# Patient Record
Sex: Male | Born: 1955
Health system: Southern US, Community
[De-identification: ages and names within clinical notes are randomized; demographics above are authoritative.]

## PROBLEM LIST (undated history)

## (undated) DIAGNOSIS — I10 Essential (primary) hypertension: Secondary | ICD-10-CM

## (undated) DIAGNOSIS — E119 Type 2 diabetes mellitus without complications: Secondary | ICD-10-CM

## (undated) DIAGNOSIS — N189 Chronic kidney disease, unspecified: Secondary | ICD-10-CM

## (undated) DIAGNOSIS — J45909 Unspecified asthma, uncomplicated: Secondary | ICD-10-CM

## (undated) DIAGNOSIS — N186 End stage renal disease: Secondary | ICD-10-CM

## (undated) DIAGNOSIS — I509 Heart failure, unspecified: Secondary | ICD-10-CM

## (undated) HISTORY — DX: Chronic kidney disease, unspecified: N18.9

## (undated) HISTORY — PX: COLONOSCOPY: SHX174

---

## 2015-03-22 ENCOUNTER — Other Ambulatory Visit (HOSPITAL_COMMUNITY): Payer: Self-pay

## 2015-03-22 ENCOUNTER — Inpatient Hospital Stay (HOSPITAL_COMMUNITY): Payer: Medicaid Other

## 2015-03-22 ENCOUNTER — Encounter (HOSPITAL_COMMUNITY): Payer: Self-pay | Admitting: Emergency Medicine

## 2015-03-22 ENCOUNTER — Inpatient Hospital Stay (HOSPITAL_COMMUNITY)
Admission: EM | Admit: 2015-03-22 | Discharge: 2015-04-12 | DRG: 264 | Disposition: A | Discharge status: Home Health | Payer: Medicaid Other | Attending: Internal Medicine | Admitting: Internal Medicine

## 2015-03-22 DIAGNOSIS — I5033 Acute on chronic diastolic (congestive) heart failure: Secondary | ICD-10-CM | POA: Insufficient documentation

## 2015-03-22 DIAGNOSIS — E876 Hypokalemia: Secondary | ICD-10-CM | POA: Diagnosis present

## 2015-03-22 DIAGNOSIS — Z23 Encounter for immunization: Secondary | ICD-10-CM

## 2015-03-22 DIAGNOSIS — I10 Essential (primary) hypertension: Secondary | ICD-10-CM | POA: Diagnosis present

## 2015-03-22 DIAGNOSIS — D638 Anemia in other chronic diseases classified elsewhere: Secondary | ICD-10-CM | POA: Diagnosis present

## 2015-03-22 DIAGNOSIS — Z6831 Body mass index (BMI) 31.0-31.9, adult: Secondary | ICD-10-CM | POA: Diagnosis not present

## 2015-03-22 DIAGNOSIS — C9000 Multiple myeloma not having achieved remission: Secondary | ICD-10-CM | POA: Diagnosis present

## 2015-03-22 DIAGNOSIS — N049 Nephrotic syndrome with unspecified morphologic changes: Secondary | ICD-10-CM

## 2015-03-22 DIAGNOSIS — N19 Unspecified kidney failure: Secondary | ICD-10-CM | POA: Diagnosis present

## 2015-03-22 DIAGNOSIS — N269 Renal sclerosis, unspecified: Secondary | ICD-10-CM | POA: Diagnosis present

## 2015-03-22 DIAGNOSIS — I120 Hypertensive chronic kidney disease with stage 5 chronic kidney disease or end stage renal disease: Secondary | ICD-10-CM | POA: Diagnosis present

## 2015-03-22 DIAGNOSIS — N186 End stage renal disease: Secondary | ICD-10-CM | POA: Diagnosis present

## 2015-03-22 DIAGNOSIS — Z79899 Other long term (current) drug therapy: Secondary | ICD-10-CM

## 2015-03-22 DIAGNOSIS — Z794 Long term (current) use of insulin: Secondary | ICD-10-CM | POA: Diagnosis not present

## 2015-03-22 DIAGNOSIS — E1122 Type 2 diabetes mellitus with diabetic chronic kidney disease: Secondary | ICD-10-CM | POA: Diagnosis present

## 2015-03-22 DIAGNOSIS — E1129 Type 2 diabetes mellitus with other diabetic kidney complication: Secondary | ICD-10-CM

## 2015-03-22 DIAGNOSIS — R609 Edema, unspecified: Secondary | ICD-10-CM

## 2015-03-22 DIAGNOSIS — E118 Type 2 diabetes mellitus with unspecified complications: Secondary | ICD-10-CM

## 2015-03-22 DIAGNOSIS — E785 Hyperlipidemia, unspecified: Secondary | ICD-10-CM | POA: Diagnosis present

## 2015-03-22 DIAGNOSIS — J9601 Acute respiratory failure with hypoxia: Secondary | ICD-10-CM | POA: Diagnosis present

## 2015-03-22 DIAGNOSIS — N2581 Secondary hyperparathyroidism of renal origin: Secondary | ICD-10-CM | POA: Diagnosis present

## 2015-03-22 DIAGNOSIS — D509 Iron deficiency anemia, unspecified: Secondary | ICD-10-CM | POA: Diagnosis present

## 2015-03-22 DIAGNOSIS — Z992 Dependence on renal dialysis: Secondary | ICD-10-CM

## 2015-03-22 DIAGNOSIS — D649 Anemia, unspecified: Secondary | ICD-10-CM | POA: Diagnosis present

## 2015-03-22 DIAGNOSIS — N179 Acute kidney failure, unspecified: Secondary | ICD-10-CM | POA: Diagnosis present

## 2015-03-22 DIAGNOSIS — R600 Localized edema: Secondary | ICD-10-CM

## 2015-03-22 DIAGNOSIS — E119 Type 2 diabetes mellitus without complications: Secondary | ICD-10-CM

## 2015-03-22 DIAGNOSIS — R0902 Hypoxemia: Secondary | ICD-10-CM

## 2015-03-22 DIAGNOSIS — I5043 Acute on chronic combined systolic (congestive) and diastolic (congestive) heart failure: Secondary | ICD-10-CM | POA: Diagnosis present

## 2015-03-22 DIAGNOSIS — E11649 Type 2 diabetes mellitus with hypoglycemia without coma: Secondary | ICD-10-CM | POA: Diagnosis present

## 2015-03-22 DIAGNOSIS — IMO0002 Reserved for concepts with insufficient information to code with codable children: Secondary | ICD-10-CM

## 2015-03-22 DIAGNOSIS — I509 Heart failure, unspecified: Secondary | ICD-10-CM

## 2015-03-22 DIAGNOSIS — E1165 Type 2 diabetes mellitus with hyperglycemia: Secondary | ICD-10-CM

## 2015-03-22 HISTORY — DX: Essential (primary) hypertension: I10

## 2015-03-22 HISTORY — DX: Type 2 diabetes mellitus without complications: E11.9

## 2015-03-22 LAB — CBC
HCT: 24.7 % — ABNORMAL LOW (ref 39.0–52.0)
Hemoglobin: 8.1 g/dL — ABNORMAL LOW (ref 13.0–17.0)
MCH: 28.3 pg (ref 26.0–34.0)
MCHC: 32.8 g/dL (ref 30.0–36.0)
MCV: 86.4 fL (ref 78.0–100.0)
Platelets: 310 10*3/uL (ref 150–400)
RBC: 2.86 MIL/uL — AB (ref 4.22–5.81)
RDW: 14.1 % (ref 11.5–15.5)
WBC: 5.6 10*3/uL (ref 4.0–10.5)

## 2015-03-22 LAB — BASIC METABOLIC PANEL
Anion gap: 8 (ref 5–15)
BUN: 27 mg/dL — ABNORMAL HIGH (ref 6–23)
CO2: 26 mmol/L (ref 19–32)
Calcium: 7.2 mg/dL — ABNORMAL LOW (ref 8.4–10.5)
Chloride: 103 mmol/L (ref 96–112)
Creatinine, Ser: 4.38 mg/dL — ABNORMAL HIGH (ref 0.50–1.35)
GFR calc non Af Amer: 14 mL/min — ABNORMAL LOW (ref >90)
GFR, EST AFRICAN AMERICAN: 16 mL/min — AB (ref >90)
GLUCOSE: 157 mg/dL — AB (ref 70–99)
Potassium: 3 mmol/L — ABNORMAL LOW (ref 3.5–5.1)
Sodium: 137 mmol/L (ref 135–145)

## 2015-03-22 LAB — BRAIN NATRIURETIC PEPTIDE: B Natriuretic Peptide: 1043.8 pg/mL — ABNORMAL HIGH (ref 0.0–100.0)

## 2015-03-22 LAB — I-STAT TROPONIN, ED: TROPONIN I, POC: 0.02 ng/mL (ref 0.00–0.08)

## 2015-03-22 MED ORDER — GABAPENTIN 400 MG PO CAPS
400.0000 mg | ORAL_CAPSULE | Freq: Two times a day (BID) | ORAL | Status: DC
  Administered 2015-03-23 – 2015-03-30 (×16): 400 mg via ORAL
  Filled 2015-03-22 (×17): qty 1

## 2015-03-22 MED ORDER — POTASSIUM CHLORIDE CRYS ER 20 MEQ PO TBCR
40.0000 meq | EXTENDED_RELEASE_TABLET | ORAL | Status: AC
  Administered 2015-03-22 – 2015-03-23 (×2): 40 meq via ORAL
  Filled 2015-03-22 (×2): qty 2

## 2015-03-22 MED ORDER — HEPARIN SODIUM (PORCINE) 5000 UNIT/ML IJ SOLN
5000.0000 [IU] | Freq: Three times a day (TID) | INTRAMUSCULAR | Status: AC
  Administered 2015-03-23 – 2015-03-31 (×27): 5000 [IU] via SUBCUTANEOUS
  Filled 2015-03-22 (×27): qty 1

## 2015-03-22 MED ORDER — SODIUM CHLORIDE 0.9 % IJ SOLN
3.0000 mL | Freq: Two times a day (BID) | INTRAMUSCULAR | Status: DC
  Administered 2015-03-23 – 2015-04-09 (×34): 3 mL via INTRAVENOUS

## 2015-03-22 MED ORDER — PNEUMOCOCCAL VAC POLYVALENT 25 MCG/0.5ML IJ INJ
0.5000 mL | INJECTION | INTRAMUSCULAR | Status: AC
  Administered 2015-03-23: 0.5 mL via INTRAMUSCULAR
  Filled 2015-03-22: qty 0.5

## 2015-03-22 MED ORDER — METOPROLOL TARTRATE 25 MG PO TABS
25.0000 mg | ORAL_TABLET | Freq: Two times a day (BID) | ORAL | Status: DC
  Administered 2015-03-23 (×2): 25 mg via ORAL
  Filled 2015-03-22 (×4): qty 1

## 2015-03-22 MED ORDER — HYDRALAZINE HCL 20 MG/ML IJ SOLN
10.0000 mg | INTRAMUSCULAR | Status: DC | PRN
  Administered 2015-03-22 – 2015-03-23 (×3): 10 mg via INTRAVENOUS
  Filled 2015-03-22 (×3): qty 1

## 2015-03-22 MED ORDER — GABAPENTIN 800 MG PO TABS
400.0000 mg | ORAL_TABLET | Freq: Two times a day (BID) | ORAL | Status: DC
  Filled 2015-03-22: qty 0.5

## 2015-03-22 MED ORDER — INSULIN ASPART 100 UNIT/ML ~~LOC~~ SOLN
0.0000 [IU] | Freq: Three times a day (TID) | SUBCUTANEOUS | Status: DC
  Administered 2015-03-23 – 2015-03-24 (×4): 1 [IU] via SUBCUTANEOUS
  Administered 2015-03-24: 2 [IU] via SUBCUTANEOUS
  Administered 2015-03-25 – 2015-03-26 (×2): 1 [IU] via SUBCUTANEOUS
  Administered 2015-03-27 – 2015-03-28 (×2): 2 [IU] via SUBCUTANEOUS
  Administered 2015-03-28: 1 [IU] via SUBCUTANEOUS
  Administered 2015-03-29 (×2): 2 [IU] via SUBCUTANEOUS
  Administered 2015-03-29: 1 [IU] via SUBCUTANEOUS

## 2015-03-22 MED ORDER — ACETAMINOPHEN 325 MG PO TABS: 650.0000 mg | ORAL_TABLET | ORAL | Status: DC | PRN

## 2015-03-22 MED ORDER — SODIUM CHLORIDE 0.9 % IV SOLN
250.0000 mL | INTRAVENOUS | Status: DC | PRN
  Administered 2015-03-23: 250 mL via INTRAVENOUS

## 2015-03-22 MED ORDER — FUROSEMIDE 10 MG/ML IJ SOLN
40.0000 mg | Freq: Two times a day (BID) | INTRAMUSCULAR | Status: DC
  Administered 2015-03-22 – 2015-03-23 (×2): 40 mg via INTRAVENOUS
  Filled 2015-03-22 (×3): qty 4

## 2015-03-22 MED ORDER — INSULIN DETEMIR 100 UNIT/ML ~~LOC~~ SOLN
15.0000 [IU] | Freq: Two times a day (BID) | SUBCUTANEOUS | Status: DC
  Filled 2015-03-22 (×3): qty 0.15

## 2015-03-22 MED ORDER — SODIUM CHLORIDE 0.9 % IJ SOLN: 3.0000 mL | INTRAMUSCULAR | Status: DC | PRN

## 2015-03-22 MED ORDER — ONDANSETRON HCL 4 MG/2ML IJ SOLN: 4.0000 mg | Freq: Four times a day (QID) | INTRAMUSCULAR | Status: DC | PRN

## 2015-03-22 NOTE — ED Notes (Signed)
Attempted report 

## 2015-03-22 NOTE — ED Provider Notes (Signed)
CSN: [id number]     Arrival date & time 03/22/15  1835 History   None    Chief Complaint  Patient presents with  . Groin Swelling   (Consider location/radiation/quality/duration/timing/severity/associated sxs/prior Treatment) Patient is a 59 y.o. male presenting with male genitourinary complaint. The history is provided by the patient. No language interpreter was used.  Male GU Problem Presenting symptoms: scrotal pain   Presenting symptoms: no penile pain   Context: spontaneously   Relieved by:  Nothing Worsened by:  Activity Ineffective treatments:  None tried Associated symptoms: scrotal swelling and urinary frequency (since starting lasix)   Associated symptoms: no abdominal pain, no diarrhea, no fever, no flank pain, no hematuria, no nausea, no penile swelling, no urinary incontinence, no urinary retention and no vomiting   Risk factors: change in medication (started on lasix)   Risk factors: no kidney stones     Past Medical History  Diagnosis Date  . Hypertension   . Diabetes mellitus without complication    Past Surgical History  Procedure Laterality Date  . Colonoscopy     No family history on file. History  Substance Use Topics  . Smoking status: Never Smoker   . Smokeless tobacco: Not on file  . Alcohol Use: No    Review of Systems  Constitutional: Positive for fatigue. Negative for fever and diaphoresis.  Respiratory: Positive for shortness of breath (occasional on exertion). Negative for chest tightness.   Cardiovascular: Positive for leg swelling. Negative for chest pain and palpitations.  Gastrointestinal: Negative for nausea, vomiting, abdominal pain and diarrhea.  Genitourinary: Positive for frequency (since starting lasix), scrotal swelling and testicular pain. Negative for bladder incontinence, hematuria, flank pain, penile swelling, difficulty urinating and penile pain.  Neurological: Negative for light-headedness and headaches.   Psychiatric/Behavioral: Negative for confusion.  All other systems reviewed and are negative.     Allergies  Review of patient's allergies indicates no known allergies.  Home Medications   Prior to Admission medications   Not on File   BP 169/110 mmHg  Pulse 83  Temp(Src) 98.7 F (37.1 C) (Oral)  Resp 18  Ht 5\' 5"  (1.651 m)  Wt 256 lb 9 oz (116.376 kg)  BMI 42.69 kg/m2  SpO2 93% Physical Exam  Constitutional: He is oriented to person, place, and time. He appears well-developed and well-nourished. No distress.  HENT:  Head: Normocephalic and atraumatic.  Nose: Nose normal.  Mouth/Throat: Oropharynx is clear and moist. No oropharyngeal exudate.  Eyes: EOM are normal. Pupils are equal, round, and reactive to light.  Neck: Normal range of motion. Neck supple.  Cardiovascular: Normal rate, regular rhythm, normal heart sounds and intact distal pulses.   No murmur heard. Pulmonary/Chest: Effort normal and breath sounds normal. No respiratory distress. He has no wheezes. He exhibits no tenderness.  Decreased breath sounds 2/2 body habitus.   Abdominal: Soft. He exhibits no distension. There is no tenderness. There is no guarding.  Genitourinary:  Significantly enlarged/edematous bilateral scrotum.  Slightly tender to palpation diffusely, relieves with elevation.  Swelling covers the penis, but able to visualize the urethra with manipulation.  No lesions noted  Musculoskeletal: Normal range of motion. He exhibits edema (2+ pitting peripheral edema extending to scrotum ). He exhibits no tenderness.  Neurological: He is alert and oriented to person, place, and time. No cranial nerve deficit. Coordination normal.  Skin: Skin is warm and dry. He is not diaphoretic. No pallor.  Psychiatric: He has a normal mood and affect. His behavior  is normal. Judgment and thought content normal.  Nursing note and vitals reviewed.   ED Course  Procedures (including critical care time) Labs  Review Labs Reviewed  CBC - Abnormal; Notable for the following:    RBC 2.86 (*)    Hemoglobin 8.1 (*)    HCT 24.7 (*)    All other components within normal limits  BASIC METABOLIC PANEL - Abnormal; Notable for the following:    Potassium 3.0 (*)    Glucose, Bld 157 (*)    BUN 27 (*)    Creatinine, Ser 4.38 (*)    Calcium 7.2 (*)    GFR calc non Af Amer 14 (*)    GFR calc Af Amer 16 (*)    All other components within normal limits  BRAIN NATRIURETIC PEPTIDE - Abnormal; Notable for the following:    B Natriuretic Peptide 1043.8 (*)    All other components within normal limits  URINALYSIS, ROUTINE W REFLEX MICROSCOPIC - Abnormal; Notable for the following:    Color, Urine STRAW (*)    Hgb urine dipstick MODERATE (*)    Protein, ur 100 (*)    All other components within normal limits  GLUCOSE, CAPILLARY - Abnormal; Notable for the following:    Glucose-Capillary 57 (*)    All other components within normal limits  GLUCOSE, CAPILLARY - Abnormal; Notable for the following:    Glucose-Capillary 104 (*)    All other components within normal limits  URINE MICROSCOPIC-ADD ON - Abnormal; Notable for the following:    Squamous Epithelial / LPF FEW (*)    Bacteria, UA FEW (*)    Casts HYALINE CASTS (*)    All other components within normal limits  BASIC METABOLIC PANEL  HEMOGLOBIN A1C  HEPATIC FUNCTION PANEL  VITAMIN B12  FOLATE  IRON AND TIBC  FERRITIN  RETICULOCYTES  I-STAT TROPOININ, ED   Imaging Review Dg Chest 2 View  03/22/2015   CLINICAL DATA:  Hypoxia.  Hypertension and diabetes.  EXAM: CHEST  2 VIEW  COMPARISON:  None.  FINDINGS: There is mild cardiomegaly and vascular pedicle widening. Diffuse interstitial opacity with Kerley lines. Small bilateral pleural effusions with atelectasis.  IMPRESSION: CHF.   Electronically Signed   By: Monte Fantasia M.D.   On: 03/22/2015 22:51     EKG Interpretation None      MDM   Final diagnoses:  Renal failure  Hypoxia   Scrotal edema  Pt is a 59 yo M with hx of HTN, HLD, and DM who presents with scrotal pain.  Reports he has had gradually worsening bilateral leg and scrotal edema for the past few weeks.  Was seen at [facility] 2 weeks ago and was started on 20 mg lasix q day. He has been compliant with this but still has had worsening edema.  Reports he was never diagnosed with CHF, but doesn't see doctors often. Occasionally has SOB on exertion but denies acute SOB or chest pain.  Patient is morbidly obese.  Bilateral lower extremities with 2+ pitting edema.  Scrotum is the size of a mellon, diffusely tender.  Barely able to see the penis due to swelling, but still able to urinate per pt.  No lesions noted.  O2 sats in low 90s on room air but dropped into 80s occasionally, so placed on 2L O2 by [place].  Labs, EKG, and CXR were sent.   Labs returned showing no leukocytosis, mild anemia (8).  Cr 4.38.  BNP 1043 and trop  negative.  Unfortunately patient has never been seen in our system before so unable to compare his baseline.  Likely has chronic kidney disease, but is in overall poor shape with these results.   CXR with fluid overload, consistent with CHF exacerbation.   ED ECG REPORT   Date: 03/23/2015  Rate: 80  Rhythm: normal sinus rhythm  QRS Axis: normal  Intervals: normal  ST/T Wave abnormalities: normal  Conduction Disutrbances:none  Narrative Interpretation: NSR, benign EKG  Old EKG Reviewed: none available  I have personally reviewed the EKG tracing and agree with the computerized printout as noted.   Called for admission for renal failure, CHF exacerbation, and scrotal edema.  Will go to hospitalist as he is unassigned.  Floor appropriate.   Patient was seen with ED Attending, Dr. Joette Catching, MD   Tori Milks, MD 03/23/15 BP:[id number]  Virgel Manifold, MD 03/25/15 [phone]

## 2015-03-22 NOTE — ED Notes (Signed)
Attempted Report 

## 2015-03-22 NOTE — ED Notes (Signed)
Pt. Placed on 2L O2 due to oxygen saturation at 88%. Oxygen sat 96% on 2L

## 2015-03-22 NOTE — ED Notes (Signed)
Pt c/o groin swelling x 2 weeks. Pt seen at [facility] and given fluid pills but not getting any better.  Pt also reports that abdomen and legs are more swollen than usual, making it difficult to walk.

## 2015-03-22 NOTE — H&P (Signed)
History and Physical    Jadence Butterly ZOX:[id number] DOB: 04/21/1956 03/22/2015  Referring physician: Dr. Delford Field PCP: No primary care provider on file.  Specialists: none  Chief Complaint: scrotal and LE swelling  HPI: Detavius Bloxsom is a 59 y.o. male with a history of diabetes, hypertension, that usually gets his care at a clinic in [facility], presents to the ED with chief complaint of LE swelling as well as scrotal swelling. He denies history of heart failure. He endorses LE swelling ongoing and progressive for the past 4 weeks. He saw his PCP about 2 weeks ago and he was started on Lasix, however despite taking it has not noticed any improvement, in fact, he thinks his swelling got worse and decided to come to ED. He denies chest pain, denies abdominal pain, nausea/vomiting or diarrhea. He denies fevers or chills. He endorses dyspnea with ambulation which has progressively worsened. Endorses difficulties with laying flat due to dyspnea. He denies cough/chest congestion. He endorses weight gain however is unable to quantify how much. He reports good urine output. In the ED, patient was found to be in renal failure with Cr of 4, hypoxic on room air requiring 2L [place], has an elevated BNP to ~1000 and anemia.   Review of Systems: as per HPI otherwise 10 point ROS negative.   Past Medical History  Diagnosis Date  . Hypertension   . Diabetes mellitus without complication    Past Surgical History  Procedure Laterality Date  . Colonoscopy     Social History:  reports that he has never smoked. He does not have any smokeless tobacco history on file. He reports that he does not drink alcohol or use illicit drugs.  No Known Allergies  Family history negative for heart disease, heart failure, MIs  Prior to Admission medications   Medication Sig Start Date End Date Taking? Authorizing Provider  furosemide (LASIX) 20 MG tablet Take 20 mg by mouth daily.   Yes Historical Provider, MD    gabapentin (NEURONTIN) 800 MG tablet Take 400 mg by mouth 2 (two) times daily.   Yes Historical Provider, MD  insulin detemir (LEVEMIR) 100 UNIT/ML injection Inject 20 Units into the skin 2 (two) times daily.   Yes Historical Provider, MD  lisinopril (PRINIVIL,ZESTRIL) 20 MG tablet Take 20 mg by mouth daily.   Yes Historical Provider, MD  metoprolol tartrate (LOPRESSOR) 25 MG tablet Take 25 mg by mouth 2 (two) times daily.   Yes Historical Provider, MD   Physical Exam: Filed Vitals:   03/22/15 2000 03/22/15 2015 03/22/15 2045 03/22/15 2100  BP: 179/90 181/101 186/110 160/93  Pulse: 81 85 85 87  Temp:      TempSrc:      Resp:      Height:      Weight:      SpO2: 92% 96% 94% 96%     General:  No apparent distress  Eyes: PERRL, EOMI, no scleral icterus  ENT: moist oropharynx  Neck: supple, no lymphadenopathy  Cardiovascular: regular rate without MRG; 2+ peripheral pulses, + JVD, 2+ pitting peripheral edema  Respiratory: good air movement, no wheezing, bibasilar crackles  Abdomen: soft, non tender to palpation, positive bowel sounds, no guarding, no rebound  Skin: no rashes  Musculoskeletal: normal bulk and tone, no joint swelling  Psychiatric: normal mood and affect  Neurologic: non focal   Labs on Admission:  Basic Metabolic Panel:  Recent Labs Lab 03/22/15 1903  NA 137  K 3.0*  CL 103  CO2 26  GLUCOSE 157*  BUN 27*  CREATININE 4.38*  CALCIUM 7.2*   CBC:  Recent Labs Lab 03/22/15 1903  WBC 5.6  HGB 8.1*  HCT 24.7*  MCV 86.4  PLT 310   BNP (last 3 results)  Recent Labs  03/22/15 1903  BNP 1043.8*    Radiological Exams on Admission: No results found.  EKG: Independently reviewed. Sinus rhythm  Assessment/Plan Principal Problem:   CHF (congestive heart failure) Active Problems:   Peripheral edema   HTN (hypertension)   DM (diabetes mellitus)   Renal failure   Anemia   CHF exacerbation with LE swelling / scrotal swelling - will  admit on heart failure pathway, patient with LE swelling, JVD, crackles on exam and clinically in heart failure. Will obtain a 2D echo, no prior echos in the system, patient denies ever having a 2D echo done.  - daily weights - strict I&Os - Lasix 40 mg BID, monitor daily BMPs - obtain CXR, not done in the ED  Hypoxic respiratory failure - due to #1, oxygen as needed, Lasix  Renal failure - unclear baseline, patient denies prior knowledge of kidney problems, so not sure if he has underlying CKD. WIll obtain UA/Renal US. Has malignant HTN, DM, ?CHF which are good reasons for underlying CKD.  - closely monitor with diuresis, makes good urine, BUN not that high  DM - unclear control, he is on Levemir 20 U BID, will assume that renal failure is new / worse, will decrease his Levemir to 15 BID, place on carb modified diet, add SSI. Obtain A1C.  HTN - hold Lisinopril, start his home Metoprolol, IV Lasix as above, IV hydralazine PRN. Will definitely need additional oral agents, wait on echo for now.   Anemia - unclear baseline, likely due to chronic disease, dilutional component likely as well given significant fluid overload.  - obtain anemia panel    Diet: heart healthy/carb modified/low salt Fluids: none DVT Prophylaxis: heparin  Code Status: Full  Family Communication: d/w family bedside  Disposition Plan: admit to telemetry    Costin M. Elvera Lennox, MD [facility] Pager [phone]  If 7PM-7AM, please contact night-coverage [email or web address] Password [facility] 03/22/2015, 9:40 PM

## 2015-03-23 DIAGNOSIS — E876 Hypokalemia: Secondary | ICD-10-CM

## 2015-03-23 DIAGNOSIS — I10 Essential (primary) hypertension: Secondary | ICD-10-CM

## 2015-03-23 DIAGNOSIS — R609 Edema, unspecified: Secondary | ICD-10-CM

## 2015-03-23 LAB — IRON AND TIBC
IRON: 18 ug/dL — AB (ref 42–165)
Saturation Ratios: 11 % — ABNORMAL LOW (ref 20–55)
TIBC: 166 ug/dL — ABNORMAL LOW (ref 215–435)
UIBC: 148 ug/dL (ref 125–400)

## 2015-03-23 LAB — RETICULOCYTES
RBC.: 3.18 MIL/uL — AB (ref 4.22–5.81)
RETIC COUNT ABSOLUTE: 50.9 10*3/uL (ref 19.0–186.0)
Retic Ct Pct: 1.6 % (ref 0.4–3.1)

## 2015-03-23 LAB — BASIC METABOLIC PANEL
Anion gap: 11 (ref 5–15)
BUN: 26 mg/dL — ABNORMAL HIGH (ref 6–23)
CALCIUM: 7.5 mg/dL — AB (ref 8.4–10.5)
CO2: 27 mmol/L (ref 19–32)
CREATININE: 4.48 mg/dL — AB (ref 0.50–1.35)
Chloride: 103 mmol/L (ref 96–112)
GFR calc Af Amer: 15 mL/min — ABNORMAL LOW (ref >90)
GFR, EST NON AFRICAN AMERICAN: 13 mL/min — AB (ref >90)
Glucose, Bld: 114 mg/dL — ABNORMAL HIGH (ref 70–99)
POTASSIUM: 3.3 mmol/L — AB (ref 3.5–5.1)
Sodium: 141 mmol/L (ref 135–145)

## 2015-03-23 LAB — TSH: TSH: 1.254 u[IU]/mL (ref 0.350–4.500)

## 2015-03-23 LAB — URINALYSIS, ROUTINE W REFLEX MICROSCOPIC
BILIRUBIN URINE: NEGATIVE
Glucose, UA: NEGATIVE mg/dL
Ketones, ur: NEGATIVE mg/dL
Leukocytes, UA: NEGATIVE
Nitrite: NEGATIVE
Protein, ur: 100 mg/dL — AB
SPECIFIC GRAVITY, URINE: 1.009 (ref 1.005–1.030)
Urobilinogen, UA: 0.2 mg/dL (ref 0.0–1.0)
pH: 6.5 (ref 5.0–8.0)

## 2015-03-23 LAB — FOLATE: FOLATE: 6.1 ng/mL

## 2015-03-23 LAB — HEPATIC FUNCTION PANEL
ALT: 23 U/L (ref 0–53)
AST: 29 U/L (ref 0–37)
Albumin: 1.7 g/dL — ABNORMAL LOW (ref 3.5–5.2)
Alkaline Phosphatase: 159 U/L — ABNORMAL HIGH (ref 39–117)
Bilirubin, Direct: <0.1 mg/dL (ref 0.0–0.5)
Total Bilirubin: 0.6 mg/dL (ref 0.3–1.2)
Total Protein: 7.1 g/dL (ref 6.0–8.3)

## 2015-03-23 LAB — URINE MICROSCOPIC-ADD ON

## 2015-03-23 LAB — GLUCOSE, CAPILLARY
GLUCOSE-CAPILLARY: 136 mg/dL — AB (ref 70–99)
Glucose-Capillary: 104 mg/dL — ABNORMAL HIGH (ref 70–99)
Glucose-Capillary: 144 mg/dL — ABNORMAL HIGH (ref 70–99)
Glucose-Capillary: 181 mg/dL — ABNORMAL HIGH (ref 70–99)
Glucose-Capillary: 57 mg/dL — ABNORMAL LOW (ref 70–99)
Glucose-Capillary: 78 mg/dL (ref 70–99)

## 2015-03-23 LAB — VITAMIN B12: Vitamin B-12: 450 pg/mL (ref 211–911)

## 2015-03-23 LAB — MAGNESIUM: Magnesium: 1.6 mg/dL (ref 1.5–2.5)

## 2015-03-23 LAB — FERRITIN: Ferritin: 306 ng/mL (ref 22–322)

## 2015-03-23 MED ORDER — INSULIN DETEMIR 100 UNIT/ML ~~LOC~~ SOLN
10.0000 [IU] | Freq: Every day | SUBCUTANEOUS | Status: DC
  Filled 2015-03-23 (×2): qty 0.1

## 2015-03-23 MED ORDER — FUROSEMIDE 10 MG/ML IJ SOLN
80.0000 mg | Freq: Two times a day (BID) | INTRAMUSCULAR | Status: DC
  Administered 2015-03-23 – 2015-03-28 (×11): 80 mg via INTRAVENOUS
  Filled 2015-03-23 (×13): qty 8

## 2015-03-23 MED ORDER — POTASSIUM CHLORIDE CRYS ER 20 MEQ PO TBCR
40.0000 meq | EXTENDED_RELEASE_TABLET | Freq: Two times a day (BID) | ORAL | Status: AC
  Administered 2015-03-23 (×2): 40 meq via ORAL
  Filled 2015-03-23 (×2): qty 2

## 2015-03-23 MED ORDER — METOPROLOL TARTRATE 50 MG PO TABS
50.0000 mg | ORAL_TABLET | Freq: Two times a day (BID) | ORAL | Status: DC
  Administered 2015-03-23 – 2015-03-26 (×6): 50 mg via ORAL
  Filled 2015-03-23 (×7): qty 1

## 2015-03-23 MED ORDER — MAGNESIUM SULFATE 2 GM/50ML IV SOLN
2.0000 g | Freq: Once | INTRAVENOUS | Status: AC
  Administered 2015-03-23: 2 g via INTRAVENOUS
  Filled 2015-03-23 (×2): qty 50

## 2015-03-23 NOTE — Progress Notes (Signed)
[facility] PROGRESS NOTE  Aban Zajicek LKG:[id number] DOB: 07-17-1956 DOA: 03/22/2015 PCP: No primary care provider on file.  Assessment/Plan: 1. Acutely decompensated CHF exacerbation, unknown if systolic vs diastolic 1. Wt currently 111.7kg<<<116.3kg 2. Thus far net neg 925cc 3. Pt remains clinically volume overloaded 4. Pt has reported feeling better 5. 2d echo pending 6. TSH pending 7. Will increase lasix to 80mg  IV bid 2. Hypoxic respiratory failure 1. Secondary to above 2. Presently on min O2 support 3. ARF 1. Unclear etiology 2. Diuresing patient as he is clinically volume overloaded 3. Nearly 2L urine output since admission documented 4. DM2 1. Pt noted to be hypoglycemic in the setting of renal failure 2. Will decrease lantus to 10units QHS from 20 units BID 5. HTN 1. Poorly controlled 2. Increased diuretic dose 3. Increase metoprolol to 50mg  bid 6. Anemia 1. Iron deficient 2. Will follow cbc 7. Hypokalemia 1. Replace 8. Hypomagnesemia 1. Replace 9. DVT prophylaxis 1. Heparin subQ  Code Status: Full Family Communication: Pt in room (indicate person spoken with, relationship, and if by phone, the number) Disposition Plan: Pending   Consultants:    Procedures:    Antibiotics:   (indicate start date, and stop date if known)  HPI/Subjective: States feeling better  Objective: Filed Vitals:   03/23/15 0337 03/23/15 0618 03/23/15 1049 03/23/15 1142  BP: 177/90 184/98 181/98 178/90  Pulse: 85 88 92 96  Temp: 98.5 F (36.9 C)     TempSrc: Oral     Resp: 20     Height:      Weight: 111.766 kg (246 lb 6.4 oz)     SpO2: 95%       Intake/Output Summary (Last 24 hours) at 03/23/15 1400 Last data filed at 03/23/15 1102  Gross per 24 hour  Intake    900 ml  Output   1825 ml  Net   -925 ml   Filed Weights   03/22/15 1906 03/22/15 2322 03/23/15 0337  Weight: 116.376 kg (256 lb 9 oz) 111.721 kg (246 lb 4.8 oz) 111.766 kg (246 lb 6.4 oz)     Exam:   General:  Awake, in nad  Cardiovascular: regular, s1, s2  Respiratory: normal resp effort, no wheezing  Abdomen: soft, obese, nondistended  Musculoskeletal: 2-3 + pitting LE edema B with scrotal edema   Data Reviewed: Basic Metabolic Panel:  Recent Labs Lab 03/22/15 1903 03/23/15 0330 03/23/15 0727  NA 137 141  --   K 3.0* 3.3*  --   CL 103 103  --   CO2 26 27  --   GLUCOSE 157* 114*  --   BUN 27* 26*  --   CREATININE 4.38* 4.48*  --   CALCIUM 7.2* 7.5*  --   MG  --   --  1.6   Liver Function Tests:  Recent Labs Lab 03/23/15 0330  AST 29  ALT 23  ALKPHOS 159*  BILITOT 0.6  PROT 7.1  ALBUMIN 1.7*   No results for input(s): LIPASE, AMYLASE in the last 168 hours. No results for input(s): AMMONIA in the last 168 hours. CBC:  Recent Labs Lab 03/22/15 1903  WBC 5.6  HGB 8.1*  HCT 24.7*  MCV 86.4  PLT 310   Cardiac Enzymes: No results for input(s): CKTOTAL, CKMB, CKMBINDEX, TROPONINI in the last 168 hours. BNP (last 3 results)  Recent Labs  03/22/15 1903  BNP 1043.8*    ProBNP (last 3 results) No results for input(s): PROBNP in the last  8760 hours.  CBG:  Recent Labs Lab 03/22/15 2332 03/23/15 0003 03/23/15 0621 03/23/15 1118  GLUCAP 57* 104* 78 136*    No results found for this or any previous visit (from the past 240 hour(s)).   Studies: Dg Chest 2 View  03/22/2015   CLINICAL DATA:  Hypoxia.  Hypertension and diabetes.  EXAM: CHEST  2 VIEW  COMPARISON:  None.  FINDINGS: There is mild cardiomegaly and vascular pedicle widening. Diffuse interstitial opacity with Kerley lines. Small bilateral pleural effusions with atelectasis.  IMPRESSION: CHF.   Electronically Signed   By: Marnee Spring M.D.   On: 03/22/2015 22:51   US Renal  03/22/2015   CLINICAL DATA:  Renal failure.  Abdominal swelling.  Leg swelling.  EXAM: RENAL/URINARY TRACT ULTRASOUND COMPLETE  COMPARISON:  None.  FINDINGS: Right Kidney:  Length: 13.5 cm.  Echogenicity within normal limits. No mass or hydronephrosis visualized.  Left Kidney:  Length: 12.1 cm. Echogenicity within normal limits. There is a 1.4 cm cyst in the interpolar left kidney. No solid mass or hydronephrosis visualized.  Bladder:  Appears normal for degree of bladder distention.  Incidental:  Bilateral pleural effusions noted.  IMPRESSION: 1. Mild asymmetry in renal size, with right kidney mildly enlarged measuring 13.5 cm. No hydronephrosis or localizing abnormality. This may be related to mild renal hypertrophy. 2. Simple cyst in the interpolar left kidney, left kidney is otherwise normal. 3. Bilateral pleural effusions, incidentally noted.   Electronically Signed   By: Rubye Oaks M.D.   On: 03/22/2015 22:45    Scheduled Meds: . furosemide  80 mg Intravenous BID  . gabapentin  400 mg Oral BID  . heparin  5,000 Units Subcutaneous 3 times per day  . insulin aspart  0-9 Units Subcutaneous TID WC  . [START ON 03/24/2015] insulin detemir  10 Units Subcutaneous QHS  . magnesium sulfate 1 - 4 g bolus IVPB  2 g Intravenous Once  . metoprolol tartrate  50 mg Oral BID  . potassium chloride  40 mEq Oral BID  . sodium chloride  3 mL Intravenous Q12H   Continuous Infusions:   Principal Problem:   CHF (congestive heart failure) Active Problems:   Peripheral edema   HTN (hypertension)   DM (diabetes mellitus)   Renal failure   Anemia   Hypokalemia   CHIU, STEPHEN K  [facility] Pager [phone]. If 7PM-7AM, please contact night-coverage at [email or web address], password [facility] 03/23/2015, 2:00 PM  LOS: 1 day

## 2015-03-23 NOTE — Progress Notes (Signed)
CBG 57, pt did not eat in ED, gave sandwich, OJ, and graham crackers, rechecked CBG 104, will continue to monitor, thanks Arvella Nigh RN

## 2015-03-23 NOTE — Progress Notes (Signed)
  Echocardiogram 2D Echocardiogram has been performed.  James [name] 03/23/2015, 3:01 PM

## 2015-03-24 DIAGNOSIS — I5041 Acute combined systolic (congestive) and diastolic (congestive) heart failure: Secondary | ICD-10-CM

## 2015-03-24 DIAGNOSIS — I5043 Acute on chronic combined systolic (congestive) and diastolic (congestive) heart failure: Principal | ICD-10-CM

## 2015-03-24 LAB — GLUCOSE, CAPILLARY
GLUCOSE-CAPILLARY: 182 mg/dL — AB (ref 70–99)
Glucose-Capillary: 132 mg/dL — ABNORMAL HIGH (ref 70–99)
Glucose-Capillary: 134 mg/dL — ABNORMAL HIGH (ref 70–99)
Glucose-Capillary: 213 mg/dL — ABNORMAL HIGH (ref 70–99)

## 2015-03-24 LAB — BASIC METABOLIC PANEL
Anion gap: 9 (ref 5–15)
BUN: 27 mg/dL — ABNORMAL HIGH (ref 6–23)
CHLORIDE: 105 mmol/L (ref 96–112)
CO2: 28 mmol/L (ref 19–32)
Calcium: 7.5 mg/dL — ABNORMAL LOW (ref 8.4–10.5)
Creatinine, Ser: 4.54 mg/dL — ABNORMAL HIGH (ref 0.50–1.35)
GFR calc Af Amer: 15 mL/min — ABNORMAL LOW (ref >90)
GFR calc non Af Amer: 13 mL/min — ABNORMAL LOW (ref >90)
GLUCOSE: 136 mg/dL — AB (ref 70–99)
POTASSIUM: 4.3 mmol/L (ref 3.5–5.1)
SODIUM: 142 mmol/L (ref 135–145)

## 2015-03-24 LAB — MAGNESIUM: MAGNESIUM: 1.9 mg/dL (ref 1.5–2.5)

## 2015-03-24 LAB — HEMOGLOBIN A1C
Hgb A1c MFr Bld: 7.2 % — ABNORMAL HIGH (ref 4.8–5.6)
Mean Plasma Glucose: 160 mg/dL

## 2015-03-24 MED ORDER — HYDRALAZINE HCL 25 MG PO TABS
25.0000 mg | ORAL_TABLET | Freq: Three times a day (TID) | ORAL | Status: DC
  Administered 2015-03-24 – 2015-03-26 (×6): 25 mg via ORAL
  Filled 2015-03-24 (×8): qty 1

## 2015-03-24 MED ORDER — ISOSORBIDE MONONITRATE ER 30 MG PO TB24
30.0000 mg | ORAL_TABLET | Freq: Every day | ORAL | Status: DC
  Administered 2015-03-24 – 2015-03-31 (×8): 30 mg via ORAL
  Filled 2015-03-24 (×8): qty 1

## 2015-03-24 MED ORDER — INSULIN DETEMIR 100 UNIT/ML ~~LOC~~ SOLN
12.0000 [IU] | Freq: Every day | SUBCUTANEOUS | Status: DC
  Administered 2015-03-24 – 2015-04-07 (×15): 12 [IU] via SUBCUTANEOUS
  Filled 2015-03-24 (×16): qty 0.12

## 2015-03-24 NOTE — Progress Notes (Signed)
[facility] PROGRESS NOTE  James [name] NFA:[id number] DOB: 12/26/1955 DOA: 03/22/2015 PCP: No primary care provider on file.  Assessment/Plan: 1. Acutely decompensated systolic/diastolic CHF exacerbation 1. Wt currently 110.8kg<<<116.3kg 2. Thus far net neg 2.47L since admit 3. Pt remains clinically volume overloaded with marked anasarca 4. Pt has reported feeling better to day with improved orthopnea and decreased LE swelling 5. 2d echo with EF 45-50% with LV diastolic dysfuntion 6. TSH normal 7. Tolerating lasix at 80mg  IV bid 2. Hypoxic respiratory failure 1. Secondary to above 2. Presently on min O2 support 3. ARF 1. Unclear etiology 2. Diuresing patient as he is clinically volume overloaded 3. Continues with good urine output with lasix 4. Continue to monitor renal function closely 4. DM2 1. Initially noted to be hypoglycemic in the setting of renal failure 2. Decreased lantus to 10units QHS from 20 units BID 3. Glucose ranges from 130-180's 4. Will increase levemir  to 12 units 5. Cont SSI coverage 5. HTN 1. Poorly controlled 2. Increased diuretic dose 3. Increased metoprolol to 50mg  bid 4. Isosorbide and hydralazine added per Cardiology 6. Anemia 1. Iron deficient 2. Will follow cbc 7. Hypokalemia 1. Replaced 8. Hypomagnesemia 1. Replaced 9. DVT prophylaxis 1. Heparin subQ  Code Status: Full Family Communication: Pt in room, wife and daughter at bedside Disposition Plan: Pending   Consultants:  Cardiology  Procedures:    Antibiotics:   (indicate start date, and stop date if known)  HPI/Subjective: Feels better today. Less SOB   Objective: Filed Vitals:   03/23/15 2056 03/24/15 0611 03/24/15 0612 03/24/15 1033  BP: 175/95 180/91 171/90 165/97  Pulse: 94 94  89  Temp: 98.6 F (37 C) 99.3 F (37.4 C)    TempSrc: Oral Oral    Resp: 18 16    Height:      Weight:  110.859 kg (244 lb 6.4 oz)    SpO2: 98% 97%      Intake/Output  Summary (Last 24 hours) at 03/24/15 1338 Last data filed at 03/24/15 1328  Gross per 24 hour  Intake 1204.67 ml  Output   2450 ml  Net -1245.33 ml   Filed Weights   03/22/15 2322 03/23/15 0337 03/24/15 0611  Weight: 111.721 kg (246 lb 4.8 oz) 111.766 kg (246 lb 6.4 oz) 110.859 kg (244 lb 6.4 oz)    Exam:   General:  Awake, in nad  Cardiovascular: regular, s1, s2  Respiratory: normal resp effort, no wheezing  Abdomen: soft, obese, nondistended, pos BS  Musculoskeletal: 2 + pitting LE edema B with scrotal edema that appears improved  Data Reviewed: Basic Metabolic Panel:  Recent Labs Lab 03/22/15 1903 03/23/15 0330 03/23/15 0727 03/24/15 0411 03/24/15 0800  NA 137 141  --  142  --   K 3.0* 3.3*  --  4.3  --   CL 103 103  --  105  --   CO2 26 27  --  28  --   GLUCOSE 157* 114*  --  136*  --   BUN 27* 26*  --  27*  --   CREATININE 4.38* 4.48*  --  4.54*  --   CALCIUM 7.2* 7.5*  --  7.5*  --   MG  --   --  1.6  --  1.9   Liver Function Tests:  Recent Labs Lab 03/23/15 0330  AST 29  ALT 23  ALKPHOS 159*  BILITOT 0.6  PROT 7.1  ALBUMIN 1.7*   No results for input(s):  LIPASE, AMYLASE in the last 168 hours. No results for input(s): AMMONIA in the last 168 hours. CBC:  Recent Labs Lab 03/22/15 1903  WBC 5.6  HGB 8.1*  HCT 24.7*  MCV 86.4  PLT 310   Cardiac Enzymes: No results for input(s): CKTOTAL, CKMB, CKMBINDEX, TROPONINI in the last 168 hours. BNP (last 3 results)  Recent Labs  03/22/15 1903  BNP 1043.8*    ProBNP (last 3 results) No results for input(s): PROBNP in the last 8760 hours.  CBG:  Recent Labs Lab 03/23/15 1118 03/23/15 1625 03/23/15 2056 03/24/15 0608 03/24/15 1128  GLUCAP 136* 144* 181* 132* 182*    No results found for this or any previous visit (from the past 240 hour(s)).   Studies: Dg Chest 2 View  03/22/2015   CLINICAL DATA:  Hypoxia.  Hypertension and diabetes.  EXAM: CHEST  2 VIEW  COMPARISON:  None.   FINDINGS: There is mild cardiomegaly and vascular pedicle widening. Diffuse interstitial opacity with Kerley lines. Small bilateral pleural effusions with atelectasis.  IMPRESSION: CHF.   Electronically Signed   By: Marnee Spring M.D.   On: 03/22/2015 22:51   US Renal  03/22/2015   CLINICAL DATA:  Renal failure.  Abdominal swelling.  Leg swelling.  EXAM: RENAL/URINARY TRACT ULTRASOUND COMPLETE  COMPARISON:  None.  FINDINGS: Right Kidney:  Length: 13.5 cm. Echogenicity within normal limits. No mass or hydronephrosis visualized.  Left Kidney:  Length: 12.1 cm. Echogenicity within normal limits. There is a 1.4 cm cyst in the interpolar left kidney. No solid mass or hydronephrosis visualized.  Bladder:  Appears normal for degree of bladder distention.  Incidental:  Bilateral pleural effusions noted.  IMPRESSION: 1. Mild asymmetry in renal size, with right kidney mildly enlarged measuring 13.5 cm. No hydronephrosis or localizing abnormality. This may be related to mild renal hypertrophy. 2. Simple cyst in the interpolar left kidney, left kidney is otherwise normal. 3. Bilateral pleural effusions, incidentally noted.   Electronically Signed   By: Rubye Oaks M.D.   On: 03/22/2015 22:45    Scheduled Meds: . furosemide  80 mg Intravenous BID  . gabapentin  400 mg Oral BID  . heparin  5,000 Units Subcutaneous 3 times per day  . hydrALAZINE  25 mg Oral TID  . insulin aspart  0-9 Units Subcutaneous TID WC  . insulin detemir  12 Units Subcutaneous QHS  . isosorbide mononitrate  30 mg Oral Daily  . metoprolol tartrate  50 mg Oral BID  . sodium chloride  3 mL Intravenous Q12H   Continuous Infusions:   Principal Problem:   CHF (congestive heart failure) Active Problems:   Peripheral edema   HTN (hypertension)   DM (diabetes mellitus)   Renal failure   Anemia   Hypokalemia   James [name] K  [facility] Pager [phone]. If 7PM-7AM, please contact night-coverage at [email or web address], password  [facility] 03/24/2015, 1:38 PM  LOS: 2 days

## 2015-03-24 NOTE — Consult Note (Signed)
CARDIOLOGY CONSULT NOTE  Patient ID: James [name], MRN: [id number], DOB/AGE: 57-21-1957 57 y.o. Admit date: 03/22/2015 Date of Consult: 03/24/2015  Primary Physician: No primary care provider on file. Primary Cardiologist: none Referring Physician: Dr Rhona Leavens  Chief Complaint: Swelling/shortness of breath Reason for Consultation: CHF  HPI: 59 year old gentleman admitted 4/16 with decompensated heart failure. The patient has no history of cardiac disease. He reports 4-6 weeks of worsening leg and scrotal swelling. Over the last 2 weeks he has developed orthopnea and exertional dyspnea. He denies chest pain, chest pressure, or PND. He was seen in the [facility] health clinic and given oral diuretics. He did not improve over 2 weeks and decided to come to the emergency department for evaluation.  On admission, the patient's creatinine was greater than 4 mg/dL. He has not been told of any kidney problems in the past. He has been given IV diuretics and reports improvement in his shortness of breath and swelling over the last 48 hours.  The patient works in a factory. He frequently left boxes that weigh about 70 pounds. He does not smoke cigarettes or drink alcohol.  Medical History:  Past Medical History  Diagnosis Date  . Hypertension   . Diabetes mellitus without complication       Surgical History:  Past Surgical History  Procedure Laterality Date  . Colonoscopy       Home Meds: Prior to Admission medications   Medication Sig Start Date End Date Taking? Authorizing Provider  furosemide (LASIX) 20 MG tablet Take 20 mg by mouth daily.   Yes Historical Provider, MD  gabapentin (NEURONTIN) 800 MG tablet Take 400 mg by mouth 2 (two) times daily.   Yes Historical Provider, MD  insulin detemir (LEVEMIR) 100 UNIT/ML injection Inject 20 Units into the skin 2 (two) times daily.   Yes Historical Provider, MD  lisinopril (PRINIVIL,ZESTRIL) 20 MG tablet Take 20 mg by mouth daily.   Yes  Historical Provider, MD  metoprolol tartrate (LOPRESSOR) 25 MG tablet Take 25 mg by mouth 2 (two) times daily.   Yes Historical Provider, MD    Inpatient Medications:  . furosemide  80 mg Intravenous BID  . gabapentin  400 mg Oral BID  . heparin  5,000 Units Subcutaneous 3 times per day  . insulin aspart  0-9 Units Subcutaneous TID WC  . insulin detemir  10 Units Subcutaneous QHS  . metoprolol tartrate  50 mg Oral BID  . sodium chloride  3 mL Intravenous Q12H      Allergies: No Known Allergies  History   Social History  . Marital Status: Single    Spouse Name: N/A  . Number of Children: N/A  . Years of Education: N/A   Occupational History  . Not on file.   Social History Main Topics  . Smoking status: Never Smoker   . Smokeless tobacco: Not on file  . Alcohol Use: No  . Drug Use: No  . Sexual Activity: Not on file   Other Topics Concern  . Not on file   Social History Narrative  . No narrative on file    Family history: Negative for premature CAD and congestive heart failure  Review of Systems: General: negative for chills, fever, night sweats or weight changes.  ENT: negative for rhinorrhea or epistaxis Cardiovascular: See history of present illness Dermatological: negative for rash Respiratory: negative for cough or wheezing GI: negative for nausea, vomiting, diarrhea, bright red blood per rectum, melena, or hematemesis GU: no  hematuria, urgency, or frequency Neurologic: negative for visual changes, syncope, headache, or dizziness Heme: no easy bruising or bleeding Endo: negative for excessive thirst, thyroid disorder, or flushing. Positive for diabetes Musculoskeletal: negative for joint pain or swelling, negative for myalgias  All other systems reviewed and are otherwise negative except as noted above.  Physical Exam: Blood pressure 165/97, pulse 89, temperature 99.3 F (37.4 C), temperature source Oral, resp. rate 16, height 5\' 5"  (1.651 m), weight  244 lb 6.4 oz (110.859 kg), SpO2 97 %. Pt is alert and oriented, WD, WN, pleasant obese male in no distress. HEENT: normal Neck: JVP elevated. Carotid upstrokes normal without bruits. No thyromegaly. Lungs: equal expansion, rales in both bases CV: Apex is nonpalpable, RRR without murmur or gallop Abd: soft, NT, +BS, no bruit, no hepatosplenomegaly Back: no CVA tenderness Ext: Diffuse 3+ edema extending up to the thighs bilaterally        DP/PT pulses intact and = GU: Marked Scrotal edema is present Skin: warm and dry without rash Neuro: CNII-XII intact             Strength intact = bilaterally    Labs: No results for input(s): CKTOTAL, CKMB, TROPONINI in the last 72 hours. Lab Results  Component Value Date   WBC 5.6 03/22/2015   HGB 8.1* 03/22/2015   HCT 24.7* 03/22/2015   MCV 86.4 03/22/2015   PLT 310 03/22/2015    Recent Labs Lab 03/23/15 0330 03/24/15 0411  NA 141 142  K 3.3* 4.3  CL 103 105  CO2 27 28  BUN 26* 27*  CREATININE 4.48* 4.54*  CALCIUM 7.5* 7.5*  PROT 7.1  --   BILITOT 0.6  --   ALKPHOS 159*  --   ALT 23  --   AST 29  --   GLUCOSE 114* 136*   No results found for: CHOL, HDL, LDLCALC, TRIG No results found for: DDIMER  Radiology/Studies:  Dg Chest 2 View  03/22/2015   CLINICAL DATA:  Hypoxia.  Hypertension and diabetes.  EXAM: CHEST  2 VIEW  COMPARISON:  None.  FINDINGS: There is mild cardiomegaly and vascular pedicle widening. Diffuse interstitial opacity with Kerley lines. Small bilateral pleural effusions with atelectasis.  IMPRESSION: CHF.   Electronically Signed   By: Marnee Spring M.D.   On: 03/22/2015 22:51   US Renal  03/22/2015   CLINICAL DATA:  Renal failure.  Abdominal swelling.  Leg swelling.  EXAM: RENAL/URINARY TRACT ULTRASOUND COMPLETE  COMPARISON:  None.  FINDINGS: Right Kidney:  Length: 13.5 cm. Echogenicity within normal limits. No mass or hydronephrosis visualized.  Left Kidney:  Length: 12.1 cm. Echogenicity within normal limits.  There is a 1.4 cm cyst in the interpolar left kidney. No solid mass or hydronephrosis visualized.  Bladder:  Appears normal for degree of bladder distention.  Incidental:  Bilateral pleural effusions noted.  IMPRESSION: 1. Mild asymmetry in renal size, with right kidney mildly enlarged measuring 13.5 cm. No hydronephrosis or localizing abnormality. This may be related to mild renal hypertrophy. 2. Simple cyst in the interpolar left kidney, left kidney is otherwise normal. 3. Bilateral pleural effusions, incidentally noted.   Electronically Signed   By: Rubye Oaks M.D.   On: 03/22/2015 22:45    EKG: Normal sinus rhythm with possible age-indeterminate anteroseptal MI  Cardiac Studies: 2D Echo: Study Conclusions  - Left ventricle: Decrease thickening of the inferolateral segments and the base inferior segment. EF is 45-50%. The cavity size was normal. Wall thickness was  increased in a pattern of moderate to severe LVH. Findings consistent with left ventricular diastolic dysfunction. - Left atrium: The atrium was mildly dilated. - Right ventricle: The cavity size was normal. Systolic function was normal. - Pericardium, extracardiac: A trivial pericardial effusion was identified posterior to the heart. - Impressions: Large pleural effusion.  Impressions:  - Large pleural effusion.  ASSESSMENT AND PLAN:  1. Acute decompensated systolic and diastolic heart failure, New York Heart Association functional class IV 2. Probable acute kidney injury on chronic kidney disease. Currently with GFR approximately 15 3. Type 2 diabetes with complications as outlined above 4. Hypertension 5. Anemia, suspect chronic disease 6. Left pleural effusion  The patient's clinical exam, echo, radiographic data, and lab findings (BNP greater than 1000) are all suggestive of decompensated heart failure. The patient has anasarca and his heart failure is complicated by advanced renal disease of  uncertain chronicity. I agree that treatment includes IV diuresis, with holding ACE inhibitor's and any other nephrotoxic agents, and sodium restriction. Considering his ongoing hypertension, would add isosorbide and hydralazine to his medical regimen. He appears to be diuresing well on furosemide 80 mg IV twice daily. I suspect he will require several more days in the hospital as he is markedly volume overloaded. Our team will follow along with you. The patient is not a candidate for invasive cardiac evaluation because of his advanced renal disease and risk of further kidney injury. I spent an extensive amount of time discussing lifestyle modification necessary in the ongoing management of his congestive heart failure. He understands the need to restrict fluids and sodium, check daily weights, and adhere to medications.  Signed, Tonny Bollman MD, [facility] 03/24/2015, 11:59 AM

## 2015-03-24 NOTE — Care Management Note (Unsigned)
    Page 1 of 1   04/02/2015     12:03:32 PM CARE MANAGEMENT NOTE 04/02/2015  Patient:  James [name], James [name]   Account Number:  [id number]  Date Initiated:  03/24/2015  Documentation initiated by:  Vince Ainsley  Subjective/Objective Assessment:   Pt adm on 03/22/15 with CHF exacerbation.  PTA, pt resides at home with brother.     Action/Plan:   Will follow for dc needs as pt progresses.  Pt has no insurance, and has PCP at [facility].   Anticipated DC Date:  04/07/2015   Anticipated DC Plan:  [address]  CM consult      Choice offered to / List presented to:             Status of service:  In process, will continue to follow Medicare Important Message given?  NO (If response is "NO", the following Medicare IM given date fields will be blank) Date Medicare IM given:   Medicare IM given by:   Date Additional Medicare IM given:   Additional Medicare IM given by:    Discharge Disposition:    Per UR Regulation:  Reviewed for med. necessity/level of care/duration of stay  If discussed at [place] of Stay Meetings, dates discussed:   03/27/2015  04/01/2015    Comments:  04/02/15 Ellan Lambert, RN, BSN [phone] Pt for renal and bone marrow biopsies today.  Crea 6.04 today; may need HD in near future.  Cont to follow progress.  03/28/15 Ellan Lambert, RN, BSN [phone] Pt may benefit from [facility] at dc for disease management of CHF.

## 2015-03-25 DIAGNOSIS — I5023 Acute on chronic systolic (congestive) heart failure: Secondary | ICD-10-CM

## 2015-03-25 DIAGNOSIS — I509 Heart failure, unspecified: Secondary | ICD-10-CM | POA: Insufficient documentation

## 2015-03-25 LAB — BASIC METABOLIC PANEL
ANION GAP: 7 (ref 5–15)
BUN: 29 mg/dL — AB (ref 6–23)
CHLORIDE: 104 mmol/L (ref 96–112)
CO2: 28 mmol/L (ref 19–32)
Calcium: 7.6 mg/dL — ABNORMAL LOW (ref 8.4–10.5)
Creatinine, Ser: 4.59 mg/dL — ABNORMAL HIGH (ref 0.50–1.35)
GFR calc Af Amer: 15 mL/min — ABNORMAL LOW (ref >90)
GFR calc non Af Amer: 13 mL/min — ABNORMAL LOW (ref >90)
Glucose, Bld: 122 mg/dL — ABNORMAL HIGH (ref 70–99)
Potassium: 4.1 mmol/L (ref 3.5–5.1)
Sodium: 139 mmol/L (ref 135–145)

## 2015-03-25 LAB — GLUCOSE, CAPILLARY
GLUCOSE-CAPILLARY: 106 mg/dL — AB (ref 70–99)
GLUCOSE-CAPILLARY: 186 mg/dL — AB (ref 70–99)
Glucose-Capillary: 103 mg/dL — ABNORMAL HIGH (ref 70–99)
Glucose-Capillary: 149 mg/dL — ABNORMAL HIGH (ref 70–99)

## 2015-03-25 NOTE — Clinical Documentation Improvement (Signed)
Current notes reflect "hypoxic respiratory failure", oxygen 2L via [place], acute on chronic combined systolic and diastolic heart failure.  Please provide the acuity of the respiratory failure (acute, chronic, acute on chronic) and document in your progress note and carry over to the discharge summary.    Thank you, Mateo Flow, RN [phone] Clinical Documentation Specialist

## 2015-03-25 NOTE — Progress Notes (Signed)
Subjective: No CP  Breathihng and swelling are getting better   Objective: Filed Vitals:   03/24/15 1508 03/24/15 1540 03/24/15 2111 03/25/15 0621  BP: 130/77 151/81 153/82 142/70  Pulse: 74 82 86 87  Temp: 98.4 F (36.9 C)  98.4 F (36.9 C) 98.5 F (36.9 C)  TempSrc: Oral  Oral Oral  Resp: 18  16 17   Height:      Weight:    244 lb 1.6 oz (110.723 kg)  SpO2: 99%  96% 90%   Weight change: -4.8 oz (-0.136 kg)  Intake/Output Summary (Last 24 hours) at 03/25/15 0743 Last data filed at 03/25/15 [id number]  Gross per 24 hour  Intake   1410 ml  Output   2150 ml  Net   -740 ml    General: Alert, awake, oriented x3, in no acute distress Neck:  JVP is increased Heart: Regular rate and rhythm, without murmurs, rubs, gallops.  Lungs: Clear to auscultation.  No rales or wheezes. Exemities:  1-2+ edema.   Neuro: Grossly intact, nonfocal.  Neg neg 3.87 L    Lab Results: Results for orders placed or performed during the hospital encounter of 03/22/15 (from the past 24 hour(s))  Magnesium     Status: None   Collection Time: 03/24/15  8:00 AM  Result Value Ref Range   Magnesium 1.9 1.5 - 2.5 mg/dL  Glucose, capillary     Status: Abnormal   Collection Time: 03/24/15 11:28 AM  Result Value Ref Range   Glucose-Capillary 182 (H) 70 - 99 mg/dL   Comment 1 Notify RN   Glucose, capillary     Status: Abnormal   Collection Time: 03/24/15  4:50 PM  Result Value Ref Range   Glucose-Capillary 134 (H) 70 - 99 mg/dL   Comment 1 Notify RN   Glucose, capillary     Status: Abnormal   Collection Time: 03/24/15  9:09 PM  Result Value Ref Range   Glucose-Capillary 213 (H) 70 - 99 mg/dL   Comment 1 Notify RN    Comment 2 Document in Chart   Basic metabolic panel     Status: Abnormal   Collection Time: 03/25/15  4:57 AM  Result Value Ref Range   Sodium 139 135 - 145 mmol/L   Potassium 4.1 3.5 - 5.1 mmol/L   Chloride 104 96 - 112 mmol/L   CO2 28 19 - 32 mmol/L   Glucose, Bld 122 (H) 70 - 99  mg/dL   BUN 29 (H) 6 - 23 mg/dL   Creatinine, Ser 4.59 (H) 0.50 - 1.35 mg/dL   Calcium 7.6 (L) 8.4 - 10.5 mg/dL   GFR calc non Af Amer 13 (L) >90 mL/min   GFR calc Af Amer 15 (L) >90 mL/min   Anion gap 7 5 - 15  Glucose, capillary     Status: Abnormal   Collection Time: 03/25/15  5:47 AM  Result Value Ref Range   Glucose-Capillary 103 (H) 70 - 99 mg/dL    Studies/Results: No results found.  Medications:REviewed    @PROBHOSP @  1  Acute on chronic systolic/diastolic CHF  Still with volume increase on exam  Patinet admits to eating salty food and drinking more than he should  Will behave differently now   COntinue diuresis    2.  CKD  Follow   He is rsponding to lasix.    3.  HTN  Imiproved      LOS: 3 days   Dorris Carnes 03/25/2015, 7:43 AM

## 2015-03-25 NOTE — Progress Notes (Signed)
Heart Failure Navigator Consult Note  Presentation: James [name] is a 59 y.o. male with a history of diabetes, hypertension, that usually gets his care at a clinic in [facility], presents to the ED with chief complaint of LE swelling as well as scrotal swelling. He denies history of heart failure. He endorses LE swelling ongoing and progressive for the past 4 weeks. He saw his PCP about 2 weeks ago and he was started on Lasix, however despite taking it has not noticed any improvement, in fact, he thinks his swelling got worse and decided to come to ED. He denies chest pain, denies abdominal pain, nausea/vomiting or diarrhea. He denies fevers or chills. He endorses dyspnea with ambulation which has progressively worsened. Endorses difficulties with laying flat due to dyspnea. He denies cough/chest congestion. He endorses weight gain however is unable to quantify how much. He reports good urine output. In the ED, patient was found to be in renal failure with Cr of 4, hypoxic on room air requiring 2L [place], has an elevated BNP to ~1000 and anemia.    Past Medical History  Diagnosis Date  . Hypertension   . Diabetes mellitus without complication     History   Social History  . Marital Status: Single    Spouse Name: N/A  . Number of Children: N/A  . Years of Education: N/A   Social History Main Topics  . Smoking status: Never Smoker   . Smokeless tobacco: Not on file  . Alcohol Use: No  . Drug Use: No  . Sexual Activity: Not on file   Other Topics Concern  . None   Social History Narrative  . None    ECHO:Study Conclusions-03/23/15  - Left ventricle: Decrease thickening of the inferolateral segments and the base inferior segment. EF is 45-50%. The cavity size was normal. Wall thickness was increased in a pattern of moderate to severe LVH. Findings consistent with left ventricular diastolic dysfunction. - Left atrium: The atrium was mildly dilated. - Right ventricle:  The cavity size was normal. Systolic function was normal. - Pericardium, extracardiac: A trivial pericardial effusion was identified posterior to the heart. - Impressions: Large pleural effusion.  Impressions:  - Large pleural effusion.  Transthoracic echocardiography. M-mode, complete 2D, spectral Doppler, and color Doppler. Birthdate: Patient birthdate: 11-14-1956. Age: Patient is 59 yr old. Sex: Gender: male. BMI: 40.9 kg/m^2. Blood pressure:   181/98 Patient status: Inpatient. Study date: Study date: 03/23/2015. Study time: 05:38 PM. Location: Bedside.  BNP    Component Value Date/Time   BNP 1043.8* 03/22/2015 1903    ProBNP No results found for: PROBNP   Education Assessment and Provision:  Detailed education and instructions provided on heart failure disease management including the following:  Signs and symptoms of Heart Failure When to call the physician Importance of daily weights Low sodium diet Fluid restriction Medication management Anticipated future follow-up appointments  Patient education given on each of the above topics.  Patient acknowledges understanding and acceptance of all instructions.  I spoke with Mr. Dewit regarding his HF.  He acknowledges that he has received HF education prior.   He does have a scale and says he "normally" weighs everyday.  I reviewed the importance of daily weights and how the weights relate to signs symptoms of HF.  He can teach back importance of a low sodium diet and I reinforced high sodium foods to avoid.  He says that he can get and take prescribed medications with no issue.  He  lives with his mother and says his brother is there "sometimes".  Education Materials:  "Living Better With Heart Failure" Booklet, Daily Weight Tracker Tool    High Risk Criteria for Readmission and/or Poor Patient Outcomes:   EF <30%- No 45-50%  2 or more admissions in 6 months- No  Difficult social situation-  No  Demonstrates medication noncompliance- No   Barriers of Care:  Knowledge and compliance   Discharge Planning:   Plans to return home with mother and brother.  He would benefit from [facility] for ongoing education, compliance reinforcement and symptom recognition.

## 2015-03-25 NOTE — Progress Notes (Addendum)
[facility] PROGRESS NOTE  James [name] OZH:[id number] DOB: 17-Feb-1956 DOA: 03/22/2015 PCP: No primary care provider on file.  Off Service Summary:. 59yo who presented with scrotal swelling and generalized edema. The patient was found to have ARF and evidence of heart failure. The patient has since been continued on IV lasix with good diuresis thus far. Cardiology is following.  Assessment/Plan: 1. Acutely decompensated systolic/diastolic CHF exacerbation 1. Wt currently 110.7kg<<<116.3kg 2. Thus far net neg 3.37L since admit 3. Pt remains clinically volume overloaded with marked anasarca, albeit improving 4. Pt with improved orthopnea and decreased LE swelling 5. 2d echo with EF 45-50% with LV diastolic dysfuntion 6. TSH normal 7. Tolerating lasix at 80mg  IV bid 8. Cardiology following 9. Will continue 2. Hypoxic respiratory failure 1. Secondary to above 2. Presently on min O2 support 3. ARF 1. Unclear etiology 2. Diuresing patient as he is clinically volume overloaded 3. Tolerating IV lasix thus far 4. Continue to monitor renal function closely 4. DM2 1. Initially noted to be hypoglycemic in the setting of renal failure 2. Glucose ranges from 100-200's 3. On levemir at 12 units 4. Cont SSI coverage 5. HTN 1. Increased diuretic dose 2. Increased metoprolol to 50mg  bid 3. Isosorbide and hydralazine added per Cardiology 4. BP improved 6. Anemia 1. Iron deficient 2. Will follow cbc 7. Hypokalemia 1. Replaced 8. Hypomagnesemia 1. Replaced 9. DVT prophylaxis 1. Heparin subQ  Code Status: Full Family Communication: Pt in room Disposition Plan: Pending   Consultants:  Cardiology  Procedures:    Antibiotics:  none  HPI/Subjective: Pt reports less swelling today. States feeling lighter on his feet   Objective: Filed Vitals:   03/24/15 1540 03/24/15 2111 03/25/15 0621 03/25/15 1028  BP: 151/81 153/82 142/70 168/102  Pulse: 82 86 87 87  Temp:  98.4  F (36.9 C) 98.5 F (36.9 C)   TempSrc:  Oral Oral   Resp:  16 17   Height:      Weight:   110.723 kg (244 lb 1.6 oz)   SpO2:  96% 90%     Intake/Output Summary (Last 24 hours) at 03/25/15 1200 Last data filed at 03/25/15 1033  Gross per 24 hour  Intake   1250 ml  Output   2150 ml  Net   -900 ml   Filed Weights   03/23/15 0337 03/24/15 0611 03/25/15 0621  Weight: 111.766 kg (246 lb 6.4 oz) 110.859 kg (244 lb 6.4 oz) 110.723 kg (244 lb 1.6 oz)    Exam:   General:  Awake, sitting in bed, in nad  Cardiovascular: regular, s1, s2  Respiratory: normal resp effort, no wheezing, decreased BS  Abdomen: soft, obese, nondistended, pos BS  Musculoskeletal: 2 + pitting LE edema B with scrotal edema that appears improved  Data Reviewed: Basic Metabolic Panel:  Recent Labs Lab 03/22/15 1903 03/23/15 0330 03/23/15 0727 03/24/15 0411 03/24/15 0800 03/25/15 0457  NA 137 141  --  142  --  139  K 3.0* 3.3*  --  4.3  --  4.1  CL 103 103  --  105  --  104  CO2 26 27  --  28  --  28  GLUCOSE 157* 114*  --  136*  --  122*  BUN 27* 26*  --  27*  --  29*  CREATININE 4.38* 4.48*  --  4.54*  --  4.59*  CALCIUM 7.2* 7.5*  --  7.5*  --  7.6*  MG  --   --  1.6  --  1.9  --    Liver Function Tests:  Recent Labs Lab 03/23/15 0330  AST 29  ALT 23  ALKPHOS 159*  BILITOT 0.6  PROT 7.1  ALBUMIN 1.7*   No results for input(s): LIPASE, AMYLASE in the last 168 hours. No results for input(s): AMMONIA in the last 168 hours. CBC:  Recent Labs Lab 03/22/15 1903  WBC 5.6  HGB 8.1*  HCT 24.7*  MCV 86.4  PLT 310   Cardiac Enzymes: No results for input(s): CKTOTAL, CKMB, CKMBINDEX, TROPONINI in the last 168 hours. BNP (last 3 results)  Recent Labs  03/22/15 1903  BNP 1043.8*    ProBNP (last 3 results) No results for input(s): PROBNP in the last 8760 hours.  CBG:  Recent Labs Lab 03/24/15 0608 03/24/15 1128 03/24/15 1650 03/24/15 2109 03/25/15 0547  GLUCAP 132*  182* 134* 213* 103*    No results found for this or any previous visit (from the past 240 hour(s)).   Studies: No results found.  Scheduled Meds: . furosemide  80 mg Intravenous BID  . gabapentin  400 mg Oral BID  . heparin  5,000 Units Subcutaneous 3 times per day  . hydrALAZINE  25 mg Oral TID  . insulin aspart  0-9 Units Subcutaneous TID WC  . insulin detemir  12 Units Subcutaneous QHS  . isosorbide mononitrate  30 mg Oral Daily  . metoprolol tartrate  50 mg Oral BID  . sodium chloride  3 mL Intravenous Q12H   Continuous Infusions:   Principal Problem:   CHF (congestive heart failure) Active Problems:   Peripheral edema   HTN (hypertension)   DM (diabetes mellitus)   Renal failure   Anemia   Hypokalemia   Suzanne Garbers K  [facility] Pager [phone]. If 7PM-7AM, please contact night-coverage at [email or web address], password [email or web address] 03/25/2015, 12:00 PM  LOS: 3 days

## 2015-03-26 DIAGNOSIS — R0902 Hypoxemia: Secondary | ICD-10-CM

## 2015-03-26 DIAGNOSIS — I5020 Unspecified systolic (congestive) heart failure: Secondary | ICD-10-CM

## 2015-03-26 LAB — BASIC METABOLIC PANEL
ANION GAP: 7 (ref 5–15)
BUN: 30 mg/dL — ABNORMAL HIGH (ref 6–23)
CALCIUM: 7.7 mg/dL — AB (ref 8.4–10.5)
CO2: 30 mmol/L (ref 19–32)
Chloride: 101 mmol/L (ref 96–112)
Creatinine, Ser: 4.66 mg/dL — ABNORMAL HIGH (ref 0.50–1.35)
GFR calc Af Amer: 15 mL/min — ABNORMAL LOW (ref >90)
GFR, EST NON AFRICAN AMERICAN: 13 mL/min — AB (ref >90)
Glucose, Bld: 120 mg/dL — ABNORMAL HIGH (ref 70–99)
Potassium: 4.2 mmol/L (ref 3.5–5.1)
Sodium: 138 mmol/L (ref 135–145)

## 2015-03-26 LAB — GLUCOSE, CAPILLARY
GLUCOSE-CAPILLARY: 150 mg/dL — AB (ref 70–99)
Glucose-Capillary: 88 mg/dL (ref 70–99)
Glucose-Capillary: 117 mg/dL — ABNORMAL HIGH (ref 70–99)
Glucose-Capillary: 205 mg/dL — ABNORMAL HIGH (ref 70–99)

## 2015-03-26 LAB — CK: Total CK: 380 U/L — ABNORMAL HIGH (ref 7–232)

## 2015-03-26 MED ORDER — HYDRALAZINE HCL 50 MG PO TABS
50.0000 mg | ORAL_TABLET | Freq: Three times a day (TID) | ORAL | Status: DC
  Administered 2015-03-26 – 2015-03-28 (×6): 50 mg via ORAL
  Filled 2015-03-26 (×8): qty 1

## 2015-03-26 MED ORDER — METOPROLOL TARTRATE 50 MG PO TABS: 75.0000 mg | ORAL_TABLET | Freq: Two times a day (BID) | ORAL | Status: DC

## 2015-03-26 MED ORDER — METOPROLOL TARTRATE 50 MG PO TABS
50.0000 mg | ORAL_TABLET | Freq: Two times a day (BID) | ORAL | Status: DC
  Administered 2015-03-26 – 2015-03-27 (×2): 50 mg via ORAL
  Filled 2015-03-26 (×3): qty 1

## 2015-03-26 MED ORDER — FERROUS SULFATE 325 (65 FE) MG PO TABS
325.0000 mg | ORAL_TABLET | Freq: Every day | ORAL | Status: DC
  Administered 2015-03-27 – 2015-03-29 (×3): 325 mg via ORAL
  Filled 2015-03-26 (×4): qty 1

## 2015-03-26 NOTE — Consult Note (Signed)
Reason for Consult:ARF Referring Physician: Short  James [name] is an 59 y.o. male.  HPI: Pt is a 59yo M with PMH sig for DM, HTN, CKD stage 2-3 (baseline Scr was 1.26 in January 2014 and 2015) who presented to [facility] on 03/22/15 with a 4 week h/o worsening lower extremity and scrotal edema.  He contacted his PCP (the [employer] [facility]) and started him on lasix 2 weeks ago without significant improvement.  He was found to have acute decompensated systolic and diastolic CHF and was started on IV lasix with signfiicant improvement with diuresis, however his Scr has increased over the last few days.  He reports that he had been taking lisinopril prior to admission and denies any knowledge of CKD, nor any family history of CKD.  We were asked to help further evaluate and manage his AKI vs. Subacute kidney disease.   Trend in Creatinine: CREATININE, SER  Date/Time Value Ref Range Status  03/26/2015 03:24 AM 4.66* 0.50 - 1.35 mg/dL Final  03/25/2015 04:57 AM 4.59* 0.50 - 1.35 mg/dL Final  03/24/2015 04:11 AM 4.54* 0.50 - 1.35 mg/dL Final  03/23/2015 03:30 AM 4.48* 0.50 - 1.35 mg/dL Final  03/22/2015 07:03 PM 4.38* 0.50 - 1.35 mg/dL Final  12/10/2013 1.26    12/11/2012 1.25      PMH:   Past Medical History  Diagnosis Date  . Hypertension   . Diabetes mellitus without complication     PSH:   Past Surgical History  Procedure Laterality Date  . Colonoscopy      Allergies: No Known Allergies  Medications:   Prior to Admission medications   Medication Sig Start Date End Date Taking? Authorizing Provider  furosemide (LASIX) 20 MG tablet Take 20 mg by mouth daily.   Yes Historical Provider, MD  gabapentin (NEURONTIN) 800 MG tablet Take 400 mg by mouth 2 (two) times daily.   Yes Historical Provider, MD  insulin detemir (LEVEMIR) 100 UNIT/ML injection Inject 20 Units into the skin 2 (two) times daily.   Yes Historical Provider, MD  lisinopril (PRINIVIL,ZESTRIL) 20 MG tablet Take  20 mg by mouth daily.   Yes Historical Provider, MD  metoprolol tartrate (LOPRESSOR) 25 MG tablet Take 25 mg by mouth 2 (two) times daily.   Yes Historical Provider, MD    Inpatient medications: . furosemide  80 mg Intravenous BID  . gabapentin  400 mg Oral BID  . heparin  5,000 Units Subcutaneous 3 times per day  . hydrALAZINE  50 mg Oral TID  . insulin aspart  0-9 Units Subcutaneous TID WC  . insulin detemir  12 Units Subcutaneous QHS  . isosorbide mononitrate  30 mg Oral Daily  . metoprolol tartrate  50 mg Oral BID  . sodium chloride  3 mL Intravenous Q12H    Discontinued Meds:   Medications Discontinued During This Encounter  Medication Reason  . gabapentin (NEURONTIN) tablet 400 mg Formulary change  . furosemide (LASIX) injection 40 mg   . insulin detemir (LEVEMIR) injection 15 Units   . metoprolol tartrate (LOPRESSOR) tablet 25 mg   . insulin detemir (LEVEMIR) injection 10 Units   . metoprolol (LOPRESSOR) tablet 50 mg   . hydrALAZINE (APRESOLINE) tablet 25 mg   . metoprolol tartrate (LOPRESSOR) tablet 75 mg     Social History:  reports that he has never smoked. He does not have any smokeless tobacco history on file. He reports that he does not drink alcohol or use illicit drugs.  Family History:  No family history on file.  Pertinent items are noted in HPI. Weight change: -0.816 kg (-1 lb 12.8 oz)  Intake/Output Summary (Last 24 hours) at 03/26/15 1528 Last data filed at 03/26/15 1410  Gross per 24 hour  Intake   1060 ml  Output   1600 ml  Net   -540 ml   BP 151/80 mmHg  Pulse 80  Temp(Src) 98.2 F (36.8 C) (Oral)  Resp 18  Ht 5\' 5"  (1.651 m)  Wt 109.907 kg (242 lb 4.8 oz)  BMI 40.32 kg/m2  SpO2 100% Filed Vitals:   03/25/15 1510 03/25/15 2204 03/26/15 0553 03/26/15 1300  BP: 117/58 150/76 152/85 151/80  Pulse: 78 82 84 80  Temp:  99.7 F (37.6 C) 98.8 F (37.1 C) 98.2 F (36.8 C)  TempSrc:  Oral Oral Oral  Resp:  20 18 18   Height:      Weight:    109.907 kg (242 lb 4.8 oz)   SpO2:  96% 99% 100%     General appearance: alert, cooperative and no distress Head: Normocephalic, without obvious abnormality, atraumatic Neck: no adenopathy, no carotid bruit, supple, symmetrical, trachea midline and thyroid not enlarged, symmetric, no tenderness/mass/nodules Resp: clear to auscultation bilaterally Cardio: regular rate and rhythm and no rub GI: soft, non-tender; bowel sounds normal; no masses,  no organomegaly Extremities: edema 3+ pitting lower extremity and scrotal edema  Labs: Basic Metabolic Panel:  Recent Labs Lab 03/22/15 1903 03/23/15 0330 03/24/15 0411 03/25/15 0457 03/26/15 0324  NA 137 141 142 139 138  K 3.0* 3.3* 4.3 4.1 4.2  CL 103 103 105 104 101  CO2 26 27 28 28 30   GLUCOSE 157* 114* 136* 122* 120*  BUN 27* 26* 27* 29* 30*  CREATININE 4.38* 4.48* 4.54* 4.59* 4.66*  ALBUMIN  --  1.7*  --   --   --   CALCIUM 7.2* 7.5* 7.5* 7.6* 7.7*   Liver Function Tests:  Recent Labs Lab 03/23/15 0330  AST 29  ALT 23  ALKPHOS 159*  BILITOT 0.6  PROT 7.1  ALBUMIN 1.7*   No results for input(s): LIPASE, AMYLASE in the last 168 hours. No results for input(s): AMMONIA in the last 168 hours. CBC:  Recent Labs Lab 03/22/15 1903  WBC 5.6  HGB 8.1*  HCT 24.7*  MCV 86.4  PLT 310   PT/INR: @LABRCNTIP (inr:5) Cardiac Enzymes: )No results for input(s): CKTOTAL, CKMB, CKMBINDEX, TROPONINI in the last 168 hours. CBG:  Recent Labs Lab 03/25/15 1214 03/25/15 1706 03/25/15 2202 03/26/15 0608 03/26/15 1128  GLUCAP 106* 149* 186* 88 117*    Iron Studies:  Recent Labs Lab 03/23/15 0330  IRON 18*  TIBC 166*  FERRITIN 306    Xrays/Other Studies: No results found.   Assessment/Plan: 1. Acute vs subacute kidney injury/CKD- last known Scr was 1.26 on 12/10/13 and Hgb was 12.7 at that time.  Now with increase in Scr in setting of decompensated diastolic and systolic CHF with diuresis.  Will warrant further work up  and possible biopsy depending on 24 hour urine protein and serologies.  No indication for dialysis at this time. 2. Acute decompensated diastolic and systolic congestive heart failure- management per cardiology.  Diuresing well.  Cont with lasix 3. HTN- not at goal.  Would increase beta-blocker due to HR in 80's 4. LVH- as above.  Hold ACE/ARB for now 5. Anemia- possibly due to CKD (although his Scr was only 1.26 15 months ago and hgb 12.7 12/10/13).  Will  check SPEP/UPEP, guaiac stools and serologies and iron stores. 6. Hypoalbuminemia- his albumin was 2.8 on 12/10/14 and now <2.  R/o nephrotic syndrome (although spot urine with only 100mg  protein)   Wonda Goodgame A 03/26/2015, 3:28 PM

## 2015-03-26 NOTE — Progress Notes (Signed)
[facility] PROGRESS NOTE  James [name] VHQ:[id number] DOB: 06/02/1956 DOA: 03/22/2015 PCP: No primary care provider on file.  Brief Summary  59yo who presented with scrotal swelling and generalized edema. The patient was found to have  Elevated creatinine and evidence of heart failure.   CArdiology was consulted.The patient was started on IV lasix.   Her creatinine started to rise on 4/20.  Assessment/Plan   acute on chronic systolic and diastolic congestive heart failure -   Weight currently 109.9 kg down from 116 kg at admission -   -530 mL over last 24 hours -   2-D echo with ejection fraction 45-50% with LV diastolic dysfunction -   TSH within normal limits -   Lasix held this afternoon secondary to rising creatinine -   Appreciate cardiology assistance -   Telemetry: Sinus tachycardia   Acute kidney injury,  Baseline creatinine of 1.2 , currently 4.6 -   I personally called  [facility] and [place]. The patient's son creatinine was 1.2 in January 2015 but his creatinine was 3.5 in February 2016. He has never seen a nephrologist. -   Urinalysis demonstrated some hemoglobin and 3-6 RBCs and some hyaline casts , 100 protein -   Nephrology consultation -    Minimize nephrotoxins and renally dose medications -   He has not received any IV contrast recently -   ACE inhibitor has been discontinued  -   Urine protein to creatinine ratio, 24-hour urine protein, IFE ordered by nephrology  Diabetes mellitus type 2 with renal complications , CBGs moderately controlled -   Continue Levemir 12 units with sliding scale insulin   Hypertension , blood pressure still elevated -   Metoprolol 50 mg by mouth twice a day -   Increase hydralazine (done by cardiology) -   Continue imdur -   ACEI held due to AKI  Iron deficiency anemia and probable anemia of chronic kidney disease -   Recommend iron supplementation and follow up with  gastroenterology -  Folate and b12 wnl  Diet:   2 g sodium Access:   PIV IVF:   off Proph:   heparin  Code Status:  full Family Communication:  Patient alone Disposition Plan:  Pending further evaluation for progressive kidney failure   Consultants:   cardiology    nephrology  Procedures:   echo  Antibiotics:   none   HPI/Subjective:   states that his swelling is improved. He denies shortness of breath or chest pain.    Objective: Filed Vitals:   03/25/15 1510 03/25/15 2204 03/26/15 0553 03/26/15 1300  BP: 117/58 150/76 152/85 151/80  Pulse: 78 82 84 80  Temp:  99.7 F (37.6 C) 98.8 F (37.1 C) 98.2 F (36.8 C)  TempSrc:  Oral Oral Oral  Resp:  20 18 18   Height:      Weight:   109.907 kg (242 lb 4.8 oz)   SpO2:  96% 99% 100%    Intake/Output Summary (Last 24 hours) at 03/26/15 1717 Last data filed at 03/26/15 1410  Gross per 24 hour  Intake    840 ml  Output   1300 ml  Net   -460 ml   Filed Weights   03/24/15 0611 03/25/15 0621 03/26/15 0553  Weight: 110.859 kg (244 lb 6.4 oz) 110.723 kg (244 lb 1.6 oz) 109.907 kg (242 lb 4.8 oz)    Exam:   General:   Obese male,No acute distress  HEENT:  NCAT, MMM  Cardiovascular:  RRR, nl S1, S2 no mrg, 2+ pulses, warm extremities  Respiratory:  CTAB, no increased WOB  Abdomen:   NABS, soft, NT/ND  MSK:   Normal tone and bulk, no 2+ slow pitting bilateral LEE  Neuro:  Grossly intact  Data Reviewed: Basic Metabolic Panel:  Recent Labs Lab 03/22/15 1903 03/23/15 0330 03/23/15 0727 03/24/15 0411 03/24/15 0800 03/25/15 0457 03/26/15 0324  NA 137 141  --  142  --  139 138  K 3.0* 3.3*  --  4.3  --  4.1 4.2  CL 103 103  --  105  --  104 101  CO2 26 27  --  28  --  28 30  GLUCOSE 157* 114*  --  136*  --  122* 120*  BUN 27* 26*  --  27*  --  29* 30*  CREATININE 4.38* 4.48*  --  4.54*  --  4.59* 4.66*  CALCIUM 7.2* 7.5*  --  7.5*  --  7.6* 7.7*  MG  --   --  1.6  --  1.9  --   --    Liver  Function Tests:  Recent Labs Lab 03/23/15 0330  AST 29  ALT 23  ALKPHOS 159*  BILITOT 0.6  PROT 7.1  ALBUMIN 1.7*   No results for input(s): LIPASE, AMYLASE in the last 168 hours. No results for input(s): AMMONIA in the last 168 hours. CBC:  Recent Labs Lab 03/22/15 1903  WBC 5.6  HGB 8.1*  HCT 24.7*  MCV 86.4  PLT 310   Cardiac Enzymes: No results for input(s): CKTOTAL, CKMB, CKMBINDEX, TROPONINI in the last 168 hours. BNP (last 3 results)  Recent Labs  03/22/15 1903  BNP 1043.8*    ProBNP (last 3 results) No results for input(s): PROBNP in the last 8760 hours.  CBG:  Recent Labs Lab 03/25/15 1706 03/25/15 2202 03/26/15 0608 03/26/15 1128 03/26/15 1641  GLUCAP 149* 186* 88 117* 150*    No results found for this or any previous visit (from the past 240 hour(s)).   Studies: No results found.  Scheduled Meds: . furosemide  80 mg Intravenous BID  . gabapentin  400 mg Oral BID  . heparin  5,000 Units Subcutaneous 3 times per day  . hydrALAZINE  50 mg Oral TID  . insulin aspart  0-9 Units Subcutaneous TID WC  . insulin detemir  12 Units Subcutaneous QHS  . isosorbide mononitrate  30 mg Oral Daily  . metoprolol tartrate  50 mg Oral BID  . sodium chloride  3 mL Intravenous Q12H   Continuous Infusions:   Principal Problem:   CHF (congestive heart failure) Active Problems:   Peripheral edema   HTN (hypertension)   DM (diabetes mellitus)   Renal failure   Anemia   Hypokalemia   Congestive heart disease    Time spent: 30 min    Prerna Harold, [facility]  [facility] Pager [phone]. If 7PM-7AM, please contact night-coverage at [email or web address], password [email or web address] 03/26/2015, 5:17 PM  LOS: 4 days

## 2015-03-26 NOTE — Progress Notes (Signed)
   Subjective: Breating getting better  No CP  Laying in bed   Objective: Filed Vitals:   03/25/15 1028 03/25/15 1510 03/25/15 2204 03/26/15 0553  BP: 168/102 117/58 150/76 152/85  Pulse: 87 78 82 84  Temp:   99.7 F (37.6 C) 98.8 F (37.1 C)  TempSrc:   Oral Oral  Resp:   20 18  Height:      Weight:    242 lb 4.8 oz (109.907 kg)  SpO2:   96% 99%   Weight change: -1 lb 12.8 oz (-0.816 kg)  Intake/Output Summary (Last 24 hours) at 03/26/15 0735 Last data filed at 03/26/15 0600  Gross per 24 hour  Intake   1570 ml  Output   1800 ml  Net   -230 ml   I/O   -4.4 L   General: Alert, awake, oriented x3, in no acute distress Neck:  JVP is normal Heart: Regular rate and rhythm, without murmurs, rubs, gallops.  Lungs: Clear to auscultation.  No rales or wheezes. Exemities:  1-2+ edema.   Neuro: Grossly intact, nonfocal.  Tele:  SR  80s   Lab Results: Results for orders placed or performed during the hospital encounter of 03/22/15 (from the past 24 hour(s))  Glucose, capillary     Status: Abnormal   Collection Time: 03/25/15 12:14 PM  Result Value Ref Range   Glucose-Capillary 106 (H) 70 - 99 mg/dL  Glucose, capillary     Status: Abnormal   Collection Time: 03/25/15  5:06 PM  Result Value Ref Range   Glucose-Capillary 149 (H) 70 - 99 mg/dL   Comment 1 Notify RN   Glucose, capillary     Status: Abnormal   Collection Time: 03/25/15 10:02 PM  Result Value Ref Range   Glucose-Capillary 186 (H) 70 - 99 mg/dL  Basic metabolic panel     Status: Abnormal   Collection Time: 03/26/15  3:24 AM  Result Value Ref Range   Sodium 138 135 - 145 mmol/L   Potassium 4.2 3.5 - 5.1 mmol/L   Chloride 101 96 - 112 mmol/L   CO2 30 19 - 32 mmol/L   Glucose, Bld 120 (H) 70 - 99 mg/dL   BUN 30 (H) 6 - 23 mg/dL   Creatinine, Ser 4.66 (H) 0.50 - 1.35 mg/dL   Calcium 7.7 (L) 8.4 - 10.5 mg/dL   GFR calc non Af Amer 13 (L) >90 mL/min   GFR calc Af Amer 15 (L) >90 mL/min   Anion gap 7 5 - 15    Glucose, capillary     Status: None   Collection Time: 03/26/15  6:08 AM  Result Value Ref Range   Glucose-Capillary 88 70 - 99 mg/dL    Studies/Results: No results found.  Medications: Reivewed    @PROBHOSP @  1  Acute on chronic systolic CHF  Volume is improiving Patinet more comfortable  Hold PM dose lasix  Follow Cr   2 CKD  Not sure who has followed patient Would prob contact    3  HTN  WOuld increase hydralazine to 50 tid    LOS: 4 days   Dorris Carnes 03/26/2015, 7:35 AM

## 2015-03-27 DIAGNOSIS — I5033 Acute on chronic diastolic (congestive) heart failure: Secondary | ICD-10-CM | POA: Insufficient documentation

## 2015-03-27 DIAGNOSIS — I5031 Acute diastolic (congestive) heart failure: Secondary | ICD-10-CM

## 2015-03-27 DIAGNOSIS — N179 Acute kidney failure, unspecified: Secondary | ICD-10-CM

## 2015-03-27 DIAGNOSIS — I5030 Unspecified diastolic (congestive) heart failure: Secondary | ICD-10-CM

## 2015-03-27 LAB — RENAL FUNCTION PANEL
ANION GAP: 8 (ref 5–15)
Albumin: 1.7 g/dL — ABNORMAL LOW (ref 3.5–5.2)
BUN: 33 mg/dL — AB (ref 6–23)
CO2: 30 mmol/L (ref 19–32)
Calcium: 8 mg/dL — ABNORMAL LOW (ref 8.4–10.5)
Chloride: 101 mmol/L (ref 96–112)
Creatinine, Ser: 4.62 mg/dL — ABNORMAL HIGH (ref 0.50–1.35)
GFR calc Af Amer: 15 mL/min — ABNORMAL LOW (ref >90)
GFR calc non Af Amer: 13 mL/min — ABNORMAL LOW (ref >90)
Glucose, Bld: 110 mg/dL — ABNORMAL HIGH (ref 70–99)
PHOSPHORUS: 5.7 mg/dL — AB (ref 2.3–4.6)
POTASSIUM: 4.4 mmol/L (ref 3.5–5.1)
Sodium: 139 mmol/L (ref 135–145)

## 2015-03-27 LAB — PROTEIN ELECTROPHORESIS, SERUM
A/G Ratio: 0.5 — ABNORMAL LOW (ref 0.7–2.0)
ALPHA-2-GLOBULIN: 1.1 g/dL (ref 0.4–1.2)
Albumin ELP: 2.1 g/dL — ABNORMAL LOW (ref 3.2–5.6)
Alpha-1-Globulin: 0.3 g/dL (ref 0.1–0.4)
Beta Globulin: 0.8 g/dL (ref 0.6–1.3)
Gamma Globulin: 2 g/dL — ABNORMAL HIGH (ref 0.5–1.6)
Globulin, Total: 4.3 g/dL (ref 2.0–4.5)
M-SPIKE, %: 1.8 g/dL — AB
Total Protein ELP: 6.4 g/dL (ref 6.0–8.5)

## 2015-03-27 LAB — COMPLEMENT, TOTAL: Compl, Total (CH50): 56 U/mL (ref 42–60)

## 2015-03-27 LAB — C4 COMPLEMENT: COMPLEMENT C4, BODY FLUID: 25 mg/dL (ref 14–44)

## 2015-03-27 LAB — GLUCOSE, CAPILLARY
GLUCOSE-CAPILLARY: 104 mg/dL — AB (ref 70–99)
GLUCOSE-CAPILLARY: 173 mg/dL — AB (ref 70–99)
GLUCOSE-CAPILLARY: 183 mg/dL — AB (ref 70–99)
Glucose-Capillary: 136 mg/dL — ABNORMAL HIGH (ref 70–99)

## 2015-03-27 LAB — GLOMERULAR BASEMENT MEMBRANE ANTIBODIES: GBM Ab: 3 units (ref 0–20)

## 2015-03-27 LAB — C3 COMPLEMENT: C3 Complement: 128 mg/dL (ref 82–167)

## 2015-03-27 LAB — ANTISTREPTOLYSIN O TITER: ASO: 13.7 IU/mL (ref 0.0–200.0)

## 2015-03-27 LAB — MPO/PR-3 (ANCA) ANTIBODIES
ANCA Proteinase 3: <3.5 U/mL (ref 0.0–3.5)
Myeloperoxidase Abs: <9 U/mL (ref 0.0–9.0)

## 2015-03-27 LAB — PARATHYROID HORMONE, INTACT (NO CA): PTH: 118 pg/mL — ABNORMAL HIGH (ref 15–65)

## 2015-03-27 MED ORDER — METOPROLOL TARTRATE 50 MG PO TABS
75.0000 mg | ORAL_TABLET | Freq: Two times a day (BID) | ORAL | Status: DC
  Administered 2015-03-27 – 2015-03-30 (×6): 75 mg via ORAL
  Filled 2015-03-27 (×7): qty 1

## 2015-03-27 MED ORDER — CALCITRIOL 0.25 MCG PO CAPS
0.2500 ug | ORAL_CAPSULE | ORAL | Status: DC
  Administered 2015-03-28 – 2015-04-04 (×4): 0.25 ug via ORAL
  Filled 2015-03-27 (×5): qty 1

## 2015-03-27 NOTE — Progress Notes (Signed)
Patient Name: James [name] Date of Encounter: 03/27/2015     Principal Problem:   CHF (congestive heart failure) Active Problems:   Peripheral edema   HTN (hypertension)   DM (diabetes mellitus)   Acute renal failure   Anemia   Hypokalemia   Congestive heart disease   Hypoxia    SUBJECTIVE  Sleeping comfortably. No CP. Breathing is much improved. No PND. No complaints. Sleepy  CURRENT MEDS . [START ON 03/28/2015] calcitRIOL  0.25 mcg Oral Q M,W,F  . ferrous sulfate  325 mg Oral Q breakfast  . furosemide  80 mg Intravenous BID  . gabapentin  400 mg Oral BID  . heparin  5,000 Units Subcutaneous 3 times per day  . hydrALAZINE  50 mg Oral TID  . insulin aspart  0-9 Units Subcutaneous TID WC  . insulin detemir  12 Units Subcutaneous QHS  . isosorbide mononitrate  30 mg Oral Daily  . metoprolol tartrate  50 mg Oral BID  . sodium chloride  3 mL Intravenous Q12H    OBJECTIVE  Filed Vitals:   03/26/15 0553 03/26/15 1300 03/26/15 2025 03/27/15 1100  BP: 152/85 151/80 150/73 152/100  Pulse: 84 80 85 94  Temp: 98.8 F (37.1 C) 98.2 F (36.8 C) 98.4 F (36.9 C)   TempSrc: Oral Oral Oral   Resp: 18 18 18    Height:      Weight: 242 lb 4.8 oz (109.907 kg)     SpO2: 99% 100% 99%     Intake/Output Summary (Last 24 hours) at 03/27/15 1224 Last data filed at 03/27/15 0928  Gross per 24 hour  Intake   1318 ml  Output   2850 ml  Net  -1532 ml   Filed Weights   03/24/15 0611 03/25/15 0621 03/26/15 0553  Weight: 244 lb 6.4 oz (110.859 kg) 244 lb 1.6 oz (110.723 kg) 242 lb 4.8 oz (109.907 kg)    PHYSICAL EXAM   General: Obese male,No acute distress  HEENT: NCAT, MMM  Cardiovascular: RRR, nl S1, S2 no mrg, 2+ pulses, warm extremities  Respiratory: CTAB, no increased WOB  Abdomen: NABS, soft, NT/ND  MSK: Normal tone and bulk, no 2+ slow pitting bilateral LEE  Neuro: Grossly intact  Accessory Clinical Findings  CBC No results for input(s): WBC,  NEUTROABS, HGB, HCT, MCV, PLT in the last 72 hours. Basic Metabolic Panel  Recent Labs  03/26/15 0324 03/27/15 0538  NA 138 139  K 4.2 4.4  CL 101 101  CO2 30 30  GLUCOSE 120* 110*  BUN 30* 33*  CREATININE 4.66* 4.62*  CALCIUM 7.7* 8.0*  PHOS  --  5.7*   Liver Function Tests  Recent Labs  03/27/15 0538  ALBUMIN 1.7*   No results for input(s): LIPASE, AMYLASE in the last 72 hours. Cardiac Enzymes  Recent Labs  03/26/15 1614  CKTOTAL 380*    TELE  NSR with some sinus tach  Radiology/Studies  Dg Chest 2 View  03/22/2015   CLINICAL DATA:  Hypoxia.  Hypertension and diabetes.  EXAM: CHEST  2 VIEW  COMPARISON:  None.  FINDINGS: There is mild cardiomegaly and vascular pedicle widening. Diffuse interstitial opacity with Kerley lines. Small bilateral pleural effusions with atelectasis.  IMPRESSION: CHF.   Electronically Signed   By: Monte Fantasia M.D.   On: 03/22/2015 22:51   US Renal  03/22/2015   CLINICAL DATA:  Renal failure.  Abdominal swelling.  Leg swelling.  EXAM: RENAL/URINARY TRACT ULTRASOUND COMPLETE  COMPARISON:  None.  FINDINGS: Right Kidney:  Length: 13.5 cm. Echogenicity within normal limits. No mass or hydronephrosis visualized.  Left Kidney:  Length: 12.1 cm. Echogenicity within normal limits. There is a 1.4 cm cyst in the interpolar left kidney. No solid mass or hydronephrosis visualized.  Bladder:  Appears normal for degree of bladder distention.  Incidental:  Bilateral pleural effusions noted.  IMPRESSION: 1. Mild asymmetry in renal size, with right kidney mildly enlarged measuring 13.5 cm. No hydronephrosis or localizing abnormality. This may be related to mild renal hypertrophy. 2. Simple cyst in the interpolar left kidney, left kidney is otherwise normal. 3. Bilateral pleural effusions, incidentally noted.   Electronically Signed   By: Jeb Levering M.D.   On: 03/22/2015 22:45    ASSESSMENT AND PLAN  59yo who presented with scrotal swelling and  generalized edema. The patient was found to have Elevated creatinine and evidence of heart failure. CArdiology was consulted.The patient was started on IV lasix. Her creatinine started to rise on 4/20.  Acute on chronic systolic and diastolic congestive heart failure -He has been on IV lasix 80mg  BID. Weight currently 242lbs down from 256 lbs at admission (neg 14 lbs) and Net neg 6.6L.  Lasix held yesterday secondary to rising creatinine. Now resumed. - 2-D echo with ejection fraction [id number] with LV diastolic dysfunction -TSH within normal limits  Acute kidney injury, creatinine currently 4.6 - Last known Scr was 1.26 on 12/10/13.Nephrology on board. Now with increase in Scr in setting of decompensated diastolic and systolic CHF with diuresis. Will warrant further work up and possible biopsy depending on 24 hour urine protein and serologies. No indication for dialysis at this time.  Per nephrology okay to continue IV diuresis.  - ACE inhibitor has been discontinued due to AKI  Hypertension , blood pressure still elevated -Metoprolol 50 mg BID. Nephrology recommended increasing BB. Will change to 75mg  BID -Hydralazine increased to 50mg  TID yesterday. -Continue imdur -ACEI held due to AKI  DM- per IM    Signed, Eileen Stanford PA-C  Pager [phone]  Patient seen and examined  Agree with findings as noted by Kathlene November above  Patinet is diuresing   Increased b blicker  Follow BP  Renal also following   Dorris Carnes

## 2015-03-27 NOTE — Progress Notes (Addendum)
Note was placed by mistake. Note is in regards to another pt.

## 2015-03-27 NOTE — Progress Notes (Signed)
[facility] PROGRESS NOTE  James [name] UUV:[id number] DOB: 05-07-1956 DOA: 03/22/2015 PCP: No primary care provider on file.  Brief Summary  59yo who presented with scrotal swelling and generalized edema. The patient was found to have elevated creatinine and evidence of heart failure.   Cardiology was consulted and started IV lasix.  Records demonstrated he had sharp rise in creatinine over the last few months.  Nephrology was consulted  Assessment/Plan   acute on chronic systolic and diastolic congestive heart failure -   Weight currently 109.9 kg down from 116 kg at admission -   -2.27L over last 24 hours -   2-D echo with ejection fraction 45-50% with LV diastolic dysfunction -   TSH within normal limits -   Appreciate cardiology assistance -   Telemetry: NSR, okay to d/c telemetry -  Lasix per cardiology   Acute kidney injury,  Baseline creatinine of 1.2 , currently 4.6 -   Creatinine was 1.2 in January 2015 but his creatinine was 3.5 in February 2016. He has never seen a nephrologist. -   Urinalysis demonstrated some hemoglobin and 3-6 RBCs and some hyaline casts , 100 protein -   Appreciate Nephrology assistance -   ACE inhibitor has been discontinued  -   Urine protein to creatinine ratio, 24-hour urine protein, IFE pending  Diabetes mellitus type 2 with renal complications, CBGs moderately controlled -   Continue Levemir 12 units with sliding scale insulin  Hypertension, blood pressure still elevated -   Metoprolol increased to 75mg  twice a day -   Continue hydralazine 50mg  TID -   Continue imdur -   ACEI held due to AKI  Iron deficiency anemia and probable anemia of chronic kidney disease -  Started iron supplementation and follow up with gastroenterology -  Folate and b12 wnl  Diet:   2 g sodium Access:   PIV IVF:   off Proph:   heparin  Code Status:  full Family Communication:  Patient alone Disposition Plan:  Pending further evaluation for  progressive kidney failure   Consultants:   cardiology    nephrology  Procedures:   echo  Antibiotics:   none   HPI/Subjective:  Swelling continues to improve however he continues to have significant scrotal edema. He denies shortness of breath or chest pain.    Objective: Filed Vitals:   03/26/15 1300 03/26/15 2025 03/27/15 1100 03/27/15 1348  BP: 151/80 150/73 152/100 131/77  Pulse: 80 85 94 84  Temp: 98.2 F (36.8 C) 98.4 F (36.9 C)  99.9 F (37.7 C)  TempSrc: Oral Oral  Oral  Resp: 18 18  18   Height:      Weight:      SpO2: 100% 99%  95%    Intake/Output Summary (Last 24 hours) at 03/27/15 1640 Last data filed at 03/27/15 1349  Gross per 24 hour  Intake   1198 ml  Output   2875 ml  Net  -1677 ml   Filed Weights   03/24/15 0611 03/25/15 0621 03/26/15 0553  Weight: 110.859 kg (244 lb 6.4 oz) 110.723 kg (244 lb 1.6 oz) 109.907 kg (242 lb 4.8 oz)    Exam:   General:   Obese male,No acute distress  HEENT:  NCAT, MMM  Cardiovascular:  RRR, nl S1, S2 no mrg, 2+ pulses, warm extremities  Respiratory:  CTAB, no increased WOB  Abdomen:   NABS, soft, NT/ND  MSK:   Normal tone and bulk, 2+ slow pitting bilateral LEE  Neuro:  Grossly intact  Data Reviewed: Basic Metabolic Panel:  Recent Labs Lab 03/23/15 0330 03/23/15 0727 03/24/15 0411 03/24/15 0800 03/25/15 0457 03/26/15 0324 03/27/15 0538  NA 141  --  142  --  139 138 139  K 3.3*  --  4.3  --  4.1 4.2 4.4  CL 103  --  105  --  104 101 101  CO2 27  --  28  --  28 30 30   GLUCOSE 114*  --  136*  --  122* 120* 110*  BUN 26*  --  27*  --  29* 30* 33*  CREATININE 4.48*  --  4.54*  --  4.59* 4.66* 4.62*  CALCIUM 7.5*  --  7.5*  --  7.6* 7.7* 8.0*  MG  --  1.6  --  1.9  --   --   --   PHOS  --   --   --   --   --   --  5.7*   Liver Function Tests:  Recent Labs Lab 03/23/15 0330 03/27/15 0538  AST 29  --   ALT 23  --   ALKPHOS 159*  --   BILITOT 0.6  --   PROT 7.1  --   ALBUMIN  1.7* 1.7*   No results for input(s): LIPASE, AMYLASE in the last 168 hours. No results for input(s): AMMONIA in the last 168 hours. CBC:  Recent Labs Lab 03/22/15 1903  WBC 5.6  HGB 8.1*  HCT 24.7*  MCV 86.4  PLT 310   Cardiac Enzymes:  Recent Labs Lab 03/26/15 1614  CKTOTAL 380*   BNP (last 3 results)  Recent Labs  03/22/15 1903  BNP 1043.8*    ProBNP (last 3 results) No results for input(s): PROBNP in the last 8760 hours.  CBG:  Recent Labs Lab 03/26/15 1641 03/26/15 2102 03/27/15 0608 03/27/15 1122 03/27/15 1622  GLUCAP 150* 205* 104* 173* 136*    No results found for this or any previous visit (from the past 240 hour(s)).   Studies: No results found.  Scheduled Meds: . [START ON 03/28/2015] calcitRIOL  0.25 mcg Oral Q M,W,F  . ferrous sulfate  325 mg Oral Q breakfast  . furosemide  80 mg Intravenous BID  . gabapentin  400 mg Oral BID  . heparin  5,000 Units Subcutaneous 3 times per day  . hydrALAZINE  50 mg Oral TID  . insulin aspart  0-9 Units Subcutaneous TID WC  . insulin detemir  12 Units Subcutaneous QHS  . isosorbide mononitrate  30 mg Oral Daily  . metoprolol tartrate  75 mg Oral BID  . sodium chloride  3 mL Intravenous Q12H   Continuous Infusions:   Principal Problem:   CHF (congestive heart failure) Active Problems:   Peripheral edema   HTN (hypertension)   DM (diabetes mellitus)   Acute renal failure   Anemia   Hypokalemia   Congestive heart disease   Hypoxia    Time spent: 30 min    Hadia Minier  [facility] Pager [phone]. If 7PM-7AM, please contact night-coverage at [email or web address], password [facility] 03/27/2015, 4:40 PM  LOS: 5 days

## 2015-03-27 NOTE — Progress Notes (Signed)
Patient ID: James [name], male   DOB: September 05, 1956, 59 y.o.   MRN: [id number] S:feels a little better today O:BP 150/73 mmHg  Pulse 85  Temp(Src) 98.4 F (36.9 C) (Oral)  Resp 18  Ht 5\' 5"  (1.651 m)  Wt 109.907 kg (242 lb 4.8 oz)  BMI 40.32 kg/m2  SpO2 99%  Intake/Output Summary (Last 24 hours) at 03/27/15 0950 Last data filed at 03/27/15 3664  Gross per 24 hour  Intake   1318 ml  Output   3850 ml  Net  -2532 ml   Intake/Output: I/O last 3 completed shifts: In: 1320 [P.O.:1320] Out: 4150 [Urine:4150]  Intake/Output this shift:  Total I/O In: 598 [P.O.:598] Out: 500 [Urine:500] Weight change:  Gen:WD WN AAM in NAD CVS:no rub Resp:cta QIH:[id number] Ext:3+edema bilateral lower ext   Recent Labs Lab 03/22/15 1903 03/23/15 0330 03/24/15 0411 03/25/15 0457 03/26/15 0324 03/27/15 0538  NA 137 141 142 139 138 139  K 3.0* 3.3* 4.3 4.1 4.2 4.4  CL 103 103 105 104 101 101  CO2 26 27 28 28 30 30   GLUCOSE 157* 114* 136* 122* 120* 110*  BUN 27* 26* 27* 29* 30* 33*  CREATININE 4.38* 4.48* 4.54* 4.59* 4.66* 4.62*  ALBUMIN  --  1.7*  --   --   --  1.7*  CALCIUM 7.2* 7.5* 7.5* 7.6* 7.7* 8.0*  PHOS  --   --   --   --   --  5.7*  AST  --  29  --   --   --   --   ALT  --  23  --   --   --   --    Liver Function Tests:  Recent Labs Lab 03/23/15 0330 03/27/15 0538  AST 29  --   ALT 23  --   ALKPHOS 159*  --   BILITOT 0.6  --   PROT 7.1  --   ALBUMIN 1.7* 1.7*   No results for input(s): LIPASE, AMYLASE in the last 168 hours. No results for input(s): AMMONIA in the last 168 hours. CBC:  Recent Labs Lab 03/22/15 1903  WBC 5.6  HGB 8.1*  HCT 24.7*  MCV 86.4  PLT 310   Cardiac Enzymes:  Recent Labs Lab 03/26/15 1614  CKTOTAL 380*   CBG:  Recent Labs Lab 03/26/15 0608 03/26/15 1128 03/26/15 1641 03/26/15 2102 03/27/15 0608  GLUCAP 88 117* 150* 205* 104*    Iron Studies: No results for input(s): IRON, TIBC, TRANSFERRIN, FERRITIN in the last 72  hours. Studies/Results: No results found. . ferrous sulfate  325 mg Oral Q breakfast  . furosemide  80 mg Intravenous BID  . gabapentin  400 mg Oral BID  . heparin  5,000 Units Subcutaneous 3 times per day  . hydrALAZINE  50 mg Oral TID  . insulin aspart  0-9 Units Subcutaneous TID WC  . insulin detemir  12 Units Subcutaneous QHS  . isosorbide mononitrate  30 mg Oral Daily  . metoprolol tartrate  50 mg Oral BID  . sodium chloride  3 mL Intravenous Q12H    BMET    Component Value Date/Time   NA 139 03/27/2015 0538   K 4.4 03/27/2015 0538   CL 101 03/27/2015 0538   CO2 30 03/27/2015 0538   GLUCOSE 110* 03/27/2015 0538   BUN 33* 03/27/2015 0538   CREATININE 4.62* 03/27/2015 0538   CALCIUM 8.0* 03/27/2015 0538   GFRNONAA 13* 03/27/2015 0538   GFRAA 15* 03/27/2015 5638  CBC    Component Value Date/Time   WBC 5.6 03/22/2015 1903   RBC 3.18* 03/23/2015 0330   RBC 2.86* 03/22/2015 1903   HGB 8.1* 03/22/2015 1903   HCT 24.7* 03/22/2015 1903   PLT 310 03/22/2015 1903   MCV 86.4 03/22/2015 1903   MCH 28.3 03/22/2015 1903   MCHC 32.8 03/22/2015 1903   RDW 14.1 03/22/2015 1903     Assessment/Plan: 1. Acute vs subacute kidney injury/CKD- last known Scr was 1.26 on 12/10/13 and Hgb was 12.7 at that time. Now with increase in Scr in setting of decompensated diastolic and systolic CHF with diuresis. Will warrant further work up and possible biopsy depending on 24 hour urine protein and serologies. No indication for dialysis at this time. 1. If has nephrotic range proteinuria and/or + serologies, he may benefit from renal biopsy 2. Continue to follow renal function for now and await 24 hour urine and serologies. 2. Acute decompensated diastolic and systolic congestive heart failure- management per cardiology. Diuresing well. Cont with lasix 3. HTN- not at goal. Would increase beta-blocker due to HR in 80's 4. LVH- as above. Hold ACE/ARB for now 5. Anemia- possibly due to CKD  (although his Scr was only 1.26 15 months ago and hgb 12.7 12/10/13). Will check SPEP/UPEP, guaiac stools and serologies and iron stores. 6. Secondary HPTH- presumably due to CKD.  Will start calcitriol (iPTH 118).   7. Hypoalbuminemia- his albumin was 2.8 on 12/10/14 and now <2. R/o nephrotic syndrome (although spot urine with only 100mg  protein)  James [name] A

## 2015-03-28 ENCOUNTER — Inpatient Hospital Stay (HOSPITAL_COMMUNITY): Payer: Medicaid Other

## 2015-03-28 DIAGNOSIS — I5033 Acute on chronic diastolic (congestive) heart failure: Secondary | ICD-10-CM

## 2015-03-28 DIAGNOSIS — C9000 Multiple myeloma not having achieved remission: Secondary | ICD-10-CM | POA: Insufficient documentation

## 2015-03-28 LAB — RENAL FUNCTION PANEL
ANION GAP: 6 (ref 5–15)
Albumin: 1.6 g/dL — ABNORMAL LOW (ref 3.5–5.2)
BUN: 34 mg/dL — ABNORMAL HIGH (ref 6–23)
CO2: 32 mmol/L (ref 19–32)
CREATININE: 4.72 mg/dL — AB (ref 0.50–1.35)
Calcium: 7.9 mg/dL — ABNORMAL LOW (ref 8.4–10.5)
Chloride: 98 mmol/L (ref 96–112)
GFR calc Af Amer: 14 mL/min — ABNORMAL LOW (ref >90)
GFR, EST NON AFRICAN AMERICAN: 13 mL/min — AB (ref >90)
Glucose, Bld: 142 mg/dL — ABNORMAL HIGH (ref 70–99)
Phosphorus: 5.9 mg/dL — ABNORMAL HIGH (ref 2.3–4.6)
Potassium: 4.3 mmol/L (ref 3.5–5.1)
Sodium: 136 mmol/L (ref 135–145)

## 2015-03-28 LAB — PROTEIN, URINE, 24 HOUR
Collection Interval-UPROT: 24 hours
Protein, 24H Urine: 5712 mg/d — ABNORMAL HIGH (ref 50–100)
Protein, Urine: 336 mg/dL
URINE TOTAL VOLUME-UPROT: 1700 mL

## 2015-03-28 LAB — GLUCOSE, CAPILLARY
GLUCOSE-CAPILLARY: 112 mg/dL — AB (ref 70–99)
Glucose-Capillary: 123 mg/dL — ABNORMAL HIGH (ref 70–99)
Glucose-Capillary: 168 mg/dL — ABNORMAL HIGH (ref 70–99)
Glucose-Capillary: 188 mg/dL — ABNORMAL HIGH (ref 70–99)

## 2015-03-28 LAB — PROTEIN / CREATININE RATIO, URINE
Creatinine, Urine: 44.47 mg/dL
PROTEIN CREATININE RATIO: 7.51 — AB (ref 0.00–0.15)
Total Protein, Urine: 334 mg/dL

## 2015-03-28 MED ORDER — HYDRALAZINE HCL 50 MG PO TABS
100.0000 mg | ORAL_TABLET | Freq: Three times a day (TID) | ORAL | Status: DC
  Administered 2015-03-28 – 2015-04-01 (×14): 100 mg via ORAL
  Administered 2015-04-02: 50 mg via ORAL
  Administered 2015-04-02 – 2015-04-06 (×11): 100 mg via ORAL
  Filled 2015-03-28 (×32): qty 2

## 2015-03-28 NOTE — Progress Notes (Signed)
[facility] PROGRESS NOTE  James [name] VHQ:[id number] DOB: [birth year] DOA: 03/22/2015 PCP: No primary care provider on file.  Brief Summary  59yo who presented with scrotal swelling and generalized edema. The patient was found to have elevated creatinine and evidence of heart failure.   Cardiology was consulted and started IV lasix.  Records demonstrated he had sharp rise in creatinine over the last few months.  Nephrology was consulted.  On his SPEP he had an M spike. UPEP is completing urine collection today. It is possible that his acute kidney injury is secondary to multiple myeloma as opposed to hypertension and diabetes. Nephrology recommended pursuing bone marrow biopsy instead of kidney biopsy showed his confirmatory test such as IFE,/lambda light chain, bone survey, beta-2 microglobulin return positive.  The latter are pending.  Continuing diuresis.  Assessment/Plan  Acute on chronic systolic and diastolic congestive heart failure -   Weight currently 109.9 kg down from 116 kg at admission -   -1L over last 24 hours -   2-D echo with ejection fraction 45-50% with LV diastolic dysfunction -   TSH within normal limits -   Appreciate cardiology assistance -   Telemetry: NSR, okay to d/c telemetry -   Lasix per cardiology   Acute kidney injury,  Baseline creatinine of 1.2, currently 4.6-4.7 with brisk urine output with Lasix -   Creatinine was 1.2 in January 2015 but his creatinine was 3.5 in February 2016. He has never seen a nephrologist. -   Urinalysis demonstrated some hemoglobin and 3-6 RBCs and some hyaline casts , 100 protein -   Appreciate Nephrology assistance -   ACE inhibitor has been discontinued  -   Urine protein to creatinine ratio, 24-hour urine protein, IFE pending  Possible multiple myeloma with elevated M spike on SPEP - Follow-up UPEP -  Beta-2 microglobulin, cryoglobulin, kappa/lambda  -  Bone survey  Diabetes mellitus type 2 with renal complications,  CBGs at goal -   Continue Levemir 12 units with sliding scale insulin  Hypertension, blood pressure still elevated -   Metoprolol increased to 75mg  twice a day -   Increase hydralazine to 100 mg TID -   Continue imdur -   ACEI held due to AKI  Iron deficiency anemia and probable anemia of chronic kidney disease versus multiple myeloma -  Started iron supplementation and follow up with gastroenterology -  Folate and b12 wnl  Secondary hyperparathyroidism -  Started on calcitriol  Diet:   2 g sodium Access:   PIV IVF:   off Proph:   heparin  Code Status:  full Family Communication:  Patient alone Disposition Plan:  Pending further evaluation for progressive kidney failure   Consultants:   cardiology    nephrology  Procedures:   echo  Antibiotics:   none   HPI/Subjective:  Swelling continues to improve however he continues to have significant scrotal edema.  Answered his questions regarding the possibility of multiple myeloma.  Objective: Filed Vitals:   03/27/15 2039 03/28/15 0608 03/28/15 0628 03/28/15 1043  BP: 142/85 143/74  162/87  Pulse: 85 90  81  Temp: 98.4 F (36.9 C) 98.6 F (37 C)    TempSrc: Oral Oral    Resp: 20 20    Height:      Weight:   108.727 kg (239 lb 11.2 oz)   SpO2: 99% 92%      Intake/Output Summary (Last 24 hours) at 03/28/15 1405 Last data filed at 03/28/15 1050  Gross per  24 hour  Intake   1060 ml  Output   1770 ml  Net   -710 ml   Filed Weights   03/25/15 0621 03/26/15 0553 03/28/15 0628  Weight: 110.723 kg (244 lb 1.6 oz) 109.907 kg (242 lb 4.8 oz) 108.727 kg (239 lb 11.2 oz)    Exam:   General:   Obese male,No acute distress  HEENT:  NCAT, MMM  Cardiovascular:  RRR, nl S1, S2 no mrg, 2+ pulses, warm extremities  Respiratory:  CTAB, no increased WOB  Abdomen:   NABS, soft, NT/ND  MSK:   Normal tone and bulk, 2+ slow pitting bilateral LEE  Neuro:  Grossly intact  GU:  Enlarged/swollen scrotum  Data  Reviewed: Basic Metabolic Panel:  Recent Labs Lab 03/23/15 0727 03/24/15 0411 03/24/15 0800 03/25/15 0457 03/26/15 0324 03/27/15 0538 03/28/15 0430  NA  --  142  --  139 138 139 136  K  --  4.3  --  4.1 4.2 4.4 4.3  CL  --  105  --  104 101 101 98  CO2  --  28  --  28 30 30  32  GLUCOSE  --  136*  --  122* 120* 110* 142*  BUN  --  27*  --  29* 30* 33* 34*  CREATININE  --  4.54*  --  4.59* 4.66* 4.62* 4.72*  CALCIUM  --  7.5*  --  7.6* 7.7* 8.0* 7.9*  MG 1.6  --  1.9  --   --   --   --   PHOS  --   --   --   --   --  5.7* 5.9*   Liver Function Tests:  Recent Labs Lab 03/23/15 0330 03/27/15 0538 03/28/15 0430  AST 29  --   --   ALT 23  --   --   ALKPHOS 159*  --   --   BILITOT 0.6  --   --   PROT 7.1  --   --   ALBUMIN 1.7* 1.7* 1.6*   No results for input(s): LIPASE, AMYLASE in the last 168 hours. No results for input(s): AMMONIA in the last 168 hours. CBC:  Recent Labs Lab 03/22/15 1903  WBC 5.6  HGB 8.1*  HCT 24.7*  MCV 86.4  PLT 310   Cardiac Enzymes:  Recent Labs Lab 03/26/15 1614  CKTOTAL 380*   BNP (last 3 results)  Recent Labs  03/22/15 1903  BNP 1043.8*    ProBNP (last 3 results) No results for input(s): PROBNP in the last 8760 hours.  CBG:  Recent Labs Lab 03/27/15 1122 03/27/15 1622 03/27/15 2038 03/28/15 0612 03/28/15 1114  GLUCAP 173* 136* 183* 112* 123*    No results found for this or any previous visit (from the past 240 hour(s)).   Studies: No results found.  Scheduled Meds: . calcitRIOL  0.25 mcg Oral Q M,W,F  . ferrous sulfate  325 mg Oral Q breakfast  . furosemide  80 mg Intravenous BID  . gabapentin  400 mg Oral BID  . heparin  5,000 Units Subcutaneous 3 times per day  . hydrALAZINE  50 mg Oral TID  . insulin aspart  0-9 Units Subcutaneous TID WC  . insulin detemir  12 Units Subcutaneous QHS  . isosorbide mononitrate  30 mg Oral Daily  . metoprolol tartrate  75 mg Oral BID  . sodium chloride  3 mL  Intravenous Q12H   Continuous Infusions:   Principal Problem:  CHF (congestive heart failure) Active Problems:   Peripheral edema   HTN (hypertension)   DM (diabetes mellitus)   Acute renal failure   Anemia   Hypokalemia   Congestive heart disease   Hypoxia   Acute on chronic diastolic CHF (congestive heart failure), NYHA class 4    Time spent: 30 min    Yotam Rhine, [facility]  [facility] Pager [phone]. If 7PM-7AM, please contact night-coverage at [email or web address], password [facility] 03/28/2015, 2:05 PM  LOS: 6 days

## 2015-03-28 NOTE — Progress Notes (Signed)
Patient Name: James [name] Date of Encounter: 03/28/2015     Principal Problem:   CHF (congestive heart failure) Active Problems:   Peripheral edema   HTN (hypertension)   DM (diabetes mellitus)   Acute renal failure   Anemia   Hypokalemia   Congestive heart disease   Hypoxia   Acute on chronic diastolic CHF (congestive heart failure), NYHA class 4    SUBJECTIVE  Breathing much better .Still with LE and scrotal edema.   CURRENT MEDS . calcitRIOL  0.25 mcg Oral Q M,W,F  . ferrous sulfate  325 mg Oral Q breakfast  . furosemide  80 mg Intravenous BID  . gabapentin  400 mg Oral BID  . heparin  5,000 Units Subcutaneous 3 times per day  . hydrALAZINE  50 mg Oral TID  . insulin aspart  0-9 Units Subcutaneous TID WC  . insulin detemir  12 Units Subcutaneous QHS  . isosorbide mononitrate  30 mg Oral Daily  . metoprolol tartrate  75 mg Oral BID  . sodium chloride  3 mL Intravenous Q12H    OBJECTIVE  Filed Vitals:   03/27/15 1348 03/27/15 2039 03/28/15 0608 03/28/15 0628  BP: 131/77 142/85 143/74   Pulse: 84 85 90   Temp: 99.9 F (37.7 C) 98.4 F (36.9 C) 98.6 F (37 C)   TempSrc: Oral Oral Oral   Resp: 18 20 20    Height:      Weight:    239 lb 11.2 oz (108.727 kg)  SpO2: 95% 99% 92%     Intake/Output Summary (Last 24 hours) at 03/28/15 0941 Last data filed at 03/28/15 0855  Gross per 24 hour  Intake   1060 ml  Output   1595 ml  Net   -535 ml   Filed Weights   03/25/15 0621 03/26/15 0553 03/28/15 0628  Weight: 244 lb 1.6 oz (110.723 kg) 242 lb 4.8 oz (109.907 kg) 239 lb 11.2 oz (108.727 kg)    PHYSICAL EXAM   General: Obese male,No acute distress  HEENT: NCAT, MMM  Cardiovascular: RRR, nl S1, S2 no mrg, 2+ pulses, warm extremities  Respiratory: CTAB, no increased WOB. Dec breath sounds at bases  Abdomen: NABS, soft, NT/ND  MSK: Normal tone and bulk, no 2+ slow pitting bilateral LEE  Neuro: Grossly intact  Accessory Clinical  Findings  CBC No results for input(s): WBC, NEUTROABS, HGB, HCT, MCV, PLT in the last 72 hours. Basic Metabolic Panel  Recent Labs  03/27/15 0538 03/28/15 0430  NA 139 136  K 4.4 4.3  CL 101 98  CO2 30 32  GLUCOSE 110* 142*  BUN 33* 34*  CREATININE 4.62* 4.72*  CALCIUM 8.0* 7.9*  PHOS 5.7* 5.9*   Liver Function Tests  Recent Labs  03/27/15 0538 03/28/15 0430  ALBUMIN 1.7* 1.6*   No results for input(s): LIPASE, AMYLASE in the last 72 hours. Cardiac Enzymes  Recent Labs  03/26/15 1614  CKTOTAL 380*    TELE  Not on tele  Radiology/Studies  Dg Chest 2 View  03/22/2015   CLINICAL DATA:  Hypoxia.  Hypertension and diabetes.  EXAM: CHEST  2 VIEW  COMPARISON:  None.  FINDINGS: There is mild cardiomegaly and vascular pedicle widening. Diffuse interstitial opacity with Kerley lines. Small bilateral pleural effusions with atelectasis.  IMPRESSION: CHF.   Electronically Signed   By: Monte Fantasia M.D.   On: 03/22/2015 22:51   US Renal  03/22/2015   CLINICAL DATA:  Renal failure.  Abdominal swelling.  Leg swelling.  EXAM: RENAL/URINARY TRACT ULTRASOUND COMPLETE  COMPARISON:  None.  FINDINGS: Right Kidney:  Length: 13.5 cm. Echogenicity within normal limits. No mass or hydronephrosis visualized.  Left Kidney:  Length: 12.1 cm. Echogenicity within normal limits. There is a 1.4 cm cyst in the interpolar left kidney. No solid mass or hydronephrosis visualized.  Bladder:  Appears normal for degree of bladder distention.  Incidental:  Bilateral pleural effusions noted.  IMPRESSION: 1. Mild asymmetry in renal size, with right kidney mildly enlarged measuring 13.5 cm. No hydronephrosis or localizing abnormality. This may be related to mild renal hypertrophy. 2. Simple cyst in the interpolar left kidney, left kidney is otherwise normal. 3. Bilateral pleural effusions, incidentally noted.   Electronically Signed   By: Jeb Levering M.D.   On: 03/22/2015 22:45    ASSESSMENT AND  PLAN  59yo who presented with scrotal swelling and generalized edema. The patient was found to have Elevated creatinine and evidence of heart failure. CArdiology was consulted.The patient was started on IV lasix. Her creatinine started to rise on 4/20.  Acute on chronic systolic and diastolic congestive heart failure -He has been on IV lasix 80mg  BID. Weight currently 229lbs down from 256 lbs at admission (neg 17 lbs) and Net neg 7L. Continue Lasix 80mg  IV BID as he still has evidence of volume overload. - 2-D echo with ejection fraction [id number] with LV diastolic dysfunction -TSH within normal limits  Acute kidney injury, creatinine currently 4.7 - Last known Scr was 1.26 on 12/10/13.Nephrology on board. Now with increase in Scr in setting of decompensated diastolic and systolic CHF with diuresis. Will warrant further work up and possible biopsy depending on 24 hour urine protein and serologies. No indication for dialysis at this time.  Per nephrology okay to continue IV diuresis. - ACE inhibitor has been discontinued due to AKI  Hypertension , blood pressure improved on increased BB. -Cont Metoprolol 75 mg BID and Hydralazine 50mg  TID. -Continue imdur -ACEI held due to AKI  DM- per IM   M spike positive, Undergoing work up for MM.    SignedEileen Stanford PA-C  Pager [phone] Patinet seen and examined  Agree with findings of K Thompson.   Patient continues to diurese.  REnal following   Improving but still evid of volume overload on exam. WIll continue to follow.  Dorris Carnes

## 2015-03-28 NOTE — Progress Notes (Signed)
Patient ID: James [name], male   DOB: [birth year], 59 y.o.   MRN: [id number] S:no new complaints O:BP 162/87 mmHg  Pulse 81  Temp(Src) 98.6 F (37 C) (Oral)  Resp 20  Ht 5\' 5"  (1.651 m)  Wt 108.727 kg (239 lb 11.2 oz)  BMI 39.89 kg/m2  SpO2 92%  Intake/Output Summary (Last 24 hours) at 03/28/15 1056 Last data filed at 03/28/15 1050  Gross per 24 hour  Intake   1180 ml  Output   2095 ml  Net   -915 ml   Intake/Output: I/O last 3 completed shifts: In: 1300 [P.O.:1300] Out: 3145 [Urine:3145]  Intake/Output this shift:  Total I/O In: 718 [P.O.:718] Out: 500 [Urine:500] Weight change:  Gen:WD WN AAM in NAD CVS:no rub Resp:cta WUJ:[id number] Ext:3+ edema   Recent Labs Lab 03/22/15 1903 03/23/15 0330 03/24/15 0411 03/25/15 0457 03/26/15 0324 03/27/15 0538 03/28/15 0430  NA 137 141 142 139 138 139 136  K 3.0* 3.3* 4.3 4.1 4.2 4.4 4.3  CL 103 103 105 104 101 101 98  CO2 26 27 28 28 30 30  32  GLUCOSE 157* 114* 136* 122* 120* 110* 142*  BUN 27* 26* 27* 29* 30* 33* 34*  CREATININE 4.38* 4.48* 4.54* 4.59* 4.66* 4.62* 4.72*  ALBUMIN  --  1.7*  --   --   --  1.7* 1.6*  CALCIUM 7.2* 7.5* 7.5* 7.6* 7.7* 8.0* 7.9*  PHOS  --   --   --   --   --  5.7* 5.9*  AST  --  29  --   --   --   --   --   ALT  --  23  --   --   --   --   --    Liver Function Tests:  Recent Labs Lab 03/23/15 0330 03/27/15 0538 03/28/15 0430  AST 29  --   --   ALT 23  --   --   ALKPHOS 159*  --   --   BILITOT 0.6  --   --   PROT 7.1  --   --   ALBUMIN 1.7* 1.7* 1.6*   No results for input(s): LIPASE, AMYLASE in the last 168 hours. No results for input(s): AMMONIA in the last 168 hours. CBC:  Recent Labs Lab 03/22/15 1903  WBC 5.6  HGB 8.1*  HCT 24.7*  MCV 86.4  PLT 310   Cardiac Enzymes:  Recent Labs Lab 03/26/15 1614  CKTOTAL 380*   CBG:  Recent Labs Lab 03/27/15 0608 03/27/15 1122 03/27/15 1622 03/27/15 2038 03/28/15 0612  GLUCAP 104* 173* 136* 183* 112*    Iron  Studies: No results for input(s): IRON, TIBC, TRANSFERRIN, FERRITIN in the last 72 hours. Studies/Results: No results found. . calcitRIOL  0.25 mcg Oral Q M,W,F  . ferrous sulfate  325 mg Oral Q breakfast  . furosemide  80 mg Intravenous BID  . gabapentin  400 mg Oral BID  . heparin  5,000 Units Subcutaneous 3 times per day  . hydrALAZINE  50 mg Oral TID  . insulin aspart  0-9 Units Subcutaneous TID WC  . insulin detemir  12 Units Subcutaneous QHS  . isosorbide mononitrate  30 mg Oral Daily  . metoprolol tartrate  75 mg Oral BID  . sodium chloride  3 mL Intravenous Q12H    BMET    Component Value Date/Time   NA 136 03/28/2015 0430   K 4.3 03/28/2015 0430   CL 98 03/28/2015 0430  CO2 32 03/28/2015 0430   GLUCOSE 142* 03/28/2015 0430   BUN 34* 03/28/2015 0430   CREATININE 4.72* 03/28/2015 0430   CALCIUM 7.9* 03/28/2015 0430   GFRNONAA 13* 03/28/2015 0430   GFRAA 14* 03/28/2015 0430   CBC    Component Value Date/Time   WBC 5.6 03/22/2015 1903   RBC 3.18* 03/23/2015 0330   RBC 2.86* 03/22/2015 1903   HGB 8.1* 03/22/2015 1903   HCT 24.7* 03/22/2015 1903   PLT 310 03/22/2015 1903   MCV 86.4 03/22/2015 1903   MCH 28.3 03/22/2015 1903   MCHC 32.8 03/22/2015 1903   RDW 14.1 03/22/2015 1903     Assessment/Plan: 1. Acute vs subacute kidney injury/CKD- last known Scr was 1.26 on 12/10/13 and Hgb was 12.7 at that time. Now with increase in Scr in setting of decompensated diastolic and systolic CHF with diuresis. Will warrant further work up and possible biopsy depending on 24 hour urine protein and serologies. No indication for dialysis at this time. 1. +Mspike on SPEP, unfortunately his 24 hour urine was discarded will order 24 hour UPEP 2. Agree with skeletal survey as he has [name] high likelihood of multiple myeoloma in light of AKI and anemia 3. Will likely require bone marrow biopsy, would consult Heme/Onc 2. Acute decompensated diastolic and systolic congestive heart  failure- management per cardiology. Diuresing well. Cont with lasix 3. +M spike as above 4. HTN- not at goal. Would increase beta-blocker due to HR in 80's 5. LVH- as above. Hold ACE/ARB for now 6. Anemia- possibly due to CKD (although his Scr was only 1.26 15 months ago and hgb 12.7 12/10/13). Will check SPEP/UPEP, guaiac stools and serologies and iron stores. 7. Secondary HPTH- presumably due to CKD. Will start calcitriol (iPTH 118).  8. Hypoalbuminemia- his albumin was 2.8 on 12/10/14 and now <2. R/o nephrotic syndrome (although spot urine with only 100mg  protein) James [name]

## 2015-03-29 LAB — GLUCOSE, CAPILLARY
GLUCOSE-CAPILLARY: 166 mg/dL — AB (ref 70–99)
Glucose-Capillary: 151 mg/dL — ABNORMAL HIGH (ref 70–99)
Glucose-Capillary: 143 mg/dL — ABNORMAL HIGH (ref 70–99)
Glucose-Capillary: 171 mg/dL — ABNORMAL HIGH (ref 70–99)

## 2015-03-29 LAB — RENAL FUNCTION PANEL
ALBUMIN: 1.7 g/dL — AB (ref 3.5–5.2)
Anion gap: 8 (ref 5–15)
BUN: 38 mg/dL — ABNORMAL HIGH (ref 6–23)
CHLORIDE: 97 mmol/L (ref 96–112)
CO2: 32 mmol/L (ref 19–32)
Calcium: 8.1 mg/dL — ABNORMAL LOW (ref 8.4–10.5)
Creatinine, Ser: 5.04 mg/dL — ABNORMAL HIGH (ref 0.50–1.35)
GFR, EST AFRICAN AMERICAN: 13 mL/min — AB (ref >90)
GFR, EST NON AFRICAN AMERICAN: 12 mL/min — AB (ref >90)
Glucose, Bld: 162 mg/dL — ABNORMAL HIGH (ref 70–99)
PHOSPHORUS: 6.5 mg/dL — AB (ref 2.3–4.6)
POTASSIUM: 4.5 mmol/L (ref 3.5–5.1)
Sodium: 137 mmol/L (ref 135–145)

## 2015-03-29 LAB — CBC
HEMATOCRIT: 23.6 % — AB (ref 39.0–52.0)
Hemoglobin: 7.5 g/dL — ABNORMAL LOW (ref 13.0–17.0)
MCH: 28.1 pg (ref 26.0–34.0)
MCHC: 31.8 g/dL (ref 30.0–36.0)
MCV: 88.4 fL (ref 78.0–100.0)
PLATELETS: 278 10*3/uL (ref 150–400)
RBC: 2.67 MIL/uL — AB (ref 4.22–5.81)
RDW: 14.5 % (ref 11.5–15.5)
WBC: 6.1 10*3/uL (ref 4.0–10.5)

## 2015-03-29 MED ORDER — FERROUS SULFATE 325 (65 FE) MG PO TABS
325.0000 mg | ORAL_TABLET | Freq: Every day | ORAL | Status: DC
  Administered 2015-03-30 – 2015-04-06 (×7): 325 mg via ORAL
  Filled 2015-03-29 (×9): qty 1

## 2015-03-29 MED ORDER — HYDROCERIN EX CREA
TOPICAL_CREAM | Freq: Two times a day (BID) | CUTANEOUS | Status: DC
  Administered 2015-03-29 – 2015-04-12 (×28): via TOPICAL
  Filled 2015-03-29: qty 113

## 2015-03-29 MED ORDER — DEXTROSE 5 % IV SOLN
120.0000 mg | Freq: Two times a day (BID) | INTRAVENOUS | Status: DC
  Administered 2015-03-29 – 2015-03-30 (×3): 120 mg via INTRAVENOUS
  Filled 2015-03-29 (×4): qty 12

## 2015-03-29 MED ORDER — INSULIN ASPART 100 UNIT/ML ~~LOC~~ SOLN
0.0000 [IU] | Freq: Three times a day (TID) | SUBCUTANEOUS | Status: DC
  Administered 2015-03-30 (×2): 2 [IU] via SUBCUTANEOUS
  Administered 2015-03-31: 1 [IU] via SUBCUTANEOUS
  Administered 2015-03-31 – 2015-04-02 (×3): 2 [IU] via SUBCUTANEOUS
  Administered 2015-04-03 – 2015-04-05 (×4): 1 [IU] via SUBCUTANEOUS
  Administered 2015-04-05: 2 [IU] via SUBCUTANEOUS
  Administered 2015-04-06: 1 [IU] via SUBCUTANEOUS
  Administered 2015-04-06: 2 [IU] via SUBCUTANEOUS
  Administered 2015-04-06 – 2015-04-11 (×4): 1 [IU] via SUBCUTANEOUS

## 2015-03-29 NOTE — Progress Notes (Signed)
Patient Name: James [name] Date of Encounter: 03/29/2015     Principal Problem:   CHF (congestive heart failure) Active Problems:   Peripheral edema   HTN (hypertension)   DM (diabetes mellitus)   Acute renal failure   Anemia   Hypokalemia   Congestive heart disease   Hypoxia   Acute on chronic diastolic CHF (congestive heart failure), NYHA class 4   Multiple myeloma    SUBJECTIVE  Breathing much better .Still with LE and scrotal edema.   CURRENT MEDS . calcitRIOL  0.25 mcg Oral Q M,W,F  . ferrous sulfate  325 mg Oral Q breakfast  . furosemide  120 mg Intravenous BID  . gabapentin  400 mg Oral BID  . heparin  5,000 Units Subcutaneous 3 times per day  . hydrALAZINE  100 mg Oral TID  . insulin aspart  0-9 Units Subcutaneous TID WC  . insulin detemir  12 Units Subcutaneous QHS  . isosorbide mononitrate  30 mg Oral Daily  . metoprolol tartrate  75 mg Oral BID  . sodium chloride  3 mL Intravenous Q12H    OBJECTIVE  Filed Vitals:   03/28/15 1725 03/28/15 2144 03/29/15 0540 03/29/15 0958  BP: 150/79 153/82 113/84 150/79  Pulse: 77 77 79   Temp:  98.3 F (36.8 C) 98.9 F (37.2 C)   TempSrc:  Oral Oral   Resp:  18 16   Height:      Weight:   238 lb 14.4 oz (108.364 kg)   SpO2:  96% 99%     Intake/Output Summary (Last 24 hours) at 03/29/15 1026 Last data filed at 03/29/15 0836  Gross per 24 hour  Intake   2273 ml  Output   2075 ml  Net    198 ml   Filed Weights   03/26/15 0553 03/28/15 0628 03/29/15 0540  Weight: 242 lb 4.8 oz (109.907 kg) 239 lb 11.2 oz (108.727 kg) 238 lb 14.4 oz (108.364 kg)    PHYSICAL EXAM   General: Obese male,No acute distress  HEENT: NCAT, MMM  Cardiovascular: RRR, nl S1, S2 no mrg, 2+ pulses, warm extremities  Respiratory: CTAB, no increased WOB. Dec breath sounds at bases  Abdomen: NABS, soft, NT/ND  MSK: Normal tone and bulk, no 2+ slow pitting bilateral LEE  Neuro: Grossly intact  Accessory Clinical  Findings  CBC No results for input(s): WBC, NEUTROABS, HGB, HCT, MCV, PLT in the last 72 hours. Basic Metabolic Panel  Recent Labs  03/28/15 0430 03/29/15 0559  NA 136 137  K 4.3 4.5  CL 98 97  CO2 32 32  GLUCOSE 142* 162*  BUN 34* 38*  CREATININE 4.72* 5.04*  CALCIUM 7.9* 8.1*  PHOS 5.9* 6.5*   Liver Function Tests  Recent Labs  03/28/15 0430 03/29/15 0559  ALBUMIN 1.6* 1.7*   No results for input(s): LIPASE, AMYLASE in the last 72 hours. Cardiac Enzymes  Recent Labs  03/26/15 1614  CKTOTAL 380*    TELE  Not on tele  Radiology/Studies  Dg Chest 2 View  03/22/2015   CLINICAL DATA:  Hypoxia.  Hypertension and diabetes.  EXAM: CHEST  2 VIEW  COMPARISON:  None.  FINDINGS: There is mild cardiomegaly and vascular pedicle widening. Diffuse interstitial opacity with Kerley lines. Small bilateral pleural effusions with atelectasis.  IMPRESSION: CHF.   Electronically Signed   By: Monte Fantasia M.D.   On: 03/22/2015 22:51   US Renal  03/22/2015   CLINICAL DATA:  Renal failure.  Abdominal swelling.  Leg swelling.  EXAM: RENAL/URINARY TRACT ULTRASOUND COMPLETE  COMPARISON:  None.  FINDINGS: Right Kidney:  Length: 13.5 cm. Echogenicity within normal limits. No mass or hydronephrosis visualized.  Left Kidney:  Length: 12.1 cm. Echogenicity within normal limits. There is a 1.4 cm cyst in the interpolar left kidney. No solid mass or hydronephrosis visualized.  Bladder:  Appears normal for degree of bladder distention.  Incidental:  Bilateral pleural effusions noted.  IMPRESSION: 1. Mild asymmetry in renal size, with right kidney mildly enlarged measuring 13.5 cm. No hydronephrosis or localizing abnormality. This may be related to mild renal hypertrophy. 2. Simple cyst in the interpolar left kidney, left kidney is otherwise normal. 3. Bilateral pleural effusions, incidentally noted.   Electronically Signed   By: Jeb Levering M.D.   On: 03/22/2015 22:45   Dg Bone Survey  Met  03/28/2015   CLINICAL DATA:  Metastatic bone survey, multiple myeloma  EXAM: METASTATIC BONE SURVEY  COMPARISON:  03/22/2015  FINDINGS: Twenty-three images of skeletal structures submitted. Frontal view of the chest shows no destructive bony lesions. There is small left pleural effusion with left basilar atelectasis or infiltrate. Frontal and lateral view of the skull shows no lytic or blastic bone lesions. Frontal view bilateral shoulder and bilateral humerus shows no lytic or blastic lesions. Frontal and lateral view of the cervical spine shows the alignment preserved. No lytic or blastic lesions.  Frontal and lateral view of thoracic spine and lumbar spine shows alignment preserved. No lytic or blastic lesions. Frontal view bilateral hips and femur shows no lytic or blastic lesions. Frontal view bilateral tibia and fibula shows no lytic or blastic lesions. Bilateral forearm shows no lytic or blastic lesion.  IMPRESSION: No lytic or blastic lesions are identified. No evidence of metastatic disease. There is small left pleural effusion left basilar atelectasis or infiltrate.   Electronically Signed   By: Lahoma Crocker M.D.   On: 03/28/2015 14:27    ASSESSMENT AND PLAN  59yo who presented with scrotal swelling and generalized edema. The patient was found to have Elevated creatinine and evidence of heart failure. CArdiology was consulted.The patient was started on IV lasix. His creatinine started to rise on 4/20.  Acute on chronic systolic and diastolic congestive heart failure -He has been on IV lasix 69m BID. Weight currently  238 lbs down from 256 lbs at admission (neg 17 lbs) and Net neg 7L. Continue Lasix 81mIV BID as he still has evidence of volume overload. - 2-D echo with ejection fraction 4578-58%ith LV diastolic dysfunction -TSH within normal limits  Acute kidney injury, creatinine currently 4.7 - Last known Scr was 1.26 on 12/10/13.Nephrology on board. Now with increase in Scr  in setting of decompensated diastolic and systolic CHF with diuresis. Will warrant further work up and possible biopsy depending on 24 hour urine protein and serologies. No indication for dialysis at this time.  Per nephrology okay to continue IV diuresis. - ACE inhibitor has been discontinued due to AKI  Hypertension , blood pressure improved on increased BB. -Cont Metoprolol 75 mg BID and Hydralazine 5029mID. -Continue imdur -ACEI held due to AKI  DM- per IM   M spike positive, Undergoing work up for MM.   PhiThayer Headingsr.Brooke BonitoMD, FACNocona General Hospital23/2016, 10:31 AM [id number] [phone]

## 2015-03-29 NOTE — Progress Notes (Addendum)
[facility] PROGRESS NOTE  James [name] OZH:[id number] DOB: 07/16/1956 DOA: 03/22/2015 PCP: No primary care provider on file.  Brief Summary  59yo who presented with scrotal swelling and generalized edema. The patient was found to have elevated creatinine and evidence of heart failure.   Cardiology was consulted and started IV lasix.  Records demonstrated he had sharp rise in creatinine over the last few months.  Nephrology was consulted.  On his SPEP he had an M spike. UPEP is completing urine collection today. It is possible that his acute kidney injury is secondary to multiple myeloma as opposed to hypertension and diabetes. Nephrology recommended pursuing bone marrow biopsy instead of kidney biopsy showed his confirmatory test such as IFE,/lambda light chain, beta-2 microglobulin return positive.  Bone survery was negative.    Assessment/Plan  Acute hypoxic respiratory failure secondary to acute on chronic systolic and diastolic congestive heart failure -   Weight currently 109.9 kg down from 116 kg at admission -   +738mL over last 24 hours and has not voided so far today -   2-D echo with ejection fraction 45-50% with LV diastolic dysfunction -   TSH within normal limits -   Appreciate cardiology assistance -   Agree with increasing lasix  -  Wean oxygen as tolerated -  Tele:  NSR, okay to d/c.   Acute kidney injury, creatinine was 1.2 in January 2015 but his creatinine was 3.5 in February 2016.  May be due to DM and HTN, but concern for MM.  Creatinine trending up -   Appreciate Nephrology assistance -   Avoid ACE/ARB -   Urine protein to creatinine 7.51, 24-hour urine protein 5712 but UA with low protein (albumin).  This is consistent with MM. -   IFE pending  Probable multiple myeloma with elevated M spike on SPEP -  Follow-up UPEP -  Beta-2 microglobulin, cryoglobulin, kappa/lambda  -  Bone survey negative  Diabetes mellitus type 2 with renal complications, CBGs at  goal -   Continue Levemir 12 units with sliding scale insulin  Hypertension, blood pressure still elevated -   Continue Metoprolol75mg  twice a day -   Continue hydralazine to 100 mg TID -   Continue imdur -   ACEI held due to AKI  Iron deficiency anemia and probable multiple myeloma -  Started iron supplementation -  Avoid epo for now while working up MM -  GI follow up -  Folate and b12 wnl  Secondary hyperparathyroidism -  Started on calcitriol  Diet:   2 g sodium Access:   PIV IVF:   off Proph:   heparin  Code Status:  full Family Communication:  Patient alone Disposition Plan:  Pending further diuresis and work up for MM   Consultants:   cardiology    nephrology  Procedures:   echo  Antibiotics:   none   HPI/Subjective:  Patient has not P since receiving his Lasix this morning. He continues to have significant swelling in his legs and scrotum which is uncomfortable. Denies shortness of breath.   Objective: Filed Vitals:   03/28/15 2144 03/29/15 0540 03/29/15 0958 03/29/15 1345  BP: 153/82 113/84 150/79 126/67  Pulse: 77 79  76  Temp: 98.3 F (36.8 C) 98.9 F (37.2 C)  99.5 F (37.5 C)  TempSrc: Oral Oral  Oral  Resp: 18 16  16   Height:      Weight:  108.364 kg (238 lb 14.4 oz)    SpO2: 96% 99%  94%    Intake/Output Summary (Last 24 hours) at 03/29/15 1346 Last data filed at 03/29/15 1317  Gross per 24 hour  Intake   2393 ml  Output   1575 ml  Net    818 ml   Filed Weights   03/26/15 0553 03/28/15 0628 03/29/15 0540  Weight: 109.907 kg (242 lb 4.8 oz) 108.727 kg (239 lb 11.2 oz) 108.364 kg (238 lb 14.4 oz)    Exam:   General:   Obese male,No acute distress, sitting at edge of bed with nasal cannula in place  HEENT:  NCAT, MMM  Cardiovascular:  RRR, nl S1, S2 no mrg, 2+ pulses, warm extremities  Respiratory:  CTAB, no increased WOB  Abdomen:   NABS, soft, NT/ND  MSK:   Normal tone and bulk, 2+ slow pitting bilateral  LEE  Neuro:  Grossly intact  GU:  Enlarged/swollen scrotum  Data Reviewed: Basic Metabolic Panel:  Recent Labs Lab 03/23/15 0727  03/24/15 0800 03/25/15 0457 03/26/15 0324 03/27/15 0538 03/28/15 0430 03/29/15 0559  NA  --   < >  --  139 138 139 136 137  K  --   < >  --  4.1 4.2 4.4 4.3 4.5  CL  --   < >  --  104 101 101 98 97  CO2  --   < >  --  28 30 30  32 32  GLUCOSE  --   < >  --  122* 120* 110* 142* 162*  BUN  --   < >  --  29* 30* 33* 34* 38*  CREATININE  --   < >  --  4.59* 4.66* 4.62* 4.72* 5.04*  CALCIUM  --   < >  --  7.6* 7.7* 8.0* 7.9* 8.1*  MG 1.6  --  1.9  --   --   --   --   --   PHOS  --   --   --   --   --  5.7* 5.9* 6.5*  < > = values in this interval not displayed. Liver Function Tests:  Recent Labs Lab 03/23/15 0330 03/27/15 0538 03/28/15 0430 03/29/15 0559  AST 29  --   --   --   ALT 23  --   --   --   ALKPHOS 159*  --   --   --   BILITOT 0.6  --   --   --   PROT 7.1  --   --   --   ALBUMIN 1.7* 1.7* 1.6* 1.7*   No results for input(s): LIPASE, AMYLASE in the last 168 hours. No results for input(s): AMMONIA in the last 168 hours. CBC:  Recent Labs Lab 03/22/15 1903 03/29/15 1106  WBC 5.6 6.1  HGB 8.1* 7.5*  HCT 24.7* 23.6*  MCV 86.4 88.4  PLT 310 278   Cardiac Enzymes:  Recent Labs Lab 03/26/15 1614  CKTOTAL 380*   BNP (last 3 results)  Recent Labs  03/22/15 1903  BNP 1043.8*    ProBNP (last 3 results) No results for input(s): PROBNP in the last 8760 hours.  CBG:  Recent Labs Lab 03/28/15 1114 03/28/15 1556 03/28/15 2147 03/29/15 0548 03/29/15 1117  GLUCAP 123* 168* 188* 151* 143*    No results found for this or any previous visit (from the past 240 hour(s)).   Studies: Dg Bone Survey Met  03/28/2015   CLINICAL DATA:  Metastatic bone survey, multiple myeloma  EXAM: METASTATIC BONE SURVEY  COMPARISON:  03/22/2015  FINDINGS: Twenty-three images of skeletal structures submitted. Frontal view of the chest  shows no destructive bony lesions. There is small left pleural effusion with left basilar atelectasis or infiltrate. Frontal and lateral view of the skull shows no lytic or blastic bone lesions. Frontal view bilateral shoulder and bilateral humerus shows no lytic or blastic lesions. Frontal and lateral view of the cervical spine shows the alignment preserved. No lytic or blastic lesions.  Frontal and lateral view of thoracic spine and lumbar spine shows alignment preserved. No lytic or blastic lesions. Frontal view bilateral hips and femur shows no lytic or blastic lesions. Frontal view bilateral tibia and fibula shows no lytic or blastic lesions. Bilateral forearm shows no lytic or blastic lesion.  IMPRESSION: No lytic or blastic lesions are identified. No evidence of metastatic disease. There is small left pleural effusion left basilar atelectasis or infiltrate.   Electronically Signed   By: Natasha Mead M.D.   On: 03/28/2015 14:27    Scheduled Meds: . calcitRIOL  0.25 mcg Oral Q M,W,F  . ferrous sulfate  325 mg Oral Q breakfast  . furosemide  120 mg Intravenous BID  . gabapentin  400 mg Oral BID  . heparin  5,000 Units Subcutaneous 3 times per day  . hydrALAZINE  100 mg Oral TID  . insulin aspart  0-9 Units Subcutaneous TID WC  . insulin detemir  12 Units Subcutaneous QHS  . isosorbide mononitrate  30 mg Oral Daily  . metoprolol tartrate  75 mg Oral BID  . sodium chloride  3 mL Intravenous Q12H   Continuous Infusions:   Principal Problem:   CHF (congestive heart failure) Active Problems:   Peripheral edema   HTN (hypertension)   DM (diabetes mellitus)   Acute renal failure   Anemia   Hypokalemia   Congestive heart disease   Hypoxia   Acute on chronic diastolic CHF (congestive heart failure), NYHA class 4   Multiple myeloma    Time spent: 30 min    Brinlyn Cena  [facility] Pager [phone]. If 7PM-7AM, please contact night-coverage at [email or web address], password  [facility] 03/29/2015, 1:46 PM  LOS: 7 days

## 2015-03-29 NOTE — Progress Notes (Signed)
Patient ID: James [name], male   DOB: Nov 02, 1956, 59 y.o.   MRN: [id number] S:feels better O:BP 113/84 mmHg  Pulse 79  Temp(Src) 98.9 F (37.2 C) (Oral)  Resp 16  Ht 5\' 5"  (1.651 m)  Wt 108.364 kg (238 lb 14.4 oz)  BMI 39.75 kg/m2  SpO2 99%  Intake/Output Summary (Last 24 hours) at 03/29/15 0855 Last data filed at 03/29/15 0836  Gross per 24 hour  Intake   2273 ml  Output   2075 ml  Net    198 ml   Intake/Output: I/O last 3 completed shifts: In: 2615 [P.O.:2615] Out: 2820 [Urine:2820]  Intake/Output this shift:  Total I/O In: 598 [P.O.:598] Out: 525 [Urine:525] Weight change: -0.363 kg (-12.8 oz) Gen:WD WN AAM in NAD CVS:no rub Resp:cta WUJ:WJXBJY Ext:3+edema of lower extremities   Recent Labs Lab 03/23/15 0330 03/24/15 0411 03/25/15 0457 03/26/15 0324 03/27/15 0538 03/28/15 0430 03/29/15 0559  NA 141 142 139 138 139 136 137  K 3.3* 4.3 4.1 4.2 4.4 4.3 4.5  CL 103 105 104 101 101 98 97  CO2 27 28 28 30 30  32 32  GLUCOSE 114* 136* 122* 120* 110* 142* 162*  BUN 26* 27* 29* 30* 33* 34* 38*  CREATININE 4.48* 4.54* 4.59* 4.66* 4.62* 4.72* 5.04*  ALBUMIN 1.7*  --   --   --  1.7* 1.6* 1.7*  CALCIUM 7.5* 7.5* 7.6* 7.7* 8.0* 7.9* 8.1*  PHOS  --   --   --   --  5.7* 5.9* 6.5*  AST 29  --   --   --   --   --   --   ALT 23  --   --   --   --   --   --    Liver Function Tests:  Recent Labs Lab 03/23/15 0330 03/27/15 0538 03/28/15 0430 03/29/15 0559  AST 29  --   --   --   ALT 23  --   --   --   ALKPHOS 159*  --   --   --   BILITOT 0.6  --   --   --   PROT 7.1  --   --   --   ALBUMIN 1.7* 1.7* 1.6* 1.7*   No results for input(s): LIPASE, AMYLASE in the last 168 hours. No results for input(s): AMMONIA in the last 168 hours. CBC:  Recent Labs Lab 03/22/15 1903  WBC 5.6  HGB 8.1*  HCT 24.7*  MCV 86.4  PLT 310   Cardiac Enzymes:  Recent Labs Lab 03/26/15 1614  CKTOTAL 380*   CBG:  Recent Labs Lab 03/28/15 0612 03/28/15 1114 03/28/15 1556  03/28/15 2147 03/29/15 0548  GLUCAP 112* 123* 168* 188* 151*    Iron Studies: No results for input(s): IRON, TIBC, TRANSFERRIN, FERRITIN in the last 72 hours. Studies/Results: Dg Bone Survey Met  03/28/2015   CLINICAL DATA:  Metastatic bone survey, multiple myeloma  EXAM: METASTATIC BONE SURVEY  COMPARISON:  03/22/2015  FINDINGS: Twenty-three images of skeletal structures submitted. Frontal view of the chest shows no destructive bony lesions. There is small left pleural effusion with left basilar atelectasis or infiltrate. Frontal and lateral view of the skull shows no lytic or blastic bone lesions. Frontal view bilateral shoulder and bilateral humerus shows no lytic or blastic lesions. Frontal and lateral view of the cervical spine shows the alignment preserved. No lytic or blastic lesions.  Frontal and lateral view of thoracic spine and lumbar spine shows  alignment preserved. No lytic or blastic lesions. Frontal view bilateral hips and femur shows no lytic or blastic lesions. Frontal view bilateral tibia and fibula shows no lytic or blastic lesions. Bilateral forearm shows no lytic or blastic lesion.  IMPRESSION: No lytic or blastic lesions are identified. No evidence of metastatic disease. There is small left pleural effusion left basilar atelectasis or infiltrate.   Electronically Signed   By: Natasha Mead M.D.   On: 03/28/2015 14:27   . calcitRIOL  0.25 mcg Oral Q M,W,F  . ferrous sulfate  325 mg Oral Q breakfast  . furosemide  80 mg Intravenous BID  . gabapentin  400 mg Oral BID  . heparin  5,000 Units Subcutaneous 3 times per day  . hydrALAZINE  100 mg Oral TID  . insulin aspart  0-9 Units Subcutaneous TID WC  . insulin detemir  12 Units Subcutaneous QHS  . isosorbide mononitrate  30 mg Oral Daily  . metoprolol tartrate  75 mg Oral BID  . sodium chloride  3 mL Intravenous Q12H    BMET    Component Value Date/Time   NA 137 03/29/2015 0559   K 4.5 03/29/2015 0559   CL 97 03/29/2015  0559   CO2 32 03/29/2015 0559   GLUCOSE 162* 03/29/2015 0559   BUN 38* 03/29/2015 0559   CREATININE 5.04* 03/29/2015 0559   CALCIUM 8.1* 03/29/2015 0559   GFRNONAA 12* 03/29/2015 0559   GFRAA 13* 03/29/2015 0559   CBC    Component Value Date/Time   WBC 5.6 03/22/2015 1903   RBC 3.18* 03/23/2015 0330   RBC 2.86* 03/22/2015 1903   HGB 8.1* 03/22/2015 1903   HCT 24.7* 03/22/2015 1903   PLT 310 03/22/2015 1903   MCV 86.4 03/22/2015 1903   MCH 28.3 03/22/2015 1903   MCHC 32.8 03/22/2015 1903   RDW 14.1 03/22/2015 1903     Assessment/Plan: 1. Acute vs subacute kidney injury/CKD- last known Scr was 1.26 on 12/10/13 and Hgb was 12.7 at that time. Now with increase in Scr in setting of decompensated diastolic and systolic CHF with diuresis. Will warrant further work up and possible biopsy depending on 24 hour urine protein and serologies. No indication for dialysis at this time. 1. +Mspike on SPEP, unfortunately his 24 hour urine was initially discarded reordered 24 hour UPEP 2. Skeletal survey negative but he has a high likelihood of multiple myeoloma in light of AKI and anemia and +Mspike, may be cast nephropathy.    3. Await Heme/Onc eval and decide on bonemarrow biopsy +/- renal biopsy 2. Acute decompensated diastolic and systolic congestive heart failure- management per cardiology. Not diuresing well over the last 24 hours.  1. Will increase dose of lasix to 120mg  bid and follow 3. +M spike as above 4. HTN- improved 5. LVH- as above. Hold ACE/ARB for now 6. Anemia- possibly due to CKD (although his Scr was only 1.26 15 months ago and hgb 12.7 12/10/13). Will check SPEP/UPEP, guaiac stools and serologies and iron stores. 7. Secondary HPTH- presumably due to CKD.started calcitriol (iPTH 118).  8. Hypoalbuminemia- his albumin was 2.8 on 12/10/14 and now <2. R/o nephrotic syndrome (although spot urine with only 100mg  protein) also need to r/o myeloma or amyloid  Elisha Mcgruder  A

## 2015-03-30 LAB — KAPPA/LAMBDA LIGHT CHAINS
KAPPA FREE LGHT CHN: 115.29 mg/L — AB (ref 3.30–19.40)
KAPPA FREE LGHT CHN: 113.69 mg/L — AB (ref 3.30–19.40)
Kappa, lambda light chain ratio: 3.78 — ABNORMAL HIGH (ref 0.26–1.65)
Kappa, lambda light chain ratio: 3.83 — ABNORMAL HIGH (ref 0.26–1.65)
LAMDA FREE LIGHT CHAINS: 29.65 mg/L — AB (ref 5.71–26.30)
Lambda free light chains: 30.53 mg/L — ABNORMAL HIGH (ref 5.71–26.30)

## 2015-03-30 LAB — CBC
HEMATOCRIT: 33 % — AB (ref 39.0–52.0)
HEMOGLOBIN: 10.4 g/dL — AB (ref 13.0–17.0)
MCH: 27.6 pg (ref 26.0–34.0)
MCHC: 31.5 g/dL (ref 30.0–36.0)
MCV: 87.5 fL (ref 78.0–100.0)
PLATELETS: 221 10*3/uL (ref 150–400)
RBC: 3.77 MIL/uL — AB (ref 4.22–5.81)
RDW: 14.6 % (ref 11.5–15.5)
WBC: 4.5 10*3/uL (ref 4.0–10.5)

## 2015-03-30 LAB — GLUCOSE, CAPILLARY
GLUCOSE-CAPILLARY: 117 mg/dL — AB (ref 70–99)
GLUCOSE-CAPILLARY: 174 mg/dL — AB (ref 70–99)
Glucose-Capillary: 154 mg/dL — ABNORMAL HIGH (ref 70–99)
Glucose-Capillary: 151 mg/dL — ABNORMAL HIGH (ref 70–99)

## 2015-03-30 LAB — RENAL FUNCTION PANEL
ALBUMIN: 1.8 g/dL — AB (ref 3.5–5.2)
ANION GAP: 10 (ref 5–15)
BUN: 42 mg/dL — ABNORMAL HIGH (ref 6–23)
CHLORIDE: 96 mmol/L (ref 96–112)
CO2: 28 mmol/L (ref 19–32)
Calcium: 8 mg/dL — ABNORMAL LOW (ref 8.4–10.5)
Creatinine, Ser: 5.22 mg/dL — ABNORMAL HIGH (ref 0.50–1.35)
GFR calc Af Amer: 13 mL/min — ABNORMAL LOW (ref >90)
GFR calc non Af Amer: 11 mL/min — ABNORMAL LOW (ref >90)
Glucose, Bld: 117 mg/dL — ABNORMAL HIGH (ref 70–99)
PHOSPHORUS: 5.9 mg/dL — AB (ref 2.3–4.6)
Potassium: 4.6 mmol/L (ref 3.5–5.1)
Sodium: 134 mmol/L — ABNORMAL LOW (ref 135–145)

## 2015-03-30 LAB — BETA 2 MICROGLOBULIN, SERUM: Beta-2 Microglobulin: 8.3 mg/L — ABNORMAL HIGH (ref 0.6–2.4)

## 2015-03-30 MED ORDER — METOPROLOL TARTRATE 100 MG PO TABS
100.0000 mg | ORAL_TABLET | Freq: Two times a day (BID) | ORAL | Status: DC
  Administered 2015-03-30: 100 mg via ORAL
  Filled 2015-03-30 (×3): qty 1

## 2015-03-30 MED ORDER — GABAPENTIN 300 MG PO CAPS
300.0000 mg | ORAL_CAPSULE | Freq: Every day | ORAL | Status: DC
  Administered 2015-03-31 – 2015-04-11 (×12): 300 mg via ORAL
  Filled 2015-03-30 (×13): qty 1

## 2015-03-30 MED ORDER — FUROSEMIDE 10 MG/ML IJ SOLN
120.0000 mg | Freq: Three times a day (TID) | INTRAVENOUS | Status: DC
  Administered 2015-03-30 – 2015-04-01 (×4): 120 mg via INTRAVENOUS
  Filled 2015-03-30 (×8): qty 12

## 2015-03-30 MED ORDER — METOPROLOL TARTRATE 100 MG PO TABS
100.0000 mg | ORAL_TABLET | Freq: Two times a day (BID) | ORAL | Status: DC
  Administered 2015-03-31 – 2015-04-06 (×11): 100 mg via ORAL
  Filled 2015-03-30 (×17): qty 1

## 2015-03-30 NOTE — Progress Notes (Addendum)
[facility] PROGRESS NOTE  James [name] MRN:[id number] DOB: 12/17/1955 DOA: 03/22/2015 PCP: No primary care provider on file.  Brief Summary  59yo who presented with scrotal swelling and generalized edema. The patient was found to have elevated creatinine and evidence of heart failure.   Cardiology was consulted and started IV lasix.  Records demonstrated he had sharp rise in creatinine over the last few months.  Nephrology was consulted.  On his SPEP he had an M spike. UPEP is completing urine collection today. It is possible that his acute kidney injury is secondary to multiple myeloma as opposed to hypertension and diabetes. Nephrology recommended pursuing bone marrow biopsy instead of kidney biopsy showed his confirmatory test such as IFE,/lambda light chain, beta-2 microglobulin return positive.  Bone survery was negative.  Discordant protein on urinalysis compared to total protein collected from 24-hour urine collection suspicious for multiple myeloma.    Assessment/Plan  Acute hypoxic respiratory failure secondary to acute on chronic systolic and diastolic congestive heart failure -   Weight currently 109.9 kg down from 116 kg at admission -   +700mL over last 24 hours and has not voided so far today -   2-D echo with ejection fraction 45-50% with LV diastolic dysfunction -   TSH within normal limits -   Appreciate cardiology assistance -   Lasix per cardiology -  Tele:  NSR, okay to d/c.   Acute kidney injury, creatinine was 1.2 in January 2015 but his creatinine was 3.5 in February 2016.  May be due to DM and HTN, but concern for MM.  Creatinine trending up -   Appreciate Nephrology assistance -   Avoid ACE/ARB -   Urine protein to creatinine 7.51, 24-hour urine protein 5712 but UA with low protein (albumin).  This is consistent with MM. -   IFE pending  Probable multiple myeloma with elevated M spike on SPEP -  Follow-up UPEP -  Beta-2 microglobulin, cryoglobulin  pending -  Kappa/lambda ratio mildly elevated 3.83 (kappa 114, lambda 30) -  Bone survey negative -  I tried calling Dr. Feng, Oncology, twice daily and am awaiting a call back.   Diabetes mellitus type 2 with renal complications, CBGs at goal -   Continue Levemir 12 units with sliding scale insulin  Hypertension, blood pressure still elevated -   Increase Metoprolol to 100 mg twice a day -   Continue hydralazine to 100 mg TID -   Continue imdur -   ACEI held due to AKI  Iron deficiency anemia and probable multiple myeloma -  Started iron supplementation -  Avoid epo for now while working up MM -  GI follow up -  Folate and b12 wnl  Secondary hyperparathyroidism -  Started on calcitriol  Diet:   2 g sodium Access:   PIV IVF:   off Proph:   heparin  Code Status:  full Family Communication:  Patient alone Disposition Plan:  Pending further diuresis and work up for MM   Consultants:   cardiology    nephrology  IR for bone marrow biopsy  Procedures:   echo  Antibiotics:   none   HPI/Subjective:  Patient states his swelling has improved. Denies shortness of breath.   Objective: Filed Vitals:   03/29/15 1345 03/29/15 2026 03/30/15 0557 03/30/15 1002  BP: 126/67 128/62 131/72 144/89  Pulse: 76 76 75   Temp: 99.5 F (37.5 C) 98.1 F (36.7 C) 98.1 F (36.7 C)   TempSrc: Oral Oral Oral     [facility] PROGRESS NOTE  James [name] MRN:[id number] DOB: 12/17/1955 DOA: 03/22/2015 PCP: No primary care provider on file.  Brief Summary  59yo who presented with scrotal swelling and generalized edema. The patient was found to have elevated creatinine and evidence of heart failure.   Cardiology was consulted and started IV lasix.  Records demonstrated he had sharp rise in creatinine over the last few months.  Nephrology was consulted.  On his SPEP he had an M spike. UPEP is completing urine collection today. It is possible that his acute kidney injury is secondary to multiple myeloma as opposed to hypertension and diabetes. Nephrology recommended pursuing bone marrow biopsy instead of kidney biopsy showed his confirmatory test such as IFE,/lambda light chain, beta-2 microglobulin return positive.  Bone survery was negative.  Discordant protein on urinalysis compared to total protein collected from 24-hour urine collection suspicious for multiple myeloma.    Assessment/Plan  Acute hypoxic respiratory failure secondary to acute on chronic systolic and diastolic congestive heart failure -   Weight currently 109.9 kg down from 116 kg at admission -   +700mL over last 24 hours and has not voided so far today -   2-D echo with ejection fraction 45-50% with LV diastolic dysfunction -   TSH within normal limits -   Appreciate cardiology assistance -   Lasix per cardiology -  Tele:  NSR, okay to d/c.   Acute kidney injury, creatinine was 1.2 in January 2015 but his creatinine was 3.5 in February 2016.  May be due to DM and HTN, but concern for MM.  Creatinine trending up -   Appreciate Nephrology assistance -   Avoid ACE/ARB -   Urine protein to creatinine 7.51, 24-hour urine protein 5712 but UA with low protein (albumin).  This is consistent with MM. -   IFE pending  Probable multiple myeloma with elevated M spike on SPEP -  Follow-up UPEP -  Beta-2 microglobulin, cryoglobulin  pending -  Kappa/lambda ratio mildly elevated 3.83 (kappa 114, lambda 30) -  Bone survey negative -  I tried calling Dr. Feng, Oncology, twice daily and am awaiting a call back.   Diabetes mellitus type 2 with renal complications, CBGs at goal -   Continue Levemir 12 units with sliding scale insulin  Hypertension, blood pressure still elevated -   Increase Metoprolol to 100 mg twice a day -   Continue hydralazine to 100 mg TID -   Continue imdur -   ACEI held due to AKI  Iron deficiency anemia and probable multiple myeloma -  Started iron supplementation -  Avoid epo for now while working up MM -  GI follow up -  Folate and b12 wnl  Secondary hyperparathyroidism -  Started on calcitriol  Diet:   2 g sodium Access:   PIV IVF:   off Proph:   heparin  Code Status:  full Family Communication:  Patient alone Disposition Plan:  Pending further diuresis and work up for MM   Consultants:   cardiology    nephrology  IR for bone marrow biopsy  Procedures:   echo  Antibiotics:   none   HPI/Subjective:  Patient states his swelling has improved. Denies shortness of breath.   Objective: Filed Vitals:   03/29/15 1345 03/29/15 2026 03/30/15 0557 03/30/15 1002  BP: 126/67 128/62 131/72 144/89  Pulse: 76 76 75   Temp: 99.5 F (37.5 C) 98.1 F (36.7 C) 98.1 F (36.7 C)   TempSrc: Oral Oral Oral     [facility] PROGRESS NOTE  James [name] MRN:[id number] DOB: 12/17/1955 DOA: 03/22/2015 PCP: No primary care provider on file.  Brief Summary  59yo who presented with scrotal swelling and generalized edema. The patient was found to have elevated creatinine and evidence of heart failure.   Cardiology was consulted and started IV lasix.  Records demonstrated he had sharp rise in creatinine over the last few months.  Nephrology was consulted.  On his SPEP he had an M spike. UPEP is completing urine collection today. It is possible that his acute kidney injury is secondary to multiple myeloma as opposed to hypertension and diabetes. Nephrology recommended pursuing bone marrow biopsy instead of kidney biopsy showed his confirmatory test such as IFE,/lambda light chain, beta-2 microglobulin return positive.  Bone survery was negative.  Discordant protein on urinalysis compared to total protein collected from 24-hour urine collection suspicious for multiple myeloma.    Assessment/Plan  Acute hypoxic respiratory failure secondary to acute on chronic systolic and diastolic congestive heart failure -   Weight currently 109.9 kg down from 116 kg at admission -   +700mL over last 24 hours and has not voided so far today -   2-D echo with ejection fraction 45-50% with LV diastolic dysfunction -   TSH within normal limits -   Appreciate cardiology assistance -   Lasix per cardiology -  Tele:  NSR, okay to d/c.   Acute kidney injury, creatinine was 1.2 in January 2015 but his creatinine was 3.5 in February 2016.  May be due to DM and HTN, but concern for MM.  Creatinine trending up -   Appreciate Nephrology assistance -   Avoid ACE/ARB -   Urine protein to creatinine 7.51, 24-hour urine protein 5712 but UA with low protein (albumin).  This is consistent with MM. -   IFE pending  Probable multiple myeloma with elevated M spike on SPEP -  Follow-up UPEP -  Beta-2 microglobulin, cryoglobulin  pending -  Kappa/lambda ratio mildly elevated 3.83 (kappa 114, lambda 30) -  Bone survey negative -  I tried calling Dr. Feng, Oncology, twice daily and am awaiting a call back.   Diabetes mellitus type 2 with renal complications, CBGs at goal -   Continue Levemir 12 units with sliding scale insulin  Hypertension, blood pressure still elevated -   Increase Metoprolol to 100 mg twice a day -   Continue hydralazine to 100 mg TID -   Continue imdur -   ACEI held due to AKI  Iron deficiency anemia and probable multiple myeloma -  Started iron supplementation -  Avoid epo for now while working up MM -  GI follow up -  Folate and b12 wnl  Secondary hyperparathyroidism -  Started on calcitriol  Diet:   2 g sodium Access:   PIV IVF:   off Proph:   heparin  Code Status:  full Family Communication:  Patient alone Disposition Plan:  Pending further diuresis and work up for MM   Consultants:   cardiology    nephrology  IR for bone marrow biopsy  Procedures:   echo  Antibiotics:   none   HPI/Subjective:  Patient states his swelling has improved. Denies shortness of breath.   Objective: Filed Vitals:   03/29/15 1345 03/29/15 2026 03/30/15 0557 03/30/15 1002  BP: 126/67 128/62 131/72 144/89  Pulse: 76 76 75   Temp: 99.5 F (37.5 C) 98.1 F (36.7 C) 98.1 F (36.7 C)   TempSrc: Oral Oral Oral   

## 2015-03-30 NOTE — Progress Notes (Signed)
PROGRESS NOTE  Problem List: Patient Active Problem List   Diagnosis Date Noted  . Multiple myeloma   . Acute on chronic diastolic CHF (congestive heart failure), NYHA class 4   . Hypoxia   . Congestive heart disease   . Hypokalemia 03/23/2015  . Peripheral edema 03/22/2015  . CHF (congestive heart failure) 03/22/2015  . HTN (hypertension) 03/22/2015  . DM (diabetes mellitus) 03/22/2015  . Acute renal failure 03/22/2015  . Anemia 03/22/2015    Subjective:    Pt is slowly  Improving.   Did not diurese much yesterday . Lasix was increased to 120 IV BID Renal is following    Objective:    Vital Signs:   Temp:  [98.1 F (36.7 C)-99.5 F (37.5 C)] 98.1 F (36.7 C) (04/24 0557) Pulse Rate:  [75-76] 75 (04/24 0557) Resp:  [16-18] 18 (04/24 0557) BP: (126-150)/(62-79) 131/72 mmHg (04/24 0557) SpO2:  [94 %-95 %] 95 % (04/24 0557) Weight:  [239 lb 3.2 oz (108.5 kg)] 239 lb 3.2 oz (108.5 kg) (04/24 0557)  Last BM Date: 03/29/15   24-hour weight change: Weight change: 4.8 oz (0.136 kg)  Weight trends: Filed Weights   03/28/15 0628 03/29/15 0540 03/30/15 0557  Weight: 239 lb 11.2 oz (108.727 kg) 238 lb 14.4 oz (108.364 kg) 239 lb 3.2 oz (108.5 kg)    Intake/Output:  04/23 0701 - 04/24 0700 In: 3818 [P.O.:1568; IV Piggyback:124] Out: 1625 [Urine:1625]     Physical Exam: BP 131/72 mmHg  Pulse 75  Temp(Src) 98.1 F (36.7 C) (Oral)  Resp 18  Ht 5' 5"  (1.651 m)  Wt 239 lb 3.2 oz (108.5 kg)  BMI 39.80 kg/m2  SpO2 95%  Wt Readings from Last 3 Encounters:  03/30/15 239 lb 3.2 oz (108.5 kg)    General: Vital signs reviewed and noted.   Head: Normocephalic, atraumatic.  Eyes: conjunctivae/corneas clear.  EOM's intact.   Throat: normal  Neck:  thick, normal   Lungs:    cleear   Heart:  RR   Abdomen:  Soft, non-tender, non-distended  obese  Extremities:  2+ bilateral pitting edema    Neurologic: A&O X3, CN II - XII are grossly intact.   Psych: Normal       Labs: BMET:  Recent Labs  03/29/15 0559 03/30/15 0745  NA 137 134*  K 4.5 4.6  CL 97 96  CO2 32 28  GLUCOSE 162* 117*  BUN 38* 42*  CREATININE 5.04* 5.22*  CALCIUM 8.1* 8.0*  PHOS 6.5* 5.9*    Liver function tests:  Recent Labs  03/29/15 0559 03/30/15 0745  ALBUMIN 1.7* 1.8*   No results for input(s): LIPASE, AMYLASE in the last 72 hours.  CBC:  Recent Labs  03/29/15 1106 03/30/15 0745  WBC 6.1 4.5  HGB 7.5* 10.4*  HCT 23.6* 33.0*  MCV 88.4 87.5  PLT 278 221    Cardiac Enzymes: No results for input(s): CKTOTAL, CKMB, TROPONINI in the last 72 hours.  Coagulation Studies: No results for input(s): LABPROT, INR in the last 72 hours.  Other: Invalid input(s): POCBNP No results for input(s): DDIMER in the last 72 hours. No results for input(s): HGBA1C in the last 72 hours. No results for input(s): CHOL, HDL, LDLCALC, TRIG, CHOLHDL in the last 72 hours. No results for input(s): TSH, T4TOTAL, T3FREE, THYROIDAB in the last 72 hours.  Invalid input(s): FREET3 No results for input(s): VITAMINB12, FOLATE, FERRITIN, TIBC, IRON, RETICCTPCT in the last 72 hours.  Other results:  Not on tele   Medications:    Infusions:    Scheduled Medications: . calcitRIOL  0.25 mcg Oral Q M,W,F  . ferrous sulfate  325 mg Oral Q breakfast  . furosemide  120 mg Intravenous BID  . gabapentin  400 mg Oral BID  . heparin  5,000 Units Subcutaneous 3 times per day  . hydrALAZINE  100 mg Oral TID  . hydrocerin   Topical BID  . insulin aspart  0-9 Units Subcutaneous TID WC  . insulin detemir  12 Units Subcutaneous QHS  . isosorbide mononitrate  30 mg Oral Daily  . metoprolol tartrate  75 mg Oral BID  . sodium chloride  3 mL Intravenous Q12H    Assessment/ Plan:   Principal Problem:   CHF (congestive heart failure) Active Problems:   Peripheral edema   HTN (hypertension)   DM (diabetes mellitus)   Acute renal failure   Anemia   Hypokalemia   Congestive heart  disease   Hypoxia   Acute on chronic diastolic CHF (congestive heart failure), NYHA class 4   Multiple myeloma  59yo who presented with scrotal swelling and generalized edema. The patient was found to have Elevated creatinine and evidence of heart failure. CArdiology was consulted.The patient was started on IV lasix. His creatinine started to rise on 4/20.  Acute on chronic systolic and diastolic congestive heart failure -He has been on IV lasix 120 mg BID.  Will have nephrology adjust diuretics at this point, No putting out much urine.  May need to start dialysis.  - 2-D echo with ejection fraction 73-56% with LV diastolic dysfunction -TSH within normal limits  Acute kidney injury, creatinine currently 4.7 - Last known Scr was 1.26 on 12/10/13.Nephrology on board. Now with increase in Scr in setting of decompensated diastolic and systolic CHF with diuresis. Will warrant further work up and possible biopsy depending on 24 hour urine protein and serologies. No indication for dialysis at this time. Per nephrology okay to continue IV diuresis. - ACE inhibitor has been discontinued due to AKI  Hypertension , blood pressure improved on increased BB. -Cont Metoprolol 75 mg BID and Hydralazine 9m TID. -Continue imdur -ACEI held due to AKI  DM- per IM  .  Disposition:  Length of Stay: 8  PThayer Headings JBrooke Bonito, MD, FColima Endoscopy Center Inc4/24/2016, 9:53 AM Office [phone] [phone]

## 2015-03-30 NOTE — Progress Notes (Signed)
Patient ID: James [name], male   DOB: 23-Aug-1956, 59 y.o.   MRN: [id number] S:feels well O:BP 131/72 mmHg  Pulse 75  Temp(Src) 98.1 F (36.7 C) (Oral)  Resp 18  Ht 5\' 5"  (1.651 m)  Wt 108.5 kg (239 lb 3.2 oz)  BMI 39.80 kg/m2  SpO2 95%  Intake/Output Summary (Last 24 hours) at 03/30/15 0848 Last data filed at 03/30/15 0600  Gross per 24 hour  Intake   1094 ml  Output   1100 ml  Net     -6 ml   Intake/Output: I/O last 3 completed shifts: In: 2412 [P.O.:2288; IV Piggyback:124] Out: 2175 [Urine:2175]  Intake/Output this shift:    Weight change: 0.136 kg (4.8 oz) Gen:WD WN AAM in NAD CVS:no rub Resp:cta with decreased BS at bases Abd:+BS, soft, nT Ext:3+ edema lower ext   Recent Labs Lab 03/24/15 0411 03/25/15 0457 03/26/15 0324 03/27/15 0538 03/28/15 0430 03/29/15 0559 03/30/15 0745  NA 142 139 138 139 136 137 134*  K 4.3 4.1 4.2 4.4 4.3 4.5 4.6  CL 105 104 101 101 98 97 96  CO2 28 28 30 30  32 32 28  GLUCOSE 136* 122* 120* 110* 142* 162* 117*  BUN 27* 29* 30* 33* 34* 38* 42*  CREATININE 4.54* 4.59* 4.66* 4.62* 4.72* 5.04* 5.22*  ALBUMIN  --   --   --  1.7* 1.6* 1.7* 1.8*  CALCIUM 7.5* 7.6* 7.7* 8.0* 7.9* 8.1* 8.0*  PHOS  --   --   --  5.7* 5.9* 6.5* 5.9*   Liver Function Tests:  Recent Labs Lab 03/28/15 0430 03/29/15 0559 03/30/15 0745  ALBUMIN 1.6* 1.7* 1.8*   No results for input(s): LIPASE, AMYLASE in the last 168 hours. No results for input(s): AMMONIA in the last 168 hours. CBC:  Recent Labs Lab 03/29/15 1106 03/30/15 0745  WBC 6.1 4.5  HGB 7.5* 10.4*  HCT 23.6* 33.0*  MCV 88.4 87.5  PLT 278 221   Cardiac Enzymes:  Recent Labs Lab 03/26/15 1614  CKTOTAL 380*   CBG:  Recent Labs Lab 03/29/15 0548 03/29/15 1117 03/29/15 1607 03/29/15 2109 03/30/15 0623  GLUCAP 151* 143* 171* 166* 117*    Iron Studies: No results for input(s): IRON, TIBC, TRANSFERRIN, FERRITIN in the last 72 hours. Studies/Results: Dg Bone Survey  Met  03/28/2015   CLINICAL DATA:  Metastatic bone survey, multiple myeloma  EXAM: METASTATIC BONE SURVEY  COMPARISON:  03/22/2015  FINDINGS: Twenty-three images of skeletal structures submitted. Frontal view of the chest shows no destructive bony lesions. There is small left pleural effusion with left basilar atelectasis or infiltrate. Frontal and lateral view of the skull shows no lytic or blastic bone lesions. Frontal view bilateral shoulder and bilateral humerus shows no lytic or blastic lesions. Frontal and lateral view of the cervical spine shows the alignment preserved. No lytic or blastic lesions.  Frontal and lateral view of thoracic spine and lumbar spine shows alignment preserved. No lytic or blastic lesions. Frontal view bilateral hips and femur shows no lytic or blastic lesions. Frontal view bilateral tibia and fibula shows no lytic or blastic lesions. Bilateral forearm shows no lytic or blastic lesion.  IMPRESSION: No lytic or blastic lesions are identified. No evidence of metastatic disease. There is small left pleural effusion left basilar atelectasis or infiltrate.   Electronically Signed   By: Natasha Mead M.D.   On: 03/28/2015 14:27   . calcitRIOL  0.25 mcg Oral Q M,W,F  . ferrous sulfate  325  mg Oral Q breakfast  . furosemide  120 mg Intravenous BID  . gabapentin  400 mg Oral BID  . heparin  5,000 Units Subcutaneous 3 times per day  . hydrALAZINE  100 mg Oral TID  . hydrocerin   Topical BID  . insulin aspart  0-9 Units Subcutaneous TID WC  . insulin detemir  12 Units Subcutaneous QHS  . isosorbide mononitrate  30 mg Oral Daily  . metoprolol tartrate  75 mg Oral BID  . sodium chloride  3 mL Intravenous Q12H    BMET    Component Value Date/Time   NA 134* 03/30/2015 0745   K 4.6 03/30/2015 0745   CL 96 03/30/2015 0745   CO2 28 03/30/2015 0745   GLUCOSE 117* 03/30/2015 0745   BUN 42* 03/30/2015 0745   CREATININE 5.22* 03/30/2015 0745   CALCIUM 8.0* 03/30/2015 0745   GFRNONAA  11* 03/30/2015 0745   GFRAA 13* 03/30/2015 0745   CBC    Component Value Date/Time   WBC 4.5 03/30/2015 0745   RBC 3.77* 03/30/2015 0745   RBC 3.18* 03/23/2015 0330   HGB 10.4* 03/30/2015 0745   HCT 33.0* 03/30/2015 0745   PLT 221 03/30/2015 0745   MCV 87.5 03/30/2015 0745   MCH 27.6 03/30/2015 0745   MCHC 31.5 03/30/2015 0745   RDW 14.6 03/30/2015 0745     Assessment/Plan: 1. Acute vs subacute kidney injury/CKD- last known Scr was 1.26 on 12/10/13 and Hgb was 12.7 at that time. Now with increase in Scr in setting of decompensated diastolic and systolic CHF with diuresis. Will warrant further work up and possible biopsy depending on 24 hour urine protein and serologies. No indication for dialysis at this time. 1. +Mspike on SPEP, unfortunately his 24 hour urine was initially discarded reordered 24 hour UPEP 2. Skeletal survey negative but he has [name] high likelihood of multiple myeoloma in light of AKI and anemia and +Mspike, may be cast nephropathy.  3. Await Heme/Onc eval and decide on bonemarrow biopsy +/- renal biopsy (would prefer to make diagnosis with less invasive/risky bone marrow biopsy if possible) 2. Acute decompensated diastolic and systolic congestive heart failure- management per cardiology. Not diuresing well over the last 24 hours.  1. increased dose of lasix to 120mg  bid 03/29/15 with slightly improved duresis will increase frequency to tid 2. EF 40-45% with diastolic dysfunction by ECHO 3. +M spike as above 4. HTN- improved 5. LVH- as above. Hold ACE/ARB for now 6. Anemia- possibly due to CKD (although his Scr was only 1.26 15 months ago and hgb 12.7 12/10/13). Will check SPEP/UPEP, guaiac stools and serologies and iron stores. 7. Secondary HPTH- presumably due to CKD.started calcitriol (iPTH 118).  8. Hypoalbuminemia- his albumin was 2.8 on 12/10/14 and now <2. R/o nephrotic syndrome (although spot urine with only 100mg  protein) also need to r/o myeloma or  amyloid  James [name]

## 2015-03-31 LAB — CBC
HCT: 23.7 % — ABNORMAL LOW (ref 39.0–52.0)
HEMOGLOBIN: 7.6 g/dL — AB (ref 13.0–17.0)
MCH: 28.3 pg (ref 26.0–34.0)
MCHC: 32.1 g/dL (ref 30.0–36.0)
MCV: 88.1 fL (ref 78.0–100.0)
Platelets: 275 10*3/uL (ref 150–400)
RBC: 2.69 MIL/uL — AB (ref 4.22–5.81)
RDW: 14.5 % (ref 11.5–15.5)
WBC: 6 10*3/uL (ref 4.0–10.5)

## 2015-03-31 LAB — RENAL FUNCTION PANEL
Albumin: 1.8 g/dL — ABNORMAL LOW (ref 3.5–5.2)
Anion gap: 9 (ref 5–15)
BUN: 45 mg/dL — AB (ref 6–23)
CHLORIDE: 95 mmol/L — AB (ref 96–112)
CO2: 29 mmol/L (ref 19–32)
CREATININE: 5.32 mg/dL — AB (ref 0.50–1.35)
Calcium: 7.9 mg/dL — ABNORMAL LOW (ref 8.4–10.5)
GFR calc Af Amer: 13 mL/min — ABNORMAL LOW (ref >90)
GFR calc non Af Amer: 11 mL/min — ABNORMAL LOW (ref >90)
Glucose, Bld: 159 mg/dL — ABNORMAL HIGH (ref 70–99)
Phosphorus: 6.5 mg/dL — ABNORMAL HIGH (ref 2.3–4.6)
Potassium: 4.9 mmol/L (ref 3.5–5.1)
Sodium: 133 mmol/L — ABNORMAL LOW (ref 135–145)

## 2015-03-31 LAB — ANTI-DNA ANTIBODY, DOUBLE-STRANDED

## 2015-03-31 LAB — GLUCOSE, CAPILLARY
GLUCOSE-CAPILLARY: 119 mg/dL — AB (ref 70–99)
GLUCOSE-CAPILLARY: 192 mg/dL — AB (ref 70–99)
Glucose-Capillary: 163 mg/dL — ABNORMAL HIGH (ref 70–99)
Glucose-Capillary: 135 mg/dL — ABNORMAL HIGH (ref 70–99)

## 2015-03-31 MED ORDER — ISOSORBIDE MONONITRATE ER 60 MG PO TB24
60.0000 mg | ORAL_TABLET | Freq: Every day | ORAL | Status: DC
  Administered 2015-04-01 – 2015-04-12 (×10): 60 mg via ORAL
  Filled 2015-03-31 (×12): qty 1

## 2015-03-31 MED ORDER — AMLODIPINE BESYLATE 5 MG PO TABS
5.0000 mg | ORAL_TABLET | Freq: Every day | ORAL | Status: DC
  Administered 2015-03-31 – 2015-04-03 (×4): 5 mg via ORAL
  Filled 2015-03-31 (×4): qty 1

## 2015-03-31 MED ORDER — HEPARIN SODIUM (PORCINE) 5000 UNIT/ML IJ SOLN
5000.0000 [IU] | Freq: Three times a day (TID) | INTRAMUSCULAR | Status: DC
  Administered 2015-04-03 – 2015-04-04 (×4): 5000 [IU] via SUBCUTANEOUS
  Filled 2015-03-31 (×7): qty 1

## 2015-03-31 NOTE — Progress Notes (Signed)
[facility] PROGRESS NOTE  James [name] ZOX:[id number] DOB: [birth year] DOA: 03/22/2015 PCP: No primary care provider on file.  Brief Summary  59yo who presented with scrotal swelling and generalized edema. The patient was found to have elevated creatinine and evidence of heart failure.   Cardiology was consulted and started IV lasix.  Records demonstrated he had sharp rise in creatinine over the last few months.  Nephrology was consulted.  On his SPEP he had an M spike. UPEP is completing urine collection today. It is possible that his acute kidney injury is secondary to multiple myeloma as opposed to hypertension and diabetes. Nephrology recommended pursuing bone marrow biopsy instead of kidney biopsy showed his confirmatory test such as IFE,/lambda light chain, beta-2 microglobulin return positive.  Bone survery was negative.  Discordant protein on urinalysis compared to total protein collected from 24-hour urine collection suspicious for multiple myeloma.    Assessment/Plan  Acute hypoxic respiratory failure secondary to acute on chronic systolic and diastolic congestive heart failure, on room air, swelling improving -   Weight stable -   -2.3L over last 24 hours and has not voided so far today -   2-D echo with ejection fraction 45-50% with LV diastolic dysfunction -   TSH within normal limits -   Appreciate cardiology assistance -   Lasix per cardiology   Acute kidney injury, creatinine was 1.2 in January 2015 but his creatinine was 3.5 in February 2016.  May be due to DM and HTN, but concern for MM.  Creatinine trending up -   Appreciate Nephrology assistance -   Avoid ACE/ARB -   Urine protein to creatinine 7.51, 24-hour urine protein 5712 but UA with low protein (albumin).  This is consistent with MM. -   IFE pending  Probable multiple myeloma with elevated M spike on SPEP -  Follow-up UPEP -  Beta-2 microglobulin 8.3, cryoglobulin pending -  IgG, IgA, IgM ordered -   Kappa/lambda ratio mildly elevated 3.83 (kappa 114, lambda 30) -  Bone survey negative -  Bone marrow biopsy:  Morphology, flow cytometry, cytogenetics, and myeloma panel -  Case discussed with Dr. Arbutus Ped, Oncology  Diabetes mellitus type 2 with renal complications, CBGs at goal -   Continue Levemir 12 units with sliding scale insulin  Hypertension, blood pressure still elevated -   Continue Metoprolol to 100 mg twice a day -   Continue hydralazine to 100 mg TID -   Increase imdur -   Add norvasc -   ACEI held due to AKI  Iron deficiency anemia and probable multiple myeloma -  Started iron supplementation -  Avoid epo for now while working up MM -  GI follow up -  Folate and b12 wnl  Secondary hyperparathyroidism -  Started on calcitriol  Diet:   2 g sodium Access:   PIV IVF:   off Proph:   heparin  Code Status:  full Family Communication:  Patient alone Disposition Plan:  Pending further diuresis and work up for MM   Consultants:   cardiology    nephrology   IR for bone marrow biopsy  Oncology Dr. Arbutus Ped by phone  Procedures:   echo  Antibiotics:   none   HPI/Subjective:  Patient states his swelling has improved dramatically in last 24 hours. Denies shortness of breath.   Objective: Filed Vitals:   03/30/15 1456 03/30/15 1708 03/30/15 2118 03/31/15 0630  BP: 115/91 143/76 132/70 131/74  Pulse: 70 69 66 66  Temp: 98.4 F (  36.9 C)  98.4 F (36.9 C) 98.6 F (37 C)  TempSrc: Oral  Oral Oral  Resp: 16  16 16   Height:      Weight:    108.274 kg (238 lb 11.2 oz)  SpO2: 95%  98% 97%    Intake/Output Summary (Last 24 hours) at 03/31/15 0945 Last data filed at 03/31/15 0831  Gross per 24 hour  Intake   1324 ml  Output   3300 ml  Net  -1976 ml   Filed Weights   03/29/15 0540 03/30/15 0557 03/31/15 0630  Weight: 108.364 kg (238 lb 14.4 oz) 108.5 kg (239 lb 3.2 oz) 108.274 kg (238 lb 11.2 oz)    Exam:   General:   Obese male, no acute  distress, no longer on oxygen  HEENT:  NCAT, MMM  Cardiovascular:  RRR, nl S1, S2 no mrg, 2+ pulses, warm extremities  Respiratory:  CTAB, no increased WOB  MSK:   Normal tone and bulk, 1+ slow pitting bilateral LEE  GU: deferred today  Data Reviewed: Basic Metabolic Panel:  Recent Labs Lab 03/27/15 0538 03/28/15 0430 03/29/15 0559 03/30/15 0745 03/31/15 0353  NA 139 136 137 134* 133*  K 4.4 4.3 4.5 4.6 4.9  CL 101 98 97 96 95*  CO2 30 32 32 28 29  GLUCOSE 110* 142* 162* 117* 159*  BUN 33* 34* 38* 42* 45*  CREATININE 4.62* 4.72* 5.04* 5.22* 5.32*  CALCIUM 8.0* 7.9* 8.1* 8.0* 7.9*  PHOS 5.7* 5.9* 6.5* 5.9* 6.5*   Liver Function Tests:  Recent Labs Lab 03/27/15 0538 03/28/15 0430 03/29/15 0559 03/30/15 0745 03/31/15 0353  ALBUMIN 1.7* 1.6* 1.7* 1.8* 1.8*   No results for input(s): LIPASE, AMYLASE in the last 168 hours. No results for input(s): AMMONIA in the last 168 hours. CBC:  Recent Labs Lab 03/29/15 1106 03/30/15 0745 03/31/15 0353  WBC 6.1 4.5 6.0  HGB 7.5* 10.4* 7.6*  HCT 23.6* 33.0* 23.7*  MCV 88.4 87.5 88.1  PLT 278 221 275   Cardiac Enzymes:  Recent Labs Lab 03/26/15 1614  CKTOTAL 380*   BNP (last 3 results)  Recent Labs  03/22/15 1903  BNP 1043.8*    ProBNP (last 3 results) No results for input(s): PROBNP in the last 8760 hours.  CBG:  Recent Labs Lab 03/30/15 0623 03/30/15 1114 03/30/15 1638 03/30/15 2122 03/31/15 0636  GLUCAP 117* 154* 151* 174* 119*    No results found for this or any previous visit (from the past 240 hour(s)).   Studies: No results found.  Scheduled Meds: . calcitRIOL  0.25 mcg Oral Q M,W,F  . ferrous sulfate  325 mg Oral Q breakfast  . furosemide  120 mg Intravenous TID  . gabapentin  300 mg Oral QHS  . heparin  5,000 Units Subcutaneous 3 times per day  . hydrALAZINE  100 mg Oral TID  . hydrocerin   Topical BID  . insulin aspart  0-9 Units Subcutaneous TID WC  . insulin detemir  12  Units Subcutaneous QHS  . isosorbide mononitrate  30 mg Oral Daily  . metoprolol tartrate  100 mg Oral BID WC  . sodium chloride  3 mL Intravenous Q12H   Continuous Infusions:   Principal Problem:   CHF (congestive heart failure) Active Problems:   Peripheral edema   HTN (hypertension)   DM (diabetes mellitus)   Acute renal failure   Anemia   Hypokalemia   Congestive heart disease   Hypoxia   Acute on  chronic diastolic CHF (congestive heart failure), NYHA class 4   Multiple myeloma    Time spent: 30 min    James [name], [facility]  [facility] Pager [phone]. If 7PM-7AM, please contact night-coverage at [email or web address], password [facility] 03/31/2015, 9:45 AM  LOS: 9 days

## 2015-03-31 NOTE — Progress Notes (Signed)
Brief Summary  59yo who presented with scrotal swelling and generalized edema. The patient was found to have elevated creatinine and evidence of heart failure. Cardiology was consulted and started IV lasix. Records demonstrated he had sharp rise in creatinine over the last few months. Nephrology was consulted. On his SPEP he had an M spike. UPEP is completing urine collection today. It is possible that his acute kidney injury is secondary to multiple myeloma as opposed to hypertension and diabetes. Nephrology recommended pursuing bone marrow biopsy instead of kidney biopsy showed his confirmatory test such as IFE,/lambda light chain, beta-2 microglobulin return positive. Bone survery was negative. Discordant protein on urinalysis compared to total protein collected from 24-hour urine collection suspicious for multiple myeloma.   Subjective: No chest pain and no SOB, still with edema, no lightheadedness  Objective: Vital signs in last 24 hours: Temp:  [98.4 F (36.9 C)-98.6 F (37 C)] 98.6 F (37 C) (04/25 0630) Pulse Rate:  [66-70] 66 (04/25 0630) Resp:  [16] 16 (04/25 0630) BP: (115-144)/(70-91) 131/74 mmHg (04/25 0630) SpO2:  [95 %-98 %] 97 % (04/25 0630) Weight:  [238 lb 11.2 oz (108.274 kg)] 238 lb 11.2 oz (108.274 kg) (04/25 0630) Weight change: -8 oz (-0.227 kg) Last BM Date: 03/30/15 Intake/Output from previous day: -2336 ( since admit -9251) wt 238.11 down from 256.9 on admit 04/24 0701 - 04/25 0700 In: 964 [P.O.:840; IV Piggyback:124] Out: 3300 [Urine:3300] Intake/Output this shift:    PE: General:Pleasant affect, NAD Skin:Warm and dry, brisk capillary refill HEENT:normocephalic, sclera clear, mucus membranes moist Heart:S1S2 RRR without murmur, gallup, rub or click Lungs:clear without rales, rhonchi, or wheezes TYO:MAYO, non tender, + BS, do not palpate liver spleen or masses Ext:2-3+ lower ext edema to hips,  2+ radial pulses Neuro:alert and oriented X  3, MAE, follows commands, + facial symmetry Tele:  SR- now off tele   Lab Results:  Recent Labs  03/30/15 0745 03/31/15 0353  WBC 4.5 6.0  HGB 10.4* 7.6*  HCT 33.0* 23.7*  PLT 221 275   BMET  Recent Labs  03/30/15 0745 03/31/15 0353  NA 134* 133*  K 4.6 4.9  CL 96 95*  CO2 28 29  GLUCOSE 117* 159*  BUN 42* 45*  CREATININE 5.22* 5.32*  CALCIUM 8.0* 7.9*   No results for input(s): TROPONINI in the last 72 hours.  Invalid input(s): CK, MB  No results found for: CHOL, HDL, LDLCALC, LDLDIRECT, TRIG, CHOLHDL Lab Results  Component Value Date   HGBA1C 7.2* 03/22/2015     Lab Results  Component Value Date   TSH 1.254 03/23/2015    Hepatic Function Panel  Recent Labs  03/31/15 0353  ALBUMIN 1.8*   No results for input(s): CHOL in the last 72 hours. No results for input(s): PROTIME in the last 72 hours.     Studies/Results: No results found.  Medications: I have reviewed the patient's current medications. Scheduled Meds: . calcitRIOL  0.25 mcg Oral Q M,W,F  . ferrous sulfate  325 mg Oral Q breakfast  . furosemide  120 mg Intravenous TID  . gabapentin  300 mg Oral QHS  . heparin  5,000 Units Subcutaneous 3 times per day  . hydrALAZINE  100 mg Oral TID  . hydrocerin   Topical BID  . insulin aspart  0-9 Units Subcutaneous TID WC  . insulin detemir  12 Units Subcutaneous QHS  . isosorbide mononitrate  30 mg Oral Daily  . metoprolol tartrate  100  mg Oral BID WC  . sodium chloride  3 mL Intravenous Q12H   Continuous Infusions:  PRN Meds:.sodium chloride, acetaminophen, hydrALAZINE, ondansetron (ZOFRAN) IV, sodium chloride  Assessment/Plan: Acute on chronic systolic and diastolic congestive heart failure -He has been on IV lasix 120 mg BID now TID per nephrology, with improvement of diuresis now -2L last 24 hours and -9251 since admit. Wt now down 18.79 lbs     - 2-D echo with ejection fraction 27-25% with LV diastolic dysfunction -TSH within  normal limits  LVH- hold ACE and ARB with acute kidney injury  Acute kidney injury, creatinine currently 5.32 - Last known Scr was 1.26 on 12/10/13.Nephrology on board. Now with increase in Scr in setting of decompensated diastolic and systolic CHF with diuresis- on the 23rd Cr. 5.04-->5.22--> today 5.32. Will warrant further work up and possible biopsy depending on 24 hour urine protein and serologies. No indication for dialysis at this time. Per nephrology okay to continue IV diuresis. - ACE inhibitor has been discontinued due to AKI  Hypertension , blood pressure improved on increased BB. -Cont Metoprolol 75 mg BID and Hydralazine 73m TID. -Continue imdur -ACEI held due to AKI  DM- per IM   Iron deficiency anemia and probable multiple myeloma per IM Large drop in H/H overnight  - Started iron supplementation - Avoid epo for now while working up MM - GI follow up - Folate and b12 wnl   LOS: 9 days   Time spent with pt. :15 minutes. [facility]  Nurse Practitioner Certified Pager [phone] after 5pm and on weekends call [phone] 03/31/2015, 8:11 AM   Patient seen, examined. Available data reviewed. Agree with findings, assessment, and plan as outlined by LCecilie Kicks NP. The patient was independently interviewed and examined. His mother and brother are at the bedside. Exam reveals heart is regular rate and rhythm. Lung fields are clear. There remains fairly marked edema in the lower extremities up to the thighs. Scrotal edema is improving. The patient is diuresing on furosemide 120 mg intravenously 3 times daily. Creatinine remained stable in the range of 5 mg/dL. Plans noted for bone marrow biopsy because of concern regarding multiple myeloma. Will continue to follow from a distance as nephrology team is managing his diuretics.  MSherren Mocha M.D. 03/31/2015 11:53 AM

## 2015-03-31 NOTE — Progress Notes (Signed)
Admit: 03/22/2015 LOS: 9  76M with hx/o DM2 and HTN admitted with nephrotic proteinuria, AKI, positive M-Spike on SPEP and K:L sFLC 3.78.  Found to have LVH and dCHF  Subjective:  No new events Continues to have edema ON TID lasix, good UOP  Pt says has had HTN and DM for '2-3 weeks'. Heme Onc eval pending   04/24 0701 - 04/25 0700 In: 964 [P.O.:840; IV Piggyback:124] Out: 3300 [Urine:3300]  Filed Weights   03/29/15 0540 03/30/15 0557 03/31/15 0630  Weight: 108.364 kg (238 lb 14.4 oz) 108.5 kg (239 lb 3.2 oz) 108.274 kg (238 lb 11.2 oz)    Scheduled Meds: . amLODipine  5 mg Oral Daily  . calcitRIOL  0.25 mcg Oral Q M,W,F  . ferrous sulfate  325 mg Oral Q breakfast  . furosemide  120 mg Intravenous TID  . gabapentin  300 mg Oral QHS  . heparin  5,000 Units Subcutaneous 3 times per day  . [START ON 04/03/2015] heparin  5,000 Units Subcutaneous 3 times per day  . hydrALAZINE  100 mg Oral TID  . hydrocerin   Topical BID  . insulin aspart  0-9 Units Subcutaneous TID WC  . insulin detemir  12 Units Subcutaneous QHS  . [START ON 04/01/2015] isosorbide mononitrate  60 mg Oral Daily  . metoprolol tartrate  100 mg Oral BID WC  . sodium chloride  3 mL Intravenous Q12H   Continuous Infusions:  PRN Meds:.sodium chloride, acetaminophen, hydrALAZINE, ondansetron (ZOFRAN) IV, sodium chloride  Current Labs: reviewed    Physical Exam:  Blood pressure 124/68, pulse 70, temperature 98.2 F (36.8 C), temperature source Oral, resp. rate 18, height 5' 5"  (1.651 m), weight 108.274 kg (238 lb 11.2 oz), SpO2 94 %. NAD, sleeping, not cooperative with exam RRR CTAB 3+ LEE, pitting No rashes/lesions  A/P 1. AKI + Nephrotic Syndrome + M-spike on SPEP and sFLC K:L 3.78 1. Cast nephropathy, AL Amyloid (less likley with Kappa LC)? 2. Could be just progressive HTN and DM2; recent A1c 7.2% 1. No clear evidence of DR or neuropathy 2. Pt not a great historian 3. ANCA, ASO, negative. C3 and &C4 WNL.    4. Check HIV, HCV, HBV serologies, ANA, dsDNA 5. For BM Bx 4/27 6. Cont diuretics at current dosing 2. Probably Multiple Myeloma 3. HTN: stable, ARB held 4. DM2: unclear history  Pearson Grippe MD 03/31/2015, 4:10 PM   Recent Labs Lab 03/29/15 0559 03/30/15 0745 03/31/15 0353  NA 137 134* 133*  K 4.5 4.6 4.9  CL 97 96 95*  CO2 32 28 29  GLUCOSE 162* 117* 159*  BUN 38* 42* 45*  CREATININE 5.04* 5.22* 5.32*  CALCIUM 8.1* 8.0* 7.9*  PHOS 6.5* 5.9* 6.5*    Recent Labs Lab 03/29/15 1106 03/30/15 0745 03/31/15 0353  WBC 6.1 4.5 6.0  HGB 7.5* 10.4* 7.6*  HCT 23.6* 33.0* 23.7*  MCV 88.4 87.5 88.1  PLT 278 221 275

## 2015-03-31 NOTE — Progress Notes (Signed)
IR PA aware of request for bone marrow biopsy, will schedule for Wednesday 4/27  Tsosie Billing [facility] Interventional Radiology  03/31/15  10:57 AM

## 2015-04-01 ENCOUNTER — Other Ambulatory Visit: Payer: Self-pay | Admitting: Radiology

## 2015-04-01 ENCOUNTER — Ambulatory Visit (HOSPITAL_COMMUNITY): Payer: Medicaid Other

## 2015-04-01 ENCOUNTER — Encounter (HOSPITAL_COMMUNITY): Payer: Self-pay | Admitting: Radiology

## 2015-04-01 LAB — RENAL FUNCTION PANEL
ANION GAP: 11 (ref 5–15)
Albumin: 1.8 g/dL — ABNORMAL LOW (ref 3.5–5.2)
BUN: 51 mg/dL — ABNORMAL HIGH (ref 6–23)
CALCIUM: 8 mg/dL — AB (ref 8.4–10.5)
CO2: 29 mmol/L (ref 19–32)
Chloride: 94 mmol/L — ABNORMAL LOW (ref 96–112)
Creatinine, Ser: 5.67 mg/dL — ABNORMAL HIGH (ref 0.50–1.35)
GFR calc Af Amer: 12 mL/min — ABNORMAL LOW (ref >90)
GFR calc non Af Amer: 10 mL/min — ABNORMAL LOW (ref >90)
Glucose, Bld: 137 mg/dL — ABNORMAL HIGH (ref 70–99)
PHOSPHORUS: 7 mg/dL — AB (ref 2.3–4.6)
Potassium: 4.8 mmol/L (ref 3.5–5.1)
Sodium: 134 mmol/L — ABNORMAL LOW (ref 135–145)

## 2015-04-01 LAB — UIFE/LIGHT CHAINS/TP QN, 24-HR UR
ALBUMIN, U: DETECTED
ALBUMIN, U: DETECTED
ALPHA 1 UR: DETECTED — AB
ALPHA 2 UR: DETECTED — AB
ALPHA 2 UR: DETECTED — AB
Alpha 1, Urine: DETECTED — AB
Beta, Urine: DETECTED — AB
Beta, Urine: DETECTED — AB
GAMMA UR: DETECTED — AB
Gamma Globulin, Urine: DETECTED — AB
TIME-UPE24: 24 h
Total Protein, Urine-Ur/day: 5746 mg/d — ABNORMAL HIGH (ref <150)
Total Protein, Urine: 338 mg/dL — ABNORMAL HIGH (ref 5–25)
Total Protein, Urine: 254 mg/dL — ABNORMAL HIGH (ref 5–25)
Volume, Urine: 1700 mL

## 2015-04-01 LAB — GLUCOSE, CAPILLARY
Glucose-Capillary: 135 mg/dL — ABNORMAL HIGH (ref 70–99)
Glucose-Capillary: 115 mg/dL — ABNORMAL HIGH (ref 70–99)
Glucose-Capillary: 184 mg/dL — ABNORMAL HIGH (ref 70–99)
Glucose-Capillary: 187 mg/dL — ABNORMAL HIGH (ref 70–99)

## 2015-04-01 LAB — HEPATITIS B SURFACE ANTIGEN: Hepatitis B Surface Ag: NEGATIVE

## 2015-04-01 LAB — ANTINUCLEAR ANTIBODIES, IFA: ANA Ab, IFA: NEGATIVE

## 2015-04-01 LAB — IGG, IGA, IGM
IgA: 102 mg/dL (ref 90–386)
IgG (Immunoglobin G), Serum: 2678 mg/dL — ABNORMAL HIGH (ref 700–1600)
IgM, Serum: <6 mg/dL — ABNORMAL LOW (ref 20–172)

## 2015-04-01 LAB — HEPATITIS C ANTIBODY: HCV AB: NEGATIVE

## 2015-04-01 LAB — ANTI-DNA ANTIBODY, DOUBLE-STRANDED: ds DNA Ab: <1 IU/mL

## 2015-04-01 LAB — HIV ANTIBODY (ROUTINE TESTING W REFLEX): HIV Screen 4th Generation wRfx: NONREACTIVE

## 2015-04-01 MED ORDER — DEXTROSE 5 % IV SOLN
120.0000 mg | Freq: Two times a day (BID) | INTRAVENOUS | Status: DC
  Administered 2015-04-01 – 2015-04-02 (×2): 120 mg via INTRAVENOUS
  Filled 2015-04-01 (×3): qty 12

## 2015-04-01 NOTE — Progress Notes (Addendum)
Admit: 03/22/2015 LOS: 10  23M with hx/o DM2 and HTN admitted with nephrotic proteinuria, AKI, positive M-Spike on SPEP and K:L sFLC 3.78.  Found to have LVH and dCHF  Subjective:  BM Bx tomorrow Stable UOP SCR cont to trend up Today he is more awake and tells me he's had diabetes for at least 10 years and hypertension for longer than that To his knowledge he was never told kidney disease He has an uncle require dialysis but no other family history   04/25 0701 - 04/26 0700 In: 1180 [P.O.:1180] Out: 52 [Urine:1700]  Filed Weights   03/30/15 0557 03/31/15 0630 04/01/15 0525  Weight: 108.5 kg (239 lb 3.2 oz) 108.274 kg (238 lb 11.2 oz) 108.228 kg (238 lb 9.6 oz)    Scheduled Meds: . amLODipine  5 mg Oral Daily  . calcitRIOL  0.25 mcg Oral Q M,W,F  . ferrous sulfate  325 mg Oral Q breakfast  . furosemide  120 mg Intravenous TID  . gabapentin  300 mg Oral QHS  . [START ON 04/03/2015] heparin  5,000 Units Subcutaneous 3 times per day  . hydrALAZINE  100 mg Oral TID  . hydrocerin   Topical BID  . insulin aspart  0-9 Units Subcutaneous TID WC  . insulin detemir  12 Units Subcutaneous QHS  . isosorbide mononitrate  60 mg Oral Daily  . metoprolol tartrate  100 mg Oral BID WC  . sodium chloride  3 mL Intravenous Q12H   Continuous Infusions:  PRN Meds:.sodium chloride, acetaminophen, hydrALAZINE, ondansetron (ZOFRAN) IV, sodium chloride  Current Labs: reviewed    Physical Exam:  Blood pressure 115/60, pulse 63, temperature 98.8 F (37.1 C), temperature source Oral, resp. rate 18, height 5' 5"  (1.651 m), weight 108.228 kg (238 lb 9.6 oz), SpO2 96 %. NAD, sleeping, not cooperative with exam RRR CTAB 3+ LEE, pitting No rashes/lesions  A/P 1. AKI + Nephrotic Syndrome + M-spike on SPEP and sFLC K:L 3.78 1. ?Cast nephropathy, AL Amyloid (less likley with Kappa LC)? 2. Could be just progressive HTN and DM2; recent A1c 7.2% 1. No clear evidence of DR or neuropathy 3. ANCA, ASO,  HIV, GBM Ab, HCV, HBV negative. C3 and &C4 WNL.   4. ANA, dsDNA pending 5. For BM Bx 4/27, I worry that this test will not conclusively demonstrate what is renal pathology is and will discuss with IR the feasibility of doing a renal biopsy at the same time -- discussed with radiology and will move forward on renal biopsy 6. Reduce to BID lasix 2. Probable Multiple Myeloma 3. HTN: stable, ARB held 4. DM2: unclear history  Pearson Grippe MD 04/01/2015, 11:35 AM   Recent Labs Lab 03/30/15 0745 03/31/15 0353 04/01/15 0520  NA 134* 133* 134*  K 4.6 4.9 4.8  CL 96 95* 94*  CO2 28 29 29   GLUCOSE 117* 159* 137*  BUN 42* 45* 51*  CREATININE 5.22* 5.32* 5.67*  CALCIUM 8.0* 7.9* 8.0*  PHOS 5.9* 6.5* 7.0*    Recent Labs Lab 03/29/15 1106 03/30/15 0745 03/31/15 0353  WBC 6.1 4.5 6.0  HGB 7.5* 10.4* 7.6*  HCT 23.6* 33.0* 23.7*  MCV 88.4 87.5 88.1  PLT 278 221 275

## 2015-04-01 NOTE — H&P (Signed)
Chief Complaint: Chief Complaint  Patient presents with  . Groin Swelling   Referring Physician(s): [facility]  History of Present Illness: James [name] is a 59 y.o. male who presented with B/L LE edema and groin swelling. He was found to have acute on chronic CHF, AKI-Nephrotic syndrome, elevated M spike and concern for multiple myeloma. IR received request for bone marrow biopsy. He denies any chest pain, states his shortness of breath has improved some. He denies any active signs of bleeding or excessive bruising. The patient denies any history of sleep apnea or chronic oxygen use. He has previously tolerated sedation without complications.    Past Medical History  Diagnosis Date  . Hypertension   . Diabetes mellitus without complication     Past Surgical History  Procedure Laterality Date  . Colonoscopy      Allergies: Review of patient's allergies indicates no known allergies.  Medications: Prior to Admission medications   Medication Sig Start Date End Date Taking? Authorizing Provider  furosemide (LASIX) 20 MG tablet Take 20 mg by mouth daily.   Yes Historical Provider, MD  gabapentin (NEURONTIN) 800 MG tablet Take 400 mg by mouth 2 (two) times daily.   Yes Historical Provider, MD  insulin detemir (LEVEMIR) 100 UNIT/ML injection Inject 20 Units into the skin 2 (two) times daily.   Yes Historical Provider, MD  lisinopril (PRINIVIL,ZESTRIL) 20 MG tablet Take 20 mg by mouth daily.   Yes Historical Provider, MD  metoprolol tartrate (LOPRESSOR) 25 MG tablet Take 25 mg by mouth 2 (two) times daily.   Yes Historical Provider, MD     History reviewed. No pertinent family history.  History   Social History  . Marital Status: Single    Spouse Name: N/A  . Number of Children: N/A  . Years of Education: N/A   Social History Main Topics  . Smoking status: Never Smoker   . Smokeless tobacco: Not on file  . Alcohol Use: No  . Drug Use: No  . Sexual Activity: Not on file    Other Topics Concern  . None   Social History Narrative   Review of Systems: A 12 point ROS discussed and pertinent positives are indicated in the HPI above.  All other systems are negative.  Review of Systems  Vital Signs: BP 115/60 mmHg  Pulse 63  Temp(Src) 98.8 F (37.1 C) (Oral)  Resp 18  Ht 5' 5" (1.651 m)  Wt 238 lb 9.6 oz (108.228 kg)  BMI 39.71 kg/m2  SpO2 96%  Physical Exam  Constitutional: He is oriented to person, place, and time. No distress.  HENT:  Head: Normocephalic and atraumatic.  Neck: No tracheal deviation present.  Cardiovascular: Normal rate and regular rhythm.  Exam reveals no friction rub.   No murmur heard. Pulmonary/Chest: Effort normal.  Diminished throughout  Abdominal: Soft. Bowel sounds are normal. He exhibits no distension. There is no tenderness.  Musculoskeletal: He exhibits edema.  Neurological: He is alert and oriented to person, place, and time.  Skin: He is not diaphoretic.    Mallampati Score:  MD Evaluation Airway: WNL Heart: WNL Abdomen: WNL Chest/ Lungs: WNL ASA  Classification: 2 Mallampati/Airway Score: Two  Imaging: Dg Chest 2 View  03/22/2015   CLINICAL DATA:  Hypoxia.  Hypertension and diabetes.  EXAM: CHEST  2 VIEW  COMPARISON:  None.  FINDINGS: There is mild cardiomegaly and vascular pedicle widening. Diffuse interstitial opacity with Kerley lines. Small bilateral pleural effusions with atelectasis.  IMPRESSION: CHF.  Electronically Signed   By: Monte Fantasia M.D.   On: 03/22/2015 22:51   US Renal  03/22/2015   CLINICAL DATA:  Renal failure.  Abdominal swelling.  Leg swelling.  EXAM: RENAL/URINARY TRACT ULTRASOUND COMPLETE  COMPARISON:  None.  FINDINGS: Right Kidney:  Length: 13.5 cm. Echogenicity within normal limits. No mass or hydronephrosis visualized.  Left Kidney:  Length: 12.1 cm. Echogenicity within normal limits. There is a 1.4 cm cyst in the interpolar left kidney. No solid mass or hydronephrosis  visualized.  Bladder:  Appears normal for degree of bladder distention.  Incidental:  Bilateral pleural effusions noted.  IMPRESSION: 1. Mild asymmetry in renal size, with right kidney mildly enlarged measuring 13.5 cm. No hydronephrosis or localizing abnormality. This may be related to mild renal hypertrophy. 2. Simple cyst in the interpolar left kidney, left kidney is otherwise normal. 3. Bilateral pleural effusions, incidentally noted.   Electronically Signed   By: Jeb Levering M.D.   On: 03/22/2015 22:45   Dg Bone Survey Met  03/28/2015   CLINICAL DATA:  Metastatic bone survey, multiple myeloma  EXAM: METASTATIC BONE SURVEY  COMPARISON:  03/22/2015  FINDINGS: Twenty-three images of skeletal structures submitted. Frontal view of the chest shows no destructive bony lesions. There is small left pleural effusion with left basilar atelectasis or infiltrate. Frontal and lateral view of the skull shows no lytic or blastic bone lesions. Frontal view bilateral shoulder and bilateral humerus shows no lytic or blastic lesions. Frontal and lateral view of the cervical spine shows the alignment preserved. No lytic or blastic lesions.  Frontal and lateral view of thoracic spine and lumbar spine shows alignment preserved. No lytic or blastic lesions. Frontal view bilateral hips and femur shows no lytic or blastic lesions. Frontal view bilateral tibia and fibula shows no lytic or blastic lesions. Bilateral forearm shows no lytic or blastic lesion.  IMPRESSION: No lytic or blastic lesions are identified. No evidence of metastatic disease. There is small left pleural effusion left basilar atelectasis or infiltrate.   Electronically Signed   By: Lahoma Crocker M.D.   On: 03/28/2015 14:27    Labs:  CBC:  Recent Labs  03/22/15 1903 03/29/15 1106 03/30/15 0745 03/31/15 0353  WBC 5.6 6.1 4.5 6.0  HGB 8.1* 7.5* 10.4* 7.6*  HCT 24.7* 23.6* 33.0* 23.7*  PLT 310 278 221 275    COAGS: No results for input(s): INR,  APTT in the last 8760 hours.  BMP:  Recent Labs  03/29/15 0559 03/30/15 0745 03/31/15 0353 04/01/15 0520  NA 137 134* 133* 134*  K 4.5 4.6 4.9 4.8  CL 97 96 95* 94*  CO2 32 _0 GLUCOSE 162* 117* 159* 137*  BUN 38* 42* 45* 51*  CALCIUM 8.1* 8.0* 7.9* 8.0*  CREATININE 5.04* 5.22* 5.32* 5.67*  GFRNONAA 12* 11* 11* 10*  GFRAA 13* 13* 13* 12*    LIVER FUNCTION TESTS:  Recent Labs  03/23/15 0330  03/29/15 0559 03/30/15 0745 03/31/15 0353 04/01/15 0520  BILITOT 0.6  --   --   --   --   --   AST 29  --   --   --   --   --   ALT 23  --   --   --   --   --   ALKPHOS 159*  --   --   --   --   --   PROT 7.1  --   --   --   --   --  ALBUMIN 1.7*  < > 1.7* 1.8* 1.8* 1.8*  < > = values in this interval not displayed.  Assessment and Plan: Acute on chronic CHF AKI, Nephrotic syndrome M spike on SPEP, concern for multiple myeloma Scheduled for image guided bone marrow biopsy 4/27, request for renal biopsy as well- will discuss with Dr. Annamaria Boots and possibly perform both on 4/27 HTN DM Will be npo after midnight for procedure 4/27, check am labs Risks and Benefits discussed with the patient including, but not limited to bleeding, infection, damage to adjacent structures or low yield requiring additional tests. All of the patient's questions were answered, patient is agreeable to proceed. Consent signed and in chart.   Thank you for this interesting consult.  I greatly enjoyed meeting Destyn Schuyler and look forward to participating in their care.  SignedHedy Jacob 04/01/2015, 11:03 AM   I spent a total of 20 Minutes in face to face in clinical consultation, greater than 50% of which was counseling/coordinating care for M spike on SPEP, concern for multiple myeloma.

## 2015-04-01 NOTE — Progress Notes (Addendum)
[facility] PROGRESS NOTE  James [name] NGE:[id number] DOB: October 29, 1956 DOA: 03/22/2015 PCP: No primary care provider on file.  Brief Summary  59yo who presented with scrotal swelling and generalized edema. The patient was found to have elevated creatinine and evidence of heart failure.   Cardiology was consulted and started IV lasix.  Records demonstrated he had sharp rise in creatinine over the last few months.  Nephrology was consulted.  On his SPEP he had an M spike. UPEP is completing urine collection today. It is possible that his acute kidney injury is secondary to multiple myeloma as opposed to hypertension and diabetes. Nephrology recommended pursuing bone marrow biopsy instead of kidney biopsy showed his confirmatory test such as IFE,/lambda light chain, beta-2 microglobulin return positive.  Bone survery was negative.  Discordant protein on urinalysis compared to total protein collected from 24-hour urine collection suspicious for multiple myeloma.  Bone marrow and kidney biopsy on Wed.  Assessment/Plan  Acute hypoxic respiratory failure secondary to acute on chronic systolic and diastolic congestive heart failure, on room air, swelling improving -   Weight stable, but swelling going down -   -511m over last 24 hours -   2-D echo with ejection fraction 440-10%with LV diastolic dysfunction -   TSH within normal limits -   Appreciate cardiology assistance -   Lasix per nephrology   Acute kidney injury, creatinine was 1.2 in January 2015 but his creatinine was 3.5 in February 2016.  May be due to DM and HTN, but concern for MM.  Creatinine trending up -   Appreciate Nephrology assistance -   Avoid ACE/ARB -   Urine protein to creatinine 7.51, 24-hour urine protein 5712 but UA with low protein (albumin).  This is consistent with MM. -   IFE pending -  Renal biopsy tomorrow  Probable multiple myeloma with elevated M spike on SPEP -  Follow-up UPEP -  Beta-2 microglobulin 8.3,  cryoglobulin pending -  IgG 2678, IgA 102, IgM <6 -  Kappa/lambda ratio mildly elevated 3.83 (kappa 114, lambda 30) -  Bone survey negative -  Bone marrow biopsy:  Morphology, flow cytometry, cytogenetics, and myeloma panel -  Case discussed with Dr. MJulien Nordmann Oncology -  IR for bone marrow biopsy in AM  Diabetes mellitus type 2 with renal complications, CBGs at goal -   Continue Levemir 12 units with sliding scale insulin  Hypertension, blood pressure well controlled -   Continue Metoprolol to 100 mg twice a day -   Continue hydralazine to 100 mg TID -   Continue Imdur -   Added norvasc on 4/25 -   ACEI held due to AKI  Iron deficiency anemia and probable multiple myeloma -  Started iron supplementation -  Avoid epo for now while working up MM -  GI follow up -  Folate and b12 wnl  Secondary hyperparathyroidism -  Started on calcitriol  Diet:   2 g sodium Access:   PIV IVF:   off Proph:   heparin  Code Status:  full Family Communication:  Patient alone Disposition Plan:  Pending further diuresis and work up for MM.  Bone marrow and kidney biopsy tomorrow   Consultants:   cardiology    nephrology   IR for bone marrow biopsy  Oncology Dr. MJulien Nordmannby phone  Procedures:   echo  Antibiotics:   none   HPI/Subjective:  Patient states his swelling in legs and scrotum continues to improve.  He can see his ankles.  [facility] PROGRESS NOTE  James [name] NGE:[id number] DOB: October 29, 1956 DOA: 03/22/2015 PCP: No primary care provider on file.  Brief Summary  59yo who presented with scrotal swelling and generalized edema. The patient was found to have elevated creatinine and evidence of heart failure.   Cardiology was consulted and started IV lasix.  Records demonstrated he had sharp rise in creatinine over the last few months.  Nephrology was consulted.  On his SPEP he had an M spike. UPEP is completing urine collection today. It is possible that his acute kidney injury is secondary to multiple myeloma as opposed to hypertension and diabetes. Nephrology recommended pursuing bone marrow biopsy instead of kidney biopsy showed his confirmatory test such as IFE,/lambda light chain, beta-2 microglobulin return positive.  Bone survery was negative.  Discordant protein on urinalysis compared to total protein collected from 24-hour urine collection suspicious for multiple myeloma.  Bone marrow and kidney biopsy on Wed.  Assessment/Plan  Acute hypoxic respiratory failure secondary to acute on chronic systolic and diastolic congestive heart failure, on room air, swelling improving -   Weight stable, but swelling going down -   -511m over last 24 hours -   2-D echo with ejection fraction 440-10%with LV diastolic dysfunction -   TSH within normal limits -   Appreciate cardiology assistance -   Lasix per nephrology   Acute kidney injury, creatinine was 1.2 in January 2015 but his creatinine was 3.5 in February 2016.  May be due to DM and HTN, but concern for MM.  Creatinine trending up -   Appreciate Nephrology assistance -   Avoid ACE/ARB -   Urine protein to creatinine 7.51, 24-hour urine protein 5712 but UA with low protein (albumin).  This is consistent with MM. -   IFE pending -  Renal biopsy tomorrow  Probable multiple myeloma with elevated M spike on SPEP -  Follow-up UPEP -  Beta-2 microglobulin 8.3,  cryoglobulin pending -  IgG 2678, IgA 102, IgM <6 -  Kappa/lambda ratio mildly elevated 3.83 (kappa 114, lambda 30) -  Bone survey negative -  Bone marrow biopsy:  Morphology, flow cytometry, cytogenetics, and myeloma panel -  Case discussed with Dr. MJulien Nordmann Oncology -  IR for bone marrow biopsy in AM  Diabetes mellitus type 2 with renal complications, CBGs at goal -   Continue Levemir 12 units with sliding scale insulin  Hypertension, blood pressure well controlled -   Continue Metoprolol to 100 mg twice a day -   Continue hydralazine to 100 mg TID -   Continue Imdur -   Added norvasc on 4/25 -   ACEI held due to AKI  Iron deficiency anemia and probable multiple myeloma -  Started iron supplementation -  Avoid epo for now while working up MM -  GI follow up -  Folate and b12 wnl  Secondary hyperparathyroidism -  Started on calcitriol  Diet:   2 g sodium Access:   PIV IVF:   off Proph:   heparin  Code Status:  full Family Communication:  Patient alone Disposition Plan:  Pending further diuresis and work up for MM.  Bone marrow and kidney biopsy tomorrow   Consultants:   cardiology    nephrology   IR for bone marrow biopsy  Oncology Dr. MJulien Nordmannby phone  Procedures:   echo  Antibiotics:   none   HPI/Subjective:  Patient states his swelling in legs and scrotum continues to improve.  He can see his ankles.  sodium chloride  3 mL Intravenous Q12H   Continuous Infusions:   Principal Problem:   CHF (congestive heart failure) Active Problems:   Peripheral edema   HTN (hypertension)   DM (diabetes mellitus)   Acute renal failure   Anemia   Hypokalemia   Congestive heart disease   Hypoxia   Acute on chronic diastolic CHF (congestive heart failure), NYHA class 4   Multiple myeloma    Time spent: 30 min    Jewelz Ricklefs  [facility] Pager [phone]. If 7PM-7AM, please contact night-coverage at [email or web address], password [facility] 04/01/2015, 1:17 PM  LOS: 10 days

## 2015-04-02 ENCOUNTER — Inpatient Hospital Stay (HOSPITAL_COMMUNITY): Payer: Medicaid Other

## 2015-04-02 LAB — RENAL FUNCTION PANEL
Albumin: 1.9 g/dL — ABNORMAL LOW (ref 3.5–5.2)
Anion gap: 8 (ref 5–15)
BUN: 54 mg/dL — AB (ref 6–23)
CO2: 31 mmol/L (ref 19–32)
Calcium: 7.9 mg/dL — ABNORMAL LOW (ref 8.4–10.5)
Chloride: 92 mmol/L — ABNORMAL LOW (ref 96–112)
Creatinine, Ser: 6.04 mg/dL — ABNORMAL HIGH (ref 0.50–1.35)
GFR calc Af Amer: 11 mL/min — ABNORMAL LOW (ref >90)
GFR calc non Af Amer: 9 mL/min — ABNORMAL LOW (ref >90)
GLUCOSE: 144 mg/dL — AB (ref 70–99)
PHOSPHORUS: 7.1 mg/dL — AB (ref 2.3–4.6)
Potassium: 4.7 mmol/L (ref 3.5–5.1)
Sodium: 131 mmol/L — ABNORMAL LOW (ref 135–145)

## 2015-04-02 LAB — CBC WITH DIFFERENTIAL/PLATELET
BASOS ABS: 0.1 10*3/uL (ref 0.0–0.1)
BASOS PCT: 1 % (ref 0–1)
EOS PCT: 5 % (ref 0–5)
Eosinophils Absolute: 0.3 10*3/uL (ref 0.0–0.7)
HEMATOCRIT: 23.4 % — AB (ref 39.0–52.0)
HEMOGLOBIN: 7.4 g/dL — AB (ref 13.0–17.0)
Lymphocytes Relative: 29 % (ref 12–46)
Lymphs Abs: 1.6 10*3/uL (ref 0.7–4.0)
MCH: 27.8 pg (ref 26.0–34.0)
MCHC: 31.6 g/dL (ref 30.0–36.0)
MCV: 88 fL (ref 78.0–100.0)
MONO ABS: 0.7 10*3/uL (ref 0.1–1.0)
MONOS PCT: 13 % — AB (ref 3–12)
Neutro Abs: 2.9 10*3/uL (ref 1.7–7.7)
Neutrophils Relative %: 52 % (ref 43–77)
Platelets: 292 10*3/uL (ref 150–400)
RBC: 2.66 MIL/uL — ABNORMAL LOW (ref 4.22–5.81)
RDW: 14.9 % (ref 11.5–15.5)
WBC: 5.5 10*3/uL (ref 4.0–10.5)

## 2015-04-02 LAB — GLUCOSE, CAPILLARY
GLUCOSE-CAPILLARY: 97 mg/dL (ref 70–99)
GLUCOSE-CAPILLARY: 188 mg/dL — AB (ref 70–99)
GLUCOSE-CAPILLARY: 168 mg/dL — AB (ref 70–99)
Glucose-Capillary: 132 mg/dL — ABNORMAL HIGH (ref 70–99)

## 2015-04-02 LAB — PROTIME-INR
INR: 1.21 (ref 0.00–1.49)
Prothrombin Time: 15.5 seconds — ABNORMAL HIGH (ref 11.6–15.2)

## 2015-04-02 LAB — HEPATITIS B CORE ANTIBODY, IGM: Hep B C IgM: NONREACTIVE

## 2015-04-02 LAB — HEPATITIS A ANTIBODY, IGM: Hep A IgM: NONREACTIVE

## 2015-04-02 LAB — HEPATITIS PANEL, ACUTE
HCV Ab: NEGATIVE
Hep A IgM: NEGATIVE — AB
Hep B C IgM: NEGATIVE — AB
Hepatitis B Surface Ag: NEGATIVE — AB

## 2015-04-02 LAB — BONE MARROW EXAM

## 2015-04-02 MED ORDER — MIDAZOLAM HCL 2 MG/2ML IJ SOLN
INTRAMUSCULAR | Status: AC
  Filled 2015-04-02: qty 4

## 2015-04-02 MED ORDER — LIDOCAINE HCL (PF) 1 % IJ SOLN
INTRAMUSCULAR | Status: AC
  Filled 2015-04-02: qty 10

## 2015-04-02 MED ORDER — FENTANYL CITRATE (PF) 100 MCG/2ML IJ SOLN
INTRAMUSCULAR | Status: AC | PRN
  Administered 2015-04-02: 50 ug via INTRAVENOUS

## 2015-04-02 MED ORDER — MIDAZOLAM HCL 2 MG/2ML IJ SOLN
INTRAMUSCULAR | Status: AC | PRN
  Administered 2015-04-02: 1 mg via INTRAVENOUS

## 2015-04-02 MED ORDER — FENTANYL CITRATE (PF) 100 MCG/2ML IJ SOLN
INTRAMUSCULAR | Status: AC
  Filled 2015-04-02: qty 4

## 2015-04-02 MED ORDER — FUROSEMIDE 10 MG/ML IJ SOLN
120.0000 mg | Freq: Every day | INTRAVENOUS | Status: DC
  Administered 2015-04-03 – 2015-04-04 (×2): 120 mg via INTRAVENOUS
  Filled 2015-04-02 (×2): qty 12

## 2015-04-02 NOTE — Sedation Documentation (Signed)
10:18 am: bone marrow biopsy procedure complete. Patient will be immediately transferred to ultrasound for a kidney biopsy. Patient is resting comfortably, denies pain or discomfort at this time. Will continue to monitor. Will assist in getting patient transferred to [place]

## 2015-04-02 NOTE — Progress Notes (Signed)
Admit: 03/22/2015 LOS: 11  54M with hx/o DM2 and HTN admitted with nephrotic proteinuria, AKI, positive M-Spike on SPEP and K:L sFLC 3.78.  Found to have LVH and dCHF  Subjective:  BM Bx completed today Renal Bx completed today Stable UOP SCR cont to trend up Somonlent post Bx  04/26 0701 - 04/27 0700 In: 1420 [P.O.:1420] Out: 1800 [Urine:1800]  Filed Weights   03/31/15 0630 04/01/15 0525 04/02/15 0507  Weight: 108.274 kg (238 lb 11.2 oz) 108.228 kg (238 lb 9.6 oz) 108.183 kg (238 lb 8 oz)    Scheduled Meds: . amLODipine  5 mg Oral Daily  . calcitRIOL  0.25 mcg Oral Q M,W,F  . fentaNYL      . ferrous sulfate  325 mg Oral Q breakfast  . [START ON 04/03/2015] furosemide  120 mg Intravenous Daily  . gabapentin  300 mg Oral QHS  . [START ON 04/03/2015] heparin  5,000 Units Subcutaneous 3 times per day  . hydrALAZINE  100 mg Oral TID  . hydrocerin   Topical BID  . insulin aspart  0-9 Units Subcutaneous TID WC  . insulin detemir  12 Units Subcutaneous QHS  . isosorbide mononitrate  60 mg Oral Daily  . lidocaine (PF)      . metoprolol tartrate  100 mg Oral BID WC  . midazolam      . sodium chloride  3 mL Intravenous Q12H   Continuous Infusions:  PRN Meds:.sodium chloride, acetaminophen, hydrALAZINE, ondansetron (ZOFRAN) IV, sodium chloride  Current Labs: reviewed    Physical Exam:  Blood pressure 114/71, pulse 65, temperature 98.1 F (36.7 C), temperature source Oral, resp. rate 11, height 5' 5"  (1.651 m), weight 108.183 kg (238 lb 8 oz), SpO2 98 %. NAD, sleeping, not cooperative with exam RRR CTAB 3+ LEE, pitting No rashes/lesions  A/P 1. AKI + Nephrotic Syndrome + M-spike on SPEP and sFLC K:L 3.78 1. ?Cast nephropathy, AL Amyloid (less likley with Kappa LC)? 2. Could be just progressive HTN and DM2; recent A1c 7.2% 1. No clear evidence of DR or neuropathy 3. ANCA, ASO, HIV, GBM Ab, HCV, HBV, ANA, dsDNA negative. C3 and &C4 WNL.   4. BM Bx 4/27, 5. Renal Bx  4/27 6. Reduce to qDay lasix 7. No HD needs now, but worrisome for possibility in near future 8. No anticoagulation for VTE Px for at least 48h post Bx 2. Probable Multiple Myeloma 3. HTN: stable, ARB held 4. DM2: unclear history  Pearson Grippe MD 04/02/2015, 11:32 AM   Recent Labs Lab 03/31/15 0353 04/01/15 0520 04/02/15 0520  NA 133* 134* 131*  K 4.9 4.8 4.7  CL 95* 94* 92*  CO2 29 29 31   GLUCOSE 159* 137* 144*  BUN 45* 51* 54*  CREATININE 5.32* 5.67* 6.04*  CALCIUM 7.9* 8.0* 7.9*  PHOS 6.5* 7.0* 7.1*    Recent Labs Lab 03/30/15 0745 03/31/15 0353 04/02/15 0520  WBC 4.5 6.0 5.5  NEUTROABS  --   --  2.9  HGB 10.4* 7.6* 7.4*  HCT 33.0* 23.7* 23.4*  MCV 87.5 88.1 88.0  PLT 221 275 292

## 2015-04-02 NOTE — Progress Notes (Signed)
[facility] PROGRESS NOTE  James [name] UJW:[id number] DOB: March 08, 1956 DOA: 03/22/2015 PCP: No primary care provider on file.  Brief Summary  59yo who presented with scrotal swelling and generalized edema. The patient was found to have elevated creatinine and evidence of heart failure.   Cardiology was consulted and started IV lasix.  Records demonstrated he had sharp rise in creatinine over the last few months.  Nephrology was consulted.  On his SPEP he had an M spike. UPEP is completing urine collection today. It is possible that his acute kidney injury is secondary to multiple myeloma as opposed to hypertension and diabetes. Nephrology recommended pursuing bone marrow biopsy instead of kidney biopsy showed his confirmatory test such as IFE,/lambda light chain, beta-2 microglobulin return positive.  Bone survery was negative.  Discordant protein on urinalysis compared to total protein collected from 24-hour urine collection suspicious for multiple myeloma.  Bone marrow and kidney biopsy on Wed.  Assessment/Plan  Acute hypoxic respiratory failure secondary to acute on chronic systolic and diastolic congestive heart failure, on room air, swelling improving -    2-D echo with ejection fraction 45-50% with LV diastolic dysfunction -   TSH within normal limits -   Appreciate cardiology assistance, cardiology following prn, last note on 4/25 -   Lasix per nephrology   Acute kidney injury, creatinine was 1.2 in January 2015 but his creatinine was 3.5 in February 2016.  May be due to DM and HTN, but concern for MM.  Creatinine trending up -   Appreciate Nephrology assistance -   Avoid ACE/ARB -   Urine protein to creatinine 7.51, 24-hour urine protein 5712 but UA with low protein (albumin).  This is consistent with MM. -   IFE pending -  Renal biopsy 4/27 -may need dialysis, management per  nephrology  Probable multiple myeloma with elevated M spike on SPEP -  Follow-up UPEP -  Beta-2  microglobulin 8.3, cryoglobulin pending -  IgG 2678, IgA 102, IgM <6 -  Kappa/lambda ratio mildly elevated 3.83 (kappa 114, lambda 30) -  Bone survey negative -  Bone marrow biopsy:  Morphology, flow cytometry, cytogenetics, and myeloma panel -  Case discussed with Dr. Arbutus Ped, Oncology -  IR for bone marrow biopsy on 4/27  Diabetes mellitus type 2 with renal complications, CBGs at goal -   Continue Levemir 12 units with sliding scale insulin  Hypertension, blood pressure well controlled -   Continue Metoprolol to 100 mg twice a day -   Continue hydralazine to 100 mg TID -   Continue Imdur -   Added norvasc on 4/25 -   ACEI held due to AKI  Iron deficiency anemia and probable multiple myeloma -  Started iron supplementation -  Avoid epo for now while working up MM -  GI follow up -  Folate and b12 wnl  Secondary hyperparathyroidism -  Started on calcitriol  Diet:   2 g sodium Access:   PIV IVF:   off Proph:   Heparin held for 48hrs post biopsy. Start scds  Code Status:  full Family Communication:  Patient alone Disposition Plan:  Pending further diuresis and work up for MM.  Bone marrow and kidney biopsy    Consultants:   cardiology    nephrology   IR for bone marrow biopsy/renal biopsy  Oncology Dr. Arbutus Ped by phone  Procedures:   echo  Bone marrow biopsy/renal biopsy 4/27  Antibiotics:   none   HPI/Subjective:  Reported less swollen, good appetite,  Denies shortness of breath.  Denies pain  Objective: Filed Vitals:   04/02/15 1042 04/02/15 1046 04/02/15 1253 04/02/15 1400  BP: 111/62 114/71 124/67 113/53  Pulse: 64 65 64 64  Temp:   97.9 F (36.6 C) 98.5 F (36.9 C)  TempSrc:   Oral Oral  Resp: 11 11  20   Height:      Weight:      SpO2: 97% 98% 96% 100%    Intake/Output Summary (Last 24 hours) at 04/02/15 1902 Last data filed at 04/02/15 1800  Gross per 24 hour  Intake   1543 ml  Output   2300 ml  Net   -757 ml   Filed Weights    03/31/15 0630 04/01/15 0525 04/02/15 0507  Weight: 108.274 kg (238 lb 11.2 oz) 108.228 kg (238 lb 9.6 oz) 108.183 kg (238 lb 8 oz)    Exam:   General:   Obese male, no acute distress, sitting at edge of bed  HEENT:  NCAT, MMM  Cardiovascular:  RRR, nl S1, S2 no mrg, 2+ pulses, warm extremities  Respiratory:  CTAB, no increased WOB  MSK:   Normal tone and bulk, 1+ slow pitting bilateral LEE, ankles are visible.  Hands and eyelids still a little swollen  GU: deferred today  Data Reviewed: Basic Metabolic Panel:  Recent Labs Lab 03/29/15 0559 03/30/15 0745 03/31/15 0353 04/01/15 0520 04/02/15 0520  NA 137 134* 133* 134* 131*  K 4.5 4.6 4.9 4.8 4.7  CL 97 96 95* 94* 92*  CO2 32 28 29 29 31   GLUCOSE 162* 117* 159* 137* 144*  BUN 38* 42* 45* 51* 54*  CREATININE 5.04* 5.22* 5.32* 5.67* 6.04*  CALCIUM 8.1* 8.0* 7.9* 8.0* 7.9*  PHOS 6.5* 5.9* 6.5* 7.0* 7.1*   Liver Function Tests:  Recent Labs Lab 03/29/15 0559 03/30/15 0745 03/31/15 0353 04/01/15 0520 04/02/15 0520  ALBUMIN 1.7* 1.8* 1.8* 1.8* 1.9*   No results for input(s): LIPASE, AMYLASE in the last 168 hours. No results for input(s): AMMONIA in the last 168 hours. CBC:  Recent Labs Lab 03/29/15 1106 03/30/15 0745 03/31/15 0353 04/02/15 0520  WBC 6.1 4.5 6.0 5.5  NEUTROABS  --   --   --  2.9  HGB 7.5* 10.4* 7.6* 7.4*  HCT 23.6* 33.0* 23.7* 23.4*  MCV 88.4 87.5 88.1 88.0  PLT 278 221 275 292   Cardiac Enzymes: No results for input(s): CKTOTAL, CKMB, CKMBINDEX, TROPONINI in the last 168 hours. BNP (last 3 results)  Recent Labs  03/22/15 1903  BNP 1043.8*    ProBNP (last 3 results) No results for input(s): PROBNP in the last 8760 hours.  CBG:  Recent Labs Lab 04/01/15 1632 04/01/15 2103 04/02/15 0622 04/02/15 1208 04/02/15 1658  GLUCAP 184* 187* 132* 97 188*    No results found for this or any previous visit (from the past 240 hour(s)).   Studies: US Biopsy  04/02/2015   CLINICAL  DATA:  Acute renal failure, suspect multiple myeloma  EXAM: ULTRASOUND GUIDED CORE BIOPSY OF LEFT RENAL CORTEX  MEDICATIONS: 1.0 mg IV Versed; 50 mcg IV Fentanyl  Total Moderate Sedation Time: 15  PROCEDURE: The procedure, risks, benefits, and alternatives were explained to the patient. Questions regarding the procedure were encouraged and answered. The patient understands and consents to the procedure.  The left flank was prepped with Betadine In a sterile fashion, and a sterile drape was applied covering the operative field. A sterile gown and sterile gloves were used for the  procedure. Local anesthesia was provided with 1% Lidocaine.  Previous imaging reviewed. Patient positioned prone. Preliminary ultrasound performed. The left kidney lower pole cortex was localized. Under sterile conditions and local anesthesia, a 16 gauge core biopsy needle was advanced to the left kidney lower pole cortex. 2 16 gauge core biopsies obtained. Samples placed in saline. These were intact and non fragmented. Postprocedure imaging demonstrates a small amount of surrounding lower pole hemorrhage from the biopsy. No hydronephrosis. Patient tolerated the biopsy well.  COMPLICATIONS: None.  FINDINGS: Imaging confirms needle placement to the left kidney lower pole cortex for core biopsy  IMPRESSION: Successful ultrasound left renal core biopsy   Electronically Signed   By: Judie Petit.  Shick M.D.   On: 04/02/2015 10:55   Ct Biopsy  04/02/2015   CLINICAL DATA:  MULTIPLE MYELOMA  EXAM: CT GUIDED RIGHT ILIAC BONE MARROW ASPIRATION AND CORE BIOPSY  Date:  4/27/20164/27/2016 10:43 am  Radiologist:  M. Ruel Favors, MD  Guidance:  CT  FLUOROSCOPY TIME:  None.  MEDICATIONS AND MEDICAL HISTORY: 1 mg Versed, 50 mcg fentanyl  ANESTHESIA/SEDATION: 15 minutes  CONTRAST:  None.  COMPLICATIONS: None  PROCEDURE: Informed consent was obtained from the patient following explanation of the procedure, risks, benefits and alternatives. The patient understands,  agrees and consents for the procedure. All questions were addressed. A time out was performed.  The patient was positioned prone and noncontrast localization CT was performed of the pelvis to demonstrate the iliac marrow spaces.  Maximal barrier sterile technique utilized including caps, mask, sterile gowns, sterile gloves, large sterile drape, hand hygiene, and betadine prep.  Under sterile conditions and local anesthesia, an 11 gauge coaxial bone biopsy needle was advanced into the right iliac marrow space. Needle position was confirmed with CT imaging. Initially, bone marrow aspiration was performed. Next, the 11 gauge outer cannula was utilized to obtain a right iliac bone marrow core biopsy. Needle was removed. Hemostasis was obtained with compression. The patient tolerated the procedure well. Samples were prepared with the cytotechnologist. No immediate complications.  IMPRESSION: CT guided right iliac bone marrow aspiration and core biopsy.   Electronically Signed   By: Judie Petit.  Shick M.D.   On: 04/02/2015 10:53    Scheduled Meds: . amLODipine  5 mg Oral Daily  . calcitRIOL  0.25 mcg Oral Q M,W,F  . fentaNYL      . ferrous sulfate  325 mg Oral Q breakfast  . [START ON 04/03/2015] furosemide  120 mg Intravenous Daily  . gabapentin  300 mg Oral QHS  . [START ON 04/03/2015] heparin  5,000 Units Subcutaneous 3 times per day  . hydrALAZINE  100 mg Oral TID  . hydrocerin   Topical BID  . insulin aspart  0-9 Units Subcutaneous TID WC  . insulin detemir  12 Units Subcutaneous QHS  . isosorbide mononitrate  60 mg Oral Daily  . lidocaine (PF)      . metoprolol tartrate  100 mg Oral BID WC  . midazolam      . sodium chloride  3 mL Intravenous Q12H   Continuous Infusions:   Principal Problem:   CHF (congestive heart failure) Active Problems:   Peripheral edema   HTN (hypertension)   DM (diabetes mellitus)   Acute renal failure   Anemia   Hypokalemia   Congestive heart disease   Hypoxia   Acute  on chronic diastolic CHF (congestive heart failure), NYHA class 4   Multiple myeloma    Time spent: 35 min  Cadee Agro  [facility] Pager [phone]. If 7PM-7AM, please contact night-coverage at [email or web address], password [facility] 04/02/2015, 7:02 PM  LOS: 11 days

## 2015-04-02 NOTE — Procedures (Signed)
Successful RT ILIAC BM ASP AND CORE BX NO COMP STABLE PATH PENDING FULL REPORT IN PACS

## 2015-04-02 NOTE — Progress Notes (Signed)
Patient bedrest up at 1245, no bleeding, v/s stable, patient denies pain. Will continue to monitor patient.

## 2015-04-02 NOTE — Procedures (Signed)
Successful LT RENAL CORE BX NO COMP STABLE PATH PENDING FULL REPORT IN PACS

## 2015-04-02 NOTE — Progress Notes (Signed)
Patient came back to the floor after biopsy at 11:00 am, patient alert oriented, denies pain or shortness of breath. Will continue to monitor patient.

## 2015-04-02 NOTE — Sedation Documentation (Signed)
Patient arrived into U/S at 10:31 am. Patient awake, alert, denies pain or discomfort. Will continue to monitor. Roselyn Reef Pasqualina Colasurdo,RN

## 2015-04-03 LAB — GLUCOSE, CAPILLARY
GLUCOSE-CAPILLARY: 136 mg/dL — AB (ref 70–99)
Glucose-Capillary: 133 mg/dL — ABNORMAL HIGH (ref 70–99)
Glucose-Capillary: 136 mg/dL — ABNORMAL HIGH (ref 70–99)
Glucose-Capillary: 163 mg/dL — ABNORMAL HIGH (ref 70–99)

## 2015-04-03 LAB — RENAL FUNCTION PANEL
Albumin: 1.8 g/dL — ABNORMAL LOW (ref 3.5–5.2)
Anion gap: 8 (ref 5–15)
BUN: 55 mg/dL — AB (ref 6–23)
CHLORIDE: 92 mmol/L — AB (ref 96–112)
CO2: 33 mmol/L — AB (ref 19–32)
CREATININE: 6.44 mg/dL — AB (ref 0.50–1.35)
Calcium: 7.8 mg/dL — ABNORMAL LOW (ref 8.4–10.5)
GFR calc Af Amer: 10 mL/min — ABNORMAL LOW (ref >90)
GFR calc non Af Amer: 9 mL/min — ABNORMAL LOW (ref >90)
Glucose, Bld: 164 mg/dL — ABNORMAL HIGH (ref 70–99)
Phosphorus: 7.6 mg/dL — ABNORMAL HIGH (ref 2.3–4.6)
Potassium: 4.8 mmol/L (ref 3.5–5.1)
SODIUM: 133 mmol/L — AB (ref 135–145)

## 2015-04-03 LAB — CBC
HCT: 23 % — ABNORMAL LOW (ref 39.0–52.0)
Hemoglobin: 7.3 g/dL — ABNORMAL LOW (ref 13.0–17.0)
MCH: 28 pg (ref 26.0–34.0)
MCHC: 31.7 g/dL (ref 30.0–36.0)
MCV: 88.1 fL (ref 78.0–100.0)
PLATELETS: 309 10*3/uL (ref 150–400)
RBC: 2.61 MIL/uL — AB (ref 4.22–5.81)
RDW: 14.8 % (ref 11.5–15.5)
WBC: 6.1 10*3/uL (ref 4.0–10.5)

## 2015-04-03 MED ORDER — CALCIUM ACETATE (PHOS BINDER) 667 MG PO CAPS
1334.0000 mg | ORAL_CAPSULE | Freq: Three times a day (TID) | ORAL | Status: DC
  Administered 2015-04-03: 1334 mg via ORAL
  Filled 2015-04-03 (×2): qty 2

## 2015-04-03 NOTE — Progress Notes (Signed)
[facility] PROGRESS NOTE  James [name] WNU:[id number] DOB: [birth year] DOA: 03/22/2015 PCP: No primary care provider on file.  Brief Summary  59yo who presented with scrotal swelling and generalized edema. The patient was found to have elevated creatinine and evidence of heart failure.   Cardiology was consulted and started IV lasix.  Records demonstrated he had sharp rise in creatinine over the last few months.  Nephrology was consulted.  On his SPEP he had an M spike. UPEP is completing urine collection today. It is possible that his acute kidney injury is secondary to multiple myeloma as opposed to hypertension and diabetes. Nephrology recommended pursuing bone marrow biopsy instead of kidney biopsy showed his confirmatory test such as IFE,/lambda light chain, beta-2 microglobulin return positive.  Bone survery was negative.  Discordant protein on urinalysis compared to total protein collected from 24-hour urine collection suspicious for multiple myeloma.  Bone marrow and kidney biopsy on Wed.  Assessment/Plan  Acute hypoxic respiratory failure secondary to acute on chronic systolic and diastolic congestive heart failure, on room air, swelling improving -    2-D echo with ejection fraction 45-50% with LV diastolic dysfunction -   TSH within normal limits -   Appreciate cardiology assistance, cardiology following prn, last note on 4/25 -   Lasix per nephrology   Acute kidney injury, creatinine was 1.2 in January 2015 but his creatinine was 3.5 in February 2016.  May be due to DM and HTN, but concern for MM.  Creatinine trending up -   Appreciate Nephrology assistance -   Avoid ACE/ARB -   Urine protein to creatinine 7.51, 24-hour urine protein 5712 but UA with low protein (albumin).  This is consistent with MM. -   IFE pending -  Renal biopsy 4/27 -may need dialysis, management per  nephrology  Probable multiple myeloma with elevated M spike on SPEP -  Follow-up UPEP -  Beta-2  microglobulin 8.3, cryoglobulin pending -  IgG 2678, IgA 102, IgM <6 -  Kappa/lambda ratio mildly elevated 3.83 (kappa 114, lambda 30) -  Bone survey negative -  Bone marrow biopsy:  Morphology, flow cytometry, cytogenetics, and myeloma panel -  Case discussed with Dr. Arbutus Ped, Oncology -  IR for bone marrow biopsy on 4/27  Diabetes mellitus type 2 with renal complications, CBGs at goal -   Continue Levemir 12 units with sliding scale insulin  Hypertension, blood pressure well controlled -   Continue Metoprolol to 100 mg twice a day./hydralazine to 100 mg TID/ Imdur -    Added norvasc on 4/25, norvasc d/ced on 4/28, currently bp low normal, persistent lower extremity edema -   ACEI held due to AKI  Iron deficiency anemia and probable multiple myeloma -  Started iron supplementation -  Avoid epo for now while working up MM -  GI follow up -  Folate and b12 wnl  Secondary hyperparathyroidism -  Started on calcitriol  Diet:   2 g sodium Access:   PIV IVF:   off Proph:   Heparin held for 48hrs post biopsy. Start scds  Code Status:  full Family Communication:  Patient alone Disposition Plan:  Pending further diuresis and work up for MM.  Bone marrow and kidney biopsy    Consultants:   cardiology    nephrology   IR for bone marrow biopsy/renal biopsy  Oncology Dr. Arbutus Ped by phone  Procedures:   echo  Bone marrow biopsy/renal biopsy 4/27  Antibiotics:   none   HPI/Subjective:  Reported less  scrotal edema, persistent lower extremity edema, good appetite,  Good urine output, Denies shortness of breath.  Denies pain, cr worsening.  Objective: Filed Vitals:   04/02/15 2059 04/02/15 2231 04/03/15 0533 04/03/15 1054  BP: 107/52 100/70 139/72 108/52  Pulse: 65  67 70  Temp: 97.9 F (36.6 C)  98.2 F (36.8 C) 98.9 F (37.2 C)  TempSrc: Oral  Oral Oral  Resp: 18  18 18   Height:      Weight:   108.274 kg (238 lb 11.2 oz)   SpO2: 95%  96% 97%    Intake/Output  Summary (Last 24 hours) at 04/03/15 1130 Last data filed at 04/03/15 4098  Gross per 24 hour  Intake   1563 ml  Output   2100 ml  Net   -537 ml   Filed Weights   04/01/15 0525 04/02/15 0507 04/03/15 0533  Weight: 108.228 kg (238 lb 9.6 oz) 108.183 kg (238 lb 8 oz) 108.274 kg (238 lb 11.2 oz)    Exam:   General:   Obese male, no acute distress, sitting at edge of bed  HEENT:  NCAT, MMM  Cardiovascular:  RRR, nl S1, S2 no mrg, 2+ pulses, warm extremities  Respiratory:  CTAB, no increased WOB  MSK:   Normal tone and bulk, 1+ slow pitting bilateral LEE, ankles are visible. resolving Hands and eyelids edema   GU: deferred today  Data Reviewed: Basic Metabolic Panel:  Recent Labs Lab 03/30/15 0745 03/31/15 0353 04/01/15 0520 04/02/15 0520 04/03/15 0410  NA 134* 133* 134* 131* 133*  K 4.6 4.9 4.8 4.7 4.8  CL 96 95* 94* 92* 92*  CO2 28 29 29 31  33*  GLUCOSE 117* 159* 137* 144* 164*  BUN 42* 45* 51* 54* 55*  CREATININE 5.22* 5.32* 5.67* 6.04* 6.44*  CALCIUM 8.0* 7.9* 8.0* 7.9* 7.8*  PHOS 5.9* 6.5* 7.0* 7.1* 7.6*   Liver Function Tests:  Recent Labs Lab 03/30/15 0745 03/31/15 0353 04/01/15 0520 04/02/15 0520 04/03/15 0410  ALBUMIN 1.8* 1.8* 1.8* 1.9* 1.8*   No results for input(s): LIPASE, AMYLASE in the last 168 hours. No results for input(s): AMMONIA in the last 168 hours. CBC:  Recent Labs Lab 03/29/15 1106 03/30/15 0745 03/31/15 0353 04/02/15 0520 04/03/15 0850  WBC 6.1 4.5 6.0 5.5 6.1  NEUTROABS  --   --   --  2.9  --   HGB 7.5* 10.4* 7.6* 7.4* 7.3*  HCT 23.6* 33.0* 23.7* 23.4* 23.0*  MCV 88.4 87.5 88.1 88.0 88.1  PLT 278 221 275 292 309   Cardiac Enzymes: No results for input(s): CKTOTAL, CKMB, CKMBINDEX, TROPONINI in the last 168 hours. BNP (last 3 results)  Recent Labs  03/22/15 1903  BNP 1043.8*    ProBNP (last 3 results) No results for input(s): PROBNP in the last 8760 hours.  CBG:  Recent Labs Lab 04/02/15 1208  04/02/15 1658 04/02/15 2057 04/03/15 0537 04/03/15 1123  GLUCAP 97 188* 168* 136* 133*    No results found for this or any previous visit (from the past 240 hour(s)).   Studies: US Biopsy  04/02/2015   CLINICAL DATA:  Acute renal failure, suspect multiple myeloma  EXAM: ULTRASOUND GUIDED CORE BIOPSY OF LEFT RENAL CORTEX  MEDICATIONS: 1.0 mg IV Versed; 50 mcg IV Fentanyl  Total Moderate Sedation Time: 15  PROCEDURE: The procedure, risks, benefits, and alternatives were explained to the patient. Questions regarding the procedure were encouraged and answered. The patient understands and consents to the procedure.  The  left flank was prepped with Betadine In a sterile fashion, and a sterile drape was applied covering the operative field. A sterile gown and sterile gloves were used for the procedure. Local anesthesia was provided with 1% Lidocaine.  Previous imaging reviewed. Patient positioned prone. Preliminary ultrasound performed. The left kidney lower pole cortex was localized. Under sterile conditions and local anesthesia, a 16 gauge core biopsy needle was advanced to the left kidney lower pole cortex. 2 16 gauge core biopsies obtained. Samples placed in saline. These were intact and non fragmented. Postprocedure imaging demonstrates a small amount of surrounding lower pole hemorrhage from the biopsy. No hydronephrosis. Patient tolerated the biopsy well.  COMPLICATIONS: None.  FINDINGS: Imaging confirms needle placement to the left kidney lower pole cortex for core biopsy  IMPRESSION: Successful ultrasound left renal core biopsy   Electronically Signed   By: Judie Petit.  Shick M.D.   On: 04/02/2015 10:55   Ct Biopsy  04/02/2015   CLINICAL DATA:  MULTIPLE MYELOMA  EXAM: CT GUIDED RIGHT ILIAC BONE MARROW ASPIRATION AND CORE BIOPSY  Date:  4/27/20164/27/2016 10:43 am  Radiologist:  M. Ruel Favors, MD  Guidance:  CT  FLUOROSCOPY TIME:  None.  MEDICATIONS AND MEDICAL HISTORY: 1 mg Versed, 50 mcg fentanyl   ANESTHESIA/SEDATION: 15 minutes  CONTRAST:  None.  COMPLICATIONS: None  PROCEDURE: Informed consent was obtained from the patient following explanation of the procedure, risks, benefits and alternatives. The patient understands, agrees and consents for the procedure. All questions were addressed. A time out was performed.  The patient was positioned prone and noncontrast localization CT was performed of the pelvis to demonstrate the iliac marrow spaces.  Maximal barrier sterile technique utilized including caps, mask, sterile gowns, sterile gloves, large sterile drape, hand hygiene, and betadine prep.  Under sterile conditions and local anesthesia, an 11 gauge coaxial bone biopsy needle was advanced into the right iliac marrow space. Needle position was confirmed with CT imaging. Initially, bone marrow aspiration was performed. Next, the 11 gauge outer cannula was utilized to obtain a right iliac bone marrow core biopsy. Needle was removed. Hemostasis was obtained with compression. The patient tolerated the procedure well. Samples were prepared with the cytotechnologist. No immediate complications.  IMPRESSION: CT guided right iliac bone marrow aspiration and core biopsy.   Electronically Signed   By: Judie Petit.  Shick M.D.   On: 04/02/2015 10:53    Scheduled Meds: . calcitRIOL  0.25 mcg Oral Q M,W,F  . calcium acetate  1,334 mg Oral TID WC  . ferrous sulfate  325 mg Oral Q breakfast  . furosemide  120 mg Intravenous Daily  . gabapentin  300 mg Oral QHS  . heparin  5,000 Units Subcutaneous 3 times per day  . hydrALAZINE  100 mg Oral TID  . hydrocerin   Topical BID  . insulin aspart  0-9 Units Subcutaneous TID WC  . insulin detemir  12 Units Subcutaneous QHS  . isosorbide mononitrate  60 mg Oral Daily  . metoprolol tartrate  100 mg Oral BID WC  . sodium chloride  3 mL Intravenous Q12H   Continuous Infusions:   Principal Problem:   CHF (congestive heart failure) Active Problems:   Peripheral edema    HTN (hypertension)   DM (diabetes mellitus)   Acute renal failure   Anemia   Hypokalemia   Congestive heart disease   Hypoxia   Acute on chronic diastolic CHF (congestive heart failure), NYHA class 4   Multiple myeloma    Time  spent: 25 min    Delcie Ruppert  [facility] Pager [phone]. If 7PM-7AM, please contact night-coverage at [email or web address], password [facility] 04/03/2015, 11:30 AM  LOS: 12 days

## 2015-04-03 NOTE — Progress Notes (Signed)
Admit: 03/22/2015 LOS: 12  77M with hx/o DM2 and HTN admitted with nephrotic proteinuria, AKI, positive M-Spike on SPEP and K:L sFLC 3.78.  Found to have LVH and dCHF  Subjective:  No new events, still with good UOP No flank pain, hematuria No SOB or CP No N/V, good appetite  04/27 0701 - 04/28 0700 In: 963 [P.O.:960; I.V.:3] Out: 2100 [Urine:2100]  Filed Weights   04/01/15 0525 04/02/15 0507 04/03/15 0533  Weight: 108.228 kg (238 lb 9.6 oz) 108.183 kg (238 lb 8 oz) 108.274 kg (238 lb 11.2 oz)    Scheduled Meds: . amLODipine  5 mg Oral Daily  . calcitRIOL  0.25 mcg Oral Q M,W,F  . ferrous sulfate  325 mg Oral Q breakfast  . furosemide  120 mg Intravenous Daily  . gabapentin  300 mg Oral QHS  . heparin  5,000 Units Subcutaneous 3 times per day  . hydrALAZINE  100 mg Oral TID  . hydrocerin   Topical BID  . insulin aspart  0-9 Units Subcutaneous TID WC  . insulin detemir  12 Units Subcutaneous QHS  . isosorbide mononitrate  60 mg Oral Daily  . metoprolol tartrate  100 mg Oral BID WC  . sodium chloride  3 mL Intravenous Q12H   Continuous Infusions:  PRN Meds:.sodium chloride, acetaminophen, hydrALAZINE, ondansetron (ZOFRAN) IV, sodium chloride  Current Labs: reviewed    Physical Exam:  Blood pressure 108/52, pulse 70, temperature 98.9 F (37.2 C), temperature source Oral, resp. rate 18, height 5' 5"  (1.651 m), weight 108.274 kg (238 lb 11.2 oz), SpO2 97 %. NAD,  RRR, no rub CTAB 3+ LEE, pitting No rashes/lesions  A/P 1. AKI + Nephrotic Syndrome + M-spike on SPEP and sFLC K:L 3.78 1. ?Cast nephropathy, AL Amyloid (less likley with Kappa LC)? 2. Could be just progressive HTN and DM2; recent A1c 7.2% 1. No clear evidence of DR or neuropathy 3. ANCA, ASO, HIV, GBM Ab, HCV, HBV, ANA, dsDNA negative. C3 and &C4 WNL.   4. BM Bx 4/27 5. Renal Bx 4/27 6. qDay lasix (? overdiuresed) 7. No HD needs now, but worrisome for possibility in near future 8. No anticoagulation for  VTE Px for at least 48h post Bx 2. Probable Multiple Myeloma 3. HTN: stable, ARB held 4. DM2: unclear history 5. Hyperphosphatemia: start PhosLo 2qAC 6. Anemia: stable post Bx. Monitor while BM Bx results pending  Pearson Grippe MD 04/03/2015, 11:26 AM   Recent Labs Lab 04/01/15 0520 04/02/15 0520 04/03/15 0410  NA 134* 131* 133*  K 4.8 4.7 4.8  CL 94* 92* 92*  CO2 29 31 33*  GLUCOSE 137* 144* 164*  BUN 51* 54* 55*  CREATININE 5.67* 6.04* 6.44*  CALCIUM 8.0* 7.9* 7.8*  PHOS 7.0* 7.1* 7.6*    Recent Labs Lab 03/31/15 0353 04/02/15 0520 04/03/15 0850  WBC 6.0 5.5 6.1  NEUTROABS  --  2.9  --   HGB 7.6* 7.4* 7.3*  HCT 23.7* 23.4* 23.0*  MCV 88.1 88.0 88.1  PLT 275 292 309

## 2015-04-04 ENCOUNTER — Inpatient Hospital Stay (HOSPITAL_COMMUNITY): Payer: Medicaid Other

## 2015-04-04 LAB — GLUCOSE, CAPILLARY
GLUCOSE-CAPILLARY: 153 mg/dL — AB (ref 70–99)
Glucose-Capillary: 102 mg/dL — ABNORMAL HIGH (ref 70–99)
Glucose-Capillary: 139 mg/dL — ABNORMAL HIGH (ref 70–99)
Glucose-Capillary: 94 mg/dL (ref 70–99)

## 2015-04-04 LAB — CBC
HEMATOCRIT: 22 % — AB (ref 39.0–52.0)
Hemoglobin: 7 g/dL — ABNORMAL LOW (ref 13.0–17.0)
MCH: 27.9 pg (ref 26.0–34.0)
MCHC: 31.8 g/dL (ref 30.0–36.0)
MCV: 87.6 fL (ref 78.0–100.0)
Platelets: 294 10*3/uL (ref 150–400)
RBC: 2.51 MIL/uL — ABNORMAL LOW (ref 4.22–5.81)
RDW: 14.9 % (ref 11.5–15.5)
WBC: 5.8 10*3/uL (ref 4.0–10.5)

## 2015-04-04 LAB — RENAL FUNCTION PANEL
Albumin: 2 g/dL — ABNORMAL LOW (ref 3.5–5.2)
Anion gap: 11 (ref 5–15)
BUN: 59 mg/dL — ABNORMAL HIGH (ref 6–23)
CO2: 30 mmol/L (ref 19–32)
CREATININE: 6.92 mg/dL — AB (ref 0.50–1.35)
Calcium: 8.2 mg/dL — ABNORMAL LOW (ref 8.4–10.5)
Chloride: 92 mmol/L — ABNORMAL LOW (ref 96–112)
GFR calc Af Amer: 9 mL/min — ABNORMAL LOW (ref >90)
GFR, EST NON AFRICAN AMERICAN: 8 mL/min — AB (ref >90)
Glucose, Bld: 103 mg/dL — ABNORMAL HIGH (ref 70–99)
Phosphorus: 7.7 mg/dL — ABNORMAL HIGH (ref 2.3–4.6)
Potassium: 5 mmol/L (ref 3.5–5.1)
Sodium: 133 mmol/L — ABNORMAL LOW (ref 135–145)

## 2015-04-04 MED ORDER — CALCIUM ACETATE (PHOS BINDER) 667 MG PO CAPS
1334.0000 mg | ORAL_CAPSULE | Freq: Three times a day (TID) | ORAL | Status: DC
  Administered 2015-04-04 – 2015-04-12 (×21): 1334 mg via ORAL
  Filled 2015-04-04 (×28): qty 2

## 2015-04-04 MED ORDER — LIDOCAINE-PRILOCAINE 2.5-2.5 % EX CREA
1.0000 "application " | TOPICAL_CREAM | CUTANEOUS | Status: DC | PRN
  Filled 2015-04-04: qty 5

## 2015-04-04 MED ORDER — HEPARIN SODIUM (PORCINE) 5000 UNIT/ML IJ SOLN
5000.0000 [IU] | Freq: Three times a day (TID) | INTRAMUSCULAR | Status: AC
  Administered 2015-04-04 – 2015-04-12 (×20): 5000 [IU] via SUBCUTANEOUS
  Filled 2015-04-04 (×21): qty 1

## 2015-04-04 MED ORDER — MIDAZOLAM HCL 2 MG/2ML IJ SOLN
INTRAMUSCULAR | Status: AC | PRN
  Administered 2015-04-04: 1 mg via INTRAVENOUS

## 2015-04-04 MED ORDER — HEPARIN SODIUM (PORCINE) 1000 UNIT/ML DIALYSIS: 1000.0000 [IU] | INTRAMUSCULAR | Status: DC | PRN

## 2015-04-04 MED ORDER — ALTEPLASE 2 MG IJ SOLR
2.0000 mg | Freq: Once | INTRAMUSCULAR | Status: AC | PRN
  Filled 2015-04-04: qty 2

## 2015-04-04 MED ORDER — HEPARIN SODIUM (PORCINE) 1000 UNIT/ML IJ SOLN
INTRAMUSCULAR | Status: AC
  Filled 2015-04-04: qty 1

## 2015-04-04 MED ORDER — PENTAFLUOROPROP-TETRAFLUOROETH EX AERO: 1.0000 "application " | INHALATION_SPRAY | CUTANEOUS | Status: DC | PRN

## 2015-04-04 MED ORDER — FENTANYL CITRATE (PF) 100 MCG/2ML IJ SOLN
INTRAMUSCULAR | Status: AC
  Filled 2015-04-04: qty 2

## 2015-04-04 MED ORDER — MIDAZOLAM HCL 2 MG/2ML IJ SOLN
INTRAMUSCULAR | Status: AC
  Filled 2015-04-04: qty 2

## 2015-04-04 MED ORDER — SODIUM CHLORIDE 0.9 % IV SOLN: 100.0000 mL | INTRAVENOUS | Status: DC | PRN

## 2015-04-04 MED ORDER — FENTANYL CITRATE (PF) 100 MCG/2ML IJ SOLN
INTRAMUSCULAR | Status: AC | PRN
  Administered 2015-04-04: 50 ug via INTRAVENOUS

## 2015-04-04 MED ORDER — LIDOCAINE HCL (PF) 1 % IJ SOLN: 5.0000 mL | INTRAMUSCULAR | Status: DC | PRN

## 2015-04-04 MED ORDER — NEPRO/CARBSTEADY PO LIQD
237.0000 mL | ORAL | Status: DC | PRN
  Filled 2015-04-04: qty 237

## 2015-04-04 MED ORDER — CEFAZOLIN SODIUM-DEXTROSE 2-3 GM-% IV SOLR
2.0000 g | INTRAVENOUS | Status: AC
  Filled 2015-04-04 (×3): qty 50

## 2015-04-04 MED ORDER — HEPARIN SODIUM (PORCINE) 1000 UNIT/ML DIALYSIS: 20.0000 [IU]/kg | INTRAMUSCULAR | Status: DC | PRN

## 2015-04-04 NOTE — Sedation Documentation (Signed)
Patient denies pain and is resting comfortably.  

## 2015-04-04 NOTE — Consult Note (Signed)
[facility] [place] HISTORY AND PHYSICAL   History of Present Illness:  Patient is a 59 y.o. year old male who presents for placement of a permanent hemodialysis access. The patient is right handed.  The patient is not currently on hemodialysis but is having a catheter placed today by interventional radiology.  The cause of renal failure is thought to be secondary to most likely hypertension and diabetes.  Pt was admitted with CHF and possible multiple myeloma.  Past Medical History  Diagnosis Date  . Hypertension   . Diabetes mellitus without complication     Past Surgical History  Procedure Laterality Date  . Colonoscopy       Social History History  Substance Use Topics  . Smoking status: Never Smoker   . Smokeless tobacco: Not on file  . Alcohol Use: No    Family History History reviewed. No pertinent family history.  Allergies  No Known Allergies   Current Facility-Administered Medications  Medication Dose Route Frequency Provider Last Rate Last Dose  . 0.9 %  sodium chloride infusion  250 mL Intravenous PRN Caren Griffins, MD 10 mL/hr at 03/23/15 1503 250 mL at 03/23/15 1503  . acetaminophen (TYLENOL) tablet 650 mg  650 mg Oral Q4H PRN Costin Karlyne Greenspan, MD      . calcitRIOL (ROCALTROL) capsule 0.25 mcg  0.25 mcg Oral Q M,W,F Donato Heinz, MD   0.25 mcg at 04/04/15 1042  . calcium acetate (PHOSLO) capsule 1,334 mg  1,334 mg Oral TID WC Florencia Reasons, MD   1,334 mg at 04/04/15 1042  . ceFAZolin (ANCEF) IVPB 2 g/50 mL premix  2 g Intravenous On Call Monia Sabal, PA-C      . ferrous sulfate tablet 325 mg  325 mg Oral Q breakfast Janece Canterbury, MD   325 mg at 04/04/15 0854  . gabapentin (NEURONTIN) capsule 300 mg  300 mg Oral QHS Donato Heinz, MD   300 mg at 04/03/15 2304  . heparin injection 5,000 Units  5,000 Units Subcutaneous 3 times per day Monia Sabal, PA-C   5,000 Units at 04/04/15 1400  . hydrALAZINE (APRESOLINE) injection 10 mg   10 mg Intravenous Q4H PRN Caren Griffins, MD   10 mg at 03/23/15 1055  . hydrALAZINE (APRESOLINE) tablet 100 mg  100 mg Oral TID Janece Canterbury, MD   100 mg at 04/04/15 1041  . hydrocerin (EUCERIN) cream   Topical BID Janece Canterbury, MD      . insulin aspart (novoLOG) injection 0-9 Units  0-9 Units Subcutaneous TID WC Janece Canterbury, MD   1 Units at 04/03/15 1716  . insulin detemir (LEVEMIR) injection 12 Units  12 Units Subcutaneous QHS Donne Hazel, MD   12 Units at 04/03/15 2200  . isosorbide mononitrate (IMDUR) 24 hr tablet 60 mg  60 mg Oral Daily Janece Canterbury, MD   60 mg at 04/04/15 1042  . metoprolol (LOPRESSOR) tablet 100 mg  100 mg Oral BID WC Janece Canterbury, MD   100 mg at 04/04/15 0809  . ondansetron (ZOFRAN) injection 4 mg  4 mg Intravenous Q6H PRN Costin Karlyne Greenspan, MD      . sodium chloride 0.9 % injection 3 mL  3 mL Intravenous Q12H Costin Karlyne Greenspan, MD   3 mL at 04/04/15 1043  . sodium chloride 0.9 % injection 3 mL  3 mL Intravenous PRN Costin Karlyne Greenspan, MD        ROS:   General:  No  weight loss, Fever, chills  HEENT: No recent headaches, no nasal bleeding, no visual changes, no sore throat  Neurologic: No dizziness, blackouts, seizures. No recent symptoms of stroke or mini- stroke. No recent episodes of slurred speech, or temporary blindness.  Cardiac: No recent episodes of chest pain/pressure, + shortness of breath at rest.  + shortness of breath with exertion.  Denies history of atrial fibrillation or irregular heartbeat  Vascular: No history of rest pain in feet.  No history of claudication.  No history of non-healing ulcer, No history of DVT   Pulmonary: No home oxygen, no productive cough, no hemoptysis,  No asthma or wheezing  Musculoskeletal:  [ ]  Arthritis, [ ]  Low back pain,  [ ]  Joint pain  Hematologic:No history of hypercoagulable state.  No history of easy bleeding.  No history of anemia  Gastrointestinal: No hematochezia or melena,  No  gastroesophageal reflux, no trouble swallowing  Urinary: [ ]  chronic Kidney disease, [ ]  on HD - [ ]  MWF or [ ]  TTHS, [ ]  Burning with urination, [ ]  Frequent urination, [ ]  Difficulty urinating;   Skin: No rashes  Psychological: No history of anxiety,  No history of depression   Physical Examination  Filed Vitals:   04/03/15 1501 04/03/15 2026 04/04/15 0510 04/04/15 1038  BP: 112/59 104/49 130/73 124/65  Pulse: 66 65 64 64  Temp: 98 F (36.7 C) 98.3 F (36.8 C) 98.6 F (37 C)   TempSrc: Oral Oral Oral   Resp: 18 18 20    Height:      Weight:   236 lb 9.6 oz (107.321 kg)   SpO2: 93% 96% 93% 99%    Body mass index is 39.37 kg/(m^2).  General:  Alert and oriented, no acute distress HEENT: Normal Neck: No bruit  Pulmonary: Clear to auscultation bilaterally Cardiac: Regular Rate and Rhythm Gastrointestinal: Soft, non-tender, non-distended, no mass, obese Skin: No rash Extremity Pulses:  2+ radial, brachial pulses bilaterally Musculoskeletal: No deformity or edema in upper extremities  Neurologic: Upper and lower extremity motor 5/5 and symmetric  DATA: vein mapping pending   ASSESSMENT: Needs long term hemodialysis access   PLAN:  Will review vein map and make plans for access next week.  Ruta Hinds, MD [facility] [place] Office: [phone] Pager: [phone]

## 2015-04-04 NOTE — Procedures (Signed)
RIJV HD Cath 23 cm SVC RA No comp

## 2015-04-04 NOTE — Progress Notes (Signed)
[facility] PROGRESS NOTE  James [name] QIO:[id number] DOB: [birth year] DOA: 03/22/2015 PCP: No primary care provider on file.  Brief Summary  59yo who presented with scrotal swelling and generalized edema. The patient was found to have elevated creatinine and evidence of heart failure.   Cardiology was consulted and started IV lasix.  Records demonstrated he had sharp rise in creatinine over the last few months.  Nephrology was consulted.  On his SPEP he had an M spike. UPEP is completing urine collection today. It is possible that his acute kidney injury is secondary to multiple myeloma as opposed to hypertension and diabetes. Nephrology recommended pursuing bone marrow biopsy instead of kidney biopsy showed his confirmatory test such as IFE,/lambda light chain, beta-2 microglobulin return positive.  Bone survery was negative.  Discordant protein on urinalysis compared to total protein collected from 24-hour urine collection suspicious for multiple myeloma.  Bone marrow and kidney biopsy on Wed.  Assessment/Plan  Acute hypoxic respiratory failure secondary to acute on chronic systolic and diastolic congestive heart failure, on room air, swelling improving -    2-D echo with ejection fraction 45-50% with LV diastolic dysfunction -   TSH within normal limits -   Appreciate cardiology assistance, cardiology following prn, last note on 4/25 -   Lasix per nephrology   Acute kidney injury, creatinine was 1.2 in January 2015 but his creatinine was 3.5 in February 2016.  May be due to DM and HTN, but concern for MM.  Creatinine trending up -   Appreciate Nephrology assistance -   Avoid ACE/ARB -   Urine protein to creatinine 7.51, 24-hour urine protein 5712 but UA with low protein (albumin).  This is consistent with MM. -   IFE pending -  Renal biopsy 4/27, prelim path result: collapsing FSGS -rIJ HD cath placed on 4/29, pending dialysis, management per  nephrology  Probable multiple myeloma  with elevated M spike on SPEP -  Follow-up UPEP -  Beta-2 microglobulin 8.3, cryoglobulin pending -  IgG 2678, IgA 102, IgM <6 -  Kappa/lambda ratio mildly elevated 3.83 (kappa 114, lambda 30) -  Bone survey negative -  Bone marrow biopsy:  Morphology, flow cytometry, cytogenetics, and myeloma panel -  Case discussed with Dr. Arbutus Ped, Oncology -  IR for bone marrow biopsy on 4/27  Diabetes mellitus type 2 with renal complications, CBGs at goal -   Continue Levemir 12 units with sliding scale insulin  Hypertension, blood pressure well controlled -   Continue Metoprolol to 100 mg twice a day./hydralazine to 100 mg TID/ Imdur -    Added norvasc on 4/25, norvasc d/ced on 4/28, currently bp low normal, persistent lower extremity edema -   ACEI held due to AKI  Iron deficiency anemia and probable multiple myeloma -  Started iron supplementation -  Avoid epo for now while working up MM -  GI follow up -  Folate and b12 wnl  Secondary hyperparathyroidism -  Started on calcitriol  Diet:   2 g sodium Access:   PIV IVF:   off Proph:   Heparin held for 48hrs post biopsy. Start scds  Code Status:  full Family Communication:  Patient alone Disposition Plan:  Remain inpatient    Consultants:   cardiology    nephrology   IR for bone marrow biopsy/renal biopsy  Oncology Dr. Arbutus Ped by phone  Procedures:   echo  Bone marrow biopsy/renal biopsy 4/27  Antibiotics:   none   HPI/Subjective:  Reported less scrotal edema,  persistent lower extremity edema, good appetite,  Good urine output, Denies shortness of breath.  Denies pain, cr worsening.  Objective: Filed Vitals:   04/04/15 1648 04/04/15 1700 04/04/15 1705 04/04/15 1719  BP: 145/75 144/76 129/73 125/66  Pulse: 64 63 62 63  Temp:      TempSrc:      Resp:  12 10 10   Height:      Weight:      SpO2: 98% 94% 99% 93%    Intake/Output Summary (Last 24 hours) at 04/04/15 1802 Last data filed at 04/04/15 1319  Gross  per 24 hour  Intake   1300 ml  Output   2325 ml  Net  -1025 ml   Filed Weights   04/02/15 0507 04/03/15 0533 04/04/15 0510  Weight: 108.183 kg (238 lb 8 oz) 108.274 kg (238 lb 11.2 oz) 107.321 kg (236 lb 9.6 oz)    Exam:   General:   Obese male, no acute distress, sitting at edge of bed  HEENT:  NCAT, MMM  Cardiovascular:  RRR, nl S1, S2 no mrg, 2+ pulses, warm extremities  Respiratory:  CTAB, no increased WOB  MSK:   Normal tone and bulk, 1+ slow pitting bilateral LEE, ankles are visible. resolving Hands and eyelids edema   GU: deferred today  Data Reviewed: Basic Metabolic Panel:  Recent Labs Lab 03/31/15 0353 04/01/15 0520 04/02/15 0520 04/03/15 0410 04/04/15 0558  NA 133* 134* 131* 133* 133*  K 4.9 4.8 4.7 4.8 5.0  CL 95* 94* 92* 92* 92*  CO2 29 29 31  33* 30  GLUCOSE 159* 137* 144* 164* 103*  BUN 45* 51* 54* 55* 59*  CREATININE 5.32* 5.67* 6.04* 6.44* 6.92*  CALCIUM 7.9* 8.0* 7.9* 7.8* 8.2*  PHOS 6.5* 7.0* 7.1* 7.6* 7.7*   Liver Function Tests:  Recent Labs Lab 03/31/15 0353 04/01/15 0520 04/02/15 0520 04/03/15 0410 04/04/15 0558  ALBUMIN 1.8* 1.8* 1.9* 1.8* 2.0*   No results for input(s): LIPASE, AMYLASE in the last 168 hours. No results for input(s): AMMONIA in the last 168 hours. CBC:  Recent Labs Lab 03/30/15 0745 03/31/15 0353 04/02/15 0520 04/03/15 0850 04/04/15 0558  WBC 4.5 6.0 5.5 6.1 5.8  NEUTROABS  --   --  2.9  --   --   HGB 10.4* 7.6* 7.4* 7.3* 7.0*  HCT 33.0* 23.7* 23.4* 23.0* 22.0*  MCV 87.5 88.1 88.0 88.1 87.6  PLT 221 275 292 309 294   Cardiac Enzymes: No results for input(s): CKTOTAL, CKMB, CKMBINDEX, TROPONINI in the last 168 hours. BNP (last 3 results)  Recent Labs  03/22/15 1903  BNP 1043.8*    ProBNP (last 3 results) No results for input(s): PROBNP in the last 8760 hours.  CBG:  Recent Labs Lab 04/03/15 1637 04/03/15 2035 04/04/15 0550 04/04/15 1106 04/04/15 1620  GLUCAP 136* 163* 102* 139* 94     No results found for this or any previous visit (from the past 240 hour(s)).   Studies: No results found.  Scheduled Meds: . calcitRIOL  0.25 mcg Oral Q M,W,F  . calcium acetate  1,334 mg Oral TID WC  .  ceFAZolin (ANCEF) IV  2 g Intravenous On Call  . fentaNYL      . ferrous sulfate  325 mg Oral Q breakfast  . gabapentin  300 mg Oral QHS  . heparin      . heparin  5,000 Units Subcutaneous 3 times per day  . hydrALAZINE  100 mg Oral TID  .  hydrocerin   Topical BID  . insulin aspart  0-9 Units Subcutaneous TID WC  . insulin detemir  12 Units Subcutaneous QHS  . isosorbide mononitrate  60 mg Oral Daily  . metoprolol tartrate  100 mg Oral BID WC  . midazolam      . sodium chloride  3 mL Intravenous Q12H   Continuous Infusions:   Principal Problem:   CHF (congestive heart failure) Active Problems:   Peripheral edema   HTN (hypertension)   DM (diabetes mellitus)   Acute renal failure   Anemia   Hypokalemia   Congestive heart disease   Hypoxia   Acute on chronic diastolic CHF (congestive heart failure), NYHA class 4   Multiple myeloma    Time spent: 25 min    Rushil Kimbrell  [facility] Pager [phone]. If 7PM-7AM, please contact night-coverage at [email or web address], password [facility] 04/04/2015, 6:02 PM  LOS: 13 days

## 2015-04-04 NOTE — Progress Notes (Addendum)
Admit: 03/22/2015 LOS: 13  32M with hx/o DM2 and HTN admitted with nephrotic proteinuria, AKI, positive M-Spike on SPEP and K:L sFLC 3.78.  Found to have LVH and dCHF  Subjective:  Still with good UOP Weight at 236lb, down 20lb from admission Edema improved Prelim renal biopsy expected today SCR continues to climb, BUN stable, K and HCO3 ok Mildly confused this morning but denies nausea, vomiting, anorexia, dysgeusia Has mild asterixis on exam  04/28 0701 - 04/29 0700 In: 1560 [P.O.:1560] Out: 1725 [[id number]]  Filed Weights   04/02/15 0507 04/03/15 0533 04/04/15 0510  Weight: 108.183 kg (238 lb 8 oz) 108.274 kg (238 lb 11.2 oz) 107.321 kg (236 lb 9.6 oz)    Scheduled Meds: . calcitRIOL  0.25 mcg Oral Q M,W,F  . calcium acetate  1,334 mg Oral TID WC  . ferrous sulfate  325 mg Oral Q breakfast  . furosemide  120 mg Intravenous Daily  . gabapentin  300 mg Oral QHS  . heparin  5,000 Units Subcutaneous 3 times per day  . hydrALAZINE  100 mg Oral TID  . hydrocerin   Topical BID  . insulin aspart  0-9 Units Subcutaneous TID WC  . insulin detemir  12 Units Subcutaneous QHS  . isosorbide mononitrate  60 mg Oral Daily  . metoprolol tartrate  100 mg Oral BID WC  . sodium chloride  3 mL Intravenous Q12H   Continuous Infusions:  PRN Meds:.sodium chloride, acetaminophen, hydrALAZINE, ondansetron (ZOFRAN) IV, sodium chloride  Current Labs: reviewed    Physical Exam:  Blood pressure 124/65, pulse 64, temperature 98.6 F (37 C), temperature source Oral, resp. rate 20, height 5' 5"  (1.651 m), weight 107.321 kg (236 lb 9.6 oz), SpO2 99 %. NAD,  RRR, no rub CTAB 2+ LEE, pitting No rashes/lesions  A/P 1. AKI + Nephrotic Syndrome + M-spike on SPEP and sFLC K:L 3.78 1. ?Cast nephropathy, AL Amyloid (less likley with Kappa LC)? 2. Could be just progressive HTN and DM2; recent A1c 7.2% 1. No clear evidence of DR or neuropathy 3. ANCA, ASO, HIV, GBM Ab, HCV, HBV, ANA, dsDNA negative.  C3 and &C4 WNL.   4. BM Bx 4/27, results? 5. Renal Bx 4/27, prelim expected today 6. Hold lasix for 24h 7. Appears to be developing uremia, we'll ask for assistance placing tunneled HD catheter anticipate needing dialysis within the next 24 hours 8. No anticoagulation for VTE Px for at least 48h post Bx 2. Probable Multiple Myeloma 3. HTN: stable, ARB held 4. DM2: unclear history 5. Hyperphosphatemia: cont PhosLo 2qAC 6. Anemia: stable post Bx. Monitor while BM Bx results pending  UPDATE 1230pm 4/29: Preliminary renal biopsy with collapsing FSGS. This is consistent with his rapidly progressive, proteinuric, CKD. Furthermore it is unlikely to be reversible at this date. I think this confirms the plan to move forward with dialysis. He will need long-term access as well.   Pearson Grippe MD 04/04/2015, 11:01 AM   Recent Labs Lab 04/02/15 0520 04/03/15 0410 04/04/15 0558  NA 131* 133* 133*  K 4.7 4.8 5.0  CL 92* 92* 92*  CO2 31 33* 30  GLUCOSE 144* 164* 103*  BUN 54* 55* 59*  CREATININE 6.04* 6.44* 6.92*  CALCIUM 7.9* 7.8* 8.2*  PHOS 7.1* 7.6* 7.7*    Recent Labs Lab 04/02/15 0520 04/03/15 0850 04/04/15 0558  WBC 5.5 6.1 5.8  NEUTROABS 2.9  --   --   HGB 7.4* 7.3* 7.0*  HCT 23.4* 23.0* 22.0*  MCV  88.0 88.1 87.6  PLT 292 309 294

## 2015-04-04 NOTE — Consult Note (Signed)
Chief Complaint: Chief Complaint  Patient presents with  . Groin Swelling  worsening renal fxn  Referring Physician(s): Dr Joelyn Oms  History of Present Illness: James [name] is a 59 y.o. male   Admitted with generalized edema; scrotal swelling Cr rising and CHF Nephrotic syndrome; AKI Uremia Foresee dialysis within 24 hrs or so May need dialysis as OP Now request for hemodialysis tunneled cath placement I have seen and examined pt   Past Medical History  Diagnosis Date  . Hypertension   . Diabetes mellitus without complication     Past Surgical History  Procedure Laterality Date  . Colonoscopy      Allergies: Review of patient's allergies indicates no known allergies.  Medications: Prior to Admission medications   Medication Sig Start Date End Date Taking? Authorizing Provider  furosemide (LASIX) 20 MG tablet Take 20 mg by mouth daily.   Yes Historical Provider, MD  gabapentin (NEURONTIN) 800 MG tablet Take 400 mg by mouth 2 (two) times daily.   Yes Historical Provider, MD  insulin detemir (LEVEMIR) 100 UNIT/ML injection Inject 20 Units into the skin 2 (two) times daily.   Yes Historical Provider, MD  lisinopril (PRINIVIL,ZESTRIL) 20 MG tablet Take 20 mg by mouth daily.   Yes Historical Provider, MD  metoprolol tartrate (LOPRESSOR) 25 MG tablet Take 25 mg by mouth 2 (two) times daily.   Yes Historical Provider, MD     History reviewed. No pertinent family history.  History   Social History  . Marital Status: Single    Spouse Name: N/A  . Number of Children: N/A  . Years of Education: N/A   Social History Main Topics  . Smoking status: Never Smoker   . Smokeless tobacco: Not on file  . Alcohol Use: No  . Drug Use: No  . Sexual Activity: Not on file   Other Topics Concern  . None   Social History Narrative     Review of Systems: A 12 point ROS discussed and pertinent positives are indicated in the HPI above.  All other systems are  negative.  Review of Systems  Constitutional: Positive for activity change, appetite change and fatigue. Negative for fever.  Respiratory: Positive for cough and shortness of breath.   Cardiovascular: Positive for leg swelling.  Gastrointestinal: Negative for abdominal distention.  Genitourinary: Positive for scrotal swelling.  Neurological: Positive for weakness.  Psychiatric/Behavioral: Negative for behavioral problems and confusion.    Vital Signs: BP 124/65 mmHg  Pulse 64  Temp(Src) 98.6 F (37 C) (Oral)  Resp 20  Ht 5' 5"  (1.651 m)  Wt 107.321 kg (236 lb 9.6 oz)  BMI 39.37 kg/m2  SpO2 99%  Physical Exam  Constitutional: He is oriented to person, place, and time. He appears well-developed and well-nourished.  Cardiovascular: Normal rate and regular rhythm.   No murmur heard. Pulmonary/Chest: Effort normal and breath sounds normal. He has no wheezes.  Abdominal: Soft. Bowel sounds are normal. There is no tenderness.  Musculoskeletal: Normal range of motion.  Neurological: He is alert and oriented to person, place, and time.  Skin: Skin is warm and dry.  Psychiatric: He has a normal mood and affect. His behavior is normal. Judgment and thought content normal.  Nursing note and vitals reviewed.   Mallampati Score:  MD Evaluation Airway: WNL Heart: WNL Abdomen: WNL Chest/ Lungs: WNL ASA  Classification: 3 Mallampati/Airway Score: One  Imaging: Dg Chest 2 View  03/22/2015   CLINICAL DATA:  Hypoxia.  Hypertension and diabetes.  EXAM: CHEST  2 VIEW  COMPARISON:  None.  FINDINGS: There is mild cardiomegaly and vascular pedicle widening. Diffuse interstitial opacity with Kerley lines. Small bilateral pleural effusions with atelectasis.  IMPRESSION: CHF.   Electronically Signed   By: Monte Fantasia M.D.   On: 03/22/2015 22:51   US Renal  03/22/2015   CLINICAL DATA:  Renal failure.  Abdominal swelling.  Leg swelling.  EXAM: RENAL/URINARY TRACT ULTRASOUND COMPLETE   COMPARISON:  None.  FINDINGS: Right Kidney:  Length: 13.5 cm. Echogenicity within normal limits. No mass or hydronephrosis visualized.  Left Kidney:  Length: 12.1 cm. Echogenicity within normal limits. There is a 1.4 cm cyst in the interpolar left kidney. No solid mass or hydronephrosis visualized.  Bladder:  Appears normal for degree of bladder distention.  Incidental:  Bilateral pleural effusions noted.  IMPRESSION: 1. Mild asymmetry in renal size, with right kidney mildly enlarged measuring 13.5 cm. No hydronephrosis or localizing abnormality. This may be related to mild renal hypertrophy. 2. Simple cyst in the interpolar left kidney, left kidney is otherwise normal. 3. Bilateral pleural effusions, incidentally noted.   Electronically Signed   By: Jeb Levering M.D.   On: 03/22/2015 22:45   US Biopsy  04/02/2015   CLINICAL DATA:  Acute renal failure, suspect multiple myeloma  EXAM: ULTRASOUND GUIDED CORE BIOPSY OF LEFT RENAL CORTEX  MEDICATIONS: 1.0 mg IV Versed; 50 mcg IV Fentanyl  Total Moderate Sedation Time: 15  PROCEDURE: The procedure, risks, benefits, and alternatives were explained to the patient. Questions regarding the procedure were encouraged and answered. The patient understands and consents to the procedure.  The left flank was prepped with Betadine In a sterile fashion, and a sterile drape was applied covering the operative field. A sterile gown and sterile gloves were used for the procedure. Local anesthesia was provided with 1% Lidocaine.  Previous imaging reviewed. Patient positioned prone. Preliminary ultrasound performed. The left kidney lower pole cortex was localized. Under sterile conditions and local anesthesia, a 16 gauge core biopsy needle was advanced to the left kidney lower pole cortex. 2 16 gauge core biopsies obtained. Samples placed in saline. These were intact and non fragmented. Postprocedure imaging demonstrates a small amount of surrounding lower pole hemorrhage from the  biopsy. No hydronephrosis. Patient tolerated the biopsy well.  COMPLICATIONS: None.  FINDINGS: Imaging confirms needle placement to the left kidney lower pole cortex for core biopsy  IMPRESSION: Successful ultrasound left renal core biopsy   Electronically Signed   By: Jerilynn Mages.  Shick M.D.   On: 04/02/2015 10:55   Ct Biopsy  04/02/2015   CLINICAL DATA:  MULTIPLE MYELOMA  EXAM: CT GUIDED RIGHT ILIAC BONE MARROW ASPIRATION AND CORE BIOPSY  Date:  4/27/20164/27/2016 10:43 am  Radiologist:  M. Daryll Brod, MD  Guidance:  CT  FLUOROSCOPY TIME:  None.  MEDICATIONS AND MEDICAL HISTORY: 1 mg Versed, 50 mcg fentanyl  ANESTHESIA/SEDATION: 15 minutes  CONTRAST:  None.  COMPLICATIONS: None  PROCEDURE: Informed consent was obtained from the patient following explanation of the procedure, risks, benefits and alternatives. The patient understands, agrees and consents for the procedure. All questions were addressed. A time out was performed.  The patient was positioned prone and noncontrast localization CT was performed of the pelvis to demonstrate the iliac marrow spaces.  Maximal barrier sterile technique utilized including caps, mask, sterile gowns, sterile gloves, large sterile drape, hand hygiene, and betadine prep.  Under sterile conditions and local anesthesia, an 11 gauge coaxial bone biopsy needle was  advanced into the right iliac marrow space. Needle position was confirmed with CT imaging. Initially, bone marrow aspiration was performed. Next, the 11 gauge outer cannula was utilized to obtain a right iliac bone marrow core biopsy. Needle was removed. Hemostasis was obtained with compression. The patient tolerated the procedure well. Samples were prepared with the cytotechnologist. No immediate complications.  IMPRESSION: CT guided right iliac bone marrow aspiration and core biopsy.   Electronically Signed   By: Jerilynn Mages.  Shick M.D.   On: 04/02/2015 10:53   Dg Bone Survey Met  03/28/2015   CLINICAL DATA:  Metastatic bone  survey, multiple myeloma  EXAM: METASTATIC BONE SURVEY  COMPARISON:  03/22/2015  FINDINGS: Twenty-three images of skeletal structures submitted. Frontal view of the chest shows no destructive bony lesions. There is small left pleural effusion with left basilar atelectasis or infiltrate. Frontal and lateral view of the skull shows no lytic or blastic bone lesions. Frontal view bilateral shoulder and bilateral humerus shows no lytic or blastic lesions. Frontal and lateral view of the cervical spine shows the alignment preserved. No lytic or blastic lesions.  Frontal and lateral view of thoracic spine and lumbar spine shows alignment preserved. No lytic or blastic lesions. Frontal view bilateral hips and femur shows no lytic or blastic lesions. Frontal view bilateral tibia and fibula shows no lytic or blastic lesions. Bilateral forearm shows no lytic or blastic lesion.  IMPRESSION: No lytic or blastic lesions are identified. No evidence of metastatic disease. There is small left pleural effusion left basilar atelectasis or infiltrate.   Electronically Signed   By: Lahoma Crocker M.D.   On: 03/28/2015 14:27    Labs:  CBC:  Recent Labs  03/31/15 0353 04/02/15 0520 04/03/15 0850 04/04/15 0558  WBC 6.0 5.5 6.1 5.8  HGB 7.6* 7.4* 7.3* 7.0*  HCT 23.7* 23.4* 23.0* 22.0*  PLT 275 292 309 294    COAGS:  Recent Labs  04/02/15 0520  INR 1.21    BMP:  Recent Labs  04/01/15 0520 04/02/15 0520 04/03/15 0410 04/04/15 0558  NA 134* 131* 133* 133*  K 4.8 4.7 4.8 5.0  CL 94* 92* 92* 92*  CO2 29 31 33* 30  GLUCOSE 137* 144* 164* 103*  BUN 51* 54* 55* 59*  CALCIUM 8.0* 7.9* 7.8* 8.2*  CREATININE 5.67* 6.04* 6.44* 6.92*  GFRNONAA 10* 9* 9* 8*  GFRAA 12* 11* 10* 9*    LIVER FUNCTION TESTS:  Recent Labs  03/23/15 0330  04/01/15 0520 04/02/15 0520 04/03/15 0410 04/04/15 0558  BILITOT 0.6  --   --   --   --   --   AST 29  --   --   --   --   --   ALT 23  --   --   --   --   --   ALKPHOS  159*  --   --   --   --   --   PROT 7.1  --   --   --   --   --   ALBUMIN 1.7*  < > 1.8* 1.9* 1.8* 2.0*  < > = values in this interval not displayed.  TUMOR MARKERS: No results for input(s): AFPTM, CEA, CA199, CHROMGRNA in the last 8760 hours.  Assessment and Plan:  Nephrotic syndome AKI Uremia Worsening renal fxn Need for tunneled dialysis cath Risks and Benefits discussed with the patient including, but not limited to bleeding, infection, vascular injury, pneumothorax which may require chest tube  placement, air embolism or even death All of the patient's questions were answered, patient is agreeable to proceed. Consent signed and in chart.   Thank you for this interesting consult.  I greatly enjoyed meeting James [name] and look forward to participating in their care.  Signed: Refugia Laneve A 04/04/2015, 11:48 AM   I spent a total of 20 Minutes  in face to face in clinical consultation, greater than 50% of which was counseling/coordinating care for HD cath placement

## 2015-04-05 DIAGNOSIS — N032 Chronic nephritic syndrome with diffuse membranous glomerulonephritis: Secondary | ICD-10-CM

## 2015-04-05 LAB — RENAL FUNCTION PANEL
ANION GAP: 10 (ref 5–15)
Albumin: 2 g/dL — ABNORMAL LOW (ref 3.5–5.2)
BUN: 63 mg/dL — AB (ref 6–23)
CHLORIDE: 93 mmol/L — AB (ref 96–112)
CO2: 31 mmol/L (ref 19–32)
Calcium: 8.8 mg/dL (ref 8.4–10.5)
Creatinine, Ser: 6.91 mg/dL — ABNORMAL HIGH (ref 0.50–1.35)
GFR calc Af Amer: 9 mL/min — ABNORMAL LOW (ref >90)
GFR calc non Af Amer: 8 mL/min — ABNORMAL LOW (ref >90)
Glucose, Bld: 97 mg/dL (ref 70–99)
POTASSIUM: 5.4 mmol/L — AB (ref 3.5–5.1)
Phosphorus: 7.6 mg/dL — ABNORMAL HIGH (ref 2.3–4.6)
Sodium: 134 mmol/L — ABNORMAL LOW (ref 135–145)

## 2015-04-05 LAB — CBC
HCT: 23.6 % — ABNORMAL LOW (ref 39.0–52.0)
HEMATOCRIT: 23 % — AB (ref 39.0–52.0)
Hemoglobin: 7.3 g/dL — ABNORMAL LOW (ref 13.0–17.0)
Hemoglobin: 7.3 g/dL — ABNORMAL LOW (ref 13.0–17.0)
MCH: 27.1 pg (ref 26.0–34.0)
MCH: 27.9 pg (ref 26.0–34.0)
MCHC: 30.9 g/dL (ref 30.0–36.0)
MCHC: 31.7 g/dL (ref 30.0–36.0)
MCV: 87.7 fL (ref 78.0–100.0)
MCV: 87.8 fL (ref 78.0–100.0)
Platelets: 326 10*3/uL (ref 150–400)
Platelets: 299 10*3/uL (ref 150–400)
RBC: 2.69 MIL/uL — AB (ref 4.22–5.81)
RBC: 2.62 MIL/uL — ABNORMAL LOW (ref 4.22–5.81)
RDW: 15.1 % (ref 11.5–15.5)
RDW: 14.9 % (ref 11.5–15.5)
WBC: 6.5 10*3/uL (ref 4.0–10.5)
WBC: 6.9 10*3/uL (ref 4.0–10.5)

## 2015-04-05 LAB — GLUCOSE, CAPILLARY
GLUCOSE-CAPILLARY: 93 mg/dL (ref 70–99)
GLUCOSE-CAPILLARY: 151 mg/dL — AB (ref 70–99)
GLUCOSE-CAPILLARY: 173 mg/dL — AB (ref 70–99)
Glucose-Capillary: 122 mg/dL — ABNORMAL HIGH (ref 70–99)

## 2015-04-05 LAB — HEPATITIS C ANTIBODY: HCV Ab: NEGATIVE

## 2015-04-05 LAB — HEPATITIS B SURFACE ANTIGEN: Hepatitis B Surface Ag: NEGATIVE

## 2015-04-05 LAB — HEPATITIS B CORE ANTIBODY, TOTAL: Hep B Core Total Ab: NEGATIVE

## 2015-04-05 LAB — HEPATITIS B SURFACE ANTIBODY,QUALITATIVE: Hep B S Ab: NEGATIVE

## 2015-04-05 MED ORDER — FUROSEMIDE 10 MG/ML IJ SOLN
120.0000 mg | Freq: Two times a day (BID) | INTRAVENOUS | Status: DC
  Administered 2015-04-05 – 2015-04-06 (×3): 120 mg via INTRAVENOUS
  Filled 2015-04-05 (×5): qty 12

## 2015-04-05 NOTE — Progress Notes (Signed)
Vein map ordered 4/29 still pending.  Will review again when vein map complete  Ruta Hinds, MD [facility] Office: [phone] Pager: [phone]

## 2015-04-05 NOTE — Progress Notes (Signed)
[facility] PROGRESS NOTE  Ali Pascasio ZOX:[id number] DOB: [birth year] DOA: 03/22/2015 PCP: No primary care provider on file.  Brief Summary  59yo who presented with scrotal swelling and generalized edema. The patient was found to have elevated creatinine and evidence of heart failure.   Cardiology was consulted and started IV lasix.  Records demonstrated he had sharp rise in creatinine over the last few months.  Nephrology was consulted.  On his SPEP he had an M spike. UPEP is completing urine collection today. It is possible that his acute kidney injury is secondary to multiple myeloma as opposed to hypertension and diabetes. Nephrology recommended pursuing bone marrow biopsy instead of kidney biopsy showed his confirmatory test such as IFE,/lambda light chain, beta-2 microglobulin return positive.  Bone survery was negative.  Discordant protein on urinalysis compared to total protein collected from 24-hour urine collection suspicious for multiple myeloma.  Bone marrow and kidney biopsy on Wed.  Assessment/Plan  Acute hypoxic respiratory failure secondary to acute on chronic systolic and diastolic congestive heart failure, on room air, swelling improving -    2-D echo with ejection fraction 45-50% with LV diastolic dysfunction -   TSH within normal limits -   Appreciate cardiology assistance, cardiology following prn, last note on 4/25 -   Lasix per nephrology   Acute kidney injury,  -  Avoid ACE/ARB --  Renal biopsy 4/27, collapsing FSGS -rIJ HD cath placed on 4/29, pending dialysis, management per  nephrology  Probable multiple myeloma  - Beta-2 microglobulin 8.3, cryoglobulin pending, upep IFE igGkappa, spep mspike 1.8%, SFL kappa 113 k/l ratio 3.7 -  IgG 2678, IgA 102, IgM <6 -   Bone survey negative -  Bone marrow biopsy 4/27:  11% plasma cell /keppa restricted, cytogenetics pending -  Formal Oncology consult called on 4/30, Dr Myna Hidalgo will see patient   Diabetes mellitus  type 2 with renal complications, CBGs at goal -   Continue Levemir 12 units with sliding scale insulin  Hypertension, blood pressure well controlled -   Continue Metoprolol to 100 mg twice a day./hydralazine to 100 mg TID/ Imdur -    Added norvasc on 4/25, norvasc d/ced on 4/28, currently bp low normal, persistent lower extremity edema -   ACEI held due to AKI  Iron deficiency anemia and probable multiple myeloma -  Started iron supplementation -  Avoid epo for now while working up MM -  GI follow up -  Folate and b12 wnl  Secondary hyperparathyroidism -  Started on calcitriol  Diet:   2 g sodium Access:   PIV IVF:   off Proph:   Heparin held for 48hrs post biopsy. Start scds, heparin subQ restarted on 4/30  Code Status:  full Family Communication:  Patient alone Disposition Plan:  Remain inpatient    Consultants:   cardiology    nephrology   IR for bone marrow biopsy/renal biopsy  Oncology   Procedures:   echo  Bone marrow biopsy/renal biopsy 4/27  Antibiotics:   none   HPI/Subjective:  Reported less scrotal edema, persistent lower extremity edema,  Good urine output, Denies shortness of breath.  Denies pain, cr worsening.  Objective: Filed Vitals:   04/04/15 2128 04/05/15 0507 04/05/15 0900 04/05/15 1057  BP: 137/70 133/71 145/79 133/65  Pulse: 62 65 64 65  Temp: 98.2 F (36.8 C) 98.2 F (36.8 C) 97.9 F (36.6 C)   TempSrc: Oral Oral Oral   Resp: 14 12 18    Height:  Weight:  106.686 kg (235 lb 3.2 oz)    SpO2: 95% 97% 97%     Intake/Output Summary (Last 24 hours) at 04/05/15 1654 Last data filed at 04/05/15 1006  Gross per 24 hour  Intake    840 ml  Output    800 ml  Net     40 ml   Filed Weights   04/03/15 0533 04/04/15 0510 04/05/15 0507  Weight: 108.274 kg (238 lb 11.2 oz) 107.321 kg (236 lb 9.6 oz) 106.686 kg (235 lb 3.2 oz)    Exam:   General:   Obese male, no acute distress, sitting at edge of bed  HEENT:  NCAT,  MMM  Cardiovascular:  RRR, nl S1, S2 no mrg, 2+ pulses, warm extremities  Respiratory:  CTAB, no increased WOB  MSK:   Normal tone and bulk, 1+ slow pitting bilateral LEE, ankles are visible. resolving Hands and eyelids edema   GU: deferred today  Data Reviewed: Basic Metabolic Panel:  Recent Labs Lab 04/01/15 0520 04/02/15 0520 04/03/15 0410 04/04/15 0558 04/05/15 0555  NA 134* 131* 133* 133* 134*  K 4.8 4.7 4.8 5.0 5.4*  CL 94* 92* 92* 92* 93*  CO2 29 31 33* 30 31  GLUCOSE 137* 144* 164* 103* 97  BUN 51* 54* 55* 59* 63*  CREATININE 5.67* 6.04* 6.44* 6.92* 6.91*  CALCIUM 8.0* 7.9* 7.8* 8.2* 8.8  PHOS 7.0* 7.1* 7.6* 7.7* 7.6*   Liver Function Tests:  Recent Labs Lab 04/01/15 0520 04/02/15 0520 04/03/15 0410 04/04/15 0558 04/05/15 0555  ALBUMIN 1.8* 1.9* 1.8* 2.0* 2.0*   No results for input(s): LIPASE, AMYLASE in the last 168 hours. No results for input(s): AMMONIA in the last 168 hours. CBC:  Recent Labs Lab 03/31/15 0353 04/02/15 0520 04/03/15 0850 04/04/15 0558 04/05/15 0555  WBC 6.0 5.5 6.1 5.8 6.5  NEUTROABS  --  2.9  --   --   --   HGB 7.6* 7.4* 7.3* 7.0* 7.3*  HCT 23.7* 23.4* 23.0* 22.0* 23.6*  MCV 88.1 88.0 88.1 87.6 87.7  PLT 275 292 309 294 326   Cardiac Enzymes: No results for input(s): CKTOTAL, CKMB, CKMBINDEX, TROPONINI in the last 168 hours. BNP (last 3 results)  Recent Labs  03/22/15 1903  BNP 1043.8*    ProBNP (last 3 results) No results for input(s): PROBNP in the last 8760 hours.  CBG:  Recent Labs Lab 04/04/15 1106 04/04/15 1620 04/04/15 2131 04/05/15 0559 04/05/15 1126  GLUCAP 139* 94 153* 93 151*    No results found for this or any previous visit (from the past 240 hour(s)).   Studies: No results found.  Scheduled Meds: . calcitRIOL  0.25 mcg Oral Q M,W,F  . calcium acetate  1,334 mg Oral TID WC  . ferrous sulfate  325 mg Oral Q breakfast  . furosemide  120 mg Intravenous BID  . gabapentin  300 mg Oral  QHS  . heparin  5,000 Units Subcutaneous 3 times per day  . hydrALAZINE  100 mg Oral TID  . hydrocerin   Topical BID  . insulin aspart  0-9 Units Subcutaneous TID WC  . insulin detemir  12 Units Subcutaneous QHS  . isosorbide mononitrate  60 mg Oral Daily  . metoprolol tartrate  100 mg Oral BID WC  . sodium chloride  3 mL Intravenous Q12H   Continuous Infusions:   Principal Problem:   CHF (congestive heart failure) Active Problems:   Peripheral edema   HTN (hypertension)  DM (diabetes mellitus)   Acute renal failure   Anemia   Hypokalemia   Congestive heart disease   Hypoxia   Acute on chronic diastolic CHF (congestive heart failure), NYHA class 4   Multiple myeloma    Time spent: 35 min    Kholton Coate  [facility] Pager [phone]. If 7PM-7AM, please contact night-coverage at [email or web address], password [facility] 04/05/2015, 4:54 PM  LOS: 14 days

## 2015-04-05 NOTE — Progress Notes (Addendum)
Admit: 03/22/2015 LOS: 30  59M with hx/o DM2 and HTN admitted with nephrotic proteinuria, AKI, positive M-Spike on SPEP and K:L sFLC 3.78.  Found to have LVH and dCHF.  Renal Bx with collapsing FSGS explaining proteinuric rapid progression of CKD  Subjective:  Tunneled HD catheter yesterday VVS eval For HD #1 today No co, feels well Questions about HD answered  04/29 0701 - 04/30 0700 In: 1180 [P.O.:1180] Out: 1400 [Urine:1400]  Filed Weights   04/03/15 0533 04/04/15 0510 04/05/15 0507  Weight: 108.274 kg (238 lb 11.2 oz) 107.321 kg (236 lb 9.6 oz) 106.686 kg (235 lb 3.2 oz)    Scheduled Meds: . calcitRIOL  0.25 mcg Oral Q M,W,F  . calcium acetate  1,334 mg Oral TID WC  .  ceFAZolin (ANCEF) IV  2 g Intravenous On Call  . ferrous sulfate  325 mg Oral Q breakfast  . gabapentin  300 mg Oral QHS  . heparin  5,000 Units Subcutaneous 3 times per day  . hydrALAZINE  100 mg Oral TID  . hydrocerin   Topical BID  . insulin aspart  0-9 Units Subcutaneous TID WC  . insulin detemir  12 Units Subcutaneous QHS  . isosorbide mononitrate  60 mg Oral Daily  . metoprolol tartrate  100 mg Oral BID WC  . sodium chloride  3 mL Intravenous Q12H   Continuous Infusions:  PRN Meds:.sodium chloride, sodium chloride, sodium chloride, acetaminophen, feeding supplement (NEPRO CARB STEADY), heparin, heparin, hydrALAZINE, lidocaine (PF), lidocaine-prilocaine, ondansetron (ZOFRAN) IV, pentafluoroprop-tetrafluoroeth, sodium chloride  Current Labs: reviewed    Physical Exam:  Blood pressure 145/79, pulse 64, temperature 97.9 F (36.6 C), temperature source Oral, resp. rate 18, height 5' 5"  (1.651 m), weight 106.686 kg (235 lb 3.2 oz), SpO2 97 %. NAD,  RRR, no rub CTAB 2+ LEE, pitting No rashes/lesions  A/P 1. AKI + Nephrotic Syndrome + M-spike on SPEP and sFLC K:L 3.78 1. Renal Bx 4/27 with collapsing FSGS 1. Prelim report with no evidence of monoclonal process 2. Collapsing FSGS is completely  explanatory of renal course 2. ANCA, ASO, HIV, GBM Ab, HCV, HBV, ANA, dsDNA negative. C3 and &C4 WNL.   3. BM Bx 4/27, results? 4. Renal Bx 4/27, prelim expected today 5. Pt is now ESRD. Plan to start HD today, CLIP for outpt HD 6. Cont IV diuresis to assist with volume remval 2. Possible Multiple Myeloma s/p BM Bx 4/27 3. HTN: stable, ARB held 4. DM2: unclear history 5. Hyperphosphatemia: cont PhosLo 2qAC 6. Anemia: stable post Bx. Monitor while BM Bx results pending  Pearson Grippe MD 04/05/2015, 10:46 AM   Recent Labs Lab 04/03/15 0410 04/04/15 0558 04/05/15 0555  NA 133* 133* 134*  K 4.8 5.0 5.4*  CL 92* 92* 93*  CO2 33* 30 31  GLUCOSE 164* 103* 97  BUN 55* 59* 63*  CREATININE 6.44* 6.92* 6.91*  CALCIUM 7.8* 8.2* 8.8  PHOS 7.6* 7.7* 7.6*    Recent Labs Lab 04/02/15 0520 04/03/15 0850 04/04/15 0558 04/05/15 0555  WBC 5.5 6.1 5.8 6.5  NEUTROABS 2.9  --   --   --   HGB 7.4* 7.3* 7.0* 7.3*  HCT 23.4* 23.0* 22.0* 23.6*  MCV 88.0 88.1 87.6 87.7  PLT 292 309 294 326

## 2015-04-06 DIAGNOSIS — D472 Monoclonal gammopathy: Secondary | ICD-10-CM

## 2015-04-06 DIAGNOSIS — E119 Type 2 diabetes mellitus without complications: Secondary | ICD-10-CM

## 2015-04-06 LAB — RENAL FUNCTION PANEL
ANION GAP: 9 (ref 5–15)
ANION GAP: 9 (ref 5–15)
Albumin: 1.9 g/dL — ABNORMAL LOW (ref 3.5–5.0)
Albumin: 1.9 g/dL — ABNORMAL LOW (ref 3.5–5.0)
BUN: 48 mg/dL — AB (ref 6–20)
BUN: 49 mg/dL — ABNORMAL HIGH (ref 6–20)
CALCIUM: 8.2 mg/dL — AB (ref 8.9–10.3)
CALCIUM: 8.1 mg/dL — AB (ref 8.9–10.3)
CO2: 29 mmol/L (ref 22–32)
CO2: 29 mmol/L (ref 22–32)
CREATININE: 6.03 mg/dL — AB (ref 0.61–1.24)
Chloride: 95 mmol/L — ABNORMAL LOW (ref 101–111)
Chloride: 95 mmol/L — ABNORMAL LOW (ref 101–111)
Creatinine, Ser: 6.21 mg/dL — ABNORMAL HIGH (ref 0.61–1.24)
GFR calc Af Amer: 11 mL/min — ABNORMAL LOW (ref >60)
GFR calc non Af Amer: 9 mL/min — ABNORMAL LOW (ref >60)
GFR, EST AFRICAN AMERICAN: 10 mL/min — AB (ref >60)
GFR, EST NON AFRICAN AMERICAN: 9 mL/min — AB (ref >60)
GLUCOSE: 152 mg/dL — AB (ref 70–99)
Glucose, Bld: 162 mg/dL — ABNORMAL HIGH (ref 70–99)
PHOSPHORUS: 6.2 mg/dL — AB (ref 2.5–4.6)
Phosphorus: 6.4 mg/dL — ABNORMAL HIGH (ref 2.5–4.6)
Potassium: 4.6 mmol/L (ref 3.5–5.1)
Potassium: 4.6 mmol/L (ref 3.5–5.1)
Sodium: 133 mmol/L — ABNORMAL LOW (ref 135–145)
Sodium: 133 mmol/L — ABNORMAL LOW (ref 135–145)

## 2015-04-06 LAB — GLUCOSE, CAPILLARY
GLUCOSE-CAPILLARY: 148 mg/dL — AB (ref 70–99)
GLUCOSE-CAPILLARY: 165 mg/dL — AB (ref 70–99)
Glucose-Capillary: 151 mg/dL — ABNORMAL HIGH (ref 70–99)
Glucose-Capillary: 123 mg/dL — ABNORMAL HIGH (ref 70–99)

## 2015-04-06 LAB — CBC
HEMATOCRIT: 21.4 % — AB (ref 39.0–52.0)
HEMOGLOBIN: 6.8 g/dL — AB (ref 13.0–17.0)
MCH: 28.2 pg (ref 26.0–34.0)
MCHC: 31.8 g/dL (ref 30.0–36.0)
MCV: 88.8 fL (ref 78.0–100.0)
Platelets: 291 10*3/uL (ref 150–400)
RBC: 2.41 MIL/uL — AB (ref 4.22–5.81)
RDW: 15 % (ref 11.5–15.5)
WBC: 7.5 10*3/uL (ref 4.0–10.5)

## 2015-04-06 LAB — PREPARE RBC (CROSSMATCH)

## 2015-04-06 MED ORDER — SODIUM CHLORIDE 0.9 % IV SOLN
510.0000 mg | Freq: Once | INTRAVENOUS | Status: DC
  Filled 2015-04-06: qty 17

## 2015-04-06 MED ORDER — SENNOSIDES-DOCUSATE SODIUM 8.6-50 MG PO TABS
2.0000 | ORAL_TABLET | Freq: Two times a day (BID) | ORAL | Status: DC
  Administered 2015-04-06 – 2015-04-11 (×7): 2 via ORAL
  Filled 2015-04-06 (×13): qty 2

## 2015-04-06 MED ORDER — HEPARIN SODIUM (PORCINE) 1000 UNIT/ML DIALYSIS: 20.0000 [IU]/kg | INTRAMUSCULAR | Status: DC | PRN

## 2015-04-06 MED ORDER — SODIUM CHLORIDE 0.9 % IV SOLN: Freq: Once | INTRAVENOUS | Status: DC

## 2015-04-06 NOTE — Progress Notes (Signed)
Still no vein mapping despite ordering on 4/29.  Will follow up on pt when vein map results available with access plan  Ruta Hinds, MD [facility] [place]: [phone] Pager: [phone]

## 2015-04-06 NOTE — Progress Notes (Addendum)
[facility] PROGRESS NOTE  James [name] GLO:[id number] DOB: [birth year] DOA: 03/22/2015 PCP: No primary care provider on file.  Brief Summary  59yo who presented with scrotal swelling and generalized edema. The patient was found to have elevated creatinine and evidence of heart failure.   Cardiology was consulted and started IV lasix.  Records demonstrated he had sharp rise in creatinine over the last few months.  Nephrology was consulted.  On his SPEP he had an M spike. UPEP is completing urine collection today. It is possible that his acute kidney injury is secondary to multiple myeloma as opposed to hypertension and diabetes. Nephrology recommended pursuing bone marrow biopsy instead of kidney biopsy showed his confirmatory test such as IFE,/lambda light chain, beta-2 microglobulin return positive.  Bone survery was negative.  Discordant protein on urinalysis compared to total protein collected from 24-hour urine collection suspicious for multiple myeloma.  Bone marrow and kidney biopsy on Wed.  Assessment/Plan  Acute hypoxic respiratory failure secondary to acute on chronic systolic and diastolic congestive heart failure, on room air, swelling improving -    2-D echo with ejection fraction 45-50% with LV diastolic dysfunction -   TSH within normal limits -   Appreciate cardiology assistance, cardiology following prn, last note on 4/25 -   Started HD on 4/30, Lasix per nephrology   Acute kidney injury,  -  Avoid ACE/ARB --  Renal biopsy 4/27, collapsing FSGS -rIJ HD cath placed on 4/29,  -HD on 4/30, management per  nephrology  Probable multiple myeloma  - Beta-2 microglobulin 8.3, cryoglobulin pending, upep IFE igGkappa, spep mspike 1.8%, SFL kappa 113 k/l ratio 3.7 -  IgG 2678, IgA 102, IgM <6 -   Bone survey negative -  Bone marrow biopsy 4/27:  11% plasma cell /keppa restricted, cytogenetics pending -  Formal Oncology consult called on 4/30, Dr Myna Hidalgo will see  patient   Diabetes mellitus type 2 with renal complications, CBGs at goal -   Continue Levemir 12 units with sliding scale insulin  Hypertension, blood pressure well controlled -   Continue Metoprolol to 100 mg twice a day./hydralazine to 100 mg TID/ Imdur -    Added norvasc on 4/25, norvasc d/ced on 4/28, currently bp low normal, persistent lower extremity edema -   ACEI held due to AKI  Iron deficiency anemia and probable multiple myeloma -  Folate/ b12/tsh wnl, epo level pending, FOBT pending, does has iron deficiency, Started iron supplementation -  Avoid epo for now while working up MM - hgb dropped below 7 on 5/1, 1unit prbc given.   Secondary hyperparathyroidism -  Started on calcitriol  Diet:   2 g sodium Access:   PIV IVF:   off Proph:   Heparin held for 48hrs post biopsy. Start scds, heparin subQ restarted on 4/30  Code Status:  full Family Communication:  Patient alone Disposition Plan:  Remain inpatient    Consultants:   cardiology    nephrology   IR for bone marrow biopsy/renal biopsy  Oncology   Procedures:   echo  Bone marrow biopsy/renal biopsy 4/27  HD from 4/30  Antibiotics:   none   HPI/Subjective:  Reported less scrotal edema, persistent lower extremity edema,  Denies pain, reported tolerated dialysis.  Objective: Filed Vitals:   04/05/15 1929 04/05/15 2000 04/06/15 0443 04/06/15 0821  BP: 151/78 148/77 145/77 148/75  Pulse: 65 67 70 68  Temp: 98.2 F (36.8 C) 98.3 F (36.8 C) 98.3 F (36.8 C)   TempSrc: Oral  Oral Oral   Resp: 18 18 18    Height:      Weight: 106.6 kg (235 lb 0.2 oz)  105.915 kg (233 lb 8 oz)   SpO2:  98% 98%     Intake/Output Summary (Last 24 hours) at 04/06/15 1338 Last data filed at 04/06/15 1000  Gross per 24 hour  Intake    944 ml  Output   3300 ml  Net  -2356 ml   Filed Weights   04/05/15 1715 04/05/15 1929 04/06/15 0443  Weight: 109.9 kg (242 lb 4.6 oz) 106.6 kg (235 lb 0.2 oz) 105.915 kg (233  lb 8 oz)    Exam:   General:   Obese male, no acute distress, sitting at edge of bed  HEENT:  NCAT, MMM  Cardiovascular:  RRR, nl S1, S2 no mrg, 2+ pulses, warm extremities  Respiratory:  CTAB, no increased WOB  MSK:   Normal tone and bulk, 1+ slow pitting bilateral LEE, ankles are visible. resolving Hands and eyelids edema   GU: deferred today  Data Reviewed: Basic Metabolic Panel:  Recent Labs Lab 04/02/15 0520 04/03/15 0410 04/04/15 0558 04/05/15 0555 04/06/15 0552  NA 131* 133* 133* 134* 133*  K 4.7 4.8 5.0 5.4* 4.6  CL 92* 92* 92* 93* 95*  CO2 31 33* 30 31 29   GLUCOSE 144* 164* 103* 97 152*  BUN 54* 55* 59* 63* 48*  CREATININE 6.04* 6.44* 6.92* 6.91* 6.03*  CALCIUM 7.9* 7.8* 8.2* 8.8 8.2*  PHOS 7.1* 7.6* 7.7* 7.6* 6.2*   Liver Function Tests:  Recent Labs Lab 04/02/15 0520 04/03/15 0410 04/04/15 0558 04/05/15 0555 04/06/15 0552  ALBUMIN 1.9* 1.8* 2.0* 2.0* 1.9*   No results for input(s): LIPASE, AMYLASE in the last 168 hours. No results for input(s): AMMONIA in the last 168 hours. CBC:  Recent Labs Lab 04/02/15 0520 04/03/15 0850 04/04/15 0558 04/05/15 0555 04/05/15 2117  WBC 5.5 6.1 5.8 6.5 6.9  NEUTROABS 2.9  --   --   --   --   HGB 7.4* 7.3* 7.0* 7.3* 7.3*  HCT 23.4* 23.0* 22.0* 23.6* 23.0*  MCV 88.0 88.1 87.6 87.7 87.8  PLT 292 309 294 326 299   Cardiac Enzymes: No results for input(s): CKTOTAL, CKMB, CKMBINDEX, TROPONINI in the last 168 hours. BNP (last 3 results)  Recent Labs  03/22/15 1903  BNP 1043.8*    ProBNP (last 3 results) No results for input(s): PROBNP in the last 8760 hours.  CBG:  Recent Labs Lab 04/05/15 1126 04/05/15 1643 04/05/15 2119 04/06/15 0552 04/06/15 1141  GLUCAP 151* 122* 173* 151* 123*    No results found for this or any previous visit (from the past 240 hour(s)).   Studies: No results found.  Scheduled Meds: . calcitRIOL  0.25 mcg Oral Q M,W,F  . calcium acetate  1,334 mg Oral TID WC   . ferrous sulfate  325 mg Oral Q breakfast  . furosemide  120 mg Intravenous BID  . gabapentin  300 mg Oral QHS  . heparin  5,000 Units Subcutaneous 3 times per day  . hydrALAZINE  100 mg Oral TID  . hydrocerin   Topical BID  . insulin aspart  0-9 Units Subcutaneous TID WC  . insulin detemir  12 Units Subcutaneous QHS  . isosorbide mononitrate  60 mg Oral Daily  . metoprolol tartrate  100 mg Oral BID WC  . sodium chloride  3 mL Intravenous Q12H   Continuous Infusions:   Principal Problem:  CHF (congestive heart failure) Active Problems:   Peripheral edema   HTN (hypertension)   DM (diabetes mellitus)   Acute renal failure   Anemia   Hypokalemia   Congestive heart disease   Hypoxia   Acute on chronic diastolic CHF (congestive heart failure), NYHA class 4   Multiple myeloma    Time spent: 25 min    Danica Camarena  [facility] Pager [phone]. If 7PM-7AM, please contact night-coverage at [email or web address], password [facility] 04/06/2015, 1:38 PM  LOS: 15 days

## 2015-04-06 NOTE — Progress Notes (Addendum)
Admit: 03/22/2015 LOS: 15  17M with hx/o DM2 and HTN admitted with nephrotic proteinuria, AKI, positive M-Spike on SPEP and K:L sFLC 3.78.  Found to have LVH and dCHF.  Renal Bx with collapsing FSGS explaining proteinuric rapid progression of CKD.  Bone marrow biopsy with 11% plasma cells consistent with IgG Kappa multiple myeloma  Subjective:  HD #1 yesterday, 2 hours, BFR 250,  1 L UF , via tunneled HD catheter   no complaints this morning Continues to make excellent urine, on furosemide  04/30 0701 - 05/01 0700 In: 0865 [P.O.:1180; IV Piggyback:62] Out: 2700 [Urine:1700]  Filed Weights   04/05/15 1715 04/05/15 1929 04/06/15 0443  Weight: 109.9 kg (242 lb 4.6 oz) 106.6 kg (235 lb 0.2 oz) 105.915 kg (233 lb 8 oz)    Scheduled Meds: . calcitRIOL  0.25 mcg Oral Q M,W,F  . calcium acetate  1,334 mg Oral TID WC  . ferrous sulfate  325 mg Oral Q breakfast  . furosemide  120 mg Intravenous BID  . gabapentin  300 mg Oral QHS  . heparin  5,000 Units Subcutaneous 3 times per day  . hydrALAZINE  100 mg Oral TID  . hydrocerin   Topical BID  . insulin aspart  0-9 Units Subcutaneous TID WC  . insulin detemir  12 Units Subcutaneous QHS  . isosorbide mononitrate  60 mg Oral Daily  . metoprolol tartrate  100 mg Oral BID WC  . sodium chloride  3 mL Intravenous Q12H   Continuous Infusions:  PRN Meds:.sodium chloride, acetaminophen, hydrALAZINE, ondansetron (ZOFRAN) IV, sodium chloride  Current Labs: reviewed    Physical Exam:  Blood pressure 148/75, pulse 68, temperature 98.3 F (36.8 C), temperature source Oral, resp. rate 18, height _0  (1.651 m), weight 105.915 kg (233 lb 8 oz), SpO2 98 %. NAD,  RRR, no rub CTAB 2+ LEE, pitting No rashes/lesions  A/P 1. AKI + Nephrotic Syndrome + M-spike on SPEP and sFLC K:L 3.78 1. Renal Bx 4/27 with collapsing FSGS 1. Prelim report with no evidence of monoclonal process 2. Collapsing FSGS is completely explanatory of renal course 2. ANCA,  ASO, HIV, GBM Ab, HCV, HBV, ANA, dsDNA negative. C3 and &C4 WNL.   3. Renal Bx 4/27 4. Pt is now ESRD. HD start 04/05/15, CLIP for outpt HD 5. VVS following for permanent access 6. Cont IV diuresis to assist with volume remval 7. HD #2 5/2 2. Multiple Myeloma s/p BM Bx 4/27 with 11% plasma cells., Oncology to see,    3. HTN: stable, ARB held 4. DM2: unclear history 5. Hyperphosphatemia: cont PhosLo 2qAC 6. Anemia: In presence of myeloma, we'll ask hematology to provide guidance and anemia management. 7.  Pearson Grippe MD 04/06/2015, 9:45 AM   Recent Labs Lab 04/04/15 0558 04/05/15 0555 04/06/15 0552  NA 133* 134* 133*  K 5.0 5.4* 4.6  CL 92* 93* 95*  CO2 _1 GLUCOSE 103* 97 152*  BUN 59* 63* 48*  CREATININE 6.92* 6.91* 6.03*  CALCIUM 8.2* 8.8 8.2*  PHOS 7.7* 7.6* 6.2*    Recent Labs Lab 04/02/15 0520  04/04/15 0558 04/05/15 0555 04/05/15 2117  WBC 5.5  < > 5.8 6.5 6.9  NEUTROABS 2.9  --   --   --   --   HGB 7.4*  < > 7.0* 7.3* 7.3*  HCT 23.4*  < > 22.0* 23.6* 23.0*  MCV 88.0  < > 87.6 87.7 87.8  PLT 292  < > 294 326  299  < > = values in this interval not displayed.

## 2015-04-06 NOTE — Progress Notes (Signed)
VASCULAR LAB PRELIMINARY  PRELIMINARY  PRELIMINARY  PRELIMINARY  Right  Upper Extremity Vein Map    Cephalic  Segment Diameter Depth Comment  1. Axilla 6.31 mm mm   2. Mid upper arm 6.63 mm mm   3. Above AC  7.05 mm mm   4. In AC 7.98 mm mm   5. Below AC 5.46 mm mm following middle branch  6. Mid forearm 4.35 mm mm   7. Wrist 4.14 mm mm    mm mm    mm mm    mm mm    Basilic  Segment Diameter Depth Comment  1. Axilla mm mm   2. Mid upper arm 6.27 mm 12.3 mm   3. Above AC 5.62 mm 8.06 mm  small branch  4. In AC 4.88 mm 4.38 mm Branch   5. Below AC 4.08 mm 2.92 mm Following middle branch  6. Mid forearm 2.17 mm mm Multiple branches.  Following middle branch  7. Wrist 1.39mm mm    mm mm    mm mm    mm mm    Left Upper Extremity Vein Map    Cephalic  Segment Diameter Depth Comment  1. Axilla 6.89 mm mm   2. Mid upper arm 5.99 mm mm   3. Above AC 7 mm mm   4. In AC 8.45 mm mm   5. Below AC 4.49 mm mm Following middle branch  6. Mid forearm 4.24 mm mm   7. Wrist 2.97 mm mm   IV proximal to wrist     mm mm    mm mm    mm mm    Basilic  Segment Diameter Depth Comment  1. Axilla mm mm   2. Mid upper arm 7.41 mm 12.2 mm   3. Above AC 4.69 mm 5.77 mm Multiple branches  Following middle branch  4. In AC 4.32 mm mm   5. Below AC 3.34 mm mm   6. Mid forearm 2.86 mm mm Branch   7. Wrist 2.98 mm mm branch   mm mm    mm mm    mm mm     Landry Mellow, RVT 04/06/2015, 4:42 PM

## 2015-04-06 NOTE — Consult Note (Signed)
Referral MD  Reason for Referral: Monoclonal gammopathy-renal failure   Chief Complaint  Patient presents with  . Groin Swelling  : My kidneys are not working.  HPI: Mr. James [name] is a 59 year old African-American gentleman. He has a history of hypertension and diabetes. He had not working. He had no problems working. He really had no issues healthwise. It didn't seem like the diabetes with much of a problem for him. I suppose he was on antihypertensives for the high blood pressure.  He started to gain weight. Started to increase the size of his legs.  He was admitted. He is not to be in renal failure. His BUN was 27 creatinine 4.54. His creatinine has gotten steadily worse.  He did have a kidney biopsy. The hospital and that this was consistent with FSGS.Marland Kitchen  For some reason he had a bone marrow biopsy. The pathology report ([id number]) showed 11% cells. The plasma cells had atypical features. The plasma cells were Kappa light chain restricted.  He's had lab studies done. He had an M spike of 1.8 g/dL. His IgG level was 267 8 mg/dL. He had a normal IgA level but a markedly decreased IgM level. He had a Light chain in the serum of 113 mg/dL.  He had a 24-hour urine done which showed over 5700 mg of protein. I do not know how much Kappa light chain was in the urine. This may all be albumin area and however, he was found have a IgG Kappa component to the proteinuria.  He had a bone survey done which was negative.   Iron studies were done which showed a ferritin 306 but iron saturation of only 11%. He now is on oral iron. He is quite anemic. He has a minimal reticulocyte count. I'm sure that his erythropoietin level is very low.  We are asked to see him to decide whether or not he actually has myeloma.    Past Medical History  Diagnosis Date  . Hypertension   . Diabetes mellitus without complication   :  Past Surgical History  Procedure Laterality Date  . Colonoscopy    :   Current  facility-administered medications:  .  0.9 %  sodium chloride infusion, 250 mL, Intravenous, PRN, Caren Griffins, MD, Last Rate: 10 mL/hr at 03/23/15 1503, 250 mL at 03/23/15 1503 .  acetaminophen (TYLENOL) tablet 650 mg, 650 mg, Oral, Q4H PRN, Caren Griffins, MD .  calcitRIOL (ROCALTROL) capsule 0.25 mcg, 0.25 mcg, Oral, Q M,W,F, Donato Heinz, MD, 0.25 mcg at 04/04/15 1042 .  calcium acetate (PHOSLO) capsule 1,334 mg, 1,334 mg, Oral, TID WC, Florencia Reasons, MD, 1,334 mg at 04/06/15 1333 .  ferrous sulfate tablet 325 mg, 325 mg, Oral, Q breakfast, Janece Canterbury, MD, 325 mg at 04/06/15 0820 .  furosemide (LASIX) 120 mg in dextrose 5 % 50 mL IVPB, 120 mg, Intravenous, BID, Rexene Agent, MD, 120 mg at 04/06/15 0820 .  gabapentin (NEURONTIN) capsule 300 mg, 300 mg, Oral, QHS, Donato Heinz, MD, 300 mg at 04/05/15 2132 .  heparin injection 2,100 Units, 20 Units/kg, Dialysis, PRN, Rexene Agent, MD .  heparin injection 5,000 Units, 5,000 Units, Subcutaneous, 3 times per day, Monia Sabal, PA-C, 5,000 Units at 04/06/15 1336 .  hydrALAZINE (APRESOLINE) injection 10 mg, 10 mg, Intravenous, Q4H PRN, Caren Griffins, MD, 10 mg at 03/23/15 1055 .  hydrALAZINE (APRESOLINE) tablet 100 mg, 100 mg, Oral, TID, Janece Canterbury, MD, 100 mg at 04/06/15 4210 .  hydrocerin (EUCERIN)  cream, , Topical, BID, Janece Canterbury, MD .  insulin aspart (novoLOG) injection 0-9 Units, 0-9 Units, Subcutaneous, TID WC, Janece Canterbury, MD, 1 Units at 04/06/15 1335 .  insulin detemir (LEVEMIR) injection 12 Units, 12 Units, Subcutaneous, QHS, Donne Hazel, MD, 12 Units at 04/05/15 2132 .  isosorbide mononitrate (IMDUR) 24 hr tablet 60 mg, 60 mg, Oral, Daily, Janece Canterbury, MD, 60 mg at 04/06/15 0953 .  metoprolol (LOPRESSOR) tablet 100 mg, 100 mg, Oral, BID WC, Janece Canterbury, MD, 100 mg at 04/06/15 5462 .  ondansetron (ZOFRAN) injection 4 mg, 4 mg, Intravenous, Q6H PRN, Costin Karlyne Greenspan, MD .  sodium chloride 0.9 %  injection 3 mL, 3 mL, Intravenous, Q12H, Caren Griffins, MD, 3 mL at 04/06/15 0954 .  sodium chloride 0.9 % injection 3 mL, 3 mL, Intravenous, PRN, Caren Griffins, MD:  . calcitRIOL  0.25 mcg Oral Q M,W,F  . calcium acetate  1,334 mg Oral TID WC  . ferrous sulfate  325 mg Oral Q breakfast  . furosemide  120 mg Intravenous BID  . gabapentin  300 mg Oral QHS  . heparin  5,000 Units Subcutaneous 3 times per day  . hydrALAZINE  100 mg Oral TID  . hydrocerin   Topical BID  . insulin aspart  0-9 Units Subcutaneous TID WC  . insulin detemir  12 Units Subcutaneous QHS  . isosorbide mononitrate  60 mg Oral Daily  . metoprolol tartrate  100 mg Oral BID WC  . sodium chloride  3 mL Intravenous Q12H  :  No Known Allergies:  History reviewed. No pertinent family history.:  History   Social History  . Marital Status: Single    Spouse Name: N/A  . Number of Children: N/A  . Years of Education: N/A   Occupational History  . Not on file.   Social History Main Topics  . Smoking status: Never Smoker   . Smokeless tobacco: Not on file  . Alcohol Use: No  . Drug Use: No  . Sexual Activity: Not on file   Other Topics Concern  . Not on file   Social History Narrative  :  Pertinent items are noted in HPI.  Exam: Patient Vitals for the past 24 hrs:  BP Temp Temp src Pulse Resp SpO2 Weight  04/06/15 1430 116/60 mmHg 98.3 F (36.8 C) Oral 71 18 99 % -  04/06/15 0821 (!) 148/75 mmHg - - 68 - - -  04/06/15 0443 (!) 145/77 mmHg 98.3 F (36.8 C) Oral 70 18 98 % 233 lb 8 oz (105.915 kg)  04/05/15 2000 (!) 148/77 mmHg 98.3 F (36.8 C) Oral 67 18 98 % -  04/05/15 1929 (!) 151/78 mmHg 98.2 F (36.8 C) Oral 65 18 - 235 lb 0.2 oz (106.6 kg)  04/05/15 1900 (!) 143/74 mmHg - - 65 16 - -  04/05/15 1830 122/63 mmHg - - 61 16 - -  04/05/15 1800 (!) 143/80 mmHg - - 63 16 - -  04/05/15 1730 (!) 142/73 mmHg - - 63 18 - -  04/05/15 1717 (!) 151/83 mmHg - - 65 16 - -  04/05/15 1715 (!) 146/82  mmHg 98.1 F (36.7 C) Oral 65 16 98 % 242 lb 4.6 oz (109.9 kg)    as above    Recent Labs  04/05/15 2117 04/06/15 1457  WBC 6.9 7.5  HGB 7.3* 6.8*  HCT 23.0* 21.4*  PLT 299 291    Recent Labs  04/06/15 0552 04/06/15  1457  NA 133* 133*  K 4.6 4.6  CL 95* 95*  CO2 29 29  GLUCOSE 152* 162*  BUN 48* 49*  CREATININE 6.03* 6.21*  CALCIUM 8.2* 8.1*    Blood smear review: None  Pathology: None     Assessment and Plan: Mr. Fails is a 59 year old gentleman with renal failure. He has a history of hypertension and diabetes.  I'm not all that impressed with his myeloma studies. It is not like he has a large M spike. It will be nice to see how much light chain is in his urine.  I realize that bone marrow biopsy can be patchy with myeloma involvement.  I suspect that his anemia is more related to erythropoietin deficiency. I am sure that this was checked or his being checked.  I am "on the fence" as far as treating him for myeloma. I think that if his bone survey was positive, then I would definitely embark upon therapy.  We will have to see what the renal biopsy shows.  I will have to wait the light chain analysis of his urine.  He is a really nice guy. I thought that he was a same Mackie Holness that played in a couple [employer]!!!! Obviously he is not related!!! He laughed when I asked him that!!  Pete E.

## 2015-04-07 DIAGNOSIS — Z992 Dependence on renal dialysis: Secondary | ICD-10-CM

## 2015-04-07 DIAGNOSIS — N186 End stage renal disease: Secondary | ICD-10-CM

## 2015-04-07 LAB — RENAL FUNCTION PANEL
Albumin: 1.9 g/dL — ABNORMAL LOW (ref 3.5–5.0)
Anion gap: 10 (ref 5–15)
BUN: 52 mg/dL — AB (ref 6–20)
CALCIUM: 8.2 mg/dL — AB (ref 8.9–10.3)
CHLORIDE: 95 mmol/L — AB (ref 101–111)
CO2: 28 mmol/L (ref 22–32)
Creatinine, Ser: 6.01 mg/dL — ABNORMAL HIGH (ref 0.61–1.24)
GFR calc Af Amer: 11 mL/min — ABNORMAL LOW (ref >60)
GFR, EST NON AFRICAN AMERICAN: 9 mL/min — AB (ref >60)
Glucose, Bld: 157 mg/dL — ABNORMAL HIGH (ref 70–99)
PHOSPHORUS: 6.4 mg/dL — AB (ref 2.5–4.6)
Potassium: 4.8 mmol/L (ref 3.5–5.1)
Sodium: 133 mmol/L — ABNORMAL LOW (ref 135–145)

## 2015-04-07 LAB — GLUCOSE, CAPILLARY
GLUCOSE-CAPILLARY: 118 mg/dL — AB (ref 70–99)
Glucose-Capillary: 129 mg/dL — ABNORMAL HIGH (ref 70–99)
Glucose-Capillary: 93 mg/dL (ref 70–99)
Glucose-Capillary: 139 mg/dL — ABNORMAL HIGH (ref 70–99)

## 2015-04-07 LAB — ERYTHROPOIETIN: Erythropoietin: 28.5 m[IU]/mL — ABNORMAL HIGH (ref 2.6–18.5)

## 2015-04-07 LAB — CBC
HEMATOCRIT: 23.5 % — AB (ref 39.0–52.0)
Hemoglobin: 7.5 g/dL — ABNORMAL LOW (ref 13.0–17.0)
MCH: 28.3 pg (ref 26.0–34.0)
MCHC: 31.9 g/dL (ref 30.0–36.0)
MCV: 88.7 fL (ref 78.0–100.0)
PLATELETS: 269 10*3/uL (ref 150–400)
RBC: 2.65 MIL/uL — AB (ref 4.22–5.81)
RDW: 14.9 % (ref 11.5–15.5)
WBC: 7 10*3/uL (ref 4.0–10.5)

## 2015-04-07 LAB — TYPE AND SCREEN
ABO/RH(D): O NEG
ANTIBODY SCREEN: NEGATIVE
UNIT DIVISION: 0

## 2015-04-07 LAB — HEPATITIS B SURFACE ANTIBODY,QUALITATIVE: Hep B S Ab: NEGATIVE

## 2015-04-07 LAB — HEPATITIS B SURFACE ANTIGEN: Hepatitis B Surface Ag: NEGATIVE

## 2015-04-07 LAB — HEPATITIS B CORE ANTIBODY, TOTAL: HEP B C TOTAL AB: NONREACTIVE

## 2015-04-07 LAB — CRYOGLOBULIN

## 2015-04-07 LAB — ABO/RH: ABO/RH(D): O NEG

## 2015-04-07 MED ORDER — DARBEPOETIN ALFA 100 MCG/0.5ML IJ SOSY
100.0000 ug | PREFILLED_SYRINGE | INTRAMUSCULAR | Status: DC
  Filled 2015-04-07 (×2): qty 0.5

## 2015-04-07 MED ORDER — METOPROLOL TARTRATE 50 MG PO TABS
50.0000 mg | ORAL_TABLET | Freq: Two times a day (BID) | ORAL | Status: DC
  Administered 2015-04-07 – 2015-04-12 (×10): 50 mg via ORAL
  Filled 2015-04-07 (×12): qty 1

## 2015-04-07 MED ORDER — FUROSEMIDE 80 MG PO TABS
120.0000 mg | ORAL_TABLET | Freq: Two times a day (BID) | ORAL | Status: DC
  Administered 2015-04-07 – 2015-04-11 (×8): 120 mg via ORAL
  Filled 2015-04-07 (×14): qty 1

## 2015-04-07 MED ORDER — DEXTROSE 5 % IV SOLN
1.5000 g | INTRAVENOUS | Status: AC
  Administered 2015-04-08: 1.5 g via INTRAVENOUS
  Filled 2015-04-07 (×2): qty 1.5

## 2015-04-07 MED ORDER — HYDRALAZINE HCL 50 MG PO TABS
50.0000 mg | ORAL_TABLET | Freq: Two times a day (BID) | ORAL | Status: DC
  Administered 2015-04-07 – 2015-04-11 (×8): 50 mg via ORAL
  Filled 2015-04-07 (×10): qty 1

## 2015-04-07 NOTE — Progress Notes (Signed)
At 1530 introduced self to pt as incoming nurse.  Verbalized understanding.  Call bell at reach.  Will continue to monitor.  Karie Kirks, Therapist, sports.

## 2015-04-07 NOTE — Progress Notes (Addendum)
Subjective:  HD #2 today Seen in the dialysis unit No complaints this morning Continues to make urine, still on IV  furosemide  05/01 0701 - 05/02 0700 In: 1432.6 [P.O.:940; Blood:368.6; IV Piggyback:124] Out: 2725 [Urine:2725]  Filed Weights   04/06/15 0443 04/07/15 0642 04/07/15 0707  Weight: 105.915 kg (233 lb 8 oz) 105.96 kg (233 lb 9.6 oz) 106.2 kg (234 lb 2.1 oz)   Physical Exam:  Blood pressure 137/76, pulse 67, temperature 98.5 F (36.9 C), temperature source Oral, resp. rate 18, height 5' 5"  (1.651 m), weight 106.2 kg (234 lb 2.1 oz), SpO2 95 %. NAD,  S1S2 No S3 or rub Lungs clear 2+ pitting edema LE's TDC right IJ position in use  Scheduled Meds: . sodium chloride   Intravenous Once  . calcitRIOL  0.25 mcg Oral Q M,W,F  . calcium acetate  1,334 mg Oral TID WC  . ferumoxytol  510 mg Intravenous Once  . furosemide  120 mg Intravenous BID  . gabapentin  300 mg Oral QHS  . heparin  5,000 Units Subcutaneous 3 times per day  . hydrALAZINE  100 mg Oral TID  . hydrocerin   Topical BID  . insulin aspart  0-9 Units Subcutaneous TID WC  . insulin detemir  12 Units Subcutaneous QHS  . isosorbide mononitrate  60 mg Oral Daily  . metoprolol tartrate  100 mg Oral BID WC  . senna-docusate  2 tablet Oral BID  . sodium chloride  3 mL Intravenous Q12H   Continuous Infusions:  PRN Meds:.sodium chloride, acetaminophen, heparin, hydrALAZINE, ondansetron (ZOFRAN) IV, sodium chloride  Current Labs: reviewed   Recent Labs Lab 04/06/15 0552 04/06/15 1457 04/07/15 0406  NA 133* 133* 133*  K 4.6 4.6 4.8  CL 95* 95* 95*  CO2 29 29 28   GLUCOSE 152* 162* 157*  BUN 48* 49* 52*  CREATININE 6.03* 6.21* 6.01*  CALCIUM 8.2* 8.1* 8.2*  PHOS 6.2* 6.4* 6.4*    Recent Labs Lab 04/02/15 0520  04/05/15 2117 04/06/15 1457 04/07/15 0406  WBC 5.5  < > 6.9 7.5 7.0  NEUTROABS 2.9  --   --   --   --   HGB 7.4*  < > 7.3* 6.8* 7.5*  HCT 23.4*  < > 23.0* 21.4* 23.5*  MCV 88.0  < > 87.8  88.8 88.7  PLT 292  < > 299 291 269  < > = values in this interval not displayed.   Results for MALYK, GIROUARD (MRN [id number]) as of 04/07/2015 07:26  Ref. Range 03/26/2015 16:14  PTH Latest Ref Range: 15-65 pg/mL 118 (H)   Results for ZUHAIR, LARICCIA (MRN [id number]) as of 04/07/2015 07:26  Ref. Range 03/23/2015 03:30  Iron Latest Ref Range: 42-165 ug/dL 18 (L)  UIBC Latest Ref Range: 125-400 ug/dL 148  TIBC Latest Ref Range: 215-435 ug/dL 166 (L)  Saturation Ratios Latest Ref Range: 20-55 % 11 (L)  Ferritin Latest Ref Range: 22-322 ng/mL 306  Folate Latest Units: ng/mL 6.1   Background 89M with hx/o DM2 and HTN admitted with nephrotic proteinuria, AKI, positive M-Spike on SPEP and K:L sFLC 3.78.  Found to have LVH and dCHF.  Renal Bx with collapsing FSGS explaining proteinuric rapid progression of CKD.  Bone marrow biopsy with 11% plasma cells consistent with IgG Kappa multiple myeloma with 11% plasma cells  A/P 1. AKI + Nephrotic Syndrome + M-spike on SPEP and sFLC K:L 3.78. ANCA, ASO, HIV, GBM Ab, HCV, HBV, ANA, dsDNA negative. C3 and &C4  WNL.   1. Renal Bx 4/27 with collapsing FSGS 1. Prelim report with no evidence of monoclonal process 2. Collapsing FSGS is completely explanatory of renal course 3. PT NOW ESRD and will proceed with ACCESS, CLIP process accordingly. V map done, VVS aware of pt. Has tunnelled HD cath placed 04/04/15. Lives in [place] 2. Continue diuretics to help with volume, change to po, expect UOP will dwindle off 3. HD #2 5/2 (today), #3 tomorrow. 2. Multiple Myeloma s/p BM Bx 4/27 with 11% plasma cells., Oncology has seen and is "on the fence" with regard to treatment of MM. He does not have evidence for monoclonal process on his kidney biopsy.    3. HTN: stable, ARB held. Back off on BP beds to allow for better fluid removal with HD.  4. DM2: unclear history 5. Hyperphosphatemia: cont PhosLo 2qAC. PTH only 118. Getting oral calcitriol - will be below PTH goal of  150-300 at outpt HD. Stop.   6. Anemia: In presence of myeloma, we'll ask hematology to provide guidance and anemia management. Appears oncology feels more likel EPO deficient. Will start Aranesp. To get feraheme today for Fe deficiency component

## 2015-04-07 NOTE — Progress Notes (Signed)
Hemodialysis= Pt tolerated well. UF=2L as ordered without issue. Report given to primary RN.

## 2015-04-07 NOTE — Progress Notes (Signed)
PROGRESS NOTE    James [name] ONG:[id number] DOB: January 28, 1956 DOA: 03/22/2015 PCP: No primary care provider on file.  HPI/Brief narrative 59yo who presented with scrotal swelling and generalized edema. The patient was found to have elevated creatinine and evidence of heart failure.Cardiology was consulted and started IV lasix.Records demonstrated he had sharp rise in creatinine over the last few months.Nephrology was consulted.On his SPEP he had an M spike. It is possible that his acute kidney injury is secondary to multiple myeloma as opposed to hypertension and diabetes. Nephrology recommended pursuing bone marrow biopsy instead of kidney biopsy showed his confirmatory test such as IFE,/lambda light chain, beta-2 microglobulin return positive. Bone survery was negative. Discordant protein on urinalysis compared to total protein collected from 24-hour urine collection suspicious for multiple myeloma. Bone marrow and kidney biopsy on Wed.   Assessment/Plan:  Acute hypoxic respiratory failure secondary to acute on chronic systolic and diastolic congestive heart failure - 2-D echo with ejection fraction 45-50% with LV diastolic dysfunction - TSH within normal limits - Appreciate cardiology assistance, cardiology following prn, last note on 4/25 - Started HD on 4/30, Lasix per nephrology - Resolved  Acute kidney injury/nephrotic syndrome/M spike on SPEP/new ESRD - Nephrology follow-up appreciated - ANCA, ASO, HIV, GBM Ab, HCV, HBV, ANA, dsDNA negative. C3 and &C4 WNL.  - Renal biopsy 4/27 showed collapsing FSGS and no evidence of monoclonal process. - As per nephrology, patient now ESRD. Started hemodialysis 4/30 via a tunneled HD catheter placed on 4/29. Vein mapping done and VVS aware of patient regarding permanent access.  Probable multiple myeloma  - Beta-2 microglobulin 8.3, cryoglobulin pending, upep IFE igG kappa, spep mspike 1.8%, SFL kappa 113 k/l ratio 3.7 -  IgG 2678, IgA 102, IgM <6 - Bone survey negative - Bone marrow biopsy 4/27: 11% plasma cell /keppa restricted, cytogenetics pending - Oncology consultation appreciated and they are "on the fence" with regard to treatment of multiple myeloma. No evidence of monoclonal process on renal biopsy  Diabetes mellitus type 2 with renal complications, CBGs at goal -Continue Levemir 12 units with sliding scale insulin - Hemoglobin A1c: 7.2 - Good inpatient control.  Essential hypertension - Controlled on metoprolol, hydralazine and Imdur  -ARB held due to AKI  Anemia - Folate/ b12/tsh wnl, epo level pending, FOBT pending, does has iron deficiency, Started iron supplementation - Avoid epo for now while working up MM -  hgb dropped below 7 on 5/1, 1unit prbc given.  - As per oncology, anemia felt to be secondary to Epo deficiency and nephrology have started Aranesp.  - Patient to get Feraheme today  Secondary hyperparathyroidism - Started on calcitriol-discontinued by nephrology due to PTH only 118   Code Status: Full Family Communication: None at bedside Disposition Plan: Home when medically stable   Consultants:  Cardiology  Nephrology  IR for [facility] & Renal Biopsy  Medical Oncology  VVS  Procedures:  BM & Renal Bx on 4/27  HD form 4/30   Antibiotics:  None  Subjective: Patient was seen at hemodialysis this morning. Denied complaints. Denied pain issues or dyspnea.  Objective: Filed Vitals:   04/07/15 0923 04/07/15 0940 04/07/15 1011 04/07/15 1123  BP: 111/61 132/72 140/75 125/63  Pulse: 67 70 68   Temp:   98.3 F (36.8 C) 99 F (37.2 C)  TempSrc:   Oral Oral  Resp:   16 20  Height:      Weight:   104.1 kg (229 lb 8 oz)   SpO2:  99% 93%    Intake/Output Summary (Last 24 hours) at 04/07/15 1246 Last data filed at 04/07/15 1159  Gross per 24 hour  Intake 1490.58 ml  Output   4125 ml  Net -2634.42 ml   Filed Weights   04/07/15 0642 04/07/15  0707 04/07/15 1011  Weight: 105.96 kg (233 lb 9.6 oz) 106.2 kg (234 lb 2.1 oz) 104.1 kg (229 lb 8 oz)     Exam:  General exam: Pleasant middle-aged male lying comfortably supine in bed undergoing hemodialysis. Respiratory system: Clear. No increased work of breathing. Cardiovascular system: S1 & S2 heard, RRR. No JVD, murmurs, gallops, clicks or pedal edema. Not on telemetry. Gastrointestinal system: Abdomen is nondistended, soft and nontender. Normal bowel sounds heard. Central nervous system: Alert and oriented. No focal neurological deficits. Extremities: Symmetric 5 x 5 power.   Data Reviewed: Basic Metabolic Panel:  Recent Labs Lab 04/04/15 0558 04/05/15 0555 04/06/15 0552 04/06/15 1457 04/07/15 0406  NA 133* 134* 133* 133* 133*  K 5.0 5.4* 4.6 4.6 4.8  CL 92* 93* 95* 95* 95*  CO2 30 31 29 29 28   GLUCOSE 103* 97 152* 162* 157*  BUN 59* 63* 48* 49* 52*  CREATININE 6.92* 6.91* 6.03* 6.21* 6.01*  CALCIUM 8.2* 8.8 8.2* 8.1* 8.2*  PHOS 7.7* 7.6* 6.2* 6.4* 6.4*   Liver Function Tests:  Recent Labs Lab 04/04/15 0558 04/05/15 0555 04/06/15 0552 04/06/15 1457 04/07/15 0406  ALBUMIN 2.0* 2.0* 1.9* 1.9* 1.9*   No results for input(s): LIPASE, AMYLASE in the last 168 hours. No results for input(s): AMMONIA in the last 168 hours. CBC:  Recent Labs Lab 04/02/15 0520  04/04/15 0558 04/05/15 0555 04/05/15 2117 04/06/15 1457 04/07/15 0406  WBC 5.5  < > 5.8 6.5 6.9 7.5 7.0  NEUTROABS 2.9  --   --   --   --   --   --   HGB 7.4*  < > 7.0* 7.3* 7.3* 6.8* 7.5*  HCT 23.4*  < > 22.0* 23.6* 23.0* 21.4* 23.5*  MCV 88.0  < > 87.6 87.7 87.8 88.8 88.7  PLT 292  < > 294 326 299 291 269  < > = values in this interval not displayed. Cardiac Enzymes: No results for input(s): CKTOTAL, CKMB, CKMBINDEX, TROPONINI in the last 168 hours. BNP (last 3 results) No results for input(s): PROBNP in the last 8760 hours. CBG:  Recent Labs Lab 04/06/15 1141 04/06/15 1614  04/06/15 2150 04/07/15 0612 04/07/15 1125  GLUCAP 123* 148* 165* 129* 93    No results found for this or any previous visit (from the past 240 hour(s)).        Studies: No results found.      Scheduled Meds: . sodium chloride   Intravenous Once  . calcium acetate  1,334 mg Oral TID WC  . [START ON 04/08/2015] darbepoetin (ARANESP) injection - DIALYSIS  100 mcg Intravenous Q Tue-HD  . ferumoxytol  510 mg Intravenous Once  . furosemide  120 mg Oral BID  . gabapentin  300 mg Oral QHS  . heparin  5,000 Units Subcutaneous 3 times per day  . hydrALAZINE  50 mg Oral BID  . hydrocerin   Topical BID  . insulin aspart  0-9 Units Subcutaneous TID WC  . insulin detemir  12 Units Subcutaneous QHS  . isosorbide mononitrate  60 mg Oral Daily  . metoprolol tartrate  50 mg Oral BID WC  . senna-docusate  2 tablet Oral BID  . sodium chloride  3 mL Intravenous Q12H   Continuous Infusions:   Principal Problem:   CHF (congestive heart failure) Active Problems:   Peripheral edema   HTN (hypertension)   DM (diabetes mellitus)   Acute renal failure   Anemia   Hypokalemia   Congestive heart disease   Hypoxia   Acute on chronic diastolic CHF (congestive heart failure), NYHA class 4   Multiple myeloma    Time spent: 30 minutes    Delpha Perko, MD, FACP, FHM. [facility] Pager [phone]  If 7PM-7AM, please contact night-coverage [email or web address] Password [email or web address] 04/07/2015, 12:46 PM    LOS: 16 days

## 2015-04-07 NOTE — Progress Notes (Signed)
Pt a/o, no c/o pain, pt oob ad lib, pt NPO for possible AVF placement tom, VSS pt stable

## 2015-04-07 NOTE — Progress Notes (Addendum)
Pt to have right radial-cephalic AVF vs left brachiocephalic AVF.  After Dr. Oneida Alar discussed with pt, he would prefer a right radial-cephalic AVF.    Time for OR to be determined.   Leontine Locket 04/07/2015 10:21 AM   Agree with above.  Right radial cephalic AVF tomorrow NPO p midnight Consent  Ruta Hinds

## 2015-04-07 NOTE — Procedures (Signed)
I have personally attended this patient's dialysis session.  Tolerating second treatment well 2+ edema LE's TDC working well at [place], MD [place] Pager 04/07/2015, 8:13 AM

## 2015-04-08 ENCOUNTER — Encounter (HOSPITAL_COMMUNITY): Payer: Self-pay | Admitting: Certified Registered"

## 2015-04-08 ENCOUNTER — Inpatient Hospital Stay (HOSPITAL_COMMUNITY): Payer: Medicaid Other | Admitting: Certified Registered"

## 2015-04-08 ENCOUNTER — Encounter (HOSPITAL_COMMUNITY)
Admission: EM | Disposition: A | Discharge status: Home Health | Payer: Self-pay | Source: Home / Self Care | Attending: Internal Medicine

## 2015-04-08 DIAGNOSIS — N185 Chronic kidney disease, stage 5: Secondary | ICD-10-CM

## 2015-04-08 DIAGNOSIS — E11649 Type 2 diabetes mellitus with hypoglycemia without coma: Secondary | ICD-10-CM

## 2015-04-08 HISTORY — PX: AV FISTULA PLACEMENT: SHX1204

## 2015-04-08 LAB — GLUCOSE, CAPILLARY
GLUCOSE-CAPILLARY: 97 mg/dL (ref 70–99)
GLUCOSE-CAPILLARY: 83 mg/dL (ref 70–99)
Glucose-Capillary: 145 mg/dL — ABNORMAL HIGH (ref 70–99)
Glucose-Capillary: 67 mg/dL — ABNORMAL LOW (ref 70–99)
Glucose-Capillary: 83 mg/dL (ref 70–99)
Glucose-Capillary: 136 mg/dL — ABNORMAL HIGH (ref 70–99)

## 2015-04-08 LAB — CBC
HEMATOCRIT: 22.5 % — AB (ref 39.0–52.0)
Hemoglobin: 7.3 g/dL — ABNORMAL LOW (ref 13.0–17.0)
MCH: 29.4 pg (ref 26.0–34.0)
MCHC: 32.4 g/dL (ref 30.0–36.0)
MCV: 90.7 fL (ref 78.0–100.0)
Platelets: 261 10*3/uL (ref 150–400)
RBC: 2.48 MIL/uL — AB (ref 4.22–5.81)
RDW: 14.9 % (ref 11.5–15.5)
WBC: 5.9 10*3/uL (ref 4.0–10.5)

## 2015-04-08 LAB — ERYTHROPOIETIN: Erythropoietin: 27.6 m[IU]/mL — ABNORMAL HIGH (ref 2.6–18.5)

## 2015-04-08 LAB — RENAL FUNCTION PANEL
Albumin: 1.8 g/dL — ABNORMAL LOW (ref 3.5–5.0)
Anion gap: 9 (ref 5–15)
BUN: 32 mg/dL — ABNORMAL HIGH (ref 6–20)
CALCIUM: 8.3 mg/dL — AB (ref 8.9–10.3)
CO2: 30 mmol/L (ref 22–32)
CREATININE: 4.36 mg/dL — AB (ref 0.61–1.24)
Chloride: 95 mmol/L — ABNORMAL LOW (ref 101–111)
GFR calc Af Amer: 16 mL/min — ABNORMAL LOW (ref >60)
GFR, EST NON AFRICAN AMERICAN: 14 mL/min — AB (ref >60)
Glucose, Bld: 160 mg/dL — ABNORMAL HIGH (ref 70–99)
PHOSPHORUS: 4.7 mg/dL — AB (ref 2.5–4.6)
Potassium: 4 mmol/L (ref 3.5–5.1)
Sodium: 134 mmol/L — ABNORMAL LOW (ref 135–145)

## 2015-04-08 SURGERY — ARTERIOVENOUS (AV) FISTULA CREATION
Anesthesia: Monitor Anesthesia Care | Site: Arm Lower | Laterality: Left

## 2015-04-08 MED ORDER — MIDAZOLAM HCL 2 MG/2ML IJ SOLN
INTRAMUSCULAR | Status: AC
  Filled 2015-04-08: qty 2

## 2015-04-08 MED ORDER — PROPOFOL INFUSION 10 MG/ML OPTIME
INTRAVENOUS | Status: DC | PRN
  Administered 2015-04-08: 75 ug/kg/min via INTRAVENOUS

## 2015-04-08 MED ORDER — OXYCODONE-ACETAMINOPHEN 5-325 MG PO TABS: 1.0000 | ORAL_TABLET | Freq: Four times a day (QID) | ORAL | Status: DC | PRN

## 2015-04-08 MED ORDER — MIDAZOLAM HCL 5 MG/5ML IJ SOLN
INTRAMUSCULAR | Status: DC | PRN
  Administered 2015-04-08: 2 mg via INTRAVENOUS

## 2015-04-08 MED ORDER — HEPARIN SODIUM (PORCINE) 1000 UNIT/ML IJ SOLN
INTRAMUSCULAR | Status: AC
  Filled 2015-04-08: qty 1

## 2015-04-08 MED ORDER — HEPARIN SODIUM (PORCINE) 1000 UNIT/ML IJ SOLN
3000.0000 [IU] | Freq: Once | INTRAMUSCULAR | Status: AC
  Administered 2015-04-08: 2100 [IU] via INTRAVENOUS

## 2015-04-08 MED ORDER — THROMBIN 20000 UNITS EX SOLR
CUTANEOUS | Status: AC
  Filled 2015-04-08: qty 20000

## 2015-04-08 MED ORDER — PROPOFOL 10 MG/ML IV BOLUS
INTRAVENOUS | Status: DC | PRN
  Administered 2015-04-08: 40 mg via INTRAVENOUS
  Administered 2015-04-08: 20 mg via INTRAVENOUS
  Administered 2015-04-08: 50 mg via INTRAVENOUS

## 2015-04-08 MED ORDER — LIDOCAINE HCL (PF) 1 % IJ SOLN
INTRAMUSCULAR | Status: DC | PRN
  Administered 2015-04-08: 4 mL

## 2015-04-08 MED ORDER — INSULIN DETEMIR 100 UNIT/ML ~~LOC~~ SOLN
10.0000 [IU] | Freq: Every day | SUBCUTANEOUS | Status: DC
  Administered 2015-04-08: 10 [IU] via SUBCUTANEOUS
  Filled 2015-04-08: qty 0.1

## 2015-04-08 MED ORDER — 0.9 % SODIUM CHLORIDE (POUR BTL) OPTIME
TOPICAL | Status: DC | PRN
  Administered 2015-04-08: 1000 mL

## 2015-04-08 MED ORDER — PROPOFOL 500 MG/50ML IV EMUL
INTRAVENOUS | Status: AC
  Filled 2015-04-08: qty 50

## 2015-04-08 MED ORDER — DEXTROSE 50 % IV SOLN
INTRAVENOUS | Status: AC
  Administered 2015-04-08: 25 mL
  Filled 2015-04-08: qty 50

## 2015-04-08 MED ORDER — LIDOCAINE HCL (CARDIAC) 20 MG/ML IV SOLN
INTRAVENOUS | Status: AC
  Filled 2015-04-08: qty 5

## 2015-04-08 MED ORDER — LIDOCAINE HCL (PF) 1 % IJ SOLN
INTRAMUSCULAR | Status: AC
  Filled 2015-04-08: qty 30

## 2015-04-08 MED ORDER — FENTANYL CITRATE (PF) 100 MCG/2ML IJ SOLN
INTRAMUSCULAR | Status: DC | PRN
  Administered 2015-04-08: 100 ug via INTRAVENOUS

## 2015-04-08 MED ORDER — HEPARIN SODIUM (PORCINE) 1000 UNIT/ML IJ SOLN
INTRAMUSCULAR | Status: DC | PRN
  Administered 2015-04-08: 7000 [IU] via INTRAVENOUS

## 2015-04-08 MED ORDER — HEPARIN SODIUM (PORCINE) 5000 UNIT/ML IJ SOLN
INTRAMUSCULAR | Status: DC | PRN
  Administered 2015-04-08: 500 mL

## 2015-04-08 MED ORDER — FENTANYL CITRATE (PF) 250 MCG/5ML IJ SOLN
INTRAMUSCULAR | Status: AC
  Filled 2015-04-08: qty 5

## 2015-04-08 MED ORDER — FENTANYL CITRATE (PF) 100 MCG/2ML IJ SOLN: 25.0000 ug | INTRAMUSCULAR | Status: DC | PRN

## 2015-04-08 MED ORDER — PROPOFOL 1000 MG/100ML IV EMUL
INTRAVENOUS | Status: AC
  Filled 2015-04-08: qty 100

## 2015-04-08 MED ORDER — PROPOFOL 10 MG/ML IV BOLUS
INTRAVENOUS | Status: AC
  Filled 2015-04-08: qty 20

## 2015-04-08 SURGICAL SUPPLY — 35 items
ARMBAND PINK RESTRICT EXTREMIT (MISCELLANEOUS) ×2 IMPLANT
CANISTER SUCTION 2500CC (MISCELLANEOUS) ×2 IMPLANT
CANNULA VESSEL 3MM 2 BLNT TIP (CANNULA) ×2 IMPLANT
CLIP TI MEDIUM 6 (CLIP) ×2 IMPLANT
CLIP TI WIDE RED SMALL 6 (CLIP) ×2 IMPLANT
COVER PROBE W GEL 5X96 (DRAPES) IMPLANT
DECANTER SPIKE VIAL GLASS SM (MISCELLANEOUS) ×2 IMPLANT
DRAIN PENROSE 1/4X12 LTX STRL (WOUND CARE) ×2 IMPLANT
ELECT REM PT RETURN 9FT ADLT (ELECTROSURGICAL) ×2
ELECTRODE REM PT RTRN 9FT ADLT (ELECTROSURGICAL) ×1 IMPLANT
GLOVE BIO SURGEON STRL SZ 6.5 (GLOVE) ×2 IMPLANT
GLOVE BIO SURGEON STRL SZ7.5 (GLOVE) ×2 IMPLANT
GLOVE BIOGEL PI IND STRL 6.5 (GLOVE) ×4 IMPLANT
GLOVE BIOGEL PI INDICATOR 6.5 (GLOVE) ×4
GLOVE ECLIPSE 6.5 STRL STRAW (GLOVE) ×2 IMPLANT
GLOVE SURG SS PI 7.0 STRL IVOR (GLOVE) ×2 IMPLANT
GOWN STRL REUS W/ TWL LRG LVL3 (GOWN DISPOSABLE) ×4 IMPLANT
GOWN STRL REUS W/TWL LRG LVL3 (GOWN DISPOSABLE) ×4
KIT BASIN OR (CUSTOM PROCEDURE TRAY) ×2 IMPLANT
KIT ROOM TURNOVER OR (KITS) ×2 IMPLANT
LIQUID BAND (GAUZE/BANDAGES/DRESSINGS) ×2 IMPLANT
LOOP VESSEL MINI RED (MISCELLANEOUS) ×2 IMPLANT
NS IRRIG 1000ML POUR BTL (IV SOLUTION) ×2 IMPLANT
PACK CV ACCESS (CUSTOM PROCEDURE TRAY) ×2 IMPLANT
PAD ARMBOARD 7.5X6 YLW CONV (MISCELLANEOUS) ×4 IMPLANT
SPONGE SURGIFOAM ABS GEL 100 (HEMOSTASIS) IMPLANT
SUT PROLENE 6 0 BV (SUTURE) IMPLANT
SUT PROLENE 7 0 BV 1 (SUTURE) ×4 IMPLANT
SUT SILK 3 0 (SUTURE) ×1
SUT SILK 3-0 18XBRD TIE 12 (SUTURE) ×1 IMPLANT
SUT VIC AB 3-0 SH 27 (SUTURE) ×1
SUT VIC AB 3-0 SH 27X BRD (SUTURE) ×1 IMPLANT
SUT VICRYL 4-0 PS2 18IN ABS (SUTURE) ×2 IMPLANT
UNDERPAD 30X30 INCONTINENT (UNDERPADS AND DIAPERS) ×2 IMPLANT
WATER STERILE IRR 1000ML POUR (IV SOLUTION) ×2 IMPLANT

## 2015-04-08 NOTE — Anesthesia Preprocedure Evaluation (Addendum)
Anesthesia Evaluation  Patient identified by MRN, date of birth, ID band Patient awake    Reviewed: Allergy & Precautions, NPO status , Patient's Chart, lab work & pertinent test results  History of Anesthesia Complications Negative for: history of anesthetic complications  Airway Mallampati: II  TM Distance: >3 FB Neck ROM: Full    Dental  (+) Edentulous Upper, Edentulous Lower   Pulmonary neg pulmonary ROS,  breath sounds clear to auscultation        Cardiovascular hypertension, Pt. on medications and Pt. on home beta blockers - angina+CHF - Past MI - dysrhythmias Rhythm:Regular     Neuro/Psych negative neurological ROS  negative psych ROS   GI/Hepatic negative GI ROS, Neg liver ROS,   Endo/Other  diabetes, Type 2, Insulin Dependent  Renal/GU Dialysis and Renal InsufficiencyRenal disease     Musculoskeletal   Abdominal   Peds  Hematology  (+) anemia ,   Anesthesia Other Findings Left ventricle: Decrease thickening of the inferolateral segments and the base inferior segment. EF is 45-50%. The cavity size was normal. Wall thickness was increased in a pattern of moderate to severe LVH. Findings consistent with left ventricular diastolic dysfunction. - Left atrium: The atrium was mildly dilated. - Right ventricle: The cavity size was normal. Systolic function was normal. - Pericardium, extracardiac: A trivial pericardial effusion was identified posterior to the heart.    Reproductive/Obstetrics                            Anesthesia Physical Anesthesia Plan  ASA: III  Anesthesia Plan: MAC   Post-op Pain Management:    Induction: Intravenous  Airway Management Planned: Natural Airway  Additional Equipment: None  Intra-op Plan:   Post-operative Plan:   Informed Consent: I have reviewed the patients History and Physical, chart, labs and discussed the procedure  including the risks, benefits and alternatives for the proposed anesthesia with the patient or authorized representative who has indicated his/her understanding and acceptance.   Dental advisory given  Plan Discussed with: CRNA and Surgeon  Anesthesia Plan Comments:         Anesthesia Quick Evaluation

## 2015-04-08 NOTE — Op Note (Signed)
Procedure: Left Radial Cephalic AV fistula   Preop: ESRD   Postop: ESRD   Anesthesia: MAC with local   Assistant: Silva Bandy, PA-c   Findings: 3 mm cephalic vein       3 mm radial artery   Procedure Details:  The left upper extremity was prepped and draped in usual sterile fashion. Local anesthesia was infiltrated midway between the cephalic and radial artery anatomically.  A longitudinal skin incision was then made at the distal left forearm. The incision was carried into the subcutaneous tissues down to level cephalic vein. The vein had some spasm but was overall reasonable quality accepting a 3.5 mm dilator. The vein was dissected free circumferentially and small side branches ligated and divided between silk ties. The distal end was ligated and the vein probed and found to accept up to a 3.5 mm dilator. This was gently distended with heparinized saline, spatulated, and marked for orientation. Next the radial artery was dissected free in the medial portion incision. The artery was 3 mm in diameter but had a reasonable pulse. The vessel loops were placed proximal and distal to the planned site of arteriotomy. The patient was given 7000 units of intravenous heparin. After appropriate circulation time, the vessel loops were used to control the artery. A longitudinal opening was made in the left radial artery. The vein was controlled proximally with a fine bulldog clamp. The vein was then swung over to the artery and sewn end of vein to side of artery using a running 7-0 Prolene suture. Just prior to completion, the anastomosis was fore bled back bled and thoroughly flushed. The anastomosis was secured, vessel loops released, and there was a palpable thrill in the fistula immediately. After hemostasis was obtained, the subcutaneous tissues were reapproximated using a running 3-0 Vicryl suture. The skin was then closed with a 4 Vicryl subcuticular stitch. Dermabond was applied to the skin incision. The  patient tolerated the procedure well and there were no complications. Instrument sponge and needle count were correct at the end of the case. The patient was taken to PACU in stable condition.   Ruta Hinds, MD  [facility]  Office: [phone]  Pager: [phone]

## 2015-04-08 NOTE — Progress Notes (Addendum)
PROGRESS NOTE    James [name] KGM:[id number] DOB: 1956/10/23 DOA: 03/22/2015 PCP: No primary care provider on file.  HPI/Brief narrative 59yo who presented with scrotal swelling and generalized edema. The patient was found to have elevated creatinine and evidence of heart failure.Cardiology was consulted and started IV lasix.Records demonstrated he had sharp rise in creatinine over the last few months.Nephrology was consulted.On his SPEP he had an M spike. It is possible that his acute kidney injury is secondary to multiple myeloma as opposed to hypertension and diabetes. Nephrology recommended pursuing bone marrow biopsy instead of kidney biopsy showed his confirmatory test such as IFE,/lambda light chain, beta-2 microglobulin return positive. Bone survery was negative. Discordant protein on urinalysis compared to total protein collected from 24-hour urine collection suspicious for multiple myeloma. Bone marrow and kidney biopsy on Wed.   Assessment/Plan:  Acute hypoxic respiratory failure secondary to acute on chronic systolic and diastolic congestive heart failure - 2-D echo with ejection fraction 45-50% with LV diastolic dysfunction - TSH within normal limits - Appreciate cardiology assistance, cardiology following prn, last note on 4/25 - Started HD on 4/30, Lasix per nephrology - Resolved  Acute kidney injury/nephrotic syndrome/M spike on SPEP/new ESRD - Nephrology follow-up appreciated - ANCA, ASO, HIV, GBM Ab, HCV, HBV, ANA, dsDNA negative. C3 and &C4 WNL.  - Renal biopsy 4/27 showed collapsing FSGS and no evidence of monoclonal process. - As per nephrology, patient now ESRD. Started hemodialysis 4/30 via a tunneled HD catheter placed on 4/29.  - Left upper extremity AVF being placed 5/3 by VVS  Probable multiple myeloma  - Beta-2 microglobulin 8.3, cryoglobulin pending, upep IFE igG kappa, spep mspike 1.8%, SFL kappa 113 k/l ratio 3.7 - IgG 2678, IgA 102,  IgM <6 - Bone survey negative - Bone marrow biopsy 4/27: 11% plasma cell /keppa restricted, cytogenetics pending - Oncology consultation appreciated and they are "on the fence" with regard to treatment of multiple myeloma. No evidence of monoclonal process on renal biopsy  Diabetes mellitus type 2 with renal complications/hypoglycemia, - Hemoglobin A1c: 7.2 - Hypoglycemic CBG 67 mg per DL this morning. Possibly from nothing by mouth status.  - Will reduce Levemir from 12 to 10 units QHS - continue SSI  Essential hypertension - Controlled on metoprolol, hydralazine and Imdur  -ARB held due to AKI  Anemia - Folate/ b12/tsh wnl, epo level pending, FOBT pending, does has iron deficiency, Started iron supplementation - Avoid epo for now while working up MM -  hgb dropped below 7 on 5/1, 1unit prbc given.  - As per oncology, anemia felt to be secondary to Epo deficiency and nephrology have started Aranesp.  - Patient s/p Feraheme 5/2 - Follow CBC and transfuse if hemoglobin less than 7 g per DL.  Secondary hyperparathyroidism - Started on calcitriol-discontinued by nephrology due to PTH only 118   Code Status: Full Family Communication: None at bedside Disposition Plan: Home when medically stable   Consultants:  Cardiology  Nephrology  IR for [facility] & Renal Biopsy  Medical Oncology  VVS  Procedures:  BM & Renal Bx on 4/27  HD form 4/30   Antibiotics:  None  Subjective: Patient was seen at hemodialysis this morning. Denied complaints. Denied pain issues or dyspnea.  Objective: Filed Vitals:   04/08/15 1030 04/08/15 1100 04/08/15 1105 04/08/15 1149  BP: 136/69 144/72 146/79 142/65  Pulse: 73 74 77   Temp:   98.4 F (36.9 C)   TempSrc:   Oral   Resp:  12   Height:      Weight:   99 kg (218 lb 4.1 oz)   SpO2:   97%     Intake/Output Summary (Last 24 hours) at 04/08/15 1314 Last data filed at 04/08/15 1105  Gross per 24 hour  Intake    460 ml    Output   5125 ml  Net  -4665 ml   Filed Weights   04/08/15 0554 04/08/15 0758 04/08/15 1105  Weight: 102.377 kg (225 lb 11.2 oz) 101.9 kg (224 lb 10.4 oz) 99 kg (218 lb 4.1 oz)     Exam:  General exam: Pleasant middle-aged male lying comfortably supine in bed undergoing hemodialysis. Respiratory system: Clear. No increased work of breathing. Cardiovascular system: S1 & S2 heard, RRR. No JVD, murmurs, gallops, clicks or pedal edema. Not on telemetry. Gastrointestinal system: Abdomen is nondistended, soft and nontender. Normal bowel sounds heard. Central nervous system: Alert and oriented. No focal neurological deficits. Extremities: Symmetric 5 x 5 power.   Data Reviewed: Basic Metabolic Panel:  Recent Labs Lab 04/05/15 0555 04/06/15 0552 04/06/15 1457 04/07/15 0406 04/08/15 0445  NA 134* 133* 133* 133* 134*  K 5.4* 4.6 4.6 4.8 4.0  CL 93* 95* 95* 95* 95*  CO2 31 29 29 28 30   GLUCOSE 97 152* 162* 157* 160*  BUN 63* 48* 49* 52* 32*  CREATININE 6.91* 6.03* 6.21* 6.01* 4.36*  CALCIUM 8.8 8.2* 8.1* 8.2* 8.3*  PHOS 7.6* 6.2* 6.4* 6.4* 4.7*   Liver Function Tests:  Recent Labs Lab 04/05/15 0555 04/06/15 0552 04/06/15 1457 04/07/15 0406 04/08/15 0445  ALBUMIN 2.0* 1.9* 1.9* 1.9* 1.8*   No results for input(s): LIPASE, AMYLASE in the last 168 hours. No results for input(s): AMMONIA in the last 168 hours. CBC:  Recent Labs Lab 04/02/15 0520  04/05/15 0555 04/05/15 2117 04/06/15 1457 04/07/15 0406 04/08/15 0445  WBC 5.5  < > 6.5 6.9 7.5 7.0 5.9  NEUTROABS 2.9  --   --   --   --   --   --   HGB 7.4*  < > 7.3* 7.3* 6.8* 7.5* 7.3*  HCT 23.4*  < > 23.6* 23.0* 21.4* 23.5* 22.5*  MCV 88.0  < > 87.7 87.8 88.8 88.7 90.7  PLT 292  < > 326 299 291 269 261  < > = values in this interval not displayed. Cardiac Enzymes: No results for input(s): CKTOTAL, CKMB, CKMBINDEX, TROPONINI in the last 168 hours. BNP (last 3 results) No results for input(s): PROBNP in the last  8760 hours. CBG:  Recent Labs Lab 04/07/15 1629 04/07/15 2113 04/08/15 0557 04/08/15 1129 04/08/15 1156  GLUCAP 118* 139* 145* 67* 97    No results found for this or any previous visit (from the past 240 hour(s)).        Studies: No results found.      Scheduled Meds: . [MAR Hold] sodium chloride   Intravenous Once  . [MAR Hold] calcium acetate  1,334 mg Oral TID WC  . [MAR Hold] darbepoetin (ARANESP) injection - DIALYSIS  100 mcg Intravenous Q Tue-HD  . [MAR Hold] ferumoxytol  510 mg Intravenous Once  . [MAR Hold] furosemide  120 mg Oral BID  . [MAR Hold] gabapentin  300 mg Oral QHS  . [MAR Hold] heparin  5,000 Units Subcutaneous 3 times per day  . [MAR Hold] hydrALAZINE  50 mg Oral BID  . [MAR Hold] hydrocerin   Topical BID  . [MAR Hold] insulin aspart  0-9 Units Subcutaneous TID WC  . [MAR Hold] insulin detemir  12 Units Subcutaneous QHS  . [MAR Hold] isosorbide mononitrate  60 mg Oral Daily  . [MAR Hold] metoprolol tartrate  50 mg Oral BID WC  . [MAR Hold] senna-docusate  2 tablet Oral BID  . [MAR Hold] sodium chloride  3 mL Intravenous Q12H   Continuous Infusions:   Principal Problem:   CHF (congestive heart failure) Active Problems:   Peripheral edema   HTN (hypertension)   DM (diabetes mellitus)   Acute renal failure   Anemia   Hypokalemia   Congestive heart disease   Hypoxia   Acute on chronic diastolic CHF (congestive heart failure), NYHA class 4   Multiple myeloma    Time spent: 30 minutes    Uno Esau, MD, FACP, FHM. [facility] Pager [phone]  If 7PM-7AM, please contact night-coverage [email or web address] Password [email or web address] 04/08/2015, 1:14 PM    LOS: 17 days

## 2015-04-08 NOTE — Transfer of Care (Signed)
Immediate Anesthesia Transfer of Care Note  Patient: James [name]  Procedure(s) Performed: Procedure(s): LEFT RADIOCEPHALIC ARTERIOVENOUS (AV) FISTULA CREATION (Left)  Patient Location: PACU  Anesthesia Type:MAC  Level of Consciousness: sedated  Airway & Oxygen Therapy: Patient Spontanous Breathing and Patient connected to nasal cannula oxygen  Post-op Assessment: Report given to RN and Post -op Vital signs reviewed and stable  Post vital signs: Reviewed and stable  Last Vitals:  Filed Vitals:   04/08/15 1149  BP: 142/65  Pulse:   Temp:   Resp:     Complications: No apparent anesthesia complications

## 2015-04-08 NOTE — Progress Notes (Signed)
Diatek cath accessed per Dr. Ermalene Postin,  10cc of blood aspirated.  NS hooked up, running well.  DA

## 2015-04-08 NOTE — Procedures (Signed)
I have personally attended this patient's dialysis session.   Dialysis #3 today Pre weight 101.9 and still with edema but improving K 4 TDC 400  Jamal Maes, MD [place] Pager 04/08/2015, 11:07 AM

## 2015-04-08 NOTE — Progress Notes (Signed)
04/08/2015 9:31 AM Hemodialysis Outpatient Note; the "CLIP" process has been initiated, I spoke with the patient and he agrees that the [facility] will be the closest to him. No preference in schedule, system has incorrect address listed for patient, correct address is [address],[place] [address].Not sure how to rectify that in the system. I have also alerted our financial counselors to speak with patient regarding an medicaid application, they have that planned for tomorrow at [facility] in his room. I will follow up when I receive any new information. Thank you.Gordy Savers

## 2015-04-08 NOTE — H&P (View-Only) (Signed)
Pt to have right radial-cephalic AVF vs left brachiocephalic AVF.  After Dr. Oneida Alar discussed with pt, he would prefer a right radial-cephalic AVF.    Time for OR to be determined.   Leontine Locket 04/07/2015 10:21 AM   Agree with above.  Right radial cephalic AVF tomorrow NPO p midnight Consent  Ruta Hinds

## 2015-04-08 NOTE — Anesthesia Procedure Notes (Signed)
Procedure Name: MAC Date/Time: 04/08/2015 1:00 PM Performed by: Melina Copa, Chetan Mehring R Pre-anesthesia Checklist: Patient identified, Emergency Drugs available, Suction available, Patient being monitored and Timeout performed Patient Re-evaluated:Patient Re-evaluated prior to inductionOxygen Delivery Method: Circle system utilized Preoxygenation: Pre-oxygenation with 100% oxygen Placement Confirmation: positive ETCO2 Dental Injury: Teeth and Oropharynx as per pre-operative assessment

## 2015-04-08 NOTE — Interval H&P Note (Signed)
History and Physical Interval Note:  04/08/2015 12:48 PM  Lengthy conversation with pt yesterday regarding side of fistula placement.  Despite being right handed he had opted for a right radial cephalic AVF.  Today he now states that he has changed his mind and wants his access in his left arm.  An additional conversation with patient to confirm he definitely now wants left side and is not going to change his mind again.  Will proceed with left brachial cephalic AVF today.  He will need IV removed from left arm.  Risks benefits complications and procedure details discussed.  Ruta Hinds, MD [facility] Office: [phone] Pager: [phone]

## 2015-04-08 NOTE — Progress Notes (Signed)
Renal Daily Progress Note  S: Pt receiving HD, feels "better and better." States he is right hand-dominant.  O:BP 142/76 mmHg  Pulse 75  Temp(Src) 100.2 F (37.9 C) (Oral)  Resp 18  Ht 5' 5" (1.651 m)  Wt 225 lb 11.2 oz (102.377 kg)  BMI 37.56 kg/m2  SpO2 98%  Intake/Output Summary (Last 24 hours) at 04/08/15 0735 Last data filed at 04/08/15 0600  Gross per 24 hour  Intake    820 ml  Output   4625 ml  Net  -3805 ml   Intake/Output: I/O last 3 completed shifts: In: 1408.6 [P.O.:1040; Blood:368.6] Out: 5750 [Urine:3750; Other:2000]  Intake/Output this shift:    Weight change: 8.5 oz (0.24 kg) Gen: Sleeping 59 y.o.male receiving HD in no distress CV: Regular rate, no murmur, gallop or rub, 1-2 + pitting LE edema Pulm: Non-labored breathing ambient air, CTAB, no wheezes or crackles GI: +BS, soft, non-tender, non-distended Skin: No rashes or bruising Access: tunneled catheter.    Recent Labs Lab 04/03/15 0410 04/04/15 0558 04/05/15 0555 04/06/15 0552 04/06/15 1457 04/07/15 0406 04/08/15 0445  NA 133* 133* 134* 133* 133* 133* 134*  K 4.8 5.0 5.4* 4.6 4.6 4.8 4.0  CL 92* 92* 93* 95* 95* 95* 95*  CO2 33* _0 GLUCOSE 164* 103* 97 152* 162* 157* 160*  BUN 55* 59* 63* 48* 49* 52* 32*  CREATININE 6.44* 6.92* 6.91* 6.03* 6.21* 6.01* 4.36*  ALBUMIN 1.8* 2.0* 2.0* 1.9* 1.9* 1.9* 1.8*  CALCIUM 7.8* 8.2* 8.8 8.2* 8.1* 8.2* 8.3*  PHOS 7.6* 7.7* 7.6* 6.2* 6.4* 6.4* 4.7*  CBC:  Recent Labs Lab 04/02/15 0520  04/05/15 0555 04/05/15 2117 04/06/15 1457 04/07/15 0406 04/08/15 0445  WBC 5.5  < > 6.5 6.9 7.5 7.0 5.9  NEUTROABS 2.9  --   --   --   --   --   --   HGB 7.4*  < > 7.3* 7.3* 6.8* 7.5* 7.3*  HCT 23.4*  < > 23.6* 23.0* 21.4* 23.5* 22.5*  MCV 88.0  < > 87.7 87.8 88.8 88.7 90.7  PLT 292  < > 326 299 291 269 261  < > = values in this interval not displayed.  CBG:  Recent Labs Lab 04/07/15 0612 04/07/15 1125 04/07/15 1629 04/07/15 2113  04/08/15 0557  GLUCAP 129* 93 118* 139* 145*   No results found. . sodium chloride   Intravenous Once  . calcium acetate  1,334 mg Oral TID WC  . cefUROXime (ZINACEF)  IV  1.5 g Intravenous To SSTC  . darbepoetin (ARANESP) injection - DIALYSIS  100 mcg Intravenous Q Tue-HD  . ferumoxytol  510 mg Intravenous Once  . furosemide  120 mg Oral BID  . gabapentin  300 mg Oral QHS  . heparin  5,000 Units Subcutaneous 3 times per day  . hydrALAZINE  50 mg Oral BID  . hydrocerin   Topical BID  . insulin aspart  0-9 Units Subcutaneous TID WC  . insulin detemir  12 Units Subcutaneous QHS  . isosorbide mononitrate  60 mg Oral Daily  . metoprolol tartrate  50 mg Oral BID WC  . senna-docusate  2 tablet Oral BID  . sodium chloride  3 mL Intravenous Q12H   23M with hx/o DM2 and HTN admitted with nephrotic proteinuria, AKI, positive M-Spike on SPEP and K:L sFLC 3.78. Found to have LVH and dCHF. Renal Bx with collapsing FSGS explaining proteinuric rapid progression of CKD. Bone marrow biopsy  with 11% plasma cells consistent with IgG Kappa multiple myeloma  A/P 1. AKI + Nephrotic Syndrome + M-spike on SPEP and sFLC K:L 3.78. ANCA, ASO, HIV, GBM Ab, HCV, HBV, ANA, dsDNA negative. C3 and &C4 WNL.  1. Renal Bx 4/27 with collapsing FSGS 1. Prelim report with no evidence of monoclonal process 2. Collapsing FSGS is completely explanatory of renal course 3. PT NOW ESRD and will proceed with ACCESS, CLIP process accordingly. Lives in [place].  4. AVF to be placed by [facility] today (suspect left arm access would be preferable). Has tunnelled HD cath 04/04/15.  2. Continue diuretics to help with volume, change to po, expect UOP will dwindle off eventually 3. HD #3 today - weight down ~ 40 lbs since admission 2. Multiple Myeloma s/p BM Bx 4/27 with 11% plasma cells., Oncology has seen and is "on the fence" with regard to treatment of MM. He does not have evidence for monoclonal process on his kidney biopsy.   3. HTN: stable on reduced anti-hypertensives, ARB held. May reduce/remove meds for pressure to remove more volume. 4. DM2: unclear history 5. Hyperphosphatemia: cont PhosLo 2qAC. PTH only 118. Calcitriol stopped. (Goal PTH for Stage 5 CKD 150-300) 6. Anemia: Appears oncology feels more likely EPO deficient: Aranesp started. Got Feraheme for Fe deficiency component  Vance Gather  I have seen and examined this patient and agree with plan and assessment as in the above note of Dr. Bonner Puna. Pt with new ESRD d/t callapsing FSG. HD today via [facility], then permanent access. CLIP process underway (pt is uninsured - Development worker, community to help with emergency Medicaid application tomorrow - cannot be assigned a spot until has pending Medicaid number.  (He lives in [place] closest to [employer])  Oncology is "on the fence" re treatment of his myeloma. Have left lasix going as pt still makes fair amt of urine.  On po dosing. Mikah Poss B,MD 04/08/2015 3:05 PM

## 2015-04-09 ENCOUNTER — Encounter (HOSPITAL_COMMUNITY): Payer: Self-pay | Admitting: Vascular Surgery

## 2015-04-09 DIAGNOSIS — D509 Iron deficiency anemia, unspecified: Secondary | ICD-10-CM

## 2015-04-09 DIAGNOSIS — I120 Hypertensive chronic kidney disease with stage 5 chronic kidney disease or end stage renal disease: Secondary | ICD-10-CM

## 2015-04-09 LAB — CBC
HCT: 22.8 % — ABNORMAL LOW (ref 39.0–52.0)
HEMOGLOBIN: 7.3 g/dL — AB (ref 13.0–17.0)
MCH: 29 pg (ref 26.0–34.0)
MCHC: 32 g/dL (ref 30.0–36.0)
MCV: 90.5 fL (ref 78.0–100.0)
PLATELETS: 256 10*3/uL (ref 150–400)
RBC: 2.52 MIL/uL — AB (ref 4.22–5.81)
RDW: 14.9 % (ref 11.5–15.5)
WBC: 5.6 10*3/uL (ref 4.0–10.5)

## 2015-04-09 LAB — RENAL FUNCTION PANEL
ANION GAP: 8 (ref 5–15)
Albumin: 1.8 g/dL — ABNORMAL LOW (ref 3.5–5.0)
BUN: 19 mg/dL (ref 6–20)
CO2: 29 mmol/L (ref 22–32)
Calcium: 8 mg/dL — ABNORMAL LOW (ref 8.9–10.3)
Chloride: 99 mmol/L — ABNORMAL LOW (ref 101–111)
Creatinine, Ser: 3.31 mg/dL — ABNORMAL HIGH (ref 0.61–1.24)
GFR calc Af Amer: 22 mL/min — ABNORMAL LOW (ref >60)
GFR calc non Af Amer: 19 mL/min — ABNORMAL LOW (ref >60)
GLUCOSE: 79 mg/dL (ref 70–99)
PHOSPHORUS: 3.6 mg/dL (ref 2.5–4.6)
POTASSIUM: 3.9 mmol/L (ref 3.5–5.1)
Sodium: 136 mmol/L (ref 135–145)

## 2015-04-09 LAB — GLUCOSE, CAPILLARY
GLUCOSE-CAPILLARY: 55 mg/dL — AB (ref 70–99)
GLUCOSE-CAPILLARY: 84 mg/dL (ref 70–99)
Glucose-Capillary: 110 mg/dL — ABNORMAL HIGH (ref 70–99)
Glucose-Capillary: 120 mg/dL — ABNORMAL HIGH (ref 70–99)

## 2015-04-09 MED ORDER — SODIUM CHLORIDE 0.9 % IV SOLN
510.0000 mg | Freq: Once | INTRAVENOUS | Status: AC
  Administered 2015-04-09: 510 mg via INTRAVENOUS
  Filled 2015-04-09: qty 17

## 2015-04-09 NOTE — Progress Notes (Addendum)
PROGRESS NOTE    James [name] ZOX:[id number] DOB: [birth year] DOA: 03/22/2015 PCP: No primary care provider on file.  HPI/Brief narrative 59yo who presented with scrotal swelling and generalized edema. The patient was found to have elevated creatinine and evidence of heart failure.Cardiology was consulted and started IV lasix.Records demonstrated he had sharp rise in creatinine over the last few months.Nephrology was consulted.On his SPEP he had an M spike. It is possible that his acute kidney injury is secondary to multiple myeloma as opposed to hypertension and diabetes. Nephrology recommended pursuing bone marrow biopsy instead of kidney biopsy showed his confirmatory test such as IFE,/lambda light chain, beta-2 microglobulin return positive. Bone survery was negative. Discordant protein on urinalysis compared to total protein collected from 24-hour urine collection suspicious for multiple myeloma. Bone marrow and kidney biopsy on Wed.   Assessment/Plan:  Acute hypoxic respiratory failure secondary to acute on chronic systolic and diastolic congestive heart failure - 2-D echo with ejection fraction 45-50% with LV diastolic dysfunction - TSH within normal limits - Appreciate cardiology assistance, cardiology following prn, last note on 4/25 - Started HD on 4/30, Lasix per nephrology -  Resolved  New ESRD, due to collapsing FSGS - Nephrology follow-up appreciated - ANCA, ASO, HIV, GBM Ab, HCV, HBV, ANA, dsDNA negative. C3 and &C4 WNL.  - Renal biopsy 4/27 showed collapsing FSGS and no evidence of monoclonal process. - As per nephrology, patient now ESRD. Started hemodialysis 4/30 via a tunneled HD catheter placed on 4/29.  - Left upper extremity AVF placed 5/3 by VVS. Dr. Darrick Penna signed off on 5/4-outpatient follow-up in 1 month - Still on high-dose IV Lasix per nephrology. EDW not yet established. - Awaiting emergency Medicaid and outpatient dialysis arrangement at  which time he could be discharged home.  Probable multiple myeloma  - Beta-2 microglobulin 8.3, cryoglobulin pending, upep IFE igG kappa, spep mspike 1.8%, SFL kappa 113 k/l ratio 3.7 - IgG 2678, IgA 102, IgM <6 - Bone survey negative - Bone marrow biopsy 4/27: 11% plasma cell /keppa restricted, cytogenetics pending - Oncology consultation appreciated and they are "on the fence" with regard to treatment of multiple myeloma.  - No evidence of monoclonal process on renal biopsy  Diabetes mellitus type 2 with renal complications/hypoglycemia, - Hemoglobin A1c: 7.2 - Hypoglycemic CBG 67 mg per DL 5/3 AM. Possibly from nothing by mouth status.  - Reduced Levemir from 12 to 10 units QHS on 5/3 - continue SSI - again hypoglycemic 5/4 AM- DC'ed Levemir. Monitor  Essential hypertension - Controlled on metoprolol, hydralazine and Imdur  -ARB held due to AKI - May need to reduce meds as blood pressure improves with volume offloading across dialysis.  Anemia - Folate/ b12/tsh wnl, epo level pending, FOBT pending, does has iron deficiency, Started iron supplementation - Avoid epo for now while working up MM -  hgb dropped below 7 on 5/1, 1unit prbc given.  - As per oncology, anemia felt to be secondary to Epo deficiency and nephrology have started Aranesp.  - Patient s/p Feraheme 5/2 - Follow CBC and transfuse if hemoglobin less than 7 g per DL.  Secondary hyperparathyroidism - Started on calcitriol-discontinued by nephrology due to PTH only 118   Code Status: Full Family Communication: Discussed with mother on 04/09/2015. Disposition Plan: Home when medically stable   Consultants:  Cardiology  Nephrology  IR for [facility] & Renal Biopsy  Medical Oncology  VVS  Procedures:  BM & Renal Bx on 4/27  HD form 4/30  Tunneled dialysis catheter by IR 4/29  LUE AVF 5/3  Antibiotics:  None  Subjective: Patient noted to be hypoglycemic this morning but denies  weakness, dizziness or sweating. Denies any other complaints.  Objective: Filed Vitals:   04/08/15 1600 04/08/15 2110 04/09/15 0634 04/09/15 0924  BP: 141/62 122/50 158/77 120/58  Pulse: 68 71 78 83  Temp: 97.9 F (36.6 C) 98.8 F (37.1 C) 99.6 F (37.6 C)   TempSrc: Oral Oral Oral   Resp: 16 18 18 18   Height:      Weight:   99.066 kg (218 lb 6.4 oz)   SpO2: 100% 93% 96% 97%    Intake/Output Summary (Last 24 hours) at 04/09/15 1423 Last data filed at 04/09/15 0929  Gross per 24 hour  Intake    740 ml  Output   1150 ml  Net   -410 ml   Filed Weights   04/08/15 0758 04/08/15 1105 04/09/15 0634  Weight: 101.9 kg (224 lb 10.4 oz) 99 kg (218 lb 4.1 oz) 99.066 kg (218 lb 6.4 oz)     Exam:  General exam: Pleasant middle-aged male lying comfortably supine in bed. Respiratory system: Clear. No increased work of breathing. Cardiovascular system: S1 & S2 heard, RRR. No JVD, murmurs, gallops, clicks or pedal edema. Not on telemetry. Gastrointestinal system: Abdomen is nondistended, soft and nontender. Normal bowel sounds heard. Central nervous system: Alert and oriented. No focal neurological deficits. Extremities: Symmetric 5 x 5 power.   Data Reviewed: Basic Metabolic Panel:  Recent Labs Lab 04/06/15 0552 04/06/15 1457 04/07/15 0406 04/08/15 0445 04/09/15 0355  NA 133* 133* 133* 134* 136  K 4.6 4.6 4.8 4.0 3.9  CL 95* 95* 95* 95* 99*  CO2 29 29 28 30 29   GLUCOSE 152* 162* 157* 160* 79  BUN 48* 49* 52* 32* 19  CREATININE 6.03* 6.21* 6.01* 4.36* 3.31*  CALCIUM 8.2* 8.1* 8.2* 8.3* 8.0*  PHOS 6.2* 6.4* 6.4* 4.7* 3.6   Liver Function Tests:  Recent Labs Lab 04/06/15 0552 04/06/15 1457 04/07/15 0406 04/08/15 0445 04/09/15 0355  ALBUMIN 1.9* 1.9* 1.9* 1.8* 1.8*   No results for input(s): LIPASE, AMYLASE in the last 168 hours. No results for input(s): AMMONIA in the last 168 hours. CBC:  Recent Labs Lab 04/05/15 2117 04/06/15 1457 04/07/15 0406  04/08/15 0445 04/09/15 0355  WBC 6.9 7.5 7.0 5.9 5.6  HGB 7.3* 6.8* 7.5* 7.3* 7.3*  HCT 23.0* 21.4* 23.5* 22.5* 22.8*  MCV 87.8 88.8 88.7 90.7 90.5  PLT 299 291 269 261 256   Cardiac Enzymes: No results for input(s): CKTOTAL, CKMB, CKMBINDEX, TROPONINI in the last 168 hours. BNP (last 3 results) No results for input(s): PROBNP in the last 8760 hours. CBG:  Recent Labs Lab 04/08/15 1429 04/08/15 1614 04/08/15 2113 04/09/15 0609 04/09/15 0644  GLUCAP 83 83 136* 55* 84    No results found for this or any previous visit (from the past 240 hour(s)).        Studies: No results found.      Scheduled Meds: . sodium chloride   Intravenous Once  . calcium acetate  1,334 mg Oral TID WC  . darbepoetin (ARANESP) injection - DIALYSIS  100 mcg Intravenous Q Tue-HD  . ferumoxytol  510 mg Intravenous Once  . furosemide  120 mg Oral BID  . gabapentin  300 mg Oral QHS  . heparin  5,000 Units Subcutaneous 3 times per day  . hydrALAZINE  50 mg Oral BID  .  hydrocerin   Topical BID  . insulin aspart  0-9 Units Subcutaneous TID WC  . isosorbide mononitrate  60 mg Oral Daily  . metoprolol tartrate  50 mg Oral BID WC  . senna-docusate  2 tablet Oral BID  . sodium chloride  3 mL Intravenous Q12H   Continuous Infusions:   Principal Problem:   CHF (congestive heart failure) Active Problems:   Peripheral edema   HTN (hypertension)   DM (diabetes mellitus)   Acute renal failure   Anemia   Hypokalemia   Congestive heart disease   Hypoxia   Acute on chronic diastolic CHF (congestive heart failure), NYHA class 4   Multiple myeloma    Time spent: 20 minutes    Kyleen Villatoro, MD, FACP, FHM. [facility] Pager [phone]  If 7PM-7AM, please contact night-coverage [email or web address] Password [email or web address] 04/09/2015, 2:23 PM    LOS: 18 days

## 2015-04-09 NOTE — Progress Notes (Signed)
Pt with CHF/AKI; new HD started on 04/05/15.  Pt assisted with completion of Medicaid application this AM by financial counselor.  CLIP process in progress.  Bethena Roys in Hemodialysis to follow up when dialyis center established.    Will follow progress.    Lionel December, RN, BSN Phone [phone]

## 2015-04-09 NOTE — Progress Notes (Signed)
Renal Daily Progress Note  S: Hypoglycemia overnight (CBG: 55). Received AVF in left arm yesterday. Pain is controlled.  O:BP 158/77 mmHg  Pulse 78  Temp(Src) 99.6 F (37.6 C) (Oral)  Resp 18  Ht 5' 5"  (1.651 m)  Wt 218 lb 6.4 oz (99.066 kg)  BMI 36.34 kg/m2  SpO2 96%  Intake/Output Summary (Last 24 hours) at 04/09/15 0756 Last data filed at 04/09/15 3244  Gross per 24 hour  Intake    790 ml  Output   3650 ml  Net  -2860 ml   Intake/Output: I/O last 3 completed shifts: In: 1010 [P.O.:840; I.V.:170] Out: 0102 [Urine:2175; Other:2500]  Intake/Output this shift:    Weight change: -2.9kg at HD and no change overnight Gen: Conversant 59 y.o.male in no distress CV: Regular rate, no murmur, gallop or rub, symmetrical 2+ pitting LE edema Pulm: Non-labored breathing ambient air, CTAB, no wheezes or crackles GI: +BS, soft, non-tender, non-distended Skin: No rashes or bruising Access: tunneled catheter. L brachiocephalic AVF with palpable thrill. Incisions glued - clean and dry - hand warm, good radial pulse, good grip   Recent Labs Lab 04/04/15 0558 04/05/15 0555 04/06/15 0552 04/06/15 1457 04/07/15 0406 04/08/15 0445 04/09/15 0355  NA 133* 134* 133* 133* 133* 134* 136  K 5.0 5.4* 4.6 4.6 4.8 4.0 3.9  CL 92* 93* 95* 95* 95* 95* 99*  CO2 30 31 29 29 28 30 29   GLUCOSE 103* 97 152* 162* 157* 160* 79  BUN 59* 63* 48* 49* 52* 32* 19  CREATININE 6.92* 6.91* 6.03* 6.21* 6.01* 4.36* 3.31*  ALBUMIN 2.0* 2.0* 1.9* 1.9* 1.9* 1.8* 1.8*  CALCIUM 8.2* 8.8 8.2* 8.1* 8.2* 8.3* 8.0*  PHOS 7.7* 7.6* 6.2* 6.4* 6.4* 4.7* 3.6  CBC:  Recent Labs Lab 04/05/15 2117 04/06/15 1457 04/07/15 0406 04/08/15 0445 04/09/15 0355  WBC 6.9 7.5 7.0 5.9 5.6  HGB 7.3* 6.8* 7.5* 7.3* 7.3*  HCT 23.0* 21.4* 23.5* 22.5* 22.8*  MCV 87.8 88.8 88.7 90.7 90.5  PLT 299 291 269 261 256    CBG:  Recent Labs Lab 04/08/15 1429 04/08/15 1614 04/08/15 2113 04/09/15 0609 04/09/15 0644  GLUCAP 83 83 136*  55* 84   No results found. . sodium chloride   Intravenous Once  . calcium acetate  1,334 mg Oral TID WC  . darbepoetin (ARANESP) injection - DIALYSIS  100 mcg Intravenous Q Tue-HD  . ferumoxytol  510 mg Intravenous Once  . furosemide  120 mg Oral BID  . gabapentin  300 mg Oral QHS  . heparin  5,000 Units Subcutaneous 3 times per day  . hydrALAZINE  50 mg Oral BID  . hydrocerin   Topical BID  . insulin aspart  0-9 Units Subcutaneous TID WC  . isosorbide mononitrate  60 mg Oral Daily  . metoprolol tartrate  50 mg Oral BID WC  . senna-docusate  2 tablet Oral BID  . sodium chloride  3 mL Intravenous Q12H   71M with hx/o DM2 and HTN admitted with nephrotic proteinuria, AKI, positive M-Spike on SPEP and K:L sFLC 3.78. Found to have LVH and dCHF. Renal Bx with collapsing FSGS explaining proteinuric rapid progression of CKD. Bone marrow biopsy with 11% plasma cells consistent with IgG Kappa multiple myeloma  A/P 1. New ESRD due to collapsing FSGS. M-spike on SPEP and sFLC K:L 3.78. ANCA, ASO, HIV, GBM Ab, HCV, HBV, ANA, dsDNA negative. C3 and &C4 WNL.  1. Renal Bx 4/27 with collapsing FSGS 1. Prelim report with  no evidence of monoclonal process 2. Collapsing FSGS is completely explanatory of renal course 3. PT NOW ESRD: CLIP process underway, needs emergency medicaid # today.  4. Lives in [place], so will likely dialyze in [place].  5. AVF placed LUE 5/3 6. Next HD 5/5 7. EDW not yet established. Weight down about 9 kg since start of HD and still with edema  2. Continue diuretics to help with volume, change to po, expect UOP will dwindle off eventually 3. HD #3 yesterday: weight down ~ 40 lbs since admission 2. Multiple Myeloma s/p BM Bx 4/27 with 11% plasma cells., Oncology has seen and is "on the fence" with regard to treatment of MM. He does not have evidence for monoclonal process on his kidney biopsy.  3. HTN: stable on reduced anti-hypertensives, ARB held. May reduce/remove  meds for pressure to remove more volume. 4. DM2: unclear history 5. Hyperphosphatemia: cont PhosLo 2qAC. PTH only 118. Calcitriol stopped. (Goal PTH for Stage 5 CKD 150-300) 6. Anemia: Aranesp started. 100 QTues. Got Feraheme for Fe deficiency component  James [name]   I have seen and examined this patient and agree with plan and assessment in above excellent note of Dr. Bonner Puna with highlighted additions. Pt has new left RC-AVF (5/3) and TDC (4/29 IR) and CLIP process underway. Once he has a pending Medicaid number (financial supposed to meet with him today, but had not yet as of AM rounds) can get dialysis placement. Next HD tomorrow.  Hypoglycemia yesterday and today likely related to NPO status yesterday for procedure and missed meals. Levimir has been reduced. Still no word as to plan for his MM. Touching base with Dr. Marin Olp today.    James Maes, MD [facility] [phone] Pager 04/09/2015, 11:19 AM

## 2015-04-09 NOTE — Progress Notes (Signed)
Palpable thrill in fistula Incision intact No steal  Will sign off Follow up 1 month  Ruta Hinds, MD [facility] Office: [phone] Pager: [phone]

## 2015-04-09 NOTE — Progress Notes (Signed)
Hypoglycemic Event  CBG: 55  Treatment: 15 GM carbohydrate snack  Symptoms: None  Follow-up CBG: Time:6:45 AM      CBG Result:84  Possible Reasons for Event: Unknown  Comments/MD notified: Schorr, K -on-call hosptialist via text page. Pt Asymptomatic. Pt refused night-time snack. Educated patient the importance of having snack at bedtime to prevent hypoglycemic event in the morning.    James [name]  Remember to initiate Hypoglycemia Order Set & complete

## 2015-04-09 NOTE — Progress Notes (Signed)
James [name]   DOB:[birth year]   KV#:[id number]   FIE#:[id number]  No care team member to display  Subjective: No new issues overnight. He got AVF on 5/3, working properly. No respiratory or cardiac complaints. Pain controlled.   Scheduled Meds: . sodium chloride   Intravenous Once  . calcium acetate  1,334 mg Oral TID WC  . darbepoetin (ARANESP) injection - DIALYSIS  100 mcg Intravenous Q Tue-HD  . ferumoxytol  510 mg Intravenous Once  . furosemide  120 mg Oral BID  . gabapentin  300 mg Oral QHS  . heparin  5,000 Units Subcutaneous 3 times per day  . hydrALAZINE  50 mg Oral BID  . hydrocerin   Topical BID  . insulin aspart  0-9 Units Subcutaneous TID WC  . isosorbide mononitrate  60 mg Oral Daily  . metoprolol tartrate  50 mg Oral BID WC  . senna-docusate  2 tablet Oral BID  . sodium chloride  3 mL Intravenous Q12H   Continuous Infusions:  PRN Meds:sodium chloride, acetaminophen, hydrALAZINE, ondansetron (ZOFRAN) IV, oxyCODONE-acetaminophen, sodium chloride   Objective:  Filed Vitals:   04/09/15 0924  BP: 120/58  Pulse: 83  Temp:   Resp: 18      Intake/Output Summary (Last 24 hours) at 04/09/15 1447 Last data filed at 04/09/15 0929  Gross per 24 hour  Intake    740 ml  Output   1150 ml  Net   -410 ml      GENERAL:alert, no distress and comfortable SKIN: skin color, texture, turgor are normal, no rashes or significant lesions EYES: normal, conjunctiva are pink and non-injected, sclera clear OROPHARYNX:no exudate, no erythema and lips, buccal mucosa, and tongue normal  NECK: supple, thyroid normal size, non-tender, without nodularity LYMPH:  no palpable lymphadenopathy in the cervical, axillary or inguinal LUNGS: clear to auscultation and percussion with normal breathing effort HEART: regular rate & rhythm and no murmurs and 1-2+ lower extremity edema. Left AVF present ABDOMEN: soft, non-tender and normal bowel sounds Musculoskeletal:no cyanosis of digits and no  clubbing  PSYCH: alert & oriented x 3 with fluent speech NEURO: no focal motor/sensory deficits    CBG (last 3)   Recent Labs  04/08/15 2113 04/09/15 0609 04/09/15 0644  GLUCAP 136* 55* 84     Labs:   Recent Labs Lab 04/05/15 2117 04/06/15 1457 04/07/15 0406 04/08/15 0445 04/09/15 0355  WBC 6.9 7.5 7.0 5.9 5.6  HGB 7.3* 6.8* 7.5* 7.3* 7.3*  HCT 23.0* 21.4* 23.5* 22.5* 22.8*  PLT 299 291 269 261 256  MCV 87.8 88.8 88.7 90.7 90.5  MCH 27.9 28.2 28.3 29.4 29.0  MCHC 31.7 31.8 31.9 32.4 32.0  RDW 14.9 15.0 14.9 14.9 14.9     Chemistries:    Recent Labs Lab 04/06/15 0552 04/06/15 1457 04/07/15 0406 04/08/15 0445 04/09/15 0355  NA 133* 133* 133* 134* 136  K 4.6 4.6 4.8 4.0 3.9  CL 95* 95* 95* 95* 99*  CO2 29 29 28 30 29   GLUCOSE 152* 162* 157* 160* 79  BUN 48* 49* 52* 32* 19  CREATININE 6.03* 6.21* 6.01* 4.36* 3.31*  CALCIUM 8.2* 8.1* 8.2* 8.3* 8.0*    GFR Estimated Creatinine Clearance: 26.6 mL/min (by C-G formula based on Cr of 3.31).  Liver Function Tests:  Recent Labs Lab 04/06/15 0552 04/06/15 1457 04/07/15 0406 04/08/15 0445 04/09/15 0355  ALBUMIN 1.9* 1.9* 1.9* 1.8* 1.8*   No results for input(s): LIPASE, AMYLASE in the last 168 hours. No results for  input(s): AMMONIA in the last 168 hours.  Urine Studies     Component Value Date/Time   COLORURINE STRAW* 03/23/2015 0058   APPEARANCEUR CLEAR 03/23/2015 0058   LABSPEC 1.009 03/23/2015 0058   PHURINE 6.5 03/23/2015 0058   GLUCOSEU NEGATIVE 03/23/2015 0058   HGBUR MODERATE* 03/23/2015 0058   BILIRUBINUR NEGATIVE 03/23/2015 0058   KETONESUR NEGATIVE 03/23/2015 0058   PROTEINUR 100* 03/23/2015 0058   UROBILINOGEN 0.2 03/23/2015 0058   NITRITE NEGATIVE 03/23/2015 0058   LEUKOCYTESUR NEGATIVE 03/23/2015 0058    Coagulation profile No results for input(s): INR, PROTIME in the last 168 hours.  Cardiac Enzymes: No results for input(s): CKTOTAL, CKMB, CKMBINDEX, TROPONINI in the last  168 hours. BNP: Invalid input(s): POCBNP CBG:  Recent Labs Lab 04/08/15 1429 04/08/15 1614 04/08/15 2113 04/09/15 0609 04/09/15 0644  GLUCAP 83 83 136* 55* 84     Imaging Studies:  No results found.  Assessment/Plan: 59 y.o.  Multiple Myeloma S/p Bone marrow biopsy on 4/27 The pathology report ([id number]) showed 11% cells. The plasma cells had atypical features. The plasma cells were Kappa light chain restricted. M spike of 1.8 g/dL. His IgG level was 267 8 mg/dL. He had a normal IgA level but a markedly decreased IgM level. He had a Light chain in the serum of 113 mg/dL. 24-hour urine done which showed over 5700 mg of protein. I do not know how much Kappa light chain was in the urine. This may all be albumin area and however, he was found have a IgG Kappa component to the proteinuria bone survey was negative Renal Biopsy on 4/27 negative for monoclonal process   Anemia of iron deficiency Due to Iron deficiency and Renal Failure. No transfusion is indicated at this time Monitor counts closely Aranesp started at 100 mcg sq every Tuesday S/p Feraheme on 5/3 Transfuse blood to maintain a Hb of 8 g or if the patient is acutely bleeding  ESRD, new diagnosis Renal biopsy showed collapsing FSGS S/l AVF left arm on 5/3 HD as per Renal Service  Other medical issues as per primary team.  ADDENDUM:  I agree with the above. I just am not convinced that myeloma is the problem. His bone survey was negative. Again he only had 11% plasma cells. I guess we need to see what the cytogenetics are. Hopefully cytogenetics were sent off.  I think that his hypertension and diabetes is the most likely reason for his renal failure.  He does have some monoclonal protein. I would have to say that he probably has an MGUS. I'm not sure I could even said he has smoldering myeloma.  I you does need some type of outpatient follow-up.  He lives up in [place]. He might be easiest for him to follow-up  at our [facility] clinic with Dr. Loma Messing.  He is very nice.  Cindee Lame

## 2015-04-10 ENCOUNTER — Encounter (HOSPITAL_COMMUNITY): Payer: Self-pay

## 2015-04-10 LAB — GLUCOSE, CAPILLARY
GLUCOSE-CAPILLARY: 124 mg/dL — AB (ref 70–99)
GLUCOSE-CAPILLARY: 76 mg/dL (ref 70–99)
Glucose-Capillary: 105 mg/dL — ABNORMAL HIGH (ref 70–99)

## 2015-04-10 LAB — RENAL FUNCTION PANEL
Albumin: 1.8 g/dL — ABNORMAL LOW (ref 3.5–5.0)
Anion gap: 6 (ref 5–15)
BUN: 23 mg/dL — ABNORMAL HIGH (ref 6–20)
CHLORIDE: 99 mmol/L — AB (ref 101–111)
CO2: 30 mmol/L (ref 22–32)
Calcium: 8 mg/dL — ABNORMAL LOW (ref 8.9–10.3)
Creatinine, Ser: 3.96 mg/dL — ABNORMAL HIGH (ref 0.61–1.24)
GFR calc Af Amer: 18 mL/min — ABNORMAL LOW (ref >60)
GFR calc non Af Amer: 15 mL/min — ABNORMAL LOW (ref >60)
Glucose, Bld: 122 mg/dL — ABNORMAL HIGH (ref 70–99)
Phosphorus: 3.9 mg/dL (ref 2.5–4.6)
Potassium: 3.8 mmol/L (ref 3.5–5.1)
Sodium: 135 mmol/L (ref 135–145)

## 2015-04-10 MED ORDER — DARBEPOETIN ALFA 100 MCG/0.5ML IJ SOSY
PREFILLED_SYRINGE | INTRAMUSCULAR | Status: AC
  Filled 2015-04-10: qty 0.5

## 2015-04-10 MED ORDER — DARBEPOETIN ALFA 100 MCG/0.5ML IJ SOSY
100.0000 ug | PREFILLED_SYRINGE | INTRAMUSCULAR | Status: DC
  Administered 2015-04-10: 100 ug via INTRAVENOUS
  Filled 2015-04-10: qty 0.5

## 2015-04-10 NOTE — Progress Notes (Signed)
PT Cancellation Note  Patient Details Name: James [name] MRN: [id number] DOB: 1956/11/27   Cancelled Treatment:    Reason Eval/Treat Not Completed: Medical issues which prohibited therapy (In HD.  Will check back as able.  Thanks.)   Irwin Brakeman F 04/10/2015, 12:51 PM  [facility] [phone] (pager)

## 2015-04-10 NOTE — Progress Notes (Signed)
[place] dr Filbert Berthold about pt 's temp  99.8 100.1 . n o further order this time.

## 2015-04-10 NOTE — Anesthesia Postprocedure Evaluation (Signed)
  Anesthesia Post-op Note  Patient: James [name]  Procedure(s) Performed: Procedure(s): LEFT RADIOCEPHALIC ARTERIOVENOUS (AV) FISTULA CREATION (Left)  Patient Location: PACU  Anesthesia Type:MAC  Level of Consciousness: awake  Airway and Oxygen Therapy: Patient Spontanous Breathing  Post-op Pain: none  Post-op Assessment: Post-op Vital signs reviewed, Patient's Cardiovascular Status Stable, Respiratory Function Stable, Patent Airway, No signs of Nausea or vomiting and Pain level controlled  Post-op Vital Signs: Reviewed and stable  Last Vitals:  Filed Vitals:   04/10/15 1436  BP: 146/91  Pulse: 82  Temp: 37.7 C  Resp: 20    Complications: No apparent anesthesia complications

## 2015-04-10 NOTE — Progress Notes (Signed)
Renal Daily Progress Note  S: Pt remains in good spirits. No hypoglycemia overnight, eating well. Denies cold, numb, painful left hand.  O:BP 148/71 mmHg  Pulse 71  Temp(Src) 99.1 F (37.3 C) (Oral)  Resp 17  Ht 5' 5"  (1.651 m)  Wt 217 lb 4.8 oz (98.567 kg)  BMI 36.16 kg/m2  SpO2 97%  Intake/Output Summary (Last 24 hours) at 04/10/15 0802 Last data filed at 04/10/15 0617  Gross per 24 hour  Intake    677 ml  Output   1850 ml  Net  -1173 ml   Intake/Output: I/O last 3 completed shifts: In: 1157 [P.O.:940; I.V.:100; IV Piggyback:117] Out: 2550 [Urine:2550]  Intake/Output this shift:    Weight change: - 1 lbs.  Gen: Pleasant 59 y.o.male in no distress CV: Regular rate, no murmur, gallop or rub, symmetrical 2+ pitting LE edema, no truncal edema.  Pulm: Non-labored breathing ambient air, CTAB, no wheezes or crackles GI: +BS, soft, non-tender, non-distended Skin: No rashes or bruising Access: tunneled catheter. L radiocephalic AVF with palpable thrill: overlaying incisions c/d/i with glue. Bruit does diminish some moving up the forearm.Marland KitchenMarland KitchenRadial pulses symmetric and 2+, hand warm with good grip     Recent Labs Lab 04/05/15 0555 04/06/15 0552 04/06/15 1457 04/07/15 0406 04/08/15 0445 04/09/15 0355 04/10/15 0350  NA 134* 133* 133* 133* 134* 136 135  K 5.4* 4.6 4.6 4.8 4.0 3.9 3.8  CL 93* 95* 95* 95* 95* 99* 99*  CO2 31 29 29 28 30 29 30   GLUCOSE 97 152* 162* 157* 160* 79 122*  BUN 63* 48* 49* 52* 32* 19 23*  CREATININE 6.91* 6.03* 6.21* 6.01* 4.36* 3.31* 3.96*  ALBUMIN 2.0* 1.9* 1.9* 1.9* 1.8* 1.8* 1.8*  CALCIUM 8.8 8.2* 8.1* 8.2* 8.3* 8.0* 8.0*  PHOS 7.6* 6.2* 6.4* 6.4* 4.7* 3.6 3.9  CBC:  Recent Labs Lab 04/05/15 2117 04/06/15 1457 04/07/15 0406 04/08/15 0445 04/09/15 0355  WBC 6.9 7.5 7.0 5.9 5.6  HGB 7.3* 6.8* 7.5* 7.3* 7.3*  HCT 23.0* 21.4* 23.5* 22.5* 22.8*  MCV 87.8 88.8 88.7 90.7 90.5  PLT 299 291 269 261 256    CBG:  Recent Labs Lab  04/09/15 0609 04/09/15 0644 04/09/15 1632 04/09/15 2231 04/10/15 0614  GLUCAP 55* 84 110* 120* 124*   No results found. . sodium chloride   Intravenous Once  . calcium acetate  1,334 mg Oral TID WC  . darbepoetin (ARANESP) injection - DIALYSIS  100 mcg Intravenous Q Tue-HD  . furosemide  120 mg Oral BID  . gabapentin  300 mg Oral QHS  . heparin  5,000 Units Subcutaneous 3 times per day  . hydrALAZINE  50 mg Oral BID  . hydrocerin   Topical BID  . insulin aspart  0-9 Units Subcutaneous TID WC  . isosorbide mononitrate  60 mg Oral Daily  . metoprolol tartrate  50 mg Oral BID WC  . senna-docusate  2 tablet Oral BID  . sodium chloride  3 mL Intravenous Q12H   Background 79M with hx/o DM2 and HTN admitted with nephrotic proteinuria, AKI, positive M-Spike on SPEP and K:L sFLC 3.78. Found to have LVH and dCHF. Renal Bx with collapsing FSGS explaining proteinuric rapid progression of CKD. Bone marrow biopsy with 11% plasma cells consistent with IgG Kappa multiple myeloma  A/P 1. New ESRD due to collapsing FSGS. M-spike on SPEP and sFLC K:L 3.78. ANCA, ASO, HIV, GBM Ab, HCV, HBV, ANA, dsDNA negative. C3 and &C4 WNL.  1. Renal Bx  4/27 with collapsing FSGS which is completely explanatory of renal course 1. Prelim report with no evidence of monoclonal process 2. PT NOW ESRD: CLIP process underway, emergency medicaid paperwork completed 5/4  3. Lives in [place], so will likely dialyze in [place].  4. L RC AVF (5/3) and TDC (4/29 IR) 5. Next HD 5/5 6. EDW not yet established. Weight down about 9 kg since start of HD and still with edema  2. Continue diuretics to help with volume, change to po, expect UOP will dwindle off eventually 3. HD #3 yesterday: weight down ~ 40 lbs since admission 2. 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 We doubt this is significant contribution to renal failure. Oncology will follow up  cytogenetics and continue following at [facility] clinic with Dr. Ancil Linsey.  3. HTN: stable on reduced anti-hypertensives, ARB held. May reduce/remove meds for pressure to remove more volume. 4. DM2: unclear history 5. Hyperphosphatemia: cont PhosLo 2qAC. PTH only 118. Calcitriol stopped. (Goal PTH for Stage 5 CKD 150-300) 6. Anemia: Aranesp started. 100 QTues. Got Feraheme for Fe deficiency component.  Vance Gather   I have seen and examined this patient and agree with plan and assessment in the above excellent note of Dr. Bonner Puna. He is tolerating HD well. Holdup now to getting to an outpt HD center is getting his Medicaid number (doen't have yet), sending info to [facility] office (can't send without Medicaid number) and today told by SW there is some issue with getting Medicaid number for resident of [place]. Will ask our [place] worker how to work around this. Have left him on lasix because still responds and hopefully will help with his volume issues. Oncology does plan followup for his plasma cell dyscrasia at [facility].  Laci Frenkel B,MD 04/10/2015 11:26 AM

## 2015-04-10 NOTE — Progress Notes (Signed)
1530 Transfer in from  HD unit with Georgina Snell ,RN Pt in apparent distress.

## 2015-04-10 NOTE — Progress Notes (Signed)
PROGRESS NOTE    James [name] [id number] DOB: 07-Nov-1956 DOA: 03/22/2015 PCP: No primary care provider on file.  HPI/Brief narrative 59yo who presented with scrotal swelling and generalized edema. The patient was found to have elevated creatinine and evidence of heart failure.Cardiology was consulted and started IV lasix.Records demonstrated he had sharp rise in creatinine over the last few months.Nephrology was consulted.On his SPEP he had an M spike. It is possible that his acute kidney injury is secondary to multiple myeloma as opposed to hypertension and diabetes. Nephrology recommended pursuing bone marrow biopsy instead of kidney biopsy showed his confirmatory test such as IFE,/lambda light chain, beta-2 microglobulin return positive. Bone survery was negative. Discordant protein on urinalysis compared to total protein collected from 24-hour urine collection suspicious for multiple myeloma. Bone marrow and kidney biopsy on Wed.   Assessment/Plan:  Acute hypoxic respiratory failure secondary to acute on chronic systolic and diastolic congestive heart failure - 2-D echo with ejection fraction 45-50% with LV diastolic dysfunction - TSH within normal limits - Appreciate cardiology assistance, cardiology following prn, last note on 4/25 - Started HD on 4/30, Lasix per nephrology -  Resolved  New ESRD, due to collapsing FSGS - Nephrology follow-up appreciated - ANCA, ASO, HIV, GBM Ab, HCV, HBV, ANA, dsDNA negative. C3 and &C4 WNL.  - Renal biopsy 4/27 showed collapsing FSGS and no evidence of monoclonal process. - As per nephrology, patient now ESRD. Started hemodialysis 4/30 via a tunneled HD catheter placed on 4/29.  - Left upper extremity AVF placed 5/3 by [facility]. Dr. Darrick Penna signed off on 5/4-outpatient follow-up in 1 month - Still on high-dose IV Lasix per nephrology. EDW not yet established. - Awaiting emergency Medicaid and outpatient dialysis arrangement at  which time he could be discharged home.  Probable multiple myeloma  - Beta-2 microglobulin 8.3, cryoglobulin pending, upep IFE igG kappa, spep mspike 1.8%, SFL kappa 113 k/l ratio 3.7 - IgG 2678, IgA 102, IgM <6 - Bone survey negative - Bone marrow biopsy 4/27: 11% plasma cell /keppa restricted, cytogenetics pending - Oncology consultation appreciated and they are "on the fence" with regard to treatment of multiple myeloma.  - No evidence of monoclonal process on renal biopsy - Oncology input 5/4 appreciated: They will follow up cytogenetics and outpatient follow-up at [facility] clinic with Dr. Loma Messing.  Diabetes mellitus type 2 with renal complications/hypoglycemia, - Hemoglobin A1c: 7.2 - Hypoglycemic CBG 67 mg per DL 5/3 AM. Possibly from nothing by mouth status.  - Reduced Levemir from 12 to 10 units QHS on 5/3 - continue SSI - again hypoglycemic 5/4 AM- DC'ed Levemir. Monitor - Seem to have resolved.  Essential hypertension - Controlled on metoprolol, hydralazine and Imdur  -ARB held due to AKI - May need to reduce meds as blood pressure improves with volume offloading across dialysis.  Anemia - Folate/ b12/tsh wnl, epo level pending, FOBT pending, does has iron deficiency, Started iron supplementation - Avoid epo for now while working up MM -  hgb dropped below 7 on 5/1, 1unit prbc given.  - As per oncology, anemia felt to be secondary to Epo deficiency and nephrology have started Aranesp.  - Patient s/p Feraheme 5/2 - Follow CBC and transfuse if hemoglobin less than 7 g per DL.  Secondary hyperparathyroidism - Started on calcitriol-discontinued by nephrology due to PTH only 118   Code Status: Full Family Communication: Discussed with mother on 04/09/2015. None at bedside today. Disposition Plan: Home when medically stable  Consultants:  Cardiology  Nephrology  IR for [facility] & Renal Biopsy  Medical Oncology  [facility]  Procedures:  BM & Renal  Bx on 4/27  HD form 4/30   Tunneled dialysis catheter by IR 4/29  LUE AVF 5/3  Antibiotics:  None  Subjective: No complaints reported. No acute events as per nursing.  Objective: Filed Vitals:   04/10/15 1230 04/10/15 1300 04/10/15 1436 04/10/15 1601  BP: 148/92 150/90 146/91 145/77  Pulse: 80 80 82 80  Temp:   99.8 F (37.7 C) 100.1 F (37.8 C)  TempSrc:   Oral Oral  Resp: 20 18 20 18   Height:      Weight:   94.5 kg (208 lb 5.4 oz)   SpO2:    96%    Intake/Output Summary (Last 24 hours) at 04/10/15 1608 Last data filed at 04/10/15 1436  Gross per 24 hour  Intake    577 ml  Output   4950 ml  Net  -4373 ml   Filed Weights   04/10/15 0638 04/10/15 1030 04/10/15 1436  Weight: 98.567 kg (217 lb 4.8 oz) 98.7 kg (217 lb 9.5 oz) 94.5 kg (208 lb 5.4 oz)     Exam:  General exam: Pleasant middle-aged male lying comfortably supine in bed. Respiratory system: Clear. No increased work of breathing. Cardiovascular system: S1 & S2 heard, RRR. No JVD, murmurs, gallops, clicks or pedal edema. Not on telemetry. Gastrointestinal system: Abdomen is nondistended, soft and nontender. Normal bowel sounds heard. Central nervous system: Alert and oriented. No focal neurological deficits. Extremities: Symmetric 5 x 5 power.   Data Reviewed: Basic Metabolic Panel:  Recent Labs Lab 04/06/15 1457 04/07/15 0406 04/08/15 0445 04/09/15 0355 04/10/15 0350  NA 133* 133* 134* 136 135  K 4.6 4.8 4.0 3.9 3.8  CL 95* 95* 95* 99* 99*  CO2 29 28 30 29 30   GLUCOSE 162* 157* 160* 79 122*  BUN 49* 52* 32* 19 23*  CREATININE 6.21* 6.01* 4.36* 3.31* 3.96*  CALCIUM 8.1* 8.2* 8.3* 8.0* 8.0*  PHOS 6.4* 6.4* 4.7* 3.6 3.9   Liver Function Tests:  Recent Labs Lab 04/06/15 1457 04/07/15 0406 04/08/15 0445 04/09/15 0355 04/10/15 0350  ALBUMIN 1.9* 1.9* 1.8* 1.8* 1.8*   No results for input(s): LIPASE, AMYLASE in the last 168 hours. No results for input(s): AMMONIA in the last 168  hours. CBC:  Recent Labs Lab 04/05/15 2117 04/06/15 1457 04/07/15 0406 04/08/15 0445 04/09/15 0355  WBC 6.9 7.5 7.0 5.9 5.6  HGB 7.3* 6.8* 7.5* 7.3* 7.3*  HCT 23.0* 21.4* 23.5* 22.5* 22.8*  MCV 87.8 88.8 88.7 90.7 90.5  PLT 299 291 269 261 256   Cardiac Enzymes: No results for input(s): CKTOTAL, CKMB, CKMBINDEX, TROPONINI in the last 168 hours. BNP (last 3 results) No results for input(s): PROBNP in the last 8760 hours. CBG:  Recent Labs Lab 04/09/15 0609 04/09/15 0644 04/09/15 1632 04/09/15 2231 04/10/15 0614  GLUCAP 55* 84 110* 120* 124*    No results found for this or any previous visit (from the past 240 hour(s)).        Studies: No results found.      Scheduled Meds: . sodium chloride   Intravenous Once  . calcium acetate  1,334 mg Oral TID WC  . Darbepoetin Alfa      . darbepoetin (ARANESP) injection - DIALYSIS  100 mcg Intravenous Q Thu-HD  . furosemide  120 mg Oral BID  . gabapentin  300 mg Oral QHS  .  heparin  5,000 Units Subcutaneous 3 times per day  . hydrALAZINE  50 mg Oral BID  . hydrocerin   Topical BID  . insulin aspart  0-9 Units Subcutaneous TID WC  . isosorbide mononitrate  60 mg Oral Daily  . metoprolol tartrate  50 mg Oral BID WC  . senna-docusate  2 tablet Oral BID  . sodium chloride  3 mL Intravenous Q12H   Continuous Infusions:   Principal Problem:   CHF (congestive heart failure) Active Problems:   Peripheral edema   HTN (hypertension)   DM (diabetes mellitus)   Acute renal failure   Anemia   Hypokalemia   Congestive heart disease   Hypoxia   Acute on chronic diastolic CHF (congestive heart failure), NYHA class 4   Multiple myeloma    Time spent: 20 minutes    Carmelite Violet, MD, FACP, FHM. [facility] Pager [phone]  If 7PM-7AM, please contact night-coverage [email or web address] Password [email or web address] 04/10/2015, 4:08 PM    LOS: 19 days

## 2015-04-10 NOTE — Procedures (Signed)
I have personally attended this patient's dialysis session.  TDC running at 400 2K bath - K 3.8 - change to 4K bath  Pre HD weight 98.7 kg still with peripheral edema Still working towards a dry weight   Jamal Maes, MD [place] Pager 04/10/2015, 11:22 AM

## 2015-04-11 ENCOUNTER — Telehealth: Payer: Self-pay | Admitting: Vascular Surgery

## 2015-04-11 DIAGNOSIS — R509 Fever, unspecified: Secondary | ICD-10-CM

## 2015-04-11 LAB — RENAL FUNCTION PANEL
ANION GAP: 6 (ref 5–15)
Albumin: 1.9 g/dL — ABNORMAL LOW (ref 3.5–5.0)
BUN: 12 mg/dL (ref 6–20)
CALCIUM: 8.1 mg/dL — AB (ref 8.9–10.3)
CHLORIDE: 100 mmol/L — AB (ref 101–111)
CO2: 29 mmol/L (ref 22–32)
Creatinine, Ser: 2.97 mg/dL — ABNORMAL HIGH (ref 0.61–1.24)
GFR calc Af Amer: 25 mL/min — ABNORMAL LOW (ref >60)
GFR calc non Af Amer: 22 mL/min — ABNORMAL LOW (ref >60)
GLUCOSE: 84 mg/dL (ref 70–99)
PHOSPHORUS: 2.9 mg/dL (ref 2.5–4.6)
Potassium: 3.9 mmol/L (ref 3.5–5.1)
SODIUM: 135 mmol/L (ref 135–145)

## 2015-04-11 LAB — GLUCOSE, CAPILLARY
GLUCOSE-CAPILLARY: 140 mg/dL — AB (ref 70–99)
Glucose-Capillary: 88 mg/dL (ref 70–99)
Glucose-Capillary: 115 mg/dL — ABNORMAL HIGH (ref 70–99)
Glucose-Capillary: 134 mg/dL — ABNORMAL HIGH (ref 70–99)

## 2015-04-11 LAB — CBC
HCT: 24.4 % — ABNORMAL LOW (ref 39.0–52.0)
HEMOGLOBIN: 7.7 g/dL — AB (ref 13.0–17.0)
MCH: 28.5 pg (ref 26.0–34.0)
MCHC: 31.6 g/dL (ref 30.0–36.0)
MCV: 90.4 fL (ref 78.0–100.0)
Platelets: 292 10*3/uL (ref 150–400)
RBC: 2.7 MIL/uL — AB (ref 4.22–5.81)
RDW: 14.7 % (ref 11.5–15.5)
WBC: 5.8 10*3/uL (ref 4.0–10.5)

## 2015-04-11 MED ORDER — HYDRALAZINE HCL 25 MG PO TABS
25.0000 mg | ORAL_TABLET | Freq: Two times a day (BID) | ORAL | Status: DC
  Administered 2015-04-11 – 2015-04-12 (×2): 25 mg via ORAL
  Filled 2015-04-11 (×3): qty 1

## 2015-04-11 NOTE — Telephone Encounter (Signed)
-----   Message from Mena Goes, RN sent at 04/09/2015  9:12 AM EDT ----- Regarding: Schedule   ----- Message -----    From: Alvia Grove, PA-C    Sent: 04/09/2015   9:07 AM      To: [facility] Charge Pool  S/p left RC-AVF 04/08/15  F/u in 4 weeks with Dr. Oneida Alar. No duplex.   Thanks. Maudie Mercury

## 2015-04-11 NOTE — Progress Notes (Signed)
Nutrition Education Note  RD consulted for Renal Education. Provided Choose-A-Meal Booklet to patient/family. Reviewed food groups and provided written recommended serving sizes specifically determined for patient's current nutritional status.   Explained why diet restrictions are needed and provided lists of foods to limit/avoid that are high potassium, sodium, and phosphorus. Provided specific recommendations on safer alternatives of these foods. Strongly encouraged compliance of this diet.   Discussed importance of protein intake at each meal and snack. Provided examples of how to maximize protein intake throughout the day. Discussed need for fluid restriction with dialysis, importance of minimizing weight gain between HD treatments, and renal-friendly beverage options. Discussed thirst-quenching tips.  Encouraged pt to discuss specific diet questions/concerns with RD at HD outpatient facility. Teach back method used.  Expect good compliance.  Body mass index is 34.05 kg/(m^2). Pt meets criteria for Obesity based on current BMI.  Current diet order is Renal/Carb Modified, patient is consuming approximately 100% of meals at this time. Labs and medications reviewed. No further nutrition interventions warranted at this time. RD contact information provided. If additional nutrition issues arise, please re-consult RD.  Pryor Ochoa RD, LDN Inpatient Clinical Dietitian Pager: [phone] After Hours Pager: [phone]

## 2015-04-11 NOTE — Evaluation (Signed)
Physical Therapy Evaluation Patient Details Name: James [name] MRN: [id number] DOB: 05-30-1956 Today's Date: 04/11/2015   History of Present Illness  Cynthia Challender is a 59 y.o. male who presented with B/L LE edema and groin swelling. He was found to have acute on chronic CHF, AKI-Nephrotic syndrome, elevated M spike and concern for multiple myeloma  Clinical Impression  Pt very pleasant, up and moving around in room on arrival. Pt reaching for environmental supports and unsteady gait with activity. Pt educated for balance deficits, fall risk and need to improve function to increase independence and stability. Pt will benefit from acute therapy to maximize mobility, gait, balance and independence to decrease burden of care.     Follow Up Recommendations Outpatient PT    Equipment Recommendations  Cane    Recommendations for Other Services       Precautions / Restrictions Precautions Precautions: Fall      Mobility  Bed Mobility Overal bed mobility: Modified Independent                Transfers Overall transfer level: Modified independent                  Ambulation/Gait Ambulation/Gait assistance: Min guard Ambulation Distance (Feet): 400 Feet Assistive device: None Gait Pattern/deviations: Scissoring;Step-through pattern   Gait velocity interpretation: Below normal speed for age/gender General Gait Details: initial 100' pt with very unsteady gait, scissoring, reaching for environmental supports, With increased distance gait improved  but continued to require guarding for balance and safety  Stairs            Wheelchair Mobility    Modified Rankin (Stroke Patients Only)       Balance Overall balance assessment: Needs assistance   Sitting balance-Leahy Scale: Good       Standing balance-Leahy Scale: Fair   Single Leg Stance - Right Leg: 2 Single Leg Stance - Left Leg: 2 Tandem Stance - Right Leg: 3 Tandem Stance - Left Leg: 3 Rhomberg  - Eyes Opened: 60 Rhomberg - Eyes Closed: 20   High Level Balance Comments: pt unable to walk and multitask fidgeting with gown with LOB             Pertinent Vitals/Pain Pain Assessment: No/denies pain    Home Living Family/patient expects to be discharged to:: Private residence Living Arrangements: Other relatives Available Help at Discharge: Family;Available 24 hours/day Type of Home: House Home Access: Stairs to enter Entrance Stairs-Rails: None Entrance Stairs-Number of Steps: 2 Home Layout: One level Home Equipment: None      Prior Function Level of Independence: Independent         Comments: pt was still working fulltime at at [employer]        Extremity/Trunk Assessment   Upper Extremity Assessment: Overall WFL for tasks assessed           Lower Extremity Assessment: Overall WFL for tasks assessed      Cervical / Trunk Assessment: Normal  Communication   Communication: No difficulties  Cognition Arousal/Alertness: Awake/alert Behavior During Therapy: WFL for tasks assessed/performed Overall Cognitive Status: Within Functional Limits for tasks assessed                      General Comments      Exercises        Assessment/Plan    PT Assessment Patient needs continued PT services  PT Diagnosis Abnormality of gait   PT Problem  List Decreased strength;Decreased activity tolerance;Decreased balance;Decreased mobility  PT Treatment Interventions Gait training;DME instruction;Balance training;Functional mobility training;Patient/family education   PT Goals (Current goals can be found in the Care Plan section) Acute Rehab PT Goals Patient Stated Goal: return home and to work PT Goal Formulation: With patient Time For Goal Achievement: 04/25/15 Potential to Achieve Goals: Fair    Frequency Min 3X/week   Barriers to discharge   pt reports one brother doesn't work and is home all the time while the other  brother works    Co-evaluation               End of Journalist, newspaper During Treatment: Gait belt Activity Tolerance: Patient tolerated treatment well Patient left: in chair;with call bell/phone within reach Nurse Communication: Mobility status         Time: 6440-3474 PT Time Calculation (min) (ACUTE ONLY): 15 min   Charges:   PT Evaluation $Initial PT Evaluation Tier I: 1 Procedure     PT G CodesDelorse Lek 04/11/2015, 12:20 PM Delaney Meigs, PT [phone]

## 2015-04-11 NOTE — Progress Notes (Signed)
PROGRESS NOTE    James [name] WUJ:[id number] DOB: August 12, 1956 DOA: 03/22/2015 PCP: No primary care provider on file.  HPI/Brief narrative 59yo who presented with scrotal swelling and generalized edema. The patient was found to have elevated creatinine and evidence of heart failure.Cardiology was consulted and started IV lasix.Records demonstrated he had sharp rise in creatinine over the last few months.Nephrology was consulted.On his SPEP he had an M spike. It is possible that his acute kidney injury is secondary to multiple myeloma as opposed to hypertension and diabetes. Nephrology recommended pursuing bone marrow biopsy instead of kidney biopsy showed his confirmatory test such as IFE,/lambda light chain, beta-2 microglobulin return positive. Bone survery was negative. Discordant protein on urinalysis compared to total protein collected from 24-hour urine collection suspicious for multiple myeloma. Bone marrow and kidney biopsy on Wed.   Assessment/Plan:  Acute hypoxic respiratory failure secondary to acute on chronic systolic and diastolic congestive heart failure - 2-D echo with ejection fraction 45-50% with LV diastolic dysfunction - TSH within normal limits - Appreciate cardiology assistance, cardiology following prn, last note on 4/25 - Started HD on 4/30, Lasix per nephrology -  Resolved  New ESRD, due to collapsing FSGS - Nephrology follow-up appreciated - ANCA, ASO, HIV, GBM Ab, HCV, HBV, ANA, dsDNA negative. C3 and &C4 WNL.  - Renal biopsy 4/27 showed collapsing FSGS and no evidence of monoclonal process. - As per nephrology, patient now ESRD. Started hemodialysis 4/30 via a tunneled HD catheter placed on 4/29.  - Left upper extremity AVF placed 5/3 by VVS. Dr. Darrick Penna signed off on 5/4-outpatient follow-up in 1 month - Still on high-dose IV Lasix per nephrology. EDW not yet established. - Have Medicaid and now pending outpatient dialysis arrangement at which  time he could be discharged home.  Probable multiple myeloma  - Beta-2 microglobulin 8.3, cryoglobulin pending, upep IFE igG kappa, spep mspike 1.8%, SFL kappa 113 k/l ratio 3.7 - IgG 2678, IgA 102, IgM <6 - Bone survey negative - Bone marrow biopsy 4/27: 11% plasma cell /keppa restricted, cytogenetics pending - Oncology consultation appreciated and they are "on the fence" with regard to treatment of multiple myeloma.  - No evidence of monoclonal process on renal biopsy - Oncology input 5/4 appreciated: They will follow up cytogenetics and outpatient follow-up at [facility] clinic with Dr. Loma Messing.  Diabetes mellitus type 2 with renal complications/hypoglycemia, - Hemoglobin A1c: 7.2 - Hypoglycemic CBG 67 mg per DL 5/3 AM. Possibly from nothing by mouth status.  - Reduced Levemir from 12 to 10 units QHS on 5/3 - continue SSI - again hypoglycemic 5/4 AM- DC'ed Levemir. Monitor - Seem to have resolved.  Essential hypertension - Controlled on metoprolol, hydralazine and Imdur  -ARB held due to AKI - Meds being titrated down by Renal.  Anemia - Folate/ b12/tsh wnl, epo level pending, FOBT pending, does has iron deficiency, Started iron supplementation - Avoid epo for now while working up MM -  hgb dropped below 7 on 5/1, 1unit prbc given.  - As per oncology, anemia felt to be secondary to Epo deficiency and nephrology have started Aranesp.  - Patient s/p Feraheme 5/2 - Follow CBC and transfuse if hemoglobin less than 7 g per DL.  Secondary hyperparathyroidism - Started on calcitriol-discontinued by nephrology due to PTH only 118  Low-grade fever - Noticed over the last 24 hours. - Patient denies any symptoms suggestive of source - No source identified on clinical exam - Monitor closely and if develops  high fever, we will need workup and probably initiation of antibiotics.   Code Status: Full Family Communication: Discussed with mother on 04/09/2015. None at  bedside today. Disposition Plan: Home pending OP HD arrangement   Consultants:  Cardiology  Nephrology  IR for [facility] & Renal Biopsy  Medical Oncology  VVS  Procedures:  BM & Renal Bx on 4/27  HD form 4/30   Tunneled dialysis catheter by IR 4/29  LUE AVF 5/3  Antibiotics:  None  Subjective: No complaints reported. Denies cough, sore throat, headache, earache, nausea, vomiting, abdominal pain, diarrhea, dysuria or urinary frequency.  Objective: Filed Vitals:   04/11/15 2952 04/11/15 0649 04/11/15 0824 04/11/15 1400  BP: 156/76  163/68 146/91  Pulse: 85   85  Temp: 100.5 F (38.1 C)   98.7 F (37.1 C)  TempSrc: Oral   Oral  Resp: 17   16  Height:      Weight:  92.806 kg (204 lb 9.6 oz)    SpO2: 97%  95% 96%    Intake/Output Summary (Last 24 hours) at 04/11/15 1604 Last data filed at 04/11/15 1400  Gross per 24 hour  Intake   1190 ml  Output   2400 ml  Net  -1210 ml   Filed Weights   04/10/15 1030 04/10/15 1436 04/11/15 0649  Weight: 98.7 kg (217 lb 9.5 oz) 94.5 kg (208 lb 5.4 oz) 92.806 kg (204 lb 9.6 oz)     Exam:  General exam: Pleasant middle-aged male sitting up comfortably at edge of bed this morning. Does not look septic or toxic. Respiratory system: Clear. No increased work of breathing. Cardiovascular system: S1 & S2 heard, RRR. No JVD, murmurs, gallops, clicks or pedal edema. Not on telemetry. Gastrointestinal system: Abdomen is nondistended, soft and nontender. Normal bowel sounds heard. Central nervous system: Alert and oriented. No focal neurological deficits. Extremities: Symmetric 5 x 5 power.   Data Reviewed: Basic Metabolic Panel:  Recent Labs Lab 04/07/15 0406 04/08/15 0445 04/09/15 0355 04/10/15 0350 04/11/15 0717  NA 133* 134* 136 135 135  K 4.8 4.0 3.9 3.8 3.9  CL 95* 95* 99* 99* 100*  CO2 28 30 29 30 29   GLUCOSE 157* 160* 79 122* 84  BUN 52* 32* 19 23* 12  CREATININE 6.01* 4.36* 3.31* 3.96* 2.97*  CALCIUM 8.2* 8.3*  8.0* 8.0* 8.1*  PHOS 6.4* 4.7* 3.6 3.9 2.9   Liver Function Tests:  Recent Labs Lab 04/07/15 0406 04/08/15 0445 04/09/15 0355 04/10/15 0350 04/11/15 0717  ALBUMIN 1.9* 1.8* 1.8* 1.8* 1.9*   No results for input(s): LIPASE, AMYLASE in the last 168 hours. No results for input(s): AMMONIA in the last 168 hours. CBC:  Recent Labs Lab 04/06/15 1457 04/07/15 0406 04/08/15 0445 04/09/15 0355 04/11/15 1206  WBC 7.5 7.0 5.9 5.6 5.8  HGB 6.8* 7.5* 7.3* 7.3* 7.7*  HCT 21.4* 23.5* 22.5* 22.8* 24.4*  MCV 88.8 88.7 90.7 90.5 90.4  PLT 291 269 261 256 292   Cardiac Enzymes: No results for input(s): CKTOTAL, CKMB, CKMBINDEX, TROPONINI in the last 168 hours. BNP (last 3 results) No results for input(s): PROBNP in the last 8760 hours. CBG:  Recent Labs Lab 04/10/15 0614 04/10/15 1651 04/10/15 2131 04/11/15 0553 04/11/15 1104  GLUCAP 124* 76 105* 88 115*    No results found for this or any previous visit (from the past 240 hour(s)).        Studies: No results found.      Scheduled Meds: .  sodium chloride   Intravenous Once  . calcium acetate  1,334 mg Oral TID WC  . darbepoetin (ARANESP) injection - DIALYSIS  100 mcg Intravenous Q Thu-HD  . furosemide  120 mg Oral BID  . gabapentin  300 mg Oral QHS  . heparin  5,000 Units Subcutaneous 3 times per day  . hydrALAZINE  25 mg Oral BID  . hydrocerin   Topical BID  . insulin aspart  0-9 Units Subcutaneous TID WC  . isosorbide mononitrate  60 mg Oral Daily  . metoprolol tartrate  50 mg Oral BID WC  . senna-docusate  2 tablet Oral BID  . sodium chloride  3 mL Intravenous Q12H   Continuous Infusions:   Principal Problem:   CHF (congestive heart failure) Active Problems:   Peripheral edema   HTN (hypertension)   DM (diabetes mellitus)   Acute renal failure   Anemia   Hypokalemia   Congestive heart disease   Hypoxia   Acute on chronic diastolic CHF (congestive heart failure), NYHA class 4   Multiple  myeloma    Time spent: 20 minutes    Keirah Konitzer, MD, FACP, FHM. [facility] Pager [phone]  If 7PM-7AM, please contact night-coverage [email or web address] Password [email or web address] 04/11/2015, 4:04 PM    LOS: 20 days

## 2015-04-11 NOTE — Progress Notes (Addendum)
Renal Daily Progress Note  S: Doing well, denies dysuria, cough, sputum, feeling febrile or with chills. Receiving dietary education. Has transportation to HD as outpatient.   O:Temp:  [98.4 F (36.9 C)-100.5 F (38.1 C)] 100.5 F (38.1 C) (05/06 0646) Pulse Rate:  [76-85] 85 (05/06 0646) Resp:  [15-20] 17 (05/06 0646) BP: (104-156)/(38-92) 156/76 mmHg (05/06 0646) SpO2:  [95 %-98 %] 97 % (05/06 0646) Weight:  [204 lb 9.6 oz (92.806 kg)-217 lb 9.5 oz (98.7 kg)] 204 lb 9.6 oz (92.806 kg) (05/06 0649)  Intake/Output Summary (Last 24 hours) at 04/11/15 0735 Last data filed at 04/11/15 0600  Gross per 24 hour  Intake    660 ml  Output   5150 ml  Net  -4490 ml   Intake/Output: I/O last 3 completed shifts: In: 900 [P.O.:900] Out: 5100 [Urine:1600; Other:3500] Weight change: - 4 lbs. from post dialysis weight (208 lbs)  Gen: Pleasant 59 y.o.male in no distress CV: Regular rate, no murmur, gallop or rub, symmetrical 2+ pitting LE edema, no truncal edema.  Pulm: Non-labored breathing ambient air, CTAB, no wheezes or crackles GI: +BS, soft, non-tender, non-distended Skin: No rashes or bruising Access: Tunneled catheter without eryhtmea, induration; dressing c/d/i. L radiocephalic AVF with bruit that diminishes proximally moving up the forearm without overlaying erythema or tenderness. Radial pulses symmetric and 2+, hand warm with good grip    Recent Labs Lab 04/05/15 0555 04/06/15 0552 04/06/15 1457 04/07/15 0406 04/08/15 0445 04/09/15 0355 04/10/15 0350  NA 134* 133* 133* 133* 134* 136 135  K 5.4* 4.6 4.6 4.8 4.0 3.9 3.8  CL 93* 95* 95* 95* 95* 99* 99*  CO2 _0 GLUCOSE 97 152* 162* 157* 160* 79 122*  BUN 63* 48* 49* 52* 32* 19 23*  CREATININE 6.91* 6.03* 6.21* 6.01* 4.36* 3.31* 3.96*  ALBUMIN 2.0* 1.9* 1.9* 1.9* 1.8* 1.8* 1.8*  CALCIUM 8.8 8.2* 8.1* 8.2* 8.3* 8.0* 8.0*  PHOS 7.6* 6.2* 6.4* 6.4* 4.7* 3.6 3.9  CBC:  Recent Labs Lab 04/05/15 2117  04/06/15 1457 04/07/15 0406 04/08/15 0445 04/09/15 0355  WBC 6.9 7.5 7.0 5.9 5.6  HGB 7.3* 6.8* 7.5* 7.3* 7.3*  HCT 23.0* 21.4* 23.5* 22.5* 22.8*  MCV 87.8 88.8 88.7 90.7 90.5  PLT 299 291 269 261 256    CBG:  Recent Labs Lab 04/09/15 2231 04/10/15 0614 04/10/15 1651 04/10/15 2131 04/11/15 0553  GLUCAP 120* 124* 76 105* 88   No results found. . sodium chloride   Intravenous Once  . calcium acetate  1,334 mg Oral TID WC  . darbepoetin (ARANESP) injection - DIALYSIS  100 mcg Intravenous Q Thu-HD  . furosemide  120 mg Oral BID  . gabapentin  300 mg Oral QHS  . heparin  5,000 Units Subcutaneous 3 times per day  . hydrALAZINE  50 mg Oral BID  . hydrocerin   Topical BID  . insulin aspart  0-9 Units Subcutaneous TID WC  . isosorbide mononitrate  60 mg Oral Daily  . metoprolol tartrate  50 mg Oral BID WC  . senna-docusate  2 tablet Oral BID  . sodium chloride  3 mL Intravenous Q12H   Background 22M with hx/o DM2 and HTN admitted with nephrotic proteinuria, AKI, positive M-Spike on SPEP and K:L sFLC 3.78. Found to have LVH and dCHF. Renal Bx with collapsing FSGS explaining proteinuric rapid progression of CKD to ESRD. Bone marrow biopsy with 11% plasma cells consistent with IgG Kappa multiple myeloma  A/P 1. New ESRD due to collapsing FSGS. M-spike on SPEP and sFLC K:L 3.78. ANCA, ASO, HIV, GBM Ab, HCV, HBV, ANA, dsDNA negative. C3 and &C4 WNL.  1. Renal Bx 4/27 with collapsing FSGS which is completely explanatory of renal course 1. Prelim report with no evidence of monoclonal process 2. CLIP process underway, emergency medicaid # still pending.  3. Lives in [place], so will likely dialyze in [place].  4. L RC AVF (5/3) and TDC (4/29 IR) 2. EDW not yet established: Edema continues to improve but remains. Weight down ~ 40 lbs since admission 3. Continue diuretics to help with volume, change to po, expect UOP will dwindle off eventually 4. Next HD 5/7 2. Low grade fever  overnight: Without signs or symptoms of infection, but concerning with hardware in place. Will keep a close eye on this.  3. Plasma cell dyscrasia s/p BM Bx 4/27 with 11% plasma cells: No evidence of renal monoclonal process. We doubt this is significant contribution to renal failure. Oncology will follow up cytogenetics and continue following at [place] clinic with Dr. Shannon Penland.  4. HTN: Will further reduce anti-hypertensives: Hydralazine 50mg BID -> 25mg BID. ARB held. May reduce/remove meds for pressure to remove more volume. 5. DM2: unclear history 6. Hyperphosphatemia: cont PhosLo 2qAC. PTH only 118. Calcitriol stopped. (Goal PTH for Stage 5 CKD 150-300) 7. Anemia: Aranesp started. 100 QTues. Got Feraheme for Fe deficiency component.  Ryan B. Grunz, MD, PGY-2 04/11/2015 7:42 AM   I have seen and examined this patient and agree with plan as outlined in the above excellent note of Dr. Grunz. He now has a pending Medicaid number and we are waiting to hear from the [facility] about treatment assignment.  Have written orders for treatment tomorrow,  Only concern is low grade fever around 100.5. No obvious site. TDC sites look OK, lungs clear, no dysuria, so symptoms. Watch, with low threshold for cultures. We should know about his outpt HD by the end of the day today.  , B,MD 04/11/2015 11:45 AM  Addendum: Pt has chair at [facility] TTS second shift and can start there on Tuesday He will have HD in the AM If he remains afebrile could then go home after HD  We have also scheduled him for Oncology followup at [facility] for next week.   , MD [facility] [phone] Pager 04/11/2015, 6:35 PM     

## 2015-04-11 NOTE — Telephone Encounter (Signed)
LM for pt re appt, dpm °

## 2015-04-12 DIAGNOSIS — N189 Chronic kidney disease, unspecified: Secondary | ICD-10-CM

## 2015-04-12 DIAGNOSIS — J9601 Acute respiratory failure with hypoxia: Secondary | ICD-10-CM

## 2015-04-12 DIAGNOSIS — D631 Anemia in chronic kidney disease: Secondary | ICD-10-CM

## 2015-04-12 LAB — RENAL FUNCTION PANEL
Albumin: 2 g/dL — ABNORMAL LOW (ref 3.5–5.0)
Anion gap: 6 (ref 5–15)
BUN: 17 mg/dL (ref 6–20)
CALCIUM: 8.4 mg/dL — AB (ref 8.9–10.3)
CO2: 29 mmol/L (ref 22–32)
CREATININE: 3.6 mg/dL — AB (ref 0.61–1.24)
Chloride: 97 mmol/L — ABNORMAL LOW (ref 101–111)
GFR calc Af Amer: 20 mL/min — ABNORMAL LOW (ref >60)
GFR calc non Af Amer: 17 mL/min — ABNORMAL LOW (ref >60)
Glucose, Bld: 117 mg/dL — ABNORMAL HIGH (ref 70–99)
Phosphorus: 3.6 mg/dL (ref 2.5–4.6)
Potassium: 3.7 mmol/L (ref 3.5–5.1)
Sodium: 132 mmol/L — ABNORMAL LOW (ref 135–145)

## 2015-04-12 LAB — CBC
HEMATOCRIT: 24.7 % — AB (ref 39.0–52.0)
HEMOGLOBIN: 7.8 g/dL — AB (ref 13.0–17.0)
MCH: 28.7 pg (ref 26.0–34.0)
MCHC: 31.6 g/dL (ref 30.0–36.0)
MCV: 90.8 fL (ref 78.0–100.0)
Platelets: 285 10*3/uL (ref 150–400)
RBC: 2.72 MIL/uL — AB (ref 4.22–5.81)
RDW: 14.8 % (ref 11.5–15.5)
WBC: 5.3 10*3/uL (ref 4.0–10.5)

## 2015-04-12 LAB — GLUCOSE, CAPILLARY
GLUCOSE-CAPILLARY: 73 mg/dL (ref 70–99)
Glucose-Capillary: 114 mg/dL — ABNORMAL HIGH (ref 70–99)

## 2015-04-12 MED ORDER — HYDRALAZINE HCL 25 MG PO TABS: 25.0000 mg | ORAL_TABLET | Freq: Two times a day (BID) | ORAL | Status: DC

## 2015-04-12 MED ORDER — METOPROLOL TARTRATE 25 MG PO TABS: 50.0000 mg | ORAL_TABLET | Freq: Two times a day (BID) | ORAL | Status: DC

## 2015-04-12 MED ORDER — ISOSORBIDE MONONITRATE ER 60 MG PO TB24: 60.0000 mg | ORAL_TABLET | Freq: Every day | ORAL | Status: AC

## 2015-04-12 MED ORDER — GABAPENTIN 300 MG PO CAPS: 300.0000 mg | ORAL_CAPSULE | Freq: Every day | ORAL | Status: DC

## 2015-04-12 MED ORDER — FUROSEMIDE 80 MG PO TABS: 80.0000 mg | ORAL_TABLET | Freq: Two times a day (BID) | ORAL | Status: DC

## 2015-04-12 MED ORDER — FUROSEMIDE 80 MG PO TABS
80.0000 mg | ORAL_TABLET | Freq: Two times a day (BID) | ORAL | Status: DC
  Administered 2015-04-12: 80 mg via ORAL
  Filled 2015-04-12 (×2): qty 1

## 2015-04-12 MED ORDER — DARBEPOETIN ALFA 100 MCG/0.5ML IJ SOSY: 100.0000 ug | PREFILLED_SYRINGE | INTRAMUSCULAR | Status: AC

## 2015-04-12 MED ORDER — CALCIUM ACETATE (PHOS BINDER) 667 MG PO CAPS: 1334.0000 mg | ORAL_CAPSULE | Freq: Three times a day (TID) | ORAL | Status: AC

## 2015-04-12 NOTE — Procedures (Signed)
I have personally attended this patient's dialysis session.   4K bath (K 3.7), 4 liter goal TDC 400 running well Will use post HD weight minus 1 kg as "starting" EDW for outpt Anticipate discharge home after hemodialysis Next HD Tuesday at [place], [place] Pager 04/12/2015, 8:16 AM

## 2015-04-12 NOTE — Discharge Instructions (Signed)
End-Stage Kidney Disease The kidneys are two organs that lie on either side of the spine between the middle of the back and the front of the abdomen. The kidneys:   Remove wastes and extra water from the blood.   Produce important hormones. These help keep bones strong, regulate blood pressure, and help create red blood cells.   Balance the fluids and chemicals in the blood and tissues. End-stage kidney disease occurs when the kidneys are so damaged that they cannot do their job. When the kidneys cannot do their job, life-threatening problems occur. The body cannot stay clean and strong without the help of the kidneys. In end-stage kidney disease, the kidneys cannot get better.You need a new kidney or treatments to do some of the work healthy kidneys do in order to stay alive. CAUSES  End-stage kidney disease usually occurs when a long-lasting (chronic) kidney disease gets worse. It may also occur after the kidneys are suddenly damaged (acute kidney injury).  SYMPTOMS   Swelling (edema) of the legs, ankles, or feet.   Tiredness (lethargy).   Nausea or vomiting.   Confusion.   Problems with urination, such as:   Decreased urine production.   Frequent urination, especially at night.   Frequent accidents in children who are potty trained.   Muscle twitches and cramps.   Persistent itchiness.   Loss of appetite.   Headaches.   Abnormally dark or light skin.   Numbness in the hands or feet.   Easy bruising.   Frequent hiccups.   Menstruation stops. DIAGNOSIS  Your health care provider will measure your blood pressure and take some tests. These may include:   Urine tests.   Blood tests.   Imaging tests, such as:   An ultrasound exam.   Computed tomography (CT).  A kidney biopsy. TREATMENT  There are two treatments for end-stage kidney disease:  1. A procedure that removes toxic wastes from the body (dialysis).  2. Receiving a new kidney  (kidney transplant). Both of these treatments have serious risks and consequences. Your health care provider will help you determine which treatment is best for you based on your health, age, and other factors. In addition to having dialysis or a kidney transplant, you may need to take medicines to control high blood pressure (hypertension) and cholesterol and to decrease phosphorus levels in your blood.  HOME CARE INSTRUCTIONS  Follow your prescribed diet.   Take medicines only as directed by your health care provider.   Do not take any new medicines (prescription, over-the-counter, or nutritional supplements) unless approved by your health care provider. Many medicines can worsen your kidney damage or need to have the dose adjusted.   Keep all follow-up visits as directed by your health care provider. MAKE SURE YOU:  Understand these instructions.  Will watch your condition.  Will get help right away if you are not doing well or get worse. Document Released: 02/12/2004 Document Revised: 04/08/2014 Document Reviewed: 07/21/2012 [facility] Patient Information 2015 [place], [place]. This information is not intended to replace advice given to you by your health care provider. Make sure you discuss any questions you have with your health care provider.  Hemodialysis Dialysis is a procedure that replaces some of the work that healthy kidneys do. It is done when you lose about 85-90% of your kidney function and sometimes earlier if your symptoms may be improved by dialysis. During dialysis, wastes, salt, and extra water are removed from the blood, and the level of certain chemicals  in the blood (such as potassium) is maintained. Hemodialysis is a type of dialysis in which a machine called a dialyzer is used to filter the blood. Hemodialysis is usually performed by a health care provider at a hospital or dialysis center 3 times a week for 3-5 hours during each visit. It may also be performed more  frequently and for longer periods of time at home with training. LET [facility] CARE PROVIDER KNOW ABOUT:  Any allergies you have.  All medicines you are taking, including vitamins, herbs, eye drops, creams, and over-the-counter medicines.  Any blood disorders you have.  Other health problems you have. RISKS AND COMPLICATIONS Generally, hemodialysis is a safe procedure. However, problems can occur and include:  Low blood pressure (hypotension).  Narrowing or ballooning of a blood vessel or bleeding at the site where blood is removed from the body and returned to the body (vascular access).  An infection or blockage at the vascular access site. BEFORE THE PROCEDURE Before having hemodialysis for the first time, you will need surgery to create a site where blood can be removed from the body and returned to the body (vascular access). There are three types of vascular accesses:  Arteriovenous fistula. This type of access is created when an artery and a vein (usually in the arm) are connected during surgery. The arteriovenous fistula takes 1-6 months to develop after surgery.  Arteriovenous graft. This type of access is created when an artery and a vein in the arm are connected during surgery with a tube. An arteriovenous graft can be used within 2-3 weeks of surgery.  A venous catheter. To create this type of access, a thin, flexible tube (catheter) is placed in a large vein in your neck, chest, or groin. A venous catheter can be used right away. PROCEDURE  Hemodialysis is performed while you are sitting or reclining. During the procedure, you may sleep, read, watch television, or perform any other tasks that can be done while you are in this position. If you experience side effects or any other discomfort during the procedure, let your health care provider know. He or she may be able to make adjustments to help you feel more comfortable. Your hemodialysis process may look something like  this: 3. Your weight, blood pressure, pulse, and temperature will be measured. 4. The skin around your vascular access will be sterilized. 5. Your vascular access will be connected to the dialyzer. If your access is a fistula or graft, two needles will be inserted through the vascular access. The needles will be connected to a plastic tube that is connected to the dialyzer. They will be taped to your skin so that they do not move out of place. If your access is a catheter, it will be connected to a plastic tube that is connected to the dialyzer.  The dialysis machine will be turned on. This will cause your blood to flow to the dialyzer. The dialyzer will filter and clean your blood before returning it to your body. While the dialysis machine is working, your blood pressure and pulse will be checked several times. 6. Once dialysis is complete, your vascular access will be disconnected from the dialyzer. If your access is a fistula or graft, the needles will be removed and a bandage (dressing) will be placed over the access. AFTER THE PROCEDURE  Your weight will be measured.  Your blood may be tested to see whether your treatments are removing enough wastes. This is usually done once  a month.  You may experience or continue to experience side effects, including:  Dizziness.  Muscle cramps.  Nausea.  Headaches.  Feeling tired. Energy levels usually return to normal the day after the procedure.  Itchiness. Your health care provider may be able to prescribe medicine to relieve it.  Achy or jittery legs. You may feel like kicking your legs. This sometimes causes sleeping problems.  Allergic reaction to the chemicals used to sterilize the dialyzer. Document Released: 03/19/2013 Document Revised: 04/08/2014 Document Reviewed: 03/19/2013 [facility] Patient Information 2015 [place], [place]. This information is not intended to replace advice given to you by your health care provider. Make sure you  discuss any questions you have with your health care provider.

## 2015-04-12 NOTE — Discharge Summary (Signed)
Physician Discharge Summary  James [name] YNW:[id number] DOB: 08/13/1956 DOA: 03/22/2015  PCP: No primary care provider on file.  Admit date: 03/22/2015 Discharge date: 04/12/2015  Time spent: Greater than 30 minutes  Recommendations for Outpatient Follow-up:  1. Dr. Fabienne Bruns, VVS in 4 weeks: MDs office will call and arrange for appointment. 2. Dr. Loma Messing, Oncology on 04/17/15 at 2 PM for follow-up regarding MGUS versus multiple myeloma. 3. [facility]: Keep dialysis appointments on TTS-next appointment 04/15/15 4. Outpatient PT 5. DME cane  Discharge Diagnoses:  Principal Problem:   CHF (congestive heart failure) Active Problems:   Peripheral edema   HTN (hypertension)   DM (diabetes mellitus)   Acute renal failure   Anemia   Hypokalemia   Congestive heart disease   Hypoxia   Acute on chronic diastolic CHF (congestive heart failure), NYHA class 4   Multiple myeloma   Discharge Condition: Improved & Stable  Diet recommendation: Heart healthy and diabetic diet  Filed Weights   04/12/15 0616 04/12/15 0715 04/12/15 1130  Weight: 91.627 kg (202 lb) 91.1 kg (200 lb 13.4 oz) 86.9 kg (191 lb 9.3 oz)    History of present illness:  59 year old male with history of DM 2, HTN, usually got his care at the clinic in [facility], presented to the [facility] ED on 03/22/15 which if complaints of bilateral lower extremity swelling and scrotal swelling worsening over 4 weeks PTA. In the ED, he was found to be in renal failure with creatinine of 4, hypoxic on room air and was admitted for further evaluation.  Hospital Course:   Acute hypoxic respiratory failure secondary to acute on chronic systolic and diastolic congestive heart failure -2-D echo with ejection fraction 45-50% with LV diastolic dysfunction - TSH within normal limits - Appreciate cardiology assistance, cardiology following prn, last note on 4/25 - Started HD on 4/30, Lasix per  nephrology -  Resolved  New ESRD, due to collapsing FSGS - Nephrology follow-up appreciated - ANCA, ASO, HIV, GBM Ab, HCV, HBV, ANA, dsDNA negative. C3 and &C4 WNL.  - Renal biopsy 4/27 showed collapsing FSGS and no evidence of monoclonal process. - As per nephrology, patient now ESRD. Started hemodialysis 4/30 via a tunneled HD catheter placed on 4/29.  - Left upper extremity AVF placed 5/3 by VVS. Dr. Darrick Penna signed off on 5/4-outpatient follow-up in 1 month - CLIPPED to [facility] TTS 2nd shift and can start there on Tuesday 5/10 - As per nephrology, edema continues to improve but remains. Weight down approximately 40 pounds since admission. EDW not established yet. Nephrology recommended continuing Lasix 80 mg twice a day to help with volume and expect urine output to dwindle off eventually. - Nephrology has cleared patient for discharge home today.  Plasma cell dyscrasia:? MGUS versus multiple myeloma. - Beta-2 microglobulin 8.3, cryoglobulin pending, upep IFE igG kappa, spep mspike 1.8%, SFL kappa 113 k/l ratio 3.7 - IgG 2678, IgA 102, IgM <6 -Bone survey negative - Bone marrow biopsy 4/27: 11% plasma cell /keppa restricted, cytogenetics pending - Oncology consultation appreciated and they are "on the fence" with regard to treatment of multiple myeloma.  - No evidence of monoclonal process on renal biopsy - Oncology input 5/4 appreciated: They will follow up cytogenetics and outpatient follow-up at [facility] with Dr. Loma Messing (appointment has been scheduled).  Diabetes mellitus type 2 with renal complications/hypoglycemia, - Hemoglobin A1c: 7.2 - Patient was initially placed on Levemir and SSI but started having hypoglycemic episodes  despite titrating down doses. Eventually all insulins were discontinued and patient CBGs are controlled without further hypoglycemic episodes.  Essential hypertension -Controlled on metoprolol, hydralazine and  Imdur  -ARB held due to AKI - Meds being titrated down by Renal.  Anemia - Folate/ b12/tsh wnl, epo level: 27.6, does has iron deficiency, Started iron supplementation - hgb dropped below 7 on 5/1, 1unit prbc given.  - nephrology have started Aranesp and will continue same across dialysis.  - Patient s/p Feraheme 5/2 - Hemoglobin stable  Secondary hyperparathyroidism - Started on calcitriol-discontinued by nephrology due to PTH only 118 - Continue PhosLo  Low-grade fever - Noticed 2 days ago - Patient denies any symptoms suggestive of source - No source identified on clinical exam - MAXIMUM TEMPERATURE 29F - No further intervention at this time.  Consultants: 1. Cardiology 2. Nephrology 3. IR for BM & Renal Biopsy 4. Medical Oncology 5. VVS  Procedures:  BM & Renal Bx on 4/27  HD form 4/30   Tunneled dialysis catheter by IR 4/29  LUE AVF 5/3   Discharge Exam:  Complaints:  Denies complaints.  Filed Vitals:   04/12/15 1126 04/12/15 1130 04/12/15 1251 04/12/15 1406  BP: 159/86 161/86 123/62 107/53  Pulse: 77 79 80 70  Temp:    98.4 F (36.9 C)  TempSrc:    Oral  Resp:    20  Height:      Weight:  86.9 kg (191 lb 9.3 oz)    SpO2:   100% 94%    General exam: Pleasant middle-aged male lying comfortably in bed undergoing dialysis this morning. Respiratory system: Clear. No increased work of breathing. Cardiovascular system: S1 & S2 heard, RRR. No JVD, murmurs, gallops, clicks or pedal edema. Not on telemetry. Gastrointestinal system: Abdomen is nondistended, soft and nontender. Normal bowel sounds heard. Central nervous system: Alert and oriented. No focal neurological deficits. Extremities: Symmetric 5 x 5 power.  Discharge Instructions      Discharge Instructions    (HEART FAILURE PATIENTS) Call MD:  Anytime you have any of the following symptoms: 1) 3 pound weight gain in 24 hours or 5 pounds in 1 week 2) shortness of breath, with or  without a dry hacking cough 3) swelling in the hands, feet or stomach 4) if you have to sleep on extra pillows at night in order to breathe.    Complete by:  As directed      Call MD for:  difficulty breathing, headache or visual disturbances    Complete by:  As directed      Call MD for:  extreme fatigue    Complete by:  As directed      Call MD for:  persistant dizziness or light-headedness    Complete by:  As directed      Call MD for:  persistant nausea and vomiting    Complete by:  As directed      Call MD for:  redness, tenderness, or signs of infection (pain, swelling, redness, odor or green/yellow discharge around incision site)    Complete by:  As directed      Call MD for:  severe uncontrolled pain    Complete by:  As directed      Call MD for:  temperature >100.4    Complete by:  As directed      Diet - low sodium heart healthy    Complete by:  As directed      Diet Carb Modified  Complete by:  As directed      Increase activity slowly    Complete by:  As directed             Medication List    STOP taking these medications        gabapentin 800 MG tablet  Commonly known as:  NEURONTIN  Replaced by:  gabapentin 300 MG capsule     insulin detemir 100 UNIT/ML injection  Commonly known as:  LEVEMIR     lisinopril 20 MG tablet  Commonly known as:  PRINIVIL,ZESTRIL      TAKE these medications        calcium acetate 667 MG capsule  Commonly known as:  PHOSLO  Take 2 capsules (1,334 mg total) by mouth 3 (three) times daily with meals.     Darbepoetin Alfa 100 MCG/0.5ML Sosy injection  Commonly known as:  ARANESP  Inject 0.5 mLs (100 mcg total) into the vein every Thursday with hemodialysis. Will be given at dialysis.     furosemide 80 MG tablet  Commonly known as:  LASIX  Take 1 tablet (80 mg total) by mouth 2 (two) times daily.     gabapentin 300 MG capsule  Commonly known as:  NEURONTIN  Take 1 capsule (300 mg total) by mouth at bedtime.      hydrALAZINE 25 MG tablet  Commonly known as:  APRESOLINE  Take 1 tablet (25 mg total) by mouth 2 (two) times daily.     isosorbide mononitrate 60 MG 24 hr tablet  Commonly known as:  IMDUR  Take 1 tablet (60 mg total) by mouth daily.     metoprolol tartrate 25 MG tablet  Commonly known as:  LOPRESSOR  Take 2 tablets (50 mg total) by mouth 2 (two) times daily.       Follow-up Information    Follow up with Fabienne Bruns, MD In 4 weeks.   Specialties:  Vascular Surgery, Cardiology   Why:  Our office will call you to arrange an appointment (sent)   Contact information:   [address] [place] [address] [phone]       Follow up with Arvil Chaco, MD On 04/17/2015.   Specialties:  Hematology and Oncology, Oncology   Why:  2:00pm   Contact information:   [address] [place] [address] [phone]       Follow up with [facility] On 04/15/2015.   Why:  Keep dialysis appointments on Tuesdays/Thursdays/Saturdays.       The results of significant diagnostics from this hospitalization (including imaging, microbiology, ancillary and laboratory) are listed below for reference.    Significant Diagnostic Studies: Dg Chest 2 View  03/22/2015   CLINICAL DATA:  Hypoxia.  Hypertension and diabetes.  EXAM: CHEST  2 VIEW  COMPARISON:  None.  FINDINGS: There is mild cardiomegaly and vascular pedicle widening. Diffuse interstitial opacity with Kerley lines. Small bilateral pleural effusions with atelectasis.  IMPRESSION: CHF.   Electronically Signed   By: Marnee Spring M.D.   On: 03/22/2015 22:51   US Renal  03/22/2015   CLINICAL DATA:  Renal failure.  Abdominal swelling.  Leg swelling.  EXAM: RENAL/URINARY TRACT ULTRASOUND COMPLETE  COMPARISON:  None.  FINDINGS: Right Kidney:  Length: 13.5 cm. Echogenicity within normal limits. No mass or hydronephrosis visualized.  Left Kidney:  Length: 12.1 cm. Echogenicity within normal limits. There is a 1.4 cm cyst in  the interpolar left kidney. No solid mass or hydronephrosis visualized.  Bladder:  Appears normal for degree of bladder distention.  Incidental:  Bilateral pleural effusions noted.  IMPRESSION: 1. Mild asymmetry in renal size, with right kidney mildly enlarged measuring 13.5 cm. No hydronephrosis or localizing abnormality. This may be related to mild renal hypertrophy. 2. Simple cyst in the interpolar left kidney, left kidney is otherwise normal. 3. Bilateral pleural effusions, incidentally noted.   Electronically Signed   By: Rubye Oaks M.D.   On: 03/22/2015 22:45   US Biopsy  04/02/2015   CLINICAL DATA:  Acute renal failure, suspect multiple myeloma  EXAM: ULTRASOUND GUIDED CORE BIOPSY OF LEFT RENAL CORTEX  MEDICATIONS: 1.0 mg IV Versed; 50 mcg IV Fentanyl  Total Moderate Sedation Time: 15  PROCEDURE: The procedure, risks, benefits, and alternatives were explained to the patient. Questions regarding the procedure were encouraged and answered. The patient understands and consents to the procedure.  The left flank was prepped with Betadine In a sterile fashion, and a sterile drape was applied covering the operative field. A sterile gown and sterile gloves were used for the procedure. Local anesthesia was provided with 1% Lidocaine.  Previous imaging reviewed. Patient positioned prone. Preliminary ultrasound performed. The left kidney lower pole cortex was localized. Under sterile conditions and local anesthesia, a 16 gauge core biopsy needle was advanced to the left kidney lower pole cortex. 2 16 gauge core biopsies obtained. Samples placed in saline. These were intact and non fragmented. Postprocedure imaging demonstrates a small amount of surrounding lower pole hemorrhage from the biopsy. No hydronephrosis. Patient tolerated the biopsy well.  COMPLICATIONS: None.  FINDINGS: Imaging confirms needle placement to the left kidney lower pole cortex for core biopsy  IMPRESSION: Successful ultrasound left renal  core biopsy   Electronically Signed   By: Judie Petit.  Shick M.D.   On: 04/02/2015 10:55   Ir Fluoro Guide Cv Line Right  04/07/2015   CLINICAL DATA:  Renal failure  EXAM: TUNNELED DIALYSIS CATHETER PLACEMENT, ULTRASOUND GUIDANCE FOR VASCULAR ACCESS  FLUOROSCOPY TIME:  30 seconds.  MEDICATIONS AND MEDICAL HISTORY: Versed 1 mg, Fentanyl 50 mcg.  As antibiotic prophylaxis, Ancef was ordered pre-procedure and administered intravenously within one hour of incision.  ANESTHESIA/SEDATION: Moderate sedation time: 15 minutes  CONTRAST:  None  PROCEDURE: The procedure, risks, benefits, and alternatives were explained to the patient. Questions regarding the procedure were encouraged and answered. The patient understands and consents to the procedure.  The right neck was prepped with Betadine in a sterile fashion, and a sterile drape was applied covering the operative field. A sterile gown and sterile gloves were used for the procedure. 1% lidocaine into the skin and subcutaneous tissue. The right internal jugular vein was noted to be patent initially with ultrasound. Under sonographic guidance, a micropuncture needle was inserted into the right IJ vein (Ultrasound and fluoroscopic image documentation was performed). It was removed over an 018 wire which was upsized to an Amplatz. This was advanced into the IVC.  A small incision was made in the right upper chest. The tunneling device was utilized to advance the 23 centimeter tip to cuff catheter from the chest incision and out the neck incision. A peel-away sheath was advanced over the Amplatz wire. The leading edge of the catheter was then advanced through the peel-away sheath. The peel-away sheath was removed. It was flushed and instilled with heparin. The chest incision was closed with a 0 Prolene pursestring stitch. The neck incision was closed with a 4-0 Vicryl subcuticular stitch.  COMPLICATIONS: None  FINDINGS: The  image demonstrates placement of a tunneled dialysis catheter  with its tip in the right atrium.  IMPRESSION: Successful right IJ vein tunneled dialysis catheter with its tip in the right atrium.   Electronically Signed   By: Jolaine Click M.D.   On: 04/07/2015 14:30   Ir US Guide Vasc Access Right  04/07/2015   CLINICAL DATA:  Renal failure  EXAM: TUNNELED DIALYSIS CATHETER PLACEMENT, ULTRASOUND GUIDANCE FOR VASCULAR ACCESS  FLUOROSCOPY TIME:  30 seconds.  MEDICATIONS AND MEDICAL HISTORY: Versed 1 mg, Fentanyl 50 mcg.  As antibiotic prophylaxis, Ancef was ordered pre-procedure and administered intravenously within one hour of incision.  ANESTHESIA/SEDATION: Moderate sedation time: 15 minutes  CONTRAST:  None  PROCEDURE: The procedure, risks, benefits, and alternatives were explained to the patient. Questions regarding the procedure were encouraged and answered. The patient understands and consents to the procedure.  The right neck was prepped with Betadine in a sterile fashion, and a sterile drape was applied covering the operative field. A sterile gown and sterile gloves were used for the procedure. 1% lidocaine into the skin and subcutaneous tissue. The right internal jugular vein was noted to be patent initially with ultrasound. Under sonographic guidance, a micropuncture needle was inserted into the right IJ vein (Ultrasound and fluoroscopic image documentation was performed). It was removed over an 018 wire which was upsized to an Amplatz. This was advanced into the IVC.  A small incision was made in the right upper chest. The tunneling device was utilized to advance the 23 centimeter tip to cuff catheter from the chest incision and out the neck incision. A peel-away sheath was advanced over the Amplatz wire. The leading edge of the catheter was then advanced through the peel-away sheath. The peel-away sheath was removed. It was flushed and instilled with heparin. The chest incision was closed with a 0 Prolene pursestring stitch. The neck incision was closed with a 4-0  Vicryl subcuticular stitch.  COMPLICATIONS: None  FINDINGS: The image demonstrates placement of a tunneled dialysis catheter with its tip in the right atrium.  IMPRESSION: Successful right IJ vein tunneled dialysis catheter with its tip in the right atrium.   Electronically Signed   By: Jolaine Click M.D.   On: 04/07/2015 14:30   Ct Biopsy  04/02/2015   CLINICAL DATA:  MULTIPLE MYELOMA  EXAM: CT GUIDED RIGHT ILIAC BONE MARROW ASPIRATION AND CORE BIOPSY  Date:  4/27/20164/27/2016 10:43 am  Radiologist:  M. Ruel Favors, MD  Guidance:  CT  FLUOROSCOPY TIME:  None.  MEDICATIONS AND MEDICAL HISTORY: 1 mg Versed, 50 mcg fentanyl  ANESTHESIA/SEDATION: 15 minutes  CONTRAST:  None.  COMPLICATIONS: None  PROCEDURE: Informed consent was obtained from the patient following explanation of the procedure, risks, benefits and alternatives. The patient understands, agrees and consents for the procedure. All questions were addressed. A time out was performed.  The patient was positioned prone and noncontrast localization CT was performed of the pelvis to demonstrate the iliac marrow spaces.  Maximal barrier sterile technique utilized including caps, mask, sterile gowns, sterile gloves, large sterile drape, hand hygiene, and betadine prep.  Under sterile conditions and local anesthesia, an 11 gauge coaxial bone biopsy needle was advanced into the right iliac marrow space. Needle position was confirmed with CT imaging. Initially, bone marrow aspiration was performed. Next, the 11 gauge outer cannula was utilized to obtain a right iliac bone marrow core biopsy. Needle was removed. Hemostasis was obtained with compression. The patient tolerated the procedure well. Samples  were prepared with the cytotechnologist. No immediate complications.  IMPRESSION: CT guided right iliac bone marrow aspiration and core biopsy.   Electronically Signed   By: Judie Petit.  Shick M.D.   On: 04/02/2015 10:53   Dg Bone Survey Met  03/28/2015   CLINICAL DATA:   Metastatic bone survey, multiple myeloma  EXAM: METASTATIC BONE SURVEY  COMPARISON:  03/22/2015  FINDINGS: Twenty-three images of skeletal structures submitted. Frontal view of the chest shows no destructive bony lesions. There is small left pleural effusion with left basilar atelectasis or infiltrate. Frontal and lateral view of the skull shows no lytic or blastic bone lesions. Frontal view bilateral shoulder and bilateral humerus shows no lytic or blastic lesions. Frontal and lateral view of the cervical spine shows the alignment preserved. No lytic or blastic lesions.  Frontal and lateral view of thoracic spine and lumbar spine shows alignment preserved. No lytic or blastic lesions. Frontal view bilateral hips and femur shows no lytic or blastic lesions. Frontal view bilateral tibia and fibula shows no lytic or blastic lesions. Bilateral forearm shows no lytic or blastic lesion.  IMPRESSION: No lytic or blastic lesions are identified. No evidence of metastatic disease. There is small left pleural effusion left basilar atelectasis or infiltrate.   Electronically Signed   By: Natasha Mead M.D.   On: 03/28/2015 14:27    Microbiology: No results found for this or any previous visit (from the past 240 hour(s)).   Labs: Basic Metabolic Panel:  Recent Labs Lab 04/08/15 0445 04/09/15 0355 04/10/15 0350 04/11/15 0717 04/12/15 0333  NA 134* 136 135 135 132*  K 4.0 3.9 3.8 3.9 3.7  CL 95* 99* 99* 100* 97*  CO2 30 29 30 29 29   GLUCOSE 160* 79 122* 84 117*  BUN 32* 19 23* 12 17  CREATININE 4.36* 3.31* 3.96* 2.97* 3.60*  CALCIUM 8.3* 8.0* 8.0* 8.1* 8.4*  PHOS 4.7* 3.6 3.9 2.9 3.6   Liver Function Tests:  Recent Labs Lab 04/08/15 0445 04/09/15 0355 04/10/15 0350 04/11/15 0717 04/12/15 0333  ALBUMIN 1.8* 1.8* 1.8* 1.9* 2.0*   No results for input(s): LIPASE, AMYLASE in the last 168 hours. No results for input(s): AMMONIA in the last 168 hours. CBC:  Recent Labs Lab 04/07/15 0406  04/08/15 0445 04/09/15 0355 04/11/15 1206 04/12/15 0333  WBC 7.0 5.9 5.6 5.8 5.3  HGB 7.5* 7.3* 7.3* 7.7* 7.8*  HCT 23.5* 22.5* 22.8* 24.4* 24.7*  MCV 88.7 90.7 90.5 90.4 90.8  PLT 269 261 256 292 285   Cardiac Enzymes: No results for input(s): CKTOTAL, CKMB, CKMBINDEX, TROPONINI in the last 168 hours. BNP: BNP (last 3 results)  Recent Labs  03/22/15 1903  BNP 1043.8*    ProBNP (last 3 results) No results for input(s): PROBNP in the last 8760 hours.  CBG:  Recent Labs Lab 04/11/15 1104 04/11/15 1641 04/11/15 2051 04/12/15 0615 04/12/15 1216  GLUCAP 115* 140* 134* 114* 73       Signed:  Zarina Pe, MD, FACP, FHM. [facility] Pager [phone]  If 7PM-7AM, please contact night-coverage [email or web address] Password [facility] 04/12/2015, 3:47 PM

## 2015-04-12 NOTE — Progress Notes (Signed)
Pt needs Outpatient PT . Referred to case mgr .and said PCP needs to arrange but  Pt has no PCP.Made DR [facility] aware. Case Manager,Julie on Monday has to arrange for finding PCP. Pt and brother,Donald was informed .Civil engineer, contracting aware . Will follow up. Discharge instructions and prescriptions given. Verbalized understanding. Wheeled to lobby by NT

## 2015-04-12 NOTE — Care Management Note (Signed)
Case Management Note  Patient Details  Name: James [name] MRN: [id number] Date of Birth: 1956-03-23  Subjective/Objective:                   chf Action/Plan:  Discharge planning Expected Discharge Date:  04/15/15               Expected Discharge Plan:  [place]  In-House Referral:  Financial Counselor  Discharge planning Services  CM Consult  Post Acute Care Choice:  NA Choice offered to:  NA  DME Arranged:  Kasandra Knudsen DME Agency:  [place] Arranged:  RN [facility] Agency:  [place]  Status of Service:  Completed, signed off  Medicare Important Message Given:  No Date Medicare IM Given:    Medicare IM give by:    Date Additional Medicare IM Given:    Additional Medicare Important Message give by:     If discussed at [place] of Stay Meetings, dates discussed:    Additional Comments: CM has requested [facility] DME rep, Germaine for [facility]  And [facility] rep, Tiffany for [facility].  No other CM needs were communicated.   Dellie Catholic, RN 04/12/2015, 4:17 PM

## 2015-04-12 NOTE — Progress Notes (Signed)
Renal Daily Progress Note  S: Seen in HD Says "ready to get out of here" No new complaints TMax 99's Says has transportation to HD as outpatient - has TTS 2nd shift chair  Still making a fair amount of urine so have continued his diuretics  O:Temp:  [98.6 F (37 C)-99.3 F (37.4 C)] 99.1 F (37.3 C) (05/07 0715) Pulse Rate:  [70-85] 74 (05/07 0715) Resp:  [16-18] 16 (05/07 0715) BP: (135-176)/(61-91) 176/86 mmHg (05/07 0715) SpO2:  [94 %-96 %] 94 % (05/07 0715) Weight:  [91.1 kg (200 lb 13.4 oz)-91.627 kg (202 lb)] 91.1 kg (200 lb 13.4 oz) (05/07 0715)  Intake/Output Summary (Last 24 hours) at 04/12/15 0756 Last data filed at 04/12/15 0600  Gross per 24 hour  Intake   1010 ml  Output   1650 ml  Net   -640 ml   Intake/Output: I/O last 3 completed shifts: In: 900 [P.O.:900] Out: 5100 [Urine:1600; Other:3500]  Gen: Pleasant 59 y.o.male in no distress. Seen on HD. CV: Regular rate, no murmur, gallop or rub Pulm: Clear without crackles or wheezes GI: +BS, soft, non-tender, non-distended Skin: No rashes or bruising Access: Tunneled catheter without erythema, induration; dressing c/d/i.  L radiocephalic AVF with bruit that diminishes proximally moving up the forearm without overlaying erythema or tenderness.  Radial pulses symmetric and 2+, hand warm with good grip Symmetrical 1-2+ pitting LE edema but improved with some skin wrinkling now Scrotal edema resolved    Recent Labs Lab 04/06/15 1457 04/07/15 0406 04/08/15 0445 04/09/15 0355 04/10/15 0350 04/11/15 0717 04/12/15 0333  NA 133* 133* 134* 136 135 135 132*  K 4.6 4.8 4.0 3.9 3.8 3.9 3.7  CL 95* 95* 95* 99* 99* 100* 97*  CO2 29 28 30 29 30 29 29   GLUCOSE 162* 157* 160* 79 122* 84 117*  BUN 49* 52* 32* 19 23* 12 17  CREATININE 6.21* 6.01* 4.36* 3.31* 3.96* 2.97* 3.60*  ALBUMIN 1.9* 1.9* 1.8* 1.8* 1.8* 1.9* 2.0*  CALCIUM 8.1* 8.2* 8.3* 8.0* 8.0* 8.1* 8.4*  PHOS 6.4* 6.4* 4.7* 3.6 3.9 2.9 3.6  CBC:  Recent  Labs Lab 04/07/15 0406 04/08/15 0445 04/09/15 0355 04/11/15 1206 04/12/15 0333  WBC 7.0 5.9 5.6 5.8 5.3  HGB 7.5* 7.3* 7.3* 7.7* 7.8*  HCT 23.5* 22.5* 22.8* 24.4* 24.7*  MCV 88.7 90.7 90.5 90.4 90.8  PLT 269 261 256 292 285    CBG:  Recent Labs Lab 04/11/15 0553 04/11/15 1104 04/11/15 1641 04/11/15 2051 04/12/15 0615  GLUCAP 88 115* 140* 134* 114*    Medications No results found. . sodium chloride   Intravenous Once  . calcium acetate  1,334 mg Oral TID WC  . darbepoetin (ARANESP) injection - DIALYSIS  100 mcg Intravenous Q Thu-HD  . furosemide  120 mg Oral BID  . gabapentin  300 mg Oral QHS  . heparin  5,000 Units Subcutaneous 3 times per day  . hydrALAZINE  25 mg Oral BID  . hydrocerin   Topical BID  . insulin aspart  0-9 Units Subcutaneous TID WC  . isosorbide mononitrate  60 mg Oral Daily  . metoprolol tartrate  50 mg Oral BID WC  . senna-docusate  2 tablet Oral BID   Background 47M with hx/o DM2 and HTN admitted with nephrotic proteinuria, AKI, positive M-Spike on SPEP and K:L sFLC 3.78. Found to have LVH and dCHF. Renal Bx with collapsing FSGS explaining proteinuric rapid progression of CKD to ESRD. Bone marrow biopsy with 11% plasma  cells consistent with IgG Kappa multiple myeloma. Dialysis initiated 04/07/15.  A/P 1. New ESRD due to collapsing FSGS. Marland Kitchen ANCA, ASO, HIV, GBM Ab, HCV, HBV, ANA, dsDNA negative. C3 and &C4 WNL.  1. Renal Bx 4/27 with collapsing FSGS which is completely explanatory of renal course 1. CLIPPED tp [facility] TTS 2nd shift and can start there on Tuesday  2. L RC AVF (5/3 - Dr. Oneida Alar - will have 1 month F/U that they will schedule) and TDC (4/29 IR) 2. EDW not yet established: Edema continues to improve but remains. Weight down ~ 40 lbs since admission. Pre HD weight today 91.1 kg with 4 liter goal. Will use post weight minus 1 kg as starting EDW and make further adjustments as an outpatient. 4K bath as outpt for  now. 3. Continue diuretics to help with volume, change to po, expect UOP will dwindle off eventually. Would change lasix to 80 BID.  2. Low grade fever. TMax 99's past 24 hours. No other s/s infection.   3. Plasma cell dyscrasia. M-spike on SPEP and sFLC K:L 3.78- s/p BM Bx 4/27 with 11% plasma cells .  No evidence of renal monoclonal process.  Oncology will follow up cytogenetics and continue following at [facility] clinic with Dr. Ancil Linsey (appt scheduled yesterday by Dr. Bonner Puna for next week).  4. HTN: Reduced antihypertensives more yesterday (dropped hydralazine to 25 BID. May be able to remove additional meds in the future once volume well controlled. 5. DM2: unclear history 6. Hyperphosphatemia: cont PhosLo 2qAC. PTH only 118. Calcitriol stopped. (Goal PTH for Stage 5 CKD 150-300) 7. Anemia: Aranesp started. 100 QTues. Got Feraheme for Fe deficiency component.   OK for discharge to home post HD from renal standpoint.  Jamal Maes, MD [facility] [phone] Pager 04/12/2015, 8:07 AM

## 2015-04-17 ENCOUNTER — Ambulatory Visit (HOSPITAL_COMMUNITY): Payer: Self-pay

## 2015-04-17 ENCOUNTER — Ambulatory Visit (HOSPITAL_COMMUNITY): Payer: Self-pay | Admitting: Hematology & Oncology

## 2015-04-23 ENCOUNTER — Encounter (HOSPITAL_COMMUNITY): Payer: Medicaid Other

## 2015-04-23 ENCOUNTER — Telehealth (HOSPITAL_COMMUNITY): Payer: Self-pay | Admitting: Hematology & Oncology

## 2015-04-23 ENCOUNTER — Encounter (HOSPITAL_COMMUNITY): Payer: Medicaid Other | Attending: Oncology | Admitting: Oncology

## 2015-04-23 ENCOUNTER — Encounter (HOSPITAL_COMMUNITY): Payer: Self-pay | Admitting: Oncology

## 2015-04-23 VITALS — BP 129/69 | HR 68 | Temp 98.5°F | Resp 18 | Ht 67.0 in | Wt 192.1 lb

## 2015-04-23 DIAGNOSIS — C9000 Multiple myeloma not having achieved remission: Secondary | ICD-10-CM

## 2015-04-23 DIAGNOSIS — I10 Essential (primary) hypertension: Secondary | ICD-10-CM | POA: Diagnosis not present

## 2015-04-23 DIAGNOSIS — N189 Chronic kidney disease, unspecified: Secondary | ICD-10-CM

## 2015-04-23 DIAGNOSIS — Z992 Dependence on renal dialysis: Secondary | ICD-10-CM

## 2015-04-23 DIAGNOSIS — E119 Type 2 diabetes mellitus without complications: Secondary | ICD-10-CM

## 2015-04-23 DIAGNOSIS — D649 Anemia, unspecified: Secondary | ICD-10-CM

## 2015-04-23 LAB — CHROMOSOME ANALYSIS, BONE MARROW

## 2015-04-23 NOTE — Patient Instructions (Signed)
.  [place] at [facility] Discharge Instructions  RECOMMENDATIONS MADE BY THE CONSULTANT AND ANY TEST RESULTS WILL BE SENT TO YOUR REFERRING PHYSICIAN.  Labs in 8 weeks and return in 8 weeks  Thank you for choosing [place] at [facility] to provide your oncology and hematology care.  To afford each patient quality time with our provider, please arrive at least 15 minutes before your scheduled appointment time.    You need to re-schedule your appointment should you arrive 10 or more minutes late.  We strive to give you quality time with our providers, and arriving late affects you and other patients whose appointments are after yours.  Also, if you no show three or more times for appointments you may be dismissed from the clinic at the providers discretion.     Again, thank you for choosing [facility].  Our hope is that these requests will decrease the amount of time that you wait before being seen by our physicians.       _____________________________________________________________  Should you have questions after your visit to [facility], please contact our office at [phone] between the hours of 8:30 a.m. and 4:30 p.m.  Voicemails left after 4:30 p.m. will not be returned until the following business day.  For prescription refill requests, have your pharmacy contact our office.

## 2015-04-23 NOTE — Assessment & Plan Note (Addendum)
Multiple myeloma versus smoldering myeloma with 11% plasma cells on bone marrow aspiration and biopsy in the setting of ESRD requiring hemodialysis (Tues, Thurs, Sat) and normal cytogenetics.  ESRD is secondary to diabetes and HTN with a negative renal biopsy for m-proteins.  Negative bone survey for lytic/blastic lesions.  Labs in 8 weeks: CBC diff, CMET, LDH, ESR, CRP, MM panel, B2M.  When he gets insured, he needs close follow-up with primary care provider for treatment of diabetes and HTN.  Additionally, we will try to get a PET scan once improved.   Anemia is noted and we are unable to determine if this is from his plasma cell neoplasm or ESRD.  We will defer management to nephrology.  Return in 8 weeks for follow-up.  The International Myeloma Working Group criteria for the diagnosis of MM emphasize the importance of end organ damage in making the diagnosis.  The diagnosis of MM requires the fulfillment of the following criterion: ?Clonal bone marrow plasma cells ?10 percent or biopsy-proven bony or soft tissue plasmacytoma - Clonality should be established by showing a kappa/lambda light-chain restriction on flow cytometry, immunohistochemistry, or immunofluorescence. Bone marrow plasma cell percentage should be estimated from a core biopsy specimen, when possible. If there is disparity between the aspirate and core biopsy, the highest value should be used. Approximately 4 percent of patients may have fewer than 10 percent bone marrow plasma cells since marrow involvement may be focal, rather than diffuse. Repeat bone marrow biopsy should be considered in such patients. PLUS one of the following: ?Presence of related organ or tissue impairment (often recalled by the acronym CRAB) - End organ damage is suggested by increased plasma calcium level, renal insufficiency, anemia, and bone lesions. In order to be included as diagnostic criteria, changes in these factors must be felt to be related to  the underlying plasma cell proliferative disorder. For these purposes, the following definitions are used:  .Anemia - Hemoglobin <10 g/dL (<100 g/L) or >2 g/dL (>20 g/L) below normal  .Hypercalcemia - Serum calcium >11 mg/dL (>2.75 mmol/liter). Consider other causes of hypercalcemia (eg, hyperparathyroidism).   .Renal insufficiency - Estimated or measured creatine clearance <40 mL/min or serum creatinine >2 mg/dL (177 mol/liter). Of these, creatinine clearance is the preferred measure of renal insufficiency because normal serum creatinine levels vary by age, sex, and race. Using creatinine clearance ensures that a similar level of renal dysfunction is required to end organ damage.  .Bone lesions - One or more osteolytic lesions ?5 mm in size on skeletal radiography, MRI, CT, or PET/CT. In the absence of osteolytic lesions, the following are not sufficient markers of bone lesions: increased FDG uptake on PET, osteoporosis, or vertebral compression fracture. When a diagnosis is in doubt, biopsy of the bone lesion should be considered.    Monoclonal gammopathy of undetermined significance (MGUS) is diagnosed in persons who meet the following three criteria:  ?Serum monoclonal protein (whether IgA, IgG, or IgM) <3 g/dL  ?Clonal bone marrow plasma cells <10 percent  ?Absence of lytic lesions, anemia, hypercalcemia, and renal insufficiency (end-organ damage) that can be attributed to the plasma cell proliferative disorder MGUS carries a risk of progression to MM of approximately 1 percent per year. Differentiation of MGUS from MM can be difficult and is primarily based on presence or absence of related end-organ damage. In comparison to overt MM or plasma cell leukemia, most patients with MGUS or SMM have few or no circulating monoclonal plasma cells. The use of other clinical  and laboratory factors to differentiate between these two entities is discussed in more detail separately.    Smoldering multiple  myeloma - Smoldering multiple myeloma (SMM) is defined as:  ?M-protein ?3 g/dL and/or 10 to 60 percent bone marrow plasma cells, plus  ?No end-organ damage or other myeloma-defining events, and no amyloidosis  Thus, for the diagnosis of SMM, patients should not have any of the following myeloma-defining events:  ?End-organ damage (lytic lesions, anemia, renal disease, or hypercalcemia) that can be attributed to the underlying plasma cell disorder  ??60 percent clonal plasma cells in the bone marrow   ?Involved/uninvolved free light chain (FLC) ratio of 100 or more   ?Magnetic resonance imaging (MRI) with more than one focal lesion (involving bone or bone marrow) Asymptomatic patients with one or more of the myeloma-defining events listed above are considered to have MM rather than SMM because they have a risk of progression with complications of greater than 80 percent within two years.

## 2015-04-23 NOTE — Progress Notes (Addendum)
No primary care provider on file. No primary provider on file.  Multiple myeloma - Plan: CBC with Differential, Comprehensive metabolic panel, Lactate dehydrogenase, Sedimentation rate, C-reactive protein, Beta 2 microglobuline, serum, Multiple myeloma panel, serum, Kappa/lambda light chains  CURRENT THERAPY:Observation  INTERVAL HISTORY: James [name] 59 y.o. male is seen as a new patient at the [facility] for consideration of multiple myeloma versus MGUS after being hospitalized from 03/22/2015- 04/12/2015 for congestive heart failure with findings suggestive of a primary bone marrow disorder/malignancy.  In [facility], all information available to me starts on 03/22/2015.  I personally reviewed and went over laboratory results with the patient.  The results are noted within this dictation.  He is noted to be in renal failure and thus his need for hemodialysis.  I personally reviewed and went over radiographic studies with the patient.  The results are noted within this dictation.  Bone survey is negative for any sclerotic and lytic lesions.   I personally reviewed and went over pathology results with the patient.  Renal biopsy does not demonstrate any monoclonal protein.  Bone marrow aspiration and biopsy noted and reviewed.  He does have 11% plasma cell on testing.  He reports that he presented to [facility] ED after refusing to go to [facility] with "leg swelling up."  He was previously seen by the free clinic who provided him fluid pills for this problem while his labs were pending.  When the free clinic learned of his lab results, they tried to "get in touch with me but I was already in the hospital.  They sent a sheriff out to my house."  He does not have close follow-up by primary care.  He notes that he was initially treated with Metformin for his DM, but about 2 years ago, he became insulin dependent.  He has a very distant smoking history, smoking about 1/4 ppd x 1 year many years  ago.  He rarely has an EtOH beverage.  He denies any illicit drug abuse.    He lives in [place], [place].  He has never been married.  He has no children.  His dad is estranged from the family after he divorced the patient mother many years ago, but he is 70 something and alive.  Mom is about the same age and her health is good.  "She makes me breakfast every morning."  He lives with his two brothers and a cousin.    He notes that he has been on dialysis for about 1-2 weeks.  He has a port in place, but does have a left lower arm shunt on radial aspect.  "I feel good."  He denies any active complaints.   Past Medical History  Diagnosis Date  . Hypertension   . Diabetes mellitus without complication   . Chronic kidney disease     has Peripheral edema; CHF (congestive heart failure); HTN (hypertension); DM (diabetes mellitus); Acute renal failure; Anemia; Hypokalemia; Congestive heart disease; Hypoxia; Acute on chronic diastolic CHF (congestive heart failure), NYHA class 4; and Multiple myeloma on his problem list.     has No Known Allergies.  Current Outpatient Prescriptions on File Prior to Visit  Medication Sig Dispense Refill  . calcium acetate (PHOSLO) 667 MG capsule Take 2 capsules (1,334 mg total) by mouth 3 (three) times daily with meals. 90 capsule 0  . Darbepoetin Alfa (ARANESP) 100 MCG/0.5ML SOSY injection Inject 0.5 mLs (100 mcg total) into the vein every Thursday with hemodialysis.  Will be given at dialysis.    . furosemide (LASIX) 80 MG tablet Take 1 tablet (80 mg total) by mouth 2 (two) times daily. 30 tablet 0  . gabapentin (NEURONTIN) 300 MG capsule Take 1 capsule (300 mg total) by mouth at bedtime. 30 capsule 0  . hydrALAZINE (APRESOLINE) 25 MG tablet Take 1 tablet (25 mg total) by mouth 2 (two) times daily. 60 tablet 0  . isosorbide mononitrate (IMDUR) 60 MG 24 hr tablet Take 1 tablet (60 mg total) by mouth daily. 30 tablet 0  . metoprolol tartrate (LOPRESSOR) 25 MG tablet Take  2 tablets (50 mg total) by mouth 2 (two) times daily. 60 tablet 0   No current facility-administered medications on file prior to visit.    Past Surgical History  Procedure Laterality Date  . Colonoscopy    . Av fistula placement Left 04/08/2015    Procedure: LEFT RADIOCEPHALIC ARTERIOVENOUS (AV) FISTULA CREATION;  Surgeon: Elam Dutch, MD;  Location: [facility] OR;  Service: Vascular;  Laterality: Left;    Denies any headaches, dizziness, double vision, fevers, chills, night sweats, nausea, vomiting, diarrhea, constipation, chest pain, heart palpitations, shortness of breath, blood in stool, black tarry stool, urinary pain, urinary burning, urinary frequency, hematuria.   PHYSICAL EXAMINATION  ECOG PERFORMANCE STATUS: 0 - Asymptomatic  Filed Vitals:   04/23/15 1509  BP: 129/69  Pulse: 68  Temp: 98.5 F (36.9 C)  Resp: 18    GENERAL:alert, no distress, well nourished, well developed, comfortable, cooperative and smiling SKIN: skin color, texture, turgor are normal, no rashes or significant lesions HEAD: Normocephalic, No masses, lesions, tenderness or abnormalities EYES: normal, PERRLA, EOMI, Conjunctiva are pink and non-injected EARS: External ears normal OROPHARYNX:lips, buccal mucosa, and tongue normal, edentulous and mucous membranes are moist  NECK: supple, no adenopathy, thyroid normal size, non-tender, without nodularity, no stridor, non-tender, trachea midline LYMPH:  no palpable lymphadenopathy, no hepatosplenomegaly BREAST:not examined LUNGS: positive findings: wheezing  diffusely HEART: regular rate & rhythm, no murmurs, no gallops, S1 normal and S2 normal ABDOMEN:abdomen soft, non-tender, normal bowel sounds, no masses or organomegaly and no hepatosplenomegaly BACK: Back symmetric, no curvature., No CVA tenderness EXTREMITIES:less then 2 second capillary refill, no joint deformities, effusion, or inflammation, no edema, no skin discoloration, no clubbing, no cyanosis    NEURO: alert & oriented x 3 with fluent speech, no focal motor/sensory deficits, gait normal   LABORATORY DATA: CBC    Component Value Date/Time   WBC 5.3 04/12/2015 0333   RBC 2.72* 04/12/2015 0333   RBC 3.18* 03/23/2015 0330   HGB 7.8* 04/12/2015 0333   HCT 24.7* 04/12/2015 0333   PLT 285 04/12/2015 0333   MCV 90.8 04/12/2015 0333   MCH 28.7 04/12/2015 0333   MCHC 31.6 04/12/2015 0333   RDW 14.8 04/12/2015 0333   LYMPHSABS 1.6 04/02/2015 0520   MONOABS 0.7 04/02/2015 0520   EOSABS 0.3 04/02/2015 0520   BASOSABS 0.1 04/02/2015 0520      Chemistry      Component Value Date/Time   NA 132* 04/12/2015 0333   K 3.7 04/12/2015 0333   CL 97* 04/12/2015 0333   CO2 29 04/12/2015 0333   BUN 17 04/12/2015 0333   CREATININE 3.60* 04/12/2015 0333      Component Value Date/Time   CALCIUM 8.4* 04/12/2015 0333   ALKPHOS 159* 03/23/2015 0330   AST 29 03/23/2015 0330   ALT 23 03/23/2015 0330   BILITOT 0.6 03/23/2015 0330       RADIOGRAPHIC  STUDIES:  US Biopsy  04/02/2015   CLINICAL DATA:  Acute renal failure, suspect multiple myeloma  EXAM: ULTRASOUND GUIDED CORE BIOPSY OF LEFT RENAL CORTEX  MEDICATIONS: 1.0 mg IV Versed; 50 mcg IV Fentanyl  Total Moderate Sedation Time: 15  PROCEDURE: The procedure, risks, benefits, and alternatives were explained to the patient. Questions regarding the procedure were encouraged and answered. The patient understands and consents to the procedure.  The left flank was prepped with Betadine In a sterile fashion, and a sterile drape was applied covering the operative field. A sterile gown and sterile gloves were used for the procedure. Local anesthesia was provided with 1% Lidocaine.  Previous imaging reviewed. Patient positioned prone. Preliminary ultrasound performed. The left kidney lower pole cortex was localized. Under sterile conditions and local anesthesia, a 16 gauge core biopsy needle was advanced to the left kidney lower pole cortex. 2 16 gauge  core biopsies obtained. Samples placed in saline. These were intact and non fragmented. Postprocedure imaging demonstrates a small amount of surrounding lower pole hemorrhage from the biopsy. No hydronephrosis. Patient tolerated the biopsy well.  COMPLICATIONS: None.  FINDINGS: Imaging confirms needle placement to the left kidney lower pole cortex for core biopsy  IMPRESSION: Successful ultrasound left renal core biopsy   Electronically Signed   By: Jerilynn Mages.  Shick M.D.   On: 04/02/2015 10:55   Ir Fluoro Guide Cv Line Right  04/07/2015   CLINICAL DATA:  Renal failure  EXAM: TUNNELED DIALYSIS CATHETER PLACEMENT, ULTRASOUND GUIDANCE FOR VASCULAR ACCESS  FLUOROSCOPY TIME:  30 seconds.  MEDICATIONS AND MEDICAL HISTORY: Versed 1 mg, Fentanyl 50 mcg.  As antibiotic prophylaxis, Ancef was ordered pre-procedure and administered intravenously within one hour of incision.  ANESTHESIA/SEDATION: Moderate sedation time: 15 minutes  CONTRAST:  None  PROCEDURE: The procedure, risks, benefits, and alternatives were explained to the patient. Questions regarding the procedure were encouraged and answered. The patient understands and consents to the procedure.  The right neck was prepped with Betadine in a sterile fashion, and a sterile drape was applied covering the operative field. A sterile gown and sterile gloves were used for the procedure. 1% lidocaine into the skin and subcutaneous tissue. The right internal jugular vein was noted to be patent initially with ultrasound. Under sonographic guidance, a micropuncture needle was inserted into the right IJ vein (Ultrasound and fluoroscopic image documentation was performed). It was removed over an 018 wire which was upsized to an Amplatz. This was advanced into the IVC.  A small incision was made in the right upper chest. The tunneling device was utilized to advance the 23 centimeter tip to cuff catheter from the chest incision and out the neck incision. A peel-away sheath was advanced  over the Amplatz wire. The leading edge of the catheter was then advanced through the peel-away sheath. The peel-away sheath was removed. It was flushed and instilled with heparin. The chest incision was closed with a 0 Prolene pursestring stitch. The neck incision was closed with a 4-0 Vicryl subcuticular stitch.  COMPLICATIONS: None  FINDINGS: The image demonstrates placement of a tunneled dialysis catheter with its tip in the right atrium.  IMPRESSION: Successful right IJ vein tunneled dialysis catheter with its tip in the right atrium.   Electronically Signed   By: Marybelle Killings M.D.   On: 04/07/2015 14:30   Ir US Guide Vasc Access Right  04/07/2015   CLINICAL DATA:  Renal failure  EXAM: TUNNELED DIALYSIS CATHETER PLACEMENT, ULTRASOUND GUIDANCE FOR VASCULAR ACCESS  FLUOROSCOPY  TIME:  30 seconds.  MEDICATIONS AND MEDICAL HISTORY: Versed 1 mg, Fentanyl 50 mcg.  As antibiotic prophylaxis, Ancef was ordered pre-procedure and administered intravenously within one hour of incision.  ANESTHESIA/SEDATION: Moderate sedation time: 15 minutes  CONTRAST:  None  PROCEDURE: The procedure, risks, benefits, and alternatives were explained to the patient. Questions regarding the procedure were encouraged and answered. The patient understands and consents to the procedure.  The right neck was prepped with Betadine in a sterile fashion, and a sterile drape was applied covering the operative field. A sterile gown and sterile gloves were used for the procedure. 1% lidocaine into the skin and subcutaneous tissue. The right internal jugular vein was noted to be patent initially with ultrasound. Under sonographic guidance, a micropuncture needle was inserted into the right IJ vein (Ultrasound and fluoroscopic image documentation was performed). It was removed over an 018 wire which was upsized to an Amplatz. This was advanced into the IVC.  A small incision was made in the right upper chest. The tunneling device was utilized to  advance the 23 centimeter tip to cuff catheter from the chest incision and out the neck incision. A peel-away sheath was advanced over the Amplatz wire. The leading edge of the catheter was then advanced through the peel-away sheath. The peel-away sheath was removed. It was flushed and instilled with heparin. The chest incision was closed with a 0 Prolene pursestring stitch. The neck incision was closed with a 4-0 Vicryl subcuticular stitch.  COMPLICATIONS: None  FINDINGS: The image demonstrates placement of a tunneled dialysis catheter with its tip in the right atrium.  IMPRESSION: Successful right IJ vein tunneled dialysis catheter with its tip in the right atrium.   Electronically Signed   By: Marybelle Killings M.D.   On: 04/07/2015 14:30   Ct Biopsy  04/02/2015   CLINICAL DATA:  MULTIPLE MYELOMA  EXAM: CT GUIDED RIGHT ILIAC BONE MARROW ASPIRATION AND CORE BIOPSY  Date:  4/27/20164/27/2016 10:43 am  Radiologist:  M. Daryll Brod, MD  Guidance:  CT  FLUOROSCOPY TIME:  None.  MEDICATIONS AND MEDICAL HISTORY: 1 mg Versed, 50 mcg fentanyl  ANESTHESIA/SEDATION: 15 minutes  CONTRAST:  None.  COMPLICATIONS: None  PROCEDURE: Informed consent was obtained from the patient following explanation of the procedure, risks, benefits and alternatives. The patient understands, agrees and consents for the procedure. All questions were addressed. A time out was performed.  The patient was positioned prone and noncontrast localization CT was performed of the pelvis to demonstrate the iliac marrow spaces.  Maximal barrier sterile technique utilized including caps, mask, sterile gowns, sterile gloves, large sterile drape, hand hygiene, and betadine prep.  Under sterile conditions and local anesthesia, an 11 gauge coaxial bone biopsy needle was advanced into the right iliac marrow space. Needle position was confirmed with CT imaging. Initially, bone marrow aspiration was performed. Next, the 11 gauge outer cannula was utilized to obtain  a right iliac bone marrow core biopsy. Needle was removed. Hemostasis was obtained with compression. The patient tolerated the procedure well. Samples were prepared with the cytotechnologist. No immediate complications.  IMPRESSION: CT guided right iliac bone marrow aspiration and core biopsy.   Electronically Signed   By: Jerilynn Mages.  Shick M.D.   On: 04/02/2015 10:53   Dg Bone Survey Met  03/28/2015   CLINICAL DATA:  Metastatic bone survey, multiple myeloma  EXAM: METASTATIC BONE SURVEY  COMPARISON:  03/22/2015  FINDINGS: Twenty-three images of skeletal structures submitted. Frontal view of the chest shows  no destructive bony lesions. There is small left pleural effusion with left basilar atelectasis or infiltrate. Frontal and lateral view of the skull shows no lytic or blastic bone lesions. Frontal view bilateral shoulder and bilateral humerus shows no lytic or blastic lesions. Frontal and lateral view of the cervical spine shows the alignment preserved. No lytic or blastic lesions.  Frontal and lateral view of thoracic spine and lumbar spine shows alignment preserved. No lytic or blastic lesions. Frontal view bilateral hips and femur shows no lytic or blastic lesions. Frontal view bilateral tibia and fibula shows no lytic or blastic lesions. Bilateral forearm shows no lytic or blastic lesion.  IMPRESSION: No lytic or blastic lesions are identified. No evidence of metastatic disease. There is small left pleural effusion left basilar atelectasis or infiltrate.   Electronically Signed   By: Lahoma Crocker M.D.   On: 03/28/2015 14:27     PATHOLOGY:  Diagnosis Bone Marrow, Aspirate,Biopsy, and Clot, right iliac - NORMOCELLULAR BONE MARROW FOR AGE WITH PLASMA CELL NEOPLASM. - TRILINEAGE HEMATOPOIESIS. - SEE COMMENT. PERIPHERAL BLOOD: - NORMOCYTIC-NORMOCHROMIC ANEMIA Diagnosis Note The bone marrow is generally normocellular for age with trilineage hematopoiesis and nonspecific myeloid changes. However, the plasma  cells are increased in number representing 11% of all cells associated with atypical cytomorphologic features. Immunohistochemical stains show that the plasma cells are kappa light chain restricted consistent with plasma cell neoplasm. Correlation with cytogenetic studies is recommended. (BNS:kh/ds 04-03-15) Susanne Greenhouse MD Pathologist, Electronic Signature (Case signed 04/04/2015)    Diagnosis Kidney, biopsy w/ EM l, left cortex COLLAPSING VARIANT OF FOCAL AND SEGMENTAL GLOMERULOSCLEROSIS. DIABETIC GLOMERULOSCLEROSIS. MODERATE ARTERIONEPHROSLCEROSIS. NO EVIDENCE OF A MONOCLONAL IMMUNOGLOBULIN DEPOSITION DISEASE Diagnosis Note Diagnostic evaluation conducted by Providence Lanius. 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  ESRD is secondary to diabetes and HTN with a negative renal biopsy for m-proteins. Note kidney biopsy shows collapsing variant of focal and segmental glomerulosclerosis, diabetic glomerulosclerosis. Negative bone survey for lytic/blastic lesions.  Anemia is noted and at this point I suspect more likely from his ESRD.  We will defer management to nephrology for now.   Labs in 8 weeks: CBC diff, CMET, LDH, ESR, CRP, MM panel, B2M.  When he gets insured, he needs close follow-up with primary care provider for treatment of diabetes and HTN.  Additionally, we will try to get a PET scan once improved.   Return in 8 weeks for follow-up.  The International Myeloma Working Group criteria for the diagnosis of MM emphasize the importance of end organ damage in making the diagnosis.  The diagnosis of MM requires the fulfillment of the following criterion: ?Clonal bone marrow  plasma cells ?10 percent or biopsy-proven bony or soft tissue plasmacytoma - Clonality should be established by showing a kappa/lambda light-chain restriction on flow cytometry, immunohistochemistry, or immunofluorescence. Bone marrow plasma cell percentage should be estimated from a core biopsy specimen, when possible. If there is disparity between the aspirate and core biopsy, the highest value should be used. Approximately 4 percent of patients may have fewer than 10 percent bone marrow plasma cells since marrow involvement may be focal, rather than diffuse. Repeat bone marrow biopsy should be considered in such patients. PLUS one of the following: ?Presence of related  organ or tissue impairment (often recalled by the acronym CRAB) - End organ damage is suggested by increased plasma calcium level, renal insufficiency, anemia, and bone lesions. In order to be included as diagnostic criteria, changes in these factors must be felt to be related to the underlying plasma cell proliferative disorder. For these purposes, the following definitions are used:  .Anemia - Hemoglobin <10 g/dL (<100 g/L) or >2 g/dL (>20 g/L) below normal  .Hypercalcemia - Serum calcium >11 mg/dL (>2.75 mmol/liter). Consider other causes of hypercalcemia (eg, hyperparathyroidism).   .Renal insufficiency - Estimated or measured creatine clearance <40 mL/min or serum creatinine >2 mg/dL (177 mol/liter). Of these, creatinine clearance is the preferred measure of renal insufficiency because normal serum creatinine levels vary by age, sex, and race. Using creatinine clearance ensures that a similar level of renal dysfunction is required to end organ damage.  .Bone lesions - One or more osteolytic lesions ?5 mm in size on skeletal radiography, MRI, CT, or PET/CT. In the absence of osteolytic lesions, the following are not sufficient markers of bone lesions: increased FDG uptake on PET, osteoporosis, or vertebral compression fracture. When a  diagnosis is in doubt, biopsy of the bone lesion should be considered.    Smoldering multiple myeloma - Smoldering multiple myeloma (SMM) is defined as:  ?M-protein ?3 g/dL and/or 10 to 60 percent bone marrow plasma cells, plus  ?No end-organ damage or other myeloma-defining events, and no amyloidosis  Thus, for the diagnosis of SMM, patients should not have any of the following myeloma-defining events:  ?End-organ damage (lytic lesions, anemia, renal disease, or hypercalcemia) that can be attributed to the underlying plasma cell disorder  ??60 percent clonal plasma cells in the bone marrow   ?Involved/uninvolved free light chain (FLC) ratio of 100 or more   ?Magnetic resonance imaging (MRI) with more than one focal lesion (involving bone or bone marrow) Asymptomatic patients with one or more of the myeloma-defining events listed above are considered to have MM rather than SMM because they have a risk of progression with complications of greater than 80 percent within two years.   THERAPY PLAN:  (Dr. Donald Pore addendum)  Patient seen and examined.  Details are as above. 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 I do not believe that his kidney disease and anemia are secondary to active myeloma. I suspect his HTN and diabetes have caused his GLOMERULOSCLEROSIS.  I have recommended close observation moving forward. This was discussed with the patient in detail. I will also discuss his case with Dr. Melba Coon at [facility].   All questions were answered. The patient knows to call the clinic with any problems, questions or concerns. We can certainly see the patient much sooner if necessary.   This note is electronically signed by: Doy Mince 04/23/2015 5:39 PM  Molli Hazard, MD

## 2015-04-23 NOTE — Telephone Encounter (Signed)
Progress Notes by Reinaldo Raddle, RN at 04/09/2015 4:22 PM    Author: Reinaldo Raddle, RN Service: CASE MANAGEMENT Author Type: Case Manager   Filed: 04/09/2015 4:24 PM Note Time: 04/09/2015 4:22 PM Status: Signed   Editor: Reinaldo Raddle, RN (Case Manager)     Expand All Collapse All   Pt with CHF/AKI; new HD started on 04/05/15. Pt assisted with completion of Medicaid application this AM by financial counselor. CLIP process in progress. Bethena Roys in Hemodialysis to follow up when dialyis center established.   Will follow progress.   Lionel December, RN, BSN Phone [phone]    Per case mgr notes above pt has completed medicaid and in it is pending. I introduced myself to pt and offered my [facility] [phone]

## 2015-04-29 ENCOUNTER — Encounter (HOSPITAL_COMMUNITY): Payer: Self-pay

## 2015-05-06 ENCOUNTER — Encounter: Payer: Self-pay | Admitting: Vascular Surgery

## 2015-05-08 ENCOUNTER — Ambulatory Visit (INDEPENDENT_AMBULATORY_CARE_PROVIDER_SITE_OTHER): Payer: Self-pay | Admitting: Vascular Surgery

## 2015-05-08 ENCOUNTER — Encounter: Payer: Self-pay | Admitting: Vascular Surgery

## 2015-05-08 VITALS — BP 176/89 | HR 79 | Ht 67.0 in | Wt 196.0 lb

## 2015-05-08 DIAGNOSIS — N186 End stage renal disease: Secondary | ICD-10-CM

## 2015-05-08 NOTE — Progress Notes (Signed)
Patient is a 59 year old male who is status post placement of a left radiocephalic AV fistula on [id number]. He returns for follow-up today. He is currently dialyzing via a catheter Tuesday Thursday Saturday. He denies any numbness or tingling in the hand. The incision is healing without drainage.  Physical exam:  Filed Vitals:   05/08/15 0830  BP: 176/89  Pulse: 79  Height: 5\' 7"  (1.702 m)  Weight: 88.905 kg (196 lb)  SpO2: 99%    Left upper extremity: Healing left radiocephalic incision no drainage no erythema no hematoma palpable thrill in fistula fistula is easily visible on the skin surface over the first 5 cm.  Assessment: Maturing left radiocephalic AV fistula.  Plan: The patient will continue to exercise the fistula. He will follow-up in 6 weeks with a duplex ultrasound of the fistula to assess maturity.  Ruta Hinds, MD [facility] Office: [phone] Pager: [phone]

## 2015-05-14 DIAGNOSIS — Z0279 Encounter for issue of other medical certificate: Secondary | ICD-10-CM | POA: Diagnosis not present

## 2015-06-07 DIAGNOSIS — E118 Type 2 diabetes mellitus with unspecified complications: Secondary | ICD-10-CM | POA: Diagnosis not present

## 2015-06-07 DIAGNOSIS — Z23 Encounter for immunization: Secondary | ICD-10-CM | POA: Diagnosis not present

## 2015-06-07 DIAGNOSIS — D631 Anemia in chronic kidney disease: Secondary | ICD-10-CM | POA: Diagnosis not present

## 2015-06-07 DIAGNOSIS — N186 End stage renal disease: Secondary | ICD-10-CM | POA: Diagnosis not present

## 2015-06-10 DIAGNOSIS — E118 Type 2 diabetes mellitus with unspecified complications: Secondary | ICD-10-CM | POA: Diagnosis not present

## 2015-06-10 DIAGNOSIS — Z23 Encounter for immunization: Secondary | ICD-10-CM | POA: Diagnosis not present

## 2015-06-10 DIAGNOSIS — N186 End stage renal disease: Secondary | ICD-10-CM | POA: Diagnosis not present

## 2015-06-10 DIAGNOSIS — D631 Anemia in chronic kidney disease: Secondary | ICD-10-CM | POA: Diagnosis not present

## 2015-06-12 ENCOUNTER — Other Ambulatory Visit: Payer: Self-pay

## 2015-06-12 DIAGNOSIS — N186 End stage renal disease: Secondary | ICD-10-CM | POA: Diagnosis not present

## 2015-06-12 DIAGNOSIS — Z23 Encounter for immunization: Secondary | ICD-10-CM | POA: Diagnosis not present

## 2015-06-12 DIAGNOSIS — E118 Type 2 diabetes mellitus with unspecified complications: Secondary | ICD-10-CM | POA: Diagnosis not present

## 2015-06-12 DIAGNOSIS — D631 Anemia in chronic kidney disease: Secondary | ICD-10-CM | POA: Diagnosis not present

## 2015-06-12 DIAGNOSIS — Z4931 Encounter for adequacy testing for hemodialysis: Secondary | ICD-10-CM

## 2015-06-14 DIAGNOSIS — Z23 Encounter for immunization: Secondary | ICD-10-CM | POA: Diagnosis not present

## 2015-06-14 DIAGNOSIS — D631 Anemia in chronic kidney disease: Secondary | ICD-10-CM | POA: Diagnosis not present

## 2015-06-14 DIAGNOSIS — N186 End stage renal disease: Secondary | ICD-10-CM | POA: Diagnosis not present

## 2015-06-14 DIAGNOSIS — E118 Type 2 diabetes mellitus with unspecified complications: Secondary | ICD-10-CM | POA: Diagnosis not present

## 2015-06-17 ENCOUNTER — Encounter: Payer: Self-pay | Admitting: Vascular Surgery

## 2015-06-17 DIAGNOSIS — E118 Type 2 diabetes mellitus with unspecified complications: Secondary | ICD-10-CM | POA: Diagnosis not present

## 2015-06-17 DIAGNOSIS — Z23 Encounter for immunization: Secondary | ICD-10-CM | POA: Diagnosis not present

## 2015-06-17 DIAGNOSIS — D631 Anemia in chronic kidney disease: Secondary | ICD-10-CM | POA: Diagnosis not present

## 2015-06-17 DIAGNOSIS — N186 End stage renal disease: Secondary | ICD-10-CM | POA: Diagnosis not present

## 2015-06-18 ENCOUNTER — Encounter (HOSPITAL_COMMUNITY): Payer: Medicare Other | Attending: Oncology

## 2015-06-18 DIAGNOSIS — C9000 Multiple myeloma not having achieved remission: Secondary | ICD-10-CM | POA: Insufficient documentation

## 2015-06-18 DIAGNOSIS — D649 Anemia, unspecified: Secondary | ICD-10-CM | POA: Diagnosis present

## 2015-06-18 LAB — CBC WITH DIFFERENTIAL/PLATELET
BASOS PCT: 1 % (ref 0–1)
Basophils Absolute: 0.1 10*3/uL (ref 0.0–0.1)
Eosinophils Absolute: 0.5 10*3/uL (ref 0.0–0.7)
Eosinophils Relative: 8 % — ABNORMAL HIGH (ref 0–5)
HEMATOCRIT: 39.1 % (ref 39.0–52.0)
HEMOGLOBIN: 12.8 g/dL — AB (ref 13.0–17.0)
LYMPHS ABS: 2.2 10*3/uL (ref 0.7–4.0)
Lymphocytes Relative: 33 % (ref 12–46)
MCH: 29.2 pg (ref 26.0–34.0)
MCHC: 32.7 g/dL (ref 30.0–36.0)
MCV: 89.1 fL (ref 78.0–100.0)
MONO ABS: 0.7 10*3/uL (ref 0.1–1.0)
MONOS PCT: 10 % (ref 3–12)
Neutro Abs: 3.2 10*3/uL (ref 1.7–7.7)
Neutrophils Relative %: 48 % (ref 43–77)
PLATELETS: 239 10*3/uL (ref 150–400)
RBC: 4.39 MIL/uL (ref 4.22–5.81)
RDW: 14.8 % (ref 11.5–15.5)
WBC: 6.7 10*3/uL (ref 4.0–10.5)

## 2015-06-18 LAB — COMPREHENSIVE METABOLIC PANEL
ALT: 22 U/L (ref 17–63)
ANION GAP: 11 (ref 5–15)
AST: 23 U/L (ref 15–41)
Albumin: 3.7 g/dL (ref 3.5–5.0)
Alkaline Phosphatase: 93 U/L (ref 38–126)
BUN: 57 mg/dL — AB (ref 6–20)
CO2: 30 mmol/L (ref 22–32)
CREATININE: 7.31 mg/dL — AB (ref 0.61–1.24)
Calcium: 8.3 mg/dL — ABNORMAL LOW (ref 8.9–10.3)
Chloride: 91 mmol/L — ABNORMAL LOW (ref 101–111)
GFR, EST AFRICAN AMERICAN: 9 mL/min — AB (ref >60)
GFR, EST NON AFRICAN AMERICAN: 7 mL/min — AB (ref >60)
GLUCOSE: 97 mg/dL (ref 65–99)
Potassium: 4.6 mmol/L (ref 3.5–5.1)
SODIUM: 132 mmol/L — AB (ref 135–145)
TOTAL PROTEIN: 10.6 g/dL — AB (ref 6.5–8.1)
Total Bilirubin: 0.8 mg/dL (ref 0.3–1.2)

## 2015-06-18 LAB — C-REACTIVE PROTEIN: CRP: 0.8 mg/dL (ref <1.0)

## 2015-06-18 LAB — SEDIMENTATION RATE: Sed Rate: 56 mm/hr — ABNORMAL HIGH (ref 0–16)

## 2015-06-18 LAB — LACTATE DEHYDROGENASE: LDH: 197 U/L — ABNORMAL HIGH (ref 98–192)

## 2015-06-18 NOTE — Progress Notes (Signed)
LABS DRAWN

## 2015-06-19 ENCOUNTER — Ambulatory Visit (HOSPITAL_COMMUNITY)
Admission: RE | Admit: 2015-06-19 | Discharge: 2015-06-19 | Disposition: A | Discharge status: Home / Self Care | Payer: Medicare Other | Source: Ambulatory Visit | Attending: Vascular Surgery | Admitting: Vascular Surgery

## 2015-06-19 ENCOUNTER — Other Ambulatory Visit (HOSPITAL_COMMUNITY): Payer: Self-pay

## 2015-06-19 ENCOUNTER — Encounter: Payer: Self-pay | Admitting: Vascular Surgery

## 2015-06-19 ENCOUNTER — Ambulatory Visit (INDEPENDENT_AMBULATORY_CARE_PROVIDER_SITE_OTHER): Payer: Medicare Other | Admitting: Vascular Surgery

## 2015-06-19 ENCOUNTER — Other Ambulatory Visit: Payer: Self-pay

## 2015-06-19 VITALS — BP 142/78 | HR 84 | Temp 98.1°F | Ht 67.0 in | Wt 198.5 lb

## 2015-06-19 DIAGNOSIS — N186 End stage renal disease: Secondary | ICD-10-CM | POA: Insufficient documentation

## 2015-06-19 DIAGNOSIS — Z23 Encounter for immunization: Secondary | ICD-10-CM | POA: Diagnosis not present

## 2015-06-19 DIAGNOSIS — Z992 Dependence on renal dialysis: Secondary | ICD-10-CM

## 2015-06-19 DIAGNOSIS — D631 Anemia in chronic kidney disease: Secondary | ICD-10-CM | POA: Diagnosis not present

## 2015-06-19 DIAGNOSIS — E118 Type 2 diabetes mellitus with unspecified complications: Secondary | ICD-10-CM | POA: Diagnosis not present

## 2015-06-19 LAB — BETA 2 MICROGLOBULIN, SERUM: Beta-2 Microglobulin: 18.5 mg/L — ABNORMAL HIGH (ref 0.6–2.4)

## 2015-06-19 LAB — KAPPA/LAMBDA LIGHT CHAINS
KAPPA FREE LGHT CHN: 186.73 mg/L — AB (ref 3.30–19.40)
Kappa, lambda light chain ratio: 3.95 — ABNORMAL HIGH (ref 0.26–1.65)
LAMDA FREE LIGHT CHAINS: 47.27 mg/L — AB (ref 5.71–26.30)

## 2015-06-19 LAB — MULTIPLE MYELOMA PANEL, SERUM
Albumin SerPl Elph-Mcnc: 4 g/dL (ref 2.9–4.4)
Albumin/Glob SerPl: 0.7 (ref 0.7–1.7)
Alpha 1: 0.2 g/dL (ref 0.0–0.4)
Alpha2 Glob SerPl Elph-Mcnc: 1 g/dL (ref 0.4–1.0)
B-GLOBULIN SERPL ELPH-MCNC: 1.1 g/dL (ref 0.7–1.3)
GAMMA GLOB SERPL ELPH-MCNC: 3.6 g/dL — AB (ref 0.4–1.8)
Globulin, Total: 5.8 g/dL — ABNORMAL HIGH (ref 2.2–3.9)
IGG (IMMUNOGLOBIN G), SERUM: 4914 mg/dL — AB (ref 700–1600)
IGM, SERUM: 32 mg/dL (ref 20–172)
IgA: 110 mg/dL (ref 90–386)
M Protein SerPl Elph-Mcnc: 3.3 g/dL — ABNORMAL HIGH
TOTAL PROTEIN ELP: 9.8 g/dL — AB (ref 6.0–8.5)

## 2015-06-19 NOTE — Progress Notes (Signed)
Patient is a 59 year old male who presents for follow-up today for placement of a left radiocephalic AV fistula on [id number]. He denies any numbness or tingling in his hand. He currently is dialyzing via a right-sided catheter. Status Tuesday Thursday Saturday.  Physical exam:  Filed Vitals:   06/19/15 1012 06/19/15 1014  BP: 142/82 142/78  Pulse: 84   Temp: 98.1 F (36.7 C)   TempSrc: Oral   Height: 5\' 7"  (1.702 m)   Weight: 198 lb 8 oz (90.039 kg)   SpO2: 100%     Left upper extremity: Palpable thrill in fistula fistula is palpable over the proximal third and the distal third. The midforearm portion is a little bit more difficult to palpate.  Data: Patient had a duplex ultrasound of his fistula today. Depth was less than 3 mm throughout its course. Overall the fistula is about 5 mm in diameter. There was possible narrowing in the distal forearm.  Assessment: A 5 mm left radiocephalic AV fistula. There may be some narrowing in the proximal vein.  Plan: Fistulogram possible intervention scheduled for 07/04/2015. The patient had several appointments prior to this so he wished to defer until then.  Risks benefits possible palpitations and procedure details of fistulogram possible angioplasty were discussed with the patient today. These included but were not limited to bleeding infection loss of fistula.  Ruta Hinds, MD [facility] [place] Office: [phone] Pager: [phone]

## 2015-06-21 DIAGNOSIS — N186 End stage renal disease: Secondary | ICD-10-CM | POA: Diagnosis not present

## 2015-06-21 DIAGNOSIS — D631 Anemia in chronic kidney disease: Secondary | ICD-10-CM | POA: Diagnosis not present

## 2015-06-21 DIAGNOSIS — Z23 Encounter for immunization: Secondary | ICD-10-CM | POA: Diagnosis not present

## 2015-06-21 DIAGNOSIS — E118 Type 2 diabetes mellitus with unspecified complications: Secondary | ICD-10-CM | POA: Diagnosis not present

## 2015-06-24 DIAGNOSIS — E118 Type 2 diabetes mellitus with unspecified complications: Secondary | ICD-10-CM | POA: Diagnosis not present

## 2015-06-24 DIAGNOSIS — D631 Anemia in chronic kidney disease: Secondary | ICD-10-CM | POA: Diagnosis not present

## 2015-06-24 DIAGNOSIS — Z23 Encounter for immunization: Secondary | ICD-10-CM | POA: Diagnosis not present

## 2015-06-24 DIAGNOSIS — N186 End stage renal disease: Secondary | ICD-10-CM | POA: Diagnosis not present

## 2015-06-26 DIAGNOSIS — Z23 Encounter for immunization: Secondary | ICD-10-CM | POA: Diagnosis not present

## 2015-06-26 DIAGNOSIS — E118 Type 2 diabetes mellitus with unspecified complications: Secondary | ICD-10-CM | POA: Diagnosis not present

## 2015-06-26 DIAGNOSIS — D631 Anemia in chronic kidney disease: Secondary | ICD-10-CM | POA: Diagnosis not present

## 2015-06-26 DIAGNOSIS — N186 End stage renal disease: Secondary | ICD-10-CM | POA: Diagnosis not present

## 2015-06-27 ENCOUNTER — Encounter (HOSPITAL_COMMUNITY): Payer: Self-pay | Admitting: Hematology & Oncology

## 2015-06-27 ENCOUNTER — Encounter (HOSPITAL_BASED_OUTPATIENT_CLINIC_OR_DEPARTMENT_OTHER): Payer: Medicare Other | Admitting: Hematology & Oncology

## 2015-06-27 VITALS — BP 96/59 | HR 79 | Temp 98.9°F | Resp 16 | Wt 197.0 lb

## 2015-06-27 DIAGNOSIS — N051 Unspecified nephritic syndrome with focal and segmental glomerular lesions: Secondary | ICD-10-CM

## 2015-06-27 DIAGNOSIS — D472 Monoclonal gammopathy: Secondary | ICD-10-CM

## 2015-06-27 DIAGNOSIS — N186 End stage renal disease: Secondary | ICD-10-CM

## 2015-06-27 DIAGNOSIS — C9000 Multiple myeloma not having achieved remission: Secondary | ICD-10-CM

## 2015-06-27 DIAGNOSIS — Z992 Dependence on renal dialysis: Secondary | ICD-10-CM

## 2015-06-27 NOTE — Patient Instructions (Addendum)
[place] at [facility] Discharge Instructions  RECOMMENDATIONS MADE BY THE CONSULTANT AND ANY TEST RESULTS WILL BE SENT TO YOUR REFERRING PHYSICIAN.  Exam completed by Dr Whitney Muse today Return in 2 weeks to see the doctor Please call the clinic if you have any questions or concerns  Thank you for choosing [place] at [facility] to provide your oncology and hematology care.  To afford each patient quality time with our provider, please arrive at least 15 minutes before your scheduled appointment time.    You need to re-schedule your appointment should you arrive 10 or more minutes late.  We strive to give you quality time with our providers, and arriving late affects you and other patients whose appointments are after yours.  Also, if you no show three or more times for appointments you may be dismissed from the clinic at the providers discretion.     Again, thank you for choosing [facility].  Our hope is that these requests will decrease the amount of time that you wait before being seen by our physicians.       _____________________________________________________________  Should you have questions after your visit to [facility], please contact our office at [phone] between the hours of 8:30 a.m. and 4:30 p.m.  Voicemails left after 4:30 p.m. will not be returned until the following business day.  For prescription refill requests, have your pharmacy contact our office.

## 2015-06-27 NOTE — Progress Notes (Signed)
No primary care provider on file. No primary provider on file.  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 male is seen at the [facility] for follow-up of smoldering myeloma.   I personally reviewed and went over pathology results with the patient.  Renal biopsy does not demonstrate any monoclonal protein.  Bone marrow aspiration and biopsy noted and reviewed.  He does have 11% plasma cell on testing.  He continues on dialysis and has no major complaints. He has been on dialysis for about 2 months.  His schedule is Tuesdays, Thursdays, and Saturday here in [place].   Past Medical History  Diagnosis Date  . Hypertension   . CHF (congestive heart failure)   . Diabetes mellitus without complication     Type II  . Chronic kidney disease     TTHS    has Peripheral edema; CHF (congestive heart failure); HTN (hypertension); DM (diabetes mellitus); Acute renal failure; Anemia; Hypokalemia; Congestive heart disease; Hypoxia; Acute on chronic diastolic CHF (congestive heart failure), NYHA class 4; and Multiple myeloma on his problem list.     has No Known Allergies.  Current Outpatient Prescriptions on File Prior to Visit  Medication Sig Dispense Refill  . calcium acetate (PHOSLO) 667 MG capsule Take 2 capsules (1,334 mg total) by mouth 3 (three) times daily with meals. 90 capsule 0  . Darbepoetin Alfa (ARANESP) 100 MCG/0.5ML SOSY injection Inject 0.5 mLs (100 mcg total) into the vein every Thursday with hemodialysis. Will be given at dialysis.    Marland Kitchen gabapentin (NEURONTIN) 300 MG capsule Take 1 capsule (300 mg total) by mouth at bedtime. 30 capsule 0  . hydrALAZINE (APRESOLINE) 25 MG tablet Take 1 tablet (25 mg total) by mouth 2 (two) times daily. 60 tablet 0  . isosorbide mononitrate (IMDUR) 60 MG 24 hr tablet Take 1 tablet (60 mg total) by mouth daily. 30 tablet 0  . metoprolol  tartrate (LOPRESSOR) 25 MG tablet Take 2 tablets (50 mg total) by mouth 2 (two) times daily. 60 tablet 0  . furosemide (LASIX) 80 MG tablet Take 1 tablet (80 mg total) by mouth 2 (two) times daily. (Patient not taking: Reported on 05/08/2015) 30 tablet 0  . Nutritional Supplements (FEEDING SUPPLEMENT, NEPRO CARB STEADY,) LIQD Take 237 mLs by mouth daily.     No current facility-administered medications on file prior to visit.    Past Surgical History  Procedure Laterality Date  . Colonoscopy    . Av fistula placement Left 04/08/2015    Procedure: LEFT RADIOCEPHALIC ARTERIOVENOUS (AV) FISTULA CREATION;  Surgeon: Elam Dutch, MD;  Location: [place];  Service: Vascular;  Laterality: Left;  . Peripheral vascular catheterization N/A 07/04/2015    Procedure: Fistulagram;  Surgeon: Elam Dutch, MD;  Location: [place] [facility];  Service: Cardiovascular;  Laterality: N/A;  . Ligation of competing branches of arteriovenous fistula Left 07/16/2015    Procedure: LIGATION OF SIDE BRANCHES OF ARTERIOVENOUS FISTULA LEFT ARM;  Surgeon: Rosetta Posner, MD;  Location: [facility] OR;  Service: Vascular;  Laterality: Left;    Denies any headaches, dizziness, double vision, fevers, chills, night sweats, nausea, vomiting, diarrhea, constipation, chest pain, heart palpitations, shortness of breath, blood in stool, black tarry stool, urinary pain, urinary burning, urinary frequency, hematuria. 14 point review of systems was performed and is negative except as detailed under history of present illness and above  PHYSICAL EXAMINATION  ECOG PERFORMANCE STATUS: 0 - Asymptomatic  Filed Vitals:   06/27/15 1200  BP: 96/59  Pulse: 79  Temp: 98.9 F (37.2 C)  Resp: 16    GENERAL:alert, no distress, well nourished, well developed, comfortable, cooperative and smiling Dialysis catheter in right upper clavicle   Fistula in left arm  SKIN: skin color, texture, turgor are normal, no rashes or significant lesions HEAD:  Normocephalic, No masses, lesions, tenderness or abnormalities EYES: normal, PERRLA, EOMI, Conjunctiva are pink and non-injected EARS: External ears normal OROPHARYNX:lips, buccal mucosa, and tongue normal, edentulous and mucous membranes are moist  NECK: supple, no adenopathy, thyroid normal size, non-tender, without nodularity, no stridor, non-tender, trachea midline LYMPH:  no palpable lymphadenopathy, no hepatosplenomegaly BREAST:not examined LUNGS: positive findings: wheezing  diffusely HEART: regular rate & rhythm, no murmurs, no gallops, S1 normal and S2 normal ABDOMEN:abdomen soft, non-tender, normal bowel sounds, no masses or organomegaly and no hepatosplenomegaly BACK: Back symmetric, no curvature., No CVA tenderness EXTREMITIES:less then 2 second capillary refill, no joint deformities, effusion, or inflammation, no edema, no skin discoloration, no clubbing, no cyanosis  NEURO: alert & oriented x 3 with fluent speech, no focal motor/sensory deficits, gait normal   LABORATORY DATA: CBC    Component Value Date/Time   WBC 6.7 06/18/2015 0947   RBC 4.39 06/18/2015 0947   RBC 3.18* 03/23/2015 0330   HGB 13.3 07/16/2015 0725   HCT 39.0 07/16/2015 0725   PLT 239 06/18/2015 0947   MCV 89.1 06/18/2015 0947   MCH 29.2 06/18/2015 0947   MCHC 32.7 06/18/2015 0947   RDW 14.8 06/18/2015 0947   LYMPHSABS 2.2 06/18/2015 0947   MONOABS 0.7 06/18/2015 0947   EOSABS 0.5 06/18/2015 0947   BASOSABS 0.1 06/18/2015 0947      Chemistry      Component Value Date/Time   NA 132* 07/16/2015 0725   K 4.6 07/16/2015 0725   CL 91* 06/18/2015 0947   CO2 30 06/18/2015 0947   BUN 57* 06/18/2015 0947   CREATININE 7.31* 06/18/2015 0947      Component Value Date/Time   CALCIUM 8.3* 06/18/2015 0947   ALKPHOS 93 06/18/2015 0947   AST 23 06/18/2015 0947   ALT 22 06/18/2015 0947   BILITOT 0.8 06/18/2015 0947      RADIOLOGY:  CLINICAL DATA: Metastatic bone survey, multiple  myeloma  EXAM: METASTATIC BONE SURVEY  COMPARISON: 03/22/2015  FINDINGS: Twenty-three images of skeletal structures submitted. Frontal view of the chest shows no destructive bony lesions. There is small left pleural effusion with left basilar atelectasis or infiltrate. Frontal and lateral view of the skull shows no lytic or blastic bone lesions. Frontal view bilateral shoulder and bilateral humerus shows no lytic or blastic lesions. Frontal and lateral view of the cervical spine shows the alignment preserved. No lytic or blastic lesions.  Frontal and lateral view of thoracic spine and lumbar spine shows alignment preserved. No lytic or blastic lesions. Frontal view bilateral hips and femur shows no lytic or blastic lesions. Frontal view bilateral tibia and fibula shows no lytic or blastic lesions. Bilateral forearm shows no lytic or blastic lesion.  IMPRESSION: No lytic or blastic lesions are identified. No evidence of metastatic disease. There is small left pleural effusion left basilar atelectasis or infiltrate.   Electronically Signed  By: Lahoma Crocker M.D.  On: 03/28/2015 14:27     PATHOLOGY:  Diagnosis Bone Marrow, Aspirate,Biopsy, and Clot, right iliac - NORMOCELLULAR BONE MARROW FOR AGE WITH PLASMA CELL NEOPLASM. -  TRILINEAGE HEMATOPOIESIS. - SEE COMMENT. PERIPHERAL BLOOD: - NORMOCYTIC-NORMOCHROMIC ANEMIA Diagnosis Note The bone marrow is generally normocellular for age with trilineage hematopoiesis and nonspecific myeloid changes. However, the plasma cells are increased in number representing 11% of all cells associated with atypical cytomorphologic features. Immunohistochemical stains show that the plasma cells are kappa light chain restricted consistent with plasma cell neoplasm. Correlation with cytogenetic studies is recommended. (BNS:kh/ds 04-03-15) Susanne Greenhouse MD Pathologist, Electronic Signature (Case signed 04/04/2015)   Diagnosis Kidney,  biopsy w/ EM l, left cortex COLLAPSING VARIANT OF FOCAL AND SEGMENTAL GLOMERULOSCLEROSIS. DIABETIC GLOMERULOSCLEROSIS. MODERATE ARTERIONEPHROSLCEROSIS. NO EVIDENCE OF A MONOCLONAL IMMUNOGLOBULIN DEPOSITION DISEASE Diagnosis Note Diagnostic evaluation conducted by [facility]. Candiss Norse, MD @ [facility] Nephropathology; [id number] Claudette Laws MD Pathologist, Electronic Signature (Case signed 04/10/2015)    ASSESSMENT AND PLAN:  Smoldering myeloma End-stage renal disease on dialysis Kidney biopsy on 04/02/2015 with collapsing FSGS Negative Myeloma Survey  It is felt that his end-stage renal disease is due to collapsing FSGS. He did have a renal biopsy in April that showed collapsing FSGS that is felt to be explanatory of his renal course. I have discussed his case with Dr. Melba Coon at [facility] who does not feel that the patient's M protein is contributing to his renal disease. Therefore we will continue to hold off on calling him "active myeloma" and/or treating him at this time. His anemia can most certainly be related to his end-stage renal disease and dialysis.  I would recommend close observation. We will see him back again in several weeks. We may consider PET imaging, I will address this with him at his next visit.   All questions were answered. The patient knows to call the clinic with any problems, questions or concerns. We can certainly see the patient much sooner if necessary.   This document serves as a record of services personally performed by Ancil Linsey, MD. It was created on her behalf by Janace Hoard, a trained medical scribe. The creation of this record is based on the scribe's personal observations and the provider's statements to them. This document has been checked and approved by the attending provider.  I have reviewed the above documentation for accuracy and completeness, and I agree with the above.  This note is electronically signed.  Kelby Fam. Whitney Muse, MD

## 2015-06-28 DIAGNOSIS — N186 End stage renal disease: Secondary | ICD-10-CM | POA: Diagnosis not present

## 2015-06-28 DIAGNOSIS — E118 Type 2 diabetes mellitus with unspecified complications: Secondary | ICD-10-CM | POA: Diagnosis not present

## 2015-06-28 DIAGNOSIS — Z23 Encounter for immunization: Secondary | ICD-10-CM | POA: Diagnosis not present

## 2015-06-28 DIAGNOSIS — D631 Anemia in chronic kidney disease: Secondary | ICD-10-CM | POA: Diagnosis not present

## 2015-07-01 DIAGNOSIS — Z23 Encounter for immunization: Secondary | ICD-10-CM | POA: Diagnosis not present

## 2015-07-01 DIAGNOSIS — E118 Type 2 diabetes mellitus with unspecified complications: Secondary | ICD-10-CM | POA: Diagnosis not present

## 2015-07-01 DIAGNOSIS — D631 Anemia in chronic kidney disease: Secondary | ICD-10-CM | POA: Diagnosis not present

## 2015-07-01 DIAGNOSIS — N186 End stage renal disease: Secondary | ICD-10-CM | POA: Diagnosis not present

## 2015-07-03 DIAGNOSIS — N186 End stage renal disease: Secondary | ICD-10-CM | POA: Diagnosis not present

## 2015-07-03 DIAGNOSIS — E118 Type 2 diabetes mellitus with unspecified complications: Secondary | ICD-10-CM | POA: Diagnosis not present

## 2015-07-03 DIAGNOSIS — D631 Anemia in chronic kidney disease: Secondary | ICD-10-CM | POA: Diagnosis not present

## 2015-07-03 DIAGNOSIS — Z23 Encounter for immunization: Secondary | ICD-10-CM | POA: Diagnosis not present

## 2015-07-04 ENCOUNTER — Ambulatory Visit (HOSPITAL_COMMUNITY)
Admission: RE | Admit: 2015-07-04 | Discharge: 2015-07-04 | Disposition: A | Discharge status: Home / Self Care | Payer: Medicare Other | Source: Ambulatory Visit | Attending: Vascular Surgery | Admitting: Vascular Surgery

## 2015-07-04 ENCOUNTER — Encounter (HOSPITAL_COMMUNITY)
Admission: RE | Disposition: A | Discharge status: Home / Self Care | Payer: Self-pay | Source: Ambulatory Visit | Attending: Vascular Surgery

## 2015-07-04 ENCOUNTER — Other Ambulatory Visit: Payer: Self-pay

## 2015-07-04 ENCOUNTER — Encounter (HOSPITAL_COMMUNITY): Payer: Self-pay | Admitting: Vascular Surgery

## 2015-07-04 DIAGNOSIS — T82590A Other mechanical complication of surgically created arteriovenous fistula, initial encounter: Secondary | ICD-10-CM | POA: Diagnosis not present

## 2015-07-04 DIAGNOSIS — Z992 Dependence on renal dialysis: Secondary | ICD-10-CM | POA: Insufficient documentation

## 2015-07-04 DIAGNOSIS — T82898A Other specified complication of vascular prosthetic devices, implants and grafts, initial encounter: Secondary | ICD-10-CM | POA: Diagnosis not present

## 2015-07-04 DIAGNOSIS — Z09 Encounter for follow-up examination after completed treatment for conditions other than malignant neoplasm: Secondary | ICD-10-CM | POA: Diagnosis present

## 2015-07-04 HISTORY — PX: PERIPHERAL VASCULAR CATHETERIZATION: SHX172C

## 2015-07-04 LAB — GLUCOSE, CAPILLARY: Glucose-Capillary: 84 mg/dL (ref 65–99)

## 2015-07-04 SURGERY — A/V SHUNTOGRAM/FISTULAGRAM

## 2015-07-04 MED ORDER — SODIUM CHLORIDE 0.9 % IJ SOLN
3.0000 mL | INTRAMUSCULAR | Status: DC | PRN
  Administered 2015-07-04: 3 mL via INTRAVENOUS
  Filled 2015-07-04: qty 3

## 2015-07-04 MED ORDER — ONDANSETRON HCL 4 MG/2ML IJ SOLN
4.0000 mg | Freq: Four times a day (QID) | INTRAMUSCULAR | Status: DC | PRN
Start: 2015-07-04 — End: 2015-07-04

## 2015-07-04 MED ORDER — LIDOCAINE HCL (PF) 1 % IJ SOLN
INTRAMUSCULAR | Status: AC
  Filled 2015-07-04: qty 30

## 2015-07-04 MED ORDER — HEPARIN (PORCINE) IN NACL 2-0.9 UNIT/ML-% IJ SOLN
INTRAMUSCULAR | Status: AC
  Filled 2015-07-04: qty 500

## 2015-07-04 MED ORDER — DOCUSATE SODIUM 100 MG PO CAPS: 100.0000 mg | ORAL_CAPSULE | Freq: Every day | ORAL | Status: DC

## 2015-07-04 MED ORDER — HEPARIN (PORCINE) IN NACL 2-0.9 UNIT/ML-% IJ SOLN
INTRAMUSCULAR | Status: DC | PRN
  Administered 2015-07-04: 09:00:00

## 2015-07-04 MED ORDER — HYDRALAZINE HCL 20 MG/ML IJ SOLN
5.0000 mg | INTRAMUSCULAR | Status: DC | PRN
Start: 2015-07-04 — End: 2015-07-04

## 2015-07-04 MED ORDER — ACETAMINOPHEN 325 MG PO TABS: 325.0000 mg | ORAL_TABLET | ORAL | Status: DC | PRN

## 2015-07-04 MED ORDER — ACETAMINOPHEN 325 MG RE SUPP: 325.0000 mg | RECTAL | Status: DC | PRN

## 2015-07-04 MED ORDER — METOPROLOL TARTRATE 1 MG/ML IV SOLN: 2.0000 mg | INTRAVENOUS | Status: DC | PRN

## 2015-07-04 MED ORDER — MORPHINE SULFATE 10 MG/ML IJ SOLN: 2.0000 mg | INTRAMUSCULAR | Status: DC | PRN

## 2015-07-04 MED ORDER — LABETALOL HCL 5 MG/ML IV SOLN: 10.0000 mg | INTRAVENOUS | Status: DC | PRN

## 2015-07-04 SURGICAL SUPPLY — 6 items
COVER PRB 48X5XTLSCP FOLD TPE (BAG) ×1 IMPLANT
COVER PROBE 5X48 (BAG) ×1
KIT MICROINTRODUCER STIFF 5F (SHEATH) ×2 IMPLANT
STOPCOCK MORSE 400PSI 3WAY (MISCELLANEOUS) ×2 IMPLANT
TRAY PV CATH (CUSTOM PROCEDURE TRAY) ×2 IMPLANT
TUBING CIL FLEX 10 FLL-RA (TUBING) ×2 IMPLANT

## 2015-07-04 NOTE — Interval H&P Note (Signed)
History and Physical Interval Note:  07/04/2015 8:21 AM  James [name]  has presented today for surgery, with the diagnosis of instage renal  The various methods of treatment have been discussed with the patient and family. After consideration of risks, benefits and other options for treatment, the patient has consented to  Procedure(s): Fistulagram (N/A) as a surgical intervention .  The patient's history has been reviewed, patient examined, no change in status, stable for surgery.  I have reviewed the patient's chart and labs.  Questions were answered to the patient's satisfaction.     Ruta Hinds

## 2015-07-04 NOTE — Discharge Instructions (Signed)
Fistulogram, Care After °Refer to this sheet in the next few weeks. These instructions provide you with information on caring for yourself after your procedure. Your health care provider may also give you more specific instructions. Your treatment has been planned according to current medical practices, but problems sometimes occur. Call your health care provider if you have any problems or questions after your procedure. °WHAT TO EXPECT AFTER THE PROCEDURE °After your procedure, it is typical to have the following: °· A small amount of discomfort in the area where the catheters were placed. °· A small amount of bruising around the fistula. °· Sleepiness and fatigue. °HOME CARE INSTRUCTIONS °· Rest at home for the day following your procedure. °· Do not drive or operate heavy machinery while taking pain medicine. °· Take medicines only as directed by your health care provider. °· Do not take baths, swim, or use a hot tub until your health care provider approves. You may shower 24 hours after the procedure or as directed by your health care provider. °· There are many different ways to close and cover an incision, including stitches, skin glue, and adhesive strips. Follow your health care provider's instructions on: °¨ Incision care. °¨ Bandage (dressing) changes and removal. °¨ Incision closure removal. °· Monitor your dialysis fistula carefully. °SEEK MEDICAL CARE IF: °· You have drainage, redness, swelling, or pain at your catheter site. °· You have a fever. °· You have chills. °SEEK IMMEDIATE MEDICAL CARE IF: °· You feel weak. °· You have trouble balancing. °· You have trouble moving your arms or legs. °· You have problems with your speech or vision. °· You can no longer feel a vibration or buzz when you put your fingers over your dialysis fistula. °· The limb that was used for the procedure: °¨ Swells. °¨ Is painful. °¨ Is cold. °¨ Is discolored, such as blue or pale white. °Document Released: 04/08/2014  Document Reviewed: 01/11/2014 °ExitCare® Patient Information ©2015 [employer]. This information is not intended to replace advice given to you by your health care provider. Make sure you discuss any questions you have with your health care provider. ° °

## 2015-07-04 NOTE — H&P (View-Only) (Signed)
Patient is a 59 year old male who presents for follow-up today for placement of a left radiocephalic AV fistula on [id number]. He denies any numbness or tingling in his hand. He currently is dialyzing via a right-sided catheter. Status Tuesday Thursday Saturday.  Physical exam:  Filed Vitals:   06/19/15 1012 06/19/15 1014  BP: 142/82 142/78  Pulse: 84   Temp: 98.1 F (36.7 C)   TempSrc: Oral   Height: 5\' 7"  (1.702 m)   Weight: 198 lb 8 oz (90.039 kg)   SpO2: 100%     Left upper extremity: Palpable thrill in fistula fistula is palpable over the proximal third and the distal third. The midforearm portion is a little bit more difficult to palpate.  Data: Patient had a duplex ultrasound of his fistula today. Depth was less than 3 mm throughout its course. Overall the fistula is about 5 mm in diameter. There was possible narrowing in the distal forearm.  Assessment: A 5 mm left radiocephalic AV fistula. There may be some narrowing in the proximal vein.  Plan: Fistulogram possible intervention scheduled for 07/04/2015. The patient had several appointments prior to this so he wished to defer until then.  Risks benefits possible palpitations and procedure details of fistulogram possible angioplasty were discussed with the patient today. These included but were not limited to bleeding infection loss of fistula.  Ruta Hinds, MD [facility] [place] Office: [phone] Pager: [phone]

## 2015-07-05 DIAGNOSIS — Z23 Encounter for immunization: Secondary | ICD-10-CM | POA: Diagnosis not present

## 2015-07-05 DIAGNOSIS — N186 End stage renal disease: Secondary | ICD-10-CM | POA: Diagnosis not present

## 2015-07-05 DIAGNOSIS — E118 Type 2 diabetes mellitus with unspecified complications: Secondary | ICD-10-CM | POA: Diagnosis not present

## 2015-07-05 DIAGNOSIS — D631 Anemia in chronic kidney disease: Secondary | ICD-10-CM | POA: Diagnosis not present

## 2015-07-06 DIAGNOSIS — Z992 Dependence on renal dialysis: Secondary | ICD-10-CM | POA: Diagnosis not present

## 2015-07-06 DIAGNOSIS — N041 Nephrotic syndrome with focal and segmental glomerular lesions: Secondary | ICD-10-CM | POA: Diagnosis not present

## 2015-07-06 DIAGNOSIS — N186 End stage renal disease: Secondary | ICD-10-CM | POA: Diagnosis not present

## 2015-07-08 DIAGNOSIS — D509 Iron deficiency anemia, unspecified: Secondary | ICD-10-CM | POA: Diagnosis not present

## 2015-07-08 DIAGNOSIS — N186 End stage renal disease: Secondary | ICD-10-CM | POA: Diagnosis not present

## 2015-07-08 DIAGNOSIS — N2581 Secondary hyperparathyroidism of renal origin: Secondary | ICD-10-CM | POA: Diagnosis not present

## 2015-07-08 DIAGNOSIS — D631 Anemia in chronic kidney disease: Secondary | ICD-10-CM | POA: Diagnosis not present

## 2015-07-08 DIAGNOSIS — E118 Type 2 diabetes mellitus with unspecified complications: Secondary | ICD-10-CM | POA: Diagnosis not present

## 2015-07-10 DIAGNOSIS — D509 Iron deficiency anemia, unspecified: Secondary | ICD-10-CM | POA: Diagnosis not present

## 2015-07-10 DIAGNOSIS — D631 Anemia in chronic kidney disease: Secondary | ICD-10-CM | POA: Diagnosis not present

## 2015-07-10 DIAGNOSIS — N2581 Secondary hyperparathyroidism of renal origin: Secondary | ICD-10-CM | POA: Diagnosis not present

## 2015-07-10 DIAGNOSIS — E118 Type 2 diabetes mellitus with unspecified complications: Secondary | ICD-10-CM | POA: Diagnosis not present

## 2015-07-10 DIAGNOSIS — N186 End stage renal disease: Secondary | ICD-10-CM | POA: Diagnosis not present

## 2015-07-11 ENCOUNTER — Encounter (HOSPITAL_COMMUNITY): Payer: Self-pay | Admitting: *Deleted

## 2015-07-12 DIAGNOSIS — N2581 Secondary hyperparathyroidism of renal origin: Secondary | ICD-10-CM | POA: Diagnosis not present

## 2015-07-12 DIAGNOSIS — D509 Iron deficiency anemia, unspecified: Secondary | ICD-10-CM | POA: Diagnosis not present

## 2015-07-12 DIAGNOSIS — D631 Anemia in chronic kidney disease: Secondary | ICD-10-CM | POA: Diagnosis not present

## 2015-07-12 DIAGNOSIS — N186 End stage renal disease: Secondary | ICD-10-CM | POA: Diagnosis not present

## 2015-07-12 DIAGNOSIS — E118 Type 2 diabetes mellitus with unspecified complications: Secondary | ICD-10-CM | POA: Diagnosis not present

## 2015-07-14 ENCOUNTER — Ambulatory Visit (HOSPITAL_COMMUNITY): Payer: Medicare Other | Admitting: Hematology & Oncology

## 2015-07-15 DIAGNOSIS — D509 Iron deficiency anemia, unspecified: Secondary | ICD-10-CM | POA: Diagnosis not present

## 2015-07-15 DIAGNOSIS — N186 End stage renal disease: Secondary | ICD-10-CM | POA: Diagnosis not present

## 2015-07-15 DIAGNOSIS — E118 Type 2 diabetes mellitus with unspecified complications: Secondary | ICD-10-CM | POA: Diagnosis not present

## 2015-07-15 DIAGNOSIS — D631 Anemia in chronic kidney disease: Secondary | ICD-10-CM | POA: Diagnosis not present

## 2015-07-15 DIAGNOSIS — N2581 Secondary hyperparathyroidism of renal origin: Secondary | ICD-10-CM | POA: Diagnosis not present

## 2015-07-16 ENCOUNTER — Encounter (HOSPITAL_COMMUNITY): Payer: Self-pay | Admitting: Certified Registered Nurse Anesthetist

## 2015-07-16 ENCOUNTER — Ambulatory Visit (HOSPITAL_COMMUNITY)
Admission: RE | Admit: 2015-07-16 | Discharge: 2015-07-16 | Disposition: A | Discharge status: Home / Self Care | Payer: Medicare Other | Source: Ambulatory Visit | Attending: Vascular Surgery | Admitting: Vascular Surgery

## 2015-07-16 ENCOUNTER — Ambulatory Visit (HOSPITAL_COMMUNITY): Payer: Medicare Other | Admitting: Certified Registered Nurse Anesthetist

## 2015-07-16 ENCOUNTER — Encounter (HOSPITAL_COMMUNITY)
Admission: RE | Disposition: A | Discharge status: Home / Self Care | Payer: Self-pay | Source: Ambulatory Visit | Attending: Vascular Surgery

## 2015-07-16 DIAGNOSIS — I120 Hypertensive chronic kidney disease with stage 5 chronic kidney disease or end stage renal disease: Secondary | ICD-10-CM | POA: Insufficient documentation

## 2015-07-16 DIAGNOSIS — Z992 Dependence on renal dialysis: Secondary | ICD-10-CM | POA: Diagnosis not present

## 2015-07-16 DIAGNOSIS — I252 Old myocardial infarction: Secondary | ICD-10-CM | POA: Insufficient documentation

## 2015-07-16 DIAGNOSIS — Z87891 Personal history of nicotine dependence: Secondary | ICD-10-CM | POA: Insufficient documentation

## 2015-07-16 DIAGNOSIS — N186 End stage renal disease: Secondary | ICD-10-CM | POA: Diagnosis not present

## 2015-07-16 DIAGNOSIS — I509 Heart failure, unspecified: Secondary | ICD-10-CM | POA: Insufficient documentation

## 2015-07-16 DIAGNOSIS — T82898A Other specified complication of vascular prosthetic devices, implants and grafts, initial encounter: Secondary | ICD-10-CM | POA: Diagnosis not present

## 2015-07-16 DIAGNOSIS — E1122 Type 2 diabetes mellitus with diabetic chronic kidney disease: Secondary | ICD-10-CM | POA: Diagnosis not present

## 2015-07-16 DIAGNOSIS — Z794 Long term (current) use of insulin: Secondary | ICD-10-CM | POA: Diagnosis not present

## 2015-07-16 HISTORY — DX: Heart failure, unspecified: I50.9

## 2015-07-16 HISTORY — PX: LIGATION OF COMPETING BRANCHES OF ARTERIOVENOUS FISTULA: SHX5949

## 2015-07-16 LAB — GLUCOSE, CAPILLARY
GLUCOSE-CAPILLARY: 183 mg/dL — AB (ref 65–99)
Glucose-Capillary: 204 mg/dL — ABNORMAL HIGH (ref 65–99)

## 2015-07-16 LAB — POCT I-STAT 4, (NA,K, GLUC, HGB,HCT)
GLUCOSE: 207 mg/dL — AB (ref 65–99)
HCT: 39 % (ref 39.0–52.0)
Hemoglobin: 13.3 g/dL (ref 13.0–17.0)
Potassium: 4.6 mmol/L (ref 3.5–5.1)
SODIUM: 132 mmol/L — AB (ref 135–145)

## 2015-07-16 SURGERY — LIGATION OF COMPETING BRANCHES OF ARTERIOVENOUS FISTULA
Anesthesia: Monitor Anesthesia Care | Site: Arm Lower | Laterality: Left

## 2015-07-16 MED ORDER — CHLORHEXIDINE GLUCONATE CLOTH 2 % EX PADS: 6.0000 | MEDICATED_PAD | Freq: Once | CUTANEOUS | Status: DC

## 2015-07-16 MED ORDER — DEXTROSE 5 % IV SOLN
INTRAVENOUS | Status: AC
  Filled 2015-07-16: qty 1.5

## 2015-07-16 MED ORDER — DEXTROSE 5 % IV SOLN
1.5000 g | INTRAVENOUS | Status: AC
  Administered 2015-07-16: 1.5 g via INTRAVENOUS

## 2015-07-16 MED ORDER — PROPOFOL 10 MG/ML IV BOLUS
INTRAVENOUS | Status: AC
  Filled 2015-07-16: qty 20

## 2015-07-16 MED ORDER — FENTANYL CITRATE (PF) 100 MCG/2ML IJ SOLN
INTRAMUSCULAR | Status: DC | PRN
  Administered 2015-07-16 (×2): 25 ug via INTRAVENOUS

## 2015-07-16 MED ORDER — PROPOFOL INFUSION 10 MG/ML OPTIME
INTRAVENOUS | Status: DC | PRN
  Administered 2015-07-16: 75 ug/kg/min via INTRAVENOUS

## 2015-07-16 MED ORDER — EPHEDRINE SULFATE 50 MG/ML IJ SOLN
INTRAMUSCULAR | Status: AC
  Filled 2015-07-16: qty 1

## 2015-07-16 MED ORDER — LIDOCAINE-EPINEPHRINE 0.5 %-1:200000 IJ SOLN
INTRAMUSCULAR | Status: DC | PRN
  Administered 2015-07-16: 5 mL

## 2015-07-16 MED ORDER — PHENYLEPHRINE HCL 10 MG/ML IJ SOLN
INTRAMUSCULAR | Status: DC | PRN
  Administered 2015-07-16 (×5): 80 ug via INTRAVENOUS

## 2015-07-16 MED ORDER — MIDAZOLAM HCL 5 MG/5ML IJ SOLN
INTRAMUSCULAR | Status: DC | PRN
  Administered 2015-07-16 (×2): 1 mg via INTRAVENOUS

## 2015-07-16 MED ORDER — SODIUM CHLORIDE 0.9 % IV SOLN
INTRAVENOUS | Status: DC | PRN
  Administered 2015-07-16: 08:00:00 via INTRAVENOUS

## 2015-07-16 MED ORDER — LIDOCAINE-EPINEPHRINE 0.5 %-1:200000 IJ SOLN
INTRAMUSCULAR | Status: AC
  Filled 2015-07-16: qty 1

## 2015-07-16 MED ORDER — ROCURONIUM BROMIDE 50 MG/5ML IV SOLN
INTRAVENOUS | Status: AC
  Filled 2015-07-16: qty 1

## 2015-07-16 MED ORDER — OXYCODONE HCL 5 MG PO TABS: 5.0000 mg | ORAL_TABLET | Freq: Four times a day (QID) | ORAL | Status: DC | PRN

## 2015-07-16 MED ORDER — 0.9 % SODIUM CHLORIDE (POUR BTL) OPTIME
TOPICAL | Status: DC | PRN
  Administered 2015-07-16: 1000 mL

## 2015-07-16 MED ORDER — SUCCINYLCHOLINE CHLORIDE 20 MG/ML IJ SOLN
INTRAMUSCULAR | Status: AC
  Filled 2015-07-16: qty 1

## 2015-07-16 MED ORDER — FENTANYL CITRATE (PF) 250 MCG/5ML IJ SOLN
INTRAMUSCULAR | Status: AC
  Filled 2015-07-16: qty 5

## 2015-07-16 MED ORDER — SODIUM CHLORIDE 0.9 % IV SOLN: INTRAVENOUS | Status: DC

## 2015-07-16 MED ORDER — STERILE WATER FOR INJECTION IJ SOLN
INTRAMUSCULAR | Status: AC
  Filled 2015-07-16: qty 10

## 2015-07-16 MED ORDER — MIDAZOLAM HCL 2 MG/2ML IJ SOLN
INTRAMUSCULAR | Status: AC
  Filled 2015-07-16: qty 4

## 2015-07-16 SURGICAL SUPPLY — 38 items
BENZOIN TINCTURE PRP APPL 2/3 (GAUZE/BANDAGES/DRESSINGS) ×3 IMPLANT
CANISTER SUCTION 2500CC (MISCELLANEOUS) ×3 IMPLANT
CLIP LIGATING EXTRA MED SLVR (CLIP) ×3 IMPLANT
CLIP LIGATING EXTRA SM BLUE (MISCELLANEOUS) ×3 IMPLANT
CLOSURE STERI-STRIP 1/2X4 (GAUZE/BANDAGES/DRESSINGS) ×1
CLOSURE WOUND 1/2 X4 (GAUZE/BANDAGES/DRESSINGS) ×1
CLSR STERI-STRIP ANTIMIC 1/2X4 (GAUZE/BANDAGES/DRESSINGS) ×2 IMPLANT
DECANTER SPIKE VIAL GLASS SM (MISCELLANEOUS) ×6 IMPLANT
ELECT REM PT RETURN 9FT ADLT (ELECTROSURGICAL) ×3
ELECTRODE REM PT RTRN 9FT ADLT (ELECTROSURGICAL) ×1 IMPLANT
GAUZE SPONGE 4X4 12PLY STRL (GAUZE/BANDAGES/DRESSINGS) ×3 IMPLANT
GEL ULTRASOUND 20GR AQUASONIC (MISCELLANEOUS) IMPLANT
GLOVE BIO SURGEON STRL SZ 6.5 (GLOVE) ×4 IMPLANT
GLOVE BIO SURGEONS STRL SZ 6.5 (GLOVE) ×2
GLOVE BIOGEL PI IND STRL 6.5 (GLOVE) ×2 IMPLANT
GLOVE BIOGEL PI INDICATOR 6.5 (GLOVE) ×4
GLOVE ECLIPSE 7.5 STRL STRAW (GLOVE) ×3 IMPLANT
GLOVE SS BIOGEL STRL SZ 7.5 (GLOVE) ×1 IMPLANT
GLOVE SUPERSENSE BIOGEL SZ 7.5 (GLOVE) ×2
GOWN STRL REUS W/ TWL LRG LVL3 (GOWN DISPOSABLE) ×3 IMPLANT
GOWN STRL REUS W/ TWL XL LVL3 (GOWN DISPOSABLE) ×1 IMPLANT
GOWN STRL REUS W/TWL LRG LVL3 (GOWN DISPOSABLE) ×6
GOWN STRL REUS W/TWL XL LVL3 (GOWN DISPOSABLE) ×2
KIT BASIN OR (CUSTOM PROCEDURE TRAY) ×3 IMPLANT
KIT ROOM TURNOVER OR (KITS) ×3 IMPLANT
NS IRRIG 1000ML POUR BTL (IV SOLUTION) ×3 IMPLANT
PACK CV ACCESS (CUSTOM PROCEDURE TRAY) ×3 IMPLANT
PAD ARMBOARD 7.5X6 YLW CONV (MISCELLANEOUS) ×6 IMPLANT
STAPLER VISISTAT 35W (STAPLE) IMPLANT
STRIP CLOSURE SKIN 1/2X4 (GAUZE/BANDAGES/DRESSINGS) ×2 IMPLANT
SUT ETHILON 3 0 PS 1 (SUTURE) IMPLANT
SUT PROLENE 6 0 CC (SUTURE) IMPLANT
SUT SILK 0 TIES 10X30 (SUTURE) ×3 IMPLANT
SUT VIC AB 3-0 SH 27 (SUTURE) ×2
SUT VIC AB 3-0 SH 27X BRD (SUTURE) ×1 IMPLANT
TAPE CLOTH SURG 4X10 WHT LF (GAUZE/BANDAGES/DRESSINGS) ×3 IMPLANT
UNDERPAD 30X30 INCONTINENT (UNDERPADS AND DIAPERS) ×3 IMPLANT
WATER STERILE IRR 1000ML POUR (IV SOLUTION) ×3 IMPLANT

## 2015-07-16 NOTE — H&P (View-Only) (Signed)
Patient is a 59 year old male who presents for follow-up today for placement of a left radiocephalic AV fistula on [id number]. He denies any numbness or tingling in his hand. He currently is dialyzing via a right-sided catheter. Status Tuesday Thursday Saturday.  Physical exam:  Filed Vitals:   06/19/15 1012 06/19/15 1014  BP: 142/82 142/78  Pulse: 84   Temp: 98.1 F (36.7 C)   TempSrc: Oral   Height: 5\' 7"  (1.702 m)   Weight: 198 lb 8 oz (90.039 kg)   SpO2: 100%     Left upper extremity: Palpable thrill in fistula fistula is palpable over the proximal third and the distal third. The midforearm portion is a little bit more difficult to palpate.  Data: Patient had a duplex ultrasound of his fistula today. Depth was less than 3 mm throughout its course. Overall the fistula is about 5 mm in diameter. There was possible narrowing in the distal forearm.  Assessment: A 5 mm left radiocephalic AV fistula. There may be some narrowing in the proximal vein.  Plan: Fistulogram possible intervention scheduled for 07/04/2015. The patient had several appointments prior to this so he wished to defer until then.  Risks benefits possible palpitations and procedure details of fistulogram possible angioplasty were discussed with the patient today. These included but were not limited to bleeding infection loss of fistula.  Ruta Hinds, MD [facility] [place] Office: [phone] Pager: [phone]

## 2015-07-16 NOTE — Anesthesia Preprocedure Evaluation (Signed)
Anesthesia Evaluation  Patient identified by MRN, date of birth, ID band Patient awake    Reviewed: Allergy & Precautions, NPO status , Patient's Chart, lab work & pertinent test results  History of Anesthesia Complications Negative for: history of anesthetic complications  Airway Mallampati: II  TM Distance: >3 FB Neck ROM: Full    Dental  (+) Edentulous Upper, Edentulous Lower   Pulmonary former smoker,  breath sounds clear to auscultation        Cardiovascular hypertension, Pt. on medications and Pt. on home beta blockers - angina+CHF - Past MI - dysrhythmias Rhythm:Regular     Neuro/Psych negative neurological ROS  negative psych ROS   GI/Hepatic negative GI ROS, Neg liver ROS,   Endo/Other  diabetes, Type 2, Insulin Dependent  Renal/GU Dialysis and Renal InsufficiencyRenal disease     Musculoskeletal   Abdominal   Peds  Hematology  (+) anemia ,   Anesthesia Other Findings Left ventricle: Decrease thickening of the inferolateral segments and the base inferior segment. EF is 45-50%. The cavity size was normal. Wall thickness was increased in a pattern of moderate to severe LVH. Findings consistent with left ventricular diastolic dysfunction. - Left atrium: The atrium was mildly dilated. - Right ventricle: The cavity size was normal. Systolic function was normal. - Pericardium, extracardiac: A trivial pericardial effusion was identified posterior to the heart.    Reproductive/Obstetrics                             Anesthesia Physical Anesthesia Plan  ASA: III  Anesthesia Plan: MAC   Post-op Pain Management:    Induction: Intravenous  Airway Management Planned: Nasal Cannula  Additional Equipment: None  Intra-op Plan:   Post-operative Plan:   Informed Consent: I have reviewed the patients History and Physical, chart, labs and discussed the procedure  including the risks, benefits and alternatives for the proposed anesthesia with the patient or authorized representative who has indicated his/her understanding and acceptance.     Plan Discussed with: CRNA and Surgeon  Anesthesia Plan Comments:         Anesthesia Quick Evaluation

## 2015-07-16 NOTE — Discharge Instructions (Signed)
07/16/2015 Dean Lubbers [id number] Aug 24, 1956  Surgeon(s): Larina Earthly, MD  Procedure(s): LIGATION OF SIDE BRANCHES OF ARTERIOVENOUS FISTULA LEFT ARM  x Do not stick graft for 4 weeks

## 2015-07-16 NOTE — Transfer of Care (Signed)
Immediate Anesthesia Transfer of Care Note  Patient: James [name]  Procedure(s) Performed: Procedure(s): LIGATION OF SIDE BRANCHES OF ARTERIOVENOUS FISTULA LEFT ARM (Left)  Patient Location: PACU  Anesthesia Type:MAC  Level of Consciousness: awake, alert  and oriented  Airway & Oxygen Therapy: Patient Spontanous Breathing  Post-op Assessment: Report given to RN and Post -op Vital signs reviewed and stable  Post vital signs: Reviewed and stable  Last Vitals:  Filed Vitals:   07/16/15 0933  BP:   Pulse:   Temp: 36.7 C  Resp:     Complications: No apparent anesthesia complications

## 2015-07-16 NOTE — Op Note (Signed)
OPERATIVE REPORT  DATE OF SURGERY: 07/16/2015  PATIENT: James [name], 59 y.o. male MRN: [id number]  DOB: 1956/02/25  PRE-OPERATIVE DIAGNOSIS: End-stage renal disease with poorly functioning left radiocephalic AV fistula  POST-OPERATIVE DIAGNOSIS:  Same  PROCEDURE: Ligation of multiple competing branches of left radiocephalic AV fistula  SURGEON:  Gretta Began, M.D.  PHYSICIAN ASSISTANT: Samantha Rhyne PA-C  ANESTHESIA:  Local with sedation  EBL: Minimal ml     BLOOD ADMINISTERED: None  DRAINS: None  SPECIMEN: None  COUNTS CORRECT:  YES  PLAN OF CARE: PACU   PATIENT DISPOSITION:  PACU - hemodynamically stable  PROCEDURE DETAILS: The patient was taken to the operating placed supine dysrhythmia the left arm prepped draped sterile fashion. Preoperative shuntogram was reviewed and the SonoSite was used to identify several competing branches. There was one branch that was near the radiocephalic anastomosis. On occluding this there was marked increased flow through the fistula. Using local anesthesia a small incision was made over this branch. The branch was isolated and was ligated with 3-0 silk tie. Next the mid forearm there were 2 separate branches side by side came off the fistula as well. Was made parallel to the fistula. Likely the remaining portion as listed in veins were imaged branches were noted. Wounds irrigated with saline. Hemostasis tablet cautery. The wounds were closed with 30 Vicryls in the subcutaneous and subcuticular tissue. Sterile dressing was applied and the patient was transferred to the recovery room   Gretta Began, M.D. 07/16/2015 9:39 AM

## 2015-07-16 NOTE — Anesthesia Postprocedure Evaluation (Signed)
  Anesthesia Post-op Note  Patient: James [name]  Procedure(s) Performed: Procedure(s): LIGATION OF SIDE BRANCHES OF ARTERIOVENOUS FISTULA LEFT ARM (Left)  Patient Location: PACU  Anesthesia Type:MAC  Level of Consciousness: awake  Airway and Oxygen Therapy: Patient Spontanous Breathing  Post-op Pain: none  Post-op Assessment: Post-op Vital signs reviewed, Patient's Cardiovascular Status Stable, Respiratory Function Stable, Patent Airway, No signs of Nausea or vomiting and Pain level controlled              Post-op Vital Signs: Reviewed and stable  Last Vitals:  Filed Vitals:   07/16/15 1007  BP: 111/67  Pulse: 72  Temp: 36.7 C  Resp: 16    Complications: No apparent anesthesia complications

## 2015-07-16 NOTE — Interval H&P Note (Signed)
History and Physical Interval Note:  07/16/2015 8:21 AM  James [name]  has presented today for surgery, with the diagnosis of End Stage Renal Disease N18.6  The various methods of treatment have been discussed with the patient and family. After consideration of risks, benefits and other options for treatment, the patient has consented to  Procedure(s): LIGATION OF SIDE BRANCHES OF ARTERIOVENOUS FISTULA (Left) as a surgical intervention .  The patient's history has been reviewed, patient examined, no change in status, stable for surgery.  I have reviewed the patient's chart and labs.  Questions were answered to the patient's satisfaction.     Curt Jews

## 2015-07-17 ENCOUNTER — Telehealth: Payer: Self-pay | Admitting: Vascular Surgery

## 2015-07-17 ENCOUNTER — Encounter (HOSPITAL_COMMUNITY): Payer: Self-pay | Admitting: Vascular Surgery

## 2015-07-17 DIAGNOSIS — E118 Type 2 diabetes mellitus with unspecified complications: Secondary | ICD-10-CM | POA: Diagnosis not present

## 2015-07-17 DIAGNOSIS — N2581 Secondary hyperparathyroidism of renal origin: Secondary | ICD-10-CM | POA: Diagnosis not present

## 2015-07-17 DIAGNOSIS — D631 Anemia in chronic kidney disease: Secondary | ICD-10-CM | POA: Diagnosis not present

## 2015-07-17 DIAGNOSIS — N186 End stage renal disease: Secondary | ICD-10-CM | POA: Diagnosis not present

## 2015-07-17 DIAGNOSIS — D509 Iron deficiency anemia, unspecified: Secondary | ICD-10-CM | POA: Diagnosis not present

## 2015-07-17 NOTE — Telephone Encounter (Signed)
-----   Message from Mena Goes, RN sent at 07/16/2015 10:05 AM EDT ----- Regarding: schedule See part of message re: follow up   ----- Message -----    From: Rosetta Posner, MD    Sent: 07/16/2015   9:42 AM      To: [facility] Charge Pool  Ligation of competing branches left AV fistula. Assistant Leontine Locket. Should be able to use this in one month. Does not need hospital office follow

## 2015-07-19 DIAGNOSIS — D631 Anemia in chronic kidney disease: Secondary | ICD-10-CM | POA: Diagnosis not present

## 2015-07-19 DIAGNOSIS — D509 Iron deficiency anemia, unspecified: Secondary | ICD-10-CM | POA: Diagnosis not present

## 2015-07-19 DIAGNOSIS — N2581 Secondary hyperparathyroidism of renal origin: Secondary | ICD-10-CM | POA: Diagnosis not present

## 2015-07-19 DIAGNOSIS — N186 End stage renal disease: Secondary | ICD-10-CM | POA: Diagnosis not present

## 2015-07-19 DIAGNOSIS — E118 Type 2 diabetes mellitus with unspecified complications: Secondary | ICD-10-CM | POA: Diagnosis not present

## 2015-07-22 DIAGNOSIS — D631 Anemia in chronic kidney disease: Secondary | ICD-10-CM | POA: Diagnosis not present

## 2015-07-22 DIAGNOSIS — E118 Type 2 diabetes mellitus with unspecified complications: Secondary | ICD-10-CM | POA: Diagnosis not present

## 2015-07-22 DIAGNOSIS — N186 End stage renal disease: Secondary | ICD-10-CM | POA: Diagnosis not present

## 2015-07-22 DIAGNOSIS — D509 Iron deficiency anemia, unspecified: Secondary | ICD-10-CM | POA: Diagnosis not present

## 2015-07-22 DIAGNOSIS — N2581 Secondary hyperparathyroidism of renal origin: Secondary | ICD-10-CM | POA: Diagnosis not present

## 2015-07-24 DIAGNOSIS — D631 Anemia in chronic kidney disease: Secondary | ICD-10-CM | POA: Diagnosis not present

## 2015-07-24 DIAGNOSIS — E118 Type 2 diabetes mellitus with unspecified complications: Secondary | ICD-10-CM | POA: Diagnosis not present

## 2015-07-24 DIAGNOSIS — D509 Iron deficiency anemia, unspecified: Secondary | ICD-10-CM | POA: Diagnosis not present

## 2015-07-24 DIAGNOSIS — N186 End stage renal disease: Secondary | ICD-10-CM | POA: Diagnosis not present

## 2015-07-24 DIAGNOSIS — N2581 Secondary hyperparathyroidism of renal origin: Secondary | ICD-10-CM | POA: Diagnosis not present

## 2015-07-26 DIAGNOSIS — N2581 Secondary hyperparathyroidism of renal origin: Secondary | ICD-10-CM | POA: Diagnosis not present

## 2015-07-26 DIAGNOSIS — D509 Iron deficiency anemia, unspecified: Secondary | ICD-10-CM | POA: Diagnosis not present

## 2015-07-26 DIAGNOSIS — E118 Type 2 diabetes mellitus with unspecified complications: Secondary | ICD-10-CM | POA: Diagnosis not present

## 2015-07-26 DIAGNOSIS — N186 End stage renal disease: Secondary | ICD-10-CM | POA: Diagnosis not present

## 2015-07-26 DIAGNOSIS — D631 Anemia in chronic kidney disease: Secondary | ICD-10-CM | POA: Diagnosis not present

## 2015-07-28 ENCOUNTER — Ambulatory Visit (HOSPITAL_COMMUNITY): Payer: Medicare Other | Admitting: Hematology & Oncology

## 2015-07-29 DIAGNOSIS — D509 Iron deficiency anemia, unspecified: Secondary | ICD-10-CM | POA: Diagnosis not present

## 2015-07-29 DIAGNOSIS — D631 Anemia in chronic kidney disease: Secondary | ICD-10-CM | POA: Diagnosis not present

## 2015-07-29 DIAGNOSIS — N2581 Secondary hyperparathyroidism of renal origin: Secondary | ICD-10-CM | POA: Diagnosis not present

## 2015-07-29 DIAGNOSIS — E118 Type 2 diabetes mellitus with unspecified complications: Secondary | ICD-10-CM | POA: Diagnosis not present

## 2015-07-29 DIAGNOSIS — N186 End stage renal disease: Secondary | ICD-10-CM | POA: Diagnosis not present

## 2015-07-31 DIAGNOSIS — N2581 Secondary hyperparathyroidism of renal origin: Secondary | ICD-10-CM | POA: Diagnosis not present

## 2015-07-31 DIAGNOSIS — D631 Anemia in chronic kidney disease: Secondary | ICD-10-CM | POA: Diagnosis not present

## 2015-07-31 DIAGNOSIS — N186 End stage renal disease: Secondary | ICD-10-CM | POA: Diagnosis not present

## 2015-07-31 DIAGNOSIS — E118 Type 2 diabetes mellitus with unspecified complications: Secondary | ICD-10-CM | POA: Diagnosis not present

## 2015-07-31 DIAGNOSIS — D509 Iron deficiency anemia, unspecified: Secondary | ICD-10-CM | POA: Diagnosis not present

## 2015-08-01 ENCOUNTER — Telehealth: Payer: Self-pay | Admitting: *Deleted

## 2015-08-01 NOTE — Telephone Encounter (Signed)
Per Dr. Luther Parody staff message in Dana's telephone encounter on 07-17-15; patient does not need any followup and the kidney center may access this left arm AVF in 1 month from 07-16-15 branch ligation. Patient was informed and voiced understanding and agreement of this plan.

## 2015-08-01 NOTE — Telephone Encounter (Signed)
-----   Message from Gena Fray sent at 08/01/2015  4:18 PM EDT ----- Regarding: Using access Contact: [phone] Please call Herberth at [phone]. He has questions about using his access. He has not followed up since his surgery.   Thank you! Dana  PS- I will be out from 08/29 to 09/01, so please respond to the [facility]

## 2015-08-02 DIAGNOSIS — D509 Iron deficiency anemia, unspecified: Secondary | ICD-10-CM | POA: Diagnosis not present

## 2015-08-02 DIAGNOSIS — E118 Type 2 diabetes mellitus with unspecified complications: Secondary | ICD-10-CM | POA: Diagnosis not present

## 2015-08-02 DIAGNOSIS — D631 Anemia in chronic kidney disease: Secondary | ICD-10-CM | POA: Diagnosis not present

## 2015-08-02 DIAGNOSIS — N186 End stage renal disease: Secondary | ICD-10-CM | POA: Diagnosis not present

## 2015-08-02 DIAGNOSIS — N2581 Secondary hyperparathyroidism of renal origin: Secondary | ICD-10-CM | POA: Diagnosis not present

## 2015-08-05 DIAGNOSIS — D631 Anemia in chronic kidney disease: Secondary | ICD-10-CM | POA: Diagnosis not present

## 2015-08-05 DIAGNOSIS — N2581 Secondary hyperparathyroidism of renal origin: Secondary | ICD-10-CM | POA: Diagnosis not present

## 2015-08-05 DIAGNOSIS — D509 Iron deficiency anemia, unspecified: Secondary | ICD-10-CM | POA: Diagnosis not present

## 2015-08-05 DIAGNOSIS — E118 Type 2 diabetes mellitus with unspecified complications: Secondary | ICD-10-CM | POA: Diagnosis not present

## 2015-08-05 DIAGNOSIS — N186 End stage renal disease: Secondary | ICD-10-CM | POA: Diagnosis not present

## 2015-08-06 DIAGNOSIS — Z992 Dependence on renal dialysis: Secondary | ICD-10-CM | POA: Diagnosis not present

## 2015-08-06 DIAGNOSIS — N041 Nephrotic syndrome with focal and segmental glomerular lesions: Secondary | ICD-10-CM | POA: Diagnosis not present

## 2015-08-06 DIAGNOSIS — N186 End stage renal disease: Secondary | ICD-10-CM | POA: Diagnosis not present

## 2015-08-07 DIAGNOSIS — N186 End stage renal disease: Secondary | ICD-10-CM | POA: Diagnosis not present

## 2015-08-07 DIAGNOSIS — E118 Type 2 diabetes mellitus with unspecified complications: Secondary | ICD-10-CM | POA: Diagnosis not present

## 2015-08-07 DIAGNOSIS — D509 Iron deficiency anemia, unspecified: Secondary | ICD-10-CM | POA: Diagnosis not present

## 2015-08-07 DIAGNOSIS — Z23 Encounter for immunization: Secondary | ICD-10-CM | POA: Diagnosis not present

## 2015-08-09 DIAGNOSIS — Z23 Encounter for immunization: Secondary | ICD-10-CM | POA: Diagnosis not present

## 2015-08-09 DIAGNOSIS — E118 Type 2 diabetes mellitus with unspecified complications: Secondary | ICD-10-CM | POA: Diagnosis not present

## 2015-08-09 DIAGNOSIS — N186 End stage renal disease: Secondary | ICD-10-CM | POA: Diagnosis not present

## 2015-08-09 DIAGNOSIS — D509 Iron deficiency anemia, unspecified: Secondary | ICD-10-CM | POA: Diagnosis not present

## 2015-08-12 DIAGNOSIS — Z23 Encounter for immunization: Secondary | ICD-10-CM | POA: Diagnosis not present

## 2015-08-12 DIAGNOSIS — D509 Iron deficiency anemia, unspecified: Secondary | ICD-10-CM | POA: Diagnosis not present

## 2015-08-12 DIAGNOSIS — E118 Type 2 diabetes mellitus with unspecified complications: Secondary | ICD-10-CM | POA: Diagnosis not present

## 2015-08-12 DIAGNOSIS — N186 End stage renal disease: Secondary | ICD-10-CM | POA: Diagnosis not present

## 2015-08-14 DIAGNOSIS — Z23 Encounter for immunization: Secondary | ICD-10-CM | POA: Diagnosis not present

## 2015-08-14 DIAGNOSIS — E118 Type 2 diabetes mellitus with unspecified complications: Secondary | ICD-10-CM | POA: Diagnosis not present

## 2015-08-14 DIAGNOSIS — D509 Iron deficiency anemia, unspecified: Secondary | ICD-10-CM | POA: Diagnosis not present

## 2015-08-14 DIAGNOSIS — N186 End stage renal disease: Secondary | ICD-10-CM | POA: Diagnosis not present

## 2015-08-16 DIAGNOSIS — D509 Iron deficiency anemia, unspecified: Secondary | ICD-10-CM | POA: Diagnosis not present

## 2015-08-16 DIAGNOSIS — N186 End stage renal disease: Secondary | ICD-10-CM | POA: Diagnosis not present

## 2015-08-16 DIAGNOSIS — Z23 Encounter for immunization: Secondary | ICD-10-CM | POA: Diagnosis not present

## 2015-08-16 DIAGNOSIS — E118 Type 2 diabetes mellitus with unspecified complications: Secondary | ICD-10-CM | POA: Diagnosis not present

## 2015-08-18 ENCOUNTER — Ambulatory Visit (HOSPITAL_COMMUNITY): Payer: Medicare Other | Admitting: Hematology & Oncology

## 2015-08-18 NOTE — Progress Notes (Signed)
This encounter was created in error - please disregard.

## 2015-08-19 DIAGNOSIS — Z23 Encounter for immunization: Secondary | ICD-10-CM | POA: Diagnosis not present

## 2015-08-19 DIAGNOSIS — N186 End stage renal disease: Secondary | ICD-10-CM | POA: Diagnosis not present

## 2015-08-19 DIAGNOSIS — E118 Type 2 diabetes mellitus with unspecified complications: Secondary | ICD-10-CM | POA: Diagnosis not present

## 2015-08-19 DIAGNOSIS — D509 Iron deficiency anemia, unspecified: Secondary | ICD-10-CM | POA: Diagnosis not present

## 2015-08-21 DIAGNOSIS — Z23 Encounter for immunization: Secondary | ICD-10-CM | POA: Diagnosis not present

## 2015-08-21 DIAGNOSIS — E118 Type 2 diabetes mellitus with unspecified complications: Secondary | ICD-10-CM | POA: Diagnosis not present

## 2015-08-21 DIAGNOSIS — D509 Iron deficiency anemia, unspecified: Secondary | ICD-10-CM | POA: Diagnosis not present

## 2015-08-21 DIAGNOSIS — N186 End stage renal disease: Secondary | ICD-10-CM | POA: Diagnosis not present

## 2015-08-21 NOTE — Progress Notes (Signed)
This encounter was created in error - please disregard.

## 2015-08-23 DIAGNOSIS — Z23 Encounter for immunization: Secondary | ICD-10-CM | POA: Diagnosis not present

## 2015-08-23 DIAGNOSIS — D509 Iron deficiency anemia, unspecified: Secondary | ICD-10-CM | POA: Diagnosis not present

## 2015-08-23 DIAGNOSIS — E118 Type 2 diabetes mellitus with unspecified complications: Secondary | ICD-10-CM | POA: Diagnosis not present

## 2015-08-23 DIAGNOSIS — N186 End stage renal disease: Secondary | ICD-10-CM | POA: Diagnosis not present

## 2015-08-26 DIAGNOSIS — E118 Type 2 diabetes mellitus with unspecified complications: Secondary | ICD-10-CM | POA: Diagnosis not present

## 2015-08-26 DIAGNOSIS — D509 Iron deficiency anemia, unspecified: Secondary | ICD-10-CM | POA: Diagnosis not present

## 2015-08-26 DIAGNOSIS — Z23 Encounter for immunization: Secondary | ICD-10-CM | POA: Diagnosis not present

## 2015-08-26 DIAGNOSIS — N186 End stage renal disease: Secondary | ICD-10-CM | POA: Diagnosis not present

## 2015-08-28 DIAGNOSIS — N186 End stage renal disease: Secondary | ICD-10-CM | POA: Diagnosis not present

## 2015-08-28 DIAGNOSIS — D509 Iron deficiency anemia, unspecified: Secondary | ICD-10-CM | POA: Diagnosis not present

## 2015-08-28 DIAGNOSIS — Z23 Encounter for immunization: Secondary | ICD-10-CM | POA: Diagnosis not present

## 2015-08-28 DIAGNOSIS — E118 Type 2 diabetes mellitus with unspecified complications: Secondary | ICD-10-CM | POA: Diagnosis not present

## 2015-08-30 DIAGNOSIS — Z23 Encounter for immunization: Secondary | ICD-10-CM | POA: Diagnosis not present

## 2015-08-30 DIAGNOSIS — D509 Iron deficiency anemia, unspecified: Secondary | ICD-10-CM | POA: Diagnosis not present

## 2015-08-30 DIAGNOSIS — N186 End stage renal disease: Secondary | ICD-10-CM | POA: Diagnosis not present

## 2015-08-30 DIAGNOSIS — E118 Type 2 diabetes mellitus with unspecified complications: Secondary | ICD-10-CM | POA: Diagnosis not present

## 2015-09-01 ENCOUNTER — Encounter (HOSPITAL_COMMUNITY): Payer: Self-pay

## 2015-09-02 DIAGNOSIS — E118 Type 2 diabetes mellitus with unspecified complications: Secondary | ICD-10-CM | POA: Diagnosis not present

## 2015-09-02 DIAGNOSIS — Z23 Encounter for immunization: Secondary | ICD-10-CM | POA: Diagnosis not present

## 2015-09-02 DIAGNOSIS — N186 End stage renal disease: Secondary | ICD-10-CM | POA: Diagnosis not present

## 2015-09-02 DIAGNOSIS — D509 Iron deficiency anemia, unspecified: Secondary | ICD-10-CM | POA: Diagnosis not present

## 2015-09-04 ENCOUNTER — Other Ambulatory Visit: Payer: Self-pay

## 2015-09-04 DIAGNOSIS — E118 Type 2 diabetes mellitus with unspecified complications: Secondary | ICD-10-CM | POA: Diagnosis not present

## 2015-09-04 DIAGNOSIS — Z23 Encounter for immunization: Secondary | ICD-10-CM | POA: Diagnosis not present

## 2015-09-04 DIAGNOSIS — N186 End stage renal disease: Secondary | ICD-10-CM | POA: Diagnosis not present

## 2015-09-04 DIAGNOSIS — D509 Iron deficiency anemia, unspecified: Secondary | ICD-10-CM | POA: Diagnosis not present

## 2015-09-05 DIAGNOSIS — Z992 Dependence on renal dialysis: Secondary | ICD-10-CM | POA: Diagnosis not present

## 2015-09-05 DIAGNOSIS — N041 Nephrotic syndrome with focal and segmental glomerular lesions: Secondary | ICD-10-CM | POA: Diagnosis not present

## 2015-09-05 DIAGNOSIS — N186 End stage renal disease: Secondary | ICD-10-CM | POA: Diagnosis not present

## 2015-09-06 DIAGNOSIS — E118 Type 2 diabetes mellitus with unspecified complications: Secondary | ICD-10-CM | POA: Diagnosis not present

## 2015-09-06 DIAGNOSIS — D509 Iron deficiency anemia, unspecified: Secondary | ICD-10-CM | POA: Diagnosis not present

## 2015-09-06 DIAGNOSIS — N186 End stage renal disease: Secondary | ICD-10-CM | POA: Diagnosis not present

## 2015-09-08 ENCOUNTER — Encounter (HOSPITAL_COMMUNITY)
Admission: RE | Disposition: A | Discharge status: Home / Self Care | Payer: Self-pay | Source: Ambulatory Visit | Attending: Vascular Surgery

## 2015-09-08 ENCOUNTER — Ambulatory Visit (HOSPITAL_COMMUNITY)
Admission: RE | Admit: 2015-09-08 | Discharge: 2015-09-08 | Disposition: A | Discharge status: Home / Self Care | Payer: Medicare Other | Source: Ambulatory Visit | Attending: Vascular Surgery | Admitting: Vascular Surgery

## 2015-09-08 DIAGNOSIS — I509 Heart failure, unspecified: Secondary | ICD-10-CM | POA: Diagnosis not present

## 2015-09-08 DIAGNOSIS — Z87891 Personal history of nicotine dependence: Secondary | ICD-10-CM | POA: Insufficient documentation

## 2015-09-08 DIAGNOSIS — Z794 Long term (current) use of insulin: Secondary | ICD-10-CM | POA: Diagnosis not present

## 2015-09-08 DIAGNOSIS — I130 Hypertensive heart and chronic kidney disease with heart failure and stage 1 through stage 4 chronic kidney disease, or unspecified chronic kidney disease: Secondary | ICD-10-CM | POA: Insufficient documentation

## 2015-09-08 DIAGNOSIS — Y832 Surgical operation with anastomosis, bypass or graft as the cause of abnormal reaction of the patient, or of later complication, without mention of misadventure at the time of the procedure: Secondary | ICD-10-CM | POA: Diagnosis not present

## 2015-09-08 DIAGNOSIS — Z992 Dependence on renal dialysis: Secondary | ICD-10-CM | POA: Diagnosis not present

## 2015-09-08 DIAGNOSIS — N186 End stage renal disease: Secondary | ICD-10-CM | POA: Diagnosis not present

## 2015-09-08 DIAGNOSIS — T82898A Other specified complication of vascular prosthetic devices, implants and grafts, initial encounter: Secondary | ICD-10-CM | POA: Diagnosis not present

## 2015-09-08 DIAGNOSIS — E1122 Type 2 diabetes mellitus with diabetic chronic kidney disease: Secondary | ICD-10-CM | POA: Insufficient documentation

## 2015-09-08 HISTORY — PX: PERIPHERAL VASCULAR CATHETERIZATION: SHX172C

## 2015-09-08 LAB — POCT I-STAT, CHEM 8
BUN: 54 mg/dL — AB (ref 6–20)
CREATININE: 9 mg/dL — AB (ref 0.61–1.24)
Calcium, Ion: 1.15 mmol/L (ref 1.12–1.23)
Chloride: 94 mmol/L — ABNORMAL LOW (ref 101–111)
GLUCOSE: 204 mg/dL — AB (ref 65–99)
HCT: 33 % — ABNORMAL LOW (ref 39.0–52.0)
HEMOGLOBIN: 11.2 g/dL — AB (ref 13.0–17.0)
Potassium: 4.2 mmol/L (ref 3.5–5.1)
Sodium: 132 mmol/L — ABNORMAL LOW (ref 135–145)
TCO2: 26 mmol/L (ref 0–100)

## 2015-09-08 LAB — GLUCOSE, CAPILLARY: Glucose-Capillary: 171 mg/dL — ABNORMAL HIGH (ref 65–99)

## 2015-09-08 SURGERY — A/V SHUNTOGRAM/FISTULAGRAM

## 2015-09-08 MED ORDER — SODIUM CHLORIDE 0.9 % IJ SOLN: 3.0000 mL | Freq: Two times a day (BID) | INTRAMUSCULAR | Status: DC

## 2015-09-08 MED ORDER — IODIXANOL 320 MG/ML IV SOLN
INTRAVENOUS | Status: DC | PRN
  Administered 2015-09-08: 65 mL via INTRAVENOUS

## 2015-09-08 MED ORDER — SODIUM CHLORIDE 0.9 % IJ SOLN: 3.0000 mL | INTRAMUSCULAR | Status: DC | PRN

## 2015-09-08 MED ORDER — ACETAMINOPHEN 325 MG PO TABS: 650.0000 mg | ORAL_TABLET | ORAL | Status: DC | PRN

## 2015-09-08 MED ORDER — LIDOCAINE HCL (PF) 1 % IJ SOLN
INTRAMUSCULAR | Status: DC | PRN
  Administered 2015-09-08: 14:00:00

## 2015-09-08 MED ORDER — SODIUM CHLORIDE 0.9 % IV SOLN: 250.0000 mL | INTRAVENOUS | Status: DC | PRN

## 2015-09-08 MED ORDER — HEPARIN (PORCINE) IN NACL 2-0.9 UNIT/ML-% IJ SOLN
INTRAMUSCULAR | Status: AC
  Filled 2015-09-08: qty 1000

## 2015-09-08 MED ORDER — LIDOCAINE HCL (PF) 1 % IJ SOLN
INTRAMUSCULAR | Status: AC
  Filled 2015-09-08: qty 30

## 2015-09-08 SURGICAL SUPPLY — 12 items
BAG SNAP BAND KOVER 36X36 (MISCELLANEOUS) ×3 IMPLANT
COVER DOME SNAP 22 D (MISCELLANEOUS) ×3 IMPLANT
COVER PRB 48X5XTLSCP FOLD TPE (BAG) ×1 IMPLANT
COVER PROBE 5X48 (BAG) ×2
KIT MICROINTRODUCER STIFF 5F (SHEATH) ×3 IMPLANT
PROTECTION STATION PRESSURIZED (MISCELLANEOUS) ×3
STATION PROTECTION PRESSURIZED (MISCELLANEOUS) ×1 IMPLANT
STOPCOCK MORSE 400PSI 3WAY (MISCELLANEOUS) ×3 IMPLANT
TRAY PV CATH (CUSTOM PROCEDURE TRAY) ×3 IMPLANT
TUBING CIL FLEX 10 FLL-RA (TUBING) ×3 IMPLANT
WIRE BENTSON .035X145CM (WIRE) ×3 IMPLANT
WIRE TORQFLEX AUST .018X40CM (WIRE) ×6 IMPLANT

## 2015-09-08 NOTE — H&P (Signed)
Brief History and Physical  History of Present Illness  James [name] is a 59 y.o. male who presents with chief complaint: poorly functioning L RC AVF.  The patient presents today for L arm fistulogram to evaluate his L RC AVF which has recently undergone side branch ligation..    Past Medical History  Diagnosis Date  . Hypertension   . CHF (congestive heart failure)   . Diabetes mellitus without complication     Type II  . Chronic kidney disease     TTHS    Past Surgical History  Procedure Laterality Date  . Colonoscopy    . Av fistula placement Left 04/08/2015    Procedure: LEFT RADIOCEPHALIC ARTERIOVENOUS (AV) FISTULA CREATION;  Surgeon: Elam Dutch, MD;  Location: [place];  Service: Vascular;  Laterality: Left;  . Peripheral vascular catheterization N/A 07/04/2015    Procedure: Fistulagram;  Surgeon: Elam Dutch, MD;  Location: [place] [facility];  Service: Cardiovascular;  Laterality: N/A;  . Ligation of competing branches of arteriovenous fistula Left 07/16/2015    Procedure: LIGATION OF SIDE BRANCHES OF ARTERIOVENOUS FISTULA LEFT ARM;  Surgeon: Rosetta Posner, MD;  Location: [facility] OR;  Service: Vascular;  Laterality: Left;    Social History   Social History  . Marital Status: Single    Spouse Name: N/A  . Number of Children: N/A  . Years of Education: N/A   Occupational History  . Not on file.   Social History Main Topics  . Smoking status: Former Smoker -- 1 years  . Smokeless tobacco: Not on file  . Alcohol Use: No  . Drug Use: No  . Sexual Activity: Not on file   Other Topics Concern  . Not on file   Social History Narrative    No family history on file.  No current facility-administered medications on file prior to encounter.   Current Outpatient Prescriptions on File Prior to Encounter  Medication Sig Dispense Refill  . calcium acetate (PHOSLO) 667 MG capsule Take 2 capsules (1,334 mg total) by mouth 3 (three) times daily with meals. 90  capsule 0  . Darbepoetin Alfa (ARANESP) 100 MCG/0.5ML SOSY injection Inject 0.5 mLs (100 mcg total) into the vein every Thursday with hemodialysis. Will be given at dialysis.    Marland Kitchen gabapentin (NEURONTIN) 300 MG capsule Take 1 capsule (300 mg total) by mouth at bedtime. 30 capsule 0  . hydrALAZINE (APRESOLINE) 25 MG tablet Take 1 tablet (25 mg total) by mouth 2 (two) times daily. (Patient taking differently: Take 12.5 mg by mouth 2 (two) times daily. ) 60 tablet 0  . insulin detemir (LEVEMIR) 100 UNIT/ML injection Inject 20 Units into the skin 2 (two) times daily.    . isosorbide mononitrate (IMDUR) 60 MG 24 hr tablet Take 1 tablet (60 mg total) by mouth daily. 30 tablet 0  . Nutritional Supplements (FEEDING SUPPLEMENT, NEPRO CARB STEADY,) LIQD Take 237 mLs by mouth 3 (three) times a week.       No Known Allergies  Review of Systems: As listed above, otherwise negative.  Physical Examination  Filed Vitals:   09/08/15 0950  BP: 158/79  Pulse: 96  Temp: 98.2 F (36.8 C)  TempSrc: Oral  Resp: 18  Height: 5\' 5"  (1.651 m)  Weight: 210 lb (95.255 kg)  SpO2: 100%    General: A&O x 3, WDWN  Pulmonary: Sym exp, good air movt, CTAB, no rales, rhonchi, & wheezing  Cardiac: RRR, Nl S1, S2,  no Murmurs, rubs or gallops  Gastrointestinal: soft, NTND, -G/R, - HSM, - masses, - CVAT B  Musculoskeletal: M/S 5/5 throughout , Extremities without ischemic changes , L RC AVF with bruit and thrill  Laboratory See iStat   Medical Decision Making [name] is a 59 y.o. male who presents with: poorly maturing L RC AVF on ESRD-HD   The patient is scheduled for: L arm fistulogram. I discussed with the patient the nature of angiographic procedures, especially the limited patencies of any endovascular intervention.  The patient is aware of that the risks of an angiographic procedure include but are not limited to: bleeding, infection, access site complications, renal failure, embolization, rupture  of vessel, dissection, possible need for emergent surgical intervention, possible need for surgical procedures to treat the patient's pathology, and stroke and death.    The patient is aware of the risks and agrees to proceed.  Adele Barthel, MD [facility] [place] Office: [phone] Pager: [phone]  09/08/2015, 12:10 PM

## 2015-09-08 NOTE — Discharge Instructions (Signed)
Fistulogram, Care After °Refer to this sheet in the next few weeks. These instructions provide you with information on caring for yourself after your procedure. Your health care provider may also give you more specific instructions. Your treatment has been planned according to current medical practices, but problems sometimes occur. Call your health care provider if you have any problems or questions after your procedure. °WHAT TO EXPECT AFTER THE PROCEDURE °After your procedure, it is typical to have the following: °· A small amount of discomfort in the area where the catheters were placed. °· A small amount of bruising around the fistula. °· Sleepiness and fatigue. °HOME CARE INSTRUCTIONS °· Rest at home for the day following your procedure. °· Do not drive or operate heavy machinery while taking pain medicine. °· Take medicines only as directed by your health care provider. °· Do not take baths, swim, or use a hot tub until your health care provider approves. You may shower 24 hours after the procedure or as directed by your health care provider. °· There are many different ways to close and cover an incision, including stitches, skin glue, and adhesive strips. Follow your health care provider's instructions on: °¨ Incision care. °¨ Bandage (dressing) changes and removal. °¨ Incision closure removal. °· Monitor your dialysis fistula carefully. °SEEK MEDICAL CARE IF: °· You have drainage, redness, swelling, or pain at your catheter site. °· You have a fever. °· You have chills. °SEEK IMMEDIATE MEDICAL CARE IF: °· You feel weak. °· You have trouble balancing. °· You have trouble moving your arms or legs. °· You have problems with your speech or vision. °· You can no longer feel a vibration or buzz when you put your fingers over your dialysis fistula. °· The limb that was used for the procedure: °¨ Swells. °¨ Is painful. °¨ Is cold. °¨ Is discolored, such as blue or pale white. °Document Released: 04/08/2014  Document Reviewed: 01/11/2014 °ExitCare® Patient Information ©2015 [employer]. This information is not intended to replace advice given to you by your health care provider. Make sure you discuss any questions you have with your health care provider. ° °

## 2015-09-09 ENCOUNTER — Encounter (HOSPITAL_COMMUNITY): Payer: Self-pay | Admitting: Vascular Surgery

## 2015-09-09 DIAGNOSIS — D509 Iron deficiency anemia, unspecified: Secondary | ICD-10-CM | POA: Diagnosis not present

## 2015-09-09 DIAGNOSIS — E118 Type 2 diabetes mellitus with unspecified complications: Secondary | ICD-10-CM | POA: Diagnosis not present

## 2015-09-09 DIAGNOSIS — N186 End stage renal disease: Secondary | ICD-10-CM | POA: Diagnosis not present

## 2015-09-10 ENCOUNTER — Other Ambulatory Visit: Payer: Self-pay

## 2015-09-11 ENCOUNTER — Encounter (HOSPITAL_COMMUNITY): Payer: Self-pay | Admitting: *Deleted

## 2015-09-11 DIAGNOSIS — E118 Type 2 diabetes mellitus with unspecified complications: Secondary | ICD-10-CM | POA: Diagnosis not present

## 2015-09-11 DIAGNOSIS — D509 Iron deficiency anemia, unspecified: Secondary | ICD-10-CM | POA: Diagnosis not present

## 2015-09-11 DIAGNOSIS — N186 End stage renal disease: Secondary | ICD-10-CM | POA: Diagnosis not present

## 2015-09-11 NOTE — Progress Notes (Signed)
Anesthesia Chart Review: SAME DAY WORK-UP.  Patient is a 59 year old male scheduled for ligation of left radiocephalic AVF and creation of right radiocephalic versus brachiocephalic AVF on [id number] by Dr. Bridgett Larsson. Case is posted for MAC anesthesia. He is s/p creation of left radiocephalic AVF on 0000000 with ligation of side branches on 07/16/15, but fistulogram revealed it was still malfunctioning.  History includes ESRD due to collapsing FSGS (HD TTS, [place]), former smoker, DM2, HTN, CHF, "smoldering myeloma." He was admitted 03/22/15 - 04/12/15 for CHF with acute/subacute renal disease/injury. He was seen by cardiology at that time, Dr. Sherren Mocha, for acute on chronic systolic and diastolic CHF improving with diuretics. At that time no further invasive cardiac evaluation was recommended because of his advanced renal disease and risk of further injury. Cardiology signed off on 03/31/15, but patient ultimately started hemodialysis on 04/05/15. BMI is consistent with obesity. PCP has been with the [facility]. Nephrologist is with [facility].   HEM-ONC is Dr. Ancil Linsey with [facility]. He is following patient for "smoldering myeloma" and anemia. According to his 06/27/15 note, "It is felt that his end-stage renal disease is due to collapsing FSGS. He did have a renal biopsy in April that showed collapsing FSGS that is felt to be explanatory of his renal course. I have discussed his case with Dr. Melba Coon at [facility] who does not feel that the patient's M protein is contributing to his renal disease. Therefore we will continue to hold off on calling him 'active myeloma' and/or treating him at this time. His anemia can most certainly be related to his end-stage renal disease and dialysis."  Meds include Phoslo, Aranesp, Neurontin, hydralazine, Levemir, Imdur.  03/22/15 EKG: NSR, anterior infarct (age undetermined).  03/23/15 Echo: Study Conclusions - Left ventricle:  Decrease thickening of the inferolateral segments and the base inferior segment. EF is 45-50%. The cavity size was normal. Wall thickness was increased in a pattern of moderate to severe LVH. Findings consistent with left ventricular diastolic dysfunction. - Left atrium: The atrium was mildly dilated. - Right ventricle: The cavity size was normal. Systolic function was normal. - Pericardium, extracardiac: A trivial pericardial effusion was identified posterior to the heart. - Impressions: Large pleural effusion.  03/22/15 CXR: FINDINGS: There is mild cardiomegaly and vascular pedicle widening. Diffuse interstitial opacity with Kerley lines. Small bilateral pleural effusions with atelectasis. IMPRESSION: CHF.  He is for labs on arrival.   Patient has tolerated two previous HD access procedures since his 03/2015 admission. If no acute changes, labs are acceptable and no acute CHF symptoms then I would anticipate that he could proceed as planned. Discussed with Dr. Jenita Seashore as well.  George Hugh [facility] Short Stay Center/Anesthesiology Phone [phone] 09/11/2015 4:27 PM

## 2015-09-11 NOTE — Progress Notes (Signed)
Pt denies SOB, chest pain, and being under the care of a cardiologist. Pt denies having a stress test and cardiac cath. Pt stated that he was instructed to take half of his night dose of insulin the night before procedure and none the morning of procedure . Pt verbalized understanding of all pre-op instructions.

## 2015-09-13 DIAGNOSIS — N186 End stage renal disease: Secondary | ICD-10-CM | POA: Diagnosis not present

## 2015-09-13 DIAGNOSIS — E118 Type 2 diabetes mellitus with unspecified complications: Secondary | ICD-10-CM | POA: Diagnosis not present

## 2015-09-13 DIAGNOSIS — D509 Iron deficiency anemia, unspecified: Secondary | ICD-10-CM | POA: Diagnosis not present

## 2015-09-15 ENCOUNTER — Ambulatory Visit (HOSPITAL_COMMUNITY): Payer: Medicare Other | Admitting: Vascular Surgery

## 2015-09-15 ENCOUNTER — Encounter (HOSPITAL_COMMUNITY): Payer: Self-pay

## 2015-09-15 ENCOUNTER — Ambulatory Visit (HOSPITAL_COMMUNITY): Payer: Medicare Other

## 2015-09-15 ENCOUNTER — Ambulatory Visit (HOSPITAL_COMMUNITY)
Admission: RE | Admit: 2015-09-15 | Discharge: 2015-09-15 | Disposition: A | Discharge status: Home / Self Care | Payer: Medicare Other | Source: Ambulatory Visit | Attending: Vascular Surgery | Admitting: Vascular Surgery

## 2015-09-15 ENCOUNTER — Encounter (HOSPITAL_COMMUNITY)
Admission: RE | Disposition: A | Discharge status: Home / Self Care | Payer: Self-pay | Source: Ambulatory Visit | Attending: Vascular Surgery

## 2015-09-15 DIAGNOSIS — Y832 Surgical operation with anastomosis, bypass or graft as the cause of abnormal reaction of the patient, or of later complication, without mention of misadventure at the time of the procedure: Secondary | ICD-10-CM | POA: Diagnosis not present

## 2015-09-15 DIAGNOSIS — E1122 Type 2 diabetes mellitus with diabetic chronic kidney disease: Secondary | ICD-10-CM | POA: Insufficient documentation

## 2015-09-15 DIAGNOSIS — I132 Hypertensive heart and chronic kidney disease with heart failure and with stage 5 chronic kidney disease, or end stage renal disease: Secondary | ICD-10-CM | POA: Diagnosis not present

## 2015-09-15 DIAGNOSIS — Z87891 Personal history of nicotine dependence: Secondary | ICD-10-CM | POA: Insufficient documentation

## 2015-09-15 DIAGNOSIS — I5042 Chronic combined systolic (congestive) and diastolic (congestive) heart failure: Secondary | ICD-10-CM | POA: Diagnosis not present

## 2015-09-15 DIAGNOSIS — N186 End stage renal disease: Secondary | ICD-10-CM | POA: Diagnosis not present

## 2015-09-15 DIAGNOSIS — I509 Heart failure, unspecified: Secondary | ICD-10-CM | POA: Diagnosis not present

## 2015-09-15 DIAGNOSIS — C9000 Multiple myeloma not having achieved remission: Secondary | ICD-10-CM | POA: Insufficient documentation

## 2015-09-15 DIAGNOSIS — Z79899 Other long term (current) drug therapy: Secondary | ICD-10-CM | POA: Insufficient documentation

## 2015-09-15 DIAGNOSIS — T82590A Other mechanical complication of surgically created arteriovenous fistula, initial encounter: Secondary | ICD-10-CM | POA: Insufficient documentation

## 2015-09-15 DIAGNOSIS — N185 Chronic kidney disease, stage 5: Secondary | ICD-10-CM | POA: Diagnosis not present

## 2015-09-15 DIAGNOSIS — N179 Acute kidney failure, unspecified: Secondary | ICD-10-CM | POA: Diagnosis not present

## 2015-09-15 DIAGNOSIS — E669 Obesity, unspecified: Secondary | ICD-10-CM | POA: Insufficient documentation

## 2015-09-15 DIAGNOSIS — Z992 Dependence on renal dialysis: Secondary | ICD-10-CM | POA: Diagnosis not present

## 2015-09-15 DIAGNOSIS — Z01818 Encounter for other preprocedural examination: Secondary | ICD-10-CM

## 2015-09-15 HISTORY — PX: LIGATION OF ARTERIOVENOUS  FISTULA: SHX5948

## 2015-09-15 HISTORY — PX: AV FISTULA PLACEMENT: SHX1204

## 2015-09-15 HISTORY — DX: Unspecified asthma, uncomplicated: J45.909

## 2015-09-15 LAB — POCT I-STAT 4, (NA,K, GLUC, HGB,HCT)
Glucose, Bld: 213 mg/dL — ABNORMAL HIGH (ref 65–99)
HEMATOCRIT: 30 % — AB (ref 39.0–52.0)
HEMOGLOBIN: 10.2 g/dL — AB (ref 13.0–17.0)
Potassium: 4 mmol/L (ref 3.5–5.1)
SODIUM: 133 mmol/L — AB (ref 135–145)

## 2015-09-15 LAB — GLUCOSE, CAPILLARY
GLUCOSE-CAPILLARY: 193 mg/dL — AB (ref 65–99)
Glucose-Capillary: 222 mg/dL — ABNORMAL HIGH (ref 65–99)

## 2015-09-15 SURGERY — LIGATION OF ARTERIOVENOUS  FISTULA
Anesthesia: General | Site: Arm Upper | Laterality: Right

## 2015-09-15 MED ORDER — EPHEDRINE SULFATE 50 MG/ML IJ SOLN
INTRAMUSCULAR | Status: AC
  Filled 2015-09-15: qty 1

## 2015-09-15 MED ORDER — HEPARIN SODIUM (PORCINE) 1000 UNIT/ML IJ SOLN
2000.0000 [IU] | Freq: Once | INTRAMUSCULAR | Status: AC
  Administered 2015-09-15: 2000 [IU] via INTRAVENOUS
  Filled 2015-09-15: qty 2

## 2015-09-15 MED ORDER — SODIUM CHLORIDE 0.9 % IJ SOLN
INTRAMUSCULAR | Status: AC
  Filled 2015-09-15: qty 10

## 2015-09-15 MED ORDER — ONDANSETRON HCL 4 MG/2ML IJ SOLN
INTRAMUSCULAR | Status: DC | PRN
  Administered 2015-09-15: 4 mg via INTRAVENOUS

## 2015-09-15 MED ORDER — ACETAMINOPHEN 160 MG/5ML PO SOLN
325.0000 mg | ORAL | Status: DC | PRN
  Filled 2015-09-15: qty 20.3

## 2015-09-15 MED ORDER — OXYCODONE HCL 5 MG PO TABS: 5.0000 mg | ORAL_TABLET | Freq: Once | ORAL | Status: DC | PRN

## 2015-09-15 MED ORDER — PROMETHAZINE HCL 25 MG/ML IJ SOLN: 6.2500 mg | INTRAMUSCULAR | Status: DC | PRN

## 2015-09-15 MED ORDER — PHENYLEPHRINE HCL 10 MG/ML IJ SOLN
10.0000 mg | INTRAVENOUS | Status: DC | PRN
  Administered 2015-09-15: 10 ug/min via INTRAVENOUS

## 2015-09-15 MED ORDER — 0.9 % SODIUM CHLORIDE (POUR BTL) OPTIME
TOPICAL | Status: DC | PRN
  Administered 2015-09-15: 1000 mL

## 2015-09-15 MED ORDER — FENTANYL CITRATE (PF) 100 MCG/2ML IJ SOLN
INTRAMUSCULAR | Status: AC
  Filled 2015-09-15: qty 2

## 2015-09-15 MED ORDER — OXYCODONE HCL 5 MG/5ML PO SOLN: 5.0000 mg | Freq: Once | ORAL | Status: DC | PRN

## 2015-09-15 MED ORDER — LIDOCAINE HCL (CARDIAC) 20 MG/ML IV SOLN
INTRAVENOUS | Status: DC | PRN
  Administered 2015-09-15: 60 mg via INTRAVENOUS

## 2015-09-15 MED ORDER — PHENYLEPHRINE 40 MCG/ML (10ML) SYRINGE FOR IV PUSH (FOR BLOOD PRESSURE SUPPORT)
PREFILLED_SYRINGE | INTRAVENOUS | Status: AC
  Filled 2015-09-15: qty 10

## 2015-09-15 MED ORDER — PROPOFOL 10 MG/ML IV BOLUS
INTRAVENOUS | Status: AC
  Filled 2015-09-15: qty 20

## 2015-09-15 MED ORDER — SODIUM CHLORIDE 0.9 % IV SOLN
INTRAVENOUS | Status: DC
  Administered 2015-09-15 (×2): via INTRAVENOUS

## 2015-09-15 MED ORDER — MIDAZOLAM HCL 2 MG/2ML IJ SOLN
INTRAMUSCULAR | Status: AC
  Filled 2015-09-15: qty 4

## 2015-09-15 MED ORDER — CEFUROXIME SODIUM 1.5 G IJ SOLR
1.5000 g | INTRAMUSCULAR | Status: AC
  Administered 2015-09-15: 1.5 g via INTRAVENOUS

## 2015-09-15 MED ORDER — SUCCINYLCHOLINE CHLORIDE 20 MG/ML IJ SOLN
INTRAMUSCULAR | Status: AC
  Filled 2015-09-15: qty 1

## 2015-09-15 MED ORDER — PROPOFOL 10 MG/ML IV BOLUS
INTRAVENOUS | Status: DC | PRN
  Administered 2015-09-15: 170 mg via INTRAVENOUS

## 2015-09-15 MED ORDER — CHLORHEXIDINE GLUCONATE CLOTH 2 % EX PADS: 6.0000 | MEDICATED_PAD | Freq: Once | CUTANEOUS | Status: DC

## 2015-09-15 MED ORDER — HEPARIN SODIUM (PORCINE) 5000 UNIT/ML IJ SOLN
INTRAMUSCULAR | Status: DC | PRN
  Administered 2015-09-15: 08:00:00

## 2015-09-15 MED ORDER — ONDANSETRON HCL 4 MG/2ML IJ SOLN
INTRAMUSCULAR | Status: AC
  Filled 2015-09-15: qty 2

## 2015-09-15 MED ORDER — FENTANYL CITRATE (PF) 100 MCG/2ML IJ SOLN
25.0000 ug | INTRAMUSCULAR | Status: DC | PRN
  Administered 2015-09-15 (×2): 25 ug via INTRAVENOUS

## 2015-09-15 MED ORDER — MIDAZOLAM HCL 5 MG/5ML IJ SOLN
INTRAMUSCULAR | Status: DC | PRN
  Administered 2015-09-15: 1 mg via INTRAVENOUS

## 2015-09-15 MED ORDER — LIDOCAINE HCL (PF) 1 % IJ SOLN
INTRAMUSCULAR | Status: DC | PRN
  Administered 2015-09-15: 30 mL

## 2015-09-15 MED ORDER — DEXTROSE 5 % IV SOLN
INTRAVENOUS | Status: AC
  Filled 2015-09-15: qty 1.5

## 2015-09-15 MED ORDER — ROCURONIUM BROMIDE 50 MG/5ML IV SOLN
INTRAVENOUS | Status: AC
  Filled 2015-09-15: qty 1

## 2015-09-15 MED ORDER — FENTANYL CITRATE (PF) 100 MCG/2ML IJ SOLN
INTRAMUSCULAR | Status: DC | PRN
  Administered 2015-09-15: 75 ug via INTRAVENOUS
  Administered 2015-09-15: 25 ug via INTRAVENOUS

## 2015-09-15 MED ORDER — FENTANYL CITRATE (PF) 250 MCG/5ML IJ SOLN
INTRAMUSCULAR | Status: AC
  Filled 2015-09-15: qty 5

## 2015-09-15 MED ORDER — HEPARIN 1000 UNIT/ML FOR PERITONEAL DIALYSIS
2000.0000 [IU] | INTRAMUSCULAR | Status: DC | PRN
  Filled 2015-09-15: qty 2

## 2015-09-15 MED ORDER — LIDOCAINE HCL (PF) 1 % IJ SOLN
INTRAMUSCULAR | Status: AC
  Filled 2015-09-15: qty 30

## 2015-09-15 MED ORDER — ACETAMINOPHEN 325 MG PO TABS: 325.0000 mg | ORAL_TABLET | ORAL | Status: DC | PRN

## 2015-09-15 MED ORDER — LIDOCAINE HCL (CARDIAC) 20 MG/ML IV SOLN
INTRAVENOUS | Status: AC
  Filled 2015-09-15: qty 5

## 2015-09-15 MED ORDER — HYDROMORPHONE HCL 1 MG/ML IJ SOLN: 0.2500 mg | INTRAMUSCULAR | Status: DC | PRN

## 2015-09-15 MED ORDER — OXYCODONE HCL 5 MG PO TABS
5.0000 mg | ORAL_TABLET | Freq: Four times a day (QID) | ORAL | Status: DC | PRN
Start: 2015-09-15 — End: 2016-03-10

## 2015-09-15 SURGICAL SUPPLY — 42 items
ARMBAND PINK RESTRICT EXTREMIT (MISCELLANEOUS) ×8 IMPLANT
CANISTER SUCTION 2500CC (MISCELLANEOUS) ×4 IMPLANT
CLIP TI MEDIUM 6 (CLIP) ×4 IMPLANT
CLIP TI WIDE RED SMALL 6 (CLIP) ×4 IMPLANT
COVER PROBE W GEL 5X96 (DRAPES) ×4 IMPLANT
DECANTER SPIKE VIAL GLASS SM (MISCELLANEOUS) ×4 IMPLANT
DRAPE ORTHO SPLIT 77X108 STRL (DRAPES) ×2
DRAPE PROXIMA HALF (DRAPES) ×4 IMPLANT
DRAPE SURG ORHT 6 SPLT 77X108 (DRAPES) ×2 IMPLANT
ELECT REM PT RETURN 9FT ADLT (ELECTROSURGICAL) ×4
ELECTRODE REM PT RTRN 9FT ADLT (ELECTROSURGICAL) ×2 IMPLANT
GAUZE SPONGE 4X4 12PLY STRL (GAUZE/BANDAGES/DRESSINGS) ×4 IMPLANT
GLOVE BIO SURGEON STRL SZ 6.5 (GLOVE) ×6 IMPLANT
GLOVE BIO SURGEON STRL SZ7 (GLOVE) ×4 IMPLANT
GLOVE BIO SURGEONS STRL SZ 6.5 (GLOVE) ×2
GLOVE BIOGEL PI IND STRL 6.5 (GLOVE) ×4 IMPLANT
GLOVE BIOGEL PI IND STRL 7.0 (GLOVE) ×4 IMPLANT
GLOVE BIOGEL PI IND STRL 7.5 (GLOVE) ×2 IMPLANT
GLOVE BIOGEL PI INDICATOR 6.5 (GLOVE) ×4
GLOVE BIOGEL PI INDICATOR 7.0 (GLOVE) ×4
GLOVE BIOGEL PI INDICATOR 7.5 (GLOVE) ×2
GLOVE ECLIPSE 6.5 STRL STRAW (GLOVE) ×4 IMPLANT
GOWN STRL REUS W/ TWL LRG LVL3 (GOWN DISPOSABLE) ×6 IMPLANT
GOWN STRL REUS W/TWL LRG LVL3 (GOWN DISPOSABLE) ×6
KIT BASIN OR (CUSTOM PROCEDURE TRAY) ×4 IMPLANT
KIT ROOM TURNOVER OR (KITS) ×4 IMPLANT
LIQUID BAND (GAUZE/BANDAGES/DRESSINGS) ×8 IMPLANT
NS IRRIG 1000ML POUR BTL (IV SOLUTION) ×4 IMPLANT
PACK CV ACCESS (CUSTOM PROCEDURE TRAY) ×4 IMPLANT
PAD ARMBOARD 7.5X6 YLW CONV (MISCELLANEOUS) ×8 IMPLANT
SPONGE SURGIFOAM ABS GEL 100 (HEMOSTASIS) IMPLANT
STOCKINETTE 6  STRL (DRAPES) ×2
STOCKINETTE 6 STRL (DRAPES) ×2 IMPLANT
SUT ETHILON 3 0 PS 1 (SUTURE) IMPLANT
SUT MNCRL AB 4-0 PS2 18 (SUTURE) ×8 IMPLANT
SUT PROLENE 6 0 BV (SUTURE) ×4 IMPLANT
SUT PROLENE 7 0 BV 1 (SUTURE) IMPLANT
SUT SILK 0 TIES 10X30 (SUTURE) ×4 IMPLANT
SUT VIC AB 3-0 SH 27 (SUTURE) ×4
SUT VIC AB 3-0 SH 27X BRD (SUTURE) ×4 IMPLANT
UNDERPAD 30X30 INCONTINENT (UNDERPADS AND DIAPERS) ×4 IMPLANT
WATER STERILE IRR 1000ML POUR (IV SOLUTION) ×4 IMPLANT

## 2015-09-15 NOTE — H&P (View-Only) (Signed)
Brief History and Physical  History of Present Illness  James [name] is a 59 y.o. male who presents with chief complaint: poorly functioning L RC AVF.  The patient presents today for L arm fistulogram to evaluate his L RC AVF which has recently undergone side branch ligation..    Past Medical History  Diagnosis Date  . Hypertension   . CHF (congestive heart failure)   . Diabetes mellitus without complication     Type II  . Chronic kidney disease     TTHS    Past Surgical History  Procedure Laterality Date  . Colonoscopy    . Av fistula placement Left 04/08/2015    Procedure: LEFT RADIOCEPHALIC ARTERIOVENOUS (AV) FISTULA CREATION;  Surgeon: Elam Dutch, MD;  Location: [place];  Service: Vascular;  Laterality: Left;  . Peripheral vascular catheterization N/A 07/04/2015    Procedure: Fistulagram;  Surgeon: Elam Dutch, MD;  Location: [place] [facility];  Service: Cardiovascular;  Laterality: N/A;  . Ligation of competing branches of arteriovenous fistula Left 07/16/2015    Procedure: LIGATION OF SIDE BRANCHES OF ARTERIOVENOUS FISTULA LEFT ARM;  Surgeon: Rosetta Posner, MD;  Location: [facility] OR;  Service: Vascular;  Laterality: Left;    Social History   Social History  . Marital Status: Single    Spouse Name: N/A  . Number of Children: N/A  . Years of Education: N/A   Occupational History  . Not on file.   Social History Main Topics  . Smoking status: Former Smoker -- 1 years  . Smokeless tobacco: Not on file  . Alcohol Use: No  . Drug Use: No  . Sexual Activity: Not on file   Other Topics Concern  . Not on file   Social History Narrative    No family history on file.  No current facility-administered medications on file prior to encounter.   Current Outpatient Prescriptions on File Prior to Encounter  Medication Sig Dispense Refill  . calcium acetate (PHOSLO) 667 MG capsule Take 2 capsules (1,334 mg total) by mouth 3 (three) times daily with meals. 90  capsule 0  . Darbepoetin Alfa (ARANESP) 100 MCG/0.5ML SOSY injection Inject 0.5 mLs (100 mcg total) into the vein every Thursday with hemodialysis. Will be given at dialysis.    Marland Kitchen gabapentin (NEURONTIN) 300 MG capsule Take 1 capsule (300 mg total) by mouth at bedtime. 30 capsule 0  . hydrALAZINE (APRESOLINE) 25 MG tablet Take 1 tablet (25 mg total) by mouth 2 (two) times daily. (Patient taking differently: Take 12.5 mg by mouth 2 (two) times daily. ) 60 tablet 0  . insulin detemir (LEVEMIR) 100 UNIT/ML injection Inject 20 Units into the skin 2 (two) times daily.    . isosorbide mononitrate (IMDUR) 60 MG 24 hr tablet Take 1 tablet (60 mg total) by mouth daily. 30 tablet 0  . Nutritional Supplements (FEEDING SUPPLEMENT, NEPRO CARB STEADY,) LIQD Take 237 mLs by mouth 3 (three) times a week.       No Known Allergies  Review of Systems: As listed above, otherwise negative.  Physical Examination  Filed Vitals:   09/08/15 0950  BP: 158/79  Pulse: 96  Temp: 98.2 F (36.8 C)  TempSrc: Oral  Resp: 18  Height: 5\' 5"  (1.651 m)  Weight: 210 lb (95.255 kg)  SpO2: 100%    General: A&O x 3, WDWN  Pulmonary: Sym exp, good air movt, CTAB, no rales, rhonchi, & wheezing  Cardiac: RRR, Nl S1, S2,  no Murmurs, rubs or gallops  Gastrointestinal: soft, NTND, -G/R, - HSM, - masses, - CVAT B  Musculoskeletal: M/S 5/5 throughout , Extremities without ischemic changes , L RC AVF with bruit and thrill  Laboratory See iStat   Medical Decision Making James [name] is a 59 y.o. male who presents with: poorly maturing L RC AVF on ESRD-HD   The patient is scheduled for: L arm fistulogram. I discussed with the patient the nature of angiographic procedures, especially the limited patencies of any endovascular intervention.  The patient is aware of that the risks of an angiographic procedure include but are not limited to: bleeding, infection, access site complications, renal failure, embolization, rupture  of vessel, dissection, possible need for emergent surgical intervention, possible need for surgical procedures to treat the patient's pathology, and stroke and death.    The patient is aware of the risks and agrees to proceed.  Adele Barthel, MD [facility] [place] Office: [phone] Pager: [phone]  09/08/2015, 12:10 PM

## 2015-09-15 NOTE — Op Note (Signed)
OPERATIVE NOTE   PROCEDURE: 1.  Left radiocephalic arteriovenous fistula ligation 2.  Right brachiocephalic arteriovenous fistula placement  PRE-OPERATIVE DIAGNOSIS: end stage renal disease   POST-OPERATIVE DIAGNOSIS: same as above   SURGEON: Adele Barthel, MD  ASSISTANT(S): Leontine Locket, PAC   ANESTHESIA: general  ESTIMATED BLOOD LOSS: 50 cc  FINDING(S): 1.  No further thrill in left radiocephalic arteriovenous fistula at end of case 2.  Minimal pulse in brachial artery 3.  Minimal disease in brachial artery upon arteriotomy 4.  Palpable thrill at end of case with faintly palpable radial pulse  SPECIMEN(S):  none  INDICATIONS:   James [name] is a 59 y.o. male who presents with end stage renal disease and left radiocephalic arteriovenous fistula that has failed to mature.  I recently shot a left arm fistulogram which demonstrated axillary vein stenosis vs thrombosis with recannulation and central venous aberrancy.  I had concerns that placing a left radiocephalic vs. brachiocephalic arteriovenous fistula after ligation the radiocephalic fistula would result in left arm venous stenosis, so I recommended: right brachiocephalic arteriovenous fistula placement.  The patient is aware the risks include but are not limited to: bleeding, infection, steal syndrome, nerve damage, ischemic monomelic neuropathy, failure to mature, and need for additional procedures.  The patient is aware of the risks of the procedure and elects to proceed forward.  DESCRIPTION: After full informed written consent was obtained from the patient, the patient was brought back to the operating room and placed supine upon the operating table.  Prior to induction, the patient received IV antibiotics.   After obtaining adequate anesthesia, the patient was then prepped and draped in the standard fashion for a right arm access procedure.  I could not feel a strong brachial artery much less a radial artery pulse.   Under Sonosite neither artery appeared to be pulsatile.  I turned my attention first to identifying the patient's cephalic vein and brachial artery.  Using SonoSite guidance, the location of these vessels were marked out on the skin.   At this point, I injected local anesthetic to obtain a field block of the antecubitum.  In total, I injected about 10 mL of a 1% lidocaine without epinephrine.  I made a transverse incision at the level of the antecubitum and dissected through the subcutaneous tissue and fascia to gain exposure of the brachial artery.  This was noted to be 3.5 mm in diameter externally.  I could not feel a pulse in this artery.  This was dissected out proximally and distally and controlled with vessel loops .  I then dissected out the cephalic vein.  This was noted to be 4-5 mm in diameter externally.  I ligated the branches connecting the cephalic and cubital vein, thus preserving a cubital vein to the basilic vein system.  The distal segment of the vein was ligated with a  2-0 silk, and the vein was transected.  The proximal segment was interrogated with serial dilators.  The vein accepted up to a 5 mm dilator without any difficulty.  I then instilled the heparinized saline into the vein and clamped it.  At this point, I reset my exposure of the brachial artery and placed the artery under tension proximally and distally.  I made an arteriotomy with a #11 blade, and then I extended the arteriotomy with a Potts scissor.  I injected heparinized saline proximal and distal to this arteriotomy.  The vein was then sewn to the artery in an end-to-side configuration with  a running stitch of 6-0 Prolene.  Prior to completing this anastomosis, I allowed the vein and artery to backbleed.  There was no evidence of clot from any vessels.  I completed the anastomosis in the usual fashion and then released all vessel loops and clamps.  There was a pulse initially in the fistula.  I sharply lysed some the adjacent  biciptal tendon compressing the fistula.  This resulted in a palpable thrill in the venous outflow, and there was a faintly palpable radial pulse.  At this point, I irrigated out the surgical wound.  There was no further active bleeding.  The subcutaneous tissue was reapproximated with a running stitch of 3-0 Vicryl.  The skin was then reapproximated with a running subcuticular stitch of 4-0 Vicryl.  The skin was then cleaned, dried, and reinforced with Dermabond.  The patient tolerated this procedure well.    COMPLICATIONS: none  CONDITION: stable   Adele Barthel, MD [facility] [place] Office: [phone] Pager: [phone]  09/15/2015, 8:55 AM

## 2015-09-15 NOTE — Anesthesia Procedure Notes (Signed)
Procedure Name: LMA Insertion Date/Time: 09/15/2015 7:40 AM Performed by: Izora Gala Pre-anesthesia Checklist: Patient identified, Emergency Drugs available, Suction available and Patient being monitored Patient Re-evaluated:Patient Re-evaluated prior to inductionOxygen Delivery Method: Circle system utilized Preoxygenation: Pre-oxygenation with 100% oxygen Intubation Type: IV induction Ventilation: Mask ventilation without difficulty LMA: LMA inserted and LMA with gastric port inserted LMA Size: 4.0 Number of attempts: 1 Placement Confirmation: positive ETCO2 and breath sounds checked- equal and bilateral Tube secured with: Tape Dental Injury: Teeth and Oropharynx as per pre-operative assessment

## 2015-09-15 NOTE — Interval H&P Note (Signed)
History and Physical Interval Note:  09/15/2015 7:26 AM  James [name]  has presented today for surgery, with the diagnosis of End Stage Renal Disease N18.6  The various methods of treatment have been discussed with the patient and family. After consideration of risks, benefits and other options for treatment, the patient has consented to  Procedure(s): LIGATION OF RADIOCEPHALIC ARTERIOVENOUS FISTULA (Left) RADIOCEPHALIC VERSUS BRACHIOCEPHALIC ARTERIOVENOUS (AV) FISTULA CREATION (Right) as a surgical intervention .  The patient's history has been reviewed, patient examined, no change in status, stable for surgery.  I have reviewed the patient's chart and labs.  Questions were answered to the patient's satisfaction.     Adele Barthel

## 2015-09-15 NOTE — Anesthesia Preprocedure Evaluation (Addendum)
Anesthesia Evaluation  Patient identified by MRN, date of birth, ID band Patient awake    Reviewed: Allergy & Precautions, NPO status , Patient's Chart, lab work & pertinent test results  History of Anesthesia Complications Negative for: history of anesthetic complications  Airway Mallampati: II       Dental  (+) Edentulous Upper, Edentulous Lower   Pulmonary former smoker,    Pulmonary exam normal breath sounds clear to auscultation       Cardiovascular hypertension, Pt. on medications +CHF   Rhythm:Regular     Neuro/Psych negative neurological ROS  negative psych ROS   GI/Hepatic   Endo/Other  diabetes, Type 2  Renal/GU ESRF and DialysisRenal disease     Musculoskeletal   Abdominal (+) + obese,  Abdomen: soft.    Peds  Hematology   Anesthesia Other Findings   Reproductive/Obstetrics                            Anesthesia Physical Anesthesia Plan  ASA: IV  Anesthesia Plan: General   Post-op Pain Management:    Induction: Intravenous  Airway Management Planned: LMA  Additional Equipment:   Intra-op Plan:   Post-operative Plan:   Informed Consent:   Dental advisory given and History available from chart only  Plan Discussed with: CRNA and Anesthesiologist  Anesthesia Plan Comments:        Anesthesia Quick Evaluation

## 2015-09-15 NOTE — Discharge Instructions (Signed)
09/15/2015 James [name] [id number] 12-25-1955  Surgeon(s): Fransisco Hertz, MD  Procedure(s): LIGATION OF RADIOCEPHALIC ARTERIOVENOUS FISTULA - LEFT ARM RADIOCEPHALIC VERSUS BRACHIOCEPHALIC ARTERIOVENOUS (AV) FISTULA CREATION - RIGHT ARM  x Do not stick fistula for 12 weeks  \

## 2015-09-15 NOTE — Transfer of Care (Signed)
Immediate Anesthesia Transfer of Care Note  Patient: James [name]  Procedure(s) Performed: Procedure(s): LIGATION OF RADIOCEPHALIC ARTERIOVENOUS FISTULA - LEFT ARM (Left) RADIOCEPHALIC VERSUS BRACHIOCEPHALIC ARTERIOVENOUS (AV) FISTULA CREATION - RIGHT ARM (Right)  Patient Location: PACU  Anesthesia Type:General  Level of Consciousness: awake, alert , oriented and patient cooperative  Airway & Oxygen Therapy: Patient Spontanous Breathing and Patient connected to face mask oxygen  Post-op Assessment: Report given to RN, Post -op Vital signs reviewed and stable, Patient moving all extremities and Patient moving all extremities X 4  Post vital signs: Reviewed and stable  Last Vitals:  Filed Vitals:   09/15/15 0616  BP: 154/85  Pulse: 84  Temp: 37 C  Resp: 18    Complications: No apparent anesthesia complications

## 2015-09-16 ENCOUNTER — Telehealth: Payer: Self-pay | Admitting: Vascular Surgery

## 2015-09-16 ENCOUNTER — Encounter (HOSPITAL_COMMUNITY): Payer: Self-pay | Admitting: Vascular Surgery

## 2015-09-16 DIAGNOSIS — D509 Iron deficiency anemia, unspecified: Secondary | ICD-10-CM | POA: Diagnosis not present

## 2015-09-16 DIAGNOSIS — N186 End stage renal disease: Secondary | ICD-10-CM | POA: Diagnosis not present

## 2015-09-16 DIAGNOSIS — E118 Type 2 diabetes mellitus with unspecified complications: Secondary | ICD-10-CM | POA: Diagnosis not present

## 2015-09-16 NOTE — Anesthesia Postprocedure Evaluation (Signed)
  Anesthesia Post-op Note  Patient: James [name]  Procedure(s) Performed: Procedure(s): LIGATION OF RADIOCEPHALIC ARTERIOVENOUS FISTULA - LEFT ARM (Left) RADIOCEPHALIC VERSUS BRACHIOCEPHALIC ARTERIOVENOUS (AV) FISTULA CREATION - RIGHT ARM (Right)  Patient Location: PACU  Anesthesia Type:General  Level of Consciousness: awake  Airway and Oxygen Therapy: Patient Spontanous Breathing  Post-op Pain: mild  Post-op Assessment: Post-op Vital signs reviewed, Patient's Cardiovascular Status Stable, Respiratory Function Stable, Patent Airway, No signs of Nausea or vomiting and Pain level controlled              Post-op Vital Signs: Reviewed and stable  Last Vitals:  Filed Vitals:   09/15/15 1114  BP: 171/63  Pulse: 80  Temp:   Resp:     Complications: No apparent anesthesia complications

## 2015-09-16 NOTE — Telephone Encounter (Signed)
LM for pt re appts, dpm

## 2015-09-16 NOTE — Telephone Encounter (Signed)
-----   Message from Gabriel Earing, [place] sent at 09/15/2015  9:11 AM EDT ----- S/p ligation left radial cephalic AVF and creation of right brachiocephalic AVF [id number].  F/u with Dr. Bridgett Larsson in 4 weeks.  Thanks, Aldona Bar

## 2015-09-18 DIAGNOSIS — D509 Iron deficiency anemia, unspecified: Secondary | ICD-10-CM | POA: Diagnosis not present

## 2015-09-18 DIAGNOSIS — E118 Type 2 diabetes mellitus with unspecified complications: Secondary | ICD-10-CM | POA: Diagnosis not present

## 2015-09-18 DIAGNOSIS — N186 End stage renal disease: Secondary | ICD-10-CM | POA: Diagnosis not present

## 2015-09-20 DIAGNOSIS — E118 Type 2 diabetes mellitus with unspecified complications: Secondary | ICD-10-CM | POA: Diagnosis not present

## 2015-09-20 DIAGNOSIS — D509 Iron deficiency anemia, unspecified: Secondary | ICD-10-CM | POA: Diagnosis not present

## 2015-09-20 DIAGNOSIS — N186 End stage renal disease: Secondary | ICD-10-CM | POA: Diagnosis not present

## 2015-09-23 DIAGNOSIS — E118 Type 2 diabetes mellitus with unspecified complications: Secondary | ICD-10-CM | POA: Diagnosis not present

## 2015-09-23 DIAGNOSIS — D509 Iron deficiency anemia, unspecified: Secondary | ICD-10-CM | POA: Diagnosis not present

## 2015-09-23 DIAGNOSIS — N186 End stage renal disease: Secondary | ICD-10-CM | POA: Diagnosis not present

## 2015-09-25 DIAGNOSIS — E118 Type 2 diabetes mellitus with unspecified complications: Secondary | ICD-10-CM | POA: Diagnosis not present

## 2015-09-25 DIAGNOSIS — D509 Iron deficiency anemia, unspecified: Secondary | ICD-10-CM | POA: Diagnosis not present

## 2015-09-25 DIAGNOSIS — N186 End stage renal disease: Secondary | ICD-10-CM | POA: Diagnosis not present

## 2015-09-27 DIAGNOSIS — N186 End stage renal disease: Secondary | ICD-10-CM | POA: Diagnosis not present

## 2015-09-27 DIAGNOSIS — E118 Type 2 diabetes mellitus with unspecified complications: Secondary | ICD-10-CM | POA: Diagnosis not present

## 2015-09-27 DIAGNOSIS — D509 Iron deficiency anemia, unspecified: Secondary | ICD-10-CM | POA: Diagnosis not present

## 2015-09-30 DIAGNOSIS — N186 End stage renal disease: Secondary | ICD-10-CM | POA: Diagnosis not present

## 2015-09-30 DIAGNOSIS — D509 Iron deficiency anemia, unspecified: Secondary | ICD-10-CM | POA: Diagnosis not present

## 2015-09-30 DIAGNOSIS — E118 Type 2 diabetes mellitus with unspecified complications: Secondary | ICD-10-CM | POA: Diagnosis not present

## 2015-10-02 DIAGNOSIS — D509 Iron deficiency anemia, unspecified: Secondary | ICD-10-CM | POA: Diagnosis not present

## 2015-10-02 DIAGNOSIS — N186 End stage renal disease: Secondary | ICD-10-CM | POA: Diagnosis not present

## 2015-10-02 DIAGNOSIS — E118 Type 2 diabetes mellitus with unspecified complications: Secondary | ICD-10-CM | POA: Diagnosis not present

## 2015-10-04 DIAGNOSIS — D509 Iron deficiency anemia, unspecified: Secondary | ICD-10-CM | POA: Diagnosis not present

## 2015-10-04 DIAGNOSIS — E118 Type 2 diabetes mellitus with unspecified complications: Secondary | ICD-10-CM | POA: Diagnosis not present

## 2015-10-04 DIAGNOSIS — N186 End stage renal disease: Secondary | ICD-10-CM | POA: Diagnosis not present

## 2015-10-06 DIAGNOSIS — N186 End stage renal disease: Secondary | ICD-10-CM | POA: Diagnosis not present

## 2015-10-06 DIAGNOSIS — Z992 Dependence on renal dialysis: Secondary | ICD-10-CM | POA: Diagnosis not present

## 2015-10-06 DIAGNOSIS — N041 Nephrotic syndrome with focal and segmental glomerular lesions: Secondary | ICD-10-CM | POA: Diagnosis not present

## 2015-10-07 DIAGNOSIS — N186 End stage renal disease: Secondary | ICD-10-CM | POA: Diagnosis not present

## 2015-10-07 DIAGNOSIS — D509 Iron deficiency anemia, unspecified: Secondary | ICD-10-CM | POA: Diagnosis not present

## 2015-10-07 DIAGNOSIS — E118 Type 2 diabetes mellitus with unspecified complications: Secondary | ICD-10-CM | POA: Diagnosis not present

## 2015-10-07 DIAGNOSIS — Z23 Encounter for immunization: Secondary | ICD-10-CM | POA: Diagnosis not present

## 2015-10-09 DIAGNOSIS — N186 End stage renal disease: Secondary | ICD-10-CM | POA: Diagnosis not present

## 2015-10-09 DIAGNOSIS — E118 Type 2 diabetes mellitus with unspecified complications: Secondary | ICD-10-CM | POA: Diagnosis not present

## 2015-10-09 DIAGNOSIS — Z23 Encounter for immunization: Secondary | ICD-10-CM | POA: Diagnosis not present

## 2015-10-09 DIAGNOSIS — D509 Iron deficiency anemia, unspecified: Secondary | ICD-10-CM | POA: Diagnosis not present

## 2015-10-11 DIAGNOSIS — D509 Iron deficiency anemia, unspecified: Secondary | ICD-10-CM | POA: Diagnosis not present

## 2015-10-11 DIAGNOSIS — N186 End stage renal disease: Secondary | ICD-10-CM | POA: Diagnosis not present

## 2015-10-11 DIAGNOSIS — Z23 Encounter for immunization: Secondary | ICD-10-CM | POA: Diagnosis not present

## 2015-10-11 DIAGNOSIS — E118 Type 2 diabetes mellitus with unspecified complications: Secondary | ICD-10-CM | POA: Diagnosis not present

## 2015-10-13 ENCOUNTER — Ambulatory Visit (HOSPITAL_COMMUNITY): Payer: Medicare Other | Admitting: Hematology & Oncology

## 2015-10-13 NOTE — Progress Notes (Signed)
This encounter was created in error - please disregard.

## 2015-10-14 DIAGNOSIS — Z23 Encounter for immunization: Secondary | ICD-10-CM | POA: Diagnosis not present

## 2015-10-14 DIAGNOSIS — D509 Iron deficiency anemia, unspecified: Secondary | ICD-10-CM | POA: Diagnosis not present

## 2015-10-14 DIAGNOSIS — E118 Type 2 diabetes mellitus with unspecified complications: Secondary | ICD-10-CM | POA: Diagnosis not present

## 2015-10-14 DIAGNOSIS — N186 End stage renal disease: Secondary | ICD-10-CM | POA: Diagnosis not present

## 2015-10-16 DIAGNOSIS — Z23 Encounter for immunization: Secondary | ICD-10-CM | POA: Diagnosis not present

## 2015-10-16 DIAGNOSIS — N186 End stage renal disease: Secondary | ICD-10-CM | POA: Diagnosis not present

## 2015-10-16 DIAGNOSIS — E118 Type 2 diabetes mellitus with unspecified complications: Secondary | ICD-10-CM | POA: Diagnosis not present

## 2015-10-16 DIAGNOSIS — D509 Iron deficiency anemia, unspecified: Secondary | ICD-10-CM | POA: Diagnosis not present

## 2015-10-18 DIAGNOSIS — E118 Type 2 diabetes mellitus with unspecified complications: Secondary | ICD-10-CM | POA: Diagnosis not present

## 2015-10-18 DIAGNOSIS — D509 Iron deficiency anemia, unspecified: Secondary | ICD-10-CM | POA: Diagnosis not present

## 2015-10-18 DIAGNOSIS — N186 End stage renal disease: Secondary | ICD-10-CM | POA: Diagnosis not present

## 2015-10-18 DIAGNOSIS — Z23 Encounter for immunization: Secondary | ICD-10-CM | POA: Diagnosis not present

## 2015-10-21 ENCOUNTER — Encounter: Payer: Self-pay | Admitting: Vascular Surgery

## 2015-10-21 DIAGNOSIS — Z23 Encounter for immunization: Secondary | ICD-10-CM | POA: Diagnosis not present

## 2015-10-21 DIAGNOSIS — E118 Type 2 diabetes mellitus with unspecified complications: Secondary | ICD-10-CM | POA: Diagnosis not present

## 2015-10-21 DIAGNOSIS — N186 End stage renal disease: Secondary | ICD-10-CM | POA: Diagnosis not present

## 2015-10-21 DIAGNOSIS — D509 Iron deficiency anemia, unspecified: Secondary | ICD-10-CM | POA: Diagnosis not present

## 2015-10-23 DIAGNOSIS — N186 End stage renal disease: Secondary | ICD-10-CM | POA: Diagnosis not present

## 2015-10-23 DIAGNOSIS — Z23 Encounter for immunization: Secondary | ICD-10-CM | POA: Diagnosis not present

## 2015-10-23 DIAGNOSIS — E118 Type 2 diabetes mellitus with unspecified complications: Secondary | ICD-10-CM | POA: Diagnosis not present

## 2015-10-23 DIAGNOSIS — D509 Iron deficiency anemia, unspecified: Secondary | ICD-10-CM | POA: Diagnosis not present

## 2015-10-24 ENCOUNTER — Encounter: Payer: Medicare Other | Admitting: Vascular Surgery

## 2015-10-25 DIAGNOSIS — D509 Iron deficiency anemia, unspecified: Secondary | ICD-10-CM | POA: Diagnosis not present

## 2015-10-25 DIAGNOSIS — Z23 Encounter for immunization: Secondary | ICD-10-CM | POA: Diagnosis not present

## 2015-10-25 DIAGNOSIS — E118 Type 2 diabetes mellitus with unspecified complications: Secondary | ICD-10-CM | POA: Diagnosis not present

## 2015-10-25 DIAGNOSIS — N186 End stage renal disease: Secondary | ICD-10-CM | POA: Diagnosis not present

## 2015-10-27 DIAGNOSIS — N186 End stage renal disease: Secondary | ICD-10-CM | POA: Diagnosis not present

## 2015-10-27 DIAGNOSIS — D509 Iron deficiency anemia, unspecified: Secondary | ICD-10-CM | POA: Diagnosis not present

## 2015-10-27 DIAGNOSIS — Z23 Encounter for immunization: Secondary | ICD-10-CM | POA: Diagnosis not present

## 2015-10-27 DIAGNOSIS — E118 Type 2 diabetes mellitus with unspecified complications: Secondary | ICD-10-CM | POA: Diagnosis not present

## 2015-10-29 DIAGNOSIS — N186 End stage renal disease: Secondary | ICD-10-CM | POA: Diagnosis not present

## 2015-10-29 DIAGNOSIS — D509 Iron deficiency anemia, unspecified: Secondary | ICD-10-CM | POA: Diagnosis not present

## 2015-10-29 DIAGNOSIS — E118 Type 2 diabetes mellitus with unspecified complications: Secondary | ICD-10-CM | POA: Diagnosis not present

## 2015-10-29 DIAGNOSIS — Z23 Encounter for immunization: Secondary | ICD-10-CM | POA: Diagnosis not present

## 2015-11-01 DIAGNOSIS — N186 End stage renal disease: Secondary | ICD-10-CM | POA: Diagnosis not present

## 2015-11-01 DIAGNOSIS — E118 Type 2 diabetes mellitus with unspecified complications: Secondary | ICD-10-CM | POA: Diagnosis not present

## 2015-11-01 DIAGNOSIS — Z23 Encounter for immunization: Secondary | ICD-10-CM | POA: Diagnosis not present

## 2015-11-01 DIAGNOSIS — D509 Iron deficiency anemia, unspecified: Secondary | ICD-10-CM | POA: Diagnosis not present

## 2015-11-04 ENCOUNTER — Encounter: Payer: Self-pay | Admitting: Vascular Surgery

## 2015-11-04 DIAGNOSIS — E118 Type 2 diabetes mellitus with unspecified complications: Secondary | ICD-10-CM | POA: Diagnosis not present

## 2015-11-04 DIAGNOSIS — Z23 Encounter for immunization: Secondary | ICD-10-CM | POA: Diagnosis not present

## 2015-11-04 DIAGNOSIS — D509 Iron deficiency anemia, unspecified: Secondary | ICD-10-CM | POA: Diagnosis not present

## 2015-11-04 DIAGNOSIS — N186 End stage renal disease: Secondary | ICD-10-CM | POA: Diagnosis not present

## 2015-11-05 DIAGNOSIS — N041 Nephrotic syndrome with focal and segmental glomerular lesions: Secondary | ICD-10-CM | POA: Diagnosis not present

## 2015-11-05 DIAGNOSIS — N186 End stage renal disease: Secondary | ICD-10-CM | POA: Diagnosis not present

## 2015-11-05 DIAGNOSIS — Z992 Dependence on renal dialysis: Secondary | ICD-10-CM | POA: Diagnosis not present

## 2015-11-06 DIAGNOSIS — D509 Iron deficiency anemia, unspecified: Secondary | ICD-10-CM | POA: Diagnosis not present

## 2015-11-06 DIAGNOSIS — N186 End stage renal disease: Secondary | ICD-10-CM | POA: Diagnosis not present

## 2015-11-06 DIAGNOSIS — D631 Anemia in chronic kidney disease: Secondary | ICD-10-CM | POA: Diagnosis not present

## 2015-11-06 DIAGNOSIS — Z23 Encounter for immunization: Secondary | ICD-10-CM | POA: Diagnosis not present

## 2015-11-07 ENCOUNTER — Encounter: Payer: Self-pay | Admitting: Vascular Surgery

## 2015-11-07 ENCOUNTER — Ambulatory Visit (INDEPENDENT_AMBULATORY_CARE_PROVIDER_SITE_OTHER): Payer: Medicare Other | Admitting: Vascular Surgery

## 2015-11-07 VITALS — BP 140/80 | HR 90 | Temp 98.7°F | Ht 65.0 in | Wt 208.3 lb

## 2015-11-07 DIAGNOSIS — Z992 Dependence on renal dialysis: Secondary | ICD-10-CM

## 2015-11-07 DIAGNOSIS — N186 End stage renal disease: Secondary | ICD-10-CM

## 2015-11-07 NOTE — Progress Notes (Signed)
    Postoperative Access Visit   History of Present Illness  James [name] is a 59 y.o. year old male who presents for postoperative follow-up for: L RC AVF ligation, R BCAVF (Date: 09/15/15).  The patient's wounds are healed.  The patient notes no steal symptoms.  The patient is able to complete their activities of daily living.  The patient's current symptoms are: none.  For VQI Use Only  PRE-ADM LIVING: Home  AMB STATUS: Ambulatory  Physical Examination Filed Vitals:   11/07/15 0857  BP: 140/80  Pulse: 90  Temp: 98.7 F (37.1 C)   LUE: incision healed, palpable radial pulse RUE: Incision is  healed, skin feels warm, hand grip is 5/5, sensation in digits is intact, palpable thrill, bruit can be auscultated   Medical Decision Making  James [name] is a 59 y.o. year old male who presents s/p L RC AVF ligation, R BC AVF.  The patient's access is ready for use.  The patient's tunneled dialysis catheter can be removed after two successful cannulations and completed dialysis treatments.  Thank you for allowing [place] to participate in this patient's care.  Adele Barthel, MD [facility] Office: [phone] Pager: [phone]  11/07/2015, 9:19 AM

## 2015-11-08 DIAGNOSIS — N186 End stage renal disease: Secondary | ICD-10-CM | POA: Diagnosis not present

## 2015-11-08 DIAGNOSIS — D509 Iron deficiency anemia, unspecified: Secondary | ICD-10-CM | POA: Diagnosis not present

## 2015-11-08 DIAGNOSIS — D631 Anemia in chronic kidney disease: Secondary | ICD-10-CM | POA: Diagnosis not present

## 2015-11-08 DIAGNOSIS — Z23 Encounter for immunization: Secondary | ICD-10-CM | POA: Diagnosis not present

## 2015-11-10 ENCOUNTER — Encounter (HOSPITAL_COMMUNITY): Payer: Self-pay | Admitting: Hematology & Oncology

## 2015-11-10 ENCOUNTER — Encounter (HOSPITAL_COMMUNITY): Payer: Medicare Other | Attending: Hematology & Oncology | Admitting: Hematology & Oncology

## 2015-11-10 VITALS — BP 140/75 | HR 88 | Temp 98.8°F | Resp 18 | Wt 208.2 lb

## 2015-11-10 DIAGNOSIS — D649 Anemia, unspecified: Secondary | ICD-10-CM

## 2015-11-10 DIAGNOSIS — N186 End stage renal disease: Secondary | ICD-10-CM

## 2015-11-10 DIAGNOSIS — D472 Monoclonal gammopathy: Secondary | ICD-10-CM | POA: Insufficient documentation

## 2015-11-10 DIAGNOSIS — C9000 Multiple myeloma not having achieved remission: Secondary | ICD-10-CM | POA: Diagnosis not present

## 2015-11-10 DIAGNOSIS — Z992 Dependence on renal dialysis: Secondary | ICD-10-CM

## 2015-11-10 LAB — COMPREHENSIVE METABOLIC PANEL
ALBUMIN: 3.8 g/dL (ref 3.5–5.0)
ALK PHOS: 72 U/L (ref 38–126)
ALT: 11 U/L — AB (ref 17–63)
AST: 14 U/L — AB (ref 15–41)
Anion gap: 11 (ref 5–15)
BUN: 46 mg/dL — AB (ref 6–20)
CALCIUM: 8.9 mg/dL (ref 8.9–10.3)
CO2: 29 mmol/L (ref 22–32)
CREATININE: 9.34 mg/dL — AB (ref 0.61–1.24)
Chloride: 88 mmol/L — ABNORMAL LOW (ref 101–111)
GFR calc Af Amer: 6 mL/min — ABNORMAL LOW (ref >60)
GFR calc non Af Amer: 5 mL/min — ABNORMAL LOW (ref >60)
GLUCOSE: 205 mg/dL — AB (ref 65–99)
Potassium: 4.6 mmol/L (ref 3.5–5.1)
SODIUM: 128 mmol/L — AB (ref 135–145)
Total Bilirubin: 1 mg/dL (ref 0.3–1.2)
Total Protein: 10 g/dL — ABNORMAL HIGH (ref 6.5–8.1)

## 2015-11-10 LAB — CBC WITH DIFFERENTIAL/PLATELET
BASOS PCT: 1 %
Basophils Absolute: 0.1 10*3/uL (ref 0.0–0.1)
EOS ABS: 0.3 10*3/uL (ref 0.0–0.7)
EOS PCT: 4 %
HCT: 32.8 % — ABNORMAL LOW (ref 39.0–52.0)
HEMOGLOBIN: 11.4 g/dL — AB (ref 13.0–17.0)
LYMPHS PCT: 33 %
Lymphs Abs: 2.7 10*3/uL (ref 0.7–4.0)
MCH: 32.5 pg (ref 26.0–34.0)
MCHC: 34.8 g/dL (ref 30.0–36.0)
MCV: 93.4 fL (ref 78.0–100.0)
MONO ABS: 0.8 10*3/uL (ref 0.1–1.0)
Monocytes Relative: 10 %
Neutro Abs: 4.2 10*3/uL (ref 1.7–7.7)
Neutrophils Relative %: 52 %
Platelets: 284 10*3/uL (ref 150–400)
RBC: 3.51 MIL/uL — AB (ref 4.22–5.81)
RDW: 14.2 % (ref 11.5–15.5)
WBC: 8.1 10*3/uL (ref 4.0–10.5)

## 2015-11-10 LAB — LACTATE DEHYDROGENASE: LDH: 165 U/L (ref 98–192)

## 2015-11-10 LAB — SEDIMENTATION RATE: Sed Rate: 58 mm/hr — ABNORMAL HIGH (ref 0–16)

## 2015-11-10 NOTE — Progress Notes (Signed)
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 male is seen at the [facility] for follow-up of smoldering myeloma.   James [name] is here alone today. He states that dialysis is going ok. Says he's feeling ok, confirms having a good appetite, and denies any new problems.  He confirms that his breathing is okay and also denies any chest pain. He also denies any leg swelling.  He says he's ready for Christmas and had a great Thanksgiving. He says he's ready to do some eating over the holidays because "oh yeah, I love to eat."  He remarks that is not worried about anything and has no questions. He is here for ongoing observation.   Past Medical History  Diagnosis Date  . Hypertension   . CHF (congestive heart failure) ([place])   . Diabetes mellitus without complication ([place])     Type II  . Chronic kidney disease     TTHS  . Asthma     as a child    has Peripheral edema; CHF (congestive heart failure) ([place]); HTN (hypertension); DM (diabetes mellitus) ([place]); ESRD on dialysis ([place]); Anemia; Hypokalemia; Congestive heart disease ([place]); Hypoxia; Acute on chronic diastolic CHF (congestive heart failure), NYHA class 4 ([place]); and Multiple myeloma ([place]) on his problem list.     has No Known Allergies.  Current Outpatient Prescriptions on File Prior to Visit  Medication Sig Dispense Refill  . calcium acetate (PHOSLO) 667 MG capsule Take 2 capsules (1,334 mg total) by mouth 3 (three) times daily with meals. 90 capsule 0  . Darbepoetin Alfa (ARANESP) 100 MCG/0.5ML SOSY injection Inject 0.5 mLs (100 mcg total) into the vein every Thursday with hemodialysis. Will be given at dialysis.    Marland Kitchen gabapentin (NEURONTIN) 300 MG capsule Take 1 capsule (300 mg total) by mouth at bedtime. 30 capsule 0  . hydrALAZINE (APRESOLINE) 25 MG tablet Take 1 tablet (25 mg  total) by mouth 2 (two) times daily. (Patient taking differently: Take 12.5 mg by mouth 2 (two) times daily. ) 60 tablet 0  . insulin detemir (LEVEMIR) 100 UNIT/ML injection Inject 20 Units into the skin 2 (two) times daily.    . isosorbide mononitrate (IMDUR) 60 MG 24 hr tablet Take 1 tablet (60 mg total) by mouth daily. 30 tablet 0  . Nutritional Supplements (FEEDING SUPPLEMENT, NEPRO CARB STEADY,) LIQD Take 237 mLs by mouth 3 (three) times a week.     Marland Kitchen oxyCODONE (ROXICODONE) 5 MG immediate release tablet Take 1 tablet (5 mg total) by mouth every 6 (six) hours as needed. (Patient not taking: Reported on 11/10/2015) 30 tablet 0   No current facility-administered medications on file prior to visit.    Past Surgical History  Procedure Laterality Date  . Colonoscopy    . Av fistula placement Left 04/08/2015    Procedure: LEFT RADIOCEPHALIC ARTERIOVENOUS (AV) FISTULA CREATION;  Surgeon: Elam Dutch, MD;  Location: [place];  Service: Vascular;  Laterality: Left;  . Peripheral vascular catheterization N/A 07/04/2015    Procedure: Fistulagram;  Surgeon: Elam Dutch, MD;  Location: [place] [facility];  Service: Cardiovascular;  Laterality: N/A;  . Ligation of competing branches of arteriovenous fistula Left 07/16/2015    Procedure: LIGATION OF SIDE BRANCHES OF ARTERIOVENOUS FISTULA LEFT ARM;  Surgeon: Rosetta Posner, MD;  Location: [place];  Service: Vascular;  Laterality: Left;  . Peripheral vascular catheterization N/A 09/08/2015    Procedure: Fistulagram;  Surgeon: Conrad Bigfork, MD;  Location: [place] [facility];  Service: Cardiovascular;  Laterality: N/A;  . Ligation of arteriovenous  fistula Left 09/15/2015    Procedure: LIGATION OF RADIOCEPHALIC ARTERIOVENOUS FISTULA - LEFT ARM;  Surgeon: Conrad Alston, MD;  Location: [place];  Service: Vascular;  Laterality: Left;  . Av fistula placement Right 09/15/2015    Procedure: RADIOCEPHALIC VERSUS BRACHIOCEPHALIC ARTERIOVENOUS (AV) FISTULA CREATION - RIGHT  ARM;  Surgeon: Conrad Crittenden, MD;  Location: [place];  Service: Vascular;  Laterality: Right;    Denies any headaches, dizziness, double vision, fevers, chills, night sweats, nausea, vomiting, diarrhea, constipation, chest pain, heart palpitations, shortness of breath, blood in stool, black tarry stool, urinary pain, urinary burning, urinary frequency, hematuria. 14 point review of systems was performed and is negative except as detailed under history of present illness and above   PHYSICAL EXAMINATION  ECOG PERFORMANCE STATUS: 0 - Asymptomatic  Filed Vitals:   11/10/15 0900  BP: 140/75  Pulse: 88  Temp: 98.8 F (37.1 C)  Resp: 18    GENERAL:alert, no distress, well nourished, well developed, comfortable, cooperative and smiling Fistula in left arm  SKIN: skin color, texture, turgor are normal, no rashes or significant lesions HEAD: Normocephalic, No masses, lesions, tenderness or abnormalities EYES: normal, PERRLA, EOMI, Conjunctiva are pink and non-injected EARS: External ears normal OROPHARYNX:lips, buccal mucosa, and tongue normal, edentulous and mucous membranes are moist  NECK: supple, no adenopathy, thyroid normal size, non-tender, without nodularity, no stridor, non-tender, trachea midline LYMPH:  no palpable lymphadenopathy, no hepatosplenomegaly BREAST:not examined LUNGS: positive findings: wheezing  diffusely HEART: regular rate & rhythm, no murmurs, no gallops, S1 normal and S2 normal ABDOMEN:abdomen soft, non-tender, normal bowel sounds, no masses or organomegaly and no hepatosplenomegaly BACK: Back symmetric, no curvature., No CVA tenderness EXTREMITIES:less then 2 second capillary refill, no joint deformities, effusion, or inflammation, no edema, no skin discoloration, no clubbing, no cyanosis  NEURO: alert & oriented x 3 with fluent speech, no focal motor/sensory deficits, gait normal   LABORATORY DATA: I have reviewed the data as listed.  CBC    Component  Value Date/Time   WBC 8.1 11/10/2015 1043   RBC 3.51* 11/10/2015 1043   RBC 3.18* 03/23/2015 0330   HGB 11.4* 11/10/2015 1043   HCT 32.8* 11/10/2015 1043   PLT 284 11/10/2015 1043   MCV 93.4 11/10/2015 1043   MCH 32.5 11/10/2015 1043   MCHC 34.8 11/10/2015 1043   RDW 14.2 11/10/2015 1043   LYMPHSABS 2.7 11/10/2015 1043   MONOABS 0.8 11/10/2015 1043   EOSABS 0.3 11/10/2015 1043   BASOSABS 0.1 11/10/2015 1043      Chemistry      Component Value Date/Time   NA 128* 11/10/2015 1043   K 4.6 11/10/2015 1043   CL 88* 11/10/2015 1043   CO2 29 11/10/2015 1043   BUN 46* 11/10/2015 1043   CREATININE 9.34* 11/10/2015 1043      Component Value Date/Time   CALCIUM 8.9 11/10/2015 1043   ALKPHOS 72 11/10/2015 1043   AST 14* 11/10/2015 1043   ALT 11* 11/10/2015 1043   BILITOT 1.0 11/10/2015 1043      RADIOLOGY:  CLINICAL DATA: Metastatic bone survey, multiple myeloma  EXAM: METASTATIC BONE SURVEY  COMPARISON: 03/22/2015  FINDINGS: Twenty-three images of skeletal structures submitted. Frontal view of the chest shows no destructive bony lesions. There is small left  pleural effusion with left basilar atelectasis or infiltrate. Frontal and lateral view of the skull shows no lytic or blastic bone lesions. Frontal view bilateral shoulder and bilateral humerus shows no lytic or blastic lesions. Frontal and lateral view of the cervical spine shows the alignment preserved. No lytic or blastic lesions.  Frontal and lateral view of thoracic spine and lumbar spine shows alignment preserved. No lytic or blastic lesions. Frontal view bilateral hips and femur shows no lytic or blastic lesions. Frontal view bilateral tibia and fibula shows no lytic or blastic lesions. Bilateral forearm shows no lytic or blastic lesion.  IMPRESSION: No lytic or blastic lesions are identified. No evidence of metastatic disease. There is small left pleural effusion left basilar atelectasis or  infiltrate.   Electronically Signed  By: Lahoma Crocker M.D.  On: 03/28/2015 14:27     PATHOLOGY:  Diagnosis Bone Marrow, Aspirate,Biopsy, and Clot, right iliac - NORMOCELLULAR BONE MARROW FOR AGE WITH PLASMA CELL NEOPLASM. - TRILINEAGE HEMATOPOIESIS. - SEE COMMENT. PERIPHERAL BLOOD: - NORMOCYTIC-NORMOCHROMIC ANEMIA Diagnosis Note The bone marrow is generally normocellular for age with trilineage hematopoiesis and nonspecific myeloid changes. However, the plasma cells are increased in number representing 11% of all cells associated with atypical cytomorphologic features. Immunohistochemical stains show that the plasma cells are kappa light chain restricted consistent with plasma cell neoplasm. Correlation with cytogenetic studies is recommended. (BNS:kh/ds 04-03-15) Susanne Greenhouse MD Pathologist, Electronic Signature (Case signed 04/04/2015)   Diagnosis Kidney, biopsy w/ EM l, left cortex COLLAPSING VARIANT OF FOCAL AND SEGMENTAL GLOMERULOSCLEROSIS. DIABETIC GLOMERULOSCLEROSIS. MODERATE ARTERIONEPHROSLCEROSIS. NO EVIDENCE OF A MONOCLONAL IMMUNOGLOBULIN DEPOSITION DISEASE Diagnosis Note Diagnostic evaluation conducted by Providence Lanius. Candiss Norse, MD @ [facility] Nephropathology; [id number] Claudette Laws MD Pathologist, Electronic Signature (Case signed 04/10/2015)    ASSESSMENT AND PLAN:  Smoldering myeloma End-stage renal disease on dialysis Kidney biopsy on 04/02/2015 with collapsing FSGS Negative Myeloma Survey  It is felt that his end-stage renal disease is due to collapsing FSGS. He did have a renal biopsy in April that showed collapsing FSGS that is felt to be explanatory of his renal course. I have discussed his case with Dr. Melba Coon at [facility] who does not feel that the patient's M protein is contributing to his renal disease. Therefore we will continue to hold off on calling him "active myeloma" and/or treating him at this time. His anemia can most certainly be related to his  end-stage renal disease and dialysis.  I would recommend close observation. He will need a repeat Myeloma Survey in April.  I will see him back at that time with repeat labs as well. He will be nofitied of any concern on myeloma evaluation today.   Orders Placed This Encounter  Procedures  . CBC with Differential    Standing Status: Future     Number of Occurrences: 1     Standing Expiration Date: 11/09/2016  . Comprehensive metabolic panel    Standing Status: Future     Number of Occurrences: 1     Standing Expiration Date: 11/09/2016  . Lactate dehydrogenase    Standing Status: Future     Number of Occurrences: 1     Standing Expiration Date: 11/09/2016  . Sedimentation rate    Standing Status: Future     Number of Occurrences: 1     Standing Expiration Date: 11/09/2016  . Multiple Myeloma Panel (SPEP&IFE w/QIG)    Standing Status: Future     Number of Occurrences: 1     Standing Expiration Date: 11/09/2016  .  Kappa/lambda light chains    Standing Status: Future     Number of Occurrences: 1     Standing Expiration Date: 11/09/2016  . Beta 2 microglobuline, serum    Standing Status: Future     Number of Occurrences: 1     Standing Expiration Date: 11/09/2016  . Immunofixation electrophoresis    Standing Status: Future     Number of Occurrences: 1     Standing Expiration Date: 11/09/2016  . IgG, IgA, IgM    Standing Status: Future     Number of Occurrences: 1     Standing Expiration Date: 11/09/2016   All questions were answered. The patient knows to call the clinic with any problems, questions or concerns. We can certainly see the patient much sooner if necessary.   This document serves as a record of services personally performed by Ancil Linsey, MD. It was created on her behalf by Toni Amend, a trained medical scribe. The creation of this record is based on the scribe's personal observations and the provider's statements to them. This document has been checked and  approved by the attending provider.  I have reviewed the above documentation for accuracy and completeness, and I agree with the above.  This note is electronically signed.  Kelby Fam. Whitney Muse, MD

## 2015-11-10 NOTE — Patient Instructions (Addendum)
[place] at [facility] Discharge Instructions  RECOMMENDATIONS MADE BY THE CONSULTANT AND ANY TEST RESULTS WILL BE SENT TO YOUR REFERRING PHYSICIAN.   Exam completed by Dr Whitney Muse today Lab work today Return to see the doctor after your myeloma surevey (x-rays) Please call the clinic if you have any questions or concerns    Thank you for choosing [place] at [facility] to provide your oncology and hematology care.  To afford each patient quality time with our provider, please arrive at least 15 minutes before your scheduled appointment time.    You need to re-schedule your appointment should you arrive 10 or more minutes late.  We strive to give you quality time with our providers, and arriving late affects you and other patients whose appointments are after yours.  Also, if you no show three or more times for appointments you may be dismissed from the clinic at the providers discretion.     Again, thank you for choosing [facility].  Our hope is that these requests will decrease the amount of time that you wait before being seen by our physicians.       _____________________________________________________________  Should you have questions after your visit to [facility], please contact our office at [phone] between the hours of 8:30 a.m. and 4:30 p.m.  Voicemails left after 4:30 p.m. will not be returned until the following business day.  For prescription refill requests, have your pharmacy contact our office.

## 2015-11-11 DIAGNOSIS — D631 Anemia in chronic kidney disease: Secondary | ICD-10-CM | POA: Diagnosis not present

## 2015-11-11 DIAGNOSIS — Z23 Encounter for immunization: Secondary | ICD-10-CM | POA: Diagnosis not present

## 2015-11-11 DIAGNOSIS — D509 Iron deficiency anemia, unspecified: Secondary | ICD-10-CM | POA: Diagnosis not present

## 2015-11-11 DIAGNOSIS — N186 End stage renal disease: Secondary | ICD-10-CM | POA: Diagnosis not present

## 2015-11-11 LAB — KAPPA/LAMBDA LIGHT CHAINS
KAPPA FREE LGHT CHN: 145.42 mg/L — AB (ref 3.30–19.40)
KAPPA, LAMDA LIGHT CHAIN RATIO: 3.99 — AB (ref 0.26–1.65)
Lambda free light chains: 36.43 mg/L — ABNORMAL HIGH (ref 5.71–26.30)

## 2015-11-11 LAB — IGG, IGA, IGM
IGG (IMMUNOGLOBIN G), SERUM: 3846 mg/dL — AB (ref 700–1600)
IgA: 103 mg/dL (ref 90–386)
IgM, Serum: 20 mg/dL (ref 20–172)

## 2015-11-12 LAB — MULTIPLE MYELOMA PANEL, SERUM
ALBUMIN SERPL ELPH-MCNC: 3.9 g/dL (ref 2.9–4.4)
ALPHA2 GLOB SERPL ELPH-MCNC: 0.9 g/dL (ref 0.4–1.0)
Albumin/Glob SerPl: 0.8 (ref 0.7–1.7)
Alpha 1: 0.2 g/dL (ref 0.0–0.4)
B-GLOBULIN SERPL ELPH-MCNC: 1 g/dL (ref 0.7–1.3)
Gamma Glob SerPl Elph-Mcnc: 3.2 g/dL — ABNORMAL HIGH (ref 0.4–1.8)
Globulin, Total: 5.3 g/dL — ABNORMAL HIGH (ref 2.2–3.9)
IGG (IMMUNOGLOBIN G), SERUM: 3794 mg/dL — AB (ref 700–1600)
IgA: 105 mg/dL (ref 90–386)
IgM, Serum: 21 mg/dL (ref 20–172)
M PROTEIN SERPL ELPH-MCNC: 2.8 g/dL — AB
TOTAL PROTEIN ELP: 9.2 g/dL — AB (ref 6.0–8.5)

## 2015-11-12 LAB — IMMUNOFIXATION ELECTROPHORESIS
IGM, SERUM: 22 mg/dL (ref 20–172)
IgA: 108 mg/dL (ref 90–386)
IgG (Immunoglobin G), Serum: 3915 mg/dL — ABNORMAL HIGH (ref 700–1600)
Total Protein ELP: 9.1 g/dL — ABNORMAL HIGH (ref 6.0–8.5)

## 2015-11-12 LAB — BETA 2 MICROGLOBULIN, SERUM: Beta-2 Microglobulin: 22.1 mg/L — ABNORMAL HIGH (ref 0.6–2.4)

## 2015-11-13 DIAGNOSIS — Z23 Encounter for immunization: Secondary | ICD-10-CM | POA: Diagnosis not present

## 2015-11-13 DIAGNOSIS — N186 End stage renal disease: Secondary | ICD-10-CM | POA: Diagnosis not present

## 2015-11-13 DIAGNOSIS — D509 Iron deficiency anemia, unspecified: Secondary | ICD-10-CM | POA: Diagnosis not present

## 2015-11-13 DIAGNOSIS — D631 Anemia in chronic kidney disease: Secondary | ICD-10-CM | POA: Diagnosis not present

## 2015-11-15 DIAGNOSIS — D509 Iron deficiency anemia, unspecified: Secondary | ICD-10-CM | POA: Diagnosis not present

## 2015-11-15 DIAGNOSIS — N186 End stage renal disease: Secondary | ICD-10-CM | POA: Diagnosis not present

## 2015-11-15 DIAGNOSIS — Z23 Encounter for immunization: Secondary | ICD-10-CM | POA: Diagnosis not present

## 2015-11-15 DIAGNOSIS — D631 Anemia in chronic kidney disease: Secondary | ICD-10-CM | POA: Diagnosis not present

## 2015-11-18 DIAGNOSIS — D509 Iron deficiency anemia, unspecified: Secondary | ICD-10-CM | POA: Diagnosis not present

## 2015-11-18 DIAGNOSIS — D631 Anemia in chronic kidney disease: Secondary | ICD-10-CM | POA: Diagnosis not present

## 2015-11-18 DIAGNOSIS — N186 End stage renal disease: Secondary | ICD-10-CM | POA: Diagnosis not present

## 2015-11-18 DIAGNOSIS — Z23 Encounter for immunization: Secondary | ICD-10-CM | POA: Diagnosis not present

## 2015-11-20 DIAGNOSIS — N186 End stage renal disease: Secondary | ICD-10-CM | POA: Diagnosis not present

## 2015-11-20 DIAGNOSIS — Z23 Encounter for immunization: Secondary | ICD-10-CM | POA: Diagnosis not present

## 2015-11-20 DIAGNOSIS — D631 Anemia in chronic kidney disease: Secondary | ICD-10-CM | POA: Diagnosis not present

## 2015-11-20 DIAGNOSIS — D509 Iron deficiency anemia, unspecified: Secondary | ICD-10-CM | POA: Diagnosis not present

## 2015-11-22 DIAGNOSIS — D631 Anemia in chronic kidney disease: Secondary | ICD-10-CM | POA: Diagnosis not present

## 2015-11-22 DIAGNOSIS — D509 Iron deficiency anemia, unspecified: Secondary | ICD-10-CM | POA: Diagnosis not present

## 2015-11-22 DIAGNOSIS — Z23 Encounter for immunization: Secondary | ICD-10-CM | POA: Diagnosis not present

## 2015-11-22 DIAGNOSIS — N186 End stage renal disease: Secondary | ICD-10-CM | POA: Diagnosis not present

## 2015-11-25 DIAGNOSIS — D631 Anemia in chronic kidney disease: Secondary | ICD-10-CM | POA: Diagnosis not present

## 2015-11-25 DIAGNOSIS — D509 Iron deficiency anemia, unspecified: Secondary | ICD-10-CM | POA: Diagnosis not present

## 2015-11-25 DIAGNOSIS — Z23 Encounter for immunization: Secondary | ICD-10-CM | POA: Diagnosis not present

## 2015-11-25 DIAGNOSIS — N186 End stage renal disease: Secondary | ICD-10-CM | POA: Diagnosis not present

## 2015-11-27 DIAGNOSIS — Z23 Encounter for immunization: Secondary | ICD-10-CM | POA: Diagnosis not present

## 2015-11-27 DIAGNOSIS — D631 Anemia in chronic kidney disease: Secondary | ICD-10-CM | POA: Diagnosis not present

## 2015-11-27 DIAGNOSIS — N186 End stage renal disease: Secondary | ICD-10-CM | POA: Diagnosis not present

## 2015-11-27 DIAGNOSIS — D509 Iron deficiency anemia, unspecified: Secondary | ICD-10-CM | POA: Diagnosis not present

## 2015-11-29 DIAGNOSIS — N186 End stage renal disease: Secondary | ICD-10-CM | POA: Diagnosis not present

## 2015-11-29 DIAGNOSIS — D631 Anemia in chronic kidney disease: Secondary | ICD-10-CM | POA: Diagnosis not present

## 2015-11-29 DIAGNOSIS — Z23 Encounter for immunization: Secondary | ICD-10-CM | POA: Diagnosis not present

## 2015-11-29 DIAGNOSIS — D509 Iron deficiency anemia, unspecified: Secondary | ICD-10-CM | POA: Diagnosis not present

## 2015-12-02 DIAGNOSIS — N186 End stage renal disease: Secondary | ICD-10-CM | POA: Diagnosis not present

## 2015-12-02 DIAGNOSIS — Z23 Encounter for immunization: Secondary | ICD-10-CM | POA: Diagnosis not present

## 2015-12-02 DIAGNOSIS — D631 Anemia in chronic kidney disease: Secondary | ICD-10-CM | POA: Diagnosis not present

## 2015-12-02 DIAGNOSIS — D509 Iron deficiency anemia, unspecified: Secondary | ICD-10-CM | POA: Diagnosis not present

## 2015-12-04 DIAGNOSIS — Z23 Encounter for immunization: Secondary | ICD-10-CM | POA: Diagnosis not present

## 2015-12-04 DIAGNOSIS — D509 Iron deficiency anemia, unspecified: Secondary | ICD-10-CM | POA: Diagnosis not present

## 2015-12-04 DIAGNOSIS — D631 Anemia in chronic kidney disease: Secondary | ICD-10-CM | POA: Diagnosis not present

## 2015-12-04 DIAGNOSIS — N186 End stage renal disease: Secondary | ICD-10-CM | POA: Diagnosis not present

## 2015-12-06 DIAGNOSIS — N041 Nephrotic syndrome with focal and segmental glomerular lesions: Secondary | ICD-10-CM | POA: Diagnosis not present

## 2015-12-06 DIAGNOSIS — D631 Anemia in chronic kidney disease: Secondary | ICD-10-CM | POA: Diagnosis not present

## 2015-12-06 DIAGNOSIS — D509 Iron deficiency anemia, unspecified: Secondary | ICD-10-CM | POA: Diagnosis not present

## 2015-12-06 DIAGNOSIS — Z992 Dependence on renal dialysis: Secondary | ICD-10-CM | POA: Diagnosis not present

## 2015-12-06 DIAGNOSIS — Z23 Encounter for immunization: Secondary | ICD-10-CM | POA: Diagnosis not present

## 2015-12-06 DIAGNOSIS — N186 End stage renal disease: Secondary | ICD-10-CM | POA: Diagnosis not present

## 2015-12-09 DIAGNOSIS — N2581 Secondary hyperparathyroidism of renal origin: Secondary | ICD-10-CM | POA: Diagnosis not present

## 2015-12-09 DIAGNOSIS — E118 Type 2 diabetes mellitus with unspecified complications: Secondary | ICD-10-CM | POA: Diagnosis not present

## 2015-12-09 DIAGNOSIS — N186 End stage renal disease: Secondary | ICD-10-CM | POA: Diagnosis not present

## 2015-12-09 DIAGNOSIS — Z23 Encounter for immunization: Secondary | ICD-10-CM | POA: Diagnosis not present

## 2015-12-09 DIAGNOSIS — D509 Iron deficiency anemia, unspecified: Secondary | ICD-10-CM | POA: Diagnosis not present

## 2015-12-11 DIAGNOSIS — D509 Iron deficiency anemia, unspecified: Secondary | ICD-10-CM | POA: Diagnosis not present

## 2015-12-11 DIAGNOSIS — N2581 Secondary hyperparathyroidism of renal origin: Secondary | ICD-10-CM | POA: Diagnosis not present

## 2015-12-11 DIAGNOSIS — N186 End stage renal disease: Secondary | ICD-10-CM | POA: Diagnosis not present

## 2015-12-11 DIAGNOSIS — Z23 Encounter for immunization: Secondary | ICD-10-CM | POA: Diagnosis not present

## 2015-12-11 DIAGNOSIS — E118 Type 2 diabetes mellitus with unspecified complications: Secondary | ICD-10-CM | POA: Diagnosis not present

## 2015-12-13 DIAGNOSIS — N2581 Secondary hyperparathyroidism of renal origin: Secondary | ICD-10-CM | POA: Diagnosis not present

## 2015-12-13 DIAGNOSIS — Z23 Encounter for immunization: Secondary | ICD-10-CM | POA: Diagnosis not present

## 2015-12-13 DIAGNOSIS — N186 End stage renal disease: Secondary | ICD-10-CM | POA: Diagnosis not present

## 2015-12-13 DIAGNOSIS — E118 Type 2 diabetes mellitus with unspecified complications: Secondary | ICD-10-CM | POA: Diagnosis not present

## 2015-12-13 DIAGNOSIS — D509 Iron deficiency anemia, unspecified: Secondary | ICD-10-CM | POA: Diagnosis not present

## 2015-12-16 DIAGNOSIS — Z23 Encounter for immunization: Secondary | ICD-10-CM | POA: Diagnosis not present

## 2015-12-16 DIAGNOSIS — E118 Type 2 diabetes mellitus with unspecified complications: Secondary | ICD-10-CM | POA: Diagnosis not present

## 2015-12-16 DIAGNOSIS — N2581 Secondary hyperparathyroidism of renal origin: Secondary | ICD-10-CM | POA: Diagnosis not present

## 2015-12-16 DIAGNOSIS — D509 Iron deficiency anemia, unspecified: Secondary | ICD-10-CM | POA: Diagnosis not present

## 2015-12-16 DIAGNOSIS — N186 End stage renal disease: Secondary | ICD-10-CM | POA: Diagnosis not present

## 2015-12-17 DIAGNOSIS — N186 End stage renal disease: Secondary | ICD-10-CM | POA: Diagnosis not present

## 2015-12-17 DIAGNOSIS — T82858A Stenosis of vascular prosthetic devices, implants and grafts, initial encounter: Secondary | ICD-10-CM | POA: Diagnosis not present

## 2015-12-17 DIAGNOSIS — I871 Compression of vein: Secondary | ICD-10-CM | POA: Diagnosis not present

## 2015-12-17 DIAGNOSIS — Z992 Dependence on renal dialysis: Secondary | ICD-10-CM | POA: Diagnosis not present

## 2015-12-18 DIAGNOSIS — N2581 Secondary hyperparathyroidism of renal origin: Secondary | ICD-10-CM | POA: Diagnosis not present

## 2015-12-18 DIAGNOSIS — Z23 Encounter for immunization: Secondary | ICD-10-CM | POA: Diagnosis not present

## 2015-12-18 DIAGNOSIS — E118 Type 2 diabetes mellitus with unspecified complications: Secondary | ICD-10-CM | POA: Diagnosis not present

## 2015-12-18 DIAGNOSIS — N186 End stage renal disease: Secondary | ICD-10-CM | POA: Diagnosis not present

## 2015-12-18 DIAGNOSIS — D509 Iron deficiency anemia, unspecified: Secondary | ICD-10-CM | POA: Diagnosis not present

## 2015-12-20 DIAGNOSIS — D509 Iron deficiency anemia, unspecified: Secondary | ICD-10-CM | POA: Diagnosis not present

## 2015-12-20 DIAGNOSIS — N2581 Secondary hyperparathyroidism of renal origin: Secondary | ICD-10-CM | POA: Diagnosis not present

## 2015-12-20 DIAGNOSIS — N186 End stage renal disease: Secondary | ICD-10-CM | POA: Diagnosis not present

## 2015-12-20 DIAGNOSIS — E118 Type 2 diabetes mellitus with unspecified complications: Secondary | ICD-10-CM | POA: Diagnosis not present

## 2015-12-20 DIAGNOSIS — Z23 Encounter for immunization: Secondary | ICD-10-CM | POA: Diagnosis not present

## 2015-12-23 DIAGNOSIS — E118 Type 2 diabetes mellitus with unspecified complications: Secondary | ICD-10-CM | POA: Diagnosis not present

## 2015-12-23 DIAGNOSIS — N186 End stage renal disease: Secondary | ICD-10-CM | POA: Diagnosis not present

## 2015-12-23 DIAGNOSIS — N2581 Secondary hyperparathyroidism of renal origin: Secondary | ICD-10-CM | POA: Diagnosis not present

## 2015-12-23 DIAGNOSIS — Z23 Encounter for immunization: Secondary | ICD-10-CM | POA: Diagnosis not present

## 2015-12-23 DIAGNOSIS — D509 Iron deficiency anemia, unspecified: Secondary | ICD-10-CM | POA: Diagnosis not present

## 2015-12-25 DIAGNOSIS — Z23 Encounter for immunization: Secondary | ICD-10-CM | POA: Diagnosis not present

## 2015-12-25 DIAGNOSIS — E118 Type 2 diabetes mellitus with unspecified complications: Secondary | ICD-10-CM | POA: Diagnosis not present

## 2015-12-25 DIAGNOSIS — N2581 Secondary hyperparathyroidism of renal origin: Secondary | ICD-10-CM | POA: Diagnosis not present

## 2015-12-25 DIAGNOSIS — D509 Iron deficiency anemia, unspecified: Secondary | ICD-10-CM | POA: Diagnosis not present

## 2015-12-25 DIAGNOSIS — N186 End stage renal disease: Secondary | ICD-10-CM | POA: Diagnosis not present

## 2015-12-27 DIAGNOSIS — Z23 Encounter for immunization: Secondary | ICD-10-CM | POA: Diagnosis not present

## 2015-12-27 DIAGNOSIS — N2581 Secondary hyperparathyroidism of renal origin: Secondary | ICD-10-CM | POA: Diagnosis not present

## 2015-12-27 DIAGNOSIS — E118 Type 2 diabetes mellitus with unspecified complications: Secondary | ICD-10-CM | POA: Diagnosis not present

## 2015-12-27 DIAGNOSIS — N186 End stage renal disease: Secondary | ICD-10-CM | POA: Diagnosis not present

## 2015-12-27 DIAGNOSIS — D509 Iron deficiency anemia, unspecified: Secondary | ICD-10-CM | POA: Diagnosis not present

## 2015-12-30 DIAGNOSIS — N2581 Secondary hyperparathyroidism of renal origin: Secondary | ICD-10-CM | POA: Diagnosis not present

## 2015-12-30 DIAGNOSIS — E118 Type 2 diabetes mellitus with unspecified complications: Secondary | ICD-10-CM | POA: Diagnosis not present

## 2015-12-30 DIAGNOSIS — N186 End stage renal disease: Secondary | ICD-10-CM | POA: Diagnosis not present

## 2015-12-30 DIAGNOSIS — Z23 Encounter for immunization: Secondary | ICD-10-CM | POA: Diagnosis not present

## 2015-12-30 DIAGNOSIS — D509 Iron deficiency anemia, unspecified: Secondary | ICD-10-CM | POA: Diagnosis not present

## 2016-01-01 DIAGNOSIS — D509 Iron deficiency anemia, unspecified: Secondary | ICD-10-CM | POA: Diagnosis not present

## 2016-01-01 DIAGNOSIS — Z23 Encounter for immunization: Secondary | ICD-10-CM | POA: Diagnosis not present

## 2016-01-01 DIAGNOSIS — N2581 Secondary hyperparathyroidism of renal origin: Secondary | ICD-10-CM | POA: Diagnosis not present

## 2016-01-01 DIAGNOSIS — E118 Type 2 diabetes mellitus with unspecified complications: Secondary | ICD-10-CM | POA: Diagnosis not present

## 2016-01-01 DIAGNOSIS — N186 End stage renal disease: Secondary | ICD-10-CM | POA: Diagnosis not present

## 2016-01-03 DIAGNOSIS — E118 Type 2 diabetes mellitus with unspecified complications: Secondary | ICD-10-CM | POA: Diagnosis not present

## 2016-01-03 DIAGNOSIS — N2581 Secondary hyperparathyroidism of renal origin: Secondary | ICD-10-CM | POA: Diagnosis not present

## 2016-01-03 DIAGNOSIS — Z23 Encounter for immunization: Secondary | ICD-10-CM | POA: Diagnosis not present

## 2016-01-03 DIAGNOSIS — D509 Iron deficiency anemia, unspecified: Secondary | ICD-10-CM | POA: Diagnosis not present

## 2016-01-03 DIAGNOSIS — N186 End stage renal disease: Secondary | ICD-10-CM | POA: Diagnosis not present

## 2016-01-06 DIAGNOSIS — N186 End stage renal disease: Secondary | ICD-10-CM | POA: Diagnosis not present

## 2016-01-06 DIAGNOSIS — E118 Type 2 diabetes mellitus with unspecified complications: Secondary | ICD-10-CM | POA: Diagnosis not present

## 2016-01-06 DIAGNOSIS — Z992 Dependence on renal dialysis: Secondary | ICD-10-CM | POA: Diagnosis not present

## 2016-01-06 DIAGNOSIS — N2581 Secondary hyperparathyroidism of renal origin: Secondary | ICD-10-CM | POA: Diagnosis not present

## 2016-01-06 DIAGNOSIS — D509 Iron deficiency anemia, unspecified: Secondary | ICD-10-CM | POA: Diagnosis not present

## 2016-01-06 DIAGNOSIS — N041 Nephrotic syndrome with focal and segmental glomerular lesions: Secondary | ICD-10-CM | POA: Diagnosis not present

## 2016-01-06 DIAGNOSIS — Z23 Encounter for immunization: Secondary | ICD-10-CM | POA: Diagnosis not present

## 2016-01-08 DIAGNOSIS — N2581 Secondary hyperparathyroidism of renal origin: Secondary | ICD-10-CM | POA: Diagnosis not present

## 2016-01-08 DIAGNOSIS — N186 End stage renal disease: Secondary | ICD-10-CM | POA: Diagnosis not present

## 2016-01-08 DIAGNOSIS — E118 Type 2 diabetes mellitus with unspecified complications: Secondary | ICD-10-CM | POA: Diagnosis not present

## 2016-01-08 DIAGNOSIS — D631 Anemia in chronic kidney disease: Secondary | ICD-10-CM | POA: Diagnosis not present

## 2016-01-10 DIAGNOSIS — D631 Anemia in chronic kidney disease: Secondary | ICD-10-CM | POA: Diagnosis not present

## 2016-01-10 DIAGNOSIS — N186 End stage renal disease: Secondary | ICD-10-CM | POA: Diagnosis not present

## 2016-01-10 DIAGNOSIS — E118 Type 2 diabetes mellitus with unspecified complications: Secondary | ICD-10-CM | POA: Diagnosis not present

## 2016-01-10 DIAGNOSIS — N2581 Secondary hyperparathyroidism of renal origin: Secondary | ICD-10-CM | POA: Diagnosis not present

## 2016-01-13 DIAGNOSIS — E118 Type 2 diabetes mellitus with unspecified complications: Secondary | ICD-10-CM | POA: Diagnosis not present

## 2016-01-13 DIAGNOSIS — D631 Anemia in chronic kidney disease: Secondary | ICD-10-CM | POA: Diagnosis not present

## 2016-01-13 DIAGNOSIS — N2581 Secondary hyperparathyroidism of renal origin: Secondary | ICD-10-CM | POA: Diagnosis not present

## 2016-01-13 DIAGNOSIS — N186 End stage renal disease: Secondary | ICD-10-CM | POA: Diagnosis not present

## 2016-01-15 DIAGNOSIS — D631 Anemia in chronic kidney disease: Secondary | ICD-10-CM | POA: Diagnosis not present

## 2016-01-15 DIAGNOSIS — E118 Type 2 diabetes mellitus with unspecified complications: Secondary | ICD-10-CM | POA: Diagnosis not present

## 2016-01-15 DIAGNOSIS — N186 End stage renal disease: Secondary | ICD-10-CM | POA: Diagnosis not present

## 2016-01-15 DIAGNOSIS — N2581 Secondary hyperparathyroidism of renal origin: Secondary | ICD-10-CM | POA: Diagnosis not present

## 2016-01-17 DIAGNOSIS — N186 End stage renal disease: Secondary | ICD-10-CM | POA: Diagnosis not present

## 2016-01-17 DIAGNOSIS — N2581 Secondary hyperparathyroidism of renal origin: Secondary | ICD-10-CM | POA: Diagnosis not present

## 2016-01-17 DIAGNOSIS — E118 Type 2 diabetes mellitus with unspecified complications: Secondary | ICD-10-CM | POA: Diagnosis not present

## 2016-01-17 DIAGNOSIS — D631 Anemia in chronic kidney disease: Secondary | ICD-10-CM | POA: Diagnosis not present

## 2016-01-20 DIAGNOSIS — N186 End stage renal disease: Secondary | ICD-10-CM | POA: Diagnosis not present

## 2016-01-20 DIAGNOSIS — D631 Anemia in chronic kidney disease: Secondary | ICD-10-CM | POA: Diagnosis not present

## 2016-01-20 DIAGNOSIS — E118 Type 2 diabetes mellitus with unspecified complications: Secondary | ICD-10-CM | POA: Diagnosis not present

## 2016-01-20 DIAGNOSIS — N2581 Secondary hyperparathyroidism of renal origin: Secondary | ICD-10-CM | POA: Diagnosis not present

## 2016-01-21 ENCOUNTER — Telehealth: Payer: Self-pay | Admitting: Vascular Surgery

## 2016-01-21 NOTE — Telephone Encounter (Signed)
Spoke to Mickel Baas, Therapist, sports at [facility] , Dr. Augustin Coupe saw this patient this past Saturday and thought he was arranging for the patient to have a procedure performed. I called Dr. Aron Baba office and spoke to [place]. She said that Dr. Augustin Coupe was out of the office this week and that she had a message into their charge nurse, Jenny Reichmann. They are supposed to contact patient regarding what Dr. Augustin Coupe had planned for him. According to Mickel Baas, the patient's AVF access has only been used a short time and he still has his catheter.  I told Dr. Aron Baba office to call us if the patient needs our services.

## 2016-01-21 NOTE — Telephone Encounter (Signed)
I tried to contact patient to inform him about all this but could not reach him. Phone just rings with no way to leave a message. Will try to contact him later.

## 2016-01-21 NOTE — Telephone Encounter (Signed)
Mr James [name] walked in today stating that he had an appointment here at 9:00 with Dr Bridgett Larsson. However he isn't scheduled here. I spoke with Mickel Baas, RN at [facility] in [place]- she did not have any indication that he needed to be here.  Mr James [name] is stating that he is having problems with his fistula and needed to see someone about it. He still has his catheter and it can not be removed because he can't use his access in his arm. Please call Mr James [name] at his home #. He will talk to his RN at dialysis as well.  Thank you, Hinton Dyer

## 2016-01-22 DIAGNOSIS — D631 Anemia in chronic kidney disease: Secondary | ICD-10-CM | POA: Diagnosis not present

## 2016-01-22 DIAGNOSIS — E118 Type 2 diabetes mellitus with unspecified complications: Secondary | ICD-10-CM | POA: Diagnosis not present

## 2016-01-22 DIAGNOSIS — N186 End stage renal disease: Secondary | ICD-10-CM | POA: Diagnosis not present

## 2016-01-22 DIAGNOSIS — N2581 Secondary hyperparathyroidism of renal origin: Secondary | ICD-10-CM | POA: Diagnosis not present

## 2016-01-24 DIAGNOSIS — N186 End stage renal disease: Secondary | ICD-10-CM | POA: Diagnosis not present

## 2016-01-24 DIAGNOSIS — E118 Type 2 diabetes mellitus with unspecified complications: Secondary | ICD-10-CM | POA: Diagnosis not present

## 2016-01-24 DIAGNOSIS — D631 Anemia in chronic kidney disease: Secondary | ICD-10-CM | POA: Diagnosis not present

## 2016-01-24 DIAGNOSIS — N2581 Secondary hyperparathyroidism of renal origin: Secondary | ICD-10-CM | POA: Diagnosis not present

## 2016-01-27 DIAGNOSIS — N186 End stage renal disease: Secondary | ICD-10-CM | POA: Diagnosis not present

## 2016-01-27 DIAGNOSIS — N2581 Secondary hyperparathyroidism of renal origin: Secondary | ICD-10-CM | POA: Diagnosis not present

## 2016-01-27 DIAGNOSIS — D631 Anemia in chronic kidney disease: Secondary | ICD-10-CM | POA: Diagnosis not present

## 2016-01-27 DIAGNOSIS — E118 Type 2 diabetes mellitus with unspecified complications: Secondary | ICD-10-CM | POA: Diagnosis not present

## 2016-01-29 DIAGNOSIS — N2581 Secondary hyperparathyroidism of renal origin: Secondary | ICD-10-CM | POA: Diagnosis not present

## 2016-01-29 DIAGNOSIS — D631 Anemia in chronic kidney disease: Secondary | ICD-10-CM | POA: Diagnosis not present

## 2016-01-29 DIAGNOSIS — N186 End stage renal disease: Secondary | ICD-10-CM | POA: Diagnosis not present

## 2016-01-29 DIAGNOSIS — E118 Type 2 diabetes mellitus with unspecified complications: Secondary | ICD-10-CM | POA: Diagnosis not present

## 2016-01-31 DIAGNOSIS — D631 Anemia in chronic kidney disease: Secondary | ICD-10-CM | POA: Diagnosis not present

## 2016-01-31 DIAGNOSIS — N186 End stage renal disease: Secondary | ICD-10-CM | POA: Diagnosis not present

## 2016-01-31 DIAGNOSIS — E118 Type 2 diabetes mellitus with unspecified complications: Secondary | ICD-10-CM | POA: Diagnosis not present

## 2016-01-31 DIAGNOSIS — N2581 Secondary hyperparathyroidism of renal origin: Secondary | ICD-10-CM | POA: Diagnosis not present

## 2016-02-03 DIAGNOSIS — N2581 Secondary hyperparathyroidism of renal origin: Secondary | ICD-10-CM | POA: Diagnosis not present

## 2016-02-03 DIAGNOSIS — E118 Type 2 diabetes mellitus with unspecified complications: Secondary | ICD-10-CM | POA: Diagnosis not present

## 2016-02-03 DIAGNOSIS — Z992 Dependence on renal dialysis: Secondary | ICD-10-CM | POA: Diagnosis not present

## 2016-02-03 DIAGNOSIS — N041 Nephrotic syndrome with focal and segmental glomerular lesions: Secondary | ICD-10-CM | POA: Diagnosis not present

## 2016-02-03 DIAGNOSIS — D631 Anemia in chronic kidney disease: Secondary | ICD-10-CM | POA: Diagnosis not present

## 2016-02-03 DIAGNOSIS — N186 End stage renal disease: Secondary | ICD-10-CM | POA: Diagnosis not present

## 2016-02-05 DIAGNOSIS — D631 Anemia in chronic kidney disease: Secondary | ICD-10-CM | POA: Diagnosis not present

## 2016-02-05 DIAGNOSIS — E118 Type 2 diabetes mellitus with unspecified complications: Secondary | ICD-10-CM | POA: Diagnosis not present

## 2016-02-05 DIAGNOSIS — N186 End stage renal disease: Secondary | ICD-10-CM | POA: Diagnosis not present

## 2016-02-05 DIAGNOSIS — N2581 Secondary hyperparathyroidism of renal origin: Secondary | ICD-10-CM | POA: Diagnosis not present

## 2016-02-07 DIAGNOSIS — N2581 Secondary hyperparathyroidism of renal origin: Secondary | ICD-10-CM | POA: Diagnosis not present

## 2016-02-07 DIAGNOSIS — E118 Type 2 diabetes mellitus with unspecified complications: Secondary | ICD-10-CM | POA: Diagnosis not present

## 2016-02-07 DIAGNOSIS — D631 Anemia in chronic kidney disease: Secondary | ICD-10-CM | POA: Diagnosis not present

## 2016-02-07 DIAGNOSIS — N186 End stage renal disease: Secondary | ICD-10-CM | POA: Diagnosis not present

## 2016-02-09 ENCOUNTER — Encounter: Payer: Self-pay | Admitting: Vascular Surgery

## 2016-02-10 DIAGNOSIS — N186 End stage renal disease: Secondary | ICD-10-CM | POA: Diagnosis not present

## 2016-02-10 DIAGNOSIS — N2581 Secondary hyperparathyroidism of renal origin: Secondary | ICD-10-CM | POA: Diagnosis not present

## 2016-02-10 DIAGNOSIS — D631 Anemia in chronic kidney disease: Secondary | ICD-10-CM | POA: Diagnosis not present

## 2016-02-10 DIAGNOSIS — E118 Type 2 diabetes mellitus with unspecified complications: Secondary | ICD-10-CM | POA: Diagnosis not present

## 2016-02-12 DIAGNOSIS — E118 Type 2 diabetes mellitus with unspecified complications: Secondary | ICD-10-CM | POA: Diagnosis not present

## 2016-02-12 DIAGNOSIS — N186 End stage renal disease: Secondary | ICD-10-CM | POA: Diagnosis not present

## 2016-02-12 DIAGNOSIS — N2581 Secondary hyperparathyroidism of renal origin: Secondary | ICD-10-CM | POA: Diagnosis not present

## 2016-02-12 DIAGNOSIS — D631 Anemia in chronic kidney disease: Secondary | ICD-10-CM | POA: Diagnosis not present

## 2016-02-13 ENCOUNTER — Ambulatory Visit (INDEPENDENT_AMBULATORY_CARE_PROVIDER_SITE_OTHER): Payer: Medicare Other | Admitting: Vascular Surgery

## 2016-02-13 ENCOUNTER — Encounter: Payer: Self-pay | Admitting: Vascular Surgery

## 2016-02-13 ENCOUNTER — Other Ambulatory Visit: Payer: Self-pay

## 2016-02-13 VITALS — BP 105/64 | HR 85 | Temp 98.8°F | Resp 18 | Ht 65.0 in | Wt 206.0 lb

## 2016-02-13 DIAGNOSIS — Z992 Dependence on renal dialysis: Secondary | ICD-10-CM

## 2016-02-13 DIAGNOSIS — N186 End stage renal disease: Secondary | ICD-10-CM | POA: Diagnosis not present

## 2016-02-13 NOTE — Progress Notes (Signed)
Postoperative Access Visit   History of Present Illness  James [name] is a 60 y.o. year old male who presents for postoperative follow-up for: L RC AVF ligation, R BC AVF (Date: 09/15/15).  The patient's wounds are healed.  The patient notes no steal symptoms.  The patient is able to complete their activities of daily living.  The patient's current symptoms are: difficulty with R BC AVF.  Unable to run via R BC AVF.  Currently using TDC.  Past Medical History  Diagnosis Date  . Hypertension   . CHF (congestive heart failure) ([place])   . Diabetes mellitus without complication ([place])     Type II  . Chronic kidney disease     TTHS  . Asthma     as a child    Past Surgical History  Procedure Laterality Date  . Colonoscopy    . Av fistula placement Left 04/08/2015    Procedure: LEFT RADIOCEPHALIC ARTERIOVENOUS (AV) FISTULA CREATION;  Surgeon: Elam Dutch, MD;  Location: [place];  Service: Vascular;  Laterality: Left;  . Peripheral vascular catheterization N/A 07/04/2015    Procedure: Fistulagram;  Surgeon: Elam Dutch, MD;  Location: [place] [facility];  Service: Cardiovascular;  Laterality: N/A;  . Ligation of competing branches of arteriovenous fistula Left 07/16/2015    Procedure: LIGATION OF SIDE BRANCHES OF ARTERIOVENOUS FISTULA LEFT ARM;  Surgeon: Rosetta Posner, MD;  Location: [place];  Service: Vascular;  Laterality: Left;  . Peripheral vascular catheterization N/A 09/08/2015    Procedure: Fistulagram;  Surgeon: Conrad Inwood, MD;  Location: [place] [facility];  Service: Cardiovascular;  Laterality: N/A;  . Ligation of arteriovenous  fistula Left 09/15/2015    Procedure: LIGATION OF RADIOCEPHALIC ARTERIOVENOUS FISTULA - LEFT ARM;  Surgeon: Conrad Appomattox, MD;  Location: [place];  Service: Vascular;  Laterality: Left;  . Av fistula placement Right 09/15/2015    Procedure: RADIOCEPHALIC VERSUS BRACHIOCEPHALIC ARTERIOVENOUS (AV) FISTULA CREATION - RIGHT ARM;  Surgeon: Conrad Wiggins,  MD;  Location: [place];  Service: Vascular;  Laterality: Right;    Social History   Social History  . Marital Status: Single    Spouse Name: N/A  . Number of Children: N/A  . Years of Education: N/A   Occupational History  . Not on file.   Social History Main Topics  . Smoking status: Former Smoker -- 1 years  . Smokeless tobacco: Never Used  . Alcohol Use: No  . Drug Use: No  . Sexual Activity: Not on file   Other Topics Concern  . Not on file   Social History Narrative    History reviewed. No pertinent family history.   Current Outpatient Prescriptions  Medication Sig Dispense Refill  . calcium acetate (PHOSLO) 667 MG capsule Take 2 capsules (1,334 mg total) by mouth 3 (three) times daily with meals. 90 capsule 0  . Darbepoetin Alfa (ARANESP) 100 MCG/0.5ML SOSY injection Inject 0.5 mLs (100 mcg total) into the vein every Thursday with hemodialysis. Will be given at dialysis.    Marland Kitchen gabapentin (NEURONTIN) 300 MG capsule Take 1 capsule (300 mg total) by mouth at bedtime. 30 capsule 0  . insulin detemir (LEVEMIR) 100 UNIT/ML injection Inject 20 Units into the skin 2 (two) times daily.    . Nutritional Supplements (FEEDING SUPPLEMENT, NEPRO CARB STEADY,) LIQD Take 237 mLs by mouth 3 (three) times a week.     . hydrALAZINE (APRESOLINE) 25 MG tablet Take 1 tablet (25  mg total) by mouth 2 (two) times daily. (Patient not taking: Reported on 02/13/2016) 60 tablet 0  . isosorbide mononitrate (IMDUR) 60 MG 24 hr tablet Take 1 tablet (60 mg total) by mouth daily. (Patient not taking: Reported on 02/13/2016) 30 tablet 0  . metoprolol tartrate (LOPRESSOR) 25 MG tablet Take 12.5 mg by mouth daily. Reported on 02/13/2016    . oxyCODONE (ROXICODONE) 5 MG immediate release tablet Take 1 tablet (5 mg total) by mouth every 6 (six) hours as needed. (Patient not taking: Reported on 11/10/2015) 30 tablet 0   No current facility-administered medications for this visit.     No Known  Allergies   REVIEW OF SYSTEMS:  (Positives checked otherwise negative)  CARDIOVASCULAR:   [ ]  chest pain,  [ ]  chest pressure,  [ ]  palpitations,  [ ]  shortness of breath when laying flat,  [ ]  shortness of breath with exertion,   [ ]  pain in feet when walking,  [ ]  pain in feet when laying flat, [ ]  history of blood clot in veins (DVT),  [ ]  history of phlebitis,  [ ]  swelling in legs,  [ ]  varicose veins  PULMONARY:   [ ]  productive cough,  [ ]  asthma,  [ ]  wheezing  NEUROLOGIC:   [ ]  weakness in arms or legs,  [ ]  numbness in arms or legs,  [ ]  difficulty speaking or slurred speech,  [ ]  temporary loss of vision in one eye,  [ ]  dizziness  HEMATOLOGIC:   [ ]  bleeding problems,  [ ]  problems with blood clotting too easily  MUSCULOSKEL:   [ ]  joint pain, [ ]  joint swelling  GASTROINTEST:   [ ]  vomiting blood,  [ ]  blood in stool     GENITOURINARY:   [ ]  burning with urination,  [ ]  blood in urine [x]  ESRD-HD: T/R/S  PSYCHIATRIC:   [ ]  history of major depression  INTEGUMENTARY:   [ ]  rashes,  [ ]  ulcers  CONSTITUTIONAL:   [ ]  fever,  [ ]  chills    For VQI Use Only  PRE-ADM LIVING: Home  AMB STATUS: Ambulatory  Physical Examination Filed Vitals:   02/13/16 1022  BP: 105/64  Pulse: 85  Temp: 98.8 F (37.1 C)  Resp: 18   General: A&O x 3, WD, mildly obese,   Head: [place]/AT  Ear/Nose/Throat: Hearing grossly intact, nares without erythema or drainage, oropharynx without Erythema/Exudate, Mallampati score: 3  Eyes: PERRLA, EOMI  Neck: Supple, no nuchal rigidity, no palpable LAD  Pulmonary: Sym exp, good air movt, CTAB, no rales, rhonchi, & wheezing  Cardiac: RRR, Nl S1, S2, no Murmurs, rubs or gallops  Vascular: Vessel Right Left  Radial Palpable Palpable  Brachial Palpable Palpable  Carotid Palpable, without bruit Palpable, without bruit  Aorta Not palpable N/A  Femoral Palpable Palpable  Popliteal Not palpable Not palpable  PT  Not Palpable Not Palpable  DP Not Palpable Not Palpable   Gastrointestinal: soft, NTND, no G/R, no HSM, no masses, no CVAT B  Musculoskeletal: M/S 5/5 throughout , Extremities without ischemic changes   Neurologic: CN grossly intact , Pain and light touch intact in extremities, Motor exam as listed above  RUE: Incision is healed, skin feels warm, hand grip is 5/5, sensation in digits is intact, palpable thrill distally with drop off in distal 1/3, bruit can be auscultated distal with drop off in distal 1/3   Medical Decision Making  James [name] is a 60 y.o.  year old male who presents s/p R BC AVF with likely venous stenosis, L RC AVF ligation   I recommended: R arm fistulogram, likely intervention.  The patient is scheduled for 20 MAR 17. I discussed with the patient the nature of angiographic procedures, especially the limited patencies of any endovascular intervention.   The patient is aware of that the risks of an angiographic procedure include but are not limited to: bleeding, infection, access site complications, renal failure, embolization, rupture of vessel, dissection, arteriovenous fistula, possible need for emergent surgical intervention, possible need for surgical procedures to treat the patient's pathology, anaphylactic reaction to contrast, and stroke and death.   The patient is aware of the risks and agrees to proceed.  Thank you for allowing [place] to participate in this patient's care.  Adele Barthel, MD [facility] [place] Office: [phone] Pager: [phone]  02/13/2016, 12:37 PM

## 2016-02-14 DIAGNOSIS — N186 End stage renal disease: Secondary | ICD-10-CM | POA: Diagnosis not present

## 2016-02-14 DIAGNOSIS — D631 Anemia in chronic kidney disease: Secondary | ICD-10-CM | POA: Diagnosis not present

## 2016-02-14 DIAGNOSIS — E118 Type 2 diabetes mellitus with unspecified complications: Secondary | ICD-10-CM | POA: Diagnosis not present

## 2016-02-14 DIAGNOSIS — N2581 Secondary hyperparathyroidism of renal origin: Secondary | ICD-10-CM | POA: Diagnosis not present

## 2016-02-17 DIAGNOSIS — D631 Anemia in chronic kidney disease: Secondary | ICD-10-CM | POA: Diagnosis not present

## 2016-02-17 DIAGNOSIS — N186 End stage renal disease: Secondary | ICD-10-CM | POA: Diagnosis not present

## 2016-02-17 DIAGNOSIS — E118 Type 2 diabetes mellitus with unspecified complications: Secondary | ICD-10-CM | POA: Diagnosis not present

## 2016-02-17 DIAGNOSIS — N2581 Secondary hyperparathyroidism of renal origin: Secondary | ICD-10-CM | POA: Diagnosis not present

## 2016-02-19 DIAGNOSIS — E118 Type 2 diabetes mellitus with unspecified complications: Secondary | ICD-10-CM | POA: Diagnosis not present

## 2016-02-19 DIAGNOSIS — N186 End stage renal disease: Secondary | ICD-10-CM | POA: Diagnosis not present

## 2016-02-19 DIAGNOSIS — D631 Anemia in chronic kidney disease: Secondary | ICD-10-CM | POA: Diagnosis not present

## 2016-02-19 DIAGNOSIS — N2581 Secondary hyperparathyroidism of renal origin: Secondary | ICD-10-CM | POA: Diagnosis not present

## 2016-02-21 DIAGNOSIS — D631 Anemia in chronic kidney disease: Secondary | ICD-10-CM | POA: Diagnosis not present

## 2016-02-21 DIAGNOSIS — E118 Type 2 diabetes mellitus with unspecified complications: Secondary | ICD-10-CM | POA: Diagnosis not present

## 2016-02-21 DIAGNOSIS — N2581 Secondary hyperparathyroidism of renal origin: Secondary | ICD-10-CM | POA: Diagnosis not present

## 2016-02-21 DIAGNOSIS — N186 End stage renal disease: Secondary | ICD-10-CM | POA: Diagnosis not present

## 2016-02-23 ENCOUNTER — Encounter (HOSPITAL_COMMUNITY)
Admission: RE | Disposition: A | Discharge status: Home / Self Care | Payer: Self-pay | Source: Ambulatory Visit | Attending: Vascular Surgery

## 2016-02-23 ENCOUNTER — Encounter (HOSPITAL_COMMUNITY): Payer: Self-pay | Admitting: Vascular Surgery

## 2016-02-23 ENCOUNTER — Ambulatory Visit (HOSPITAL_COMMUNITY)
Admission: RE | Admit: 2016-02-23 | Discharge: 2016-02-23 | Disposition: A | Discharge status: Home / Self Care | Payer: Medicare Other | Source: Ambulatory Visit | Attending: Vascular Surgery | Admitting: Vascular Surgery

## 2016-02-23 DIAGNOSIS — I509 Heart failure, unspecified: Secondary | ICD-10-CM | POA: Insufficient documentation

## 2016-02-23 DIAGNOSIS — I132 Hypertensive heart and chronic kidney disease with heart failure and with stage 5 chronic kidney disease, or end stage renal disease: Secondary | ICD-10-CM | POA: Insufficient documentation

## 2016-02-23 DIAGNOSIS — Z992 Dependence on renal dialysis: Secondary | ICD-10-CM | POA: Insufficient documentation

## 2016-02-23 DIAGNOSIS — Z794 Long term (current) use of insulin: Secondary | ICD-10-CM | POA: Insufficient documentation

## 2016-02-23 DIAGNOSIS — N186 End stage renal disease: Secondary | ICD-10-CM | POA: Insufficient documentation

## 2016-02-23 DIAGNOSIS — T82898A Other specified complication of vascular prosthetic devices, implants and grafts, initial encounter: Secondary | ICD-10-CM | POA: Insufficient documentation

## 2016-02-23 DIAGNOSIS — J45909 Unspecified asthma, uncomplicated: Secondary | ICD-10-CM | POA: Diagnosis not present

## 2016-02-23 DIAGNOSIS — E1122 Type 2 diabetes mellitus with diabetic chronic kidney disease: Secondary | ICD-10-CM | POA: Insufficient documentation

## 2016-02-23 DIAGNOSIS — Y832 Surgical operation with anastomosis, bypass or graft as the cause of abnormal reaction of the patient, or of later complication, without mention of misadventure at the time of the procedure: Secondary | ICD-10-CM | POA: Diagnosis not present

## 2016-02-23 DIAGNOSIS — Z87891 Personal history of nicotine dependence: Secondary | ICD-10-CM | POA: Insufficient documentation

## 2016-02-23 HISTORY — PX: PERIPHERAL VASCULAR CATHETERIZATION: SHX172C

## 2016-02-23 LAB — POCT I-STAT, CHEM 8
BUN: 34 mg/dL — AB (ref 6–20)
CALCIUM ION: 1.09 mmol/L — AB (ref 1.12–1.23)
CHLORIDE: 93 mmol/L — AB (ref 101–111)
CREATININE: 7.5 mg/dL — AB (ref 0.61–1.24)
GLUCOSE: 322 mg/dL — AB (ref 65–99)
HCT: 40 % (ref 39.0–52.0)
Hemoglobin: 13.6 g/dL (ref 13.0–17.0)
Potassium: 3.5 mmol/L (ref 3.5–5.1)
SODIUM: 134 mmol/L — AB (ref 135–145)
TCO2: 28 mmol/L (ref 0–100)

## 2016-02-23 LAB — GLUCOSE, CAPILLARY: Glucose-Capillary: 261 mg/dL — ABNORMAL HIGH (ref 65–99)

## 2016-02-23 SURGERY — A/V SHUNTOGRAM/FISTULAGRAM
Laterality: Right

## 2016-02-23 MED ORDER — LIDOCAINE HCL (PF) 1 % IJ SOLN
INTRAMUSCULAR | Status: DC | PRN
  Administered 2016-02-23: 2 mL

## 2016-02-23 MED ORDER — SODIUM CHLORIDE 0.9 % IV SOLN: 1.0000 mL/kg/h | INTRAVENOUS | Status: DC

## 2016-02-23 MED ORDER — IODIXANOL 320 MG/ML IV SOLN
INTRAVENOUS | Status: DC | PRN
  Administered 2016-02-23: 15 mL via INTRAVENOUS

## 2016-02-23 MED ORDER — HEPARIN (PORCINE) IN NACL 2-0.9 UNIT/ML-% IJ SOLN
INTRAMUSCULAR | Status: DC | PRN
  Administered 2016-02-23: 500 mL

## 2016-02-23 MED ORDER — HEPARIN (PORCINE) IN NACL 2-0.9 UNIT/ML-% IJ SOLN
INTRAMUSCULAR | Status: AC
  Filled 2016-02-23: qty 1000

## 2016-02-23 MED ORDER — SODIUM CHLORIDE 0.9% FLUSH: 3.0000 mL | INTRAVENOUS | Status: DC | PRN

## 2016-02-23 MED ORDER — ACETAMINOPHEN 325 MG PO TABS: 650.0000 mg | ORAL_TABLET | ORAL | Status: DC | PRN

## 2016-02-23 MED ORDER — LIDOCAINE HCL (PF) 1 % IJ SOLN
INTRAMUSCULAR | Status: AC
  Filled 2016-02-23: qty 30

## 2016-02-23 SURGICAL SUPPLY — 9 items
BAG SNAP BAND KOVER 36X36 (MISCELLANEOUS) ×3 IMPLANT
COVER DOME SNAP 22 D (MISCELLANEOUS) ×3 IMPLANT
COVER PRB 48X5XTLSCP FOLD TPE (BAG) ×1 IMPLANT
COVER PROBE 5X48 (BAG) ×2
KIT MICROINTRODUCER STIFF 5F (SHEATH) ×6 IMPLANT
STOPCOCK MORSE 400PSI 3WAY (MISCELLANEOUS) ×3 IMPLANT
TRAY PV CATH (CUSTOM PROCEDURE TRAY) ×3 IMPLANT
TUBING CIL FLEX 10 FLL-RA (TUBING) ×3 IMPLANT
WIRE BENTSON .035X145CM (WIRE) ×3 IMPLANT

## 2016-02-23 NOTE — Discharge Instructions (Signed)
Fistulogram, Care After °Refer to this sheet in the next few weeks. These instructions provide you with information on caring for yourself after your procedure. Your health care provider may also give you more specific instructions. Your treatment has been planned according to current medical practices, but problems sometimes occur. Call your health care provider if you have any problems or questions after your procedure. °WHAT TO EXPECT AFTER THE PROCEDURE °After your procedure, it is typical to have the following: °· A small amount of discomfort in the area where the catheters were placed. °· A small amount of bruising around the fistula. °· Sleepiness and fatigue. °HOME CARE INSTRUCTIONS °· Rest at home for the day following your procedure. °· Do not drive or operate heavy machinery while taking pain medicine. °· Take medicines only as directed by your health care provider. °· Do not take baths, swim, or use a hot tub until your health care provider approves. You may shower 24 hours after the procedure or as directed by your health care provider. °· There are many different ways to close and cover an incision, including stitches, skin glue, and adhesive strips. Follow your health care provider's instructions on: °¨ Incision care. °¨ Bandage (dressing) changes and removal. °¨ Incision closure removal. °· Monitor your dialysis fistula carefully. °SEEK MEDICAL CARE IF: °· You have drainage, redness, swelling, or pain at your catheter site. °· You have a fever. °· You have chills. °SEEK IMMEDIATE MEDICAL CARE IF: °· You feel weak. °· You have trouble balancing. °· You have trouble moving your arms or legs. °· You have problems with your speech or vision. °· You can no longer feel a vibration or buzz when you put your fingers over your dialysis fistula. °· The limb that was used for the procedure: °¨ Swells. °¨ Is painful. °¨ Is cold. °¨ Is discolored, such as blue or pale white. °  °This information is not intended  to replace advice given to you by your health care provider. Make sure you discuss any questions you have with your health care provider. °  °Document Released: 04/08/2014 Document Reviewed: 04/08/2014 °Elsevier Interactive Patient Education ©2016 [employer] Inc[employer] ° °

## 2016-02-23 NOTE — H&P (View-Only) (Signed)
Postoperative Access Visit   History of Present Illness  James [name] is a 60 y.o. year old male who presents for postoperative follow-up for: L RC AVF ligation, R BC AVF (Date: 09/15/15).  The patient's wounds are healed.  The patient notes no steal symptoms.  The patient is able to complete their activities of daily living.  The patient's current symptoms are: difficulty with R BC AVF.  Unable to run via R BC AVF.  Currently using TDC.  Past Medical History  Diagnosis Date  . Hypertension   . CHF (congestive heart failure) ([place])   . Diabetes mellitus without complication ([place])     Type II  . Chronic kidney disease     TTHS  . Asthma     as a child    Past Surgical History  Procedure Laterality Date  . Colonoscopy    . Av fistula placement Left 04/08/2015    Procedure: LEFT RADIOCEPHALIC ARTERIOVENOUS (AV) FISTULA CREATION;  Surgeon: Elam Dutch, MD;  Location: [facility];  Service: Vascular;  Laterality: Left;  . Peripheral vascular catheterization N/A 07/04/2015    Procedure: Fistulagram;  Surgeon: Elam Dutch, MD;  Location: [facility];  Service: Cardiovascular;  Laterality: N/A;  . Ligation of competing branches of arteriovenous fistula Left 07/16/2015    Procedure: LIGATION OF SIDE BRANCHES OF ARTERIOVENOUS FISTULA LEFT ARM;  Surgeon: Rosetta Posner, MD;  Location: [facility];  Service: Vascular;  Laterality: Left;  . Peripheral vascular catheterization N/A 09/08/2015    Procedure: Fistulagram;  Surgeon: Conrad Enfield, MD;  Location: [facility];  Service: Cardiovascular;  Laterality: N/A;  . Ligation of arteriovenous  fistula Left 09/15/2015    Procedure: LIGATION OF RADIOCEPHALIC ARTERIOVENOUS FISTULA - LEFT ARM;  Surgeon: Conrad [place], MD;  Location: [facility];  Service: Vascular;  Laterality: Left;  . Av fistula placement Right 09/15/2015    Procedure: RADIOCEPHALIC VERSUS BRACHIOCEPHALIC ARTERIOVENOUS (AV) FISTULA CREATION - RIGHT ARM;  Surgeon: Conrad Sharon,  MD;  Location: [facility];  Service: Vascular;  Laterality: Right;    Social History   Social History  . Marital Status: Single    Spouse Name: N/A  . Number of Children: N/A  . Years of Education: N/A   Occupational History  . Not on file.   Social History Main Topics  . Smoking status: Former Smoker -- 1 years  . Smokeless tobacco: Never Used  . Alcohol Use: No  . Drug Use: No  . Sexual Activity: Not on file   Other Topics Concern  . Not on file   Social History Narrative    History reviewed. No pertinent family history.   Current Outpatient Prescriptions  Medication Sig Dispense Refill  . calcium acetate (PHOSLO) 667 MG capsule Take 2 capsules (1,334 mg total) by mouth 3 (three) times daily with meals. 90 capsule 0  . Darbepoetin Alfa (ARANESP) 100 MCG/0.5ML SOSY injection Inject 0.5 mLs (100 mcg total) into the vein every Thursday with hemodialysis. Will be given at dialysis.    Marland Kitchen gabapentin (NEURONTIN) 300 MG capsule Take 1 capsule (300 mg total) by mouth at bedtime. 30 capsule 0  . insulin detemir (LEVEMIR) 100 UNIT/ML injection Inject 20 Units into the skin 2 (two) times daily.    . Nutritional Supplements (FEEDING SUPPLEMENT, NEPRO CARB STEADY,) LIQD Take 237 mLs by mouth 3 (three) times a week.     . hydrALAZINE (APRESOLINE) 25 MG tablet Take 1 tablet (25  mg total) by mouth 2 (two) times daily. (Patient not taking: Reported on 02/13/2016) 60 tablet 0  . isosorbide mononitrate (IMDUR) 60 MG 24 hr tablet Take 1 tablet (60 mg total) by mouth daily. (Patient not taking: Reported on 02/13/2016) 30 tablet 0  . metoprolol tartrate (LOPRESSOR) 25 MG tablet Take 12.5 mg by mouth daily. Reported on 02/13/2016    . oxyCODONE (ROXICODONE) 5 MG immediate release tablet Take 1 tablet (5 mg total) by mouth every 6 (six) hours as needed. (Patient not taking: Reported on 11/10/2015) 30 tablet 0   No current facility-administered medications for this visit.     No Known  Allergies   REVIEW OF SYSTEMS:  (Positives checked otherwise negative)  CARDIOVASCULAR:   [ ]  chest pain,  [ ]  chest pressure,  [ ]  palpitations,  [ ]  shortness of breath when laying flat,  [ ]  shortness of breath with exertion,   [ ]  pain in feet when walking,  [ ]  pain in feet when laying flat, [ ]  history of blood clot in veins (DVT),  [ ]  history of phlebitis,  [ ]  swelling in legs,  [ ]  varicose veins  PULMONARY:   [ ]  productive cough,  [ ]  asthma,  [ ]  wheezing  NEUROLOGIC:   [ ]  weakness in arms or legs,  [ ]  numbness in arms or legs,  [ ]  difficulty speaking or slurred speech,  [ ]  temporary loss of vision in one eye,  [ ]  dizziness  HEMATOLOGIC:   [ ]  bleeding problems,  [ ]  problems with blood clotting too easily  MUSCULOSKEL:   [ ]  joint pain, [ ]  joint swelling  GASTROINTEST:   [ ]  vomiting blood,  [ ]  blood in stool     GENITOURINARY:   [ ]  burning with urination,  [ ]  blood in urine [x]  ESRD-HD: T/R/S  PSYCHIATRIC:   [ ]  history of major depression  INTEGUMENTARY:   [ ]  rashes,  [ ]  ulcers  CONSTITUTIONAL:   [ ]  fever,  [ ]  chills    For VQI Use Only  PRE-ADM LIVING: Home  AMB STATUS: Ambulatory  Physical Examination Filed Vitals:   02/13/16 1022  BP: 105/64  Pulse: 85  Temp: 98.8 F (37.1 C)  Resp: 18   General: A&O x 3, WD, mildly obese,   Head: [place]/AT  Ear/Nose/Throat: Hearing grossly intact, nares without erythema or drainage, oropharynx without Erythema/Exudate, Mallampati score: 3  Eyes: PERRLA, EOMI  Neck: Supple, no nuchal rigidity, no palpable LAD  Pulmonary: Sym exp, good air movt, CTAB, no rales, rhonchi, & wheezing  Cardiac: RRR, Nl S1, S2, no Murmurs, rubs or gallops  Vascular: Vessel Right Left  Radial Palpable Palpable  Brachial Palpable Palpable  Carotid Palpable, without bruit Palpable, without bruit  Aorta Not palpable N/A  Femoral Palpable Palpable  Popliteal Not palpable Not palpable  PT  Not Palpable Not Palpable  DP Not Palpable Not Palpable   Gastrointestinal: soft, NTND, no G/R, no HSM, no masses, no CVAT B  Musculoskeletal: M/S 5/5 throughout , Extremities without ischemic changes   Neurologic: CN grossly intact , Pain and light touch intact in extremities, Motor exam as listed above  RUE: Incision is healed, skin feels warm, hand grip is 5/5, sensation in digits is intact, palpable thrill distally with drop off in distal 1/3, bruit can be auscultated distal with drop off in distal 1/3   Medical Decision Making  Baxton Wittbrodt is a 60 y.o.  year old male who presents s/p R BC AVF with likely venous stenosis, L RC AVF ligation   I recommended: R arm fistulogram, likely intervention.  The patient is scheduled for 20 MAR 17. I discussed with the patient the nature of angiographic procedures, especially the limited patencies of any endovascular intervention.   The patient is aware of that the risks of an angiographic procedure include but are not limited to: bleeding, infection, access site complications, renal failure, embolization, rupture of vessel, dissection, arteriovenous fistula, possible need for emergent surgical intervention, possible need for surgical procedures to treat the patient's pathology, anaphylactic reaction to contrast, and stroke and death.   The patient is aware of the risks and agrees to proceed.  Thank you for allowing [place] to participate in this patient's care.  Adele Barthel, MD [facility] Office: [phone] Pager: [phone]  02/13/2016, 12:37 PM

## 2016-02-23 NOTE — Interval H&P Note (Signed)
History and Physical Interval Note:  02/23/2016 12:02 PM  James [name]  has presented today for surgery, with the diagnosis of instage renal  The various methods of treatment have been discussed with the patient and family. After consideration of risks, benefits and other options for treatment, the patient has consented to  Procedure(s): Fistulagram (N/A) as a surgical intervention .  The patient's history has been reviewed, patient examined, no change in status, stable for surgery.  I have reviewed the patient's chart and labs.  Questions were answered to the patient's satisfaction.     Adele Barthel

## 2016-02-23 NOTE — Op Note (Signed)
    OPERATIVE NOTE   PROCEDURE: 1. right brachiocephalic arteriovenous cannulation under ultrasound guidance 2. right arm fistulogram  PRE-OPERATIVE DIAGNOSIS: Malfunctioning right brachiocephalic arteriovenous fistula  POST-OPERATIVE DIAGNOSIS: same as above   SURGEON: Adele Barthel, MD  ANESTHESIA: local  ESTIMATED BLOOD LOSS: 5 cc  FINDING(S): 1. Patent mature fistula with compromised cephalic vein confluence onto right subclavian vein   2. Hypertrophied collateral vein draining from cephalic vein into neck circulation 3. Patent axillary vein: incomplete opacification due to valve 4. Right internal jugular vein tunneled dialysis catheter   SPECIMEN(S):  None  CONTRAST: 15 cc  INDICATIONS: James [name] is a 60 y.o. male who  presents with malfunctioning right brachiocephalic arteriovenous fistula.  The patient is scheduled for right arm fistulogram.  The patient is aware the risks include but are not limited to: bleeding, infection, thrombosis of the cannulated access, and possible anaphylactic reaction to the contrast.  The patient is aware of the risks of the procedure and elects to proceed forward.  DESCRIPTION: After full informed written consent was obtained, the patient was brought back to the angiography suite and placed supine upon the angiography table.  The patient was connected to monitoring equipment.  The right upper arm was prepped and draped in the standard fashion for a right arm fistulogram.  Under ultrasound guidance, the right brachiocephalic arteriovenous fistula was cannulated with a micropuncture needle.  The microwire was advanced into the fistula and the needle was exchanged for the a microsheath, which was lodged 2 cm into the access.  The wire was removed and the sheath was connected to the IV extension tubing.  Hand injections were completed to image the access from the antecubitum up to the level of superior vena cava.  The central venous structures were  also imaged by hand injections.  Based on the images, this patient will need: cephalic vein turndown.  A 4-0 Monocryl purse-string suture was sewn around the sheath.  The sheath was removed while tying down the suture.  A sterile bandage was applied to the puncture site.   COMPLICATIONS: none  CONDITION: stable   Adele Barthel, MD [facility] Office: [phone] Pager: [phone]  02/23/2016 2:21 PM

## 2016-02-24 DIAGNOSIS — D631 Anemia in chronic kidney disease: Secondary | ICD-10-CM | POA: Diagnosis not present

## 2016-02-24 DIAGNOSIS — N2581 Secondary hyperparathyroidism of renal origin: Secondary | ICD-10-CM | POA: Diagnosis not present

## 2016-02-24 DIAGNOSIS — N186 End stage renal disease: Secondary | ICD-10-CM | POA: Diagnosis not present

## 2016-02-24 DIAGNOSIS — E118 Type 2 diabetes mellitus with unspecified complications: Secondary | ICD-10-CM | POA: Diagnosis not present

## 2016-02-24 MED FILL — Heparin Sodium (Porcine) 2 Unit/ML in Sodium Chloride 0.9%: INTRAMUSCULAR | Qty: 500 | Status: AC

## 2016-02-26 DIAGNOSIS — N186 End stage renal disease: Secondary | ICD-10-CM | POA: Diagnosis not present

## 2016-02-26 DIAGNOSIS — E118 Type 2 diabetes mellitus with unspecified complications: Secondary | ICD-10-CM | POA: Diagnosis not present

## 2016-02-26 DIAGNOSIS — N2581 Secondary hyperparathyroidism of renal origin: Secondary | ICD-10-CM | POA: Diagnosis not present

## 2016-02-26 DIAGNOSIS — D631 Anemia in chronic kidney disease: Secondary | ICD-10-CM | POA: Diagnosis not present

## 2016-02-27 ENCOUNTER — Other Ambulatory Visit: Payer: Self-pay

## 2016-02-28 DIAGNOSIS — E118 Type 2 diabetes mellitus with unspecified complications: Secondary | ICD-10-CM | POA: Diagnosis not present

## 2016-02-28 DIAGNOSIS — N2581 Secondary hyperparathyroidism of renal origin: Secondary | ICD-10-CM | POA: Diagnosis not present

## 2016-02-28 DIAGNOSIS — N186 End stage renal disease: Secondary | ICD-10-CM | POA: Diagnosis not present

## 2016-02-28 DIAGNOSIS — D631 Anemia in chronic kidney disease: Secondary | ICD-10-CM | POA: Diagnosis not present

## 2016-03-02 DIAGNOSIS — E118 Type 2 diabetes mellitus with unspecified complications: Secondary | ICD-10-CM | POA: Diagnosis not present

## 2016-03-02 DIAGNOSIS — N186 End stage renal disease: Secondary | ICD-10-CM | POA: Diagnosis not present

## 2016-03-02 DIAGNOSIS — N2581 Secondary hyperparathyroidism of renal origin: Secondary | ICD-10-CM | POA: Diagnosis not present

## 2016-03-02 DIAGNOSIS — D631 Anemia in chronic kidney disease: Secondary | ICD-10-CM | POA: Diagnosis not present

## 2016-03-04 DIAGNOSIS — N2581 Secondary hyperparathyroidism of renal origin: Secondary | ICD-10-CM | POA: Diagnosis not present

## 2016-03-04 DIAGNOSIS — N186 End stage renal disease: Secondary | ICD-10-CM | POA: Diagnosis not present

## 2016-03-04 DIAGNOSIS — D631 Anemia in chronic kidney disease: Secondary | ICD-10-CM | POA: Diagnosis not present

## 2016-03-04 DIAGNOSIS — E118 Type 2 diabetes mellitus with unspecified complications: Secondary | ICD-10-CM | POA: Diagnosis not present

## 2016-03-05 DIAGNOSIS — N186 End stage renal disease: Secondary | ICD-10-CM | POA: Diagnosis not present

## 2016-03-05 DIAGNOSIS — N041 Nephrotic syndrome with focal and segmental glomerular lesions: Secondary | ICD-10-CM | POA: Diagnosis not present

## 2016-03-05 DIAGNOSIS — Z992 Dependence on renal dialysis: Secondary | ICD-10-CM | POA: Diagnosis not present

## 2016-03-06 DIAGNOSIS — E118 Type 2 diabetes mellitus with unspecified complications: Secondary | ICD-10-CM | POA: Diagnosis not present

## 2016-03-06 DIAGNOSIS — N2581 Secondary hyperparathyroidism of renal origin: Secondary | ICD-10-CM | POA: Diagnosis not present

## 2016-03-06 DIAGNOSIS — D509 Iron deficiency anemia, unspecified: Secondary | ICD-10-CM | POA: Diagnosis not present

## 2016-03-06 DIAGNOSIS — N186 End stage renal disease: Secondary | ICD-10-CM | POA: Diagnosis not present

## 2016-03-06 DIAGNOSIS — D631 Anemia in chronic kidney disease: Secondary | ICD-10-CM | POA: Diagnosis not present

## 2016-03-09 DIAGNOSIS — D631 Anemia in chronic kidney disease: Secondary | ICD-10-CM | POA: Diagnosis not present

## 2016-03-09 DIAGNOSIS — N186 End stage renal disease: Secondary | ICD-10-CM | POA: Diagnosis not present

## 2016-03-09 DIAGNOSIS — N2581 Secondary hyperparathyroidism of renal origin: Secondary | ICD-10-CM | POA: Diagnosis not present

## 2016-03-09 DIAGNOSIS — D509 Iron deficiency anemia, unspecified: Secondary | ICD-10-CM | POA: Diagnosis not present

## 2016-03-09 DIAGNOSIS — E118 Type 2 diabetes mellitus with unspecified complications: Secondary | ICD-10-CM | POA: Diagnosis not present

## 2016-03-09 MED ORDER — DEXTROSE 5 % IV SOLN
1.5000 g | INTRAVENOUS | Status: AC
  Administered 2016-03-10: 1.5 g via INTRAVENOUS
  Filled 2016-03-09: qty 1.5

## 2016-03-09 MED ORDER — SODIUM CHLORIDE 0.9 % IV SOLN
INTRAVENOUS | Status: DC
Start: 2016-03-10 — End: 2016-03-10
  Administered 2016-03-10 (×2): via INTRAVENOUS

## 2016-03-09 NOTE — Progress Notes (Signed)
Have tried numerous times to contact pt. The number just rings, no answer and no voicemail available. I spoke with pt's mother yesterday and she was to give my number to him to call me back. He never did. I called his mother again this afternoon and she stated that she gave him my number, but he told her the office had already given him instructions. I asked Mrs. Judeth Horn if I could give her the instructions to give him when she sees him. She agreed. Pre-op instructions given according the Pre-op Call Checklist. She voiced understanding.

## 2016-03-10 ENCOUNTER — Ambulatory Visit (HOSPITAL_COMMUNITY): Payer: Medicare Other | Admitting: Certified Registered Nurse Anesthetist

## 2016-03-10 ENCOUNTER — Encounter (HOSPITAL_COMMUNITY)
Admission: RE | Disposition: A | Discharge status: Home / Self Care | Payer: Self-pay | Source: Ambulatory Visit | Attending: Vascular Surgery

## 2016-03-10 ENCOUNTER — Ambulatory Visit (HOSPITAL_COMMUNITY)
Admission: RE | Admit: 2016-03-10 | Discharge: 2016-03-10 | Disposition: A | Discharge status: Home / Self Care | Payer: Medicare Other | Source: Ambulatory Visit | Attending: Vascular Surgery | Admitting: Vascular Surgery

## 2016-03-10 DIAGNOSIS — I82711 Chronic embolism and thrombosis of superficial veins of right upper extremity: Secondary | ICD-10-CM | POA: Insufficient documentation

## 2016-03-10 DIAGNOSIS — Z992 Dependence on renal dialysis: Secondary | ICD-10-CM | POA: Insufficient documentation

## 2016-03-10 DIAGNOSIS — Z87891 Personal history of nicotine dependence: Secondary | ICD-10-CM | POA: Diagnosis not present

## 2016-03-10 DIAGNOSIS — I509 Heart failure, unspecified: Secondary | ICD-10-CM | POA: Diagnosis not present

## 2016-03-10 DIAGNOSIS — Z79899 Other long term (current) drug therapy: Secondary | ICD-10-CM | POA: Diagnosis not present

## 2016-03-10 DIAGNOSIS — E1122 Type 2 diabetes mellitus with diabetic chronic kidney disease: Secondary | ICD-10-CM | POA: Diagnosis not present

## 2016-03-10 DIAGNOSIS — Z794 Long term (current) use of insulin: Secondary | ICD-10-CM | POA: Insufficient documentation

## 2016-03-10 DIAGNOSIS — T82591A Other mechanical complication of surgically created arteriovenous shunt, initial encounter: Secondary | ICD-10-CM | POA: Insufficient documentation

## 2016-03-10 DIAGNOSIS — I132 Hypertensive heart and chronic kidney disease with heart failure and with stage 5 chronic kidney disease, or end stage renal disease: Secondary | ICD-10-CM | POA: Insufficient documentation

## 2016-03-10 DIAGNOSIS — T82898A Other specified complication of vascular prosthetic devices, implants and grafts, initial encounter: Secondary | ICD-10-CM | POA: Diagnosis not present

## 2016-03-10 DIAGNOSIS — Y832 Surgical operation with anastomosis, bypass or graft as the cause of abnormal reaction of the patient, or of later complication, without mention of misadventure at the time of the procedure: Secondary | ICD-10-CM | POA: Diagnosis not present

## 2016-03-10 DIAGNOSIS — E669 Obesity, unspecified: Secondary | ICD-10-CM | POA: Diagnosis not present

## 2016-03-10 DIAGNOSIS — N186 End stage renal disease: Secondary | ICD-10-CM | POA: Diagnosis not present

## 2016-03-10 HISTORY — PX: REVISON OF ARTERIOVENOUS FISTULA: SHX6074

## 2016-03-10 LAB — POCT I-STAT 4, (NA,K, GLUC, HGB,HCT)
GLUCOSE: 230 mg/dL — AB (ref 65–99)
HCT: 40 % (ref 39.0–52.0)
Hemoglobin: 13.6 g/dL (ref 13.0–17.0)
Potassium: 3.2 mmol/L — ABNORMAL LOW (ref 3.5–5.1)
Sodium: 134 mmol/L — ABNORMAL LOW (ref 135–145)

## 2016-03-10 LAB — GLUCOSE, CAPILLARY: GLUCOSE-CAPILLARY: 223 mg/dL — AB (ref 65–99)

## 2016-03-10 SURGERY — REVISON OF ARTERIOVENOUS FISTULA
Anesthesia: General | Site: Arm Upper | Laterality: Right

## 2016-03-10 MED ORDER — GLYCOPYRROLATE 0.2 MG/ML IJ SOLN
INTRAMUSCULAR | Status: AC
  Filled 2016-03-10: qty 1

## 2016-03-10 MED ORDER — FENTANYL CITRATE (PF) 250 MCG/5ML IJ SOLN
INTRAMUSCULAR | Status: DC | PRN
  Administered 2016-03-10: 25 ug via INTRAVENOUS
  Administered 2016-03-10: 50 ug via INTRAVENOUS

## 2016-03-10 MED ORDER — LIDOCAINE HCL (CARDIAC) 20 MG/ML IV SOLN
INTRAVENOUS | Status: AC
  Filled 2016-03-10: qty 5

## 2016-03-10 MED ORDER — MIDAZOLAM HCL 2 MG/2ML IJ SOLN
INTRAMUSCULAR | Status: AC
  Filled 2016-03-10: qty 2

## 2016-03-10 MED ORDER — PROTAMINE SULFATE 10 MG/ML IV SOLN
INTRAVENOUS | Status: AC
  Filled 2016-03-10: qty 25

## 2016-03-10 MED ORDER — OXYCODONE HCL 5 MG PO TABS: 5.0000 mg | ORAL_TABLET | Freq: Four times a day (QID) | ORAL | Status: DC | PRN

## 2016-03-10 MED ORDER — ONDANSETRON HCL 4 MG/2ML IJ SOLN
INTRAMUSCULAR | Status: DC | PRN
  Administered 2016-03-10: 4 mg via INTRAVENOUS

## 2016-03-10 MED ORDER — EPHEDRINE SULFATE 50 MG/ML IJ SOLN
INTRAMUSCULAR | Status: DC | PRN
Start: 2016-03-10 — End: 2016-03-10
  Administered 2016-03-10: 10 mg via INTRAVENOUS
  Administered 2016-03-10 (×2): 5 mg via INTRAVENOUS

## 2016-03-10 MED ORDER — FENTANYL CITRATE (PF) 100 MCG/2ML IJ SOLN: 25.0000 ug | INTRAMUSCULAR | Status: DC | PRN

## 2016-03-10 MED ORDER — HEPARIN SODIUM (PORCINE) 1000 UNIT/ML IJ SOLN
INTRAMUSCULAR | Status: AC
  Filled 2016-03-10: qty 1

## 2016-03-10 MED ORDER — PHENYLEPHRINE HCL 10 MG/ML IJ SOLN
INTRAMUSCULAR | Status: DC | PRN
  Administered 2016-03-10: 80 ug via INTRAVENOUS
  Administered 2016-03-10 (×2): 120 ug via INTRAVENOUS
  Administered 2016-03-10: 80 ug via INTRAVENOUS

## 2016-03-10 MED ORDER — MIDAZOLAM HCL 2 MG/2ML IJ SOLN
INTRAMUSCULAR | Status: DC | PRN
  Administered 2016-03-10: 2 mg via INTRAVENOUS

## 2016-03-10 MED ORDER — CHLORHEXIDINE GLUCONATE CLOTH 2 % EX PADS: 6.0000 | MEDICATED_PAD | Freq: Once | CUTANEOUS | Status: DC

## 2016-03-10 MED ORDER — GLYCOPYRROLATE 0.2 MG/ML IJ SOLN
INTRAMUSCULAR | Status: DC | PRN
  Administered 2016-03-10: 0.2 mg via INTRAVENOUS

## 2016-03-10 MED ORDER — ARTIFICIAL TEARS OP OINT
TOPICAL_OINTMENT | OPHTHALMIC | Status: AC
  Filled 2016-03-10: qty 3.5

## 2016-03-10 MED ORDER — LIDOCAINE HCL (CARDIAC) 20 MG/ML IV SOLN
INTRAVENOUS | Status: DC | PRN
  Administered 2016-03-10: 100 mg via INTRATRACHEAL

## 2016-03-10 MED ORDER — EPHEDRINE SULFATE 50 MG/ML IJ SOLN
INTRAMUSCULAR | Status: AC
  Filled 2016-03-10: qty 1

## 2016-03-10 MED ORDER — HEPARIN SODIUM (PORCINE) 5000 UNIT/ML IJ SOLN
INTRAMUSCULAR | Status: DC | PRN
  Administered 2016-03-10: 12:00:00

## 2016-03-10 MED ORDER — PHENYLEPHRINE 40 MCG/ML (10ML) SYRINGE FOR IV PUSH (FOR BLOOD PRESSURE SUPPORT)
PREFILLED_SYRINGE | INTRAVENOUS | Status: AC
  Filled 2016-03-10: qty 10

## 2016-03-10 MED ORDER — FENTANYL CITRATE (PF) 250 MCG/5ML IJ SOLN
INTRAMUSCULAR | Status: AC
  Filled 2016-03-10: qty 5

## 2016-03-10 MED ORDER — ONDANSETRON HCL 4 MG/2ML IJ SOLN
INTRAMUSCULAR | Status: AC
  Filled 2016-03-10: qty 2

## 2016-03-10 MED ORDER — PROPOFOL 10 MG/ML IV BOLUS
INTRAVENOUS | Status: AC
  Filled 2016-03-10: qty 20

## 2016-03-10 MED ORDER — 0.9 % SODIUM CHLORIDE (POUR BTL) OPTIME
TOPICAL | Status: DC | PRN
  Administered 2016-03-10: 1000 mL

## 2016-03-10 MED ORDER — HEPARIN SODIUM (PORCINE) 1000 UNIT/ML IJ SOLN
INTRAMUSCULAR | Status: DC | PRN
  Administered 2016-03-10: 5000 [IU] via INTRAVENOUS

## 2016-03-10 MED ORDER — PROTAMINE SULFATE 10 MG/ML IV SOLN
INTRAVENOUS | Status: AC
  Filled 2016-03-10: qty 5

## 2016-03-10 MED ORDER — PROTAMINE SULFATE 10 MG/ML IV SOLN
INTRAVENOUS | Status: DC | PRN
  Administered 2016-03-10: 30 mg via INTRAVENOUS

## 2016-03-10 MED ORDER — MEPERIDINE HCL 25 MG/ML IJ SOLN: 6.2500 mg | INTRAMUSCULAR | Status: DC | PRN

## 2016-03-10 MED ORDER — PROPOFOL 10 MG/ML IV BOLUS
INTRAVENOUS | Status: DC | PRN
  Administered 2016-03-10: 200 mg via INTRAVENOUS

## 2016-03-10 SURGICAL SUPPLY — 40 items
CANISTER SUCTION 2500CC (MISCELLANEOUS) ×3 IMPLANT
CLIP TI MEDIUM 6 (CLIP) ×3 IMPLANT
CLIP TI WIDE RED SMALL 6 (CLIP) ×3 IMPLANT
COVER PROBE W GEL 5X96 (DRAPES) ×3 IMPLANT
DECANTER SPIKE VIAL GLASS SM (MISCELLANEOUS) ×3 IMPLANT
DRAIN PENROSE 1/2X12 LTX STRL (WOUND CARE) IMPLANT
ELECT REM PT RETURN 9FT ADLT (ELECTROSURGICAL) ×3
ELECTRODE REM PT RTRN 9FT ADLT (ELECTROSURGICAL) ×1 IMPLANT
GLOVE BIO SURGEON STRL SZ 6.5 (GLOVE) ×6 IMPLANT
GLOVE BIO SURGEON STRL SZ7 (GLOVE) ×3 IMPLANT
GLOVE BIO SURGEONS STRL SZ 6.5 (GLOVE) ×3
GLOVE BIOGEL PI IND STRL 6.5 (GLOVE) ×1 IMPLANT
GLOVE BIOGEL PI IND STRL 7.5 (GLOVE) ×1 IMPLANT
GLOVE BIOGEL PI IND STRL 8 (GLOVE) ×1 IMPLANT
GLOVE BIOGEL PI INDICATOR 6.5 (GLOVE) ×2
GLOVE BIOGEL PI INDICATOR 7.5 (GLOVE) ×2
GLOVE BIOGEL PI INDICATOR 8 (GLOVE) ×2
GLOVE ECLIPSE 7.5 STRL STRAW (GLOVE) ×6 IMPLANT
GLOVE SURG SS PI 6.0 STRL IVOR (GLOVE) ×3 IMPLANT
GOWN STRL REUS W/ TWL LRG LVL3 (GOWN DISPOSABLE) ×3 IMPLANT
GOWN STRL REUS W/TWL LRG LVL3 (GOWN DISPOSABLE) ×6
GOWN STRL REUS W/TWL XL LVL3 (GOWN DISPOSABLE) ×6 IMPLANT
KIT BASIN OR (CUSTOM PROCEDURE TRAY) ×3 IMPLANT
KIT ROOM TURNOVER OR (KITS) ×3 IMPLANT
LIQUID BAND (GAUZE/BANDAGES/DRESSINGS) ×3 IMPLANT
NS IRRIG 1000ML POUR BTL (IV SOLUTION) ×3 IMPLANT
PACK CV ACCESS (CUSTOM PROCEDURE TRAY) ×3 IMPLANT
PAD ARMBOARD 7.5X6 YLW CONV (MISCELLANEOUS) ×6 IMPLANT
SPONGE GAUZE 4X4 12PLY STER LF (GAUZE/BANDAGES/DRESSINGS) ×3 IMPLANT
SPONGE SURGIFOAM ABS GEL 100 (HEMOSTASIS) IMPLANT
SUT MNCRL AB 4-0 PS2 18 (SUTURE) ×6 IMPLANT
SUT PROLENE 6 0 BV (SUTURE) ×6 IMPLANT
SUT PROLENE 7 0 BV 1 (SUTURE) ×6 IMPLANT
SUT VIC AB 2-0 CT1 27 (SUTURE) ×4
SUT VIC AB 2-0 CT1 TAPERPNT 27 (SUTURE) ×2 IMPLANT
SUT VIC AB 3-0 SH 27 (SUTURE) ×4
SUT VIC AB 3-0 SH 27X BRD (SUTURE) ×2 IMPLANT
TAPE CLOTH SURG 4X10 WHT LF (GAUZE/BANDAGES/DRESSINGS) ×3 IMPLANT
UNDERPAD 30X30 INCONTINENT (UNDERPADS AND DIAPERS) ×3 IMPLANT
WATER STERILE IRR 1000ML POUR (IV SOLUTION) ×3 IMPLANT

## 2016-03-10 NOTE — H&P (View-Only) (Signed)
Postoperative Access Visit   History of Present Illness  James [name] is a 60 y.o. year old male who presents for postoperative follow-up for: L RC AVF ligation, R BC AVF (Date: 09/15/15).  The patient's wounds are healed.  The patient notes no steal symptoms.  The patient is able to complete their activities of daily living.  The patient's current symptoms are: difficulty with R BC AVF.  Unable to run via R BC AVF.  Currently using TDC.  Past Medical History  Diagnosis Date  . Hypertension   . CHF (congestive heart failure) ([place])   . Diabetes mellitus without complication ([place])     Type II  . Chronic kidney disease     TTHS  . Asthma     as a child    Past Surgical History  Procedure Laterality Date  . Colonoscopy    . Av fistula placement Left 04/08/2015    Procedure: LEFT RADIOCEPHALIC ARTERIOVENOUS (AV) FISTULA CREATION;  Surgeon: Elam Dutch, MD;  Location: [facility];  Service: Vascular;  Laterality: Left;  . Peripheral vascular catheterization N/A 07/04/2015    Procedure: Fistulagram;  Surgeon: Elam Dutch, MD;  Location: [facility];  Service: Cardiovascular;  Laterality: N/A;  . Ligation of competing branches of arteriovenous fistula Left 07/16/2015    Procedure: LIGATION OF SIDE BRANCHES OF ARTERIOVENOUS FISTULA LEFT ARM;  Surgeon: Rosetta Posner, MD;  Location: [facility];  Service: Vascular;  Laterality: Left;  . Peripheral vascular catheterization N/A 09/08/2015    Procedure: Fistulagram;  Surgeon: Conrad [place], MD;  Location: [facility];  Service: Cardiovascular;  Laterality: N/A;  . Ligation of arteriovenous  fistula Left 09/15/2015    Procedure: LIGATION OF RADIOCEPHALIC ARTERIOVENOUS FISTULA - LEFT ARM;  Surgeon: Conrad Marshall, MD;  Location: [facility];  Service: Vascular;  Laterality: Left;  . Av fistula placement Right 09/15/2015    Procedure: RADIOCEPHALIC VERSUS BRACHIOCEPHALIC ARTERIOVENOUS (AV) FISTULA CREATION - RIGHT ARM;  Surgeon: Conrad Palo Pinto,  MD;  Location: [facility];  Service: Vascular;  Laterality: Right;    Social History   Social History  . Marital Status: Single    Spouse Name: N/A  . Number of Children: N/A  . Years of Education: N/A   Occupational History  . Not on file.   Social History Main Topics  . Smoking status: Former Smoker -- 1 years  . Smokeless tobacco: Never Used  . Alcohol Use: No  . Drug Use: No  . Sexual Activity: Not on file   Other Topics Concern  . Not on file   Social History Narrative    History reviewed. No pertinent family history.   Current Outpatient Prescriptions  Medication Sig Dispense Refill  . calcium acetate (PHOSLO) 667 MG capsule Take 2 capsules (1,334 mg total) by mouth 3 (three) times daily with meals. 90 capsule 0  . Darbepoetin Alfa (ARANESP) 100 MCG/0.5ML SOSY injection Inject 0.5 mLs (100 mcg total) into the vein every Thursday with hemodialysis. Will be given at dialysis.    Marland Kitchen gabapentin (NEURONTIN) 300 MG capsule Take 1 capsule (300 mg total) by mouth at bedtime. 30 capsule 0  . insulin detemir (LEVEMIR) 100 UNIT/ML injection Inject 20 Units into the skin 2 (two) times daily.    . Nutritional Supplements (FEEDING SUPPLEMENT, NEPRO CARB STEADY,) LIQD Take 237 mLs by mouth 3 (three) times a week.     . hydrALAZINE (APRESOLINE) 25 MG tablet Take 1 tablet (25  mg total) by mouth 2 (two) times daily. (Patient not taking: Reported on 02/13/2016) 60 tablet 0  . isosorbide mononitrate (IMDUR) 60 MG 24 hr tablet Take 1 tablet (60 mg total) by mouth daily. (Patient not taking: Reported on 02/13/2016) 30 tablet 0  . metoprolol tartrate (LOPRESSOR) 25 MG tablet Take 12.5 mg by mouth daily. Reported on 02/13/2016    . oxyCODONE (ROXICODONE) 5 MG immediate release tablet Take 1 tablet (5 mg total) by mouth every 6 (six) hours as needed. (Patient not taking: Reported on 11/10/2015) 30 tablet 0   No current facility-administered medications for this visit.     No Known  Allergies   REVIEW OF SYSTEMS:  (Positives checked otherwise negative)  CARDIOVASCULAR:   [ ]  chest pain,  [ ]  chest pressure,  [ ]  palpitations,  [ ]  shortness of breath when laying flat,  [ ]  shortness of breath with exertion,   [ ]  pain in feet when walking,  [ ]  pain in feet when laying flat, [ ]  history of blood clot in veins (DVT),  [ ]  history of phlebitis,  [ ]  swelling in legs,  [ ]  varicose veins  PULMONARY:   [ ]  productive cough,  [ ]  asthma,  [ ]  wheezing  NEUROLOGIC:   [ ]  weakness in arms or legs,  [ ]  numbness in arms or legs,  [ ]  difficulty speaking or slurred speech,  [ ]  temporary loss of vision in one eye,  [ ]  dizziness  HEMATOLOGIC:   [ ]  bleeding problems,  [ ]  problems with blood clotting too easily  MUSCULOSKEL:   [ ]  joint pain, [ ]  joint swelling  GASTROINTEST:   [ ]  vomiting blood,  [ ]  blood in stool     GENITOURINARY:   [ ]  burning with urination,  [ ]  blood in urine [x]  ESRD-HD: T/R/S  PSYCHIATRIC:   [ ]  history of major depression  INTEGUMENTARY:   [ ]  rashes,  [ ]  ulcers  CONSTITUTIONAL:   [ ]  fever,  [ ]  chills    For VQI Use Only  PRE-ADM LIVING: Home  AMB STATUS: Ambulatory  Physical Examination Filed Vitals:   02/13/16 1022  BP: 105/64  Pulse: 85  Temp: 98.8 F (37.1 C)  Resp: 18   General: A&O x 3, WD, mildly obese,   Head: [place]/AT  Ear/Nose/Throat: Hearing grossly intact, nares without erythema or drainage, oropharynx without Erythema/Exudate, Mallampati score: 3  Eyes: PERRLA, EOMI  Neck: Supple, no nuchal rigidity, no palpable LAD  Pulmonary: Sym exp, good air movt, CTAB, no rales, rhonchi, & wheezing  Cardiac: RRR, Nl S1, S2, no Murmurs, rubs or gallops  Vascular: Vessel Right Left  Radial Palpable Palpable  Brachial Palpable Palpable  Carotid Palpable, without bruit Palpable, without bruit  Aorta Not palpable N/A  Femoral Palpable Palpable  Popliteal Not palpable Not palpable  PT  Not Palpable Not Palpable  DP Not Palpable Not Palpable   Gastrointestinal: soft, NTND, no G/R, no HSM, no masses, no CVAT B  Musculoskeletal: M/S 5/5 throughout , Extremities without ischemic changes   Neurologic: CN grossly intact , Pain and light touch intact in extremities, Motor exam as listed above  RUE: Incision is healed, skin feels warm, hand grip is 5/5, sensation in digits is intact, palpable thrill distally with drop off in distal 1/3, bruit can be auscultated distal with drop off in distal 1/3   Medical Decision Making  James [name] is a 60 y.o.  year old male who presents s/p R BC AVF with likely venous stenosis, L RC AVF ligation   I recommended: R arm fistulogram, likely intervention.  The patient is scheduled for 20 MAR 17. I discussed with the patient the nature of angiographic procedures, especially the limited patencies of any endovascular intervention.   The patient is aware of that the risks of an angiographic procedure include but are not limited to: bleeding, infection, access site complications, renal failure, embolization, rupture of vessel, dissection, arteriovenous fistula, possible need for emergent surgical intervention, possible need for surgical procedures to treat the patient's pathology, anaphylactic reaction to contrast, and stroke and death.   The patient is aware of the risks and agrees to proceed.  Thank you for allowing [place] to participate in this patient's care.  Adele Barthel, MD [facility] Office: [phone] Pager: [phone]  02/13/2016, 12:37 PM

## 2016-03-10 NOTE — Anesthesia Procedure Notes (Signed)
Procedure Name: LMA Insertion Date/Time: 03/10/2016 12:33 PM Performed by: Collier Bullock Pre-anesthesia Checklist: Patient identified, Emergency Drugs available, Suction available and Patient being monitored Patient Re-evaluated:Patient Re-evaluated prior to inductionOxygen Delivery Method: Circle system utilized Preoxygenation: Pre-oxygenation with 100% oxygen Intubation Type: IV induction Ventilation: Mask ventilation without difficulty LMA: LMA inserted LMA Size: 4.0 Number of attempts: 1 Placement Confirmation: breath sounds checked- equal and bilateral and positive ETCO2 Tube secured with: Tape Dental Injury: Teeth and Oropharynx as per pre-operative assessment

## 2016-03-10 NOTE — Discharge Instructions (Signed)
03/10/2016 James [name] [id number] 09/24/1956  Surgeon(s): Fransisco Hertz, MD  Procedure(s): RIGHT CEPHALIC VEIN TURNDOWN  x Do not stick fistula for 4 weeks

## 2016-03-10 NOTE — Anesthesia Preprocedure Evaluation (Addendum)
Anesthesia Evaluation  Patient identified by MRN, date of birth, ID band Patient awake    Reviewed: Allergy & Precautions, NPO status , Patient's Chart, lab work & pertinent test results  History of Anesthesia Complications Negative for: history of anesthetic complications  Airway Mallampati: II       Dental  (+) Edentulous Upper, Edentulous Lower   Pulmonary former smoker,    Pulmonary exam normal breath sounds clear to auscultation       Cardiovascular hypertension, Pt. on medications +CHF   Rhythm:Regular     Neuro/Psych negative neurological ROS  negative psych ROS   GI/Hepatic   Endo/Other  diabetes, Type 2  Renal/GU ESRF and DialysisRenal disease     Musculoskeletal   Abdominal (+) + obese,  Abdomen: soft.    Peds  Hematology   Anesthesia Other Findings   Reproductive/Obstetrics                             Anesthesia Physical  Anesthesia Plan  ASA: IV  Anesthesia Plan: General   Post-op Pain Management:    Induction: Intravenous  Airway Management Planned: LMA  Additional Equipment:   Intra-op Plan:   Post-operative Plan: Extubation in OR  Informed Consent: I have reviewed the patients History and Physical, chart, labs and discussed the procedure including the risks, benefits and alternatives for the proposed anesthesia with the patient or authorized representative who has indicated his/her understanding and acceptance.   Dental advisory given  Plan Discussed with: CRNA  Anesthesia Plan Comments:        Anesthesia Quick Evaluation

## 2016-03-10 NOTE — Anesthesia Postprocedure Evaluation (Signed)
Anesthesia Post Note  Patient: James [name]  Procedure(s) Performed: Procedure(s) (LRB): RIGHT CEPHALIC VEIN TURNDOWN (Right)  Patient location during evaluation: PACU Anesthesia Type: General Level of consciousness: sedated and patient cooperative Pain management: pain level controlled Vital Signs Assessment: post-procedure vital signs reviewed and stable Respiratory status: spontaneous breathing Cardiovascular status: stable Anesthetic complications: no    Last Vitals:  Filed Vitals:   03/10/16 1440 03/10/16 1454  BP: 149/87 127/77  Pulse: 91 93  Temp:    Resp: 16 14    Last Pain: There were no vitals filed for this visit.               Nolon Nations

## 2016-03-10 NOTE — Interval H&P Note (Signed)
[facility]  History and Physical Update  The patient was interviewed and re-examined.  The patient's previous History and Physical has been reviewed and is unchanged from my consult except for: interval fistulogram.  Based on the fistulogram, I recommended: R cephalic vein turndown.     Risk, benefits, and alternatives to access surgery were discussed.    The patient is aware the risks include but are not limited to: bleeding, infection, steal syndrome, nerve damage, ischemic monomelic neuropathy, failure to mature, need for additional procedures, death and stroke.    The patient agrees to proceed forward with the procedure.  Adele Barthel, MD [facility] Office: [phone] Pager: [phone]  03/10/2016, 11:42 AM

## 2016-03-10 NOTE — Op Note (Signed)
OPERATIVE NOTE   PROCEDURE: 1. Right cephalic vein turndown  PRE-OPERATIVE DIAGNOSIS: end stage renal disease, central cephalic vein occlusion  POST-OPERATIVE DIAGNOSIS: same as above   SURGEON: Adele Barthel, MD  ASSISTANT(S): Leontine Locket, PAC   ANESTHESIA: general  ESTIMATED BLOOD LOSS: 100 cc  FINDING(S): 1.  Sclerotic capsule around cephalic vein 2.  Vein >6 mm throughout 3.  High brachial vein 4-6 mm 4.  Improved thrill in fistula at end of case  SPECIMEN(S):  none  INDICATIONS:   James [name] is a 60 y.o. male who presents with end stage renal disease and poor functioning right brachiocephalic arteriovenous fistula.  His fistulogram demonstrated: essential functional occlusion of proximal cephalic vein with hypertrophied collateral into neck.  I recommended right cephalic vein turndown to try to salvage this fistula.     Risk, benefits, and alternatives to access surgery were discussed.    The patient is aware the risks include but are not limited to: bleeding, infection, steal syndrome, nerve damage, ischemic monomelic neuropathy, failure to mature, need for additional procedures, death and stroke.    The patient agrees to proceed forward with the procedure.  DESCRIPTION: After obtaining full informed written consent, the patient was brought back to the operating room and placed supine upon the operating table.  The patient received IV antibiotics prior to induction.  After obtaining adequate anesthesia, the patient was prepped and draped in the standard fashion for: right access procedure.  I turned my attention to the right upper arm.  Under [employer] guidance, I marked out the proximal cephalic vein near the shoulder and the high brachial vein.  I made a longitudinal incision in the axilla over the high brachial vein.  I dissected down to the vein with electrocautery.  I placed a vessel loop around the vein.  It appeared to be soft and compressible: 4-6 mm in  diameter.    I then turned my attention to the shoulder region.  I made an incision over the cephalic vein.  I dissected out the cephalic vein with electrocautery and sharp dissection.  This was difficult due to obvious sclerotic capsule around the cephalic vein.  This was consistent with prior phlebitis.  In the process of stripping off this capsule, one area of the fistula was injuried and became attenuated.  I repaired this with multiple 6-0 Prolene stitches.  The luminal diameter did not appear to be compromised.  I dissected out 10 cm of the vein, which I felt was long enough to bridge from the shoulder exposure to the axillary exposure.   I dissected a subcutaneous tunnel from the axillary exposure to the shoulder exposure with a metzenbaum scissor.  I dilated the tunnel with my finger.  At this point, I gave the patient 5000 units of Heparin.  I marked the anterior orientation of the cephalic vein.  I clamped the fistula proximally and then tied off the fistula distally and transected it.  I delivered the proximal fistula through the subcutaneous tunnel into the axillary exposure.  I allowed the fistula to reinflate, verifying the orientation of the fistula.  I reclamped the fistula in the shoulder exposure.  I clamped the high brachial vein proximally and distally.  I made an venotomy and extended it with a Potts scissor.  I sewed the distal fistula to the high brachial vein in a end-to-side configuration with a running stitch of 6-0 Prolene.  Prior to completing this, I backbled all vessels.  No thrombus was noted.  I completed this anastomosis in the usual fashion.  I released all clamps.  Immediately there was an improved thrill in the fistula.  I gave the patient 30 mg of Protamine to reverse anticoagulation.  I washed out all incisions and packed them with dry guaze.  I controlled a few bleeding point with electrocautery.  The shoulder incision was repaired with 2-0 Vicryl, 3-0 Vicryl, and a  running subcuticular of 4-0 Vicryl.  The skin was cleaned, dried, and reinforced with Dermabond.  I then turned my attention to the axilla.  The axillary exposure was repaired with a layer of 2-0 Vicryl and a running subcuticular of 4-0 Vicryl.  The skin was cleaned, dried, and reinforced with Dermabond.     COMPLICATIONS: none  CONDITION: stable   Adele Barthel, MD [facility] Office: [phone] Pager: [phone]  03/10/2016, 2:15 PM

## 2016-03-10 NOTE — Transfer of Care (Signed)
Immediate Anesthesia Transfer of Care Note  Patient: James [name]  Procedure(s) Performed: Procedure(s): RIGHT CEPHALIC VEIN TURNDOWN (Right)  Patient Location: PACU  Anesthesia Type:General  Level of Consciousness: awake, alert  and patient cooperative  Airway & Oxygen Therapy: Patient Spontanous Breathing and Patient connected to nasal cannula oxygen  Post-op Assessment: Report given to RN, Post -op Vital signs reviewed and stable, Patient moving all extremities X 4 and Patient able to stick tongue midline  Post vital signs: Reviewed and stable  Last Vitals:  Filed Vitals:   03/10/16 1006  BP: 126/52  Pulse: 75  Temp: 36.8 C  Resp: 18    Complications: No apparent anesthesia complications

## 2016-03-11 ENCOUNTER — Encounter (HOSPITAL_COMMUNITY): Payer: Self-pay | Admitting: Vascular Surgery

## 2016-03-11 DIAGNOSIS — D509 Iron deficiency anemia, unspecified: Secondary | ICD-10-CM | POA: Diagnosis not present

## 2016-03-11 DIAGNOSIS — D631 Anemia in chronic kidney disease: Secondary | ICD-10-CM | POA: Diagnosis not present

## 2016-03-11 DIAGNOSIS — E118 Type 2 diabetes mellitus with unspecified complications: Secondary | ICD-10-CM | POA: Diagnosis not present

## 2016-03-11 DIAGNOSIS — N186 End stage renal disease: Secondary | ICD-10-CM | POA: Diagnosis not present

## 2016-03-11 DIAGNOSIS — N2581 Secondary hyperparathyroidism of renal origin: Secondary | ICD-10-CM | POA: Diagnosis not present

## 2016-03-13 DIAGNOSIS — D631 Anemia in chronic kidney disease: Secondary | ICD-10-CM | POA: Diagnosis not present

## 2016-03-13 DIAGNOSIS — N2581 Secondary hyperparathyroidism of renal origin: Secondary | ICD-10-CM | POA: Diagnosis not present

## 2016-03-13 DIAGNOSIS — E118 Type 2 diabetes mellitus with unspecified complications: Secondary | ICD-10-CM | POA: Diagnosis not present

## 2016-03-13 DIAGNOSIS — D509 Iron deficiency anemia, unspecified: Secondary | ICD-10-CM | POA: Diagnosis not present

## 2016-03-13 DIAGNOSIS — N186 End stage renal disease: Secondary | ICD-10-CM | POA: Diagnosis not present

## 2016-03-15 ENCOUNTER — Ambulatory Visit (HOSPITAL_COMMUNITY): Payer: Medicare Other | Admitting: Hematology & Oncology

## 2016-03-16 DIAGNOSIS — E118 Type 2 diabetes mellitus with unspecified complications: Secondary | ICD-10-CM | POA: Diagnosis not present

## 2016-03-16 DIAGNOSIS — D509 Iron deficiency anemia, unspecified: Secondary | ICD-10-CM | POA: Diagnosis not present

## 2016-03-16 DIAGNOSIS — N2581 Secondary hyperparathyroidism of renal origin: Secondary | ICD-10-CM | POA: Diagnosis not present

## 2016-03-16 DIAGNOSIS — D631 Anemia in chronic kidney disease: Secondary | ICD-10-CM | POA: Diagnosis not present

## 2016-03-16 DIAGNOSIS — N186 End stage renal disease: Secondary | ICD-10-CM | POA: Diagnosis not present

## 2016-03-18 DIAGNOSIS — D631 Anemia in chronic kidney disease: Secondary | ICD-10-CM | POA: Diagnosis not present

## 2016-03-18 DIAGNOSIS — N186 End stage renal disease: Secondary | ICD-10-CM | POA: Diagnosis not present

## 2016-03-18 DIAGNOSIS — D509 Iron deficiency anemia, unspecified: Secondary | ICD-10-CM | POA: Diagnosis not present

## 2016-03-18 DIAGNOSIS — N2581 Secondary hyperparathyroidism of renal origin: Secondary | ICD-10-CM | POA: Diagnosis not present

## 2016-03-18 DIAGNOSIS — E118 Type 2 diabetes mellitus with unspecified complications: Secondary | ICD-10-CM | POA: Diagnosis not present

## 2016-03-19 ENCOUNTER — Telehealth: Payer: Self-pay | Admitting: Vascular Surgery

## 2016-03-19 NOTE — Telephone Encounter (Signed)
sched appt for 5/5 at 9:30. Ph# had no vm. Mailed appt letter to inform pt of appt.

## 2016-03-19 NOTE — Telephone Encounter (Signed)
-----   Message from Mena Goes, RN sent at 03/10/2016  4:24 PM EDT ----- Regarding: schedule   ----- Message -----    From: Gabriel Earing, PA-C    Sent: 03/10/2016   2:39 PM      To: [facility] Charge Pool  S/p right cephalic vein turndown 0000000.  F/u with Dr. Bridgett Larsson in 4 weeks.  Thanks, Aldona Bar

## 2016-03-20 DIAGNOSIS — N2581 Secondary hyperparathyroidism of renal origin: Secondary | ICD-10-CM | POA: Diagnosis not present

## 2016-03-20 DIAGNOSIS — E118 Type 2 diabetes mellitus with unspecified complications: Secondary | ICD-10-CM | POA: Diagnosis not present

## 2016-03-20 DIAGNOSIS — D509 Iron deficiency anemia, unspecified: Secondary | ICD-10-CM | POA: Diagnosis not present

## 2016-03-20 DIAGNOSIS — N186 End stage renal disease: Secondary | ICD-10-CM | POA: Diagnosis not present

## 2016-03-20 DIAGNOSIS — D631 Anemia in chronic kidney disease: Secondary | ICD-10-CM | POA: Diagnosis not present

## 2016-03-23 DIAGNOSIS — D509 Iron deficiency anemia, unspecified: Secondary | ICD-10-CM | POA: Diagnosis not present

## 2016-03-23 DIAGNOSIS — N2581 Secondary hyperparathyroidism of renal origin: Secondary | ICD-10-CM | POA: Diagnosis not present

## 2016-03-23 DIAGNOSIS — N186 End stage renal disease: Secondary | ICD-10-CM | POA: Diagnosis not present

## 2016-03-23 DIAGNOSIS — D631 Anemia in chronic kidney disease: Secondary | ICD-10-CM | POA: Diagnosis not present

## 2016-03-23 DIAGNOSIS — E118 Type 2 diabetes mellitus with unspecified complications: Secondary | ICD-10-CM | POA: Diagnosis not present

## 2016-03-25 ENCOUNTER — Ambulatory Visit (HOSPITAL_COMMUNITY): Payer: Medicare Other | Admitting: Hematology & Oncology

## 2016-03-25 DIAGNOSIS — D631 Anemia in chronic kidney disease: Secondary | ICD-10-CM | POA: Diagnosis not present

## 2016-03-25 DIAGNOSIS — E118 Type 2 diabetes mellitus with unspecified complications: Secondary | ICD-10-CM | POA: Diagnosis not present

## 2016-03-25 DIAGNOSIS — N2581 Secondary hyperparathyroidism of renal origin: Secondary | ICD-10-CM | POA: Diagnosis not present

## 2016-03-25 DIAGNOSIS — N186 End stage renal disease: Secondary | ICD-10-CM | POA: Diagnosis not present

## 2016-03-25 DIAGNOSIS — D509 Iron deficiency anemia, unspecified: Secondary | ICD-10-CM | POA: Diagnosis not present

## 2016-03-26 IMAGING — US US BIOPSY
1 series · 7 of 7 positions shown · non-contrast
Comparison: none

CLINICAL DATA: Acute renal failure, suspect multiple myeloma

[Series 1: us biopsy · 0.24mm/px · 7 of 7 slices shown]
[im 1/7]
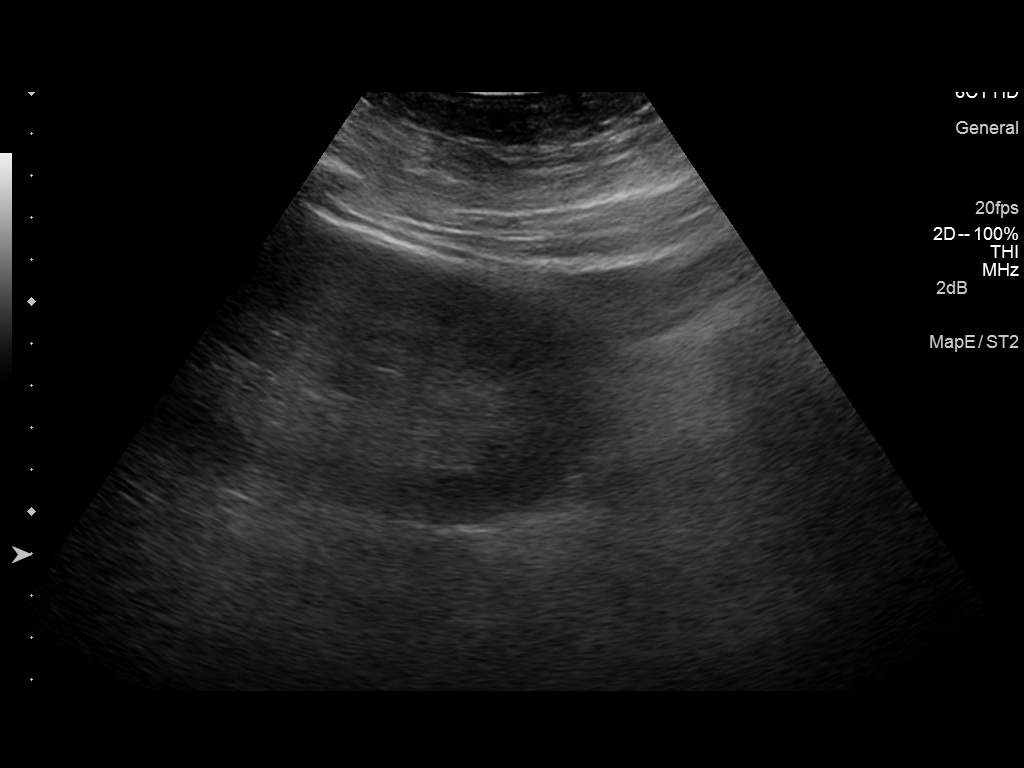
[im 2/7]
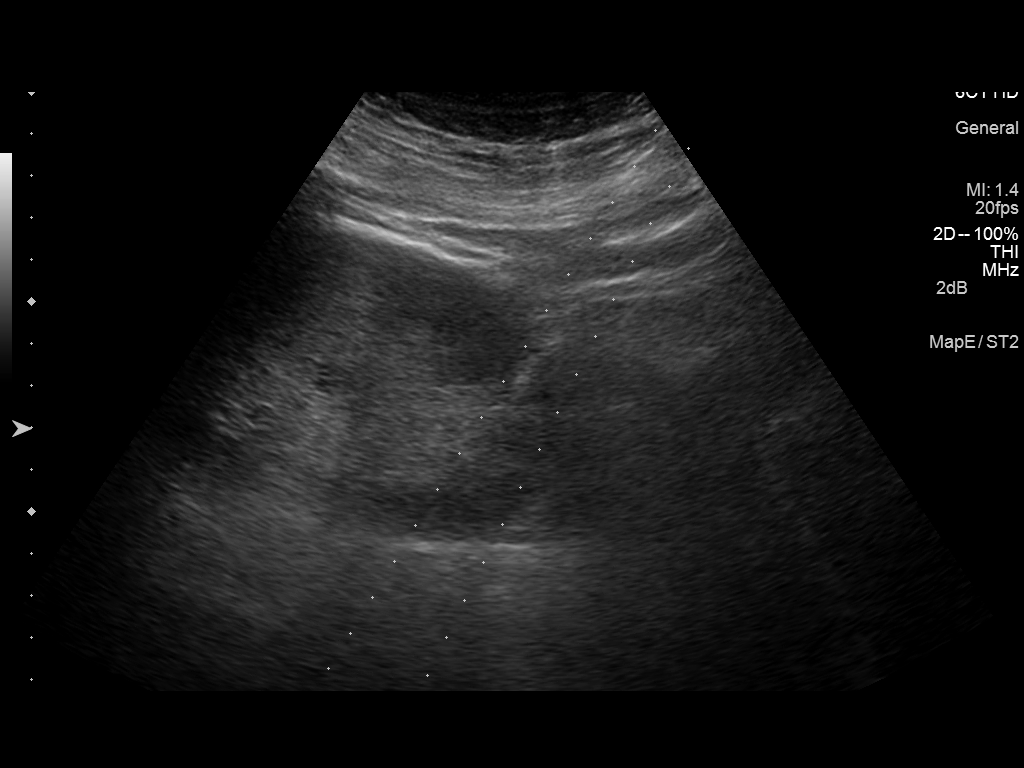
[im 3/7]
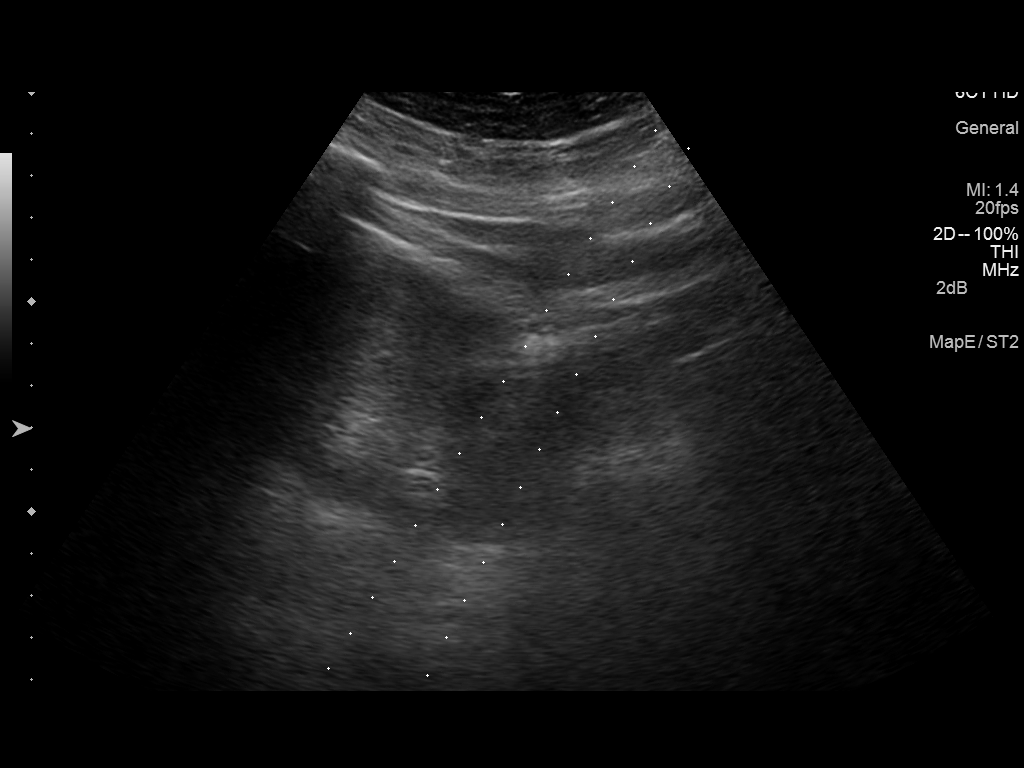
[im 4/7]
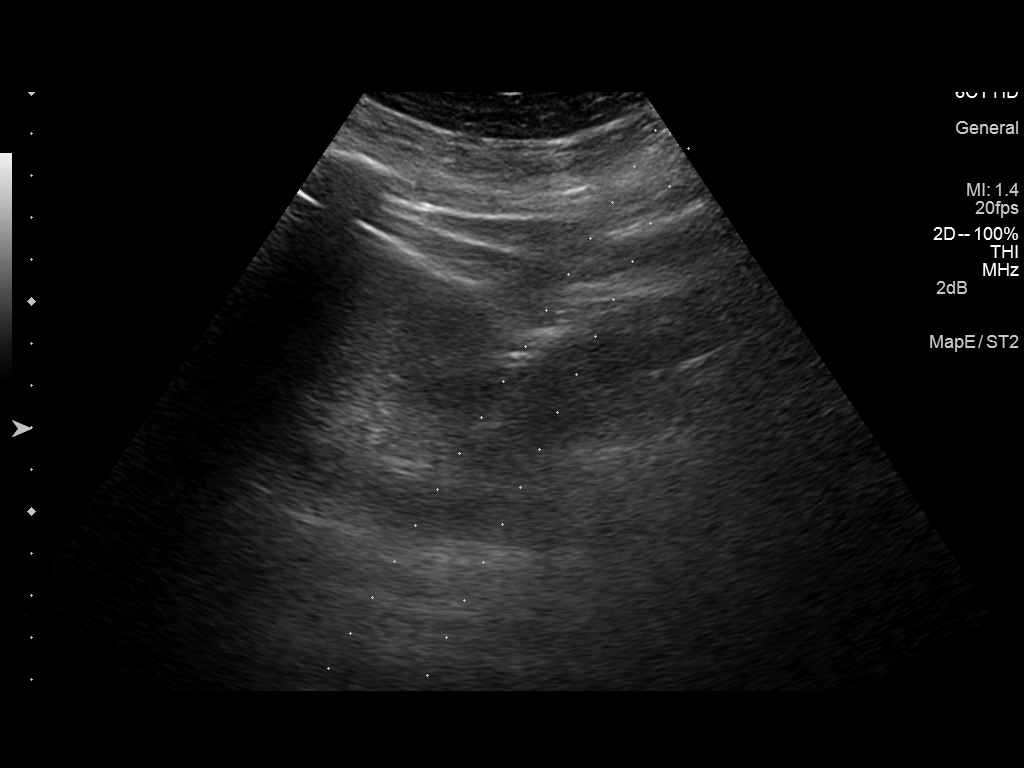
[im 5/7]
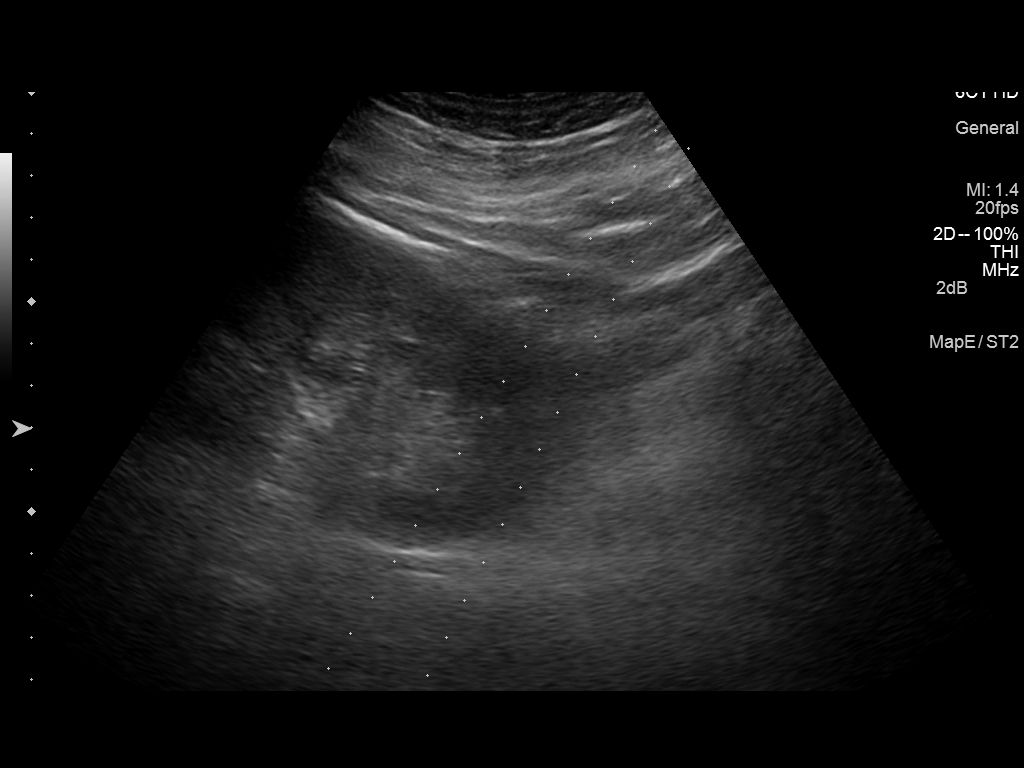
[im 6/7]
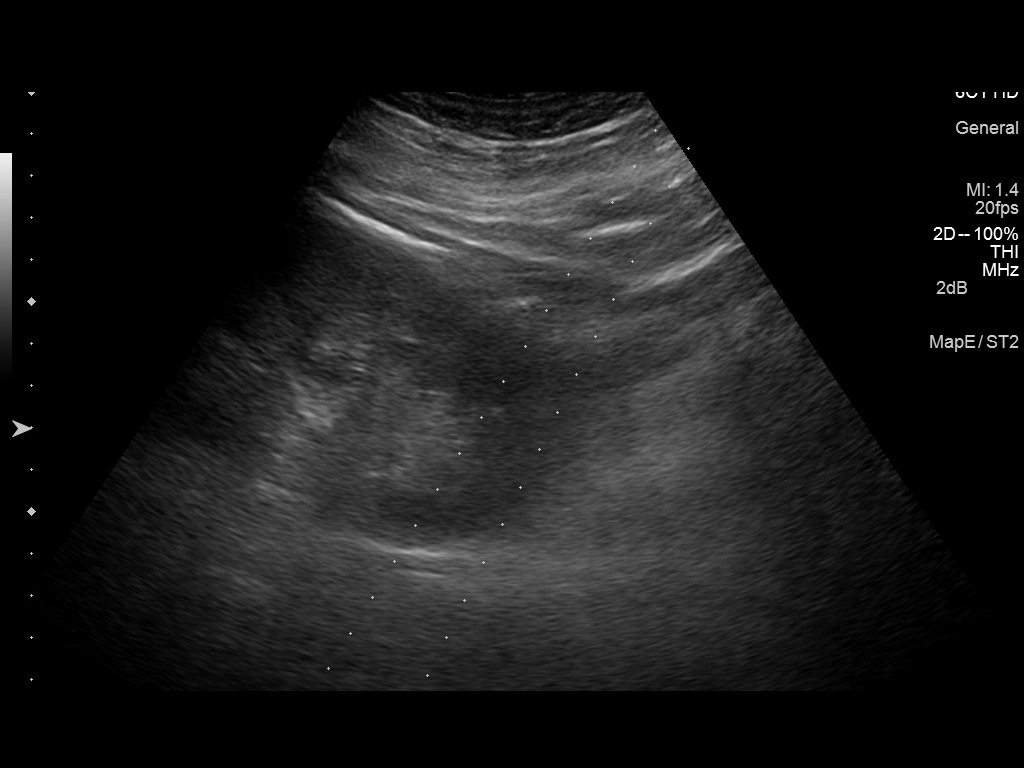
[im 7/7]
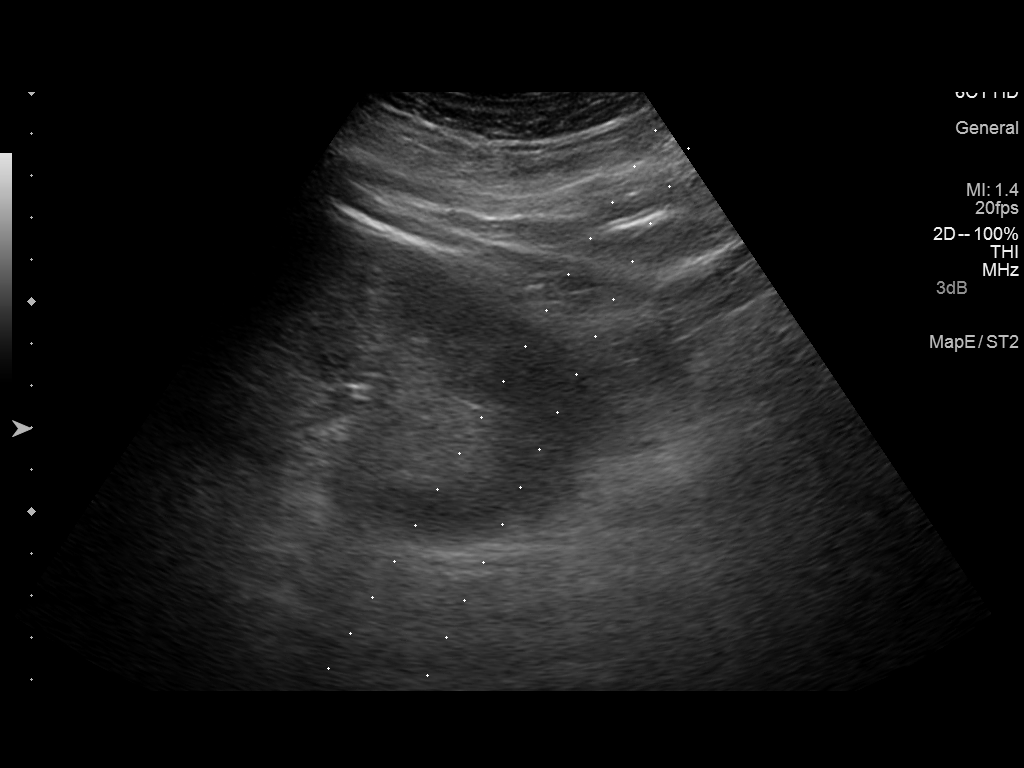

[7 of 7 positions shown; findings below may reference images not displayed]

EXAM:
ULTRASOUND GUIDED CORE BIOPSY OF LEFT RENAL CORTEX

MEDICATIONS:
1.0 mg IV Versed; 50 mcg IV Fentanyl

Total Moderate Sedation Time: 15

PROCEDURE:
The procedure, risks, benefits, and alternatives were explained to
the patient. Questions regarding the procedure were encouraged and
answered. The patient understands and consents to the procedure.

The left flank was prepped with Betadine In a sterile fashion, and a
sterile drape was applied covering the operative field. A sterile
gown and sterile gloves were used for the procedure. Local
anesthesia was provided with 1% Lidocaine.

Previous imaging reviewed. Patient positioned prone. Preliminary
ultrasound performed. The left kidney lower pole cortex was
localized. Under sterile conditions and local anesthesia, a 16 gauge
core biopsy needle was advanced to the left kidney lower pole
cortex. 2 16 gauge core biopsies obtained. Samples placed in saline.
These were intact and non fragmented. Postprocedure imaging
demonstrates a small amount of surrounding lower pole hemorrhage
from the biopsy. No hydronephrosis. Patient tolerated the biopsy
well.

COMPLICATIONS:
None.
FINDINGS: Imaging confirms needle placement to the left kidney lower pole
cortex for core biopsy
IMPRESSION: Successful ultrasound left renal core biopsy

## 2016-03-26 IMAGING — CT CT BIOPSY
1 of 2 series · 15 of 32 positions shown, 19 images · non-contrast
Comparison: none

CLINICAL DATA: MULTIPLE MYELOMA

[Series 2: i-spiral 5.0 b40f · axial · 0.90mm/px · z∈[-294,-182]mm · 15 of 36 slices shown, 19 images]
[im 2/36  soft-tissue]
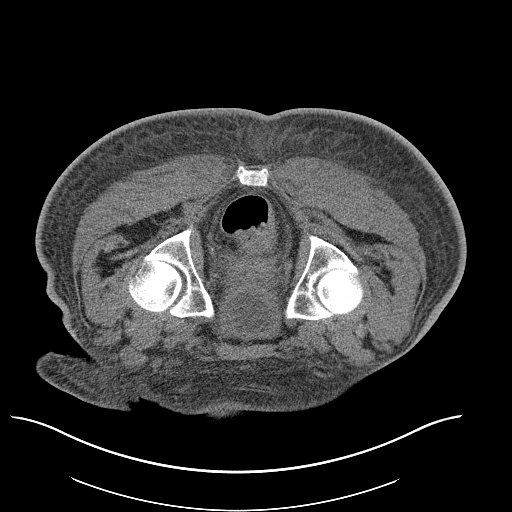
[im 2/36  bone]
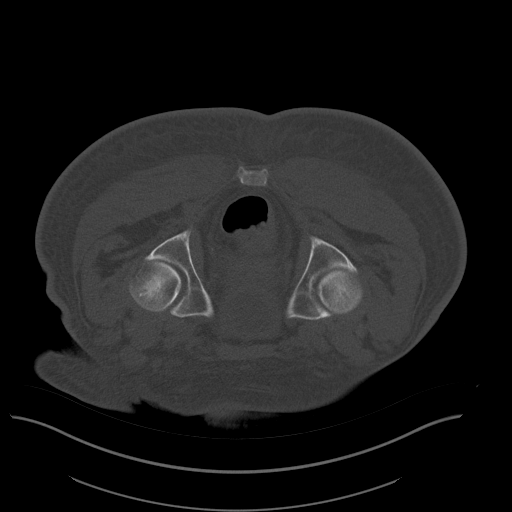
[im 5/36  soft-tissue]
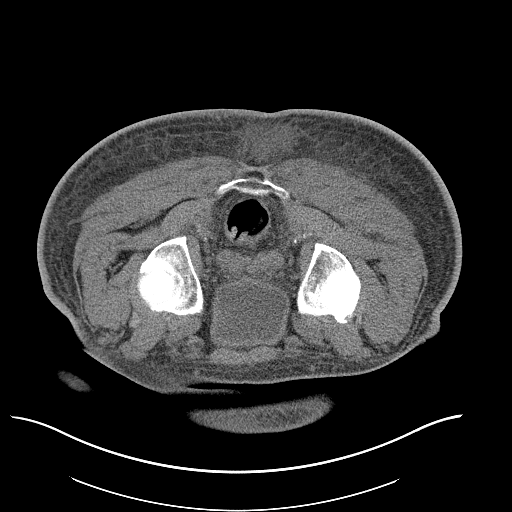
[im 8/36  soft-tissue]
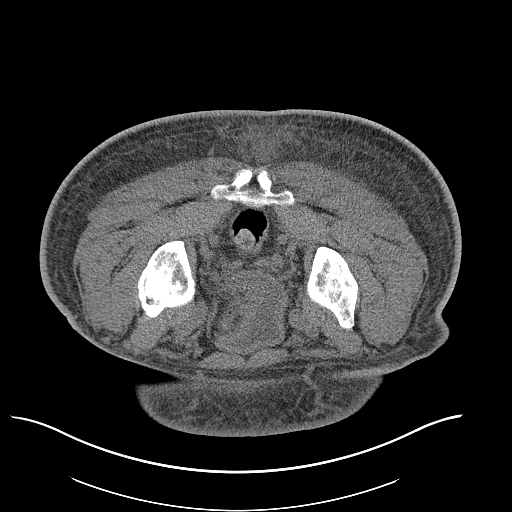
[im 10/36  soft-tissue]
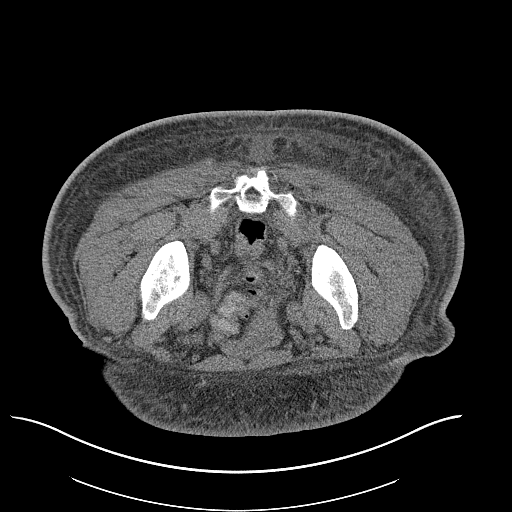
[im 13/36  soft-tissue]
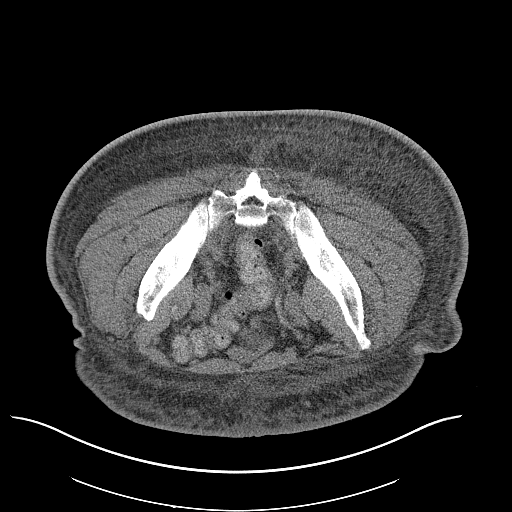
[im 16/36  soft-tissue]
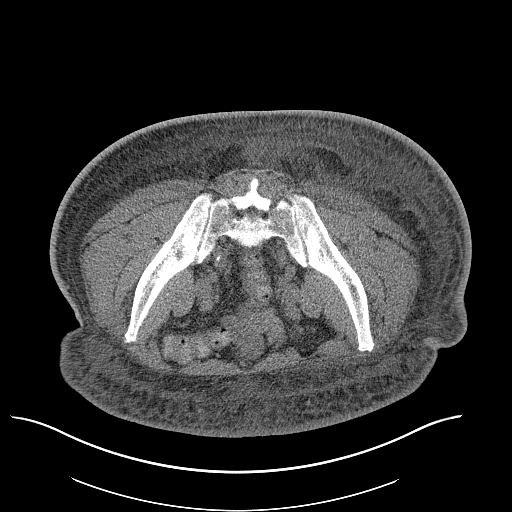
[im 19/36  soft-tissue]
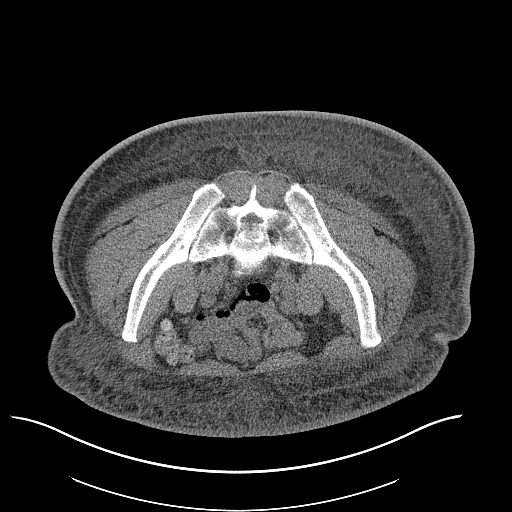
[im 20/36  soft-tissue]
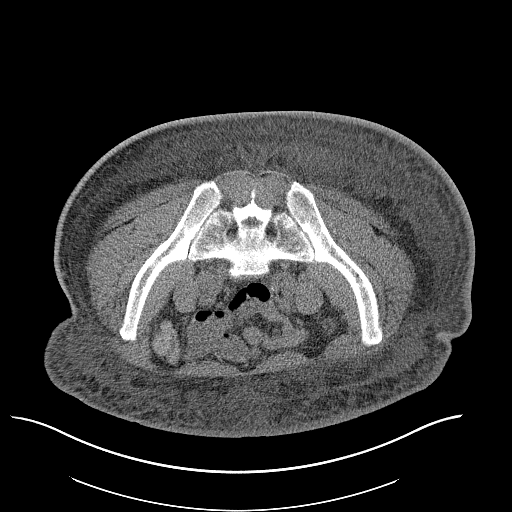
[im 23/36  soft-tissue]
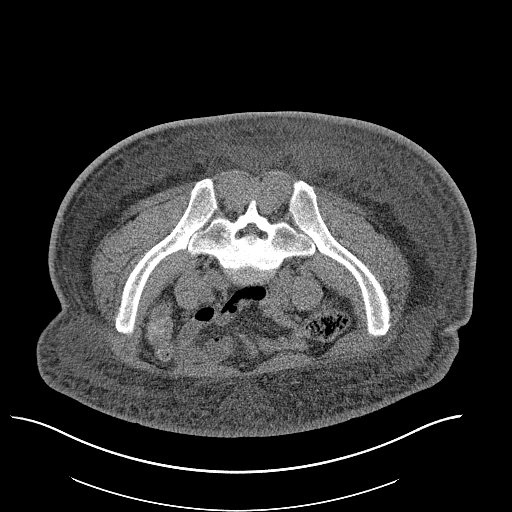
[im 23/36  bone]
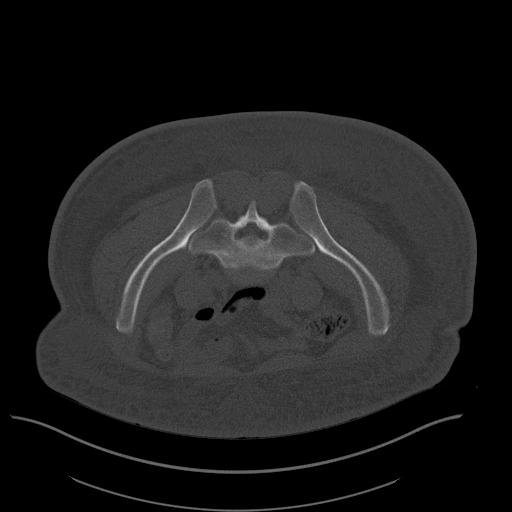
[im 26/36  soft-tissue]
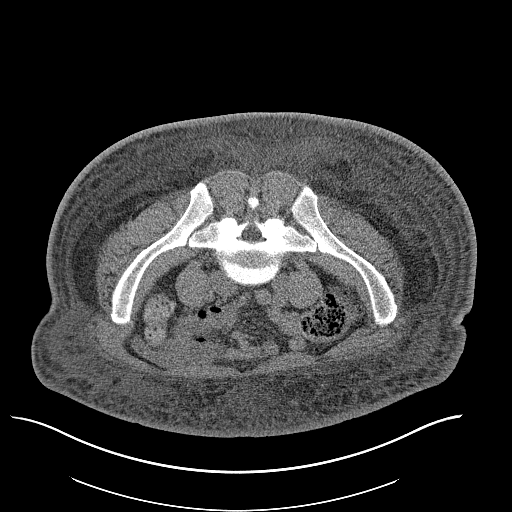
[im 29/36  soft-tissue]
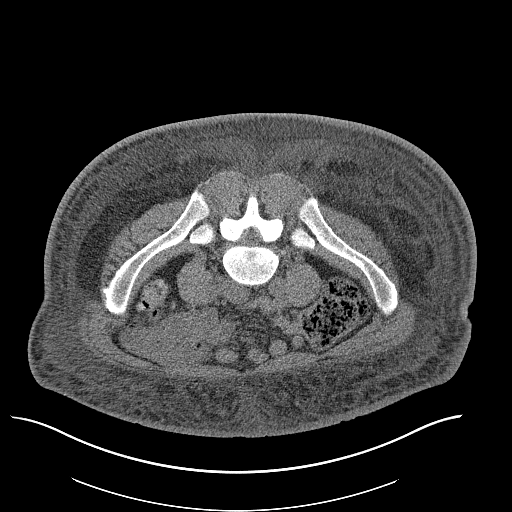
[im 30/36  lung]
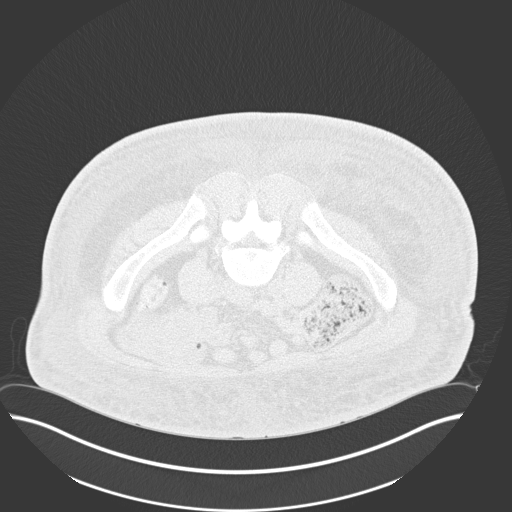
[im 31/36  soft-tissue]
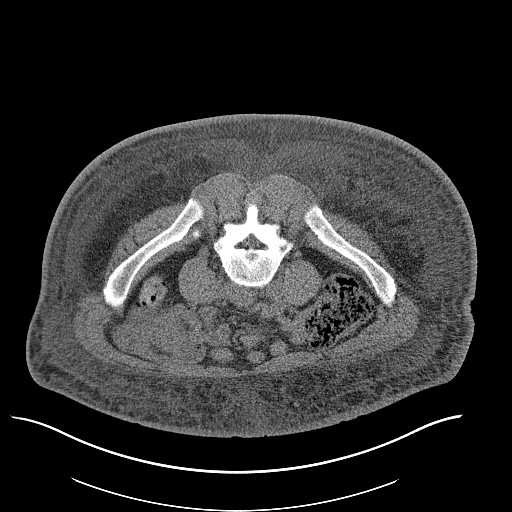
[im 31/36  lung]
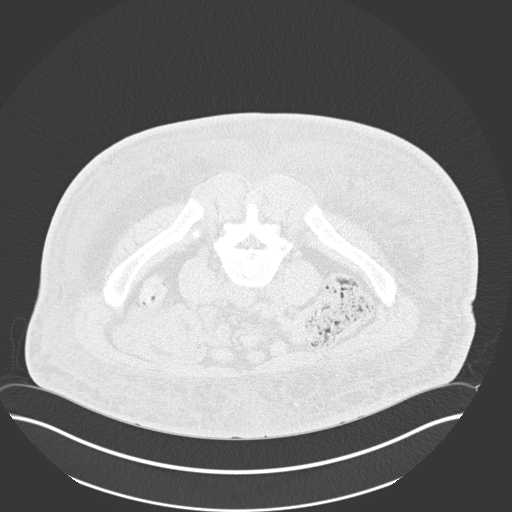
[im 33/36  lung]
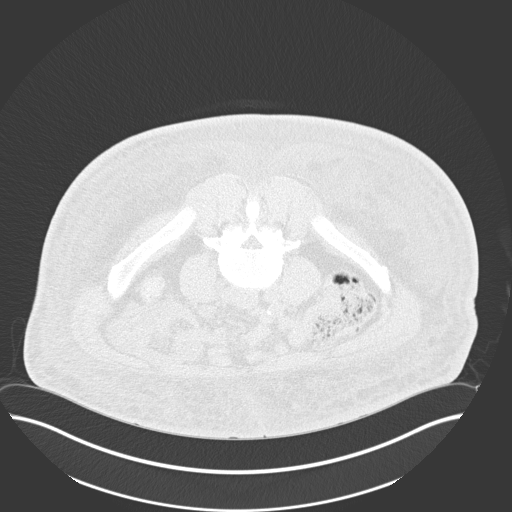
[im 34/36  soft-tissue]
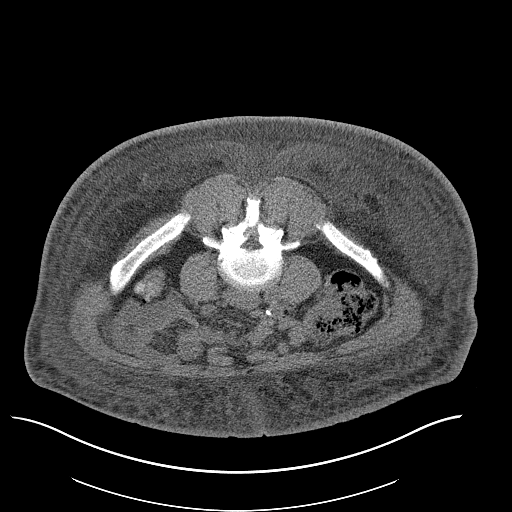
[im 34/36  lung]
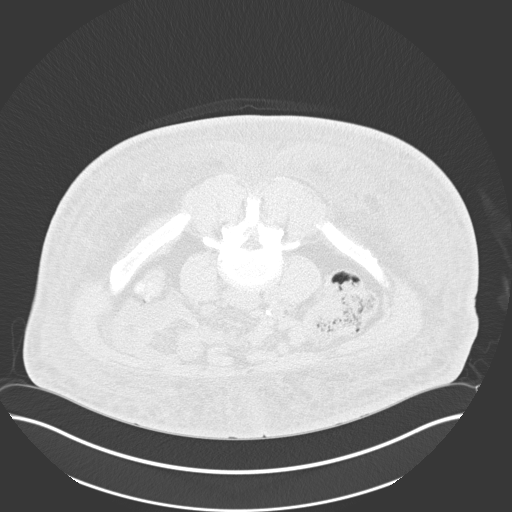

[15 of 32 positions shown; findings below may reference images not displayed]

EXAM:
CT GUIDED RIGHT ILIAC BONE MARROW ASPIRATION AND CORE BIOPSY

Radiologist:  Porquet, Eljose

Guidance:  CT

FLUOROSCOPY TIME:  None.

MEDICATIONS AND MEDICAL HISTORY:
1 mg Versed, 50 mcg fentanyl

ANESTHESIA/SEDATION:
15 minutes

CONTRAST:  None.

COMPLICATIONS:
None

PROCEDURE:
Informed consent was obtained from the patient following explanation
of the procedure, risks, benefits and alternatives. The patient
understands, agrees and consents for the procedure. All questions
were addressed. A time out was performed.

The patient was positioned prone and noncontrast localization CT was
performed of the pelvis to demonstrate the iliac marrow spaces.

Maximal barrier sterile technique utilized including caps, mask,
sterile gowns, sterile gloves, large sterile drape, hand hygiene,
and betadine prep.

Under sterile conditions and local anesthesia, an 11 gauge coaxial
bone biopsy needle was advanced into the right iliac marrow space.
Needle position was confirmed with CT imaging. Initially, bone
marrow aspiration was performed. Next, the 11 gauge outer cannula
was utilized to obtain a right iliac bone marrow core biopsy. Needle
was removed. Hemostasis was obtained with compression. The patient
tolerated the procedure well. Samples were prepared with the
cytotechnologist. No immediate complications.
IMPRESSION: CT guided right iliac bone marrow aspiration and core biopsy.

## 2016-03-27 DIAGNOSIS — N186 End stage renal disease: Secondary | ICD-10-CM | POA: Diagnosis not present

## 2016-03-27 DIAGNOSIS — N2581 Secondary hyperparathyroidism of renal origin: Secondary | ICD-10-CM | POA: Diagnosis not present

## 2016-03-27 DIAGNOSIS — D509 Iron deficiency anemia, unspecified: Secondary | ICD-10-CM | POA: Diagnosis not present

## 2016-03-27 DIAGNOSIS — D631 Anemia in chronic kidney disease: Secondary | ICD-10-CM | POA: Diagnosis not present

## 2016-03-27 DIAGNOSIS — E118 Type 2 diabetes mellitus with unspecified complications: Secondary | ICD-10-CM | POA: Diagnosis not present

## 2016-03-30 DIAGNOSIS — D509 Iron deficiency anemia, unspecified: Secondary | ICD-10-CM | POA: Diagnosis not present

## 2016-03-30 DIAGNOSIS — N186 End stage renal disease: Secondary | ICD-10-CM | POA: Diagnosis not present

## 2016-03-30 DIAGNOSIS — E118 Type 2 diabetes mellitus with unspecified complications: Secondary | ICD-10-CM | POA: Diagnosis not present

## 2016-03-30 DIAGNOSIS — N2581 Secondary hyperparathyroidism of renal origin: Secondary | ICD-10-CM | POA: Diagnosis not present

## 2016-03-30 DIAGNOSIS — D631 Anemia in chronic kidney disease: Secondary | ICD-10-CM | POA: Diagnosis not present

## 2016-04-01 DIAGNOSIS — N2581 Secondary hyperparathyroidism of renal origin: Secondary | ICD-10-CM | POA: Diagnosis not present

## 2016-04-01 DIAGNOSIS — N186 End stage renal disease: Secondary | ICD-10-CM | POA: Diagnosis not present

## 2016-04-01 DIAGNOSIS — D631 Anemia in chronic kidney disease: Secondary | ICD-10-CM | POA: Diagnosis not present

## 2016-04-01 DIAGNOSIS — E118 Type 2 diabetes mellitus with unspecified complications: Secondary | ICD-10-CM | POA: Diagnosis not present

## 2016-04-01 DIAGNOSIS — D509 Iron deficiency anemia, unspecified: Secondary | ICD-10-CM | POA: Diagnosis not present

## 2016-04-02 ENCOUNTER — Encounter: Payer: Self-pay | Admitting: Vascular Surgery

## 2016-04-03 DIAGNOSIS — N186 End stage renal disease: Secondary | ICD-10-CM | POA: Diagnosis not present

## 2016-04-03 DIAGNOSIS — D631 Anemia in chronic kidney disease: Secondary | ICD-10-CM | POA: Diagnosis not present

## 2016-04-03 DIAGNOSIS — E118 Type 2 diabetes mellitus with unspecified complications: Secondary | ICD-10-CM | POA: Diagnosis not present

## 2016-04-03 DIAGNOSIS — N2581 Secondary hyperparathyroidism of renal origin: Secondary | ICD-10-CM | POA: Diagnosis not present

## 2016-04-03 DIAGNOSIS — D509 Iron deficiency anemia, unspecified: Secondary | ICD-10-CM | POA: Diagnosis not present

## 2016-04-04 DIAGNOSIS — N041 Nephrotic syndrome with focal and segmental glomerular lesions: Secondary | ICD-10-CM | POA: Diagnosis not present

## 2016-04-04 DIAGNOSIS — Z992 Dependence on renal dialysis: Secondary | ICD-10-CM | POA: Diagnosis not present

## 2016-04-04 DIAGNOSIS — N186 End stage renal disease: Secondary | ICD-10-CM | POA: Diagnosis not present

## 2016-04-06 DIAGNOSIS — D509 Iron deficiency anemia, unspecified: Secondary | ICD-10-CM | POA: Diagnosis not present

## 2016-04-06 DIAGNOSIS — E118 Type 2 diabetes mellitus with unspecified complications: Secondary | ICD-10-CM | POA: Diagnosis not present

## 2016-04-06 DIAGNOSIS — D631 Anemia in chronic kidney disease: Secondary | ICD-10-CM | POA: Diagnosis not present

## 2016-04-06 DIAGNOSIS — N2581 Secondary hyperparathyroidism of renal origin: Secondary | ICD-10-CM | POA: Diagnosis not present

## 2016-04-06 DIAGNOSIS — Z23 Encounter for immunization: Secondary | ICD-10-CM | POA: Diagnosis not present

## 2016-04-06 DIAGNOSIS — N186 End stage renal disease: Secondary | ICD-10-CM | POA: Diagnosis not present

## 2016-04-08 DIAGNOSIS — N2581 Secondary hyperparathyroidism of renal origin: Secondary | ICD-10-CM | POA: Diagnosis not present

## 2016-04-08 DIAGNOSIS — D631 Anemia in chronic kidney disease: Secondary | ICD-10-CM | POA: Diagnosis not present

## 2016-04-08 DIAGNOSIS — N186 End stage renal disease: Secondary | ICD-10-CM | POA: Diagnosis not present

## 2016-04-08 DIAGNOSIS — E118 Type 2 diabetes mellitus with unspecified complications: Secondary | ICD-10-CM | POA: Diagnosis not present

## 2016-04-08 DIAGNOSIS — D509 Iron deficiency anemia, unspecified: Secondary | ICD-10-CM | POA: Diagnosis not present

## 2016-04-08 DIAGNOSIS — Z23 Encounter for immunization: Secondary | ICD-10-CM | POA: Diagnosis not present

## 2016-04-08 NOTE — Progress Notes (Signed)
Postoperative Access Visit   History of Present Illness  James [name] is a 60 y.o. year old male who presents for postoperative follow-up for: L cephalic vein turndown (Date: 03/10/16).  The patient's wounds are healed.  The patient notes no steal symptoms.  The patient is able to complete their activities of daily living.  The patient's current symptoms are: none.  Past Medical History  Diagnosis Date  . Hypertension   . CHF (congestive heart failure) ([place])   . Diabetes mellitus without complication ([place])     Type II  . Chronic kidney disease     TTHS  . Asthma     as a child    Past Surgical History  Procedure Laterality Date  . Colonoscopy    . Av fistula placement Left 04/08/2015    Procedure: LEFT RADIOCEPHALIC ARTERIOVENOUS (AV) FISTULA CREATION;  Surgeon: Elam Dutch, MD;  Location: [facility];  Service: Vascular;  Laterality: Left;  . Peripheral vascular catheterization N/A 07/04/2015    Procedure: Fistulagram;  Surgeon: Elam Dutch, MD;  Location: [facility];  Service: Cardiovascular;  Laterality: N/A;  . Ligation of competing branches of arteriovenous fistula Left 07/16/2015    Procedure: LIGATION OF SIDE BRANCHES OF ARTERIOVENOUS FISTULA LEFT ARM;  Surgeon: Rosetta Posner, MD;  Location: [facility];  Service: Vascular;  Laterality: Left;  . Peripheral vascular catheterization N/A 09/08/2015    Procedure: Fistulagram;  Surgeon: Conrad Elroy, MD;  Location: [facility];  Service: Cardiovascular;  Laterality: N/A;  . Ligation of arteriovenous  fistula Left 09/15/2015    Procedure: LIGATION OF RADIOCEPHALIC ARTERIOVENOUS FISTULA - LEFT ARM;  Surgeon: Conrad Antares, MD;  Location: [facility];  Service: Vascular;  Laterality: Left;  . Av fistula placement Right 09/15/2015    Procedure: RADIOCEPHALIC VERSUS BRACHIOCEPHALIC ARTERIOVENOUS (AV) FISTULA CREATION - RIGHT ARM;  Surgeon: Conrad Oakwood, MD;  Location: [place] [facility];  Service: Vascular;  Laterality: Right;  . Peripheral  vascular catheterization Right 02/23/2016    Procedure: Fistulagram;  Surgeon: Conrad Frankfort, MD;  Location: [facility];  Service: Cardiovascular;  Laterality: Right;  . Revison of arteriovenous fistula Right 03/10/2016    Procedure: RIGHT CEPHALIC VEIN TURNDOWN;  Surgeon: Conrad Natrona, MD;  Location: [facility];  Service: Vascular;  Laterality: Right;    Social History   Social History  . Marital Status: Single    Spouse Name: N/A  . Number of Children: N/A  . Years of Education: N/A   Occupational History  . Not on file.   Social History Main Topics  . Smoking status: Former Smoker -- 1 years  . Smokeless tobacco: Never Used  . Alcohol Use: No  . Drug Use: No  . Sexual Activity: Not on file   Other Topics Concern  . Not on file   Social History Narrative    History reviewed. No pertinent family history.   Current Outpatient Prescriptions  Medication Sig Dispense Refill  . calcium acetate (PHOSLO) 667 MG capsule Take 2 capsules (1,334 mg total) by mouth 3 (three) times daily with meals. 90 capsule 0  . Darbepoetin Alfa (ARANESP) 100 MCG/0.5ML SOSY injection Inject 0.5 mLs (100 mcg total) into the vein every Thursday with hemodialysis. Will be given at dialysis.    Marland Kitchen gabapentin (NEURONTIN) 300 MG capsule Take 1 capsule (300 mg total) by mouth at bedtime. 30 capsule 0  . insulin detemir (LEVEMIR) 100 UNIT/ML injection Inject 20 Units  into the skin 2 (two) times daily.    . isosorbide mononitrate (IMDUR) 60 MG 24 hr tablet Take 1 tablet (60 mg total) by mouth daily. 30 tablet 0  . diphenhydrAMINE (BENADRYL) 25 MG tablet Take 50 mg by mouth 2 (two) times daily as needed for allergies. Reported on 04/09/2016    . hydrALAZINE (APRESOLINE) 25 MG tablet Take 1 tablet (25 mg total) by mouth 2 (two) times daily. (Patient not taking: Reported on 02/13/2016) 60 tablet 0  . Nutritional Supplements (FEEDING SUPPLEMENT, NEPRO CARB STEADY,) LIQD Take 237 mLs by mouth 3 (three) times a week.  Reported on 04/09/2016     No current facility-administered medications for this visit.     No Known Allergies   REVIEW OF SYSTEMS:  (Positives checked otherwise negative)  CARDIOVASCULAR:   [ ]  chest pain,  [ ]  chest pressure,  [ ]  palpitations,  [ ]  shortness of breath when laying flat,  [ ]  shortness of breath with exertion,   [ ]  pain in feet when walking,  [ ]  pain in feet when laying flat, [ ]  history of blood clot in veins (DVT),  [ ]  history of phlebitis,  [ ]  swelling in legs,  [ ]  varicose veins  PULMONARY:   [ ]  productive cough,  [ ]  asthma,  [ ]  wheezing  NEUROLOGIC:   [ ]  weakness in arms or legs,  [ ]  numbness in arms or legs,  [ ]  difficulty speaking or slurred speech,  [ ]  temporary loss of vision in one eye,  [ ]  dizziness  HEMATOLOGIC:   [ ]  bleeding problems,  [ ]  problems with blood clotting too easily  MUSCULOSKEL:   [ ]  joint pain, [ ]  joint swelling  GASTROINTEST:   [ ]  vomiting blood,  [ ]  blood in stool     GENITOURINARY:   [ ]  burning with urination,  [ ]  blood in urine [x]  ESRD-HD- T/R/S  PSYCHIATRIC:   [ ]  history of major depression  INTEGUMENTARY:   [ ]  rashes,  [ ]  ulcers  CONSTITUTIONAL:   [ ]  fever,  [ ]  chills   For VQI Use Only  PRE-ADM LIVING: Home  AMB STATUS: Ambulatory  Physical Examination Filed Vitals:   04/09/16 0913  BP: 124/70  Pulse: 82  Temp: 98.4 F (36.9 C)  Resp: 16   Pulmonary: Sym exp, good air movt, CTAB, no rales, rhonchi, & wheezing  Cardiac: RRR, Nl S1, S2, no Murmurs, rubs or gallops  LUE: Incision is healed, skin feels warm, hand grip is 5/5, sensation in digits is intact, +palpable pulse without thrill, bruit c/w high resistance signal  Medical Decision Making  James [name] is a 60 y.o. year old male who presents s/p L cephalic vein turndown   The physical exam is consistent with a fistula trying to occlude.  Will obtain R arm fistulogram, with likely intervention on 8  MAY 17 to try to salvage the access. I discussed with the patient the nature of angiographic procedures, especially the limited patencies of any endovascular intervention.   The patient is aware of that the risks of an angiographic procedure include but are not limited to: bleeding, infection, access site complications, renal failure, embolization, rupture of vessel, dissection, arteriovenous fistula, possible need for emergent surgical intervention, possible need for surgical procedures to treat the patient's pathology, anaphylactic reaction to contrast, and stroke and death.   The patient is aware of the risks and agrees to proceed.  Thank you for allowing [place] to participate in this patient's care.  Adele Barthel, MD [facility] Office: [phone] Pager: [phone]  04/09/2016, 9:41 AM

## 2016-04-09 ENCOUNTER — Ambulatory Visit (INDEPENDENT_AMBULATORY_CARE_PROVIDER_SITE_OTHER): Payer: Medicare Other | Admitting: Vascular Surgery

## 2016-04-09 ENCOUNTER — Encounter: Payer: Self-pay | Admitting: Vascular Surgery

## 2016-04-09 ENCOUNTER — Other Ambulatory Visit: Payer: Self-pay

## 2016-04-09 VITALS — BP 124/70 | HR 82 | Temp 98.4°F | Resp 16 | Ht 65.0 in | Wt 206.0 lb

## 2016-04-09 DIAGNOSIS — N186 End stage renal disease: Secondary | ICD-10-CM

## 2016-04-09 DIAGNOSIS — Z992 Dependence on renal dialysis: Secondary | ICD-10-CM

## 2016-04-10 DIAGNOSIS — N186 End stage renal disease: Secondary | ICD-10-CM | POA: Diagnosis not present

## 2016-04-10 DIAGNOSIS — E118 Type 2 diabetes mellitus with unspecified complications: Secondary | ICD-10-CM | POA: Diagnosis not present

## 2016-04-10 DIAGNOSIS — D509 Iron deficiency anemia, unspecified: Secondary | ICD-10-CM | POA: Diagnosis not present

## 2016-04-10 DIAGNOSIS — Z23 Encounter for immunization: Secondary | ICD-10-CM | POA: Diagnosis not present

## 2016-04-10 DIAGNOSIS — D631 Anemia in chronic kidney disease: Secondary | ICD-10-CM | POA: Diagnosis not present

## 2016-04-10 DIAGNOSIS — N2581 Secondary hyperparathyroidism of renal origin: Secondary | ICD-10-CM | POA: Diagnosis not present

## 2016-04-12 ENCOUNTER — Encounter (HOSPITAL_COMMUNITY)
Admission: RE | Disposition: A | Discharge status: Home / Self Care | Payer: Self-pay | Source: Ambulatory Visit | Attending: Vascular Surgery

## 2016-04-12 ENCOUNTER — Ambulatory Visit (HOSPITAL_COMMUNITY)
Admission: RE | Admit: 2016-04-12 | Discharge: 2016-04-12 | Disposition: A | Discharge status: Home / Self Care | Payer: Medicare Other | Source: Ambulatory Visit | Attending: Vascular Surgery | Admitting: Vascular Surgery

## 2016-04-12 DIAGNOSIS — I509 Heart failure, unspecified: Secondary | ICD-10-CM | POA: Insufficient documentation

## 2016-04-12 DIAGNOSIS — E1122 Type 2 diabetes mellitus with diabetic chronic kidney disease: Secondary | ICD-10-CM | POA: Diagnosis not present

## 2016-04-12 DIAGNOSIS — Z794 Long term (current) use of insulin: Secondary | ICD-10-CM | POA: Diagnosis not present

## 2016-04-12 DIAGNOSIS — T82898A Other specified complication of vascular prosthetic devices, implants and grafts, initial encounter: Secondary | ICD-10-CM | POA: Insufficient documentation

## 2016-04-12 DIAGNOSIS — I132 Hypertensive heart and chronic kidney disease with heart failure and with stage 5 chronic kidney disease, or end stage renal disease: Secondary | ICD-10-CM | POA: Diagnosis not present

## 2016-04-12 DIAGNOSIS — Z87891 Personal history of nicotine dependence: Secondary | ICD-10-CM | POA: Insufficient documentation

## 2016-04-12 DIAGNOSIS — Y832 Surgical operation with anastomosis, bypass or graft as the cause of abnormal reaction of the patient, or of later complication, without mention of misadventure at the time of the procedure: Secondary | ICD-10-CM | POA: Insufficient documentation

## 2016-04-12 DIAGNOSIS — Z992 Dependence on renal dialysis: Secondary | ICD-10-CM | POA: Diagnosis not present

## 2016-04-12 DIAGNOSIS — N186 End stage renal disease: Secondary | ICD-10-CM | POA: Diagnosis not present

## 2016-04-12 DIAGNOSIS — J45909 Unspecified asthma, uncomplicated: Secondary | ICD-10-CM | POA: Insufficient documentation

## 2016-04-12 HISTORY — PX: PERIPHERAL VASCULAR CATHETERIZATION: SHX172C

## 2016-04-12 LAB — POCT I-STAT, CHEM 8
BUN: 30 mg/dL — ABNORMAL HIGH (ref 6–20)
CHLORIDE: 92 mmol/L — AB (ref 101–111)
CREATININE: 7.4 mg/dL — AB (ref 0.61–1.24)
Calcium, Ion: 1.11 mmol/L — ABNORMAL LOW (ref 1.12–1.23)
GLUCOSE: 277 mg/dL — AB (ref 65–99)
HEMATOCRIT: 42 % (ref 39.0–52.0)
HEMOGLOBIN: 14.3 g/dL (ref 13.0–17.0)
POTASSIUM: 3 mmol/L — AB (ref 3.5–5.1)
Sodium: 134 mmol/L — ABNORMAL LOW (ref 135–145)
TCO2: 29 mmol/L (ref 0–100)

## 2016-04-12 LAB — GLUCOSE, CAPILLARY: Glucose-Capillary: 240 mg/dL — ABNORMAL HIGH (ref 65–99)

## 2016-04-12 SURGERY — A/V SHUNTOGRAM/FISTULAGRAM

## 2016-04-12 MED ORDER — LIDOCAINE HCL (PF) 1 % IJ SOLN
INTRAMUSCULAR | Status: AC
  Filled 2016-04-12: qty 30

## 2016-04-12 MED ORDER — SODIUM CHLORIDE 0.9% FLUSH: 3.0000 mL | Freq: Two times a day (BID) | INTRAVENOUS | Status: DC

## 2016-04-12 MED ORDER — IODIXANOL 320 MG/ML IV SOLN
INTRAVENOUS | Status: DC | PRN
  Administered 2016-04-12: 30 mL

## 2016-04-12 MED ORDER — SODIUM CHLORIDE 0.9 % IV SOLN: 250.0000 mL | INTRAVENOUS | Status: DC | PRN

## 2016-04-12 MED ORDER — HEPARIN (PORCINE) IN NACL 2-0.9 UNIT/ML-% IJ SOLN
INTRAMUSCULAR | Status: DC | PRN
  Administered 2016-04-12: 500 mL

## 2016-04-12 MED ORDER — SODIUM CHLORIDE 0.9% FLUSH: 3.0000 mL | INTRAVENOUS | Status: DC | PRN

## 2016-04-12 MED ORDER — LIDOCAINE HCL (PF) 1 % IJ SOLN
INTRAMUSCULAR | Status: DC | PRN
  Administered 2016-04-12: 2 mL

## 2016-04-12 MED ORDER — SODIUM CHLORIDE 0.9% FLUSH
3.0000 mL | INTRAVENOUS | Status: DC | PRN
  Administered 2016-04-12: 3 mL via INTRAVENOUS
  Filled 2016-04-12: qty 3

## 2016-04-12 MED ORDER — ACETAMINOPHEN 325 MG PO TABS: 650.0000 mg | ORAL_TABLET | ORAL | Status: DC | PRN

## 2016-04-12 SURGICAL SUPPLY — 11 items
BAG SNAP BAND KOVER 36X36 (MISCELLANEOUS) ×3 IMPLANT
COVER DOME SNAP 22 D (MISCELLANEOUS) ×3 IMPLANT
COVER PRB 48X5XTLSCP FOLD TPE (BAG) ×1 IMPLANT
COVER PROBE 5X48 (BAG) ×2
KIT MICROINTRODUCER STIFF 5F (SHEATH) ×3 IMPLANT
PROTECTION STATION PRESSURIZED (MISCELLANEOUS) ×3
STATION PROTECTION PRESSURIZED (MISCELLANEOUS) ×1 IMPLANT
STOPCOCK MORSE 400PSI 3WAY (MISCELLANEOUS) ×3 IMPLANT
TRAY PV CATH (CUSTOM PROCEDURE TRAY) ×3 IMPLANT
TUBING CIL FLEX 10 FLL-RA (TUBING) ×3 IMPLANT
WIRE BENTSON .035X145CM (WIRE) ×3 IMPLANT

## 2016-04-12 NOTE — Progress Notes (Signed)
Call to see pt for right arm swelling.  Had fistulogram today which showed right central vein occlusion.  His right arm has been chronically swollen but he thinks this may be a little more swollen post procedure today.  On exam there is no hematoma adjacent to the puncture site.  Right arm diffusely swollen with venous collaterals, right hand warm, compartments to not feel full, motor sensory function of arm and hand intact  Pt with chronic arm swelling difficult to know if this is any worse from earlier today but does not appear to be acute hematoma or procedurally related  Will schedule pt for right AV fistula ligation this Friday  Pt to keep arm elevated as much as possible   Ruta Hinds, MD [facility]: [phone] Pager: [phone]'

## 2016-04-12 NOTE — Discharge Instructions (Signed)
Fistulogram, Care After °Refer to this sheet in the next few weeks. These instructions provide you with information on caring for yourself after your procedure. Your health care provider may also give you more specific instructions. Your treatment has been planned according to current medical practices, but problems sometimes occur. Call your health care provider if you have any problems or questions after your procedure. °WHAT TO EXPECT AFTER THE PROCEDURE °After your procedure, it is typical to have the following: °· A small amount of discomfort in the area where the catheters were placed. °· A small amount of bruising around the fistula. °· Sleepiness and fatigue. °HOME CARE INSTRUCTIONS °· Rest at home for the day following your procedure. °· Do not drive or operate heavy machinery while taking pain medicine. °· Take medicines only as directed by your health care provider. °· Do not take baths, swim, or use a hot tub until your health care provider approves. You may shower 24 hours after the procedure or as directed by your health care provider. °· There are many different ways to close and cover an incision, including stitches, skin glue, and adhesive strips. Follow your health care provider's instructions on: °¨ Incision care. °¨ Bandage (dressing) changes and removal. °¨ Incision closure removal. °· Monitor your dialysis fistula carefully. °SEEK MEDICAL CARE IF: °· You have drainage, redness, swelling, or pain at your catheter site. °· You have a fever. °· You have chills. °SEEK IMMEDIATE MEDICAL CARE IF: °· You feel weak. °· You have trouble balancing. °· You have trouble moving your arms or legs. °· You have problems with your speech or vision. °· You can no longer feel a vibration or buzz when you put your fingers over your dialysis fistula. °· The limb that was used for the procedure: °¨ Swells. °¨ Is painful. °¨ Is cold. °¨ Is discolored, such as blue or pale white. °  °This information is not intended  to replace advice given to you by your health care provider. Make sure you discuss any questions you have with your health care provider. °  °Document Released: 04/08/2014 Document Reviewed: 04/08/2014 °Elsevier Interactive Patient Education ©2016 [employer] Inc[employer] ° °

## 2016-04-12 NOTE — H&P (View-Only) (Signed)
Postoperative Access Visit   History of Present Illness  James [name] is a 60 y.o. year old male who presents for postoperative follow-up for: L cephalic vein turndown (Date: 03/10/16).  The patient's wounds are healed.  The patient notes no steal symptoms.  The patient is able to complete their activities of daily living.  The patient's current symptoms are: none.  Past Medical History  Diagnosis Date  . Hypertension   . CHF (congestive heart failure) ([place])   . Diabetes mellitus without complication ([place])     Type II  . Chronic kidney disease     TTHS  . Asthma     as a child    Past Surgical History  Procedure Laterality Date  . Colonoscopy    . Av fistula placement Left 04/08/2015    Procedure: LEFT RADIOCEPHALIC ARTERIOVENOUS (AV) FISTULA CREATION;  Surgeon: Elam Dutch, MD;  Location: [facility];  Service: Vascular;  Laterality: Left;  . Peripheral vascular catheterization N/A 07/04/2015    Procedure: Fistulagram;  Surgeon: Elam Dutch, MD;  Location: [place];  Service: Cardiovascular;  Laterality: N/A;  . Ligation of competing branches of arteriovenous fistula Left 07/16/2015    Procedure: LIGATION OF SIDE BRANCHES OF ARTERIOVENOUS FISTULA LEFT ARM;  Surgeon: Rosetta Posner, MD;  Location: [facility];  Service: Vascular;  Laterality: Left;  . Peripheral vascular catheterization N/A 09/08/2015    Procedure: Fistulagram;  Surgeon: Conrad Nicollet, MD;  Location: [facility];  Service: Cardiovascular;  Laterality: N/A;  . Ligation of arteriovenous  fistula Left 09/15/2015    Procedure: LIGATION OF RADIOCEPHALIC ARTERIOVENOUS FISTULA - LEFT ARM;  Surgeon: Conrad Good Hope, MD;  Location: [facility];  Service: Vascular;  Laterality: Left;  . Av fistula placement Right 09/15/2015    Procedure: RADIOCEPHALIC VERSUS BRACHIOCEPHALIC ARTERIOVENOUS (AV) FISTULA CREATION - RIGHT ARM;  Surgeon: Conrad Morven, MD;  Location: [facility];  Service: Vascular;  Laterality: Right;  . Peripheral  vascular catheterization Right 02/23/2016    Procedure: Fistulagram;  Surgeon: Conrad St. George, MD;  Location: [facility];  Service: Cardiovascular;  Laterality: Right;  . Revison of arteriovenous fistula Right 03/10/2016    Procedure: RIGHT CEPHALIC VEIN TURNDOWN;  Surgeon: Conrad Opal, MD;  Location: [facility];  Service: Vascular;  Laterality: Right;    Social History   Social History  . Marital Status: Single    Spouse Name: N/A  . Number of Children: N/A  . Years of Education: N/A   Occupational History  . Not on file.   Social History Main Topics  . Smoking status: Former Smoker -- 1 years  . Smokeless tobacco: Never Used  . Alcohol Use: No  . Drug Use: No  . Sexual Activity: Not on file   Other Topics Concern  . Not on file   Social History Narrative    History reviewed. No pertinent family history.   Current Outpatient Prescriptions  Medication Sig Dispense Refill  . calcium acetate (PHOSLO) 667 MG capsule Take 2 capsules (1,334 mg total) by mouth 3 (three) times daily with meals. 90 capsule 0  . Darbepoetin Alfa (ARANESP) 100 MCG/0.5ML SOSY injection Inject 0.5 mLs (100 mcg total) into the vein every Thursday with hemodialysis. Will be given at dialysis.    Marland Kitchen gabapentin (NEURONTIN) 300 MG capsule Take 1 capsule (300 mg total) by mouth at bedtime. 30 capsule 0  . insulin detemir (LEVEMIR) 100 UNIT/ML injection Inject 20 Units  into the skin 2 (two) times daily.    . isosorbide mononitrate (IMDUR) 60 MG 24 hr tablet Take 1 tablet (60 mg total) by mouth daily. 30 tablet 0  . diphenhydrAMINE (BENADRYL) 25 MG tablet Take 50 mg by mouth 2 (two) times daily as needed for allergies. Reported on 04/09/2016    . hydrALAZINE (APRESOLINE) 25 MG tablet Take 1 tablet (25 mg total) by mouth 2 (two) times daily. (Patient not taking: Reported on 02/13/2016) 60 tablet 0  . Nutritional Supplements (FEEDING SUPPLEMENT, NEPRO CARB STEADY,) LIQD Take 237 mLs by mouth 3 (three) times a week.  Reported on 04/09/2016     No current facility-administered medications for this visit.     No Known Allergies   REVIEW OF SYSTEMS:  (Positives checked otherwise negative)  CARDIOVASCULAR:   [ ]  chest pain,  [ ]  chest pressure,  [ ]  palpitations,  [ ]  shortness of breath when laying flat,  [ ]  shortness of breath with exertion,   [ ]  pain in feet when walking,  [ ]  pain in feet when laying flat, [ ]  history of blood clot in veins (DVT),  [ ]  history of phlebitis,  [ ]  swelling in legs,  [ ]  varicose veins  PULMONARY:   [ ]  productive cough,  [ ]  asthma,  [ ]  wheezing  NEUROLOGIC:   [ ]  weakness in arms or legs,  [ ]  numbness in arms or legs,  [ ]  difficulty speaking or slurred speech,  [ ]  temporary loss of vision in one eye,  [ ]  dizziness  HEMATOLOGIC:   [ ]  bleeding problems,  [ ]  problems with blood clotting too easily  MUSCULOSKEL:   [ ]  joint pain, [ ]  joint swelling  GASTROINTEST:   [ ]  vomiting blood,  [ ]  blood in stool     GENITOURINARY:   [ ]  burning with urination,  [ ]  blood in urine [x]  ESRD-HD- T/R/S  PSYCHIATRIC:   [ ]  history of major depression  INTEGUMENTARY:   [ ]  rashes,  [ ]  ulcers  CONSTITUTIONAL:   [ ]  fever,  [ ]  chills   For VQI Use Only  PRE-ADM LIVING: Home  AMB STATUS: Ambulatory  Physical Examination Filed Vitals:   04/09/16 0913  BP: 124/70  Pulse: 82  Temp: 98.4 F (36.9 C)  Resp: 16   Pulmonary: Sym exp, good air movt, CTAB, no rales, rhonchi, & wheezing  Cardiac: RRR, Nl S1, S2, no Murmurs, rubs or gallops  LUE: Incision is healed, skin feels warm, hand grip is 5/5, sensation in digits is intact, +palpable pulse without thrill, bruit c/w high resistance signal  Medical Decision Making  [name] is a 60 y.o. year old male who presents s/p L cephalic vein turndown   The physical exam is consistent with a fistula trying to occlude.  Will obtain R arm fistulogram, with likely intervention on 8  MAY 17 to try to salvage the access. I discussed with the patient the nature of angiographic procedures, especially the limited patencies of any endovascular intervention.   The patient is aware of that the risks of an angiographic procedure include but are not limited to: bleeding, infection, access site complications, renal failure, embolization, rupture of vessel, dissection, arteriovenous fistula, possible need for emergent surgical intervention, possible need for surgical procedures to treat the patient's pathology, anaphylactic reaction to contrast, and stroke and death.   The patient is aware of the risks and agrees to proceed.  Thank you for allowing [place] to participate in this patient's care.  Adele Barthel, MD [facility] Office: [phone] Pager: [phone]  04/09/2016, 9:41 AM

## 2016-04-12 NOTE — Interval H&P Note (Signed)
[facility]  History and Physical Update  The patient was interviewed and re-examined.  The patient's previous History and Physical has been reviewed and is unchanged from my consult.  There is no change in the plan of care: R arm fistulogram, likely intervention.  Adele Barthel, MD [facility] Office: [phone] Pager: [phone]  04/12/2016, 12:12 PM

## 2016-04-12 NOTE — Progress Notes (Addendum)
Pt states his right arm/ fistula arm is getting bigger. Arm seems tighter than before and is aching. Dr Bridgett Larsson paged without return call. [facility] was called and I called PA Leontine Locket, she returned page and will come to see pt. Dr Oneida Alar came to assess right arm.

## 2016-04-12 NOTE — Progress Notes (Signed)
Lab results were called to James [name] for cath lab. No orders followed, k+3.0, she will talk with Dr Bridgett Larsson and advise further.

## 2016-04-12 NOTE — Op Note (Addendum)
     OPERATIVE NOTE   PROCEDURE: 1.  right brachiocephalic arteriovenous fistula cannulation under ultrasound guidance 2.  right arm fistulogram  PRE-OPERATIVE DIAGNOSIS: Central venous occlusion with poor right brachiocephalic arteriovenous fistula function  POST-OPERATIVE DIAGNOSIS: same as above   SURGEON: Adele Barthel, MD  ANESTHESIA: local  ESTIMATED BLOOD LOSS: 5 cc  FINDING(S): 1. Interval occlusion of proximal right innominate vein 2. Patent right brachiocephalic arteriovenous fistula with drainage via neck veins  SPECIMEN(S):  None  CONTRAST: 30 cc  INDICATIONS: James [name] is a 60 y.o. male who presents with right arm swelling after a recent right cephalic vein turndown.  He now has right arm swelling and poor function in the right brachiocephalic arteriovenous fistula.  The patient is scheduled for right arm fistulgram, possible intervention.  The patient is aware the risks include but are not limited to: bleeding, infection, thrombosis of the cannulated access, and possible anaphylactic reaction to the contrast.  The patient is aware of the risks of the procedure and elects to proceed forward.  DESCRIPTION: After full informed written consent was obtained, the patient was brought back to the angiography suite and placed supine upon the angiography table.  The patient was connected to monitoring equipment.  The right arm was prepped and draped in the standard fashion for a percutaneous access intervention.  Under ultrasound guidance, the right brachiocephalic arteriovenous fistula was cannulated with a micropuncture needle.  The microwire was advanced into the fistula and the needle was exchanged for the a microsheath, which was lodged 2 cm into the access.  The wire was removed and the sheath was connected to the IV extension tubing.  Hand injections were completed to image the access from the antecubitum up to the level of chest.  Based on the images, this patient will  need: ligation of right brachiocephalic arteriovenous fistula and placement of new access.  I put a Bentson wire through the sheath and advanced it to the proximal innominate vein.  It would not advance, suggesting chronic occlusion.  Based on the completion imaging, no further intervention is necessary.  The wire and balloon were removed from the sheath.  A 4-0 Monocryl purse-string suture was sewn around the sheath.  The sheath was removed while tying down the suture.  A sterile bandage was applied to the puncture site.   COMPLICATIONS: none  CONDITION: stable   Adele Barthel, MD [facility] Office: [phone] Pager: [phone]  04/12/2016 1:27 PM

## 2016-04-13 ENCOUNTER — Encounter (HOSPITAL_COMMUNITY): Payer: Self-pay | Admitting: Vascular Surgery

## 2016-04-13 ENCOUNTER — Other Ambulatory Visit: Payer: Self-pay

## 2016-04-13 DIAGNOSIS — N186 End stage renal disease: Secondary | ICD-10-CM

## 2016-04-13 DIAGNOSIS — D631 Anemia in chronic kidney disease: Secondary | ICD-10-CM | POA: Diagnosis not present

## 2016-04-13 DIAGNOSIS — Z23 Encounter for immunization: Secondary | ICD-10-CM | POA: Diagnosis not present

## 2016-04-13 DIAGNOSIS — N2581 Secondary hyperparathyroidism of renal origin: Secondary | ICD-10-CM | POA: Diagnosis not present

## 2016-04-13 DIAGNOSIS — Z01810 Encounter for preprocedural cardiovascular examination: Secondary | ICD-10-CM

## 2016-04-13 DIAGNOSIS — E118 Type 2 diabetes mellitus with unspecified complications: Secondary | ICD-10-CM | POA: Diagnosis not present

## 2016-04-13 DIAGNOSIS — D509 Iron deficiency anemia, unspecified: Secondary | ICD-10-CM | POA: Diagnosis not present

## 2016-04-14 ENCOUNTER — Encounter (HOSPITAL_COMMUNITY): Payer: Self-pay | Admitting: *Deleted

## 2016-04-14 NOTE — Progress Notes (Signed)
Pt denies SOB, chest pain, and being under the care of a cardiologist. Pt denies having a stress test and cardiac cath. Pt stated that his fasting BS usually ranges between 200-240. Pt  instructed to take 10 units of Levemir insulin ( half of scheduled dose) the night before procedure and 10 units( half) the morning of procedure. Pt instructed on diabetes protocol to check BS every 2 hours prior to procedure, interventions for a BS <70 and >220 and phone number to SS. Pt made aware to stop vitamins, fish oil, herbal medications and NSAID's..Pt verbalized understanding of all pre-op instructions. Please measure pt neck on DOS to complete STOP-BANG assessment.

## 2016-04-15 DIAGNOSIS — E118 Type 2 diabetes mellitus with unspecified complications: Secondary | ICD-10-CM | POA: Diagnosis not present

## 2016-04-15 DIAGNOSIS — D509 Iron deficiency anemia, unspecified: Secondary | ICD-10-CM | POA: Diagnosis not present

## 2016-04-15 DIAGNOSIS — N2581 Secondary hyperparathyroidism of renal origin: Secondary | ICD-10-CM | POA: Diagnosis not present

## 2016-04-15 DIAGNOSIS — Z23 Encounter for immunization: Secondary | ICD-10-CM | POA: Diagnosis not present

## 2016-04-15 DIAGNOSIS — N186 End stage renal disease: Secondary | ICD-10-CM | POA: Diagnosis not present

## 2016-04-15 DIAGNOSIS — D631 Anemia in chronic kidney disease: Secondary | ICD-10-CM | POA: Diagnosis not present

## 2016-04-16 ENCOUNTER — Encounter (HOSPITAL_COMMUNITY)
Admission: RE | Disposition: A | Discharge status: Home / Self Care | Payer: Self-pay | Source: Ambulatory Visit | Attending: Vascular Surgery

## 2016-04-16 ENCOUNTER — Ambulatory Visit (HOSPITAL_COMMUNITY)
Admission: RE | Admit: 2016-04-16 | Discharge: 2016-04-16 | Disposition: A | Discharge status: Home / Self Care | Payer: Medicare Other | Source: Ambulatory Visit | Attending: Vascular Surgery | Admitting: Vascular Surgery

## 2016-04-16 ENCOUNTER — Ambulatory Visit (HOSPITAL_COMMUNITY): Payer: Medicare Other | Admitting: Anesthesiology

## 2016-04-16 ENCOUNTER — Other Ambulatory Visit: Payer: Self-pay

## 2016-04-16 ENCOUNTER — Ambulatory Visit (HOSPITAL_COMMUNITY): Payer: Medicare Other | Admitting: Hematology & Oncology

## 2016-04-16 DIAGNOSIS — Z01810 Encounter for preprocedural cardiovascular examination: Secondary | ICD-10-CM

## 2016-04-16 DIAGNOSIS — I509 Heart failure, unspecified: Secondary | ICD-10-CM | POA: Insufficient documentation

## 2016-04-16 DIAGNOSIS — N186 End stage renal disease: Secondary | ICD-10-CM | POA: Insufficient documentation

## 2016-04-16 DIAGNOSIS — T82898S Other specified complication of vascular prosthetic devices, implants and grafts, sequela: Secondary | ICD-10-CM | POA: Diagnosis not present

## 2016-04-16 DIAGNOSIS — Z794 Long term (current) use of insulin: Secondary | ICD-10-CM | POA: Diagnosis not present

## 2016-04-16 DIAGNOSIS — Z992 Dependence on renal dialysis: Secondary | ICD-10-CM | POA: Diagnosis not present

## 2016-04-16 DIAGNOSIS — Z87891 Personal history of nicotine dependence: Secondary | ICD-10-CM | POA: Insufficient documentation

## 2016-04-16 DIAGNOSIS — I87301 Chronic venous hypertension (idiopathic) without complications of right lower extremity: Secondary | ICD-10-CM | POA: Insufficient documentation

## 2016-04-16 DIAGNOSIS — E1122 Type 2 diabetes mellitus with diabetic chronic kidney disease: Secondary | ICD-10-CM | POA: Diagnosis not present

## 2016-04-16 DIAGNOSIS — I132 Hypertensive heart and chronic kidney disease with heart failure and with stage 5 chronic kidney disease, or end stage renal disease: Secondary | ICD-10-CM | POA: Diagnosis not present

## 2016-04-16 DIAGNOSIS — E669 Obesity, unspecified: Secondary | ICD-10-CM | POA: Insufficient documentation

## 2016-04-16 HISTORY — PX: LIGATION OF ARTERIOVENOUS  FISTULA: SHX5948

## 2016-04-16 LAB — POCT I-STAT 4, (NA,K, GLUC, HGB,HCT)
Glucose, Bld: 261 mg/dL — ABNORMAL HIGH (ref 65–99)
HCT: 39 % (ref 39.0–52.0)
Hemoglobin: 13.3 g/dL (ref 13.0–17.0)
POTASSIUM: 2.8 mmol/L — AB (ref 3.5–5.1)
SODIUM: 133 mmol/L — AB (ref 135–145)

## 2016-04-16 LAB — GLUCOSE, CAPILLARY
Glucose-Capillary: 271 mg/dL — ABNORMAL HIGH (ref 65–99)
Glucose-Capillary: 252 mg/dL — ABNORMAL HIGH (ref 65–99)

## 2016-04-16 SURGERY — LIGATION OF ARTERIOVENOUS  FISTULA
Anesthesia: Monitor Anesthesia Care | Site: Arm Upper | Laterality: Right

## 2016-04-16 MED ORDER — SODIUM CHLORIDE 0.9 % IV SOLN: INTRAVENOUS | Status: DC

## 2016-04-16 MED ORDER — LIDOCAINE HCL (PF) 1 % IJ SOLN
INTRAMUSCULAR | Status: DC | PRN
  Administered 2016-04-16: 2 mL via INTRADERMAL

## 2016-04-16 MED ORDER — 0.9 % SODIUM CHLORIDE (POUR BTL) OPTIME
TOPICAL | Status: DC | PRN
  Administered 2016-04-16: 1000 mL

## 2016-04-16 MED ORDER — DEXTROSE 5 % IV SOLN
INTRAVENOUS | Status: AC
  Filled 2016-04-16: qty 1.5

## 2016-04-16 MED ORDER — HYDROMORPHONE HCL 1 MG/ML IJ SOLN: 0.2500 mg | INTRAMUSCULAR | Status: DC | PRN

## 2016-04-16 MED ORDER — ONDANSETRON HCL 4 MG/2ML IJ SOLN
INTRAMUSCULAR | Status: AC
  Filled 2016-04-16: qty 2

## 2016-04-16 MED ORDER — MIDAZOLAM HCL 5 MG/5ML IJ SOLN
INTRAMUSCULAR | Status: DC | PRN
  Administered 2016-04-16: 2 mg via INTRAVENOUS

## 2016-04-16 MED ORDER — MIDAZOLAM HCL 2 MG/2ML IJ SOLN
INTRAMUSCULAR | Status: AC
  Filled 2016-04-16: qty 2

## 2016-04-16 MED ORDER — PROPOFOL 500 MG/50ML IV EMUL
INTRAVENOUS | Status: DC | PRN
  Administered 2016-04-16: 70 ug/kg/min via INTRAVENOUS

## 2016-04-16 MED ORDER — SODIUM CHLORIDE 0.9 % IV SOLN
INTRAVENOUS | Status: DC
Start: 2016-04-16 — End: 2016-04-16
  Administered 2016-04-16: 11:00:00 via INTRAVENOUS

## 2016-04-16 MED ORDER — DEXTROSE 5 % IV SOLN
1.5000 g | INTRAVENOUS | Status: AC
  Administered 2016-04-16: 1.5 g via INTRAVENOUS

## 2016-04-16 MED ORDER — FENTANYL CITRATE (PF) 250 MCG/5ML IJ SOLN
INTRAMUSCULAR | Status: AC
  Filled 2016-04-16: qty 5

## 2016-04-16 MED ORDER — LIDOCAINE 2% (20 MG/ML) 5 ML SYRINGE
INTRAMUSCULAR | Status: AC
  Filled 2016-04-16: qty 15

## 2016-04-16 MED ORDER — FENTANYL CITRATE (PF) 100 MCG/2ML IJ SOLN
INTRAMUSCULAR | Status: DC | PRN
  Administered 2016-04-16: 25 ug via INTRAVENOUS

## 2016-04-16 MED ORDER — LIDOCAINE HCL (PF) 1 % IJ SOLN
INTRAMUSCULAR | Status: AC
  Filled 2016-04-16: qty 30

## 2016-04-16 MED ORDER — ARTIFICIAL TEARS OP OINT
TOPICAL_OINTMENT | OPHTHALMIC | Status: AC
  Filled 2016-04-16: qty 3.5

## 2016-04-16 MED ORDER — LIDOCAINE HCL (CARDIAC) 20 MG/ML IV SOLN
INTRAVENOUS | Status: DC | PRN
  Administered 2016-04-16: 40 mg via INTRATRACHEAL

## 2016-04-16 MED ORDER — CHLORHEXIDINE GLUCONATE CLOTH 2 % EX PADS: 6.0000 | MEDICATED_PAD | Freq: Once | CUTANEOUS | Status: DC

## 2016-04-16 MED ORDER — ACETAMINOPHEN 325 MG PO TABS: 325.0000 mg | ORAL_TABLET | Freq: Four times a day (QID) | ORAL | Status: DC | PRN

## 2016-04-16 SURGICAL SUPPLY — 26 items
CANISTER SUCTION 2500CC (MISCELLANEOUS) ×2 IMPLANT
CLIP TI MEDIUM 6 (CLIP) ×2 IMPLANT
CLIP TI WIDE RED SMALL 6 (CLIP) ×2 IMPLANT
ELECT REM PT RETURN 9FT ADLT (ELECTROSURGICAL) ×2
ELECTRODE REM PT RTRN 9FT ADLT (ELECTROSURGICAL) ×1 IMPLANT
GLOVE BIO SURGEON STRL SZ7.5 (GLOVE) ×2 IMPLANT
GLOVE BIOGEL PI IND STRL 6.5 (GLOVE) ×1 IMPLANT
GLOVE BIOGEL PI IND STRL 7.0 (GLOVE) ×1 IMPLANT
GLOVE BIOGEL PI IND STRL 8 (GLOVE) ×1 IMPLANT
GLOVE BIOGEL PI INDICATOR 6.5 (GLOVE) ×1
GLOVE BIOGEL PI INDICATOR 7.0 (GLOVE) ×1
GLOVE BIOGEL PI INDICATOR 8 (GLOVE) ×1
GOWN STRL REUS W/ TWL LRG LVL3 (GOWN DISPOSABLE) ×3 IMPLANT
GOWN STRL REUS W/TWL LRG LVL3 (GOWN DISPOSABLE) ×3
KIT BASIN OR (CUSTOM PROCEDURE TRAY) ×2 IMPLANT
KIT ROOM TURNOVER OR (KITS) ×2 IMPLANT
LIQUID BAND (GAUZE/BANDAGES/DRESSINGS) ×2 IMPLANT
NS IRRIG 1000ML POUR BTL (IV SOLUTION) ×2 IMPLANT
PACK CV ACCESS (CUSTOM PROCEDURE TRAY) ×2 IMPLANT
PAD ARMBOARD 7.5X6 YLW CONV (MISCELLANEOUS) ×4 IMPLANT
SUT PROLENE 6 0 CC (SUTURE) IMPLANT
SUT SILK 0 (SUTURE) IMPLANT
SUT VIC AB 3-0 SH 27 (SUTURE) ×1
SUT VIC AB 3-0 SH 27X BRD (SUTURE) ×1 IMPLANT
SUT VICRYL 4-0 PS2 18IN ABS (SUTURE) ×2 IMPLANT
UNDERPAD 30X30 INCONTINENT (UNDERPADS AND DIAPERS) ×2 IMPLANT

## 2016-04-16 NOTE — Anesthesia Postprocedure Evaluation (Signed)
Anesthesia Post Note  Patient: James [name]  Procedure(s) Performed: Procedure(s) (LRB): LIGATION OF ARTERIOVENOUS  FISTULA (Right)  Patient location during evaluation: PACU Anesthesia Type: MAC Level of consciousness: awake and alert Pain management: pain level controlled Vital Signs Assessment: post-procedure vital signs reviewed and stable Respiratory status: spontaneous breathing and respiratory function stable Cardiovascular status: stable Anesthetic complications: no    Last Vitals:  Filed Vitals:   04/16/16 1206 04/16/16 1207  BP:  97/55  Pulse:  70  Temp: 36.7 C   Resp:  16    Last Pain: There were no vitals filed for this visit.               Chisom Muntean DANIEL

## 2016-04-16 NOTE — H&P (View-Only) (Signed)
Call to see pt for right arm swelling.  Had fistulogram today which showed right central vein occlusion.  His right arm has been chronically swollen but he thinks this may be a little more swollen post procedure today.  On exam there is no hematoma adjacent to the puncture site.  Right arm diffusely swollen with venous collaterals, right hand warm, compartments to not feel full, motor sensory function of arm and hand intact  Pt with chronic arm swelling difficult to know if this is any worse from earlier today but does not appear to be acute hematoma or procedurally related  Will schedule pt for right AV fistula ligation this Friday  Pt to keep arm elevated as much as possible   Ruta Hinds, MD [facility]: [phone] Pager: [phone]'

## 2016-04-16 NOTE — Op Note (Signed)
Procedure: Ligation right upper arm AV fistula  Preoperative diagnosis: Venous hypertension new. Postoperative diagnosis: Same  Anesthesia: Local  Operative findings: Right upper arm AV fistula ligated with 0 silk tie  Operative details: After obtaining informed consent, the patient was in the operative. The patient placed in supine position operating table. Patient's entire right upper extremity is prepped and draped in usual sterile fashion. Local anesthesia was infiltrated in the mid upper arm and a transverse incision made in this location. The incision was carried through the subcutaneous tissues down to level of fistula. The fistula was proximal to 10 mm in diameter. It was dissected free circumferentially. It was then ligated with a 0 silk tie. Hemostasis was obtained. The subcutaneous tissues were reapproximated using a running 3-0 Vicryl suture. The skin was closed with a 4-0 Vicryl subcuticular stitch. The patient tolerated the procedure well and there were no complications. Instrument sponge and needle count was correct in the case. The patient was taken to recovery room in stable condition.  Ruta Hinds, MD [facility] Office: [phone] Pager: [phone]

## 2016-04-16 NOTE — Transfer of Care (Signed)
Immediate Anesthesia Transfer of Care Note  Patient: James [name]  Procedure(s) Performed: Procedure(s): LIGATION OF ARTERIOVENOUS  FISTULA (Right)  Patient Location: PACU  Anesthesia Type:MAC  Level of Consciousness: awake and alert   Airway & Oxygen Therapy: Patient Spontanous Breathing and Patient connected to face mask oxygen  Post-op Assessment: Report given to RN and Post -op Vital signs reviewed and stable  Post vital signs: Reviewed and stable  Last Vitals:  Filed Vitals:   04/16/16 1028  Pulse: 88  Temp: 36.9 C  Resp: 18    Last Pain: There were no vitals filed for this visit.       Complications: No apparent anesthesia complications

## 2016-04-16 NOTE — Interval H&P Note (Signed)
History and Physical Interval Note:  04/16/2016 10:59 AM  James [name]  has presented today for surgery, with the diagnosis of End Stage Renal Disease N18.6  The various methods of treatment have been discussed with the patient and family. After consideration of risks, benefits and other options for treatment, the patient has consented to  Procedure(s): LIGATION OF ARTERIOVENOUS  FISTULA (Right) as a surgical intervention .  The patient's history has been reviewed, patient examined, no change in status, stable for surgery.  I have reviewed the patient's chart and labs.  Questions were answered to the patient's satisfaction.     Ruta Hinds

## 2016-04-16 NOTE — Progress Notes (Signed)
Dr Tobias Alexander notified of K + of 2.8

## 2016-04-16 NOTE — Anesthesia Preprocedure Evaluation (Signed)
Anesthesia Evaluation  Patient identified by MRN, date of birth, ID band Patient awake    Reviewed: Allergy & Precautions, NPO status , Patient's Chart, lab work & pertinent test results  History of Anesthesia Complications Negative for: history of anesthetic complications  Airway Mallampati: II       Dental  (+) Edentulous Upper, Edentulous Lower   Pulmonary former smoker,    Pulmonary exam normal breath sounds clear to auscultation       Cardiovascular hypertension, Pt. on medications +CHF   Rhythm:Regular     Neuro/Psych negative neurological ROS  negative psych ROS   GI/Hepatic   Endo/Other  diabetes, Type 2  Renal/GU ESRF and DialysisRenal disease     Musculoskeletal   Abdominal (+) + obese,  Abdomen: soft.    Peds  Hematology   Anesthesia Other Findings   Reproductive/Obstetrics                             Anesthesia Physical  Anesthesia Plan  ASA: IV  Anesthesia Plan: MAC   Post-op Pain Management:    Induction: Intravenous  Airway Management Planned: Simple Face Mask  Additional Equipment:   Intra-op Plan:   Post-operative Plan:   Informed Consent: I have reviewed the patients History and Physical, chart, labs and discussed the procedure including the risks, benefits and alternatives for the proposed anesthesia with the patient or authorized representative who has indicated his/her understanding and acceptance.   Dental advisory given  Plan Discussed with: CRNA, Anesthesiologist and Surgeon  Anesthesia Plan Comments: (K=2.8, local w/o sedation)        Anesthesia Quick Evaluation

## 2016-04-16 NOTE — Anesthesia Procedure Notes (Signed)
Procedure Name: MAC Date/Time: 04/16/2016 11:27 AM Performed by: [place] Pink Pre-anesthesia Checklist: Patient identified, Emergency Drugs available, Suction available, Patient being monitored and Timeout performed Patient Re-evaluated:Patient Re-evaluated prior to inductionOxygen Delivery Method: Simple face mask Preoxygenation: Pre-oxygenation with 100% oxygen Intubation Type: IV induction Number of attempts: 1 Placement Confirmation: positive ETCO2 Dental Injury: Teeth and Oropharynx as per pre-operative assessment

## 2016-04-17 ENCOUNTER — Telehealth: Payer: Self-pay | Admitting: Vascular Surgery

## 2016-04-17 DIAGNOSIS — Z23 Encounter for immunization: Secondary | ICD-10-CM | POA: Diagnosis not present

## 2016-04-17 DIAGNOSIS — N186 End stage renal disease: Secondary | ICD-10-CM | POA: Diagnosis not present

## 2016-04-17 DIAGNOSIS — D631 Anemia in chronic kidney disease: Secondary | ICD-10-CM | POA: Diagnosis not present

## 2016-04-17 DIAGNOSIS — E118 Type 2 diabetes mellitus with unspecified complications: Secondary | ICD-10-CM | POA: Diagnosis not present

## 2016-04-17 DIAGNOSIS — N2581 Secondary hyperparathyroidism of renal origin: Secondary | ICD-10-CM | POA: Diagnosis not present

## 2016-04-17 DIAGNOSIS — D509 Iron deficiency anemia, unspecified: Secondary | ICD-10-CM | POA: Diagnosis not present

## 2016-04-17 NOTE — Telephone Encounter (Signed)
-----   Message from Denman George, RN sent at 04/16/2016  1:11 PM EDT ----- Regarding: needs 1 mo. f/u with Dr. Oneida Alar with left arm vein map/discuss new access   ----- Message -----    From: Elam Dutch, MD    Sent: 04/16/2016  12:16 PM      To: [facility]vs Charge Pool  Ligation right arm fistula Nurse asst  Needs follow up with left arm vein map in a month to discuss new access  [employer]

## 2016-04-17 NOTE — Telephone Encounter (Signed)
Sched lab 5/26 at 2 and MD at 6/8 at 10:15. Lm on hm# to inform pt.

## 2016-04-19 ENCOUNTER — Encounter (HOSPITAL_COMMUNITY): Payer: Self-pay | Admitting: Vascular Surgery

## 2016-04-19 NOTE — Progress Notes (Signed)
This encounter was created in error - please disregard.

## 2016-04-20 DIAGNOSIS — N2581 Secondary hyperparathyroidism of renal origin: Secondary | ICD-10-CM | POA: Diagnosis not present

## 2016-04-20 DIAGNOSIS — N186 End stage renal disease: Secondary | ICD-10-CM | POA: Diagnosis not present

## 2016-04-20 DIAGNOSIS — Z23 Encounter for immunization: Secondary | ICD-10-CM | POA: Diagnosis not present

## 2016-04-20 DIAGNOSIS — D631 Anemia in chronic kidney disease: Secondary | ICD-10-CM | POA: Diagnosis not present

## 2016-04-20 DIAGNOSIS — E118 Type 2 diabetes mellitus with unspecified complications: Secondary | ICD-10-CM | POA: Diagnosis not present

## 2016-04-20 DIAGNOSIS — D509 Iron deficiency anemia, unspecified: Secondary | ICD-10-CM | POA: Diagnosis not present

## 2016-04-22 DIAGNOSIS — Z23 Encounter for immunization: Secondary | ICD-10-CM | POA: Diagnosis not present

## 2016-04-22 DIAGNOSIS — D509 Iron deficiency anemia, unspecified: Secondary | ICD-10-CM | POA: Diagnosis not present

## 2016-04-22 DIAGNOSIS — D631 Anemia in chronic kidney disease: Secondary | ICD-10-CM | POA: Diagnosis not present

## 2016-04-22 DIAGNOSIS — N2581 Secondary hyperparathyroidism of renal origin: Secondary | ICD-10-CM | POA: Diagnosis not present

## 2016-04-22 DIAGNOSIS — N186 End stage renal disease: Secondary | ICD-10-CM | POA: Diagnosis not present

## 2016-04-22 DIAGNOSIS — E118 Type 2 diabetes mellitus with unspecified complications: Secondary | ICD-10-CM | POA: Diagnosis not present

## 2016-04-24 DIAGNOSIS — E118 Type 2 diabetes mellitus with unspecified complications: Secondary | ICD-10-CM | POA: Diagnosis not present

## 2016-04-24 DIAGNOSIS — Z23 Encounter for immunization: Secondary | ICD-10-CM | POA: Diagnosis not present

## 2016-04-24 DIAGNOSIS — N186 End stage renal disease: Secondary | ICD-10-CM | POA: Diagnosis not present

## 2016-04-24 DIAGNOSIS — D509 Iron deficiency anemia, unspecified: Secondary | ICD-10-CM | POA: Diagnosis not present

## 2016-04-24 DIAGNOSIS — N2581 Secondary hyperparathyroidism of renal origin: Secondary | ICD-10-CM | POA: Diagnosis not present

## 2016-04-24 DIAGNOSIS — D631 Anemia in chronic kidney disease: Secondary | ICD-10-CM | POA: Diagnosis not present

## 2016-04-27 DIAGNOSIS — E118 Type 2 diabetes mellitus with unspecified complications: Secondary | ICD-10-CM | POA: Diagnosis not present

## 2016-04-27 DIAGNOSIS — N186 End stage renal disease: Secondary | ICD-10-CM | POA: Diagnosis not present

## 2016-04-27 DIAGNOSIS — N2581 Secondary hyperparathyroidism of renal origin: Secondary | ICD-10-CM | POA: Diagnosis not present

## 2016-04-27 DIAGNOSIS — D631 Anemia in chronic kidney disease: Secondary | ICD-10-CM | POA: Diagnosis not present

## 2016-04-27 DIAGNOSIS — D509 Iron deficiency anemia, unspecified: Secondary | ICD-10-CM | POA: Diagnosis not present

## 2016-04-27 DIAGNOSIS — Z23 Encounter for immunization: Secondary | ICD-10-CM | POA: Diagnosis not present

## 2016-04-29 DIAGNOSIS — Z23 Encounter for immunization: Secondary | ICD-10-CM | POA: Diagnosis not present

## 2016-04-29 DIAGNOSIS — E118 Type 2 diabetes mellitus with unspecified complications: Secondary | ICD-10-CM | POA: Diagnosis not present

## 2016-04-29 DIAGNOSIS — D509 Iron deficiency anemia, unspecified: Secondary | ICD-10-CM | POA: Diagnosis not present

## 2016-04-29 DIAGNOSIS — N186 End stage renal disease: Secondary | ICD-10-CM | POA: Diagnosis not present

## 2016-04-29 DIAGNOSIS — D631 Anemia in chronic kidney disease: Secondary | ICD-10-CM | POA: Diagnosis not present

## 2016-04-29 DIAGNOSIS — N2581 Secondary hyperparathyroidism of renal origin: Secondary | ICD-10-CM | POA: Diagnosis not present

## 2016-04-30 ENCOUNTER — Ambulatory Visit (HOSPITAL_COMMUNITY): Payer: Medicare Other | Attending: Vascular Surgery

## 2016-05-01 DIAGNOSIS — Z23 Encounter for immunization: Secondary | ICD-10-CM | POA: Diagnosis not present

## 2016-05-01 DIAGNOSIS — N2581 Secondary hyperparathyroidism of renal origin: Secondary | ICD-10-CM | POA: Diagnosis not present

## 2016-05-01 DIAGNOSIS — D631 Anemia in chronic kidney disease: Secondary | ICD-10-CM | POA: Diagnosis not present

## 2016-05-01 DIAGNOSIS — D509 Iron deficiency anemia, unspecified: Secondary | ICD-10-CM | POA: Diagnosis not present

## 2016-05-01 DIAGNOSIS — E118 Type 2 diabetes mellitus with unspecified complications: Secondary | ICD-10-CM | POA: Diagnosis not present

## 2016-05-01 DIAGNOSIS — N186 End stage renal disease: Secondary | ICD-10-CM | POA: Diagnosis not present

## 2016-05-04 DIAGNOSIS — N186 End stage renal disease: Secondary | ICD-10-CM | POA: Diagnosis not present

## 2016-05-04 DIAGNOSIS — E118 Type 2 diabetes mellitus with unspecified complications: Secondary | ICD-10-CM | POA: Diagnosis not present

## 2016-05-04 DIAGNOSIS — N2581 Secondary hyperparathyroidism of renal origin: Secondary | ICD-10-CM | POA: Diagnosis not present

## 2016-05-04 DIAGNOSIS — D509 Iron deficiency anemia, unspecified: Secondary | ICD-10-CM | POA: Diagnosis not present

## 2016-05-04 DIAGNOSIS — D631 Anemia in chronic kidney disease: Secondary | ICD-10-CM | POA: Diagnosis not present

## 2016-05-04 DIAGNOSIS — Z23 Encounter for immunization: Secondary | ICD-10-CM | POA: Diagnosis not present

## 2016-05-05 DIAGNOSIS — N041 Nephrotic syndrome with focal and segmental glomerular lesions: Secondary | ICD-10-CM | POA: Diagnosis not present

## 2016-05-05 DIAGNOSIS — N186 End stage renal disease: Secondary | ICD-10-CM | POA: Diagnosis not present

## 2016-05-05 DIAGNOSIS — Z992 Dependence on renal dialysis: Secondary | ICD-10-CM | POA: Diagnosis not present

## 2016-05-06 DIAGNOSIS — N2581 Secondary hyperparathyroidism of renal origin: Secondary | ICD-10-CM | POA: Diagnosis not present

## 2016-05-06 DIAGNOSIS — E118 Type 2 diabetes mellitus with unspecified complications: Secondary | ICD-10-CM | POA: Diagnosis not present

## 2016-05-06 DIAGNOSIS — N186 End stage renal disease: Secondary | ICD-10-CM | POA: Diagnosis not present

## 2016-05-06 DIAGNOSIS — D631 Anemia in chronic kidney disease: Secondary | ICD-10-CM | POA: Diagnosis not present

## 2016-05-08 DIAGNOSIS — D631 Anemia in chronic kidney disease: Secondary | ICD-10-CM | POA: Diagnosis not present

## 2016-05-08 DIAGNOSIS — N2581 Secondary hyperparathyroidism of renal origin: Secondary | ICD-10-CM | POA: Diagnosis not present

## 2016-05-08 DIAGNOSIS — E118 Type 2 diabetes mellitus with unspecified complications: Secondary | ICD-10-CM | POA: Diagnosis not present

## 2016-05-08 DIAGNOSIS — N186 End stage renal disease: Secondary | ICD-10-CM | POA: Diagnosis not present

## 2016-05-11 DIAGNOSIS — N186 End stage renal disease: Secondary | ICD-10-CM | POA: Diagnosis not present

## 2016-05-11 DIAGNOSIS — E118 Type 2 diabetes mellitus with unspecified complications: Secondary | ICD-10-CM | POA: Diagnosis not present

## 2016-05-11 DIAGNOSIS — N2581 Secondary hyperparathyroidism of renal origin: Secondary | ICD-10-CM | POA: Diagnosis not present

## 2016-05-11 DIAGNOSIS — D631 Anemia in chronic kidney disease: Secondary | ICD-10-CM | POA: Diagnosis not present

## 2016-05-13 ENCOUNTER — Encounter: Payer: Medicare Other | Admitting: Vascular Surgery

## 2016-05-13 DIAGNOSIS — E118 Type 2 diabetes mellitus with unspecified complications: Secondary | ICD-10-CM | POA: Diagnosis not present

## 2016-05-13 DIAGNOSIS — N2581 Secondary hyperparathyroidism of renal origin: Secondary | ICD-10-CM | POA: Diagnosis not present

## 2016-05-13 DIAGNOSIS — N186 End stage renal disease: Secondary | ICD-10-CM | POA: Diagnosis not present

## 2016-05-13 DIAGNOSIS — D631 Anemia in chronic kidney disease: Secondary | ICD-10-CM | POA: Diagnosis not present

## 2016-05-14 ENCOUNTER — Encounter: Payer: Medicare Other | Admitting: Vascular Surgery

## 2016-05-15 DIAGNOSIS — D631 Anemia in chronic kidney disease: Secondary | ICD-10-CM | POA: Diagnosis not present

## 2016-05-15 DIAGNOSIS — N2581 Secondary hyperparathyroidism of renal origin: Secondary | ICD-10-CM | POA: Diagnosis not present

## 2016-05-15 DIAGNOSIS — N186 End stage renal disease: Secondary | ICD-10-CM | POA: Diagnosis not present

## 2016-05-15 DIAGNOSIS — E118 Type 2 diabetes mellitus with unspecified complications: Secondary | ICD-10-CM | POA: Diagnosis not present

## 2016-05-18 DIAGNOSIS — N2581 Secondary hyperparathyroidism of renal origin: Secondary | ICD-10-CM | POA: Diagnosis not present

## 2016-05-18 DIAGNOSIS — E118 Type 2 diabetes mellitus with unspecified complications: Secondary | ICD-10-CM | POA: Diagnosis not present

## 2016-05-18 DIAGNOSIS — N186 End stage renal disease: Secondary | ICD-10-CM | POA: Diagnosis not present

## 2016-05-18 DIAGNOSIS — D631 Anemia in chronic kidney disease: Secondary | ICD-10-CM | POA: Diagnosis not present

## 2016-05-20 DIAGNOSIS — N186 End stage renal disease: Secondary | ICD-10-CM | POA: Diagnosis not present

## 2016-05-20 DIAGNOSIS — D631 Anemia in chronic kidney disease: Secondary | ICD-10-CM | POA: Diagnosis not present

## 2016-05-20 DIAGNOSIS — E118 Type 2 diabetes mellitus with unspecified complications: Secondary | ICD-10-CM | POA: Diagnosis not present

## 2016-05-20 DIAGNOSIS — N2581 Secondary hyperparathyroidism of renal origin: Secondary | ICD-10-CM | POA: Diagnosis not present

## 2016-05-22 DIAGNOSIS — N186 End stage renal disease: Secondary | ICD-10-CM | POA: Diagnosis not present

## 2016-05-22 DIAGNOSIS — D631 Anemia in chronic kidney disease: Secondary | ICD-10-CM | POA: Diagnosis not present

## 2016-05-22 DIAGNOSIS — N2581 Secondary hyperparathyroidism of renal origin: Secondary | ICD-10-CM | POA: Diagnosis not present

## 2016-05-22 DIAGNOSIS — E118 Type 2 diabetes mellitus with unspecified complications: Secondary | ICD-10-CM | POA: Diagnosis not present

## 2016-05-25 DIAGNOSIS — D631 Anemia in chronic kidney disease: Secondary | ICD-10-CM | POA: Diagnosis not present

## 2016-05-25 DIAGNOSIS — N2581 Secondary hyperparathyroidism of renal origin: Secondary | ICD-10-CM | POA: Diagnosis not present

## 2016-05-25 DIAGNOSIS — N186 End stage renal disease: Secondary | ICD-10-CM | POA: Diagnosis not present

## 2016-05-25 DIAGNOSIS — E118 Type 2 diabetes mellitus with unspecified complications: Secondary | ICD-10-CM | POA: Diagnosis not present

## 2016-05-27 ENCOUNTER — Encounter: Payer: Self-pay | Admitting: Vascular Surgery

## 2016-05-27 DIAGNOSIS — N2581 Secondary hyperparathyroidism of renal origin: Secondary | ICD-10-CM | POA: Diagnosis not present

## 2016-05-27 DIAGNOSIS — N186 End stage renal disease: Secondary | ICD-10-CM | POA: Diagnosis not present

## 2016-05-27 DIAGNOSIS — D631 Anemia in chronic kidney disease: Secondary | ICD-10-CM | POA: Diagnosis not present

## 2016-05-27 DIAGNOSIS — E118 Type 2 diabetes mellitus with unspecified complications: Secondary | ICD-10-CM | POA: Diagnosis not present

## 2016-05-28 ENCOUNTER — Ambulatory Visit (HOSPITAL_COMMUNITY)
Admission: RE | Admit: 2016-05-28 | Discharge: 2016-05-28 | Disposition: A | Discharge status: Home / Self Care | Payer: Medicare Other | Source: Ambulatory Visit | Attending: Vascular Surgery | Admitting: Vascular Surgery

## 2016-05-28 DIAGNOSIS — E1122 Type 2 diabetes mellitus with diabetic chronic kidney disease: Secondary | ICD-10-CM | POA: Diagnosis not present

## 2016-05-28 DIAGNOSIS — I509 Heart failure, unspecified: Secondary | ICD-10-CM | POA: Insufficient documentation

## 2016-05-28 DIAGNOSIS — I132 Hypertensive heart and chronic kidney disease with heart failure and with stage 5 chronic kidney disease, or end stage renal disease: Secondary | ICD-10-CM | POA: Diagnosis not present

## 2016-05-28 DIAGNOSIS — Z01810 Encounter for preprocedural cardiovascular examination: Secondary | ICD-10-CM | POA: Diagnosis not present

## 2016-05-28 DIAGNOSIS — N186 End stage renal disease: Secondary | ICD-10-CM

## 2016-05-29 DIAGNOSIS — D631 Anemia in chronic kidney disease: Secondary | ICD-10-CM | POA: Diagnosis not present

## 2016-05-29 DIAGNOSIS — N186 End stage renal disease: Secondary | ICD-10-CM | POA: Diagnosis not present

## 2016-05-29 DIAGNOSIS — E118 Type 2 diabetes mellitus with unspecified complications: Secondary | ICD-10-CM | POA: Diagnosis not present

## 2016-05-29 DIAGNOSIS — N2581 Secondary hyperparathyroidism of renal origin: Secondary | ICD-10-CM | POA: Diagnosis not present

## 2016-06-01 DIAGNOSIS — E118 Type 2 diabetes mellitus with unspecified complications: Secondary | ICD-10-CM | POA: Diagnosis not present

## 2016-06-01 DIAGNOSIS — N186 End stage renal disease: Secondary | ICD-10-CM | POA: Diagnosis not present

## 2016-06-01 DIAGNOSIS — D631 Anemia in chronic kidney disease: Secondary | ICD-10-CM | POA: Diagnosis not present

## 2016-06-01 DIAGNOSIS — N2581 Secondary hyperparathyroidism of renal origin: Secondary | ICD-10-CM | POA: Diagnosis not present

## 2016-06-03 ENCOUNTER — Ambulatory Visit (INDEPENDENT_AMBULATORY_CARE_PROVIDER_SITE_OTHER): Payer: Self-pay | Admitting: Vascular Surgery

## 2016-06-03 ENCOUNTER — Encounter: Payer: Self-pay | Admitting: Vascular Surgery

## 2016-06-03 ENCOUNTER — Other Ambulatory Visit: Payer: Self-pay

## 2016-06-03 VITALS — BP 173/100 | HR 82 | Ht 65.0 in | Wt 212.8 lb

## 2016-06-03 DIAGNOSIS — N186 End stage renal disease: Secondary | ICD-10-CM

## 2016-06-03 DIAGNOSIS — E118 Type 2 diabetes mellitus with unspecified complications: Secondary | ICD-10-CM | POA: Diagnosis not present

## 2016-06-03 DIAGNOSIS — Z992 Dependence on renal dialysis: Secondary | ICD-10-CM

## 2016-06-03 DIAGNOSIS — D631 Anemia in chronic kidney disease: Secondary | ICD-10-CM | POA: Diagnosis not present

## 2016-06-03 DIAGNOSIS — N2581 Secondary hyperparathyroidism of renal origin: Secondary | ICD-10-CM | POA: Diagnosis not present

## 2016-06-03 NOTE — Progress Notes (Signed)
Patient is a 60 year old male who returns for postoperative follow-up today. He recently underwent ligation of a right brachiocephalic AV fistula for venous hypertension. He reports that the swelling in his right upper extremity has nearly returned to baseline. He has about 5% residual swelling. He currently is dialyzing via a right-sided catheter. He has had a previous left radiocephalic AV fistula. He underwent vein mapping of his left upper extremity recently which shows a good quality left upper arm cephalic vein. He has previously had a central venogram which showed his left central veins were patent. This was October 2016.  Past Surgical History  Procedure Laterality Date  . Colonoscopy    . Av fistula placement Left 04/08/2015    Procedure: LEFT RADIOCEPHALIC ARTERIOVENOUS (AV) FISTULA CREATION;  Surgeon: Elam Dutch, MD;  Location: [facility];  Service: Vascular;  Laterality: Left;  . Peripheral vascular catheterization N/A 07/04/2015    Procedure: Fistulagram;  Surgeon: Elam Dutch, MD;  Location: [facility];  Service: Cardiovascular;  Laterality: N/A;  . Ligation of competing branches of arteriovenous fistula Left 07/16/2015    Procedure: LIGATION OF SIDE BRANCHES OF ARTERIOVENOUS FISTULA LEFT ARM;  Surgeon: Rosetta Posner, MD;  Location: [facility];  Service: Vascular;  Laterality: Left;  . Peripheral vascular catheterization N/A 09/08/2015    Procedure: Fistulagram;  Surgeon: Conrad Tripoli, MD;  Location: [facility];  Service: Cardiovascular;  Laterality: N/A;  . Ligation of arteriovenous  fistula Left 09/15/2015    Procedure: LIGATION OF RADIOCEPHALIC ARTERIOVENOUS FISTULA - LEFT ARM;  Surgeon: Conrad Navajo, MD;  Location: [facility];  Service: Vascular;  Laterality: Left;  . Av fistula placement Right 09/15/2015    Procedure: RADIOCEPHALIC VERSUS BRACHIOCEPHALIC ARTERIOVENOUS (AV) FISTULA CREATION - RIGHT ARM;  Surgeon: Conrad Princeton Meadows, MD;  Location: [facility];  Service: Vascular;   Laterality: Right;  . Peripheral vascular catheterization Right 02/23/2016    Procedure: Fistulagram;  Surgeon: Conrad Riddleville, MD;  Location: [facility];  Service: Cardiovascular;  Laterality: Right;  . Revison of arteriovenous fistula Right 03/10/2016    Procedure: RIGHT CEPHALIC VEIN TURNDOWN;  Surgeon: Conrad Hackberry, MD;  Location: [facility];  Service: Vascular;  Laterality: Right;  . Peripheral vascular catheterization N/A 04/12/2016    Procedure: Fistulagram;  Surgeon: Conrad Parkwood, MD;  Location: [facility];  Service: Cardiovascular;  Laterality: N/A;  . Ligation of arteriovenous  fistula Right 04/16/2016    Procedure: LIGATION OF ARTERIOVENOUS  FISTULA;  Surgeon: Elam Dutch, MD;  [facility];  Service: Vascular;  Laterality: Right;     Physical exam:  Filed Vitals:   06/03/16 0830 06/03/16 0831  BP: 178/103 173/100  Pulse: 82   Height: 5\' 5"  (1.651 m)   Weight: 212 lb 12.8 oz (96.525 kg)   SpO2: 97%     Right upper extremity: Well-healed upper arm incision pulsatile proximal 5 cm of fistula no audible bruit on the right side catheter  Left upper extremity: 2+ brachial radial pulse  Assessment: Patient needs long-term hemodialysis access. Recent vein mapping ultrasound shows a left upper arm cephalic vein is of good quality.  Plan: Left brachiocephalic AV fistula [id number]. Risks benefits possible claudications and procedure details were discussed with the patient today including not limited to bleeding infection ischemic steal need for further procedures. He understands and agrees to proceed.  Ruta Hinds, MD [facility] [place] Office: [phone] Pager: [phone]

## 2016-06-04 ENCOUNTER — Encounter (HOSPITAL_COMMUNITY): Payer: Self-pay | Admitting: *Deleted

## 2016-06-04 DIAGNOSIS — N186 End stage renal disease: Secondary | ICD-10-CM | POA: Diagnosis not present

## 2016-06-04 DIAGNOSIS — N041 Nephrotic syndrome with focal and segmental glomerular lesions: Secondary | ICD-10-CM | POA: Diagnosis not present

## 2016-06-04 DIAGNOSIS — Z992 Dependence on renal dialysis: Secondary | ICD-10-CM | POA: Diagnosis not present

## 2016-06-04 NOTE — Progress Notes (Signed)
Mr Ensz reports that CBGs have been running around 250. I encouraged patient to check CBG at least 4 times a day until Monday.  I instructed patient to take 1/2 dose of Levemir on Sunday evening and Monday AM.  I instructed patient to check CBG the am of surgery to check CBG and if it is less than 70 to treat it with Glucose Gel, Glucose tablets or 1/2 cup of clear juice like apple juice or cranberry juice, or 1/2 cup of regular soda. (not cream soda). I instructed patient to recheck CBG in 15 minutes and if CBG is not greater than 70, to  Call [phone] (pre- op). If it is before pre-op opens to retreat as before and recheck CBG in 15 minutes. I told patient to make note of time that liquid is taken and amount, that surgical time may have to be adjusted.

## 2016-06-05 DIAGNOSIS — N2581 Secondary hyperparathyroidism of renal origin: Secondary | ICD-10-CM | POA: Diagnosis not present

## 2016-06-05 DIAGNOSIS — D631 Anemia in chronic kidney disease: Secondary | ICD-10-CM | POA: Diagnosis not present

## 2016-06-05 DIAGNOSIS — E118 Type 2 diabetes mellitus with unspecified complications: Secondary | ICD-10-CM | POA: Diagnosis not present

## 2016-06-05 DIAGNOSIS — N186 End stage renal disease: Secondary | ICD-10-CM | POA: Diagnosis not present

## 2016-06-06 MED ORDER — DEXTROSE 5 % IV SOLN
1.5000 g | INTRAVENOUS | Status: AC
  Administered 2016-06-07: 1.5 g via INTRAVENOUS
  Filled 2016-06-06: qty 1.5

## 2016-06-06 MED ORDER — SODIUM CHLORIDE 0.9 % IV SOLN: INTRAVENOUS | Status: DC

## 2016-06-07 ENCOUNTER — Ambulatory Visit (HOSPITAL_COMMUNITY): Payer: Medicare Other | Admitting: Anesthesiology

## 2016-06-07 ENCOUNTER — Ambulatory Visit (HOSPITAL_COMMUNITY)
Admission: RE | Admit: 2016-06-07 | Discharge: 2016-06-07 | Disposition: A | Discharge status: Home / Self Care | Payer: Medicare Other | Source: Ambulatory Visit | Attending: Vascular Surgery | Admitting: Vascular Surgery

## 2016-06-07 ENCOUNTER — Encounter (HOSPITAL_COMMUNITY)
Admission: RE | Disposition: A | Discharge status: Home / Self Care | Payer: Self-pay | Source: Ambulatory Visit | Attending: Vascular Surgery

## 2016-06-07 ENCOUNTER — Other Ambulatory Visit: Payer: Self-pay

## 2016-06-07 DIAGNOSIS — N186 End stage renal disease: Secondary | ICD-10-CM | POA: Insufficient documentation

## 2016-06-07 DIAGNOSIS — J45909 Unspecified asthma, uncomplicated: Secondary | ICD-10-CM | POA: Diagnosis not present

## 2016-06-07 DIAGNOSIS — Z87891 Personal history of nicotine dependence: Secondary | ICD-10-CM | POA: Insufficient documentation

## 2016-06-07 DIAGNOSIS — E1122 Type 2 diabetes mellitus with diabetic chronic kidney disease: Secondary | ICD-10-CM | POA: Insufficient documentation

## 2016-06-07 DIAGNOSIS — I132 Hypertensive heart and chronic kidney disease with heart failure and with stage 5 chronic kidney disease, or end stage renal disease: Secondary | ICD-10-CM | POA: Insufficient documentation

## 2016-06-07 DIAGNOSIS — N185 Chronic kidney disease, stage 5: Secondary | ICD-10-CM | POA: Diagnosis not present

## 2016-06-07 DIAGNOSIS — I509 Heart failure, unspecified: Secondary | ICD-10-CM | POA: Diagnosis not present

## 2016-06-07 DIAGNOSIS — I120 Hypertensive chronic kidney disease with stage 5 chronic kidney disease or end stage renal disease: Secondary | ICD-10-CM | POA: Diagnosis present

## 2016-06-07 DIAGNOSIS — Z48812 Encounter for surgical aftercare following surgery on the circulatory system: Secondary | ICD-10-CM

## 2016-06-07 HISTORY — PX: AV FISTULA PLACEMENT: SHX1204

## 2016-06-07 LAB — POCT I-STAT 4, (NA,K, GLUC, HGB,HCT)
Glucose, Bld: 230 mg/dL — ABNORMAL HIGH (ref 65–99)
HCT: 36 % — ABNORMAL LOW (ref 39.0–52.0)
Hemoglobin: 12.2 g/dL — ABNORMAL LOW (ref 13.0–17.0)
Potassium: 3.2 mmol/L — ABNORMAL LOW (ref 3.5–5.1)
SODIUM: 133 mmol/L — AB (ref 135–145)

## 2016-06-07 LAB — GLUCOSE, CAPILLARY: GLUCOSE-CAPILLARY: 205 mg/dL — AB (ref 65–99)

## 2016-06-07 SURGERY — ARTERIOVENOUS (AV) FISTULA CREATION
Anesthesia: Monitor Anesthesia Care | Site: Arm Upper | Laterality: Left

## 2016-06-07 MED ORDER — ONDANSETRON HCL 4 MG/2ML IJ SOLN: 4.0000 mg | Freq: Once | INTRAMUSCULAR | Status: DC | PRN

## 2016-06-07 MED ORDER — PHENYLEPHRINE HCL 10 MG/ML IJ SOLN
INTRAMUSCULAR | Status: DC | PRN
  Administered 2016-06-07 (×4): 80 ug via INTRAVENOUS
  Administered 2016-06-07: 40 ug via INTRAVENOUS

## 2016-06-07 MED ORDER — PROPOFOL 10 MG/ML IV BOLUS
INTRAVENOUS | Status: DC | PRN
  Administered 2016-06-07: 20 mg via INTRAVENOUS

## 2016-06-07 MED ORDER — PROPOFOL 500 MG/50ML IV EMUL
INTRAVENOUS | Status: DC | PRN
  Administered 2016-06-07: 50 ug/kg/min via INTRAVENOUS

## 2016-06-07 MED ORDER — FENTANYL CITRATE (PF) 100 MCG/2ML IJ SOLN
INTRAMUSCULAR | Status: DC | PRN
  Administered 2016-06-07 (×2): 25 ug via INTRAVENOUS
  Administered 2016-06-07: 50 ug via INTRAVENOUS

## 2016-06-07 MED ORDER — LIDOCAINE HCL (PF) 1 % IJ SOLN
INTRAMUSCULAR | Status: AC
  Filled 2016-06-07: qty 30

## 2016-06-07 MED ORDER — THROMBIN 20000 UNITS EX SOLR
CUTANEOUS | Status: AC
  Filled 2016-06-07: qty 20000

## 2016-06-07 MED ORDER — FENTANYL CITRATE (PF) 250 MCG/5ML IJ SOLN
INTRAMUSCULAR | Status: AC
  Filled 2016-06-07: qty 5

## 2016-06-07 MED ORDER — MIDAZOLAM HCL 2 MG/2ML IJ SOLN
INTRAMUSCULAR | Status: AC
  Filled 2016-06-07: qty 2

## 2016-06-07 MED ORDER — HEPARIN SODIUM (PORCINE) 1000 UNIT/ML IJ SOLN
INTRAMUSCULAR | Status: DC | PRN
  Administered 2016-06-07: 3000 [IU] via INTRAVENOUS

## 2016-06-07 MED ORDER — OXYCODONE-ACETAMINOPHEN 5-325 MG PO TABS: 1.0000 | ORAL_TABLET | Freq: Four times a day (QID) | ORAL | Status: DC | PRN

## 2016-06-07 MED ORDER — PROPOFOL 10 MG/ML IV BOLUS
INTRAVENOUS | Status: AC
  Filled 2016-06-07: qty 20

## 2016-06-07 MED ORDER — MIDAZOLAM HCL 5 MG/5ML IJ SOLN
INTRAMUSCULAR | Status: DC | PRN
  Administered 2016-06-07: 2 mg via INTRAVENOUS

## 2016-06-07 MED ORDER — SODIUM CHLORIDE 0.9 % IV SOLN
INTRAVENOUS | Status: DC | PRN
  Administered 2016-06-07: 500 mL

## 2016-06-07 MED ORDER — 0.9 % SODIUM CHLORIDE (POUR BTL) OPTIME
TOPICAL | Status: DC | PRN
  Administered 2016-06-07: 1000 mL

## 2016-06-07 MED ORDER — CHLORHEXIDINE GLUCONATE CLOTH 2 % EX PADS: 6.0000 | MEDICATED_PAD | Freq: Once | CUTANEOUS | Status: DC

## 2016-06-07 MED ORDER — FENTANYL CITRATE (PF) 100 MCG/2ML IJ SOLN: 25.0000 ug | INTRAMUSCULAR | Status: DC | PRN

## 2016-06-07 MED ORDER — SODIUM CHLORIDE 0.9 % IV SOLN
INTRAVENOUS | Status: DC | PRN
  Administered 2016-06-07 (×2): via INTRAVENOUS

## 2016-06-07 MED ORDER — LIDOCAINE HCL (PF) 1 % IJ SOLN
INTRAMUSCULAR | Status: DC | PRN
  Administered 2016-06-07: 27 mL

## 2016-06-07 SURGICAL SUPPLY — 35 items
ARMBAND PINK RESTRICT EXTREMIT (MISCELLANEOUS) ×2 IMPLANT
CANISTER SUCTION 2500CC (MISCELLANEOUS) ×2 IMPLANT
CANNULA VESSEL 3MM 2 BLNT TIP (CANNULA) ×2 IMPLANT
CLIP TI MEDIUM 6 (CLIP) ×2 IMPLANT
CLIP TI WIDE RED SMALL 6 (CLIP) ×2 IMPLANT
COVER PROBE W GEL 5X96 (DRAPES) IMPLANT
DECANTER SPIKE VIAL GLASS SM (MISCELLANEOUS) IMPLANT
DRAIN PENROSE 1/4X12 LTX STRL (WOUND CARE) ×2 IMPLANT
ELECT REM PT RETURN 9FT ADLT (ELECTROSURGICAL) ×2
ELECTRODE REM PT RTRN 9FT ADLT (ELECTROSURGICAL) ×1 IMPLANT
GLOVE BIO SURGEON STRL SZ7.5 (GLOVE) ×2 IMPLANT
GLOVE BIOGEL PI IND STRL 6.5 (GLOVE) ×2 IMPLANT
GLOVE BIOGEL PI IND STRL 7.0 (GLOVE) ×1 IMPLANT
GLOVE BIOGEL PI IND STRL 7.5 (GLOVE) ×1 IMPLANT
GLOVE BIOGEL PI INDICATOR 6.5 (GLOVE) ×2
GLOVE BIOGEL PI INDICATOR 7.0 (GLOVE) ×1
GLOVE BIOGEL PI INDICATOR 7.5 (GLOVE) ×1
GLOVE ECLIPSE 6.5 STRL STRAW (GLOVE) ×2 IMPLANT
GLOVE ECLIPSE 7.0 STRL STRAW (GLOVE) ×2 IMPLANT
GOWN STRL REUS W/ TWL LRG LVL3 (GOWN DISPOSABLE) ×3 IMPLANT
GOWN STRL REUS W/TWL LRG LVL3 (GOWN DISPOSABLE) ×3
KIT BASIN OR (CUSTOM PROCEDURE TRAY) ×2 IMPLANT
KIT ROOM TURNOVER OR (KITS) ×2 IMPLANT
LIQUID BAND (GAUZE/BANDAGES/DRESSINGS) ×2 IMPLANT
LOOP VESSEL MINI RED (MISCELLANEOUS) IMPLANT
NS IRRIG 1000ML POUR BTL (IV SOLUTION) ×2 IMPLANT
PACK CV ACCESS (CUSTOM PROCEDURE TRAY) ×2 IMPLANT
PAD ARMBOARD 7.5X6 YLW CONV (MISCELLANEOUS) ×4 IMPLANT
SPONGE SURGIFOAM ABS GEL 100 (HEMOSTASIS) IMPLANT
SUT PROLENE 7 0 BV 1 (SUTURE) ×2 IMPLANT
SUT VIC AB 3-0 SH 27 (SUTURE) ×1
SUT VIC AB 3-0 SH 27X BRD (SUTURE) ×1 IMPLANT
SUT VICRYL 4-0 PS2 18IN ABS (SUTURE) ×2 IMPLANT
UNDERPAD 30X30 INCONTINENT (UNDERPADS AND DIAPERS) ×2 IMPLANT
WATER STERILE IRR 1000ML POUR (IV SOLUTION) ×2 IMPLANT

## 2016-06-07 NOTE — Anesthesia Preprocedure Evaluation (Addendum)
Anesthesia Evaluation  Patient identified by MRN, date of birth, ID band Patient awake    Reviewed: Allergy & Precautions, NPO status , Patient's Chart, lab work & pertinent test results  History of Anesthesia Complications Negative for: history of anesthetic complications  Airway Mallampati: II       Dental  (+) Edentulous Upper, Edentulous Lower, Dental Advidsory Given   Pulmonary asthma , former smoker,    Pulmonary exam normal breath sounds clear to auscultation       Cardiovascular hypertension, Pt. on medications +CHF   Rhythm:Regular     Neuro/Psych negative neurological ROS  negative psych ROS   GI/Hepatic   Endo/Other  diabetes, Type 2  Renal/GU ESRF and DialysisRenal disease     Musculoskeletal   Abdominal (+) + obese,  Abdomen: soft.    Peds  Hematology   Anesthesia Other Findings   Reproductive/Obstetrics                            Anesthesia Physical  Anesthesia Plan  ASA: IV  Anesthesia Plan: MAC   Post-op Pain Management:    Induction: Intravenous  Airway Management Planned: Simple Face Mask  Additional Equipment:   Intra-op Plan:   Post-operative Plan:   Informed Consent: I have reviewed the patients History and Physical, chart, labs and discussed the procedure including the risks, benefits and alternatives for the proposed anesthesia with the patient or authorized representative who has indicated his/her understanding and acceptance.   Dental advisory given and Dental Advisory Given  Plan Discussed with: CRNA, Anesthesiologist and Surgeon  Anesthesia Plan Comments: (K=2.8, local w/o sedation)       Anesthesia Quick Evaluation

## 2016-06-07 NOTE — Anesthesia Postprocedure Evaluation (Signed)
Anesthesia Post Note  Patient: Elikai Astbury  Procedure(s) Performed: Procedure(s) (LRB): LEFT BRACHIOCEPHALIC ARTERIOVENOUS (AV) FISTULA CREATION (Left)  Patient location during evaluation: PACU Anesthesia Type: MAC Level of consciousness: awake and alert Pain management: pain level controlled Vital Signs Assessment: post-procedure vital signs reviewed and stable Respiratory status: spontaneous breathing, nonlabored ventilation, respiratory function stable and patient connected to nasal cannula oxygen Cardiovascular status: stable and blood pressure returned to baseline Anesthetic complications: no    Last Vitals:  Filed Vitals:   06/07/16 0908 06/07/16 0913  BP: 125/73   Pulse:  74  Temp:  36.2 C  Resp:  17    Last Pain:  Filed Vitals:   06/07/16 0916  PainSc: 0-No pain                 Effie Berkshire

## 2016-06-07 NOTE — Transfer of Care (Signed)
Immediate Anesthesia Transfer of Care Note  Patient: James [name]  Procedure(s) Performed: Procedure(s): LEFT BRACHIOCEPHALIC ARTERIOVENOUS (AV) FISTULA CREATION (Left)  Patient Location: PACU  Anesthesia Type:MAC  Level of Consciousness: awake, alert  and oriented  Airway & Oxygen Therapy: Patient Spontanous Breathing  Post-op Assessment: Report given to RN, Post -op Vital signs reviewed and stable and Patient moving all extremities X 4  Post vital signs: Reviewed and stable  Last Vitals:  Filed Vitals:   06/07/16 0603 06/07/16 0853  BP: 150/88 118/69  Pulse: 84 73  Temp: 37 C 36.5 C  Resp: 20 14    Last Pain: There were no vitals filed for this visit.    Patients Stated Pain Goal: 3 ([id number])  Complications: No apparent anesthesia complications

## 2016-06-07 NOTE — Interval H&P Note (Signed)
History and Physical Interval Note:  06/07/2016 7:18 AM  James [name]  has presented today for surgery, with the diagnosis of End Stage Renal Disease N18.6  The various methods of treatment have been discussed with the patient and family. After consideration of risks, benefits and other options for treatment, the patient has consented to  Procedure(s): BRACHIOCEPHALIC ARTERIOVENOUS (AV) FISTULA CREATION (Left) as a surgical intervention .  The patient's history has been reviewed, patient examined, no change in status, stable for surgery.  I have reviewed the patient's chart and labs.  Questions were answered to the patient's satisfaction.     Ruta Hinds

## 2016-06-07 NOTE — H&P (View-Only) (Signed)
Patient is a 60 year old male who returns for postoperative follow-up today. He recently underwent ligation of a right brachiocephalic AV fistula for venous hypertension. He reports that the swelling in his right upper extremity has nearly returned to baseline. He has about 5% residual swelling. He currently is dialyzing via a right-sided catheter. He has had a previous left radiocephalic AV fistula. He underwent vein mapping of his left upper extremity recently which shows a good quality left upper arm cephalic vein. He has previously had a central venogram which showed his left central veins were patent. This was October 2016.  Past Surgical History  Procedure Laterality Date  . Colonoscopy    . Av fistula placement Left 04/08/2015    Procedure: LEFT RADIOCEPHALIC ARTERIOVENOUS (AV) FISTULA CREATION;  Surgeon: Elam Dutch, MD;  Location: [facility];  Service: Vascular;  Laterality: Left;  . Peripheral vascular catheterization N/A 07/04/2015    Procedure: Fistulagram;  Surgeon: Elam Dutch, MD;  Location: [facility];  Service: Cardiovascular;  Laterality: N/A;  . Ligation of competing branches of arteriovenous fistula Left 07/16/2015    Procedure: LIGATION OF SIDE BRANCHES OF ARTERIOVENOUS FISTULA LEFT ARM;  Surgeon: Rosetta Posner, MD;  Location: [facility];  Service: Vascular;  Laterality: Left;  . Peripheral vascular catheterization N/A 09/08/2015    Procedure: Fistulagram;  Surgeon: Conrad Valmy, MD;  Location: [facility];  Service: Cardiovascular;  Laterality: N/A;  . Ligation of arteriovenous  fistula Left 09/15/2015    Procedure: LIGATION OF RADIOCEPHALIC ARTERIOVENOUS FISTULA - LEFT ARM;  Surgeon: Conrad Bronaugh, MD;  Location: [facility];  Service: Vascular;  Laterality: Left;  . Av fistula placement Right 09/15/2015    Procedure: RADIOCEPHALIC VERSUS BRACHIOCEPHALIC ARTERIOVENOUS (AV) FISTULA CREATION - RIGHT ARM;  Surgeon: Conrad Farmington, MD;  Location: [facility];  Service: Vascular;   Laterality: Right;  . Peripheral vascular catheterization Right 02/23/2016    Procedure: Fistulagram;  Surgeon: Conrad Glouster, MD;  Location: [facility];  Service: Cardiovascular;  Laterality: Right;  . Revison of arteriovenous fistula Right 03/10/2016    Procedure: RIGHT CEPHALIC VEIN TURNDOWN;  Surgeon: Conrad Florence, MD;  Location: [facility];  Service: Vascular;  Laterality: Right;  . Peripheral vascular catheterization N/A 04/12/2016    Procedure: Fistulagram;  Surgeon: Conrad , MD;  Location: [facility];  Service: Cardiovascular;  Laterality: N/A;  . Ligation of arteriovenous  fistula Right 04/16/2016    Procedure: LIGATION OF ARTERIOVENOUS  FISTULA;  Surgeon: Elam Dutch, MD;  Location: [facility];  Service: Vascular;  Laterality: Right;     Physical exam:  Filed Vitals:   06/03/16 0830 06/03/16 0831  BP: 178/103 173/100  Pulse: 82   Height: 5\' 5"  (1.651 m)   Weight: 212 lb 12.8 oz (96.525 kg)   SpO2: 97%     Right upper extremity: Well-healed upper arm incision pulsatile proximal 5 cm of fistula no audible bruit on the right side catheter  Left upper extremity: 2+ brachial radial pulse  Assessment: Patient needs long-term hemodialysis access. Recent vein mapping ultrasound shows a left upper arm cephalic vein is of good quality.  Plan: Left brachiocephalic AV fistula [id number]. Risks benefits possible claudications and procedure details were discussed with the patient today including not limited to bleeding infection ischemic steal need for further procedures. He understands and agrees to proceed.  Ruta Hinds, MD [facility] Office: [phone] Pager: [phone]

## 2016-06-07 NOTE — Anesthesia Procedure Notes (Signed)
Procedure Name: MAC Date/Time: 06/07/2016 7:30 AM Performed by: Neldon Newport Pre-anesthesia Checklist: Timeout performed, Suction available, Emergency Drugs available, Patient being monitored and Patient identified Oxygen Delivery Method: Simple face mask Placement Confirmation: positive ETCO2 Dental Injury: Teeth and Oropharynx as per pre-operative assessment

## 2016-06-07 NOTE — Op Note (Signed)
Procedure: Left Brachial Cephalic AV fistula  Preop: ESRD  Postop: ESRD  Anesthesia: Local with IV sedation  Assistant:  Gerri Lins PA-C  Findings: 3.5 mm cephalic vein  Procedure: After obtaining informed consent, the patient was taken to the operating room.  After induction of general anesthesia, the left upper extremity was prepped and draped in usual sterile fashion.  A transverse incision was then made near the antecubital crease the left arm. The incision was carried into the subcutaneous tissues down to level of the cephalic vein. The cephalic vein was approximately 3.5 mm in diameter. It was of good quality. This was dissected free circumferentially and small side branches ligated and divided between silk ties or clips. Next the brachial artery was dissected free in the medial portion of the incision. The artery was  3-4 mm in diameter. The vessel loops were placed proximal and distal to the planned site of arteriotomy. The patient was given 3000 units of intravenous heparin. After appropriate circulation time, the vessel loops were used to control the artery. A longitudinal opening was made in the brachial artery.  The vein was ligated distally with a 2-0 silk tie. The vein was controlled proximally with a fine bulldog clamp. The vein was then swung over to the artery and sewn end of vein to side of artery using a running 7-0 Prolene suture. Just prior to completion of the anastomosis, everything was fore bled back bled and thoroughly flushed. The anastomosis was secured, vessel loops released, and there was a palpable thrill in the fistula immediately. After hemostasis was obtained, the subcutaneous tissues were reapproximated using a running 3-0 Vicryl suture. The skin was then closed with a 4 Vicryl subcuticular stitch. Dermabond was applied to the skin incision.    Ruta Hinds, MD [facility] [place] Office: [phone] Pager: [phone]

## 2016-06-07 NOTE — Progress Notes (Signed)
Pt has pre existing right upper chest Diatek. Dsg CDI, both ports are capped and clamped.

## 2016-06-08 DIAGNOSIS — N2581 Secondary hyperparathyroidism of renal origin: Secondary | ICD-10-CM | POA: Diagnosis not present

## 2016-06-08 DIAGNOSIS — N186 End stage renal disease: Secondary | ICD-10-CM | POA: Diagnosis not present

## 2016-06-08 DIAGNOSIS — E118 Type 2 diabetes mellitus with unspecified complications: Secondary | ICD-10-CM | POA: Diagnosis not present

## 2016-06-08 DIAGNOSIS — D631 Anemia in chronic kidney disease: Secondary | ICD-10-CM | POA: Diagnosis not present

## 2016-06-09 ENCOUNTER — Encounter (HOSPITAL_COMMUNITY): Payer: Self-pay | Admitting: Vascular Surgery

## 2016-06-10 ENCOUNTER — Telehealth: Payer: Self-pay | Admitting: Vascular Surgery

## 2016-06-10 DIAGNOSIS — N186 End stage renal disease: Secondary | ICD-10-CM | POA: Diagnosis not present

## 2016-06-10 DIAGNOSIS — N2581 Secondary hyperparathyroidism of renal origin: Secondary | ICD-10-CM | POA: Diagnosis not present

## 2016-06-10 DIAGNOSIS — E118 Type 2 diabetes mellitus with unspecified complications: Secondary | ICD-10-CM | POA: Diagnosis not present

## 2016-06-10 DIAGNOSIS — D631 Anemia in chronic kidney disease: Secondary | ICD-10-CM | POA: Diagnosis not present

## 2016-06-10 NOTE — Telephone Encounter (Signed)
sched appt 8/3 at 10:15. Lm on hm# to inform pt.

## 2016-06-10 NOTE — Telephone Encounter (Signed)
-----   Message from Denman George, RN sent at 06/07/2016  9:03 AM EDT ----- Regarding: needs 4 wk. f/u with [facility], and access duplex with appt   ----- Message -----    From: Ulyses Amor, PA-C    Sent: 06/07/2016   8:53 AM      To: [facility] Charge Pool  F/U in 4 weeks with Dr. Valinda Hoar

## 2016-06-12 DIAGNOSIS — N2581 Secondary hyperparathyroidism of renal origin: Secondary | ICD-10-CM | POA: Diagnosis not present

## 2016-06-12 DIAGNOSIS — D631 Anemia in chronic kidney disease: Secondary | ICD-10-CM | POA: Diagnosis not present

## 2016-06-12 DIAGNOSIS — N186 End stage renal disease: Secondary | ICD-10-CM | POA: Diagnosis not present

## 2016-06-12 DIAGNOSIS — E118 Type 2 diabetes mellitus with unspecified complications: Secondary | ICD-10-CM | POA: Diagnosis not present

## 2016-06-15 DIAGNOSIS — N2581 Secondary hyperparathyroidism of renal origin: Secondary | ICD-10-CM | POA: Diagnosis not present

## 2016-06-15 DIAGNOSIS — D631 Anemia in chronic kidney disease: Secondary | ICD-10-CM | POA: Diagnosis not present

## 2016-06-15 DIAGNOSIS — E118 Type 2 diabetes mellitus with unspecified complications: Secondary | ICD-10-CM | POA: Diagnosis not present

## 2016-06-15 DIAGNOSIS — N186 End stage renal disease: Secondary | ICD-10-CM | POA: Diagnosis not present

## 2016-06-17 DIAGNOSIS — D631 Anemia in chronic kidney disease: Secondary | ICD-10-CM | POA: Diagnosis not present

## 2016-06-17 DIAGNOSIS — N186 End stage renal disease: Secondary | ICD-10-CM | POA: Diagnosis not present

## 2016-06-17 DIAGNOSIS — N2581 Secondary hyperparathyroidism of renal origin: Secondary | ICD-10-CM | POA: Diagnosis not present

## 2016-06-17 DIAGNOSIS — E118 Type 2 diabetes mellitus with unspecified complications: Secondary | ICD-10-CM | POA: Diagnosis not present

## 2016-06-19 DIAGNOSIS — D631 Anemia in chronic kidney disease: Secondary | ICD-10-CM | POA: Diagnosis not present

## 2016-06-19 DIAGNOSIS — E118 Type 2 diabetes mellitus with unspecified complications: Secondary | ICD-10-CM | POA: Diagnosis not present

## 2016-06-19 DIAGNOSIS — N2581 Secondary hyperparathyroidism of renal origin: Secondary | ICD-10-CM | POA: Diagnosis not present

## 2016-06-19 DIAGNOSIS — N186 End stage renal disease: Secondary | ICD-10-CM | POA: Diagnosis not present

## 2016-06-22 DIAGNOSIS — D631 Anemia in chronic kidney disease: Secondary | ICD-10-CM | POA: Diagnosis not present

## 2016-06-22 DIAGNOSIS — N2581 Secondary hyperparathyroidism of renal origin: Secondary | ICD-10-CM | POA: Diagnosis not present

## 2016-06-22 DIAGNOSIS — N186 End stage renal disease: Secondary | ICD-10-CM | POA: Diagnosis not present

## 2016-06-22 DIAGNOSIS — E118 Type 2 diabetes mellitus with unspecified complications: Secondary | ICD-10-CM | POA: Diagnosis not present

## 2016-06-24 DIAGNOSIS — E118 Type 2 diabetes mellitus with unspecified complications: Secondary | ICD-10-CM | POA: Diagnosis not present

## 2016-06-24 DIAGNOSIS — N186 End stage renal disease: Secondary | ICD-10-CM | POA: Diagnosis not present

## 2016-06-24 DIAGNOSIS — D631 Anemia in chronic kidney disease: Secondary | ICD-10-CM | POA: Diagnosis not present

## 2016-06-24 DIAGNOSIS — N2581 Secondary hyperparathyroidism of renal origin: Secondary | ICD-10-CM | POA: Diagnosis not present

## 2016-06-26 DIAGNOSIS — D631 Anemia in chronic kidney disease: Secondary | ICD-10-CM | POA: Diagnosis not present

## 2016-06-26 DIAGNOSIS — N186 End stage renal disease: Secondary | ICD-10-CM | POA: Diagnosis not present

## 2016-06-26 DIAGNOSIS — N2581 Secondary hyperparathyroidism of renal origin: Secondary | ICD-10-CM | POA: Diagnosis not present

## 2016-06-26 DIAGNOSIS — E118 Type 2 diabetes mellitus with unspecified complications: Secondary | ICD-10-CM | POA: Diagnosis not present

## 2016-06-29 DIAGNOSIS — E118 Type 2 diabetes mellitus with unspecified complications: Secondary | ICD-10-CM | POA: Diagnosis not present

## 2016-06-29 DIAGNOSIS — D631 Anemia in chronic kidney disease: Secondary | ICD-10-CM | POA: Diagnosis not present

## 2016-06-29 DIAGNOSIS — N2581 Secondary hyperparathyroidism of renal origin: Secondary | ICD-10-CM | POA: Diagnosis not present

## 2016-06-29 DIAGNOSIS — N186 End stage renal disease: Secondary | ICD-10-CM | POA: Diagnosis not present

## 2016-06-30 ENCOUNTER — Encounter: Payer: Self-pay | Admitting: Family Medicine

## 2016-06-30 ENCOUNTER — Ambulatory Visit (INDEPENDENT_AMBULATORY_CARE_PROVIDER_SITE_OTHER): Payer: Medicare Other | Admitting: Family Medicine

## 2016-06-30 VITALS — BP 115/68 | HR 84 | Temp 98.5°F | Ht 65.0 in | Wt 207.2 lb

## 2016-06-30 DIAGNOSIS — E118 Type 2 diabetes mellitus with unspecified complications: Secondary | ICD-10-CM

## 2016-06-30 DIAGNOSIS — R0902 Hypoxemia: Secondary | ICD-10-CM | POA: Diagnosis not present

## 2016-06-30 DIAGNOSIS — I10 Essential (primary) hypertension: Secondary | ICD-10-CM | POA: Diagnosis not present

## 2016-06-30 DIAGNOSIS — Z794 Long term (current) use of insulin: Secondary | ICD-10-CM | POA: Diagnosis not present

## 2016-06-30 LAB — LIPID PANEL
CHOL/HDL RATIO: 3.9 ratio (ref 0.0–5.0)
Cholesterol, Total: 163 mg/dL (ref 100–199)
HDL: 42 mg/dL (ref >39)
LDL CALC: 97 mg/dL (ref 0–99)
Triglycerides: 121 mg/dL (ref 0–149)
VLDL Cholesterol Cal: 24 mg/dL (ref 5–40)

## 2016-06-30 LAB — BAYER DCA HB A1C WAIVED: HB A1C (BAYER DCA - WAIVED): 12.2 % — ABNORMAL HIGH (ref <7.0)

## 2016-06-30 MED ORDER — GLUCOSE BLOOD VI STRP
ORAL_STRIP | 12 refills | Status: DC
Start: 2016-06-30 — End: 2016-07-14

## 2016-06-30 NOTE — Progress Notes (Signed)
Subjective:    Patient ID: James [name], male    DOB: Apr 13, 1956, 60 y.o.   MRN: [id number]  HPI 60 year old gentleman with end-stage renal disease on dialysis. Historically renal failure is related to diabetes which has not really received much attention of late. Current treatment for diabetes includes Levemir 20 units twice a day. There is also a history of congestive heart failure and multiple myeloma.  Patient seems to have some insight and understanding his far as his kidneys and diabetes. He does have some diabetic neuropathy and takes gabapentin 600 mg at night. There are no recent labs concerning his diabetes such as A1c or blood sugar checks.  Patient Active Problem List   Diagnosis Date Noted  . 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 Multiple myeloma (HCC)   . Acute on chronic diastolic CHF (congestive heart failure), NYHA class 4 (HCC)   . Hypoxia   . Congestive heart disease (HCC)   . Hypokalemia 03/23/2015  . Peripheral edema 03/22/2015  . CHF (congestive heart failure) (HCC) 03/22/2015  . HTN (hypertension) 03/22/2015  . DM (diabetes mellitus) (HCC) 03/22/2015  . ESRD on dialysis (HCC) 03/22/2015  . Anemia 03/22/2015   Outpatient Encounter Prescriptions as of 06/30/2016  Medication Sig  . calcium acetate (PHOSLO) 667 MG capsule Take 2 capsules (1,334 mg total) by mouth 3 (three) times daily with meals.  . Darbepoetin Alfa (ARANESP) 100 MCG/0.5ML SOSY injection Inject 0.5 mLs (100 mcg total) into the vein every Thursday with hemodialysis. Will be given at dialysis.  Marland Kitchen gabapentin (NEURONTIN) 300 MG capsule Take 1 capsule (300 mg total) by mouth at bedtime.  . insulin detemir (LEVEMIR) 100 UNIT/ML injection Inject 20 Units into the skin 2 (two) times daily.  . isosorbide mononitrate (IMDUR) 60 MG 24 hr tablet Take 1 tablet (60 mg total) by mouth daily.  . Nutritional Supplements (FEEDING SUPPLEMENT, NEPRO CARB STEADY,) LIQD Take 237 mLs by mouth 3 (three) times a week.  Reported on 04/09/2016  . Vitamin D, Ergocalciferol, (DRISDOL) 50000 units CAPS capsule Take 50,000 Units by mouth every 7 (seven) days.  . [DISCONTINUED] acetaminophen (TYLENOL) 325 MG tablet Take 1 tablet (325 mg total) by mouth every 6 (six) hours as needed. (Patient not taking: Reported on 06/03/2016)  . [DISCONTINUED] oxyCODONE-acetaminophen (PERCOCET/ROXICET) 5-325 MG tablet Take 1 tablet by mouth every 6 (six) hours as needed.   No facility-administered encounter medications on file as of 06/30/2016.       Review of Systems  Constitutional: Negative.   HENT: Negative.   Respiratory: Negative.   Cardiovascular: Negative.   Neurological: Positive for tremors (withwith higher doses of gabapentin).  Psychiatric/Behavioral: Negative.        Objective:   Physical Exam  Constitutional: He is oriented to person, place, and time. He appears well-developed and well-nourished.  Cardiovascular: Normal rate, regular rhythm and normal heart sounds.   There is scarring on both arms secondary to shunt placement. Apparently the initial shot in the right arm worked for a short time and now he is in process of letting the new shunt in the left arm mature and is getting dialysis through a central access  Pulmonary/Chest: Effort normal and breath sounds normal.  Neurological: He is alert and oriented to person, place, and time.  Psychiatric: He has a normal mood and affect. His behavior is normal.   BP 115/68   Pulse 84   Temp 98.5 F (36.9 C) (Oral)   Ht 5\' 5"  (1.651 m)   Wt  207 lb 3.2 oz (94 kg)   BMI 34.48 kg/m         Assessment & Plan:  1. Essential hypertension No assessment of his lipids are available - Lipid panel  2. Type 2 diabetes mellitus with complication, with long-term current use of insulin (HCC) Last A1c I have was greater than one year ago so we do not really know how his sugars are controlled on Levemir. I have given him a record to monitor his sugars on along with a  new glucometer. Will check him back in 2 weeks after we do A1c today and have 2 weeks worth of blood sugar readings - [facility] DCA Hb A1c Waived  Frederica Kuster MD

## 2016-07-01 ENCOUNTER — Telehealth: Payer: Self-pay | Admitting: Family Medicine

## 2016-07-01 DIAGNOSIS — E118 Type 2 diabetes mellitus with unspecified complications: Secondary | ICD-10-CM | POA: Diagnosis not present

## 2016-07-01 DIAGNOSIS — D631 Anemia in chronic kidney disease: Secondary | ICD-10-CM | POA: Diagnosis not present

## 2016-07-01 DIAGNOSIS — N186 End stage renal disease: Secondary | ICD-10-CM | POA: Diagnosis not present

## 2016-07-01 DIAGNOSIS — N2581 Secondary hyperparathyroidism of renal origin: Secondary | ICD-10-CM | POA: Diagnosis not present

## 2016-07-02 ENCOUNTER — Encounter: Payer: Self-pay | Admitting: Vascular Surgery

## 2016-07-03 DIAGNOSIS — N186 End stage renal disease: Secondary | ICD-10-CM | POA: Diagnosis not present

## 2016-07-03 DIAGNOSIS — D631 Anemia in chronic kidney disease: Secondary | ICD-10-CM | POA: Diagnosis not present

## 2016-07-03 DIAGNOSIS — N2581 Secondary hyperparathyroidism of renal origin: Secondary | ICD-10-CM | POA: Diagnosis not present

## 2016-07-03 DIAGNOSIS — E118 Type 2 diabetes mellitus with unspecified complications: Secondary | ICD-10-CM | POA: Diagnosis not present

## 2016-07-05 DIAGNOSIS — N186 End stage renal disease: Secondary | ICD-10-CM | POA: Diagnosis not present

## 2016-07-05 DIAGNOSIS — N041 Nephrotic syndrome with focal and segmental glomerular lesions: Secondary | ICD-10-CM | POA: Diagnosis not present

## 2016-07-05 DIAGNOSIS — Z992 Dependence on renal dialysis: Secondary | ICD-10-CM | POA: Diagnosis not present

## 2016-07-06 DIAGNOSIS — N2581 Secondary hyperparathyroidism of renal origin: Secondary | ICD-10-CM | POA: Diagnosis not present

## 2016-07-06 DIAGNOSIS — E118 Type 2 diabetes mellitus with unspecified complications: Secondary | ICD-10-CM | POA: Diagnosis not present

## 2016-07-06 DIAGNOSIS — D631 Anemia in chronic kidney disease: Secondary | ICD-10-CM | POA: Diagnosis not present

## 2016-07-06 DIAGNOSIS — N186 End stage renal disease: Secondary | ICD-10-CM | POA: Diagnosis not present

## 2016-07-08 ENCOUNTER — Encounter: Payer: Self-pay | Admitting: Vascular Surgery

## 2016-07-08 ENCOUNTER — Ambulatory Visit (INDEPENDENT_AMBULATORY_CARE_PROVIDER_SITE_OTHER): Payer: Medicare Other | Admitting: Vascular Surgery

## 2016-07-08 VITALS — BP 124/72 | HR 89 | Temp 98.8°F | Resp 16 | Ht 65.0 in | Wt 213.0 lb

## 2016-07-08 DIAGNOSIS — E118 Type 2 diabetes mellitus with unspecified complications: Secondary | ICD-10-CM | POA: Diagnosis not present

## 2016-07-08 DIAGNOSIS — N186 End stage renal disease: Secondary | ICD-10-CM | POA: Diagnosis not present

## 2016-07-08 DIAGNOSIS — N2581 Secondary hyperparathyroidism of renal origin: Secondary | ICD-10-CM | POA: Diagnosis not present

## 2016-07-08 DIAGNOSIS — D631 Anemia in chronic kidney disease: Secondary | ICD-10-CM | POA: Diagnosis not present

## 2016-07-08 DIAGNOSIS — Z992 Dependence on renal dialysis: Secondary | ICD-10-CM

## 2016-07-08 NOTE — Progress Notes (Signed)
Patient is a 60 year old male who returns today for postoperative follow-up after placement of a left brachiocephalic AV fistula on 99991111. He denies any numbness or tingling in his hand. Incision is well-healed. He previously had ligation of a right brachiocephalic AV fistula for venous hypertension. He still has some residual swelling in the right arm but this is essentially back to baseline.  Physical exam:  Vitals:   07/08/16 1021  BP: 124/72  Pulse: 89  Resp: 16  Temp: 98.8 F (37.1 C)  TempSrc: Oral  SpO2: 96%  Weight: 213 lb (96.6 kg)  Height: 5\' 5"  (1.651 m)  Neck: Right-sided dialysis catheter  Left upper extremity: Palpable thrill fistula is palpable probably 4-5 mm diameter felt in the upper arm. Incision is well-healed.  Right upper extremity trace edema approximate 5% larger than left upper extremity  Assessment: Resolving venous hypertension symptoms right arm, maturing fistula left arm  Plan: Follow-up 6 weeks with duplex ultrasound of his left upper arm AV fistula. If fistula is of reasonable diameter and depth at that point we'll consider cannulation.  Ruta Hinds, MD [facility] [place] Office: [phone] Pager: [phone]

## 2016-07-10 DIAGNOSIS — E118 Type 2 diabetes mellitus with unspecified complications: Secondary | ICD-10-CM | POA: Diagnosis not present

## 2016-07-10 DIAGNOSIS — D631 Anemia in chronic kidney disease: Secondary | ICD-10-CM | POA: Diagnosis not present

## 2016-07-10 DIAGNOSIS — N2581 Secondary hyperparathyroidism of renal origin: Secondary | ICD-10-CM | POA: Diagnosis not present

## 2016-07-10 DIAGNOSIS — N186 End stage renal disease: Secondary | ICD-10-CM | POA: Diagnosis not present

## 2016-07-13 DIAGNOSIS — D631 Anemia in chronic kidney disease: Secondary | ICD-10-CM | POA: Diagnosis not present

## 2016-07-13 DIAGNOSIS — E118 Type 2 diabetes mellitus with unspecified complications: Secondary | ICD-10-CM | POA: Diagnosis not present

## 2016-07-13 DIAGNOSIS — N2581 Secondary hyperparathyroidism of renal origin: Secondary | ICD-10-CM | POA: Diagnosis not present

## 2016-07-13 DIAGNOSIS — N186 End stage renal disease: Secondary | ICD-10-CM | POA: Diagnosis not present

## 2016-07-14 ENCOUNTER — Ambulatory Visit (INDEPENDENT_AMBULATORY_CARE_PROVIDER_SITE_OTHER): Payer: Medicare Other | Admitting: Family Medicine

## 2016-07-14 ENCOUNTER — Encounter: Payer: Self-pay | Admitting: Family Medicine

## 2016-07-14 VITALS — BP 139/78 | HR 81 | Temp 98.1°F | Ht 65.0 in | Wt 209.0 lb

## 2016-07-14 DIAGNOSIS — Z794 Long term (current) use of insulin: Secondary | ICD-10-CM

## 2016-07-14 DIAGNOSIS — E118 Type 2 diabetes mellitus with unspecified complications: Secondary | ICD-10-CM

## 2016-07-14 MED ORDER — GLUCOSE BLOOD VI STRP: ORAL_STRIP | 12 refills | Status: DC

## 2016-07-14 MED ORDER — INSULIN ASPART 100 UNIT/ML ~~LOC~~ SOLN: 60.0000 [IU] | Freq: Three times a day (TID) | SUBCUTANEOUS | 99 refills | Status: DC

## 2016-07-14 NOTE — Progress Notes (Signed)
Subjective:    Patient ID: James [name], male    DOB: [birth year], 60 y.o.   MRN: [id number]  HPI 60 year old gentleman who we saw for the first time 2 weeks ago who is on dialysis but has diabetes that has been mostly neglected recently. He takes Levemir 20 units twice a day. I had asked him to bring back a record of his sugars 4 times a day but he ran out of test strips or at least that is excuse. We have really scanty records of his sugars but there are many in the 3 and 400s. We just spent some time today talking about diabetes how important diet is and how we will attempt to get his sugars down using the basal insulin and short acting insulin the plan is as follows combined the 20 units into one dose at nighttime or 40 units of Levemir at night then began with a fixed dose of short acting insulin, NovoLog, 6 units before each meal he has been instructed to not take the NovoLog if he does not eat a meal. He will continue to try to monitor his sugars and come back in 2 weeks to see either Tammy or me and we will proceed from there.  Patient Active Problem List   Diagnosis Date Noted  . 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 Multiple myeloma (HCC)   . Acute on chronic diastolic CHF (congestive heart failure), NYHA class 4 (HCC)   . Hypoxia   . Congestive heart disease (HCC)   . Hypokalemia 03/23/2015  . Peripheral edema 03/22/2015  . CHF (congestive heart failure) (HCC) 03/22/2015  . HTN (hypertension) 03/22/2015  . DM (diabetes mellitus) (HCC) 03/22/2015  . ESRD on dialysis (HCC) 03/22/2015  . Anemia 03/22/2015   Outpatient Encounter Prescriptions as of 07/14/2016  Medication Sig  . calcium acetate (PHOSLO) 667 MG capsule Take 2 capsules (1,334 mg total) by mouth 3 (three) times daily with meals.  . Darbepoetin Alfa (ARANESP) 100 MCG/0.5ML SOSY injection Inject 0.5 mLs (100 mcg total) into the vein every Thursday with hemodialysis. Will be given at dialysis.  Marland Kitchen gabapentin (NEURONTIN) 300  MG capsule Take 1 capsule (300 mg total) by mouth at bedtime.  Marland Kitchen glucose blood test strip Use as instructed  . insulin detemir (LEVEMIR) 100 UNIT/ML injection Inject 20 Units into the skin 2 (two) times daily.  . Nutritional Supplements (FEEDING SUPPLEMENT, NEPRO CARB STEADY,) LIQD Take 237 mLs by mouth 3 (three) times a week. Reported on 04/09/2016  . Vitamin D, Ergocalciferol, (DRISDOL) 50000 units CAPS capsule Take 50,000 Units by mouth every 7 (seven) days.  . isosorbide mononitrate (IMDUR) 60 MG 24 hr tablet Take 1 tablet (60 mg total) by mouth daily. (Patient not taking: Reported on 07/14/2016)   No facility-administered encounter medications on file as of 07/14/2016.       Review of Systems  Constitutional: Negative.   Respiratory: Negative.   Cardiovascular: Negative.        Objective:   Physical Exam  Constitutional: He is oriented to person, place, and time. He appears well-developed and well-nourished.  Cardiovascular: Normal rate.   Pulmonary/Chest: Effort normal.  Neurological: He is alert and oriented to person, place, and time.  Psychiatric: He has a normal mood and affect.   BP 139/78 (BP Location: Right Arm, Patient Position: Sitting, Cuff Size: Normal)   Pulse 81   Temp 98.1 F (36.7 C) (Oral)   Ht 5\' 5"  (1.651 m)   Wt 209 lb (94.8  kg)   BMI 34.78 kg/m         Assessment & Plan:  1. Type 2 diabetes mellitus with complication, with long-term current use of insulin (HCC) Plan as outlined in history of present illness. Will go to short acting and basal insulin. And monitor closely. We'll involve our clinical pharmacologist. I see from his old record that he has been seen by an endocrinologist in the past but I think we should be able to control his sugars as much as he is willing to comply with treatment recommendations. We will recheck him in 2 weeks  Frederica Kuster MD

## 2016-07-15 ENCOUNTER — Telehealth: Payer: Self-pay | Admitting: Family Medicine

## 2016-07-15 DIAGNOSIS — N2581 Secondary hyperparathyroidism of renal origin: Secondary | ICD-10-CM | POA: Diagnosis not present

## 2016-07-15 DIAGNOSIS — E118 Type 2 diabetes mellitus with unspecified complications: Secondary | ICD-10-CM | POA: Diagnosis not present

## 2016-07-15 DIAGNOSIS — N186 End stage renal disease: Secondary | ICD-10-CM | POA: Diagnosis not present

## 2016-07-15 DIAGNOSIS — D631 Anemia in chronic kidney disease: Secondary | ICD-10-CM | POA: Diagnosis not present

## 2016-07-15 NOTE — Telephone Encounter (Signed)
Spoke with pt to confirm dosage of Novolog should be 6 units Pt verbalizes understanding

## 2016-07-16 ENCOUNTER — Telehealth: Payer: Self-pay | Admitting: Family Medicine

## 2016-07-16 MED ORDER — GLUCOSE BLOOD VI STRP: ORAL_STRIP | 12 refills | Status: DC

## 2016-07-16 NOTE — Telephone Encounter (Signed)
Patient aware that rx resent.

## 2016-07-17 DIAGNOSIS — N2581 Secondary hyperparathyroidism of renal origin: Secondary | ICD-10-CM | POA: Diagnosis not present

## 2016-07-17 DIAGNOSIS — E118 Type 2 diabetes mellitus with unspecified complications: Secondary | ICD-10-CM | POA: Diagnosis not present

## 2016-07-17 DIAGNOSIS — N186 End stage renal disease: Secondary | ICD-10-CM | POA: Diagnosis not present

## 2016-07-17 DIAGNOSIS — D631 Anemia in chronic kidney disease: Secondary | ICD-10-CM | POA: Diagnosis not present

## 2016-07-20 DIAGNOSIS — E118 Type 2 diabetes mellitus with unspecified complications: Secondary | ICD-10-CM | POA: Diagnosis not present

## 2016-07-20 DIAGNOSIS — N186 End stage renal disease: Secondary | ICD-10-CM | POA: Diagnosis not present

## 2016-07-20 DIAGNOSIS — N2581 Secondary hyperparathyroidism of renal origin: Secondary | ICD-10-CM | POA: Diagnosis not present

## 2016-07-20 DIAGNOSIS — D631 Anemia in chronic kidney disease: Secondary | ICD-10-CM | POA: Diagnosis not present

## 2016-07-22 DIAGNOSIS — D631 Anemia in chronic kidney disease: Secondary | ICD-10-CM | POA: Diagnosis not present

## 2016-07-22 DIAGNOSIS — E118 Type 2 diabetes mellitus with unspecified complications: Secondary | ICD-10-CM | POA: Diagnosis not present

## 2016-07-22 DIAGNOSIS — N2581 Secondary hyperparathyroidism of renal origin: Secondary | ICD-10-CM | POA: Diagnosis not present

## 2016-07-22 DIAGNOSIS — N186 End stage renal disease: Secondary | ICD-10-CM | POA: Diagnosis not present

## 2016-07-24 DIAGNOSIS — N2581 Secondary hyperparathyroidism of renal origin: Secondary | ICD-10-CM | POA: Diagnosis not present

## 2016-07-24 DIAGNOSIS — N186 End stage renal disease: Secondary | ICD-10-CM | POA: Diagnosis not present

## 2016-07-24 DIAGNOSIS — E118 Type 2 diabetes mellitus with unspecified complications: Secondary | ICD-10-CM | POA: Diagnosis not present

## 2016-07-24 DIAGNOSIS — D631 Anemia in chronic kidney disease: Secondary | ICD-10-CM | POA: Diagnosis not present

## 2016-07-27 DIAGNOSIS — E118 Type 2 diabetes mellitus with unspecified complications: Secondary | ICD-10-CM | POA: Diagnosis not present

## 2016-07-27 DIAGNOSIS — N2581 Secondary hyperparathyroidism of renal origin: Secondary | ICD-10-CM | POA: Diagnosis not present

## 2016-07-27 DIAGNOSIS — D631 Anemia in chronic kidney disease: Secondary | ICD-10-CM | POA: Diagnosis not present

## 2016-07-27 DIAGNOSIS — N186 End stage renal disease: Secondary | ICD-10-CM | POA: Diagnosis not present

## 2016-07-28 ENCOUNTER — Ambulatory Visit (INDEPENDENT_AMBULATORY_CARE_PROVIDER_SITE_OTHER): Payer: Medicare Other | Admitting: Family Medicine

## 2016-07-28 ENCOUNTER — Encounter: Payer: Self-pay | Admitting: Family Medicine

## 2016-07-28 VITALS — BP 111/63 | HR 86 | Temp 97.1°F | Ht 65.0 in | Wt 208.0 lb

## 2016-07-28 DIAGNOSIS — I10 Essential (primary) hypertension: Secondary | ICD-10-CM | POA: Diagnosis not present

## 2016-07-28 DIAGNOSIS — Z992 Dependence on renal dialysis: Secondary | ICD-10-CM

## 2016-07-28 DIAGNOSIS — N186 End stage renal disease: Secondary | ICD-10-CM | POA: Diagnosis not present

## 2016-07-28 DIAGNOSIS — E118 Type 2 diabetes mellitus with unspecified complications: Secondary | ICD-10-CM

## 2016-07-28 DIAGNOSIS — Z794 Long term (current) use of insulin: Secondary | ICD-10-CM | POA: Diagnosis not present

## 2016-07-28 MED ORDER — INSULIN ASPART 100 UNIT/ML ~~LOC~~ SOLN: 6.0000 [IU] | Freq: Three times a day (TID) | SUBCUTANEOUS | 99 refills | Status: DC

## 2016-07-28 NOTE — Progress Notes (Signed)
Subjective:    Patient ID: James [name], male    DOB: 09-Dec-1955, 61 y.o.   MRN: [id number]  HPI Patient here today for 2 week follow up on Blood sugar and insulin changes.  This is a follow-up visit. Patient had been on long-acting insulin only. He is on dialysis. At his last visit 2 weeks ago I added short acting insulin at 6 units with each meal. He brings in a record of his blood sugars and they have generally been between 150 and 350. I think this is progress. We talked about adjusting his short acting insulin today such that he will take 10 units before lunch and 8 units before supper but continuous 6 units before breakfast. His fasting blood sugars have averaged about 150 since he was here so I think that dosage is adequate. He has had some pruritus that I think is related to his chronic kidney disease.    Patient Active Problem List   Diagnosis Date Noted  . 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 Multiple myeloma (HCC)   . Acute on chronic diastolic CHF (congestive heart failure), NYHA class 4 (HCC)   . Hypoxia   . Congestive heart disease (HCC)   . Hypokalemia 03/23/2015  . Peripheral edema 03/22/2015  . CHF (congestive heart failure) (HCC) 03/22/2015  . HTN (hypertension) 03/22/2015  . DM (diabetes mellitus) (HCC) 03/22/2015  . ESRD on dialysis (HCC) 03/22/2015  . Anemia 03/22/2015   Outpatient Encounter Prescriptions as of 07/28/2016  Medication Sig  . calcium acetate (PHOSLO) 667 MG capsule Take 2 capsules (1,334 mg total) by mouth 3 (three) times daily with meals.  . Darbepoetin Alfa (ARANESP) 100 MCG/0.5ML SOSY injection Inject 0.5 mLs (100 mcg total) into the vein every Thursday with hemodialysis. Will be given at dialysis.  Marland Kitchen gabapentin (NEURONTIN) 300 MG capsule Take 1 capsule (300 mg total) by mouth at bedtime.  Marland Kitchen glucose blood (ACCU-CHEK AVIVA) test strip Test blood sugar TID and as needed  . insulin aspart (NOVOLOG) 100 UNIT/ML injection Inject 60 Units into  the skin 3 (three) times daily before meals.  . insulin detemir (LEVEMIR) 100 UNIT/ML injection Inject 20 Units into the skin 2 (two) times daily.  . isosorbide mononitrate (IMDUR) 60 MG 24 hr tablet Take 1 tablet (60 mg total) by mouth daily.  . Nutritional Supplements (FEEDING SUPPLEMENT, NEPRO CARB STEADY,) LIQD Take 237 mLs by mouth 3 (three) times a week. Reported on 04/09/2016  . Vitamin D, Ergocalciferol, (DRISDOL) 50000 units CAPS capsule Take 50,000 Units by mouth every 7 (seven) days.   No facility-administered encounter medications on file as of 07/28/2016.       Review of Systems  Constitutional: Negative.   HENT: Negative.   Eyes: Negative.   Respiratory: Negative.   Cardiovascular: Negative.   Gastrointestinal: Negative.   Endocrine: Negative.   Genitourinary: Negative.   Musculoskeletal: Negative.   Skin: Negative.        Itching after dialysis - took benadryl   Allergic/Immunologic: Negative.   Neurological: Negative.   Hematological: Negative.   Psychiatric/Behavioral: Negative.        Objective:   Physical Exam  Constitutional: He is oriented to person, place, and time. He appears well-developed and well-nourished.  Cardiovascular: Normal rate and regular rhythm.   Pulmonary/Chest: Effort normal and breath sounds normal.  Neurological: He is alert and oriented to person, place, and time.   BP 111/63 (BP Location: Right Arm)   Pulse 86   Temp 97.1 F (  36.2 C) (Oral)   Ht 5\' 5"  (1.651 m)   Wt 208 lb (94.3 kg)   BMI 34.61 kg/m         Assessment & Plan:  1. Essential hypertension Blood pressure is good today at 111/63  2. Type 2 diabetes mellitus with complication, with long-term current use of insulin (HCC) Noted in history of present illness sugars are coming down. My goal is to not try to achieve too tight control but if we can keep most sugars between 150 and 250 I think that would be good  3. ESRD on dialysis [facility]) Continues on dialysis as  managed by nephrology  Frederica Kuster MD

## 2016-07-29 DIAGNOSIS — D631 Anemia in chronic kidney disease: Secondary | ICD-10-CM | POA: Diagnosis not present

## 2016-07-29 DIAGNOSIS — N186 End stage renal disease: Secondary | ICD-10-CM | POA: Diagnosis not present

## 2016-07-29 DIAGNOSIS — N2581 Secondary hyperparathyroidism of renal origin: Secondary | ICD-10-CM | POA: Diagnosis not present

## 2016-07-29 DIAGNOSIS — E118 Type 2 diabetes mellitus with unspecified complications: Secondary | ICD-10-CM | POA: Diagnosis not present

## 2016-07-31 DIAGNOSIS — N186 End stage renal disease: Secondary | ICD-10-CM | POA: Diagnosis not present

## 2016-07-31 DIAGNOSIS — N2581 Secondary hyperparathyroidism of renal origin: Secondary | ICD-10-CM | POA: Diagnosis not present

## 2016-07-31 DIAGNOSIS — E118 Type 2 diabetes mellitus with unspecified complications: Secondary | ICD-10-CM | POA: Diagnosis not present

## 2016-07-31 DIAGNOSIS — D631 Anemia in chronic kidney disease: Secondary | ICD-10-CM | POA: Diagnosis not present

## 2016-08-03 ENCOUNTER — Ambulatory Visit: Payer: Medicare Other | Admitting: Pharmacist

## 2016-08-03 ENCOUNTER — Inpatient Hospital Stay (HOSPITAL_COMMUNITY)
Admission: EM | Admit: 2016-08-03 | Discharge: 2016-08-08 | DRG: 314 | Disposition: A | Discharge status: Home / Self Care | Payer: Medicare Other | Attending: Family Medicine | Admitting: Family Medicine

## 2016-08-03 ENCOUNTER — Encounter (HOSPITAL_COMMUNITY): Payer: Self-pay | Admitting: Family Medicine

## 2016-08-03 ENCOUNTER — Emergency Department (HOSPITAL_COMMUNITY): Payer: Medicare Other

## 2016-08-03 DIAGNOSIS — Y841 Kidney dialysis as the cause of abnormal reaction of the patient, or of later complication, without mention of misadventure at the time of the procedure: Secondary | ICD-10-CM | POA: Diagnosis not present

## 2016-08-03 DIAGNOSIS — R7881 Bacteremia: Secondary | ICD-10-CM | POA: Diagnosis present

## 2016-08-03 DIAGNOSIS — T80211A Bloodstream infection due to central venous catheter, initial encounter: Secondary | ICD-10-CM | POA: Diagnosis present

## 2016-08-03 DIAGNOSIS — R404 Transient alteration of awareness: Secondary | ICD-10-CM | POA: Diagnosis not present

## 2016-08-03 DIAGNOSIS — E1122 Type 2 diabetes mellitus with diabetic chronic kidney disease: Secondary | ICD-10-CM | POA: Diagnosis present

## 2016-08-03 DIAGNOSIS — R079 Chest pain, unspecified: Secondary | ICD-10-CM | POA: Diagnosis not present

## 2016-08-03 DIAGNOSIS — Z992 Dependence on renal dialysis: Secondary | ICD-10-CM | POA: Diagnosis not present

## 2016-08-03 DIAGNOSIS — E1142 Type 2 diabetes mellitus with diabetic polyneuropathy: Secondary | ICD-10-CM | POA: Diagnosis present

## 2016-08-03 DIAGNOSIS — I340 Nonrheumatic mitral (valve) insufficiency: Secondary | ICD-10-CM | POA: Diagnosis not present

## 2016-08-03 DIAGNOSIS — N189 Chronic kidney disease, unspecified: Secondary | ICD-10-CM

## 2016-08-03 DIAGNOSIS — Z794 Long term (current) use of insulin: Secondary | ICD-10-CM | POA: Diagnosis not present

## 2016-08-03 DIAGNOSIS — N2581 Secondary hyperparathyroidism of renal origin: Secondary | ICD-10-CM | POA: Diagnosis present

## 2016-08-03 DIAGNOSIS — N041 Nephrotic syndrome with focal and segmental glomerular lesions: Secondary | ICD-10-CM | POA: Diagnosis not present

## 2016-08-03 DIAGNOSIS — E8889 Other specified metabolic disorders: Secondary | ICD-10-CM | POA: Diagnosis present

## 2016-08-03 DIAGNOSIS — R7989 Other specified abnormal findings of blood chemistry: Secondary | ICD-10-CM | POA: Diagnosis not present

## 2016-08-03 DIAGNOSIS — I120 Hypertensive chronic kidney disease with stage 5 chronic kidney disease or end stage renal disease: Secondary | ICD-10-CM | POA: Diagnosis not present

## 2016-08-03 DIAGNOSIS — E871 Hypo-osmolality and hyponatremia: Secondary | ICD-10-CM | POA: Diagnosis present

## 2016-08-03 DIAGNOSIS — Y712 Prosthetic and other implants, materials and accessory cardiovascular devices associated with adverse incidents: Secondary | ICD-10-CM | POA: Diagnosis not present

## 2016-08-03 DIAGNOSIS — R531 Weakness: Secondary | ICD-10-CM | POA: Diagnosis not present

## 2016-08-03 DIAGNOSIS — T8241XA Breakdown (mechanical) of vascular dialysis catheter, initial encounter: Secondary | ICD-10-CM | POA: Diagnosis not present

## 2016-08-03 DIAGNOSIS — E1140 Type 2 diabetes mellitus with diabetic neuropathy, unspecified: Secondary | ICD-10-CM | POA: Diagnosis present

## 2016-08-03 DIAGNOSIS — I5042 Chronic combined systolic (congestive) and diastolic (congestive) heart failure: Secondary | ICD-10-CM | POA: Diagnosis present

## 2016-08-03 DIAGNOSIS — E119 Type 2 diabetes mellitus without complications: Secondary | ICD-10-CM

## 2016-08-03 DIAGNOSIS — R778 Other specified abnormalities of plasma proteins: Secondary | ICD-10-CM | POA: Diagnosis present

## 2016-08-03 DIAGNOSIS — T80219D Unspecified infection due to central venous catheter, subsequent encounter: Secondary | ICD-10-CM | POA: Diagnosis not present

## 2016-08-03 DIAGNOSIS — Z87891 Personal history of nicotine dependence: Secondary | ICD-10-CM

## 2016-08-03 DIAGNOSIS — D649 Anemia, unspecified: Secondary | ICD-10-CM | POA: Diagnosis present

## 2016-08-03 DIAGNOSIS — A4101 Sepsis due to Methicillin susceptible Staphylococcus aureus: Secondary | ICD-10-CM | POA: Diagnosis present

## 2016-08-03 DIAGNOSIS — N185 Chronic kidney disease, stage 5: Secondary | ICD-10-CM | POA: Diagnosis not present

## 2016-08-03 DIAGNOSIS — A021 Salmonella sepsis: Secondary | ICD-10-CM

## 2016-08-03 DIAGNOSIS — E1129 Type 2 diabetes mellitus with other diabetic kidney complication: Secondary | ICD-10-CM | POA: Diagnosis not present

## 2016-08-03 DIAGNOSIS — A419 Sepsis, unspecified organism: Secondary | ICD-10-CM | POA: Diagnosis not present

## 2016-08-03 DIAGNOSIS — C9000 Multiple myeloma not having achieved remission: Secondary | ICD-10-CM | POA: Diagnosis present

## 2016-08-03 DIAGNOSIS — N186 End stage renal disease: Secondary | ICD-10-CM | POA: Diagnosis present

## 2016-08-03 DIAGNOSIS — I132 Hypertensive heart and chronic kidney disease with heart failure and with stage 5 chronic kidney disease, or end stage renal disease: Secondary | ICD-10-CM | POA: Diagnosis present

## 2016-08-03 DIAGNOSIS — Z4901 Encounter for fitting and adjustment of extracorporeal dialysis catheter: Secondary | ICD-10-CM | POA: Diagnosis not present

## 2016-08-03 DIAGNOSIS — I361 Nonrheumatic tricuspid (valve) insufficiency: Secondary | ICD-10-CM | POA: Diagnosis not present

## 2016-08-03 DIAGNOSIS — I5022 Chronic systolic (congestive) heart failure: Secondary | ICD-10-CM

## 2016-08-03 DIAGNOSIS — D631 Anemia in chronic kidney disease: Secondary | ICD-10-CM | POA: Diagnosis present

## 2016-08-03 DIAGNOSIS — I509 Heart failure, unspecified: Secondary | ICD-10-CM | POA: Diagnosis not present

## 2016-08-03 DIAGNOSIS — Z79899 Other long term (current) drug therapy: Secondary | ICD-10-CM | POA: Diagnosis not present

## 2016-08-03 DIAGNOSIS — I10 Essential (primary) hypertension: Secondary | ICD-10-CM | POA: Diagnosis present

## 2016-08-03 DIAGNOSIS — B9561 Methicillin susceptible Staphylococcus aureus infection as the cause of diseases classified elsewhere: Secondary | ICD-10-CM | POA: Diagnosis not present

## 2016-08-03 DIAGNOSIS — R6520 Severe sepsis without septic shock: Secondary | ICD-10-CM | POA: Diagnosis present

## 2016-08-03 HISTORY — DX: End stage renal disease: N18.6

## 2016-08-03 LAB — CBC WITH DIFFERENTIAL/PLATELET
BASOS PCT: 0 %
Basophils Absolute: 0 10*3/uL (ref 0.0–0.1)
EOS ABS: 0 10*3/uL (ref 0.0–0.7)
EOS PCT: 0 %
HCT: 32.3 % — ABNORMAL LOW (ref 39.0–52.0)
Hemoglobin: 10.6 g/dL — ABNORMAL LOW (ref 13.0–17.0)
LYMPHS PCT: 5 %
Lymphs Abs: 0.8 10*3/uL (ref 0.7–4.0)
MCH: 28.9 pg (ref 26.0–34.0)
MCHC: 32.8 g/dL (ref 30.0–36.0)
MCV: 88 fL (ref 78.0–100.0)
Monocytes Absolute: 0.9 10*3/uL (ref 0.1–1.0)
Monocytes Relative: 6 %
NEUTROS PCT: 89 %
Neutro Abs: 13.6 10*3/uL — ABNORMAL HIGH (ref 1.7–7.7)
PLATELETS: 217 10*3/uL (ref 150–400)
RBC: 3.67 MIL/uL — ABNORMAL LOW (ref 4.22–5.81)
RDW: 15.2 % (ref 11.5–15.5)
WBC MORPHOLOGY: INCREASED
WBC: 15.3 10*3/uL — ABNORMAL HIGH (ref 4.0–10.5)

## 2016-08-03 LAB — BLOOD CULTURE ID PANEL (REFLEXED)
Acinetobacter baumannii: NOT DETECTED
CANDIDA GLABRATA: NOT DETECTED
CANDIDA KRUSEI: NOT DETECTED
CANDIDA PARAPSILOSIS: NOT DETECTED
CANDIDA TROPICALIS: NOT DETECTED
Candida albicans: NOT DETECTED
ENTEROBACTER CLOACAE COMPLEX: NOT DETECTED
ESCHERICHIA COLI: NOT DETECTED
Enterobacteriaceae species: NOT DETECTED
Enterococcus species: NOT DETECTED
Haemophilus influenzae: NOT DETECTED
KLEBSIELLA PNEUMONIAE: NOT DETECTED
Klebsiella oxytoca: NOT DETECTED
Listeria monocytogenes: NOT DETECTED
Methicillin resistance: NOT DETECTED
Neisseria meningitidis: NOT DETECTED
PROTEUS SPECIES: NOT DETECTED
Pseudomonas aeruginosa: NOT DETECTED
STAPHYLOCOCCUS SPECIES: DETECTED — AB
STREPTOCOCCUS AGALACTIAE: NOT DETECTED
Serratia marcescens: NOT DETECTED
Staphylococcus aureus (BCID): DETECTED — AB
Streptococcus pneumoniae: NOT DETECTED
Streptococcus pyogenes: NOT DETECTED
Streptococcus species: NOT DETECTED

## 2016-08-03 LAB — CBC
HCT: 34.6 % — ABNORMAL LOW (ref 39.0–52.0)
HEMOGLOBIN: 11.2 g/dL — AB (ref 13.0–17.0)
MCH: 28.9 pg (ref 26.0–34.0)
MCHC: 32.4 g/dL (ref 30.0–36.0)
MCV: 89.4 fL (ref 78.0–100.0)
Platelets: 189 10*3/uL (ref 150–400)
RBC: 3.87 MIL/uL — AB (ref 4.22–5.81)
RDW: 15.3 % (ref 11.5–15.5)
WBC: 15.4 10*3/uL — ABNORMAL HIGH (ref 4.0–10.5)

## 2016-08-03 LAB — BASIC METABOLIC PANEL
ANION GAP: 15 (ref 5–15)
BUN: 53 mg/dL — ABNORMAL HIGH (ref 6–20)
CO2: 17 mmol/L — ABNORMAL LOW (ref 22–32)
Calcium: 8.2 mg/dL — ABNORMAL LOW (ref 8.9–10.3)
Chloride: 92 mmol/L — ABNORMAL LOW (ref 101–111)
Creatinine, Ser: 11.12 mg/dL — ABNORMAL HIGH (ref 0.61–1.24)
GFR calc Af Amer: 5 mL/min — ABNORMAL LOW (ref >60)
GFR, EST NON AFRICAN AMERICAN: 4 mL/min — AB (ref >60)
GLUCOSE: 375 mg/dL — AB (ref 65–99)
POTASSIUM: 4.1 mmol/L (ref 3.5–5.1)
Sodium: 124 mmol/L — ABNORMAL LOW (ref 135–145)

## 2016-08-03 LAB — I-STAT CHEM 8, ED
BUN: 51 mg/dL — ABNORMAL HIGH (ref 6–20)
CHLORIDE: 93 mmol/L — AB (ref 101–111)
Calcium, Ion: 0.93 mmol/L — ABNORMAL LOW (ref 1.15–1.40)
Creatinine, Ser: 11.8 mg/dL — ABNORMAL HIGH (ref 0.61–1.24)
GLUCOSE: 378 mg/dL — AB (ref 65–99)
HEMATOCRIT: 35 % — AB (ref 39.0–52.0)
Hemoglobin: 11.9 g/dL — ABNORMAL LOW (ref 13.0–17.0)
POTASSIUM: 4.1 mmol/L (ref 3.5–5.1)
SODIUM: 128 mmol/L — AB (ref 135–145)
TCO2: 22 mmol/L (ref 0–100)

## 2016-08-03 LAB — I-STAT VENOUS BLOOD GAS, ED
Acid-base deficit: 3 mmol/L — ABNORMAL HIGH (ref 0.0–2.0)
Bicarbonate: 20.5 mmol/L (ref 20.0–28.0)
O2 Saturation: 64 %
PH VEN: 7.418 (ref 7.250–7.430)
TCO2: 21 mmol/L (ref 0–100)
pCO2, Ven: 31.8 mmHg — ABNORMAL LOW (ref 44.0–60.0)
pO2, Ven: 32 mmHg (ref 32.0–45.0)

## 2016-08-03 LAB — RENAL FUNCTION PANEL
ANION GAP: 12 (ref 5–15)
Albumin: 2.9 g/dL — ABNORMAL LOW (ref 3.5–5.0)
BUN: 19 mg/dL (ref 6–20)
CHLORIDE: 95 mmol/L — AB (ref 101–111)
CO2: 24 mmol/L (ref 22–32)
CREATININE: 5.39 mg/dL — AB (ref 0.61–1.24)
Calcium: 8 mg/dL — ABNORMAL LOW (ref 8.9–10.3)
GFR calc non Af Amer: 11 mL/min — ABNORMAL LOW (ref >60)
GFR, EST AFRICAN AMERICAN: 12 mL/min — AB (ref >60)
Glucose, Bld: 190 mg/dL — ABNORMAL HIGH (ref 65–99)
Phosphorus: 1.9 mg/dL — ABNORMAL LOW (ref 2.5–4.6)
Potassium: 3.3 mmol/L — ABNORMAL LOW (ref 3.5–5.1)
Sodium: 131 mmol/L — ABNORMAL LOW (ref 135–145)

## 2016-08-03 LAB — CBG MONITORING, ED: GLUCOSE-CAPILLARY: 369 mg/dL — AB (ref 65–99)

## 2016-08-03 LAB — GLUCOSE, CAPILLARY: GLUCOSE-CAPILLARY: 223 mg/dL — AB (ref 65–99)

## 2016-08-03 LAB — LACTIC ACID, PLASMA: Lactic Acid, Venous: 1.2 mmol/L (ref 0.5–1.9)

## 2016-08-03 LAB — PROCALCITONIN: PROCALCITONIN: 148.84 ng/mL

## 2016-08-03 LAB — PROTIME-INR
INR: 1.43
INR: 1.41
PROTHROMBIN TIME: 17.5 s — AB (ref 11.4–15.2)
Prothrombin Time: 17.4 seconds — ABNORMAL HIGH (ref 11.4–15.2)

## 2016-08-03 LAB — APTT: APTT: 29 s (ref 24–36)

## 2016-08-03 LAB — I-STAT CG4 LACTIC ACID, ED
LACTIC ACID, VENOUS: 1.65 mmol/L (ref 0.5–1.9)
Lactic Acid, Venous: 1.06 mmol/L (ref 0.5–1.9)

## 2016-08-03 LAB — TROPONIN I: TROPONIN I: 0.06 ng/mL — AB (ref <0.03)

## 2016-08-03 MED ORDER — VANCOMYCIN HCL IN DEXTROSE 1-5 GM/200ML-% IV SOLN: 1000.0000 mg | Freq: Once | INTRAVENOUS | Status: DC

## 2016-08-03 MED ORDER — ALTEPLASE 2 MG IJ SOLR: 2.0000 mg | Freq: Once | INTRAMUSCULAR | Status: DC | PRN

## 2016-08-03 MED ORDER — PIPERACILLIN-TAZOBACTAM 3.375 G IVPB: 3.3750 g | Freq: Two times a day (BID) | INTRAVENOUS | Status: DC

## 2016-08-03 MED ORDER — VANCOMYCIN HCL IN DEXTROSE 1-5 GM/200ML-% IV SOLN: 1000.0000 mg | INTRAVENOUS | Status: DC

## 2016-08-03 MED ORDER — GABAPENTIN 300 MG PO CAPS
300.0000 mg | ORAL_CAPSULE | Freq: Every day | ORAL | Status: DC
  Administered 2016-08-03 – 2016-08-07 (×5): 300 mg via ORAL
  Filled 2016-08-03 (×5): qty 1

## 2016-08-03 MED ORDER — VANCOMYCIN HCL 10 G IV SOLR
2000.0000 mg | Freq: Once | INTRAVENOUS | Status: AC
  Administered 2016-08-03: 2000 mg via INTRAVENOUS
  Filled 2016-08-03: qty 2000

## 2016-08-03 MED ORDER — ACETAMINOPHEN 500 MG PO TABS
1000.0000 mg | ORAL_TABLET | Freq: Three times a day (TID) | ORAL | Status: DC | PRN
  Administered 2016-08-03: 1000 mg via ORAL
  Filled 2016-08-03: qty 2

## 2016-08-03 MED ORDER — CEFAZOLIN IN D5W 1 GM/50ML IV SOLN
1.0000 g | INTRAVENOUS | Status: DC
  Administered 2016-08-03: 1 g via INTRAVENOUS
  Filled 2016-08-03: qty 50

## 2016-08-03 MED ORDER — DARBEPOETIN ALFA 100 MCG/0.5ML IJ SOSY
100.0000 ug | PREFILLED_SYRINGE | INTRAMUSCULAR | Status: DC
  Filled 2016-08-03: qty 0.5

## 2016-08-03 MED ORDER — PENTAFLUOROPROP-TETRAFLUOROETH EX AERO
1.0000 "application " | INHALATION_SPRAY | CUTANEOUS | Status: DC | PRN
  Filled 2016-08-03: qty 30

## 2016-08-03 MED ORDER — PIPERACILLIN-TAZOBACTAM 3.375 G IVPB
3.3750 g | Freq: Two times a day (BID) | INTRAVENOUS | Status: DC
  Administered 2016-08-03: 3.375 g via INTRAVENOUS
  Filled 2016-08-03 (×4): qty 50

## 2016-08-03 MED ORDER — INSULIN ASPART 100 UNIT/ML ~~LOC~~ SOLN
0.0000 [IU] | Freq: Three times a day (TID) | SUBCUTANEOUS | Status: DC
  Administered 2016-08-03: 9 [IU] via SUBCUTANEOUS
  Administered 2016-08-04 – 2016-08-05 (×4): 2 [IU] via SUBCUTANEOUS
  Administered 2016-08-05: 3 [IU] via SUBCUTANEOUS
  Administered 2016-08-06 – 2016-08-08 (×4): 2 [IU] via SUBCUTANEOUS
  Filled 2016-08-03: qty 1

## 2016-08-03 MED ORDER — HEPARIN SODIUM (PORCINE) 5000 UNIT/ML IJ SOLN
5000.0000 [IU] | Freq: Three times a day (TID) | INTRAMUSCULAR | Status: DC
  Administered 2016-08-03 – 2016-08-04 (×4): 5000 [IU] via SUBCUTANEOUS
  Filled 2016-08-03 (×4): qty 1

## 2016-08-03 MED ORDER — PROMETHAZINE HCL 25 MG PO TABS
12.5000 mg | ORAL_TABLET | Freq: Four times a day (QID) | ORAL | Status: DC | PRN
  Administered 2016-08-03: 12.5 mg via ORAL
  Filled 2016-08-03: qty 1

## 2016-08-03 MED ORDER — PIPERACILLIN-TAZOBACTAM 3.375 G IVPB 30 MIN
3.3750 g | Freq: Once | INTRAVENOUS | Status: AC
  Administered 2016-08-03: 3.375 g via INTRAVENOUS
  Filled 2016-08-03: qty 50

## 2016-08-03 MED ORDER — SODIUM CHLORIDE 0.9 % IV SOLN: 100.0000 mL | INTRAVENOUS | Status: DC | PRN

## 2016-08-03 MED ORDER — CALCIUM ACETATE (PHOS BINDER) 667 MG PO CAPS
1334.0000 mg | ORAL_CAPSULE | Freq: Three times a day (TID) | ORAL | Status: DC
  Administered 2016-08-04 – 2016-08-05 (×4): 1334 mg via ORAL
  Filled 2016-08-03 (×4): qty 2

## 2016-08-03 MED ORDER — INSULIN DETEMIR 100 UNIT/ML ~~LOC~~ SOLN
40.0000 [IU] | Freq: Every day | SUBCUTANEOUS | Status: DC
Start: 2016-08-03 — End: 2016-08-08
  Administered 2016-08-03 – 2016-08-07 (×5): 40 [IU] via SUBCUTANEOUS
  Filled 2016-08-03 (×8): qty 0.4

## 2016-08-03 MED ORDER — LIDOCAINE HCL (PF) 1 % IJ SOLN: 5.0000 mL | INTRAMUSCULAR | Status: DC | PRN

## 2016-08-03 MED ORDER — SODIUM CHLORIDE 0.9 % IV SOLN
1000.0000 mL | INTRAVENOUS | Status: DC
  Administered 2016-08-03: 1000 mL via INTRAVENOUS

## 2016-08-03 MED ORDER — HEPARIN SODIUM (PORCINE) 1000 UNIT/ML DIALYSIS
20.0000 [IU]/kg | INTRAMUSCULAR | Status: DC | PRN
Start: 2016-08-03 — End: 2016-08-04
  Filled 2016-08-03: qty 2

## 2016-08-03 MED ORDER — HEPARIN SODIUM (PORCINE) 1000 UNIT/ML DIALYSIS
1000.0000 [IU] | INTRAMUSCULAR | Status: DC | PRN
  Filled 2016-08-03: qty 1

## 2016-08-03 MED ORDER — ACETAMINOPHEN 325 MG PO TABS
650.0000 mg | ORAL_TABLET | Freq: Once | ORAL | Status: AC
  Administered 2016-08-03: 650 mg via ORAL
  Filled 2016-08-03: qty 2

## 2016-08-03 MED ORDER — LIDOCAINE-PRILOCAINE 2.5-2.5 % EX CREA
1.0000 "application " | TOPICAL_CREAM | CUTANEOUS | Status: DC | PRN
  Filled 2016-08-03: qty 5

## 2016-08-03 MED ORDER — INSULIN ASPART 100 UNIT/ML ~~LOC~~ SOLN
0.0000 [IU] | Freq: Every day | SUBCUTANEOUS | Status: DC
  Administered 2016-08-03 – 2016-08-07 (×2): 2 [IU] via SUBCUTANEOUS

## 2016-08-03 NOTE — ED Notes (Signed)
Rn notified of current BP and temp

## 2016-08-03 NOTE — H&P (Signed)
History and Physical    James [name] ONG:[id number] DOB: [birth year] DOA: 08/03/2016  PCP: Frederica Kuster, MD Patient coming from: Home  Chief Complaint: can't walk, weak  HPI: Legacy James [name] is a 60 y.o. male with medical history significant of ESRD, asthma, CHF, diabetes, HTN. Patient reports one day history of generalized weakness and inability to walk. States that symptoms came on gradually over the course of the day. Denies any other symptoms including chest pain, shortness of breath, cough, neck stiffness, headache, dysuria, frequency, abdominal pain, nausea, vomiting. Patient states that he does make urine from time to time. States that his home glucose readings around 200 since starting his new insulin regimen. Patient states that his right chest tunneled catheter has been in for approximately one year. Last dialysis was on 07/31/2016 typical dialysis is Tuesday Thursday Saturday.  Patient noted to have upper chest wall rash for which he said that he is currently finished treatment for by his primary care physician with the cream. This worked extremely well.   ED Course: Objective findings outlined below. Patient meets sepsis protocol. Patient started on beta myosin and Zosyn and given a 1 L IV bolus after patient became hypotensive. Pathology called for emergent dialysis.  Review of Systems: As per HPI otherwise 10 point review of systems negative.   Ambulatory Status: no restrictions  Past Medical History:  Diagnosis Date  . Asthma    as a child  . CHF (congestive heart failure) (HCC)   . Chronic kidney disease    TTHS  . Diabetes mellitus without complication (HCC)    Type II  . ESRD (end stage renal disease) (HCC)   . Hypertension     Past Surgical History:  Procedure Laterality Date  . AV FISTULA PLACEMENT Left 04/08/2015   Procedure: LEFT RADIOCEPHALIC ARTERIOVENOUS (AV) FISTULA CREATION;  Surgeon: Sherren Kerns, MD;  Location: [facility] OR;  Service: Vascular;   Laterality: Left;  . AV FISTULA PLACEMENT Right 09/15/2015   Procedure: RADIOCEPHALIC VERSUS BRACHIOCEPHALIC ARTERIOVENOUS (AV) FISTULA CREATION - RIGHT ARM;  Surgeon: Fransisco Hertz, MD;  Location[facility] OR;  Service: Vascular;  Laterality: Right;  . AV FISTULA PLACEMENT Left 06/07/2016   Procedure: LEFT BRACHIOCEPHALIC ARTERIOVENOUS (AV) FISTULA CREATION;  Surgeon: Sherren Kerns, MD;  Location: [facility] OR;  Service: Vascular;  Laterality: Left;  . COLONOSCOPY    . LIGATION OF ARTERIOVENOUS  FISTULA Left 09/15/2015   Procedure: LIGATION OF RADIOCEPHALIC ARTERIOVENOUS FISTULA - LEFT ARM;  Surgeon: Fransisco Hertz, MD;  Location: [facility] OR;  Service: Vascular;  Laterality: Left;  . LIGATION OF ARTERIOVENOUS  FISTULA Right 04/16/2016   Procedure: LIGATION OF ARTERIOVENOUS  FISTULA;  Surgeon: Sherren Kerns, MD;  Location: [facility] OR;  Service: Vascular;  Laterality: Right;  . LIGATION OF COMPETING BRANCHES OF ARTERIOVENOUS FISTULA Left 07/16/2015   Procedure: LIGATION OF SIDE BRANCHES OF ARTERIOVENOUS FISTULA LEFT ARM;  Surgeon: Larina Earthly, MD;  Location: [facility] OR;  Service: Vascular;  Laterality: Left;  . PERIPHERAL VASCULAR CATHETERIZATION N/A 07/04/2015   Procedure: Fistulagram;  Surgeon: Sherren Kerns, MD;  Location: [facility] INVASIVE CV LAB;  Service: Cardiovascular;  Laterality: N/A;  . PERIPHERAL VASCULAR CATHETERIZATION N/A 09/08/2015   Procedure: Fistulagram;  Surgeon: Fransisco Hertz, MD;  Location: [facility] INVASIVE CV LAB;  Service: Cardiovascular;  Laterality: N/A;  . PERIPHERAL VASCULAR CATHETERIZATION Right 02/23/2016   Procedure: Fistulagram;  Surgeon: Fransisco Hertz, MD;  Location: [facility] INVASIVE CV LAB;  Service: Cardiovascular;  Laterality: Right;  .  PERIPHERAL VASCULAR CATHETERIZATION N/A 04/12/2016   Procedure: Fistulagram;  Surgeon: Fransisco Hertz, MD;  Location: [facility] INVASIVE CV LAB;  Service: Cardiovascular;  Laterality: N/A;  . REVISON OF ARTERIOVENOUS FISTULA Right 03/10/2016   Procedure: RIGHT CEPHALIC VEIN TURNDOWN;   Surgeon: Fransisco Hertz, MD;  Location: [facility] OR;  Service: Vascular;  Laterality: Right;    Social History   Social History  . Marital status: Single    Spouse name: N/A  . Number of children: N/A  . Years of education: N/A   Occupational History  . Not on file.   Social History Main Topics  . Smoking status: Former Smoker    Years: 1.00    Types: Cigarettes    Quit date: 07/08/2005  . Smokeless tobacco: Never Used  . Alcohol use No  . Drug use: No  . Sexual activity: Not on file   Other Topics Concern  . Not on file   Social History Narrative  . No narrative on file    No Known Allergies  Family History  Problem Relation Age of Onset  . Hypertension Other     Prior to Admission medications   Medication Sig Start Date End Date Taking? Authorizing Provider  calcium acetate (PHOSLO) 667 MG capsule Take 2 capsules (1,334 mg total) by mouth 3 (three) times daily with meals. 04/12/15  Yes Elease Etienne, MD  Darbepoetin Alfa (ARANESP) 100 MCG/0.5ML SOSY injection Inject 0.5 mLs (100 mcg total) into the vein every Thursday with hemodialysis. Will be given at dialysis. 04/12/15  Yes Elease Etienne, MD  gabapentin (NEURONTIN) 300 MG capsule Take 1 capsule (300 mg total) by mouth at bedtime. 04/12/15  Yes Elease Etienne, MD  insulin aspart (NOVOLOG) 100 UNIT/ML injection Inject 6 Units into the skin 3 (three) times daily before meals. 07/28/16  Yes Frederica Kuster, MD  insulin detemir (LEVEMIR) 100 UNIT/ML injection Inject 40 Units into the skin at bedtime.    Yes Historical Provider, MD  isosorbide mononitrate (IMDUR) 60 MG 24 hr tablet Take 1 tablet (60 mg total) by mouth daily. 04/12/15  Yes Elease Etienne, MD  Vitamin D, Ergocalciferol, (DRISDOL) 50000 units CAPS capsule Take 50,000 Units by mouth every 7 (seven) days. Monday   Yes Historical Provider, MD    Physical Exam: Vitals:   08/03/16 1030 08/03/16 1054 08/03/16 1055 08/03/16 1131  BP: 95/57 96/66  91/59  Pulse: 106   100 100  Resp:    11  Temp:    98.5 F (36.9 C)  TempSrc:    Oral  SpO2: 93%  94% 93%  Weight:      Height:         General:  Appears calm and comfortable, sleeping in bed Eyes:  PERRL, EOMI, normal lids, ENT: Poor dentition, dry mucous membranes Neck: Left IJ in place, no masses or thyromegaly Cardiovascular: Difficult to appreciate cardiac sounds but regular rate and rhythm though tachycardic, 2/6 systolic murmur, no lower extremity edema bilaterally. Respiratory:  CTA bilaterally, no w/r/r. Normal respiratory effort. Abdomen:  soft, ntnd, NABS Skin: Right tunneled catheter in place with caked on excoriated tissue another skin matter with significant dirt on bandages. Several excoriated circumferential 1 cm lesions across upper chest with few that are open but mostly scarred and closed Musculoskeletal:  grossly normal tone BUE/BLE, good ROM, no bony abnormality Psychiatric:  grossly normal mood and affect, speech fluent and appropriate, AOx3 Neurologic:  CN 2-12 grossly intact, moves all extremities in  coordinated fashion, sensation intact  Labs on Admission: I have personally reviewed following labs and imaging studies  CBC:  Recent Labs Lab 08/03/16 0710 08/03/16 0731  WBC 15.3*  --   NEUTROABS 13.6*  --   HGB 10.6* 11.9*  HCT 32.3* 35.0*  MCV 88.0  --   PLT 217  --    Basic Metabolic Panel:  Recent Labs Lab 08/03/16 0710 08/03/16 0731  NA 124* 128*  K 4.1 4.1  CL 92* 93*  CO2 17*  --   GLUCOSE 375* 378*  BUN 53* 51*  CREATININE 11.12* 11.80*  CALCIUM 8.2*  --    GFR: Estimated Creatinine Clearance: 7.2 mL/min (by C-G formula based on SCr of 11.8 mg/dL). Liver Function Tests: No results for input(s): AST, ALT, ALKPHOS, BILITOT, PROT, ALBUMIN in the last 168 hours. No results for input(s): LIPASE, AMYLASE in the last 168 hours. No results for input(s): AMMONIA in the last 168 hours. Coagulation Profile:  Recent Labs Lab 08/03/16 0818  INR 1.43    Cardiac Enzymes:  Recent Labs Lab 08/03/16 0818  TROPONINI 0.06*   BNP (last 3 results) No results for input(s): PROBNP in the last 8760 hours. HbA1C: No results for input(s): HGBA1C in the last 72 hours. CBG: No results for input(s): GLUCAP in the last 168 hours. Lipid Profile: No results for input(s): CHOL, HDL, LDLCALC, TRIG, CHOLHDL, LDLDIRECT in the last 72 hours. Thyroid Function Tests: No results for input(s): TSH, T4TOTAL, FREET4, T3FREE, THYROIDAB in the last 72 hours. Anemia Panel: No results for input(s): VITAMINB12, FOLATE, FERRITIN, TIBC, IRON, RETICCTPCT in the last 72 hours. Urine analysis:    Component Value Date/Time   COLORURINE STRAW (A) 03/23/2015 0058   APPEARANCEUR CLEAR 03/23/2015 0058   LABSPEC 1.009 03/23/2015 0058   PHURINE 6.5 03/23/2015 0058   GLUCOSEU NEGATIVE 03/23/2015 0058   HGBUR MODERATE (A) 03/23/2015 0058   BILIRUBINUR NEGATIVE 03/23/2015 0058   KETONESUR NEGATIVE 03/23/2015 0058   PROTEINUR 100 (A) 03/23/2015 0058   UROBILINOGEN 0.2 03/23/2015 0058   NITRITE NEGATIVE 03/23/2015 0058   LEUKOCYTESUR NEGATIVE 03/23/2015 0058    Creatinine Clearance: Estimated Creatinine Clearance: 7.2 mL/min (by C-G formula based on SCr of 11.8 mg/dL).  Sepsis Labs: @LABRCNTIP (procalcitonin:4,lacticidven:4) ) Recent Results (from the past 240 hour(s))  Blood Culture (routine x 2)     Status: None (Preliminary result)   Collection Time: 08/03/16  8:15 AM  Result Value Ref Range Status   Specimen Description BLOOD RIGHT ANTECUBITAL  Final   Special Requests BOTTLES DRAWN AEROBIC AND ANAEROBIC  10CC  Final   Culture NO GROWTH <12 HOURS  Final   Report Status PENDING  Incomplete  Blood Culture (routine x 2)     Status: None (Preliminary result)   Collection Time: 08/03/16  8:20 AM  Result Value Ref Range Status   Specimen Description BLOOD RIGHT HAND  Final   Special Requests BOTTLES DRAWN AEROBIC ONLY  10CC  Final   Culture NO GROWTH <12 HOURS   Final   Report Status PENDING  Incomplete     Radiological Exams on Admission: Dg Chest 2 View  Result Date: 08/03/2016 CLINICAL DATA:  Chest pain, confusion, dialysis patient EXAM: CHEST  2 VIEW COMPARISON:  Chest x-ray of 09/15/2015 FINDINGS: No active infiltrate or effusion is seen. Mild linear atelectasis is noted at the lung bases. The heart is mildly enlarged and stable. Central venous catheter is unchanged in position. No bony abnormality is seen. IMPRESSION: Mild bibasilar linear atelectasis.  Stable mild cardiomegaly. Electronically Signed   By: Dwyane Dee M.D.   On: 08/03/2016 10:57    EKG: Independently reviewed.   Assessment/Plan Active Problems:   HTN (hypertension)   ESRD on dialysis (HCC)   Severe sepsis (HCC)   Anemia associated with chronic renal failure   Chronic systolic heart failure (HCC)   Hyponatremia   Elevated troponin   Sepsis: Source not immediately clear but suspect right chest wall tunneled catheter which has been present for approximately one year and is extremely dirty and overall appearance. Question patient's ability to maintain a clean catheter. Patient is becoming progressively hypotensive, temperature 104.6, tachycardia greater than 116, tachypnea, lactic acid 65, troponin 0.06, VBG showing pH of 7.4, PCO2 31, PCO2 32, bicarbonate 21, chest x-ray without evidence of acute process, WBC 15.3 with left shift. Patient makes urine and UA is pending - Stepdown - Cath tip culture after replacing R tunneled catheter by IR after dialysis.  - f/u BCX, UCX and UA - continue Vanc, Zosyn - IVF - close mgt as pt is ESRD/CHF  ESRD: Compliant with dialysis Tuesday Thursday Saturday. Last dialysis 07/31/2016. Metabolic derangements noted including sodium 128, potassium 4.1, chloride 93, BUN 51, creatinine 11.8, calcium 8.2. Discussed case with Dr. Lowell Guitar of nephrology who will take patient for emergent dialysis. - Dialysis per nephrology - Nephrology has graceously  agreed to arrange exchange of tunneled catheter w/ IR - continue renal vitamins  Elevated troponin:  0.06. Suspect combination of cardiac strain secondary to sepsis in the setting of decreased renal excretion to ESRD. EKG changes are typical for patient with his degree of sinus tachycardia. No overt signs of ACS. - Cycle trop - Tele  CHF: EF 45%. Currently compensated. Will need to monitor fluid status closely given Sepsis and ESRD. Dialysis planned for 8/29 - strict I/O, dailyt wts  Diabetes:  - SSI - continue Levemir  Anemia: Hgb 11. At baseline.  - continue Aranesp - CBC in am  Peripheral neuropathy: - continue neurontin  HTN: hypotension from sepsis - hold home Imdur until pressure improves   DVT prophylaxis: Hep  Code Status: full  Family Communication: none  Disposition Plan: pendingi mproveemnt  Consults called: nephrology  Admission status: inpt    Aaliyha Mumford J MD [facility]  If 7PM-7AM, please contact night-coverage [email or web address] Password [facility]  08/03/2016, 11:41 AM

## 2016-08-03 NOTE — ED Notes (Signed)
Pt condition and course discussed with family.

## 2016-08-03 NOTE — Consult Note (Signed)
[name] Renal Consultation Note    Indication for Consultation:  Management of ESRD/hemodialysis; anemia, hypertension/volume and secondary hyperparathyroidism PCP: Wardell Honour, MD  HPI: James [name] is a 60 y.o. male with ESRD 2/2 Collapsing FSGS (biospy proven 04/02/2015). He has hemodialysis T, Th, S at [facility]. PMH significant for DM, hypertension, CHF, Asthma, history of multiple failed AV accesses.   Patient presented to ED with C/O weakness/inability to walk. "I couldn't stand up". Onset of symptoms noticed this AM while preparing for hemodialysis. Bus driver couldn't help him stand, called EMS.  He presented to ED for evaluation. On arrival to ED, patient had oral temperature of 103, BP 139/67 HR 113 RR 24 O2 Sat 93 on RA. WBC 15.3, lactic acid 1.65.  Blood cultures were obtained and he was started on IVF bolus,Vancomycin and Zosyn. He has a RIJ TDC that was placed 04/04/2015. This is thought to be possible source of infection and arrangements have been made with interventional radiology for removal of TDC post HD today. He will have a 48 hour catheter holiday then have [place] re-inserted.   Currently he is being seen in hemodialysis unit prior to HD. RIJ catheter drsg is heavily soiled, insertion site with slight erythema, no purulent drainage. Patient denies fevers, chills despite high temp on arrival to ED, denies chest pain, cough, SOB, N, V, D, abdominal pain. Denies sick contacts other than patients in hemodialysis unit. Currently appears very tired in NAD.   Patient is generally compliant with hemodialysis. Last in-center treatment 07/31/2016. Patient stays full treatment, usually gets within acceptable range of EDW despite high IDWG. Has maturing LUA AVF, is to F/U with [facility] 1st week in September, 2017.    Past Medical History:  Diagnosis Date  . Asthma    as a child  . CHF (congestive heart failure) ([place])   . Chronic kidney disease    TTHS   . Diabetes mellitus without complication (HCC)    Type II  . ESRD (end stage renal disease) ([place])   . Hypertension    Past Surgical History:  Procedure Laterality Date  . AV FISTULA PLACEMENT Left 04/08/2015   Procedure: LEFT RADIOCEPHALIC ARTERIOVENOUS (AV) FISTULA CREATION;  Surgeon: Elam Dutch, MD;  Location: [place];  Service: Vascular;  Laterality: Left;  . AV FISTULA PLACEMENT Right 09/15/2015   Procedure: RADIOCEPHALIC VERSUS BRACHIOCEPHALIC ARTERIOVENOUS (AV) FISTULA CREATION - RIGHT ARM;  Surgeon: Conrad Crossnore, MD;  Location: [place];  Service: Vascular;  Laterality: Right;  . AV FISTULA PLACEMENT Left 06/07/2016   Procedure: LEFT BRACHIOCEPHALIC ARTERIOVENOUS (AV) FISTULA CREATION;  Surgeon: Elam Dutch, MD;  Location: [place];  Service: Vascular;  Laterality: Left;  . COLONOSCOPY    . LIGATION OF ARTERIOVENOUS  FISTULA Left 09/15/2015   Procedure: LIGATION OF RADIOCEPHALIC ARTERIOVENOUS FISTULA - LEFT ARM;  Surgeon: Conrad Hannibal, MD;  Location: [place];  Service: Vascular;  Laterality: Left;  . LIGATION OF ARTERIOVENOUS  FISTULA Right 04/16/2016   Procedure: LIGATION OF ARTERIOVENOUS  FISTULA;  Surgeon: Elam Dutch, MD;  Location: [place];  Service: Vascular;  Laterality: Right;  . LIGATION OF COMPETING BRANCHES OF ARTERIOVENOUS FISTULA Left 07/16/2015   Procedure: LIGATION OF SIDE BRANCHES OF ARTERIOVENOUS FISTULA LEFT ARM;  Surgeon: Rosetta Posner, MD;  Location: [place];  Service: Vascular;  Laterality: Left;  . PERIPHERAL VASCULAR CATHETERIZATION N/A 07/04/2015   Procedure: Fistulagram;  Surgeon: Elam Dutch, MD;  Location: [place] CV LAB;  Service: Cardiovascular;  Laterality: N/A;  . PERIPHERAL VASCULAR CATHETERIZATION N/A 09/08/2015   Procedure: Fistulagram;  Surgeon: Conrad Canyon Lake, MD;  Location: [place] CV [facility];  Service: Cardiovascular;  Laterality: N/A;  . PERIPHERAL VASCULAR CATHETERIZATION Right 02/23/2016   Procedure: Fistulagram;  Surgeon: Conrad Florence, MD;   Location: [place] [facility];  Service: Cardiovascular;  Laterality: Right;  . PERIPHERAL VASCULAR CATHETERIZATION N/A 04/12/2016   Procedure: Fistulagram;  Surgeon: Conrad Peever, MD;  Location: [place] CV [facility];  Service: Cardiovascular;  Laterality: N/A;  . REVISON OF ARTERIOVENOUS FISTULA Right 03/10/2016   Procedure: RIGHT CEPHALIC VEIN TURNDOWN;  Surgeon: Conrad Salamanca, MD;  Location: [place];  Service: Vascular;  Laterality: Right;   Family History  Problem Relation Age of Onset  . Hypertension Other    Social History:  reports that he quit smoking about 11 years ago. His smoking use included Cigarettes. He quit after 1.00 year of use. He has never used smokeless tobacco. He reports that he does not drink alcohol or use drugs. No Known Allergies Prior to Admission medications   Medication Sig Start Date End Date Taking? Authorizing Provider  calcium acetate (PHOSLO) 667 MG capsule Take 2 capsules (1,334 mg total) by mouth 3 (three) times daily with meals. 04/12/15  Yes Modena Jansky, MD  Darbepoetin Alfa (ARANESP) 100 MCG/0.5ML SOSY injection Inject 0.5 mLs (100 mcg total) into the vein every Thursday with hemodialysis. Will be given at dialysis. 04/12/15  Yes Modena Jansky, MD  gabapentin (NEURONTIN) 300 MG capsule Take 1 capsule (300 mg total) by mouth at bedtime. 04/12/15  Yes Modena Jansky, MD  insulin aspart (NOVOLOG) 100 UNIT/ML injection Inject 6 Units into the skin 3 (three) times daily before meals. 07/28/16  Yes Wardell Honour, MD  insulin detemir (LEVEMIR) 100 UNIT/ML injection Inject 40 Units into the skin at bedtime.    Yes Historical Provider, MD  isosorbide mononitrate (IMDUR) 60 MG 24 hr tablet Take 1 tablet (60 mg total) by mouth daily. 04/12/15  Yes Modena Jansky, MD  Vitamin D, Ergocalciferol, (DRISDOL) 50000 units CAPS capsule Take 50,000 Units by mouth every 7 (seven) days. Monday   Yes Historical Provider, MD   Current Facility-Administered Medications  Medication  Dose Route Frequency Provider Last Rate Last Dose  . 0.9 %  sodium chloride infusion  1,000 mL Intravenous Continuous Varney Biles, MD 125 mL/hr at 08/03/16 0905 1,000 mL at 08/03/16 0905  . 0.9 %  sodium chloride infusion  100 mL Intravenous PRN Estanislado Emms, MD      . 0.9 %  sodium chloride infusion  100 mL Intravenous PRN Estanislado Emms, MD      . alteplase (CATHFLO ACTIVASE) injection 2 mg  2 mg Intracatheter Once PRN Estanislado Emms, MD      . calcium acetate (PHOSLO) capsule 1,334 mg  1,334 mg Oral TID WC Waldemar Dickens, MD      . Derrill Memo ON 08/05/2016] Darbepoetin Alfa (ARANESP) injection 100 mcg  100 mcg Intravenous Q Thu-HD Waldemar Dickens, MD      . gabapentin (NEURONTIN) capsule 300 mg  300 mg Oral QHS Waldemar Dickens, MD      . heparin injection 1,000 Units  1,000 Units Dialysis PRN Estanislado Emms, MD      . heparin injection 1,900 Units  20 Units/kg Dialysis PRN Estanislado Emms, MD      . heparin injection 5,000 Units  5,000 Units Subcutaneous Q8H Waldemar Dickens, MD      . insulin aspart (novoLOG) injection 0-5 Units  0-5 Units Subcutaneous QHS Waldemar Dickens, MD      . insulin aspart (novoLOG) injection 0-9 Units  0-9 Units Subcutaneous TID WC Waldemar Dickens, MD   9 Units at 08/03/16 1255  . insulin detemir (LEVEMIR) injection 40 Units  40 Units Subcutaneous QHS Waldemar Dickens, MD      . lidocaine (PF) (XYLOCAINE) 1 % injection 5 mL  5 mL Intradermal PRN Estanislado Emms, MD      . lidocaine-prilocaine (EMLA) cream 1 application  1 application Topical PRN Estanislado Emms, MD      . pentafluoroprop-tetrafluoroeth Landry Dyke) aerosol 1 application  1 application Topical PRN Estanislado Emms, MD      . piperacillin-tazobactam (ZOSYN) IVPB 3.375 g  3.375 g Intravenous Q12H Lauren D Bajbus, RPH      . promethazine (PHENERGAN) tablet 12.5 mg  12.5 mg Oral Q6H PRN Waldemar Dickens, MD      . vancomycin (VANCOCIN) IVPB 1000 mg/200 mL premix  1,000 mg Intravenous Q T,Th,Sa-HD Lauren D Bajbus,  RPH       Current Outpatient Prescriptions  Medication Sig Dispense Refill  . calcium acetate (PHOSLO) 667 MG capsule Take 2 capsules (1,334 mg total) by mouth 3 (three) times daily with meals. 90 capsule 0  . Darbepoetin Alfa (ARANESP) 100 MCG/0.5ML SOSY injection Inject 0.5 mLs (100 mcg total) into the vein every Thursday with hemodialysis. Will be given at dialysis.    Marland Kitchen gabapentin (NEURONTIN) 300 MG capsule Take 1 capsule (300 mg total) by mouth at bedtime. 30 capsule 0  . insulin aspart (NOVOLOG) 100 UNIT/ML injection Inject 6 Units into the skin 3 (three) times daily before meals. 3 vial PRN  . insulin detemir (LEVEMIR) 100 UNIT/ML injection Inject 40 Units into the skin at bedtime.     . isosorbide mononitrate (IMDUR) 60 MG 24 hr tablet Take 1 tablet (60 mg total) by mouth daily. 30 tablet 0  . Vitamin D, Ergocalciferol, (DRISDOL) 50000 units CAPS capsule Take 50,000 Units by mouth every 7 (seven) days. Monday     Labs: Basic Metabolic Panel:  Recent Labs Lab 08/03/16 0710 08/03/16 0731  NA 124* 128*  K 4.1 4.1  CL 92* 93*  CO2 17*  --   GLUCOSE 375* 378*  BUN 53* 51*  CREATININE 11.12* 11.80*  CALCIUM 8.2*  --    CBC:  Recent Labs Lab 08/03/16 0710 08/03/16 0731  WBC 15.3*  --   NEUTROABS 13.6*  --   HGB 10.6* 11.9*  HCT 32.3* 35.0*  MCV 88.0  --   PLT 217  --    Cardiac Enzymes:  Recent Labs Lab 08/03/16 0818  TROPONINI 0.06*   CBG:  Recent Labs Lab 08/03/16 1247  GLUCAP 369*   Iron Studies: No results for input(s): IRON, TIBC, TRANSFERRIN, FERRITIN in the last 72 hours. Studies/Results: Dg Chest 2 View  Result Date: 08/03/2016 CLINICAL DATA:  Chest pain, confusion, dialysis patient EXAM: CHEST  2 VIEW COMPARISON:  Chest x-ray of 09/15/2015 FINDINGS: No active infiltrate or effusion is seen. Mild linear atelectasis is noted at the lung bases. The heart is mildly enlarged and stable. Central venous catheter is unchanged in position. No bony  abnormality is seen. IMPRESSION: Mild bibasilar linear atelectasis.  Stable mild cardiomegaly. Electronically Signed   By: Ivar Drape M.D.   On: 08/03/2016 10:57  ROS: As per HPI otherwise negative.   Physical Exam: Vitals:   08/03/16 1131 08/03/16 1200 08/03/16 1230 08/03/16 1245  BP: 91/59 142/78 133/80 140/75  Pulse: 100 97 96 96  Resp: 11 17 17 18   Temp: 98.5 F (36.9 C)  98.8 F (37.1 C)   TempSrc: Oral  Oral   SpO2: 93% (!) 88% 97% 97%  Weight:      Height:         General: Well developed, well nourished, Pleasant, cooperative,  in no acute distress. Head: Normocephalic, atraumatic, sclera non-icteric, mucus membranes are moist Neck: Supple. JVD not elevated. Lungs: Clear bilaterally to auscultation without wheezes, rales, or rhonchi. Breathing is unlabored. Heart: RRR with S1 S2. No murmurs, rubs, or gallops appreciated. Abdomen: Soft, non-tender, non-distended with normoactive bowel sounds. No rebound/guarding. No obvious abdominal masses. M-S:  Strength and tone appear normal for age. Lower extremities:without edema or ischemic changes, no open wounds  Neuro: Alert and oriented X 3. Moves all extremities spontaneously. Psych:  Responds to questions appropriately with a normal affect. Dialysis Access: RIJ TDC-drsg heavily soiled, insertion site with slight erythremia. LUA AVF +Bruit stronger in lower portion of AVF than upper portion.   Dialysis Orders: [facility], T,TH,S 4 hours 400/Auto 2.0 EDW 91 kg 2.0K/2.25 Ca Heparin 8000 units IV q treatment Mircera 75 mcg IV q 4 weeks (Last dose 07/27/16 Last in-center HGB 11.0, Fe 53 Tsat 25%) Calcitriol 0.5 mcg PO q tx (Last PTH 397 06/24/16)   Assessment/Plan: 1.  Sepsis/Possible TDC infection: Per primary. Rec'd 1 liter IVF in ED-had IVF infusing at 125 cc/hr. This has been stopped. WBC 15.3. Vanc/Zosyn started. Arrangements have been made with IR to remove TDC. Leave out for 48 hour catheter holiday.   2.  ESRD -  T,TH,S [place]. HD today on schedule. K+ 4.1. 2.0 K bath. Usual heparin dose.  3.  Hypertension/volume  - Take hydralazine/Metoprolol for hypertension. BP controlled at present. Standing wt 97.6 kg. Attempt UFG 3000.  4.  Anemia  - HGB 11.9. No ESA needed now. Follow HGB 5.  Metabolic bone disease -  Continue binders/VDRA. Add renal function panel to existing labs.  6.  Nutrition - Renal diet/renal vit/nepro 7.  CHF: Per primary. On beta blocker/isosorbide mononitrate  8.  DM: Per primary  Rita H. Owens Shark, NP-C 08/03/2016, 1:09 PM  [employer] [phone]  Renal Attending: Febrile syndrome associated with a soiled HD TDC that is non-tender.  We plan removal and antibiotic treatment. I agree with note as articulated by NP Owens Shark. Irva Loser C

## 2016-08-03 NOTE — Progress Notes (Signed)
PHARMACY - PHYSICIAN COMMUNICATION CRITICAL VALUE ALERT - BLOOD CULTURE IDENTIFICATION (BCID)  Results for orders placed or performed during the hospital encounter of 08/03/16  Blood Culture ID Panel (Reflexed) (Collected: 08/03/2016  8:15 AM)  Result Value Ref Range   Enterococcus species NOT DETECTED NOT DETECTED   Listeria monocytogenes NOT DETECTED NOT DETECTED   Staphylococcus species DETECTED (A) NOT DETECTED   Staphylococcus aureus DETECTED (A) NOT DETECTED   Methicillin resistance NOT DETECTED NOT DETECTED   Streptococcus species NOT DETECTED NOT DETECTED   Streptococcus agalactiae NOT DETECTED NOT DETECTED   Streptococcus pneumoniae NOT DETECTED NOT DETECTED   Streptococcus pyogenes NOT DETECTED NOT DETECTED   Acinetobacter baumannii NOT DETECTED NOT DETECTED   Enterobacteriaceae species NOT DETECTED NOT DETECTED   Enterobacter cloacae complex NOT DETECTED NOT DETECTED   Escherichia coli NOT DETECTED NOT DETECTED   Klebsiella oxytoca NOT DETECTED NOT DETECTED   Klebsiella pneumoniae NOT DETECTED NOT DETECTED   Proteus species NOT DETECTED NOT DETECTED   Serratia marcescens NOT DETECTED NOT DETECTED   Haemophilus influenzae NOT DETECTED NOT DETECTED   Neisseria meningitidis NOT DETECTED NOT DETECTED   Pseudomonas aeruginosa NOT DETECTED NOT DETECTED   Candida albicans NOT DETECTED NOT DETECTED   Candida glabrata NOT DETECTED NOT DETECTED   Candida krusei NOT DETECTED NOT DETECTED   Candida parapsilosis NOT DETECTED NOT DETECTED   Candida tropicalis NOT DETECTED NOT DETECTED    Name of physician (or Provider) Contacted: Text page sent to Forrest Moron, NP at [phone]  Changes to prescribed antibiotics required: Suggest narrow antibiotic therapy to Ancef only.  Recommend dose 2gm IV qHD.  Elmer Ramp 08/03/2016  9:08 PM

## 2016-08-03 NOTE — ED Notes (Signed)
Pt will let us know when he is ready to make urine. Pt states he only makes urine once or twice a day.

## 2016-08-03 NOTE — ED Notes (Signed)
IV attmepted x 1 wihtout success.

## 2016-08-03 NOTE — Progress Notes (Signed)
Pharmacy Antibiotic Note  James [name] is a 60 y.o. male admitted on 08/03/2016 with sepsis.  Pharmacy has been consulted for vancomycin and Zosyn dosing. ESRD on HD TTSat- was getting ready to go to HD today when he wasn't feeling well and came to the ED.  Plan: -Vancomycin 2g IV x1 as loading dose, then plans for 1g IV qHD -Zosyn 3.375g IV q12h EI  Height: 5\' 5"  (165.1 cm) Weight: 207 lb (93.9 kg) IBW/kg (Calculated) : 61.5  Temp (24hrs), Avg:104.6 F (40.3 C), Min:104.6 F (40.3 C), Max:104.6 F (40.3 C)   Recent Labs Lab 08/03/16 0710 08/03/16 0731  CREATININE 11.12* 11.80*    Estimated Creatinine Clearance: 7.2 mL/min (by C-G formula based on SCr of 11.8 mg/dL).    No Known Allergies  Antimicrobials this admission: Vanc 8/29 >>  Zosyn 8/29 >>   Dose adjustments this admission: n/a  Microbiology results: 8/29 BCx:  8/29 UCx:     Thank you for allowing pharmacy to be a part of this patient's care.  Hillarie Harrigan D. Issacc Merlo, PharmD, BCPS Clinical Pharmacist Pager: [phone] 08/03/2016 8:18 AM

## 2016-08-03 NOTE — Procedures (Signed)
Dialysis in progress, BP 118/76, BFR 400, using TDC.  HD catheter dressing looked like it had been wet.. Exit site and tunnel was nontender. James [name]

## 2016-08-03 NOTE — ED Notes (Signed)
Iv team unable to get iv. Attempted x 2 without success. Dr. Karle Starch at bedside for EJ attempt.

## 2016-08-03 NOTE — Progress Notes (Signed)
Patient ID: James [name], male   DOB: 1955/12/07, 60 y.o.   MRN: [id number]   Request received for HD catheter removal  Have spoken to Dr Florene Glen IR will remove catheter 8/29 pm  (if dialysis complete before 4pm) Or 8/30 am  2 day Holiday Then new tunneled catheter 8/31 or 9/1 this week

## 2016-08-03 NOTE — ED Notes (Signed)
Pt returned from xray

## 2016-08-03 NOTE — ED Triage Notes (Signed)
Patient arrived to ED via [facility]. EMS reports: patient lives alone at home. AOx4. Reports that he "didn't feel right last night" and took 1 BP medication (did not state medication). Patient takes dialysis. Was preparing for dialysis this morning, and reports that he was unable to ambulate or to dress himself d/t weakness.  ST on monitor. BP 130/80, Pulse 120, 98% on room air. 334 CBG (reports that is normal for him). Denies pain.  EMS reports that he "feels hot" but did not check his temperature.

## 2016-08-04 ENCOUNTER — Inpatient Hospital Stay (HOSPITAL_COMMUNITY): Payer: Medicare Other

## 2016-08-04 ENCOUNTER — Encounter (HOSPITAL_COMMUNITY): Payer: Self-pay

## 2016-08-04 DIAGNOSIS — B9561 Methicillin susceptible Staphylococcus aureus infection as the cause of diseases classified elsewhere: Secondary | ICD-10-CM

## 2016-08-04 DIAGNOSIS — R7881 Bacteremia: Secondary | ICD-10-CM

## 2016-08-04 HISTORY — PX: IR GENERIC HISTORICAL: IMG1180011

## 2016-08-04 LAB — COMPREHENSIVE METABOLIC PANEL
ALBUMIN: 2.6 g/dL — AB (ref 3.5–5.0)
ALK PHOS: 45 U/L (ref 38–126)
ALT: 9 U/L — AB (ref 17–63)
AST: 16 U/L (ref 15–41)
Anion gap: 8 (ref 5–15)
BILIRUBIN TOTAL: 0.5 mg/dL (ref 0.3–1.2)
BUN: 24 mg/dL — AB (ref 6–20)
CALCIUM: 8 mg/dL — AB (ref 8.9–10.3)
CO2: 28 mmol/L (ref 22–32)
CREATININE: 6.78 mg/dL — AB (ref 0.61–1.24)
Chloride: 96 mmol/L — ABNORMAL LOW (ref 101–111)
GFR calc Af Amer: 9 mL/min — ABNORMAL LOW (ref >60)
GFR, EST NON AFRICAN AMERICAN: 8 mL/min — AB (ref >60)
GLUCOSE: 201 mg/dL — AB (ref 65–99)
POTASSIUM: 3.2 mmol/L — AB (ref 3.5–5.1)
Sodium: 132 mmol/L — ABNORMAL LOW (ref 135–145)
TOTAL PROTEIN: 7.8 g/dL (ref 6.5–8.1)

## 2016-08-04 LAB — GLUCOSE, CAPILLARY
GLUCOSE-CAPILLARY: 197 mg/dL — AB (ref 65–99)
Glucose-Capillary: 154 mg/dL — ABNORMAL HIGH (ref 65–99)
Glucose-Capillary: 157 mg/dL — ABNORMAL HIGH (ref 65–99)
Glucose-Capillary: 179 mg/dL — ABNORMAL HIGH (ref 65–99)

## 2016-08-04 LAB — CBC
HEMATOCRIT: 32.1 % — AB (ref 39.0–52.0)
HEMOGLOBIN: 10.5 g/dL — AB (ref 13.0–17.0)
MCH: 29 pg (ref 26.0–34.0)
MCHC: 32.7 g/dL (ref 30.0–36.0)
MCV: 88.7 fL (ref 78.0–100.0)
Platelets: 186 10*3/uL (ref 150–400)
RBC: 3.62 MIL/uL — ABNORMAL LOW (ref 4.22–5.81)
RDW: 15.3 % (ref 11.5–15.5)
WBC: 15 10*3/uL — AB (ref 4.0–10.5)

## 2016-08-04 LAB — ECHOCARDIOGRAM COMPLETE
Height: 65 in
Weight: 3354.52 oz

## 2016-08-04 LAB — MRSA PCR SCREENING: MRSA BY PCR: NEGATIVE

## 2016-08-04 MED ORDER — POLYETHYLENE GLYCOL 3350 17 G PO PACK
17.0000 g | PACK | Freq: Every day | ORAL | Status: DC
  Administered 2016-08-05 – 2016-08-08 (×2): 17 g via ORAL
  Filled 2016-08-04 (×3): qty 1

## 2016-08-04 MED ORDER — SENNOSIDES-DOCUSATE SODIUM 8.6-50 MG PO TABS
1.0000 | ORAL_TABLET | Freq: Two times a day (BID) | ORAL | Status: DC
  Administered 2016-08-04 – 2016-08-08 (×6): 1 via ORAL
  Filled 2016-08-04 (×6): qty 1

## 2016-08-04 MED ORDER — CEFAZOLIN SODIUM-DEXTROSE 2-4 GM/100ML-% IV SOLN
2.0000 g | INTRAVENOUS | Status: DC
  Filled 2016-08-04: qty 100

## 2016-08-04 MED ORDER — LIDOCAINE HCL 1 % IJ SOLN
INTRAMUSCULAR | Status: AC
  Administered 2016-08-04: 5 mL
  Filled 2016-08-04: qty 20

## 2016-08-04 MED ORDER — ACETAMINOPHEN 325 MG PO TABS: 650.0000 mg | ORAL_TABLET | Freq: Four times a day (QID) | ORAL | Status: DC | PRN

## 2016-08-04 MED ORDER — CHLORHEXIDINE GLUCONATE 4 % EX LIQD
CUTANEOUS | Status: AC
  Administered 2016-08-04: 09:00:00
  Filled 2016-08-04: qty 15

## 2016-08-04 MED ORDER — BISACODYL 10 MG RE SUPP: 10.0000 mg | Freq: Every day | RECTAL | Status: DC | PRN

## 2016-08-04 NOTE — Procedures (Signed)
Successful removal of tunneled (R)IJ HD catheter. No complications Tip sent for culture as requested  Ascencion Dike PA-C Interventional Radiology 08/04/2016 9:07 AM

## 2016-08-04 NOTE — Plan of Care (Signed)
Problem: Safety: Goal: Ability to remain free from injury will improve Outcome: Progressing Instructed to call for assistance before getting OOB.   Problem: Physical Regulation: Goal: Will remain free from infection Outcome: Not Progressing Patient admitted with a likely infection HD catheter. On IV antibiotics. Blood cultures pending. HD catheter removed 08/04/16.  Problem: Nutrition: Goal: Adequate nutrition will be maintained Outcome: Completed/Met Date Met: 08/04/16 Excellent appetite.

## 2016-08-04 NOTE — Progress Notes (Signed)
  Echocardiogram 2D Echocardiogram has been performed.  James [name] 08/04/2016, 11:43 AM

## 2016-08-04 NOTE — Progress Notes (Signed)
MEDICATION RELATED CONSULT NOTE   IR Procedure Consult - Anticoagulant/Antiplatelet PTA/Inpatient Med List Review by Pharmacist    Procedure: Successful removal of tunneled (R)IJ HD catheter    Completed: 08/04/16   Post-Procedural bleeding risk per IR MD assessment:    Antithrombotic medications on inpatient or PTA profile prior to procedure:   Heparin 5000 units subcutaneously q 8 hours, this was not held for the procedure.    Pharmacy to sign off, thank you.

## 2016-08-04 NOTE — Consult Note (Signed)
[facility]    Date of Admission:  08/03/2016   Total days of antibiotics 2        Day 1 cefazolin               Reason for Consult: Automatic consultation for staph aureus bacteremia     Principal Problem:   Staphylococcus aureus bacteremia Active Problems:   Severe sepsis (HCC)   HTN (hypertension)   DM (diabetes mellitus) ([place])   ESRD on dialysis ([place])   Anemia   Multiple myeloma (HCC)   Anemia associated with chronic renal failure   Chronic systolic heart failure (HCC)   Hyponatremia   Elevated troponin   . calcium acetate  1,334 mg Oral TID WC  . [START ON 08/05/2016]  ceFAZolin (ANCEF) IV  2 g Intravenous Q T,Th,Sa-HD  . [START ON 08/05/2016] Darbepoetin Alfa  100 mcg Intravenous Q Thu-HD  . gabapentin  300 mg Oral QHS  . heparin  5,000 Units Subcutaneous Q8H  . insulin aspart  0-5 Units Subcutaneous QHS  . insulin aspart  0-9 Units Subcutaneous TID WC  . insulin detemir  40 Units Subcutaneous QHS    Recommendations: 1. Continue cefazolin 2. Repeat blood cultures 3. Transthoracic echocardiogram   Assessment: He has MSSA bacteremia, probably from his hemodialysis catheter. His antibiotic therapy has been appropriately narrow to cefazolin. Repeat cultures and echocardiogram results are pending.    HPI: James [name] is a 60 y.o. male with end-stage renal disease on hemodialysis via a catheter who became profoundly weak yesterday and was admitted with a fever of 104.6. Admission blood cultures have grown MSSA. His hemodialysis catheter has been removed. He is feeling better.   Review of Systems: Review of Systems  Constitutional: Positive for chills and fever. Negative for diaphoresis, malaise/fatigue and weight loss.  HENT: Negative for sore throat.   Respiratory: Negative for cough, sputum production and shortness of breath.   Cardiovascular: Negative for chest pain.  Gastrointestinal: Negative for abdominal pain,  diarrhea, nausea and vomiting.  Musculoskeletal: Negative for joint pain and myalgias.  Skin: Negative for rash.  Neurological: Positive for weakness. Negative for focal weakness and headaches.    Past Medical History:  Diagnosis Date  . Asthma    as a child  . CHF (congestive heart failure) ([place])   . Chronic kidney disease    TTHS  . Diabetes mellitus without complication (HCC)    Type II  . ESRD (end stage renal disease) ([place])   . Hypertension     Social History  Substance Use Topics  . Smoking status: Former Smoker    Years: 1.00    Types: Cigarettes    Quit date: 07/08/2005  . Smokeless tobacco: Never Used  . Alcohol use No    Family History  Problem Relation Age of Onset  . Hypertension Other    No Known Allergies  OBJECTIVE: Blood pressure 123/63, pulse 82, temperature 99.5 F (37.5 C), temperature source Oral, resp. rate 20, height 5' 5"  (1.651 m), weight 209 lb 10.5 oz (95.1 kg), SpO2 96 %.  Physical Exam  Constitutional: He is oriented to person, place, and time.  He is alert and in no distress. He is currently undergoing echocardiogram.  Cardiovascular: Normal rate and regular rhythm.   No murmur heard. Very distant heart sounds.  Pulmonary/Chest: Effort normal and breath sounds normal. He has no wheezes. He has no rales.  Clean, dry gauze  dressing over her previous right anterior chest catheter site.  Abdominal: Soft. There is no tenderness.  Musculoskeletal: Normal range of motion. He exhibits no edema or tenderness.  Bilateral upper arm AV fistulas without evidence of infection.  Neurological: He is alert and oriented to person, place, and time.  Skin: No rash noted.  Psychiatric: Mood and affect normal.    Lab Results Lab Results  Component Value Date   WBC 15.0 (H) 08/04/2016   HGB 10.5 (L) 08/04/2016   HCT 32.1 (L) 08/04/2016   MCV 88.7 08/04/2016   PLT 186 08/04/2016    Lab Results  Component Value Date   CREATININE 6.78 (H) 08/04/2016     BUN 24 (H) 08/04/2016   NA 132 (L) 08/04/2016   K 3.2 (L) 08/04/2016   CL 96 (L) 08/04/2016   CO2 28 08/04/2016    Lab Results  Component Value Date   ALT 9 (L) 08/04/2016   AST 16 08/04/2016   ALKPHOS 45 08/04/2016   BILITOT 0.5 08/04/2016     Microbiology: Recent Results (from the past 240 hour(s))  Blood Culture (routine x 2)     Status: Abnormal (Preliminary result)   Collection Time: 08/03/16  8:15 AM  Result Value Ref Range Status   Specimen Description BLOOD RIGHT ANTECUBITAL  Final   Special Requests BOTTLES DRAWN AEROBIC AND ANAEROBIC  10CC  Final   Culture  Setup Time   Final    GRAM POSITIVE COCCI IN CLUSTERS IN BOTH AEROBIC AND ANAEROBIC BOTTLES CRITICAL RESULT CALLED TO, READ BACK BY AND VERIFIED WITH: K. HIATT, PHARM AT 2049 ON [id number] BY Rhea Bleacher    Culture STAPHYLOCOCCUS AUREUS (A)  Final   Report Status PENDING  Incomplete  Blood Culture ID Panel (Reflexed)     Status: Abnormal   Collection Time: 08/03/16  8:15 AM  Result Value Ref Range Status   Enterococcus species NOT DETECTED NOT DETECTED Final   Listeria monocytogenes NOT DETECTED NOT DETECTED Final   Staphylococcus species DETECTED (A) NOT DETECTED Final    Comment: CRITICAL RESULT CALLED TO, READ BACK BY AND VERIFIED WITH: K. HIATT, PHARM AT 2049 ON [id number] BY S. YARBROUGH    Staphylococcus aureus DETECTED (A) NOT DETECTED Final    Comment: CRITICAL RESULT CALLED TO, READ BACK BY AND VERIFIED WITH: K. HIATT, PHARM AT 2049 ON [id number] BY S. YARBROUGH    Methicillin resistance NOT DETECTED NOT DETECTED Final   Streptococcus species NOT DETECTED NOT DETECTED Final   Streptococcus agalactiae NOT DETECTED NOT DETECTED Final   Streptococcus pneumoniae NOT DETECTED NOT DETECTED Final   Streptococcus pyogenes NOT DETECTED NOT DETECTED Final   Acinetobacter baumannii NOT DETECTED NOT DETECTED Final   Enterobacteriaceae species NOT DETECTED NOT DETECTED Final   Enterobacter cloacae complex NOT DETECTED  NOT DETECTED Final   Escherichia coli NOT DETECTED NOT DETECTED Final   Klebsiella oxytoca NOT DETECTED NOT DETECTED Final   Klebsiella pneumoniae NOT DETECTED NOT DETECTED Final   Proteus species NOT DETECTED NOT DETECTED Final   Serratia marcescens NOT DETECTED NOT DETECTED Final   Haemophilus influenzae NOT DETECTED NOT DETECTED Final   Neisseria meningitidis NOT DETECTED NOT DETECTED Final   Pseudomonas aeruginosa NOT DETECTED NOT DETECTED Final   Candida albicans NOT DETECTED NOT DETECTED Final   Candida glabrata NOT DETECTED NOT DETECTED Final   Candida krusei NOT DETECTED NOT DETECTED Final   Candida parapsilosis NOT DETECTED NOT DETECTED Final   Candida tropicalis NOT DETECTED NOT DETECTED Final  Blood Culture (routine x 2)     Status: Abnormal (Preliminary result)   Collection Time: 08/03/16  8:20 AM  Result Value Ref Range Status   Specimen Description BLOOD RIGHT HAND  Final   Special Requests BOTTLES DRAWN AEROBIC ONLY  10CC  Final   Culture  Setup Time   Final    GRAM POSITIVE COCCI IN CLUSTERS AEROBIC BOTTLE ONLY CRITICAL RESULT CALLED TO, READ BACK BY AND VERIFIED WITH: K. HIATT, PHARM AT 2049 ON [id number] BY Rhea Bleacher    Culture STAPHYLOCOCCUS AUREUS (A)  Final   Report Status PENDING  Incomplete  MRSA PCR Screening     Status: None   Collection Time: 08/03/16  8:54 PM  Result Value Ref Range Status   MRSA by PCR NEGATIVE NEGATIVE Final    Comment:        The GeneXpert MRSA Assay (FDA approved for NASAL specimens only), is one component of a comprehensive MRSA colonization surveillance program. It is not intended to diagnose MRSA infection nor to guide or monitor treatment for MRSA infections.     Michel Bickers, MD [facility] for [facility] [phone] pager   [phone] cell 08/04/2016, 11:34 AM

## 2016-08-04 NOTE — Progress Notes (Signed)
[facility] TEAM 1 - Stepdown/ICU TEAM  James [name]  ONG:[id number] DOB: [birth year] DOA: 08/03/2016 PCP: Frederica Kuster, MD    Brief Narrative:  60 y.o. male with history of ESRD, asthma, CHF, diabetes, and HTN who presented w/ a one day history of generalized weakness and inability to walk. In the ED he was found to have a temp of 103, HR 113, and RR 24 w/ WBC 15.3.  His RIJ TDC had been present since 04/04/2015.    Subjective: The patient states he feels much better overall.  He denies chest pain shortness of breath fevers chills nausea or vomiting.  He is anxious to get up and began moving around.  Assessment & Plan:  Sepsis due to Staph bacteremia (MSSA) - suspected HD cath infection  Antibiotics per ID - no evidence on TTE of endocarditis - will need recommendations from ID as to whether TEE required  ESRD on T/Th/Sat HD Nephrology following - dialysis access currently out  Elevated troponin Suspect this is simply stress-related in setting of ESRD - recheck in a.m. - no chest pain  Chronic systolic congestive heart failure EF on TTE this admission actually normal, which is an improvement since his last echo in 2016  DM CBG improving - continue to follow  Anemia of chronic kidney disease No evidence of acute blood loss - follow  Peripheral neuropathy  Hypertension Blood pressure currently well controlled  DVT prophylaxis: Subcutaneous heparin Code Status: FULL CODE Family Communication: no family present at time of exam  Disposition Plan: Stable for transfer to renal bed  Consultants:  Nephrology ID  Procedures: 8/30 TDC removal per IR  8/30 TTE - EF is 60-65% - wall motion normal - grade 1 diastolic dysfunction  Antimicrobials:  Zosyn 8/29  Vancomycin 8/29  Ceftaz 8/29 >  Objective: Blood pressure 136/77, pulse 92, temperature 99.1 F (37.3 C), temperature source Oral, resp. rate 17, height 5\' 5"  (1.651 m), weight 95.1 kg (209 lb 10.5 oz), SpO2 96  %.  Intake/Output Summary (Last 24 hours) at 08/04/16 1536 Last data filed at 08/04/16 0820  Gross per 24 hour  Intake          1004.17 ml  Output             3000 ml  Net         -1995.83 ml   Filed Weights   08/03/16 1805 08/03/16 1918 08/04/16 0313  Weight: 94.6 kg (208 lb 8.9 oz) 94.8 kg (208 lb 15.9 oz) 95.1 kg (209 lb 10.5 oz)    Examination: General: No acute respiratory distress Lungs: Clear to auscultation bilaterally without wheezes or crackles Cardiovascular: Regular rate and rhythm without murmur gallop or rub normal S1 and S2 Abdomen: Nontender, nondistended, soft, bowel sounds positive, no rebound, no ascites, no appreciable mass Extremities: No significant cyanosis, clubbing, or edema bilateral lower extremities  CBC:  Recent Labs Lab 08/03/16 0710 08/03/16 0731 08/03/16 2024 08/04/16 0359  WBC 15.3*  --  15.4* 15.0*  NEUTROABS 13.6*  --   --   --   HGB 10.6* 11.9* 11.2* 10.5*  HCT 32.3* 35.0* 34.6* 32.1*  MCV 88.0  --  89.4 88.7  PLT 217  --  189 186   Basic Metabolic Panel:  Recent Labs Lab 08/03/16 0710 08/03/16 0731 08/03/16 2024 08/04/16 0359  NA 124* 128* 131* 132*  K 4.1 4.1 3.3* 3.2*  CL 92* 93* 95* 96*  CO2 17*  --  24 28  GLUCOSE  375* 378* 190* 201*  BUN 53* 51* 19 24*  CREATININE 11.12* 11.80* 5.39* 6.78*  CALCIUM 8.2*  --  8.0* 8.0*  PHOS  --   --  1.9*  --    GFR: Estimated Creatinine Clearance: 12.6 mL/min (by C-G formula based on SCr of 6.78 mg/dL).  Liver Function Tests:  Recent Labs Lab 08/03/16 2024 08/04/16 0359  AST  --  16  ALT  --  9*  ALKPHOS  --  45  BILITOT  --  0.5  PROT  --  7.8  ALBUMIN 2.9* 2.6*   Coagulation Profile:  Recent Labs Lab 08/03/16 0818 08/03/16 2024  INR 1.43 1.41    Cardiac Enzymes:  Recent Labs Lab 08/03/16 0818  TROPONINI 0.06*    HbA1C: Hgb A1c MFr Bld  Date/Time Value Ref Range Status  03/22/2015 10:52 PM 7.2 (H) 4.8 - 5.6 % Final    Comment:    (NOTE)          Pre-diabetes: 5.7 - 6.4         Diabetes: >6.4         Glycemic control for adults with diabetes: <7.0     CBG:  Recent Labs Lab 08/03/16 1247 08/03/16 2107 08/04/16 0738 08/04/16 1145  GLUCAP 369* 223* 154* 197*    Recent Results (from the past 240 hour(s))  Blood Culture (routine x 2)     Status: Abnormal (Preliminary result)   Collection Time: 08/03/16  8:15 AM  Result Value Ref Range Status   Specimen Description BLOOD RIGHT ANTECUBITAL  Final   Special Requests BOTTLES DRAWN AEROBIC AND ANAEROBIC  10CC  Final   Culture  Setup Time   Final    GRAM POSITIVE COCCI IN CLUSTERS IN BOTH AEROBIC AND ANAEROBIC BOTTLES CRITICAL RESULT CALLED TO, READ BACK BY AND VERIFIED WITH: K. HIATT, PHARM AT 2049 ON [id number] BY Lucienne Capers    Culture STAPHYLOCOCCUS AUREUS (A)  Final   Report Status PENDING  Incomplete  Blood Culture ID Panel (Reflexed)     Status: Abnormal   Collection Time: 08/03/16  8:15 AM  Result Value Ref Range Status   Enterococcus species NOT DETECTED NOT DETECTED Final   Listeria monocytogenes NOT DETECTED NOT DETECTED Final   Staphylococcus species DETECTED (A) NOT DETECTED Final    Comment: CRITICAL RESULT CALLED TO, READ BACK BY AND VERIFIED WITH: K. HIATT, PHARM AT 2049 ON [id number] BY S. YARBROUGH    Staphylococcus aureus DETECTED (A) NOT DETECTED Final    Comment: CRITICAL RESULT CALLED TO, READ BACK BY AND VERIFIED WITH: K. HIATT, PHARM AT 2049 ON [id number] BY S. YARBROUGH    Methicillin resistance NOT DETECTED NOT DETECTED Final   Streptococcus species NOT DETECTED NOT DETECTED Final   Streptococcus agalactiae NOT DETECTED NOT DETECTED Final   Streptococcus pneumoniae NOT DETECTED NOT DETECTED Final   Streptococcus pyogenes NOT DETECTED NOT DETECTED Final   Acinetobacter baumannii NOT DETECTED NOT DETECTED Final   Enterobacteriaceae species NOT DETECTED NOT DETECTED Final   Enterobacter cloacae complex NOT DETECTED NOT DETECTED Final   Escherichia coli NOT  DETECTED NOT DETECTED Final   Klebsiella oxytoca NOT DETECTED NOT DETECTED Final   Klebsiella pneumoniae NOT DETECTED NOT DETECTED Final   Proteus species NOT DETECTED NOT DETECTED Final   Serratia marcescens NOT DETECTED NOT DETECTED Final   Haemophilus influenzae NOT DETECTED NOT DETECTED Final   Neisseria meningitidis NOT DETECTED NOT DETECTED Final   Pseudomonas aeruginosa NOT DETECTED NOT DETECTED Final  Candida albicans NOT DETECTED NOT DETECTED Final   Candida glabrata NOT DETECTED NOT DETECTED Final   Candida krusei NOT DETECTED NOT DETECTED Final   Candida parapsilosis NOT DETECTED NOT DETECTED Final   Candida tropicalis NOT DETECTED NOT DETECTED Final  Blood Culture (routine x 2)     Status: Abnormal (Preliminary result)   Collection Time: 08/03/16  8:20 AM  Result Value Ref Range Status   Specimen Description BLOOD RIGHT HAND  Final   Special Requests BOTTLES DRAWN AEROBIC ONLY  10CC  Final   Culture  Setup Time   Final    GRAM POSITIVE COCCI IN CLUSTERS AEROBIC BOTTLE ONLY CRITICAL RESULT CALLED TO, READ BACK BY AND VERIFIED WITH: K. HIATT, PHARM AT 2049 ON [id number] BY Lucienne Capers    Culture STAPHYLOCOCCUS AUREUS (A)  Final   Report Status PENDING  Incomplete  MRSA PCR Screening     Status: None   Collection Time: 08/03/16  8:54 PM  Result Value Ref Range Status   MRSA by PCR NEGATIVE NEGATIVE Final    Comment:        The GeneXpert MRSA Assay (FDA approved for NASAL specimens only), is one component of a comprehensive MRSA colonization surveillance program. It is not intended to diagnose MRSA infection nor to guide or monitor treatment for MRSA infections.   Culture, blood (routine x 2)     Status: None (Preliminary result)   Collection Time: 08/04/16 12:25 PM  Result Value Ref Range Status   Specimen Description BLOOD RIGHT HAND  Final   Special Requests IN PEDIATRIC BOTTLE 1.0CC  Final   Culture NO GROWTH <12 HOURS  Final   Report Status PENDING  Incomplete   Culture, blood (routine x 2)     Status: None (Preliminary result)   Collection Time: 08/04/16 12:28 PM  Result Value Ref Range Status   Specimen Description BLOOD RIGHT HAND  Final   Special Requests BOTTLES DRAWN AEROBIC ONLY 5CC  Final   Culture NO GROWTH <12 HOURS  Final   Report Status PENDING  Incomplete     Scheduled Meds: . calcium acetate  1,334 mg Oral TID WC  . [START ON 08/05/2016]  ceFAZolin (ANCEF) IV  2 g Intravenous Q T,Th,Sa-HD  . [START ON 08/05/2016] Darbepoetin Alfa  100 mcg Intravenous Q Thu-HD  . gabapentin  300 mg Oral QHS  . heparin  5,000 Units Subcutaneous Q8H  . insulin aspart  0-5 Units Subcutaneous QHS  . insulin aspart  0-9 Units Subcutaneous TID WC  . insulin detemir  40 Units Subcutaneous QHS   Continuous Infusions: . sodium chloride Stopped (08/03/16 1343)     LOS: 1 day   Lonia Blood, MD [facility] Office  [phone] Pager - Text Page per Loretha Stapler as per below:  On-Call/Text Page:      [email or web address]      password [email or web address]  If 7PM-7AM, please contact night-coverage [email or web address] Password [email or web address] 08/04/2016, 3:36 PM

## 2016-08-04 NOTE — Progress Notes (Signed)
Patient arrived to unit from 3S. Patient oriented to room and plan of care. Patient placed on cardiac monitor box 13. No signs of distress. Patient denies pain. [place] patient's RN updated on patient's arrival.

## 2016-08-05 DIAGNOSIS — N186 End stage renal disease: Secondary | ICD-10-CM | POA: Diagnosis not present

## 2016-08-05 DIAGNOSIS — N041 Nephrotic syndrome with focal and segmental glomerular lesions: Secondary | ICD-10-CM | POA: Diagnosis not present

## 2016-08-05 DIAGNOSIS — Z992 Dependence on renal dialysis: Secondary | ICD-10-CM | POA: Diagnosis not present

## 2016-08-05 LAB — CULTURE, BLOOD (ROUTINE X 2)

## 2016-08-05 LAB — COMPREHENSIVE METABOLIC PANEL
ALK PHOS: 44 U/L (ref 38–126)
ALT: 9 U/L — ABNORMAL LOW (ref 17–63)
ANION GAP: 11 (ref 5–15)
AST: 15 U/L (ref 15–41)
Albumin: 2.3 g/dL — ABNORMAL LOW (ref 3.5–5.0)
BILIRUBIN TOTAL: 0.6 mg/dL (ref 0.3–1.2)
BUN: 41 mg/dL — ABNORMAL HIGH (ref 6–20)
CALCIUM: 8.3 mg/dL — AB (ref 8.9–10.3)
CO2: 25 mmol/L (ref 22–32)
Chloride: 93 mmol/L — ABNORMAL LOW (ref 101–111)
Creatinine, Ser: 8.92 mg/dL — ABNORMAL HIGH (ref 0.61–1.24)
GFR, EST AFRICAN AMERICAN: 7 mL/min — AB (ref >60)
GFR, EST NON AFRICAN AMERICAN: 6 mL/min — AB (ref >60)
Glucose, Bld: 134 mg/dL — ABNORMAL HIGH (ref 65–99)
POTASSIUM: 3.1 mmol/L — AB (ref 3.5–5.1)
Sodium: 129 mmol/L — ABNORMAL LOW (ref 135–145)
TOTAL PROTEIN: 7.6 g/dL (ref 6.5–8.1)

## 2016-08-05 LAB — CBC
HEMATOCRIT: 31 % — AB (ref 39.0–52.0)
HEMOGLOBIN: 9.8 g/dL — AB (ref 13.0–17.0)
MCH: 28.3 pg (ref 26.0–34.0)
MCHC: 31.6 g/dL (ref 30.0–36.0)
MCV: 89.6 fL (ref 78.0–100.0)
Platelets: 181 10*3/uL (ref 150–400)
RBC: 3.46 MIL/uL — ABNORMAL LOW (ref 4.22–5.81)
RDW: 15.2 % (ref 11.5–15.5)
WBC: 9.7 10*3/uL (ref 4.0–10.5)

## 2016-08-05 LAB — GLUCOSE, CAPILLARY
GLUCOSE-CAPILLARY: 109 mg/dL — AB (ref 65–99)
GLUCOSE-CAPILLARY: 182 mg/dL — AB (ref 65–99)
Glucose-Capillary: 225 mg/dL — ABNORMAL HIGH (ref 65–99)
Glucose-Capillary: 176 mg/dL — ABNORMAL HIGH (ref 65–99)

## 2016-08-05 LAB — TROPONIN I: TROPONIN I: 0.03 ng/mL — AB (ref <0.03)

## 2016-08-05 MED ORDER — CEFAZOLIN IN D5W 1 GM/50ML IV SOLN
1.0000 g | Freq: Once | INTRAVENOUS | Status: DC
  Filled 2016-08-05: qty 50

## 2016-08-05 MED ORDER — CEFAZOLIN SODIUM-DEXTROSE 2-4 GM/100ML-% IV SOLN
2.0000 g | Freq: Once | INTRAVENOUS | Status: AC
  Administered 2016-08-05: 2 g via INTRAVENOUS
  Filled 2016-08-05: qty 100

## 2016-08-05 MED ORDER — HEPARIN SODIUM (PORCINE) 5000 UNIT/ML IJ SOLN
5000.0000 [IU] | Freq: Three times a day (TID) | INTRAMUSCULAR | Status: DC
  Administered 2016-08-07 – 2016-08-08 (×3): 5000 [IU] via SUBCUTANEOUS
  Filled 2016-08-05 (×3): qty 1

## 2016-08-05 MED ORDER — POTASSIUM CHLORIDE ER 10 MEQ PO TBCR
20.0000 meq | EXTENDED_RELEASE_TABLET | Freq: Once | ORAL | Status: AC
  Administered 2016-08-05: 20 meq via ORAL
  Filled 2016-08-05 (×2): qty 2

## 2016-08-05 MED ORDER — CALCITRIOL 0.5 MCG PO CAPS
0.5000 ug | ORAL_CAPSULE | ORAL | Status: DC
  Administered 2016-08-07: 0.5 ug via ORAL

## 2016-08-05 MED ORDER — CEFAZOLIN SODIUM-DEXTROSE 2-4 GM/100ML-% IV SOLN
2.0000 g | INTRAVENOUS | Status: DC
  Administered 2016-08-07: 2 g via INTRAVENOUS
  Filled 2016-08-05: qty 100

## 2016-08-05 MED ORDER — CEFAZOLIN SODIUM-DEXTROSE 2-4 GM/100ML-% IV SOLN: 2.0000 g | INTRAVENOUS | Status: DC

## 2016-08-05 MED ORDER — PRO-STAT SUGAR FREE PO LIQD
30.0000 mL | Freq: Two times a day (BID) | ORAL | Status: DC
Start: 2016-08-05 — End: 2016-08-08
  Administered 2016-08-05 – 2016-08-06 (×3): 30 mL via ORAL
  Administered 2016-08-07: 22:00:00 via ORAL
  Administered 2016-08-07 – 2016-08-08 (×2): 30 mL via ORAL
  Filled 2016-08-05 (×6): qty 30

## 2016-08-05 NOTE — Consult Note (Signed)
Referring Physician:  Ildefonso Keaney is an 60 y.o. male.                       Chief Complaint: MSSA bacteremia in renal patient.  HPI: 60 year old male has recent history of MSSA infection probably from dialysis catheter and treated with cefazolin. He needs TEE to guide duration of therapy. Patient denies chest pain or shortness of breath.  Past Medical History:  Diagnosis Date  . Asthma    as a child  . CHF (congestive heart failure) ([place])   . Chronic kidney disease    TTHS  . Diabetes mellitus without complication (HCC)    Type II  . ESRD (end stage renal disease) ([place])   . Hypertension       Past Surgical History:  Procedure Laterality Date  . AV FISTULA PLACEMENT Left 04/08/2015   Procedure: LEFT RADIOCEPHALIC ARTERIOVENOUS (AV) FISTULA CREATION;  Surgeon: Elam Dutch, MD;  Location: [place];  Service: Vascular;  Laterality: Left;  . AV FISTULA PLACEMENT Right 09/15/2015   Procedure: RADIOCEPHALIC VERSUS BRACHIOCEPHALIC ARTERIOVENOUS (AV) FISTULA CREATION - RIGHT ARM;  Surgeon: Conrad Euclid, MD;  Location: [place];  Service: Vascular;  Laterality: Right;  . AV FISTULA PLACEMENT Left 06/07/2016   Procedure: LEFT BRACHIOCEPHALIC ARTERIOVENOUS (AV) FISTULA CREATION;  Surgeon: Elam Dutch, MD;  Location: [place];  Service: Vascular;  Laterality: Left;  . COLONOSCOPY    . IR GENERIC HISTORICAL  08/04/2016   IR REMOVAL TUN CV CATH W/O FL 08/04/2016 Ascencion Dike, PA-C [facility]INTER[facility]  . LIGATION OF ARTERIOVENOUS  FISTULA Left 09/15/2015   Procedure: LIGATION OF RADIOCEPHALIC ARTERIOVENOUS FISTULA - LEFT ARM;  Surgeon: Conrad Folcroft, MD;  Location: [place];  Service: Vascular;  Laterality: Left;  . LIGATION OF ARTERIOVENOUS  FISTULA Right 04/16/2016   Procedure: LIGATION OF ARTERIOVENOUS  FISTULA;  Surgeon: Elam Dutch, MD;  Location: [place];  Service: Vascular;  Laterality: Right;  . LIGATION OF COMPETING BRANCHES OF ARTERIOVENOUS FISTULA Left 07/16/2015   Procedure: LIGATION OF SIDE  BRANCHES OF ARTERIOVENOUS FISTULA LEFT ARM;  Surgeon: Rosetta Posner, MD;  Location: [place];  Service: Vascular;  Laterality: Left;  . PERIPHERAL VASCULAR CATHETERIZATION N/A 07/04/2015   Procedure: Fistulagram;  Surgeon: Elam Dutch, MD;  Location: [place] [facility];  Service: Cardiovascular;  Laterality: N/A;  . PERIPHERAL VASCULAR CATHETERIZATION N/A 09/08/2015   Procedure: Fistulagram;  Surgeon: Conrad Dumont, MD;  Location: [place] [facility];  Service: Cardiovascular;  Laterality: N/A;  . PERIPHERAL VASCULAR CATHETERIZATION Right 02/23/2016   Procedure: Fistulagram;  Surgeon: Conrad [place], MD;  Location: [place] [facility];  Service: Cardiovascular;  Laterality: Right;  . PERIPHERAL VASCULAR CATHETERIZATION N/A 04/12/2016   Procedure: Fistulagram;  Surgeon: Conrad , MD;  Location: [place] [facility];  Service: Cardiovascular;  Laterality: N/A;  . REVISON OF ARTERIOVENOUS FISTULA Right 03/10/2016   Procedure: RIGHT CEPHALIC VEIN TURNDOWN;  Surgeon: Conrad , MD;  Location: [place];  Service: Vascular;  Laterality: Right;    Family History  Problem Relation Age of Onset  . Hypertension Other    Social History:  reports that he quit smoking about 11 years ago. His smoking use included Cigarettes. He quit after 1.00 year of use. He has never used smokeless tobacco. He reports that he does not drink alcohol or use drugs.  Allergies: No Known Allergies  Medications Prior to Admission  Medication Sig  Dispense Refill  . calcium acetate (PHOSLO) 667 MG capsule Take 2 capsules (1,334 mg total) by mouth 3 (three) times daily with meals. 90 capsule 0  . Darbepoetin Alfa (ARANESP) 100 MCG/0.5ML SOSY injection Inject 0.5 mLs (100 mcg total) into the vein every Thursday with hemodialysis. Will be given at dialysis.    Marland Kitchen gabapentin (NEURONTIN) 300 MG capsule Take 1 capsule (300 mg total) by mouth at bedtime. 30 capsule 0  . insulin aspart (NOVOLOG) 100 UNIT/ML injection Inject 6 Units into the skin  3 (three) times daily before meals. 3 vial PRN  . insulin detemir (LEVEMIR) 100 UNIT/ML injection Inject 40 Units into the skin at bedtime.     . isosorbide mononitrate (IMDUR) 60 MG 24 hr tablet Take 1 tablet (60 mg total) by mouth daily. 30 tablet 0  . Vitamin D, Ergocalciferol, (DRISDOL) 50000 units CAPS capsule Take 50,000 Units by mouth every 7 (seven) days. Monday      Results for orders placed or performed during the hospital encounter of 08/03/16 (from the past 48 hour(s))  Procalcitonin     Status: None   Collection Time: 08/03/16  8:24 PM  Result Value Ref Range   Procalcitonin 148.84 ng/mL    Comment:        Interpretation: PCT >= 10 ng/mL: Important systemic inflammatory response, almost exclusively due to severe bacterial sepsis or septic shock. (NOTE)         ICU PCT Algorithm               Non ICU PCT Algorithm    ----------------------------     ------------------------------         PCT < 0.25 ng/mL                 PCT < 0.1 ng/mL     Stopping of antibiotics            Stopping of antibiotics       strongly encouraged.               strongly encouraged.    ----------------------------     ------------------------------       PCT level decrease by               PCT < 0.25 ng/mL       >= 80% from peak PCT       OR PCT 0.25 - 0.5 ng/mL          Stopping of antibiotics                                             encouraged.     Stopping of antibiotics           encouraged.    ----------------------------     ------------------------------       PCT level decrease by              PCT >= 0.25 ng/mL       < 80% from peak PCT        AND PCT >= 0.5 ng/mL             Continuing antibiotics  encouraged.       Continuing antibiotics            encouraged.    ----------------------------     ------------------------------     PCT level increase compared          PCT > 0.5 ng/mL         with peak PCT AND          PCT >= 0.5 ng/mL              Escalation of antibiotics                                          strongly encouraged.      Escalation of antibiotics        strongly encouraged.   Protime-INR     Status: Abnormal   Collection Time: 08/03/16  8:24 PM  Result Value Ref Range   Prothrombin Time 17.4 (H) 11.4 - 15.2 seconds   INR 1.41   APTT     Status: None   Collection Time: 08/03/16  8:24 PM  Result Value Ref Range   aPTT 29 24 - 36 seconds  Renal function panel     Status: Abnormal   Collection Time: 08/03/16  8:24 PM  Result Value Ref Range   Sodium 131 (L) 135 - 145 mmol/L   Potassium 3.3 (L) 3.5 - 5.1 mmol/L    Comment: DELTA CHECK NOTED   Chloride 95 (L) 101 - 111 mmol/L   CO2 24 22 - 32 mmol/L   Glucose, Bld 190 (H) 65 - 99 mg/dL   BUN 19 6 - 20 mg/dL   Creatinine, Ser 5.39 (H) 0.61 - 1.24 mg/dL    Comment: DIALYSIS   Calcium 8.0 (L) 8.9 - 10.3 mg/dL   Phosphorus 1.9 (L) 2.5 - 4.6 mg/dL   Albumin 2.9 (L) 3.5 - 5.0 g/dL   GFR calc non Af Amer 11 (L) >60 mL/min   GFR calc Af Amer 12 (L) >60 mL/min    Comment: (NOTE) The eGFR has been calculated using the CKD EPI equation. This calculation has not been validated in all clinical situations. eGFR's persistently <60 mL/min signify possible Chronic Kidney Disease.    Anion gap 12 5 - 15  CBC     Status: Abnormal   Collection Time: 08/03/16  8:24 PM  Result Value Ref Range   WBC 15.4 (H) 4.0 - 10.5 K/uL   RBC 3.87 (L) 4.22 - 5.81 MIL/uL   Hemoglobin 11.2 (L) 13.0 - 17.0 g/dL   HCT 34.6 (L) 39.0 - 52.0 %   MCV 89.4 78.0 - 100.0 fL   MCH 28.9 26.0 - 34.0 pg   MCHC 32.4 30.0 - 36.0 g/dL   RDW 15.3 11.5 - 15.5 %   Platelets 189 150 - 400 K/uL  MRSA PCR Screening     Status: None   Collection Time: 08/03/16  8:54 PM  Result Value Ref Range   MRSA by PCR NEGATIVE NEGATIVE    Comment:        The GeneXpert MRSA Assay (FDA approved for NASAL specimens only), is one component of a comprehensive MRSA colonization surveillance program. It is  not intended to diagnose MRSA infection nor to guide or monitor treatment for MRSA infections.   Glucose, capillary     Status: Abnormal   Collection Time: 08/03/16  9:07  PM  Result Value Ref Range   Glucose-Capillary 223 (H) 65 - 99 mg/dL  CBC     Status: Abnormal   Collection Time: 08/04/16  3:59 AM  Result Value Ref Range   WBC 15.0 (H) 4.0 - 10.5 K/uL   RBC 3.62 (L) 4.22 - 5.81 MIL/uL   Hemoglobin 10.5 (L) 13.0 - 17.0 g/dL   HCT 32.1 (L) 39.0 - 52.0 %   MCV 88.7 78.0 - 100.0 fL   MCH 29.0 26.0 - 34.0 pg   MCHC 32.7 30.0 - 36.0 g/dL   RDW 15.3 11.5 - 15.5 %   Platelets 186 150 - 400 K/uL  Comprehensive metabolic panel     Status: Abnormal   Collection Time: 08/04/16  3:59 AM  Result Value Ref Range   Sodium 132 (L) 135 - 145 mmol/L   Potassium 3.2 (L) 3.5 - 5.1 mmol/L   Chloride 96 (L) 101 - 111 mmol/L   CO2 28 22 - 32 mmol/L   Glucose, Bld 201 (H) 65 - 99 mg/dL   BUN 24 (H) 6 - 20 mg/dL   Creatinine, Ser 6.78 (H) 0.61 - 1.24 mg/dL   Calcium 8.0 (L) 8.9 - 10.3 mg/dL   Total Protein 7.8 6.5 - 8.1 g/dL   Albumin 2.6 (L) 3.5 - 5.0 g/dL   AST 16 15 - 41 U/L   ALT 9 (L) 17 - 63 U/L   Alkaline Phosphatase 45 38 - 126 U/L   Total Bilirubin 0.5 0.3 - 1.2 mg/dL   GFR calc non Af Amer 8 (L) >60 mL/min   GFR calc Af Amer 9 (L) >60 mL/min    Comment: (NOTE) The eGFR has been calculated using the CKD EPI equation. This calculation has not been validated in all clinical situations. eGFR's persistently <60 mL/min signify possible Chronic Kidney Disease.    Anion gap 8 5 - 15  Glucose, capillary     Status: Abnormal   Collection Time: 08/04/16  7:38 AM  Result Value Ref Range   Glucose-Capillary 154 (H) 65 - 99 mg/dL   Comment 1 Notify RN    Comment 2 Document in Chart   Cath Tip Culture     Status: None (Preliminary result)   Collection Time: 08/04/16  9:23 AM  Result Value Ref Range   Specimen Description CATH TIP    Special Requests NONE    Culture CULTURE REINCUBATED  FOR BETTER GROWTH    Report Status PENDING   Glucose, capillary     Status: Abnormal   Collection Time: 08/04/16 11:45 AM  Result Value Ref Range   Glucose-Capillary 197 (H) 65 - 99 mg/dL   Comment 1 Notify RN    Comment 2 Document in Chart   Culture, blood (routine x 2)     Status: None (Preliminary result)   Collection Time: 08/04/16 12:25 PM  Result Value Ref Range   Specimen Description BLOOD RIGHT HAND    Special Requests IN PEDIATRIC BOTTLE 1.0CC    Culture  Setup Time      GRAM POSITIVE COCCI IN CLUSTERS IN PEDIATRIC BOTTLE CRITICAL VALUE NOTED.  VALUE IS CONSISTENT WITH PREVIOUSLY REPORTED AND CALLED VALUE.    Culture GRAM POSITIVE COCCI    Report Status PENDING   Culture, blood (routine x 2)     Status: None (Preliminary result)   Collection Time: 08/04/16 12:28 PM  Result Value Ref Range   Specimen Description BLOOD RIGHT HAND    Special Requests BOTTLES DRAWN AEROBIC  ONLY 5CC    Culture NO GROWTH 1 DAY    Report Status PENDING   Glucose, capillary     Status: Abnormal   Collection Time: 08/04/16  5:53 PM  Result Value Ref Range   Glucose-Capillary 157 (H) 65 - 99 mg/dL   Comment 1 Notify RN    Comment 2 Document in Chart   Glucose, capillary     Status: Abnormal   Collection Time: 08/04/16  9:19 PM  Result Value Ref Range   Glucose-Capillary 179 (H) 65 - 99 mg/dL  CBC     Status: Abnormal   Collection Time: 08/05/16  4:27 AM  Result Value Ref Range   WBC 9.7 4.0 - 10.5 K/uL   RBC 3.46 (L) 4.22 - 5.81 MIL/uL   Hemoglobin 9.8 (L) 13.0 - 17.0 g/dL   HCT 31.0 (L) 39.0 - 52.0 %   MCV 89.6 78.0 - 100.0 fL   MCH 28.3 26.0 - 34.0 pg   MCHC 31.6 30.0 - 36.0 g/dL   RDW 15.2 11.5 - 15.5 %   Platelets 181 150 - 400 K/uL  Comprehensive metabolic panel     Status: Abnormal   Collection Time: 08/05/16  4:27 AM  Result Value Ref Range   Sodium 129 (L) 135 - 145 mmol/L   Potassium 3.1 (L) 3.5 - 5.1 mmol/L   Chloride 93 (L) 101 - 111 mmol/L   CO2 25 22 - 32 mmol/L    Glucose, Bld 134 (H) 65 - 99 mg/dL   BUN 41 (H) 6 - 20 mg/dL   Creatinine, Ser 8.92 (H) 0.61 - 1.24 mg/dL   Calcium 8.3 (L) 8.9 - 10.3 mg/dL   Total Protein 7.6 6.5 - 8.1 g/dL   Albumin 2.3 (L) 3.5 - 5.0 g/dL   AST 15 15 - 41 U/L   ALT 9 (L) 17 - 63 U/L   Alkaline Phosphatase 44 38 - 126 U/L   Total Bilirubin 0.6 0.3 - 1.2 mg/dL   GFR calc non Af Amer 6 (L) >60 mL/min   GFR calc Af Amer 7 (L) >60 mL/min    Comment: (NOTE) The eGFR has been calculated using the CKD EPI equation. This calculation has not been validated in all clinical situations. eGFR's persistently <60 mL/min signify possible Chronic Kidney Disease.    Anion gap 11 5 - 15  Troponin I     Status: Abnormal   Collection Time: 08/05/16  4:27 AM  Result Value Ref Range   Troponin I 0.03 (HH) <0.03 ng/mL    Comment: CRITICAL VALUE NOTED.  VALUE IS CONSISTENT WITH PREVIOUSLY REPORTED AND CALLED VALUE.  Glucose, capillary     Status: Abnormal   Collection Time: 08/05/16  8:05 AM  Result Value Ref Range   Glucose-Capillary 109 (H) 65 - 99 mg/dL  Glucose, capillary     Status: Abnormal   Collection Time: 08/05/16 11:57 AM  Result Value Ref Range   Glucose-Capillary 182 (H) 65 - 99 mg/dL   Ir Removal Tun Cv Cath W/o Fl  Result Date: 08/04/2016 INDICATION: Bacteremia. End-stage renal disease. Request removal of tunneled hemodialysis catheter. Catheter originally placed on 04/04/2015 by Dr. Barbie Banner. EXAM: REMOVAL TUNNELED CENTRAL VENOUS HEMODIALYSIS CATHETER MEDICATIONS: None; ANESTHESIA/SEDATION: Moderate Sedation Time:  None The patient's level of consciousness and vital signs were monitored continuously by radiology nursing throughout the procedure under my direct supervision. FLUOROSCOPY TIME:  Fluoroscopy Time: None COMPLICATIONS: None immediate. PROCEDURE: Informed written consent was obtained from the patient after a  thorough discussion of the procedural risks, benefits and alternatives. All questions were addressed. Maximal  Sterile Barrier Technique was utilized including caps, mask, sterile gowns, sterile gloves, sterile drape, hand hygiene and skin antiseptic. A timeout was performed prior to the initiation of the procedure. The patient's right chest and catheter was prepped and draped in a normal sterile fashion. Heparin was removed from both ports of catheter. 1% lidocaine was used for local anesthesia. Using gentle blunt dissection the cuff of the catheter was exposed and the catheter was removed in it's entirety. Pressure was held till hemostasis was obtained. A sterile dressing was applied. The patient tolerated the procedure well with no immediate complications. IMPRESSION: Successful tunneled hemodialysis catheter removal as described above. Read by: Ascencion Dike PA-C Electronically Signed   By: Sandi Mariscal M.D.   On: 08/04/2016 09:47    Review Of Systems Constitutional: Positive for fatigue, negative for fever or chills. HENT: Negative for sore throat. Respiratory: Negative for astma, COPD or shortness of breath. Cardiology: Negative for chest pain, palpitations. Gastrointestinal: No nausea, vomiting, diarrhea Musculoskeletal: No arthritis or malgia Skin: No rash Neurological: No stroke, seizures or headaches. Psychiatry: No psych admission for suicide.  Blood pressure 109/63, pulse 85, temperature 99.1 F (37.3 C), temperature source Oral, resp. rate 18, height 5' 5"  (1.651 m), weight 94.4 kg (208 lb 1.8 oz), SpO2 98 %. General appearance: alert, cooperative, appears older than stated age and no distress Head: Normocephalic, without obvious abnormality, atraumatic Eyes: Brown eyes, conjunctivae/corneas clear. PERRL, EOM's intact.  Neck: no adenopathy, no carotid bruit, no JVD, supple, trachea midline and thyroid not enlarged.  Resp: clear to auscultation bilaterally Cardio: regular rate and rhythm, S1, S2 normal, no murmur, click, rub or gallop. GI: soft, non-tender; bowel sounds normal; no masses,  no  organomegaly Extremities: extremities normal, atraumatic, no cyanosis or edema Skin: Warm and dry. Neurologic: Alert and oriented X 3, normal strength and tone.   Assessment/Plan MSSA bacteremia End stage renal disease Hypertension Anemia Metabolic bone disease DM, II H/O Diastolic heart failure  TEE tomorrow. Patient understood procedure and risks.  Birdie Riddle, MD  08/05/2016, 4:10 PM

## 2016-08-05 NOTE — Progress Notes (Signed)
[facility] Progress Note  Assessment/Plan: 1. MSSA Bacteremia Nilda Riggs TDC infection: Per primary. WBC 9.7 On  Vanc/Zosyn  Arrangements have been made with IR to remove TDC - out 8/30  Leave out for 48 hour catheter holiday. MSSA detected from [facility] - repeat cultures pending  2.  ESRD -  T,TH,S [place]. HD on 8/29  K+ 3.1. 4.0 K bath for HD tomorrow after cath placed. Usual heparin dose.  3.  Hypertension/volume  - Take hydralazine/Metoprolol for hypertension. BP controlled at present. Standing wt 97.6 kg. Net UF 3L post weight 94.6 kg - UF goal 3-4L  tomorrow  4.  Anemia  - HGB 9.8. No ESA needed now. Follow HGB 5.  Metabolic bone disease - continue VDRA. Hold binders Phos - 1.9 6.  Nutrition - Albumin 2.3 Regular diet/renal vit/nepro 7.  CHF: Per primary. On beta blocker/isosorbide mononitrate  8.  DM: Per primary   Lynnda Child, PA-C [facility] 08/05/2016,11:32 AM  LOS: 2 days   Renal Attending; MSSA bacteremia now without HD cath. ID recs TEE.  For reinsertion of TDC in AM and HD. Jaala Bohle C  Subjective:  Feeling "fine" today. Seen walking through hospital hallway. No c/os today.   Objective Vitals:   08/04/16 1849 08/04/16 2146 08/05/16 0502 08/05/16 0947  BP: (!) 108/55 (!) 114/54 (!) 99/55 109/63  Pulse: 91 92 86 85  Resp: 18 18 16 18   Temp: 99.3 F (37.4 C) 100.2 F (37.9 C) 99.1 F (37.3 C) 99.1 F (37.3 C)  TempSrc: Oral Oral Oral Oral  SpO2: 98% 98% 95% 98%  Weight:  94.4 kg (208 lb 1.8 oz)    Height:       Physical Exam General: WNWD middle aged male NAD Heart: RRR S1 S2 Lungs: CTAB breathing unlabored  Abdomen: soft NT ND Extremities: no LE edema  Dialysis Access: R TDC site bandaged LUA AVF + bruit  Dialysis Orders: [facility], T,TH,S 4 hours 400/Auto 2.0 EDW 91 kg 2.0K/2.25 Ca Heparin 8000 units IV q treatment Mircera 75 mcg IV q 4 weeks (Last dose 07/27/16 Last in-center HGB 11.0, Fe 53  Tsat 25%) Calcitriol 0.5 mcg PO q tx (Last PTH 397 06/24/16)   Additional Objective Labs: Basic Metabolic Panel:  Recent Labs Lab 08/03/16 2024 08/04/16 0359 08/05/16 0427  NA 131* 132* 129*  K 3.3* 3.2* 3.1*  CL 95* 96* 93*  CO2 24 28 25   GLUCOSE 190* 201* 134*  BUN 19 24* 41*  CREATININE 5.39* 6.78* 8.92*  CALCIUM 8.0* 8.0* 8.3*  PHOS 1.9*  --   --    Liver Function Tests:  Recent Labs Lab 08/03/16 2024 08/04/16 0359 08/05/16 0427  AST  --  16 15  ALT  --  9* 9*  ALKPHOS  --  45 44  BILITOT  --  0.5 0.6  PROT  --  7.8 7.6  ALBUMIN 2.9* 2.6* 2.3*   No results for input(s): LIPASE, AMYLASE in the last 168 hours. CBC:  Recent Labs Lab 08/03/16 0710  08/03/16 2024 08/04/16 0359 08/05/16 0427  WBC 15.3*  --  15.4* 15.0* 9.7  NEUTROABS 13.6*  --   --   --   --   HGB 10.6*  < > 11.2* 10.5* 9.8*  HCT 32.3*  < > 34.6* 32.1* 31.0*  MCV 88.0  --  89.4 88.7 89.6  PLT 217  --  189 186 181  < > = values in this interval not displayed.  Blood Culture    Component Value Date/Time   SDES BLOOD RIGHT HAND 08/04/2016 1228   SPECREQUEST BOTTLES DRAWN AEROBIC ONLY 5CC 08/04/2016 1228   CULT NO GROWTH <12 HOURS 08/04/2016 1228   REPTSTATUS PENDING 08/04/2016 1228    Cardiac Enzymes:  Recent Labs Lab 08/03/16 0818 08/05/16 0427  TROPONINI 0.06* 0.03*   CBG:  Recent Labs Lab 08/04/16 0738 08/04/16 1145 08/04/16 1753 08/04/16 2119 08/05/16 0805  GLUCAP 154* 197* 157* 179* 109*   Iron Studies: No results for input(s): IRON, TIBC, TRANSFERRIN, FERRITIN in the last 72 hours. Lab Results  Component Value Date   INR 1.41 08/03/2016   INR 1.43 08/03/2016   INR 1.21 04/02/2015   Studies/Results: Ir Removal Tun Cv Cath W/o Fl  Result Date: 08/04/2016 INDICATION: Bacteremia. End-stage renal disease. Request removal of tunneled hemodialysis catheter. Catheter originally placed on 04/04/2015 by Dr. Barbie Banner. EXAM: REMOVAL TUNNELED CENTRAL VENOUS HEMODIALYSIS  CATHETER MEDICATIONS: None; ANESTHESIA/SEDATION: Moderate Sedation Time:  None The patient's level of consciousness and vital signs were monitored continuously by radiology nursing throughout the procedure under my direct supervision. FLUOROSCOPY TIME:  Fluoroscopy Time: None COMPLICATIONS: None immediate. PROCEDURE: Informed written consent was obtained from the patient after a thorough discussion of the procedural risks, benefits and alternatives. All questions were addressed. Maximal Sterile Barrier Technique was utilized including caps, mask, sterile gowns, sterile gloves, sterile drape, hand hygiene and skin antiseptic. A timeout was performed prior to the initiation of the procedure. The patient's right chest and catheter was prepped and draped in a normal sterile fashion. Heparin was removed from both ports of catheter. 1% lidocaine was used for local anesthesia. Using gentle blunt dissection the cuff of the catheter was exposed and the catheter was removed in it's entirety. Pressure was held till hemostasis was obtained. A sterile dressing was applied. The patient tolerated the procedure well with no immediate complications. IMPRESSION: Successful tunneled hemodialysis catheter removal as described above. Read by: Ascencion Dike PA-C Electronically Signed   By: Sandi Mariscal M.D.   On: 08/04/2016 09:47   Medications:   . calcium acetate  1,334 mg Oral TID WC  .  ceFAZolin (ANCEF) IV  2 g Intravenous Q T,Th,Sa-HD  . Darbepoetin Alfa  100 mcg Intravenous Q Thu-HD  . gabapentin  300 mg Oral QHS  . heparin  5,000 Units Subcutaneous Q8H  . insulin aspart  0-5 Units Subcutaneous QHS  . insulin aspart  0-9 Units Subcutaneous TID WC  . insulin detemir  40 Units Subcutaneous QHS  . polyethylene glycol  17 g Oral Daily  . senna-docusate  1 tablet Oral BID            ;

## 2016-08-05 NOTE — Consult Note (Signed)
Chief Complaint: Patient was seen in consultation today for tunneled dialysis catheter placement Chief Complaint  Patient presents with  . Weakness   at the request of Dr Erling Cruz  Referring Physician(s): Dr Erling Cruz  Supervising Physician: Corrie Mckusick  Patient Status: Inpatient  History of Present Illness: James [name] is a 60 y.o. male   Rt IJ Tunneled HD catheter placed 4/29 in IR Presents to ED 8/29 with obvious infected skin at catheter site Weakness; gait instability Last dialysis 07/31/16 without issue per pt. Blood Cx 8/29 + MSSA BC 8/30 + 1/2 HD catheter removed 8/30 in IR; Cx cath tip pending T: 99.1 today Wbc 9.7 today (15) IV Zosyn and Vanco started 8/29 Now Ancef IV  Request for new tunneled HD catheter placement per Dr Florene Glen after 2 day Holiday  Discussed with Dr Earleen Newport Plan for tunneled catheter placement 9/1  Will check 8/31 Blood Cx results   Past Medical History:  Diagnosis Date  . Asthma    as a child  . CHF (congestive heart failure) ([place])   . Chronic kidney disease    TTHS  . Diabetes mellitus without complication (HCC)    Type II  . ESRD (end stage renal disease) ([place])   . Hypertension     Past Surgical History:  Procedure Laterality Date  . AV FISTULA PLACEMENT Left 04/08/2015   Procedure: LEFT RADIOCEPHALIC ARTERIOVENOUS (AV) FISTULA CREATION;  Surgeon: Elam Dutch, MD;  Location: [place];  Service: Vascular;  Laterality: Left;  . AV FISTULA PLACEMENT Right 09/15/2015   Procedure: RADIOCEPHALIC VERSUS BRACHIOCEPHALIC ARTERIOVENOUS (AV) FISTULA CREATION - RIGHT ARM;  Surgeon: Conrad [place], MD;  Location: [place];  Service: Vascular;  Laterality: Right;  . AV FISTULA PLACEMENT Left 06/07/2016   Procedure: LEFT BRACHIOCEPHALIC ARTERIOVENOUS (AV) FISTULA CREATION;  Surgeon: Elam Dutch, MD;  Location: [place];  Service: Vascular;  Laterality: Left;  . COLONOSCOPY    . IR GENERIC HISTORICAL  08/04/2016   IR REMOVAL TUN CV  CATH W/O FL 08/04/2016 Ascencion Dike, PA-C [facility]-INTERV RAD  . LIGATION OF ARTERIOVENOUS  FISTULA Left 09/15/2015   Procedure: LIGATION OF RADIOCEPHALIC ARTERIOVENOUS FISTULA - LEFT ARM;  Surgeon: Conrad [place], MD;  Location: [place];  Service: Vascular;  Laterality: Left;  . LIGATION OF ARTERIOVENOUS  FISTULA Right 04/16/2016   Procedure: LIGATION OF ARTERIOVENOUS  FISTULA;  Surgeon: Elam Dutch, MD;  Location: [place];  Service: Vascular;  Laterality: Right;  . LIGATION OF COMPETING BRANCHES OF ARTERIOVENOUS FISTULA Left 07/16/2015   Procedure: LIGATION OF SIDE BRANCHES OF ARTERIOVENOUS FISTULA LEFT ARM;  Surgeon: Rosetta Posner, MD;  Location: [place];  Service: Vascular;  Laterality: Left;  . PERIPHERAL VASCULAR CATHETERIZATION N/A 07/04/2015   Procedure: Fistulagram;  Surgeon: Elam Dutch, MD;  Location: [place] [facility];  Service: Cardiovascular;  Laterality: N/A;  . PERIPHERAL VASCULAR CATHETERIZATION N/A 09/08/2015   Procedure: Fistulagram;  Surgeon: Conrad [place], MD;  Location: [place] [facility];  Service: Cardiovascular;  Laterality: N/A;  . PERIPHERAL VASCULAR CATHETERIZATION Right 02/23/2016   Procedure: Fistulagram;  Surgeon: Conrad [place], MD;  Location: [place] [facility];  Service: Cardiovascular;  Laterality: Right;  . PERIPHERAL VASCULAR CATHETERIZATION N/A 04/12/2016   Procedure: Fistulagram;  Surgeon: Conrad , MD;  Location: [place] [facility];  Service: Cardiovascular;  Laterality: N/A;  . REVISON OF ARTERIOVENOUS FISTULA Right 03/10/2016   Procedure: RIGHT CEPHALIC VEIN TURNDOWN;  Surgeon: Jannette Fogo  Bridgett Larsson, MD;  Location: [place];  Service: Vascular;  Laterality: Right;    Allergies: Review of patient's allergies indicates no known allergies.  Medications: Prior to Admission medications   Medication Sig Start Date End Date Taking? Authorizing Provider  calcium acetate (PHOSLO) 667 MG capsule Take 2 capsules (1,334 mg total) by mouth 3 (three) times daily with meals. 04/12/15  Yes  Modena Jansky, MD  Darbepoetin Alfa (ARANESP) 100 MCG/0.5ML SOSY injection Inject 0.5 mLs (100 mcg total) into the vein every Thursday with hemodialysis. Will be given at dialysis. 04/12/15  Yes Modena Jansky, MD  gabapentin (NEURONTIN) 300 MG capsule Take 1 capsule (300 mg total) by mouth at bedtime. 04/12/15  Yes Modena Jansky, MD  insulin aspart (NOVOLOG) 100 UNIT/ML injection Inject 6 Units into the skin 3 (three) times daily before meals. 07/28/16  Yes Wardell Honour, MD  insulin detemir (LEVEMIR) 100 UNIT/ML injection Inject 40 Units into the skin at bedtime.    Yes Historical Provider, MD  isosorbide mononitrate (IMDUR) 60 MG 24 hr tablet Take 1 tablet (60 mg total) by mouth daily. 04/12/15  Yes Modena Jansky, MD  Vitamin D, Ergocalciferol, (DRISDOL) 50000 units CAPS capsule Take 50,000 Units by mouth every 7 (seven) days. Monday   Yes Historical Provider, MD     Family History  Problem Relation Age of Onset  . Hypertension Other     Social History   Social History  . Marital status: Single    Spouse name: N/A  . Number of children: N/A  . Years of education: N/A   Social History Main Topics  . Smoking status: Former Smoker    Years: 1.00    Types: Cigarettes    Quit date: 07/08/2005  . Smokeless tobacco: Never Used  . Alcohol use No  . Drug use: No  . Sexual activity: Not Asked   Other Topics Concern  . None   Social History Narrative  . None     Review of Systems: A 12 point ROS discussed and pertinent positives are indicated in the HPI above.  All other systems are negative.  Review of Systems  Constitutional: Positive for activity change and fever. Negative for appetite change and fatigue.  Neurological: Positive for weakness.  Psychiatric/Behavioral: Negative for behavioral problems and confusion.    Vital Signs: BP 109/63 (BP Location: Right Arm)   Pulse 85   Temp 99.1 F (37.3 C) (Oral)   Resp 18   Ht 5\' 5"  (1.651 m)   Wt 208 lb 1.8 oz (94.4  kg)   SpO2 98%   BMI 34.63 kg/m   Physical Exam  Constitutional: He is oriented to person, place, and time. He appears well-nourished.  Cardiovascular: Normal rate and regular rhythm.   Pulmonary/Chest: Effort normal and breath sounds normal.  Abdominal: Soft. Bowel sounds are normal.  Musculoskeletal: Normal range of motion.  Neurological: He is alert and oriented to person, place, and time.  Skin: Skin is warm and dry.  Psychiatric: He has a normal mood and affect. His behavior is normal. Judgment and thought content normal.  Nursing note and vitals reviewed.   Mallampati Score:  MD Evaluation Airway: WNL Heart: WNL Abdomen: WNL Chest/ Lungs: WNL ASA  Classification: 3 Mallampati/Airway Score: One  Imaging: Dg Chest 2 View  Result Date: 08/03/2016 CLINICAL DATA:  Chest pain, confusion, dialysis patient EXAM: CHEST  2 VIEW COMPARISON:  Chest x-ray of 09/15/2015 FINDINGS: No active infiltrate or effusion is seen. Mild  linear atelectasis is noted at the lung bases. The heart is mildly enlarged and stable. Central venous catheter is unchanged in position. No bony abnormality is seen. IMPRESSION: Mild bibasilar linear atelectasis.  Stable mild cardiomegaly. Electronically Signed   By: Ivar Drape M.D.   On: 08/03/2016 10:57   Ir Removal Tun Cv Cath W/o Fl  Result Date: 08/04/2016 INDICATION: Bacteremia. End-stage renal disease. Request removal of tunneled hemodialysis catheter. Catheter originally placed on 04/04/2015 by Dr. Barbie Banner. EXAM: REMOVAL TUNNELED CENTRAL VENOUS HEMODIALYSIS CATHETER MEDICATIONS: None; ANESTHESIA/SEDATION: Moderate Sedation Time:  None The patient's level of consciousness and vital signs were monitored continuously by radiology nursing throughout the procedure under my direct supervision. FLUOROSCOPY TIME:  Fluoroscopy Time: None COMPLICATIONS: None immediate. PROCEDURE: Informed written consent was obtained from the patient after a thorough discussion of the  procedural risks, benefits and alternatives. All questions were addressed. Maximal Sterile Barrier Technique was utilized including caps, mask, sterile gowns, sterile gloves, sterile drape, hand hygiene and skin antiseptic. A timeout was performed prior to the initiation of the procedure. The patient's right chest and catheter was prepped and draped in a normal sterile fashion. Heparin was removed from both ports of catheter. 1% lidocaine was used for local anesthesia. Using gentle blunt dissection the cuff of the catheter was exposed and the catheter was removed in it's entirety. Pressure was held till hemostasis was obtained. A sterile dressing was applied. The patient tolerated the procedure well with no immediate complications. IMPRESSION: Successful tunneled hemodialysis catheter removal as described above. Read by: Ascencion Dike PA-C Electronically Signed   By: Sandi Mariscal M.D.   On: 08/04/2016 09:47    Labs:  CBC:  Recent Labs  08/03/16 0710 08/03/16 0731 08/03/16 2024 08/04/16 0359 08/05/16 0427  WBC 15.3*  --  15.4* 15.0* 9.7  HGB 10.6* 11.9* 11.2* 10.5* 9.8*  HCT 32.3* 35.0* 34.6* 32.1* 31.0*  PLT 217  --  189 186 181    COAGS:  Recent Labs  08/03/16 0818 08/03/16 2024  INR 1.43 1.41  APTT  --  29    BMP:  Recent Labs  08/03/16 0710 08/03/16 0731 08/03/16 2024 08/04/16 0359 08/05/16 0427  NA 124* 128* 131* 132* 129*  K 4.1 4.1 3.3* 3.2* 3.1*  CL 92* 93* 95* 96* 93*  CO2 17*  --  24 28 25   GLUCOSE 375* 378* 190* 201* 134*  BUN 53* 51* 19 24* 41*  CALCIUM 8.2*  --  8.0* 8.0* 8.3*  CREATININE 11.12* 11.80* 5.39* 6.78* 8.92*  GFRNONAA 4*  --  11* 8* 6*  GFRAA 5*  --  12* 9* 7*    LIVER FUNCTION TESTS:  Recent Labs  11/10/15 1043 08/03/16 2024 08/04/16 0359 08/05/16 0427  BILITOT 1.0  --  0.5 0.6  AST 14*  --  16 15  ALT 11*  --  9* 9*  ALKPHOS 72  --  45 44  PROT 10.0*  --  7.8 7.6  ALBUMIN 3.8 2.9* 2.6* 2.3*    TUMOR MARKERS: No results for  input(s): AFPTM, CEA, CA199, CHROMGRNA in the last 8760 hours.  Assessment and Plan:  HD cath placed 04/03/16 Infected catheter with +BC 8/29 and 8/30 Infected catheter removed 08/04/16 am 2 day Holiday Now scheduled for tunneled HD cathter placement 9/1 Risks and Benefits discussed with the patient including, but not limited to bleeding, infection, vascular injury, pneumothorax which may require chest tube placement, air embolism or even death All of the patient's questions  were answered, patient is agreeable to proceed. Consent signed and in chart.  Thank you for this interesting consult.  I greatly enjoyed meeting Gurney Honorato and look forward to participating in their care.  A copy of this report was sent to the requesting provider on this date.  Electronically Signed: Tallia Moehring A 08/05/2016, 1:35 PM   I spent a total of 40 Minutes    in face to face in clinical consultation, greater than 50% of which was counseling/coordinating care for tunneled HD catheter placement

## 2016-08-05 NOTE — Progress Notes (Signed)
[facility] TEAM 1 - Stepdown/ICU TEAM  James [name]  BJY:[id number] DOB: 1956/07/25 DOA: 08/03/2016 PCP: Frederica Kuster, MD    Brief Narrative:  60 y.o. ? 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   In the ED he was found to have a temp of 103, HR 113, and RR 24 w/ WBC 15.3.  His RIJ TDC had been present since 04/04/2015.   This was removed on 08/04/2016 Blood cultures grew MSSA bacteremia and antibiotics were narrowed to cefazolin Repeat cultures performed 8/30 are pending  Subjective: The patient states he feels much better overall.  He denies chest pain shortness of breath fevers chills nausea or vomiting.  He is anxious to get up and began moving around.  Assessment & Plan:  Sepsis due to Staph bacteremia (MSSA) - suspected HD cath infection  Antibiotics per ID  - no evidence on TTE of endocarditis  - TEE required per ID--Dr Algie Coffer consulted for the same - follow rpt BC done 08/05/16   White count 15--->9.7 currently  ESRD on T/Th/Sat HD Nephrology following dialysis access currently out -Will probably need line holiday and guidance from infectious disease regarding placement of another catheter   Elevated troponin Suspect this is simply stress-related in setting of ESRD - recheck in a.m. - no chest pain  Chronic systolic congestive heart failure EF on TTE this admission actually normal, which is an improvement since his last echo in 2016  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, nephropathy  CBG improving - continue to follow  Anemia of chronic kidney disease No evidence of acute blood loss - follow  Hypertension Blood pressure currently well controlled  DVT prophylaxis: Subcutaneous heparin Code Status: FULL CODE Family Communication: no family present at time of exam  Disposition Plan: Stable for transfer to renal  bed  Consultants:  Nephrology ID  Procedures: 8/30 TDC removal per IR  8/30 TTE - EF is 60-65% - wall motion normal - grade 1 diastolic dysfunction  Antimicrobials:  Zosyn 8/29  Vancomycin 8/29  Ceftaz 8/29 >  Objective: Blood pressure (!) 99/55, pulse 86, temperature 99.1 F (37.3 C), temperature source Oral, resp. rate 16, height 5\' 5"  (1.651 m), weight 94.4 kg (208 lb 1.8 oz), SpO2 95 %.  Intake/Output Summary (Last 24 hours) at 08/05/16 0804 Last data filed at 08/05/16 0865  Gross per 24 hour  Intake              360 ml  Output                0 ml  Net              360 ml   Filed Weights   08/03/16 1918 08/04/16 0313 08/04/16 2146  Weight: 94.8 kg (208 lb 15.9 oz) 95.1 kg (209 lb 10.5 oz) 94.4 kg (208 lb 1.8 oz)    Examination: General: No acute respiratory distress, aleert pleasant watching "cops" on tv Lungs: Clear to auscultation bilaterally  Cardiovascular: s1 s2 no m/r/g Abdomen: Nontender, nondistended, soft, bowel sounds positive, no rebound, no ascites, no appreciable mass Extremities: No significant cyanosis, clubbing, or edema bilateral lower extremities  CBC:  Recent Labs Lab 08/03/16 0710 08/03/16 0731 08/03/16 2024 08/04/16 0359 08/05/16 0427  WBC 15.3*  --  15.4* 15.0* 9.7  NEUTROABS 13.6*  --   --   --   --  HGB 10.6* 11.9* 11.2* 10.5* 9.8*  HCT 32.3* 35.0* 34.6* 32.1* 31.0*  MCV 88.0  --  89.4 88.7 89.6  PLT 217  --  189 186 181   Basic Metabolic Panel:  Recent Labs Lab 08/03/16 0710 08/03/16 0731 08/03/16 2024 08/04/16 0359 08/05/16 0427  NA 124* 128* 131* 132* 129*  K 4.1 4.1 3.3* 3.2* 3.1*  CL 92* 93* 95* 96* 93*  CO2 17*  --  24 28 25   GLUCOSE 375* 378* 190* 201* 134*  BUN 53* 51* 19 24* 41*  CREATININE 11.12* 11.80* 5.39* 6.78* 8.92*  CALCIUM 8.2*  --  8.0* 8.0* 8.3*  PHOS  --   --  1.9*  --   --    GFR: Estimated Creatinine Clearance: 9.5 mL/min (by C-G formula based on SCr of 8.92 mg/dL).  Liver Function  Tests:  Recent Labs Lab 08/03/16 2024 08/04/16 0359 08/05/16 0427  AST  --  16 15  ALT  --  9* 9*  ALKPHOS  --  45 44  BILITOT  --  0.5 0.6  PROT  --  7.8 7.6  ALBUMIN 2.9* 2.6* 2.3*   Coagulation Profile:  Recent Labs Lab 08/03/16 0818 08/03/16 2024  INR 1.43 1.41    Cardiac Enzymes:  Recent Labs Lab 08/03/16 0818 08/05/16 0427  TROPONINI 0.06* 0.03*    HbA1C: Hgb A1c MFr Bld  Date/Time Value Ref Range Status  03/22/2015 10:52 PM 7.2 (H) 4.8 - 5.6 % Final    Comment:    (NOTE)         Pre-diabetes: 5.7 - 6.4         Diabetes: >6.4         Glycemic control for adults with diabetes: <7.0     CBG:  Recent Labs Lab 08/03/16 2107 08/04/16 0738 08/04/16 1145 08/04/16 1753 08/04/16 2119  GLUCAP 223* 154* 197* 157* 179*    Recent Results (from the past 240 hour(s))  Blood Culture (routine x 2)     Status: Abnormal (Preliminary result)   Collection Time: 08/03/16  8:15 AM  Result Value Ref Range Status   Specimen Description BLOOD RIGHT ANTECUBITAL  Final   Special Requests BOTTLES DRAWN AEROBIC AND ANAEROBIC  10CC  Final   Culture  Setup Time   Final    GRAM POSITIVE COCCI IN CLUSTERS IN BOTH AEROBIC AND ANAEROBIC BOTTLES CRITICAL RESULT CALLED TO, READ BACK BY AND VERIFIED WITH: K. HIATT, PHARM AT 2049 ON [id number] BY Lucienne Capers    Culture STAPHYLOCOCCUS AUREUS (A)  Final   Report Status PENDING  Incomplete  Blood Culture ID Panel (Reflexed)     Status: Abnormal   Collection Time: 08/03/16  8:15 AM  Result Value Ref Range Status   Enterococcus species NOT DETECTED NOT DETECTED Final   Listeria monocytogenes NOT DETECTED NOT DETECTED Final   Staphylococcus species DETECTED (A) NOT DETECTED Final    Comment: CRITICAL RESULT CALLED TO, READ BACK BY AND VERIFIED WITH: K. HIATT, PHARM AT 2049 ON [id number] BY S. YARBROUGH    Staphylococcus aureus DETECTED (A) NOT DETECTED Final    Comment: CRITICAL RESULT CALLED TO, READ BACK BY AND VERIFIED WITH: K.  HIATT, PHARM AT 2049 ON [id number] BY S. YARBROUGH    Methicillin resistance NOT DETECTED NOT DETECTED Final   Streptococcus species NOT DETECTED NOT DETECTED Final   Streptococcus agalactiae NOT DETECTED NOT DETECTED Final   Streptococcus pneumoniae NOT DETECTED NOT DETECTED Final   Streptococcus pyogenes NOT DETECTED  NOT DETECTED Final   Acinetobacter baumannii NOT DETECTED NOT DETECTED Final   Enterobacteriaceae species NOT DETECTED NOT DETECTED Final   Enterobacter cloacae complex NOT DETECTED NOT DETECTED Final   Escherichia coli NOT DETECTED NOT DETECTED Final   Klebsiella oxytoca NOT DETECTED NOT DETECTED Final   Klebsiella pneumoniae NOT DETECTED NOT DETECTED Final   Proteus species NOT DETECTED NOT DETECTED Final   Serratia marcescens NOT DETECTED NOT DETECTED Final   Haemophilus influenzae NOT DETECTED NOT DETECTED Final   Neisseria meningitidis NOT DETECTED NOT DETECTED Final   Pseudomonas aeruginosa NOT DETECTED NOT DETECTED Final   Candida albicans NOT DETECTED NOT DETECTED Final   Candida glabrata NOT DETECTED NOT DETECTED Final   Candida krusei NOT DETECTED NOT DETECTED Final   Candida parapsilosis NOT DETECTED NOT DETECTED Final   Candida tropicalis NOT DETECTED NOT DETECTED Final  Blood Culture (routine x 2)     Status: Abnormal (Preliminary result)   Collection Time: 08/03/16  8:20 AM  Result Value Ref Range Status   Specimen Description BLOOD RIGHT HAND  Final   Special Requests BOTTLES DRAWN AEROBIC ONLY  10CC  Final   Culture  Setup Time   Final    GRAM POSITIVE COCCI IN CLUSTERS AEROBIC BOTTLE ONLY CRITICAL RESULT CALLED TO, READ BACK BY AND VERIFIED WITH: K. HIATT, PHARM AT 2049 ON [id number] BY Lucienne Capers    Culture STAPHYLOCOCCUS AUREUS (A)  Final   Report Status PENDING  Incomplete  MRSA PCR Screening     Status: None   Collection Time: 08/03/16  8:54 PM  Result Value Ref Range Status   MRSA by PCR NEGATIVE NEGATIVE Final    Comment:        The GeneXpert  MRSA Assay (FDA approved for NASAL specimens only), is one component of a comprehensive MRSA colonization surveillance program. It is not intended to diagnose MRSA infection nor to guide or monitor treatment for MRSA infections.   Cath Tip Culture     Status: None (Preliminary result)   Collection Time: 08/04/16  9:23 AM  Result Value Ref Range Status   Specimen Description CATH TIP  Final   Special Requests NONE  Final   Culture CULTURE REINCUBATED FOR BETTER GROWTH  Final   Report Status PENDING  Incomplete  Culture, blood (routine x 2)     Status: None (Preliminary result)   Collection Time: 08/04/16 12:25 PM  Result Value Ref Range Status   Specimen Description BLOOD RIGHT HAND  Final   Special Requests IN PEDIATRIC BOTTLE 1.0CC  Final   Culture  Setup Time   Final    GRAM POSITIVE COCCI IN CLUSTERS IN PEDIATRIC BOTTLE CRITICAL VALUE NOTED.  VALUE IS CONSISTENT WITH PREVIOUSLY REPORTED AND CALLED VALUE.    Culture GRAM POSITIVE COCCI  Final   Report Status PENDING  Incomplete  Culture, blood (routine x 2)     Status: None (Preliminary result)   Collection Time: 08/04/16 12:28 PM  Result Value Ref Range Status   Specimen Description BLOOD RIGHT HAND  Final   Special Requests BOTTLES DRAWN AEROBIC ONLY 5CC  Final   Culture NO GROWTH <12 HOURS  Final   Report Status PENDING  Incomplete     Scheduled Meds: . calcium acetate  1,334 mg Oral TID WC  .  ceFAZolin (ANCEF) IV  2 g Intravenous Q T,Th,Sa-HD  . Darbepoetin Alfa  100 mcg Intravenous Q Thu-HD  . gabapentin  300 mg Oral QHS  . heparin  5,000 Units Subcutaneous Q8H  . insulin aspart  0-5 Units Subcutaneous QHS  . insulin aspart  0-9 Units Subcutaneous TID WC  . insulin detemir  40 Units Subcutaneous QHS  . polyethylene glycol  17 g Oral Daily  . senna-docusate  1 tablet Oral BID   Continuous Infusions:     LOS: 2 days   35 min  Pleas Koch, MD [facility]   If 7PM-7AM, please contact  night-coverage [email or web address] Password [email or web address] 08/05/2016, 8:04 AM

## 2016-08-05 NOTE — Progress Notes (Signed)
Late entry on 8/31.  Pt seen on 8/30.  Assessment/Plan: 1.  Sepsis/Possible TDC infection cath removed this AM by IR.  To be replaced after catheter holidays 2.  ESRD -  T,TH,S [place]. S/p last HD on 8/29. No usable access now   Subjective: Interval History: Feels much better  Objective: Vital signs in last 24 hours: Temp:  [99.1 F (37.3 [name])-100.2 F (37.9 [name])] 99.1 F (37.3 [name]) (08/31 0947) Pulse Rate:  [85-95] 85 (08/31 0947) Resp:  [14-18] 18 (08/31 0947) BP: (99-136)/(54-77) 109/63 (08/31 0947) SpO2:  [95 %-98 %] 98 % (08/31 0947) Weight:  [94.4 kg (208 lb 1.8 oz)] 94.4 kg (208 lb 1.8 oz) (08/30 2146) Weight change: -3.2 kg (-7 lb 0.9 oz)  Intake/Output from previous day: 08/30 0701 - 08/31 0700 In: 360 [P.O.:360] Out: 0  Intake/Output this shift: Total I/O In: 240 [P.O.:240] Out: -   General appearance: alert and cooperative Chest wall: no tenderness, post op chest ok Cardio: regular rate and rhythm, S1, S2 normal, no murmur, click, rub or gallop Extremities: extremities normal, atraumatic, no cyanosis or edema and LUE AVF small  Lab Results:  Recent Labs  08/04/16 0359 08/05/16 0427  WBC 15.0* 9.7  HGB 10.5* 9.8*  HCT 32.1* 31.0*  PLT 186 181   BMET:  Recent Labs  08/04/16 0359 08/05/16 0427  NA 132* 129*  K 3.2* 3.1*  CL 96* 93*  CO2 28 25  GLUCOSE 201* 134*  BUN 24* 41*  CREATININE 6.78* 8.92*  CALCIUM 8.0* 8.3*   No results for input(s): PTH in the last 72 hours. Iron Studies: No results for input(s): IRON, TIBC, TRANSFERRIN, FERRITIN in the last 72 hours. Studies/Results: Dg Chest 2 View  Result Date: 08/03/2016 CLINICAL DATA:  Chest pain, confusion, dialysis patient EXAM: CHEST  2 VIEW COMPARISON:  Chest x-ray of 09/15/2015 FINDINGS: No active infiltrate or effusion is seen. Mild linear atelectasis is noted at the lung bases. The heart is mildly enlarged and stable. Central venous catheter is unchanged in position. No bony abnormality is  seen. IMPRESSION: Mild bibasilar linear atelectasis.  Stable mild cardiomegaly. Electronically Signed   By: Ivar Drape M.D.   On: 08/03/2016 10:57   Ir Removal Tun Cv Cath W/o Fl  Result Date: 08/04/2016 INDICATION: Bacteremia. End-stage renal disease. Request removal of tunneled hemodialysis catheter. Catheter originally placed on 04/04/2015 by Dr. Barbie Banner. EXAM: REMOVAL TUNNELED CENTRAL VENOUS HEMODIALYSIS CATHETER MEDICATIONS: None; ANESTHESIA/SEDATION: Moderate Sedation Time:  None The patient's level of consciousness and vital signs were monitored continuously by radiology nursing throughout the procedure under my direct supervision. FLUOROSCOPY TIME:  Fluoroscopy Time: None COMPLICATIONS: None immediate. PROCEDURE: Informed written consent was obtained from the patient after a thorough discussion of the procedural risks, benefits and alternatives. All questions were addressed. Maximal Sterile Barrier Technique was utilized including caps, mask, sterile gowns, sterile gloves, sterile drape, hand hygiene and skin antiseptic. A timeout was performed prior to the initiation of the procedure. The patient's right chest and catheter was prepped and draped in a normal sterile fashion. Heparin was removed from both ports of catheter. 1% lidocaine was used for local anesthesia. Using gentle blunt dissection the cuff of the catheter was exposed and the catheter was removed in it's entirety. Pressure was held till hemostasis was obtained. A sterile dressing was applied. The patient tolerated the procedure well with no immediate complications. IMPRESSION: Successful tunneled hemodialysis catheter removal as described above. Read by: Ascencion Dike PA-[name] Electronically Signed  By: Sandi Mariscal M.D.   On: 08/04/2016 09:47    Scheduled: . calcium acetate  1,334 mg Oral TID WC  .  ceFAZolin (ANCEF) IV  2 g Intravenous Q T,Th,Sa-HD  . Darbepoetin Alfa  100 mcg Intravenous Q Thu-HD  . gabapentin  300 mg Oral QHS  .  heparin  5,000 Units Subcutaneous Q8H  . insulin aspart  0-5 Units Subcutaneous QHS  . insulin aspart  0-9 Units Subcutaneous TID WC  . insulin detemir  40 Units Subcutaneous QHS  . polyethylene glycol  17 g Oral Daily  . senna-docusate  1 tablet Oral BID     James [name] 08/05/2016,10:31 AM

## 2016-08-05 NOTE — Progress Notes (Signed)
Patient ID: James [name], male   DOB: 1956/09/25, 60 y.o.   MRN: [id number]          [facility]    Date of Admission:  08/03/2016   Total days of antibiotics 3        Day 2 cefazolin         Principal Problem:   Staphylococcus aureus bacteremia Active Problems:   Severe sepsis (HCC)   HTN (hypertension)   DM (diabetes mellitus) (HCC)   ESRD on dialysis (HCC)   Anemia   Multiple myeloma (HCC)   Anemia associated with chronic renal failure   Chronic systolic heart failure (HCC)   Hyponatremia   Elevated troponin   . calcium acetate  1,334 mg Oral TID WC  .  ceFAZolin (ANCEF) IV  2 g Intravenous Q T,Th,Sa-HD  . Darbepoetin Alfa  100 mcg Intravenous Q Thu-HD  . gabapentin  300 mg Oral QHS  . heparin  5,000 Units Subcutaneous Q8H  . insulin aspart  0-5 Units Subcutaneous QHS  . insulin aspart  0-9 Units Subcutaneous TID WC  . insulin detemir  40 Units Subcutaneous QHS  . polyethylene glycol  17 g Oral Daily  . senna-docusate  1 tablet Oral BID    SUBJECTIVE: He is feeling much better.  Review of Systems: Review of Systems  Constitutional: Negative for chills, diaphoresis, fever, malaise/fatigue and weight loss.  HENT: Negative for sore throat.   Respiratory: Negative for cough, sputum production and shortness of breath.   Cardiovascular: Negative for chest pain.  Gastrointestinal: Negative for abdominal pain, diarrhea, nausea and vomiting.  Musculoskeletal: Negative for joint pain and myalgias.  Skin: Negative for rash.  Neurological: Negative for headaches.    Past Medical History:  Diagnosis Date  . Asthma    as a child  . CHF (congestive heart failure) (HCC)   . Chronic kidney disease    TTHS  . Diabetes mellitus without complication (HCC)    Type II  . ESRD (end stage renal disease) (HCC)   . Hypertension     Social History  Substance Use Topics  . Smoking status: Former Smoker    Years: 1.00    Types: Cigarettes   Quit date: 07/08/2005  . Smokeless tobacco: Never Used  . Alcohol use No    Family History  Problem Relation Age of Onset  . Hypertension Other    No Known Allergies  OBJECTIVE: Vitals:   08/04/16 1849 08/04/16 2146 08/05/16 0502 08/05/16 0947  BP: (!) 108/55 (!) 114/54 (!) 99/55 109/63  Pulse: 91 92 86 85  Resp: 18 18 16 18   Temp: 99.3 F (37.4 C) 100.2 F (37.9 C) 99.1 F (37.3 C) 99.1 F (37.3 C)  TempSrc: Oral Oral Oral Oral  SpO2: 98% 98% 95% 98%  Weight:  208 lb 1.8 oz (94.4 kg)    Height:       Body mass index is 34.63 kg/m.  Physical Exam  Constitutional: He is oriented to person, place, and time.  He is alert and in good spirits.  Cardiovascular: Normal rate and regular rhythm.   No murmur heard. Distant heart sounds.  Pulmonary/Chest: Effort normal and breath sounds normal.  Musculoskeletal: Normal range of motion. He exhibits no edema or tenderness.  Neurological: He is alert and oriented to person, place, and time.  Skin: No rash noted.  Psychiatric: Mood and affect normal.    Lab Results Lab Results  Component Value Date   WBC  9.7 08/05/2016   HGB 9.8 (L) 08/05/2016   HCT 31.0 (L) 08/05/2016   MCV 89.6 08/05/2016   PLT 181 08/05/2016    Lab Results  Component Value Date   CREATININE 8.92 (H) 08/05/2016   BUN 41 (H) 08/05/2016   NA 129 (L) 08/05/2016   K 3.1 (L) 08/05/2016   CL 93 (L) 08/05/2016   CO2 25 08/05/2016    Lab Results  Component Value Date   ALT 9 (L) 08/05/2016   AST 15 08/05/2016   ALKPHOS 44 08/05/2016   BILITOT 0.6 08/05/2016     Microbiology: Recent Results (from the past 240 hour(s))  Blood Culture (routine x 2)     Status: Abnormal   Collection Time: 08/03/16  8:15 AM  Result Value Ref Range Status   Specimen Description BLOOD RIGHT ANTECUBITAL  Final   Special Requests BOTTLES DRAWN AEROBIC AND ANAEROBIC  10CC  Final   Culture  Setup Time   Final    GRAM POSITIVE COCCI IN CLUSTERS IN BOTH AEROBIC AND ANAEROBIC  BOTTLES CRITICAL RESULT CALLED TO, READ BACK BY AND VERIFIED WITH: K. HIATT, PHARM AT 2049 ON [id number] BY Lucienne Capers    Culture STAPHYLOCOCCUS AUREUS (A)  Final   Report Status 08/05/2016 FINAL  Final   Organism ID, Bacteria STAPHYLOCOCCUS AUREUS  Final      Susceptibility   Staphylococcus aureus - MIC*    CIPROFLOXACIN <=0.5 SENSITIVE Sensitive     ERYTHROMYCIN <=0.25 SENSITIVE Sensitive     GENTAMICIN <=0.5 SENSITIVE Sensitive     OXACILLIN 0.5 SENSITIVE Sensitive     TETRACYCLINE <=1 SENSITIVE Sensitive     VANCOMYCIN <=0.5 SENSITIVE Sensitive     TRIMETH/SULFA <=10 SENSITIVE Sensitive     CLINDAMYCIN <=0.25 SENSITIVE Sensitive     RIFAMPIN <=0.5 SENSITIVE Sensitive     Inducible Clindamycin NEGATIVE Sensitive     * STAPHYLOCOCCUS AUREUS  Blood Culture ID Panel (Reflexed)     Status: Abnormal   Collection Time: 08/03/16  8:15 AM  Result Value Ref Range Status   Enterococcus species NOT DETECTED NOT DETECTED Final   Listeria monocytogenes NOT DETECTED NOT DETECTED Final   Staphylococcus species DETECTED (A) NOT DETECTED Final    Comment: CRITICAL RESULT CALLED TO, READ BACK BY AND VERIFIED WITH: K. HIATT, PHARM AT 2049 ON [id number] BY S. YARBROUGH    Staphylococcus aureus DETECTED (A) NOT DETECTED Final    Comment: CRITICAL RESULT CALLED TO, READ BACK BY AND VERIFIED WITH: K. HIATT, PHARM AT 2049 ON [id number] BY S. YARBROUGH    Methicillin resistance NOT DETECTED NOT DETECTED Final   Streptococcus species NOT DETECTED NOT DETECTED Final   Streptococcus agalactiae NOT DETECTED NOT DETECTED Final   Streptococcus pneumoniae NOT DETECTED NOT DETECTED Final   Streptococcus pyogenes NOT DETECTED NOT DETECTED Final   Acinetobacter baumannii NOT DETECTED NOT DETECTED Final   Enterobacteriaceae species NOT DETECTED NOT DETECTED Final   Enterobacter cloacae complex NOT DETECTED NOT DETECTED Final   Escherichia coli NOT DETECTED NOT DETECTED Final   Klebsiella oxytoca NOT DETECTED NOT  DETECTED Final   Klebsiella pneumoniae NOT DETECTED NOT DETECTED Final   Proteus species NOT DETECTED NOT DETECTED Final   Serratia marcescens NOT DETECTED NOT DETECTED Final   Haemophilus influenzae NOT DETECTED NOT DETECTED Final   Neisseria meningitidis NOT DETECTED NOT DETECTED Final   Pseudomonas aeruginosa NOT DETECTED NOT DETECTED Final   Candida albicans NOT DETECTED NOT DETECTED Final   Candida glabrata NOT DETECTED NOT  DETECTED Final   Candida krusei NOT DETECTED NOT DETECTED Final   Candida parapsilosis NOT DETECTED NOT DETECTED Final   Candida tropicalis NOT DETECTED NOT DETECTED Final  Blood Culture (routine x 2)     Status: Abnormal   Collection Time: 08/03/16  8:20 AM  Result Value Ref Range Status   Specimen Description BLOOD RIGHT HAND  Final   Special Requests BOTTLES DRAWN AEROBIC ONLY  10CC  Final   Culture  Setup Time   Final    GRAM POSITIVE COCCI IN CLUSTERS AEROBIC BOTTLE ONLY CRITICAL RESULT CALLED TO, READ BACK BY AND VERIFIED WITH: K. HIATT, PHARM AT 2049 ON [id number] BY Lucienne Capers    Culture (A)  Final    STAPHYLOCOCCUS AUREUS SUSCEPTIBILITIES PERFORMED ON PREVIOUS CULTURE WITHIN THE LAST 5 DAYS.    Report Status 08/05/2016 FINAL  Final  MRSA PCR Screening     Status: None   Collection Time: 08/03/16  8:54 PM  Result Value Ref Range Status   MRSA by PCR NEGATIVE NEGATIVE Final    Comment:        The GeneXpert MRSA Assay (FDA approved for NASAL specimens only), is one component of a comprehensive MRSA colonization surveillance program. It is not intended to diagnose MRSA infection nor to guide or monitor treatment for MRSA infections.   Cath Tip Culture     Status: None (Preliminary result)   Collection Time: 08/04/16  9:23 AM  Result Value Ref Range Status   Specimen Description CATH TIP  Final   Special Requests NONE  Final   Culture CULTURE REINCUBATED FOR BETTER GROWTH  Final   Report Status PENDING  Incomplete  Culture, blood (routine x  2)     Status: None (Preliminary result)   Collection Time: 08/04/16 12:25 PM  Result Value Ref Range Status   Specimen Description BLOOD RIGHT HAND  Final   Special Requests IN PEDIATRIC BOTTLE 1.0CC  Final   Culture  Setup Time   Final    GRAM POSITIVE COCCI IN CLUSTERS IN PEDIATRIC BOTTLE CRITICAL VALUE NOTED.  VALUE IS CONSISTENT WITH PREVIOUSLY REPORTED AND CALLED VALUE.    Culture GRAM POSITIVE COCCI  Final   Report Status PENDING  Incomplete  Culture, blood (routine x 2)     Status: None (Preliminary result)   Collection Time: 08/04/16 12:28 PM  Result Value Ref Range Status   Specimen Description BLOOD RIGHT HAND  Final   Special Requests BOTTLES DRAWN AEROBIC ONLY 5CC  Final   Culture NO GROWTH <12 HOURS  Final   Report Status PENDING  Incomplete     ASSESSMENT: He is improving on therapy for MSSA bacteremia. It was probably related to his hemodialysis catheter that has been removed. He has no evidence of endocarditis by TTE but I would recommend TEE since repeat blood cultures are still positive. I have ordered another set of blood cultures today.  PLAN: 1. Continue cefazolin 2. Repeat blood cultures 3. Recommend TEE  Cliffton Asters, MD [facility] [phone] pager   [phone] cell 08/05/2016, 10:43 AM

## 2016-08-05 NOTE — Progress Notes (Signed)
Pharmacy Antibiotic Note  James [name] is a 60 y.o. male admitted on 08/03/2016 with sepsis.  Pharmacy consulted 08/03/16 for vancomycin and Zosyn dosing. ESRD on HD TTSat.Deniece Ree and zosyn doses given 08/03/16.  BCID performed 8/29--->Staph aureus, (meth resistance NOT detected).  Changed therapy to Ancef per pharmacy consult, received 1st dose Ancef on 8/29.  Today RN reports pt is not going to HD today due to pt needs new HD cath which possibly will do this tomorrow, so no HD today. Dr. Florene Glen wants RN to give ancef dose today.  Tc 99.1, TM 100.2  WBC down to 9.7k.  Cath tip: GPC clusters Repeat Blood cx's sent today.   Plan: -Ancef 2gm IV x1 now  (will not need to redose preop to radiology procedure tomorrow until either 24h post today's dose or after HD) Ancef 2gm IV qHD qTTS order in to start Sat 9/2, but I wil  f/u Fri, 9/1 in case goes to HD Friday.   Height: 5\' 5"  (165.1 cm) Weight: 208 lb 1.8 oz (94.4 kg) IBW/kg (Calculated) : 61.5  Temp (24hrs), Avg:99.4 F (37.4 C), Min:99.1 F (37.3 C), Max:100.2 F (37.9 C)   Recent Labs Lab 08/03/16 0710 08/03/16 0731 08/03/16 0835 08/03/16 1157 08/03/16 1159 08/03/16 2024 08/04/16 0359 08/05/16 0427  WBC 15.3*  --   --   --   --  15.4* 15.0* 9.7  CREATININE 11.12* 11.80*  --   --   --  5.39* 6.78* 8.92*  LATICACIDVEN  --   --  1.65 1.06 1.2  --   --   --     Estimated Creatinine Clearance: 9.5 mL/min (by C-G formula based on SCr of 8.92 mg/dL).    No Known Allergies  Antimicrobials this admission: Vanc 8/29 >> 8/29 (received 1 dose 8/29 @ 09:20) Zosyn 8/29 >> 8/29 Ancef 8/29>  Dose adjustments this admission: n/a  Microbiology results: 8/29BCx: MSSA 8/29 Cath tip: GPC clusters 8/29 MRSA PCR: neg 8/31 BCx: sent  Thank you for allowing pharmacy to be a part of this patient's care Nicole Cella, RPh Clinical Pharmacist Pager: [phone] 08/05/2016 2:00 PM

## 2016-08-06 ENCOUNTER — Encounter (HOSPITAL_COMMUNITY): Payer: Self-pay | Admitting: Interventional Radiology

## 2016-08-06 ENCOUNTER — Inpatient Hospital Stay (HOSPITAL_COMMUNITY): Payer: Medicare Other

## 2016-08-06 ENCOUNTER — Encounter (HOSPITAL_COMMUNITY)
Admission: EM | Disposition: A | Discharge status: Home / Self Care | Payer: Self-pay | Source: Home / Self Care | Attending: Family Medicine

## 2016-08-06 DIAGNOSIS — Y712 Prosthetic and other implants, materials and accessory cardiovascular devices associated with adverse incidents: Secondary | ICD-10-CM

## 2016-08-06 DIAGNOSIS — R6520 Severe sepsis without septic shock: Secondary | ICD-10-CM

## 2016-08-06 DIAGNOSIS — T80219D Unspecified infection due to central venous catheter, subsequent encounter: Secondary | ICD-10-CM

## 2016-08-06 DIAGNOSIS — A419 Sepsis, unspecified organism: Secondary | ICD-10-CM

## 2016-08-06 HISTORY — PX: TEE WITHOUT CARDIOVERSION: SHX5443

## 2016-08-06 HISTORY — PX: IR GENERIC HISTORICAL: IMG1180011

## 2016-08-06 LAB — RENAL FUNCTION PANEL
ALBUMIN: 2.5 g/dL — AB (ref 3.5–5.0)
Anion gap: 10 (ref 5–15)
BUN: 61 mg/dL — ABNORMAL HIGH (ref 6–20)
CHLORIDE: 94 mmol/L — AB (ref 101–111)
CO2: 24 mmol/L (ref 22–32)
Calcium: 8.3 mg/dL — ABNORMAL LOW (ref 8.9–10.3)
Creatinine, Ser: 11.17 mg/dL — ABNORMAL HIGH (ref 0.61–1.24)
GFR, EST AFRICAN AMERICAN: 5 mL/min — AB (ref >60)
GFR, EST NON AFRICAN AMERICAN: 4 mL/min — AB (ref >60)
Glucose, Bld: 118 mg/dL — ABNORMAL HIGH (ref 65–99)
PHOSPHORUS: 3.6 mg/dL (ref 2.5–4.6)
POTASSIUM: 3.3 mmol/L — AB (ref 3.5–5.1)
Sodium: 128 mmol/L — ABNORMAL LOW (ref 135–145)

## 2016-08-06 LAB — CBC
HEMATOCRIT: 30.6 % — AB (ref 39.0–52.0)
Hemoglobin: 9.7 g/dL — ABNORMAL LOW (ref 13.0–17.0)
MCH: 28.3 pg (ref 26.0–34.0)
MCHC: 31.7 g/dL (ref 30.0–36.0)
MCV: 89.2 fL (ref 78.0–100.0)
PLATELETS: 207 10*3/uL (ref 150–400)
RBC: 3.43 MIL/uL — ABNORMAL LOW (ref 4.22–5.81)
RDW: 15.2 % (ref 11.5–15.5)
WBC: 7.3 10*3/uL (ref 4.0–10.5)

## 2016-08-06 LAB — GLUCOSE, CAPILLARY
GLUCOSE-CAPILLARY: 151 mg/dL — AB (ref 65–99)
Glucose-Capillary: 89 mg/dL (ref 65–99)
Glucose-Capillary: 91 mg/dL (ref 65–99)

## 2016-08-06 LAB — CULTURE, BLOOD (ROUTINE X 2)

## 2016-08-06 LAB — ECHO TEE
Reg peak vel: 249 cm/s
TRMAXVEL: 249 cm/s

## 2016-08-06 SURGERY — ECHOCARDIOGRAM, TRANSESOPHAGEAL
Anesthesia: Moderate Sedation

## 2016-08-06 MED ORDER — SODIUM CHLORIDE 0.9 % IV SOLN: 100.0000 mL | INTRAVENOUS | Status: DC | PRN

## 2016-08-06 MED ORDER — FENTANYL CITRATE (PF) 100 MCG/2ML IJ SOLN
INTRAMUSCULAR | Status: AC
  Filled 2016-08-06: qty 2

## 2016-08-06 MED ORDER — DIPHENHYDRAMINE HCL 50 MG/ML IJ SOLN
INTRAMUSCULAR | Status: AC
Start: 2016-08-06 — End: 2016-08-06
  Filled 2016-08-06: qty 1

## 2016-08-06 MED ORDER — LIDOCAINE HCL 1 % IJ SOLN
INTRAMUSCULAR | Status: AC
  Administered 2016-08-06: 4 mL
  Filled 2016-08-06: qty 20

## 2016-08-06 MED ORDER — CEFAZOLIN SODIUM-DEXTROSE 2-4 GM/100ML-% IV SOLN
2.0000 g | INTRAVENOUS | Status: AC
  Administered 2016-08-06: 2 g via INTRAVENOUS
  Filled 2016-08-06: qty 100

## 2016-08-06 MED ORDER — MIDAZOLAM HCL 10 MG/2ML IJ SOLN
INTRAMUSCULAR | Status: DC | PRN
  Administered 2016-08-06 (×2): 2 mg via INTRAVENOUS

## 2016-08-06 MED ORDER — FENTANYL CITRATE (PF) 100 MCG/2ML IJ SOLN
INTRAMUSCULAR | Status: AC
  Filled 2016-08-06: qty 4

## 2016-08-06 MED ORDER — FENTANYL CITRATE (PF) 100 MCG/2ML IJ SOLN
INTRAMUSCULAR | Status: DC | PRN
  Administered 2016-08-06 (×2): 25 ug via INTRAVENOUS

## 2016-08-06 MED ORDER — HEPARIN SODIUM (PORCINE) 1000 UNIT/ML DIALYSIS: 1000.0000 [IU] | INTRAMUSCULAR | Status: DC | PRN

## 2016-08-06 MED ORDER — ALTEPLASE 2 MG IJ SOLR: 2.0000 mg | Freq: Once | INTRAMUSCULAR | Status: DC | PRN

## 2016-08-06 MED ORDER — FENTANYL CITRATE (PF) 100 MCG/2ML IJ SOLN
INTRAMUSCULAR | Status: AC | PRN
  Administered 2016-08-06: 50 ug via INTRAVENOUS

## 2016-08-06 MED ORDER — CEFAZOLIN SODIUM-DEXTROSE 2-4 GM/100ML-% IV SOLN
INTRAVENOUS | Status: AC
  Administered 2016-08-06: 11:00:00
  Filled 2016-08-06: qty 100

## 2016-08-06 MED ORDER — HEPARIN SODIUM (PORCINE) 1000 UNIT/ML IJ SOLN
INTRAMUSCULAR | Status: AC
  Administered 2016-08-06: 1 [IU]
  Filled 2016-08-06: qty 1

## 2016-08-06 MED ORDER — HEPARIN SODIUM (PORCINE) 1000 UNIT/ML DIALYSIS: 20.0000 [IU]/kg | INTRAMUSCULAR | Status: DC | PRN

## 2016-08-06 MED ORDER — MIDAZOLAM HCL 2 MG/2ML IJ SOLN
INTRAMUSCULAR | Status: AC | PRN
  Administered 2016-08-06: 1 mg via INTRAVENOUS

## 2016-08-06 MED ORDER — SODIUM CHLORIDE 0.9 % IV SOLN: INTRAVENOUS | Status: DC

## 2016-08-06 MED ORDER — MIDAZOLAM HCL 2 MG/2ML IJ SOLN
INTRAMUSCULAR | Status: AC
  Filled 2016-08-06: qty 6

## 2016-08-06 MED ORDER — BUTAMBEN-TETRACAINE-BENZOCAINE 2-2-14 % EX AERO
INHALATION_SPRAY | CUTANEOUS | Status: DC | PRN
  Administered 2016-08-06: 2 via TOPICAL

## 2016-08-06 MED ORDER — MIDAZOLAM HCL 5 MG/ML IJ SOLN
INTRAMUSCULAR | Status: AC
  Filled 2016-08-06: qty 2

## 2016-08-06 NOTE — Sedation Documentation (Signed)
Patient is resting comfortably. 

## 2016-08-06 NOTE — Care Management Important Message (Signed)
Important Message  Patient Details  Name: Rodel Mazzoli MRN: [id number] Date of Birth: 07-Aug-1956   Medicare Important Message Given:  Yes    Carrell Rahmani [address]ine [address] 08/06/2016, 10:49 AM

## 2016-08-06 NOTE — Progress Notes (Signed)
  Echocardiogram Echocardiogram Transesophageal has been performed.  James [name] 08/06/2016, 3:48 PM

## 2016-08-06 NOTE — Interval H&P Note (Signed)
History and Physical Interval Note:  08/06/2016 3:11 PM  James [name]  has presented today for surgery, with the diagnosis of Generalized weakness, inability to walk.  The various methods of treatment have been discussed with the patient and family. After consideration of risks, benefits and other options for treatment, the patient has consented to  Procedure(s): TRANSESOPHAGEAL ECHOCARDIOGRAM (TEE) (N/A) as a surgical intervention .  The patient's history has been reviewed, patient examined, no change in status, stable for surgery.  I have reviewed the patient's chart and labs.  Questions were answered to the patient's satisfaction.     Jola Critzer S

## 2016-08-06 NOTE — Progress Notes (Signed)
Patient arrived to unit by bed.  Reviewed treatment plan and this RN agrees with plan.  Report received from bedside RN, Cylil.  Consent verified.  Patient A & O X 4.   Lung sounds clear to ausculation in all fields. Generalized edema. Cardiac:  NSR.  Removed caps and cleansed LIJ catheter with chlorhedxidine.  Aspirated ports of heparin and flushed them with saline per protocol.  Connected and secured lines, initiated treatment at 1700.  UF Goal of 4570mL and net fluid removal 4L.  Will continue to monitor.

## 2016-08-06 NOTE — Progress Notes (Signed)
James [name] for Infectious Disease   Reason for visit: Follow up on MSSA bacteremia  Interval History: TEE done and no vegetation seen; repeat blood culture ngtd, on cefazolin with dialysis; feels much better Day 4 antibiotics Day 3 cefazolin  Physical Exam: Constitutional:  Vitals:   08/06/16 1542 08/06/16 1550  BP:  (!) 108/43  Pulse: 76   Resp:    Temp:     patient appears in NAD Respiratory: Normal respiratory effort; CTA B Cardiovascular: RRR GI: soft, nt  Review of Systems: Constitutional: negative for fevers and chills Gastrointestinal: negative for diarrhea Musculoskeletal: negative for myalgias and arthralgias  Lab Results  Component Value Date   WBC 7.3 08/06/2016   HGB 9.7 (L) 08/06/2016   HCT 30.6 (L) 08/06/2016   MCV 89.2 08/06/2016   PLT 207 08/06/2016    Lab Results  Component Value Date   CREATININE 8.92 (H) 08/05/2016   BUN 41 (H) 08/05/2016   NA 129 (L) 08/05/2016   K 3.1 (L) 08/05/2016   CL 93 (L) 08/05/2016   CO2 25 08/05/2016    Lab Results  Component Value Date   ALT 9 (L) 08/05/2016   AST 15 08/05/2016   ALKPHOS 44 08/05/2016     Microbiology: Recent Results (from the past 240 hour(s))  Blood Culture (routine x 2)     Status: Abnormal   Collection Time: 08/03/16  8:15 AM  Result Value Ref Range Status   Specimen Description BLOOD RIGHT ANTECUBITAL  Final   Special Requests BOTTLES DRAWN AEROBIC AND ANAEROBIC  10CC  Final   Culture  Setup Time   Final    GRAM POSITIVE COCCI IN CLUSTERS IN BOTH AEROBIC AND ANAEROBIC BOTTLES CRITICAL RESULT CALLED TO, READ BACK BY AND VERIFIED WITH: K. HIATT, PHARM AT 2049 ON [id number] BY Rhea Bleacher    Culture STAPHYLOCOCCUS AUREUS (A)  Final   Report Status 08/05/2016 FINAL  Final   Organism ID, Bacteria STAPHYLOCOCCUS AUREUS  Final      Susceptibility   Staphylococcus aureus - MIC*    CIPROFLOXACIN <=0.5 SENSITIVE Sensitive     ERYTHROMYCIN <=0.25 SENSITIVE Sensitive     GENTAMICIN  <=0.5 SENSITIVE Sensitive     OXACILLIN 0.5 SENSITIVE Sensitive     TETRACYCLINE <=1 SENSITIVE Sensitive     VANCOMYCIN <=0.5 SENSITIVE Sensitive     TRIMETH/SULFA <=10 SENSITIVE Sensitive     CLINDAMYCIN <=0.25 SENSITIVE Sensitive     RIFAMPIN <=0.5 SENSITIVE Sensitive     Inducible Clindamycin NEGATIVE Sensitive     * STAPHYLOCOCCUS AUREUS  Blood Culture ID Panel (Reflexed)     Status: Abnormal   Collection Time: 08/03/16  8:15 AM  Result Value Ref Range Status   Enterococcus species NOT DETECTED NOT DETECTED Final   Listeria monocytogenes NOT DETECTED NOT DETECTED Final   Staphylococcus species DETECTED (A) NOT DETECTED Final    Comment: CRITICAL RESULT CALLED TO, READ BACK BY AND VERIFIED WITH: K. HIATT, PHARM AT 2049 ON [id number] BY S. YARBROUGH    Staphylococcus aureus DETECTED (A) NOT DETECTED Final    Comment: CRITICAL RESULT CALLED TO, READ BACK BY AND VERIFIED WITH: K. HIATT, PHARM AT 2049 ON [id number] BY S. YARBROUGH    Methicillin resistance NOT DETECTED NOT DETECTED Final   Streptococcus species NOT DETECTED NOT DETECTED Final   Streptococcus agalactiae NOT DETECTED NOT DETECTED Final   Streptococcus pneumoniae NOT DETECTED NOT DETECTED Final   Streptococcus pyogenes NOT DETECTED NOT DETECTED Final   Acinetobacter  baumannii NOT DETECTED NOT DETECTED Final   Enterobacteriaceae species NOT DETECTED NOT DETECTED Final   Enterobacter cloacae complex NOT DETECTED NOT DETECTED Final   Escherichia coli NOT DETECTED NOT DETECTED Final   Klebsiella oxytoca NOT DETECTED NOT DETECTED Final   Klebsiella pneumoniae NOT DETECTED NOT DETECTED Final   Proteus species NOT DETECTED NOT DETECTED Final   Serratia marcescens NOT DETECTED NOT DETECTED Final   Haemophilus influenzae NOT DETECTED NOT DETECTED Final   Neisseria meningitidis NOT DETECTED NOT DETECTED Final   Pseudomonas aeruginosa NOT DETECTED NOT DETECTED Final   Candida albicans NOT DETECTED NOT DETECTED Final   Candida  glabrata NOT DETECTED NOT DETECTED Final   Candida krusei NOT DETECTED NOT DETECTED Final   Candida parapsilosis NOT DETECTED NOT DETECTED Final   Candida tropicalis NOT DETECTED NOT DETECTED Final  Blood Culture (routine x 2)     Status: Abnormal   Collection Time: 08/03/16  8:20 AM  Result Value Ref Range Status   Specimen Description BLOOD RIGHT HAND  Final   Special Requests BOTTLES DRAWN AEROBIC ONLY  10CC  Final   Culture  Setup Time   Final    GRAM POSITIVE COCCI IN CLUSTERS AEROBIC BOTTLE ONLY CRITICAL RESULT CALLED TO, READ BACK BY AND VERIFIED WITH: K. HIATT, PHARM AT 2049 ON [id number] BY Rhea Bleacher    Culture (A)  Final    STAPHYLOCOCCUS AUREUS SUSCEPTIBILITIES PERFORMED ON PREVIOUS CULTURE WITHIN THE LAST 5 DAYS.    Report Status 08/05/2016 FINAL  Final  MRSA PCR Screening     Status: None   Collection Time: 08/03/16  8:54 PM  Result Value Ref Range Status   MRSA by PCR NEGATIVE NEGATIVE Final    Comment:        The GeneXpert MRSA Assay (FDA approved for NASAL specimens only), is one component of a comprehensive MRSA colonization surveillance program. It is not intended to diagnose MRSA infection nor to guide or monitor treatment for MRSA infections.   Cath Tip Culture     Status: Abnormal (Preliminary result)   Collection Time: 08/04/16  9:23 AM  Result Value Ref Range Status   Specimen Description CATH TIP  Final   Special Requests NONE  Final   Culture (A)  Final    10,000 COLONIES/mL STAPHYLOCOCCUS AUREUS 20,000 COLONIES/mL STAPHYLOCOCCUS SPECIES (COAGULASE NEGATIVE)    Report Status PENDING  Incomplete  Culture, blood (routine x 2)     Status: Abnormal   Collection Time: 08/04/16 12:25 PM  Result Value Ref Range Status   Specimen Description BLOOD RIGHT HAND  Final   Special Requests IN PEDIATRIC BOTTLE 1.0CC  Final   Culture  Setup Time   Final    GRAM POSITIVE COCCI IN CLUSTERS IN PEDIATRIC BOTTLE CRITICAL VALUE NOTED.  VALUE IS CONSISTENT WITH  PREVIOUSLY REPORTED AND CALLED VALUE.    Culture (A)  Final    STAPHYLOCOCCUS AUREUS SUSCEPTIBILITIES PERFORMED ON PREVIOUS CULTURE WITHIN THE LAST 5 DAYS.    Report Status 08/06/2016 FINAL  Final  Culture, blood (routine x 2)     Status: None (Preliminary result)   Collection Time: 08/04/16 12:28 PM  Result Value Ref Range Status   Specimen Description BLOOD RIGHT HAND  Final   Special Requests BOTTLES DRAWN AEROBIC ONLY 5CC  Final   Culture NO GROWTH 2 DAYS  Final   Report Status PENDING  Incomplete  Culture, blood (routine x 2)     Status: None (Preliminary result)   Collection Time:  08/05/16  8:54 AM  Result Value Ref Range Status   Specimen Description BLOOD RIGHT ARM  Final   Special Requests BOTTLES DRAWN AEROBIC ONLY 5CC  Final   Culture NO GROWTH 1 DAY  Final   Report Status PENDING  Incomplete  Culture, blood (routine x 2)     Status: None (Preliminary result)   Collection Time: 08/05/16  8:54 AM  Result Value Ref Range Status   Specimen Description BLOOD RIGHT HAND  Final   Special Requests BOTTLES DRAWN AEROBIC ONLY 5CC  Final   Culture NO GROWTH 1 DAY  Final   Report Status PENDING  Incomplete    Impression/Plan:  1. MSSA catheter infection - had line holiday, now replaced.  TEE without concerns.  If repeat blood culture 8/31 remains negative, can continue with cefazolin with dialysis through September 13th.   2. Sepsis - resolving  I will sign off, please call with any changes. thanks

## 2016-08-06 NOTE — Progress Notes (Signed)
[facility] Progress Note  Assessment/Plan: 1. MSSA Bacteremia /TDC infection:Per primary.  On  Vanc/Zosyn   IR removed [facility] 8/30  Leave out for 48 hour catheter holiday.  New TDC to be placed today  MSSA detected in initial blood cx. repeat cultures pending. For TEE today per ID recs 2. ESRD- T,TH,S [place].  K+ 3.1. 4.0 K bath for HD today off schedule after cath placed.  3. Hypertension/volume- Take hydralazine/Metoprolol for hypertension. BP controlled at present. Standing wt 97.6 kg. Net UF 3L post weight 94.6 kg - UF goal 4L  today 4. Anemia- HGB 9.8. No ESA needed now. Follow HGB 5. Metabolic bone disease- continue VDRA. Hold binders Phos - 1.9 6. Nutrition- Albumin 2.3 Regular diet/renal vit/nepro 7. CHF: Per primary. On beta blocker/isosorbide mononitrate  8. DM: Per primary  Lynnda Child, PA-[facility] 08/06/2016,9:45 AM  LOS: 3 days    Renal Attending; I agree with note above.  He is for new [facility] and TEE today. [facility]   Subjective:  Sleepy, James/o about being NPO but otherwise feels fine. For [facility] placement today  Objective Vitals:   08/05/16 0947 08/05/16 1700 08/05/16 2112 08/06/16 0558  BP: 109/63 (!) 102/58 135/76 130/76  Pulse: 85 75 89 88  Resp: 18 18 18 18   Temp: 99.1 F (37.3 James) 98.9 F (37.2 James) 100 F (37.8 James) 99.6 F (37.6 James)  TempSrc: Oral Oral Oral Oral  SpO2: 98% 100% 97% 96%  Weight:   97.7 kg (215 lb 4.8 oz)   Height:   5\' 5"  (1.651 m)    General: WNWD middle aged male NAD Heart: RRR S1 S2 Lungs: CTAB breathing unlabored  Abdomen: soft NT ND Extremities: no LE edema  Dialysis Access: R TDC site bandaged LUA AVF + bruit  Dialysis Orders: [facility], T,TH,S 4 hours 400/Auto 2.0 EDW 91 kg 2.0K/2.25 Ca Heparin 8000 units IV q treatment Mircera 75 mcg IV q 4 weeks (Last dose 07/27/16 Last in-center HGB 11.0, Fe 53 Tsat 25%) Calcitriol 0.5 mcg PO q tx (Last PTH 397  06/24/16)  Additional Objective Labs: Basic Metabolic Panel:  Recent Labs Lab 08/03/16 2024 08/04/16 0359 08/05/16 0427  NA 131* 132* 129*  K 3.3* 3.2* 3.1*  CL 95* 96* 93*  CO2 24 28 25   GLUCOSE 190* 201* 134*  BUN 19 24* 41*  CREATININE 5.39* 6.78* 8.92*  CALCIUM 8.0* 8.0* 8.3*  PHOS 1.9*  --   --    Liver Function Tests:  Recent Labs Lab 08/03/16 2024 08/04/16 0359 08/05/16 0427  AST  --  16 15  ALT  --  9* 9*  ALKPHOS  --  45 44  BILITOT  --  0.5 0.6  PROT  --  7.8 7.6  ALBUMIN 2.9* 2.6* 2.3*   No results for input(s): LIPASE, AMYLASE in the last 168 hours. CBC:  Recent Labs Lab 08/03/16 0710  08/03/16 2024 08/04/16 0359 08/05/16 0427 08/06/16 0634  WBC 15.3*  --  15.4* 15.0* 9.7 7.3  NEUTROABS 13.6*  --   --   --   --   --   HGB 10.6*  < > 11.2* 10.5* 9.8* 9.7*  HCT 32.3*  < > 34.6* 32.1* 31.0* 30.6*  MCV 88.0  --  89.4 88.7 89.6 89.2  PLT 217  --  189 186 181 207  < > = values in this interval not displayed. Blood Culture    Component Value Date/Time  SDES BLOOD RIGHT HAND 08/04/2016 1228   SPECREQUEST BOTTLES DRAWN AEROBIC ONLY 5CC 08/04/2016 1228   CULT NO GROWTH 1 DAY 08/04/2016 1228   REPTSTATUS PENDING 08/04/2016 1228    Cardiac Enzymes:  Recent Labs Lab 08/03/16 0818 08/05/16 0427  TROPONINI 0.06* 0.03*   CBG:  Recent Labs Lab 08/04/16 2119 08/05/16 0805 08/05/16 1157 08/05/16 1637 08/05/16 2119  GLUCAP 179* 109* 182* 225* 176*   Iron Studies: No results for input(s): IRON, TIBC, TRANSFERRIN, FERRITIN in the last 72 hours. Lab Results  Component Value Date   INR 1.41 08/03/2016   INR 1.43 08/03/2016   INR 1.21 04/02/2015   Studies/Results: No results found. Medications:   . [START ON 08/07/2016] calcitRIOL  0.5 mcg Oral Q T,Th,Sa-HD  . [START ON 08/07/2016]  ceFAZolin (ANCEF) IV  2 g Intravenous Q T,Th,Sa-HD  .  ceFAZolin (ANCEF) IV  2 g Intravenous to XRAY  . Darbepoetin Alfa  100 mcg Intravenous Q Thu-HD  .  feeding supplement (PRO-STAT SUGAR FREE 64)  30 mL Oral BID  . gabapentin  300 mg Oral QHS  . [START ON 08/07/2016] heparin  5,000 Units Subcutaneous Q8H  . insulin aspart  0-5 Units Subcutaneous QHS  . insulin aspart  0-9 Units Subcutaneous TID WC  . insulin detemir  40 Units Subcutaneous QHS  . polyethylene glycol  17 g Oral Daily  . senna-docusate  1 tablet Oral BID

## 2016-08-06 NOTE — H&P (View-Only) (Signed)
Referring Physician:  Kolten Ryback is an 60 y.o. male.                       Chief Complaint: MSSA bacteremia in renal patient.  HPI: 60 year old male has recent history of MSSA infection probably from dialysis catheter and treated with cefazolin. He needs TEE to guide duration of therapy. Patient denies chest pain or shortness of breath.  Past Medical History:  Diagnosis Date  . Asthma    as a child  . CHF (congestive heart failure) ([place])   . Chronic kidney disease    TTHS  . Diabetes mellitus without complication (HCC)    Type II  . ESRD (end stage renal disease) ([place])   . Hypertension       Past Surgical History:  Procedure Laterality Date  . AV FISTULA PLACEMENT Left 04/08/2015   Procedure: LEFT RADIOCEPHALIC ARTERIOVENOUS (AV) FISTULA CREATION;  Surgeon: Elam Dutch, MD;  Location: [place];  Service: Vascular;  Laterality: Left;  . AV FISTULA PLACEMENT Right 09/15/2015   Procedure: RADIOCEPHALIC VERSUS BRACHIOCEPHALIC ARTERIOVENOUS (AV) FISTULA CREATION - RIGHT ARM;  Surgeon: Conrad Han[place], MD;  Location: [place];  Service: Vascular;  Laterality: Right;  . AV FISTULA PLACEMENT Left 06/07/2016   Procedure: LEFT BRACHIOCEPHALIC ARTERIOVENOUS (AV) FISTULA CREATION;  Surgeon: Elam Dutch, MD;  Location: [place];  Service: Vascular;  Laterality: Left;  . COLONOSCOPY    . IR GENERIC HISTORICAL  08/04/2016   IR REMOVAL TUN CV CATH W/O FL 08/04/2016 Ascencion Dike, PA-C [facility]-INTERV RAD  . LIGATION OF ARTERIOVENOUS  FISTULA Left 09/15/2015   Procedure: LIGATION OF RADIOCEPHALIC ARTERIOVENOUS FISTULA - LEFT ARM;  Surgeon: Conrad [place], MD;  Location: [place];  Service: Vascular;  Laterality: Left;  . LIGATION OF ARTERIOVENOUS  FISTULA Right 04/16/2016   Procedure: LIGATION OF ARTERIOVENOUS  FISTULA;  Surgeon: Elam Dutch, MD;  Location: [place];  Service: Vascular;  Laterality: Right;  . LIGATION OF COMPETING BRANCHES OF ARTERIOVENOUS FISTULA Left 07/16/2015   Procedure: LIGATION OF SIDE  BRANCHES OF ARTERIOVENOUS FISTULA LEFT ARM;  Surgeon: Rosetta Posner, MD;  Location: [place];  Service: Vascular;  Laterality: Left;  . PERIPHERAL VASCULAR CATHETERIZATION N/A 07/04/2015   Procedure: Fistulagram;  Surgeon: Elam Dutch, MD;  Location: [place] [facility];  Service: Cardiovascular;  Laterality: N/A;  . PERIPHERAL VASCULAR CATHETERIZATION N/A 09/08/2015   Procedure: Fistulagram;  Surgeon: Conrad [place], MD;  Location: [place] [facility];  Service: Cardiovascular;  Laterality: N/A;  . PERIPHERAL VASCULAR CATHETERIZATION Right 02/23/2016   Procedure: Fistulagram;  Surgeon: Conrad [place], MD;  Location: [place] [facility];  Service: Cardiovascular;  Laterality: Right;  . PERIPHERAL VASCULAR CATHETERIZATION N/A 04/12/2016   Procedure: Fistulagram;  Surgeon: Conrad [place], MD;  Location: [place] [facility];  Service: Cardiovascular;  Laterality: N/A;  . REVISON OF ARTERIOVENOUS FISTULA Right 03/10/2016   Procedure: RIGHT CEPHALIC VEIN TURNDOWN;  Surgeon: Conrad [place], MD;  Location: [place];  Service: Vascular;  Laterality: Right;    Family History  Problem Relation Age of Onset  . Hypertension Other    Social History:  reports that he quit smoking about 11 years ago. His smoking use included Cigarettes. He quit after 1.00 year of use. He has never used smokeless tobacco. He reports that he does not drink alcohol or use drugs.  Allergies: No Known Allergies  Medications Prior to Admission  Medication Sig  Dispense Refill  . calcium acetate (PHOSLO) 667 MG capsule Take 2 capsules (1,334 mg total) by mouth 3 (three) times daily with meals. 90 capsule 0  . Darbepoetin Alfa (ARANESP) 100 MCG/0.5ML SOSY injection Inject 0.5 mLs (100 mcg total) into the vein every Thursday with hemodialysis. Will be given at dialysis.    Marland Kitchen gabapentin (NEURONTIN) 300 MG capsule Take 1 capsule (300 mg total) by mouth at bedtime. 30 capsule 0  . insulin aspart (NOVOLOG) 100 UNIT/ML injection Inject 6 Units into the skin  3 (three) times daily before meals. 3 vial PRN  . insulin detemir (LEVEMIR) 100 UNIT/ML injection Inject 40 Units into the skin at bedtime.     . isosorbide mononitrate (IMDUR) 60 MG 24 hr tablet Take 1 tablet (60 mg total) by mouth daily. 30 tablet 0  . Vitamin D, Ergocalciferol, (DRISDOL) 50000 units CAPS capsule Take 50,000 Units by mouth every 7 (seven) days. Monday      Results for orders placed or performed during the hospital encounter of 08/03/16 (from the past 48 hour(s))  Procalcitonin     Status: None   Collection Time: 08/03/16  8:24 PM  Result Value Ref Range   Procalcitonin 148.84 ng/mL    Comment:        Interpretation: PCT >= 10 ng/mL: Important systemic inflammatory response, almost exclusively due to severe bacterial sepsis or septic shock. (NOTE)         ICU PCT Algorithm               Non ICU PCT Algorithm    ----------------------------     ------------------------------         PCT < 0.25 ng/mL                 PCT < 0.1 ng/mL     Stopping of antibiotics            Stopping of antibiotics       strongly encouraged.               strongly encouraged.    ----------------------------     ------------------------------       PCT level decrease by               PCT < 0.25 ng/mL       >= 80% from peak PCT       OR PCT 0.25 - 0.5 ng/mL          Stopping of antibiotics                                             encouraged.     Stopping of antibiotics           encouraged.    ----------------------------     ------------------------------       PCT level decrease by              PCT >= 0.25 ng/mL       < 80% from peak PCT        AND PCT >= 0.5 ng/mL             Continuing antibiotics  encouraged.       Continuing antibiotics            encouraged.    ----------------------------     ------------------------------     PCT level increase compared          PCT > 0.5 ng/mL         with peak PCT AND          PCT >= 0.5 ng/mL              Escalation of antibiotics                                          strongly encouraged.      Escalation of antibiotics        strongly encouraged.   Protime-INR     Status: Abnormal   Collection Time: 08/03/16  8:24 PM  Result Value Ref Range   Prothrombin Time 17.4 (H) 11.4 - 15.2 seconds   INR 1.41   APTT     Status: None   Collection Time: 08/03/16  8:24 PM  Result Value Ref Range   aPTT 29 24 - 36 seconds  Renal function panel     Status: Abnormal   Collection Time: 08/03/16  8:24 PM  Result Value Ref Range   Sodium 131 (L) 135 - 145 mmol/L   Potassium 3.3 (L) 3.5 - 5.1 mmol/L    Comment: DELTA CHECK NOTED   Chloride 95 (L) 101 - 111 mmol/L   CO2 24 22 - 32 mmol/L   Glucose, Bld 190 (H) 65 - 99 mg/dL   BUN 19 6 - 20 mg/dL   Creatinine, Ser 5.39 (H) 0.61 - 1.24 mg/dL    Comment: DIALYSIS   Calcium 8.0 (L) 8.9 - 10.3 mg/dL   Phosphorus 1.9 (L) 2.5 - 4.6 mg/dL   Albumin 2.9 (L) 3.5 - 5.0 g/dL   GFR calc non Af Amer 11 (L) >60 mL/min   GFR calc Af Amer 12 (L) >60 mL/min    Comment: (NOTE) The eGFR has been calculated using the CKD EPI equation. This calculation has not been validated in all clinical situations. eGFR's persistently <60 mL/min signify possible Chronic Kidney Disease.    Anion gap 12 5 - 15  CBC     Status: Abnormal   Collection Time: 08/03/16  8:24 PM  Result Value Ref Range   WBC 15.4 (H) 4.0 - 10.5 K/uL   RBC 3.87 (L) 4.22 - 5.81 MIL/uL   Hemoglobin 11.2 (L) 13.0 - 17.0 g/dL   HCT 34.6 (L) 39.0 - 52.0 %   MCV 89.4 78.0 - 100.0 fL   MCH 28.9 26.0 - 34.0 pg   MCHC 32.4 30.0 - 36.0 g/dL   RDW 15.3 11.5 - 15.5 %   Platelets 189 150 - 400 K/uL  MRSA PCR Screening     Status: None   Collection Time: 08/03/16  8:54 PM  Result Value Ref Range   MRSA by PCR NEGATIVE NEGATIVE    Comment:        The GeneXpert MRSA Assay (FDA approved for NASAL specimens only), is one component of a comprehensive MRSA colonization surveillance program. It is  not intended to diagnose MRSA infection nor to guide or monitor treatment for MRSA infections.   Glucose, capillary     Status: Abnormal   Collection Time: 08/03/16  9:07  PM  Result Value Ref Range   Glucose-Capillary 223 (H) 65 - 99 mg/dL  CBC     Status: Abnormal   Collection Time: 08/04/16  3:59 AM  Result Value Ref Range   WBC 15.0 (H) 4.0 - 10.5 K/uL   RBC 3.62 (L) 4.22 - 5.81 MIL/uL   Hemoglobin 10.5 (L) 13.0 - 17.0 g/dL   HCT 32.1 (L) 39.0 - 52.0 %   MCV 88.7 78.0 - 100.0 fL   MCH 29.0 26.0 - 34.0 pg   MCHC 32.7 30.0 - 36.0 g/dL   RDW 15.3 11.5 - 15.5 %   Platelets 186 150 - 400 K/uL  Comprehensive metabolic panel     Status: Abnormal   Collection Time: 08/04/16  3:59 AM  Result Value Ref Range   Sodium 132 (L) 135 - 145 mmol/L   Potassium 3.2 (L) 3.5 - 5.1 mmol/L   Chloride 96 (L) 101 - 111 mmol/L   CO2 28 22 - 32 mmol/L   Glucose, Bld 201 (H) 65 - 99 mg/dL   BUN 24 (H) 6 - 20 mg/dL   Creatinine, Ser 6.78 (H) 0.61 - 1.24 mg/dL   Calcium 8.0 (L) 8.9 - 10.3 mg/dL   Total Protein 7.8 6.5 - 8.1 g/dL   Albumin 2.6 (L) 3.5 - 5.0 g/dL   AST 16 15 - 41 U/L   ALT 9 (L) 17 - 63 U/L   Alkaline Phosphatase 45 38 - 126 U/L   Total Bilirubin 0.5 0.3 - 1.2 mg/dL   GFR calc non Af Amer 8 (L) >60 mL/min   GFR calc Af Amer 9 (L) >60 mL/min    Comment: (NOTE) The eGFR has been calculated using the CKD EPI equation. This calculation has not been validated in all clinical situations. eGFR's persistently <60 mL/min signify possible Chronic Kidney Disease.    Anion gap 8 5 - 15  Glucose, capillary     Status: Abnormal   Collection Time: 08/04/16  7:38 AM  Result Value Ref Range   Glucose-Capillary 154 (H) 65 - 99 mg/dL   Comment 1 Notify RN    Comment 2 Document in Chart   Cath Tip Culture     Status: None (Preliminary result)   Collection Time: 08/04/16  9:23 AM  Result Value Ref Range   Specimen Description CATH TIP    Special Requests NONE    Culture CULTURE REINCUBATED  FOR BETTER GROWTH    Report Status PENDING   Glucose, capillary     Status: Abnormal   Collection Time: 08/04/16 11:45 AM  Result Value Ref Range   Glucose-Capillary 197 (H) 65 - 99 mg/dL   Comment 1 Notify RN    Comment 2 Document in Chart   Culture, blood (routine x 2)     Status: None (Preliminary result)   Collection Time: 08/04/16 12:25 PM  Result Value Ref Range   Specimen Description BLOOD RIGHT HAND    Special Requests IN PEDIATRIC BOTTLE 1.0CC    Culture  Setup Time      GRAM POSITIVE COCCI IN CLUSTERS IN PEDIATRIC BOTTLE CRITICAL VALUE NOTED.  VALUE IS CONSISTENT WITH PREVIOUSLY REPORTED AND CALLED VALUE.    Culture GRAM POSITIVE COCCI    Report Status PENDING   Culture, blood (routine x 2)     Status: None (Preliminary result)   Collection Time: 08/04/16 12:28 PM  Result Value Ref Range   Specimen Description BLOOD RIGHT HAND    Special Requests BOTTLES DRAWN AEROBIC  ONLY 5CC    Culture NO GROWTH 1 DAY    Report Status PENDING   Glucose, capillary     Status: Abnormal   Collection Time: 08/04/16  5:53 PM  Result Value Ref Range   Glucose-Capillary 157 (H) 65 - 99 mg/dL   Comment 1 Notify RN    Comment 2 Document in Chart   Glucose, capillary     Status: Abnormal   Collection Time: 08/04/16  9:19 PM  Result Value Ref Range   Glucose-Capillary 179 (H) 65 - 99 mg/dL  CBC     Status: Abnormal   Collection Time: 08/05/16  4:27 AM  Result Value Ref Range   WBC 9.7 4.0 - 10.5 K/uL   RBC 3.46 (L) 4.22 - 5.81 MIL/uL   Hemoglobin 9.8 (L) 13.0 - 17.0 g/dL   HCT 31.0 (L) 39.0 - 52.0 %   MCV 89.6 78.0 - 100.0 fL   MCH 28.3 26.0 - 34.0 pg   MCHC 31.6 30.0 - 36.0 g/dL   RDW 15.2 11.5 - 15.5 %   Platelets 181 150 - 400 K/uL  Comprehensive metabolic panel     Status: Abnormal   Collection Time: 08/05/16  4:27 AM  Result Value Ref Range   Sodium 129 (L) 135 - 145 mmol/L   Potassium 3.1 (L) 3.5 - 5.1 mmol/L   Chloride 93 (L) 101 - 111 mmol/L   CO2 25 22 - 32 mmol/L    Glucose, Bld 134 (H) 65 - 99 mg/dL   BUN 41 (H) 6 - 20 mg/dL   Creatinine, Ser 8.92 (H) 0.61 - 1.24 mg/dL   Calcium 8.3 (L) 8.9 - 10.3 mg/dL   Total Protein 7.6 6.5 - 8.1 g/dL   Albumin 2.3 (L) 3.5 - 5.0 g/dL   AST 15 15 - 41 U/L   ALT 9 (L) 17 - 63 U/L   Alkaline Phosphatase 44 38 - 126 U/L   Total Bilirubin 0.6 0.3 - 1.2 mg/dL   GFR calc non Af Amer 6 (L) >60 mL/min   GFR calc Af Amer 7 (L) >60 mL/min    Comment: (NOTE) The eGFR has been calculated using the CKD EPI equation. This calculation has not been validated in all clinical situations. eGFR's persistently <60 mL/min signify possible Chronic Kidney Disease.    Anion gap 11 5 - 15  Troponin I     Status: Abnormal   Collection Time: 08/05/16  4:27 AM  Result Value Ref Range   Troponin I 0.03 (HH) <0.03 ng/mL    Comment: CRITICAL VALUE NOTED.  VALUE IS CONSISTENT WITH PREVIOUSLY REPORTED AND CALLED VALUE.  Glucose, capillary     Status: Abnormal   Collection Time: 08/05/16  8:05 AM  Result Value Ref Range   Glucose-Capillary 109 (H) 65 - 99 mg/dL  Glucose, capillary     Status: Abnormal   Collection Time: 08/05/16 11:57 AM  Result Value Ref Range   Glucose-Capillary 182 (H) 65 - 99 mg/dL   Ir Removal Tun Cv Cath W/o Fl  Result Date: 08/04/2016 INDICATION: Bacteremia. End-stage renal disease. Request removal of tunneled hemodialysis catheter. Catheter originally placed on 04/04/2015 by Dr. Barbie Banner. EXAM: REMOVAL TUNNELED CENTRAL VENOUS HEMODIALYSIS CATHETER MEDICATIONS: None; ANESTHESIA/SEDATION: Moderate Sedation Time:  None The patient's level of consciousness and vital signs were monitored continuously by radiology nursing throughout the procedure under my direct supervision. FLUOROSCOPY TIME:  Fluoroscopy Time: None COMPLICATIONS: None immediate. PROCEDURE: Informed written consent was obtained from the patient after a  thorough discussion of the procedural risks, benefits and alternatives. All questions were addressed. Maximal  Sterile Barrier Technique was utilized including caps, mask, sterile gowns, sterile gloves, sterile drape, hand hygiene and skin antiseptic. A timeout was performed prior to the initiation of the procedure. The patient's right chest and catheter was prepped and draped in a normal sterile fashion. Heparin was removed from both ports of catheter. 1% lidocaine was used for local anesthesia. Using gentle blunt dissection the cuff of the catheter was exposed and the catheter was removed in it's entirety. Pressure was held till hemostasis was obtained. A sterile dressing was applied. The patient tolerated the procedure well with no immediate complications. IMPRESSION: Successful tunneled hemodialysis catheter removal as described above. Read by: Ascencion Dike PA-C Electronically Signed   By: Sandi Mariscal M.D.   On: 08/04/2016 09:47    Review Of Systems Constitutional: Positive for fatigue, negative for fever or chills. HENT: Negative for sore throat. Respiratory: Negative for astma, COPD or shortness of breath. Cardiology: Negative for chest pain, palpitations. Gastrointestinal: No nausea, vomiting, diarrhea Musculoskeletal: No arthritis or malgia Skin: No rash Neurological: No stroke, seizures or headaches. Psychiatry: No psych admission for suicide.  Blood pressure 109/63, pulse 85, temperature 99.1 F (37.3 C), temperature source Oral, resp. rate 18, height 5' 5"  (1.651 m), weight 94.4 kg (208 lb 1.8 oz), SpO2 98 %. General appearance: alert, cooperative, appears older than stated age and no distress Head: Normocephalic, without obvious abnormality, atraumatic Eyes: Brown eyes, conjunctivae/corneas clear. PERRL, EOM's intact.  Neck: no adenopathy, no carotid bruit, no JVD, supple, trachea midline and thyroid not enlarged.  Resp: clear to auscultation bilaterally Cardio: regular rate and rhythm, S1, S2 normal, no murmur, click, rub or gallop. GI: soft, non-tender; bowel sounds normal; no masses,  no  organomegaly Extremities: extremities normal, atraumatic, no cyanosis or edema Skin: Warm and dry. Neurologic: Alert and oriented X 3, normal strength and tone.   Assessment/Plan MSSA bacteremia End stage renal disease Hypertension Anemia Metabolic bone disease DM, II H/O Diastolic heart failure  TEE tomorrow. Patient understood procedure and risks.  Birdie Riddle, MD  08/05/2016, 4:10 PM

## 2016-08-06 NOTE — Progress Notes (Signed)
James [name]  ONG:[id number] DOB: [birth year] DOA: 08/03/2016 PCP: Frederica Kuster, MD    Brief Narrative:  60 y.o. ? 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   In the ED he was found to have a temp of 103, HR 113, and RR 24 w/ WBC 15.3.  His RIJ TDC had been present since 04/04/2015.   This was removed on 08/04/2016 Blood cultures grew MSSA bacteremia and antibiotics were narrowed to cefazolin Repeat cultures performed 8/30 are pending  Subjective: Patient seen peri-procedurally-is hungry No other issues Just finished getting TEE which appears to be wnl,  No further cp n,v,sob   Assessment & Plan:  Sepsis due to Staph bacteremia (MSSA) - suspected HD cath infection  Antibiotics per ID  - no evidence on TTE of endocarditis  - TEE performed by Dr Algie Coffer no evidences of endocarditis -  rpt BC done 08/05/16 NGTD so far   White count 15--->9.7-->7.3 currently  ESRD on T/Th/Sat HD Nephrology following dialysis access currently out -L tunneled IJ HD cath placed 08/06/16   Elevated troponin Suspect this is simply stress-related in setting of ESRD - - no chest pain  Chronic systolic congestive heart failure EF on TTE 60-65% -which is an improvement since his last echo in 2016  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, nephropathy  CBG 86-->220's  - continue to follow on supplemental coverage -continue Levemir 40 qhs  Anemia of chronic kidney disease No evidence of acute blood loss  -hemoglobin is stable in 9-10 range  Hypertension Blood pressure currently well controlled  DVT prophylaxis: Subcutaneous heparin Code Status: FULL CODE Family Communication: no family present Disposition Plan: Stable for transfer to renal bed  Consultants:  Nephrology ID  Procedures: 8/30 TDC removal per IR  8/30 TTE - EF is 60-65% - wall motion  normal - grade 1 diastolic dysfunction 08/06/16 TEE no vegetations   Antimicrobials:  Zosyn 8/29  Vancomycin 8/29  Ceftaz 8/29 >  Objective: Blood pressure (!) 157/76, pulse 81, temperature 98.1 F (36.7 C), temperature source Oral, resp. rate 20, height 5\' 5"  (1.651 m), weight 97.7 kg (215 lb 4.8 oz), SpO2 98 %.  Intake/Output Summary (Last 24 hours) at 08/06/16 1535 Last data filed at 08/06/16 1300  Gross per 24 hour  Intake              240 ml  Output                0 ml  Net              240 ml   Filed Weights   08/04/16 0313 08/04/16 2146 08/05/16 2112  Weight: 95.1 kg (209 lb 10.5 oz) 94.4 kg (208 lb 1.8 oz) 97.7 kg (215 lb 4.8 oz)    Examination: General: No acute respiratory distress,stilla  Little sleepy from sedation for TEE Lungs: Clear to auscultation bilaterally  Cardiovascular: s1 s2 no m/r/g  CBC:  Recent Labs Lab 08/03/16 0710 08/03/16 0731 08/03/16 2024 08/04/16 0359 08/05/16 0427 08/06/16 0634  WBC 15.3*  --  15.4* 15.0* 9.7 7.3  NEUTROABS 13.6*  --   --   --   --   --   HGB 10.6* 11.9* 11.2* 10.5* 9.8* 9.7*  HCT 32.3* 35.0* 34.6* 32.1* 31.0* 30.6*  MCV 88.0  --  89.4 88.7 89.6 89.2  PLT  217  --  189 186 181 207   Basic Metabolic Panel:  Recent Labs Lab 08/03/16 0710 08/03/16 0731 08/03/16 2024 08/04/16 0359 08/05/16 0427  NA 124* 128* 131* 132* 129*  K 4.1 4.1 3.3* 3.2* 3.1*  CL 92* 93* 95* 96* 93*  CO2 17*  --  24 28 25   GLUCOSE 375* 378* 190* 201* 134*  BUN 53* 51* 19 24* 41*  CREATININE 11.12* 11.80* 5.39* 6.78* 8.92*  CALCIUM 8.2*  --  8.0* 8.0* 8.3*  PHOS  --   --  1.9*  --   --    GFR: Estimated Creatinine Clearance: 9.7 mL/min (by C-G formula based on SCr of 8.92 mg/dL).  Liver Function Tests:  Recent Labs Lab 08/03/16 2024 08/04/16 0359 08/05/16 0427  AST  --  16 15  ALT  --  9* 9*  ALKPHOS  --  45 44  BILITOT  --  0.5 0.6  PROT  --  7.8 7.6  ALBUMIN 2.9* 2.6* 2.3*   Coagulation Profile:  Recent Labs Lab  08/03/16 0818 08/03/16 2024  INR 1.43 1.41    Cardiac Enzymes:  Recent Labs Lab 08/03/16 0818 08/05/16 0427  TROPONINI 0.06* 0.03*    HbA1C: Hgb A1c MFr Bld  Date/Time Value Ref Range Status  03/22/2015 10:52 PM 7.2 (H) 4.8 - 5.6 % Final    Comment:    (NOTE)         Pre-diabetes: 5.7 - 6.4         Diabetes: >6.4         Glycemic control for adults with diabetes: <7.0     CBG:  Recent Labs Lab 08/05/16 0805 08/05/16 1157 08/05/16 1637 08/05/16 2119 08/06/16 1331  GLUCAP 109* 182* 225* 176* 89    Recent Results (from the past 240 hour(s))  Blood Culture (routine x 2)     Status: Abnormal   Collection Time: 08/03/16  8:15 AM  Result Value Ref Range Status   Specimen Description BLOOD RIGHT ANTECUBITAL  Final   Special Requests BOTTLES DRAWN AEROBIC AND ANAEROBIC  10CC  Final   Culture  Setup Time   Final    GRAM POSITIVE COCCI IN CLUSTERS IN BOTH AEROBIC AND ANAEROBIC BOTTLES CRITICAL RESULT CALLED TO, READ BACK BY AND VERIFIED WITH: K. HIATT, PHARM AT 2049 ON [id number] BY Lucienne Capers    Culture STAPHYLOCOCCUS AUREUS (A)  Final   Report Status 08/05/2016 FINAL  Final   Organism ID, Bacteria STAPHYLOCOCCUS AUREUS  Final      Susceptibility   Staphylococcus aureus - MIC*    CIPROFLOXACIN <=0.5 SENSITIVE Sensitive     ERYTHROMYCIN <=0.25 SENSITIVE Sensitive     GENTAMICIN <=0.5 SENSITIVE Sensitive     OXACILLIN 0.5 SENSITIVE Sensitive     TETRACYCLINE <=1 SENSITIVE Sensitive     VANCOMYCIN <=0.5 SENSITIVE Sensitive     TRIMETH/SULFA <=10 SENSITIVE Sensitive     CLINDAMYCIN <=0.25 SENSITIVE Sensitive     RIFAMPIN <=0.5 SENSITIVE Sensitive     Inducible Clindamycin NEGATIVE Sensitive     * STAPHYLOCOCCUS AUREUS  Blood Culture ID Panel (Reflexed)     Status: Abnormal   Collection Time: 08/03/16  8:15 AM  Result Value Ref Range Status   Enterococcus species NOT DETECTED NOT DETECTED Final   Listeria monocytogenes NOT DETECTED NOT DETECTED Final    Staphylococcus species DETECTED (A) NOT DETECTED Final    Comment: CRITICAL RESULT CALLED TO, READ BACK BY AND VERIFIED WITH: K. HIATT, PHARM AT  2049 ON [id number] BY S. YARBROUGH    Staphylococcus aureus DETECTED (A) NOT DETECTED Final    Comment: CRITICAL RESULT CALLED TO, READ BACK BY AND VERIFIED WITH: K. HIATT, PHARM AT 2049 ON [id number] BY S. YARBROUGH    Methicillin resistance NOT DETECTED NOT DETECTED Final   Streptococcus species NOT DETECTED NOT DETECTED Final   Streptococcus agalactiae NOT DETECTED NOT DETECTED Final   Streptococcus pneumoniae NOT DETECTED NOT DETECTED Final   Streptococcus pyogenes NOT DETECTED NOT DETECTED Final   Acinetobacter baumannii NOT DETECTED NOT DETECTED Final   Enterobacteriaceae species NOT DETECTED NOT DETECTED Final   Enterobacter cloacae complex NOT DETECTED NOT DETECTED Final   Escherichia coli NOT DETECTED NOT DETECTED Final   Klebsiella oxytoca NOT DETECTED NOT DETECTED Final   Klebsiella pneumoniae NOT DETECTED NOT DETECTED Final   Proteus species NOT DETECTED NOT DETECTED Final   Serratia marcescens NOT DETECTED NOT DETECTED Final   Haemophilus influenzae NOT DETECTED NOT DETECTED Final   Neisseria meningitidis NOT DETECTED NOT DETECTED Final   Pseudomonas aeruginosa NOT DETECTED NOT DETECTED Final   Candida albicans NOT DETECTED NOT DETECTED Final   Candida glabrata NOT DETECTED NOT DETECTED Final   Candida krusei NOT DETECTED NOT DETECTED Final   Candida parapsilosis NOT DETECTED NOT DETECTED Final   Candida tropicalis NOT DETECTED NOT DETECTED Final  Blood Culture (routine x 2)     Status: Abnormal   Collection Time: 08/03/16  8:20 AM  Result Value Ref Range Status   Specimen Description BLOOD RIGHT HAND  Final   Special Requests BOTTLES DRAWN AEROBIC ONLY  10CC  Final   Culture  Setup Time   Final    GRAM POSITIVE COCCI IN CLUSTERS AEROBIC BOTTLE ONLY CRITICAL RESULT CALLED TO, READ BACK BY AND VERIFIED WITH: K. HIATT, PHARM AT 2049  ON [id number] BY Lucienne Capers    Culture (A)  Final    STAPHYLOCOCCUS AUREUS SUSCEPTIBILITIES PERFORMED ON PREVIOUS CULTURE WITHIN THE LAST 5 DAYS.    Report Status 08/05/2016 FINAL  Final  MRSA PCR Screening     Status: None   Collection Time: 08/03/16  8:54 PM  Result Value Ref Range Status   MRSA by PCR NEGATIVE NEGATIVE Final    Comment:        The GeneXpert MRSA Assay (FDA approved for NASAL specimens only), is one component of a comprehensive MRSA colonization surveillance program. It is not intended to diagnose MRSA infection nor to guide or monitor treatment for MRSA infections.   Cath Tip Culture     Status: Abnormal (Preliminary result)   Collection Time: 08/04/16  9:23 AM  Result Value Ref Range Status   Specimen Description CATH TIP  Final   Special Requests NONE  Final   Culture (A)  Final    10,000 COLONIES/mL STAPHYLOCOCCUS AUREUS 20,000 COLONIES/mL STAPHYLOCOCCUS SPECIES (COAGULASE NEGATIVE)    Report Status PENDING  Incomplete  Culture, blood (routine x 2)     Status: Abnormal   Collection Time: 08/04/16 12:25 PM  Result Value Ref Range Status   Specimen Description BLOOD RIGHT HAND  Final   Special Requests IN PEDIATRIC BOTTLE 1.0CC  Final   Culture  Setup Time   Final    GRAM POSITIVE COCCI IN CLUSTERS IN PEDIATRIC BOTTLE CRITICAL VALUE NOTED.  VALUE IS CONSISTENT WITH PREVIOUSLY REPORTED AND CALLED VALUE.    Culture (A)  Final    STAPHYLOCOCCUS AUREUS SUSCEPTIBILITIES PERFORMED ON PREVIOUS CULTURE WITHIN THE LAST 5 DAYS.  Report Status 08/06/2016 FINAL  Final  Culture, blood (routine x 2)     Status: None (Preliminary result)   Collection Time: 08/04/16 12:28 PM  Result Value Ref Range Status   Specimen Description BLOOD RIGHT HAND  Final   Special Requests BOTTLES DRAWN AEROBIC ONLY 5CC  Final   Culture NO GROWTH 2 DAYS  Final   Report Status PENDING  Incomplete  Culture, blood (routine x 2)     Status: None (Preliminary result)   Collection  Time: 08/05/16  8:54 AM  Result Value Ref Range Status   Specimen Description BLOOD RIGHT ARM  Final   Special Requests BOTTLES DRAWN AEROBIC ONLY 5CC  Final   Culture NO GROWTH 1 DAY  Final   Report Status PENDING  Incomplete  Culture, blood (routine x 2)     Status: None (Preliminary result)   Collection Time: 08/05/16  8:54 AM  Result Value Ref Range Status   Specimen Description BLOOD RIGHT HAND  Final   Special Requests BOTTLES DRAWN AEROBIC ONLY 5CC  Final   Culture NO GROWTH 1 DAY  Final   Report Status PENDING  Incomplete     Scheduled Meds: . [MAR Hold] calcitRIOL  0.5 mcg Oral Q T,Th,Sa-HD  . [MAR Hold]  ceFAZolin (ANCEF) IV  2 g Intravenous Q T,Th,Sa-HD  . [MAR Hold] Darbepoetin Alfa  100 mcg Intravenous Q Thu-HD  . [MAR Hold] feeding supplement (PRO-STAT SUGAR FREE 64)  30 mL Oral BID  . fentaNYL      . [MAR Hold] gabapentin  300 mg Oral QHS  . [MAR Hold] heparin  5,000 Units Subcutaneous Q8H  . [MAR Hold] insulin aspart  0-5 Units Subcutaneous QHS  . [MAR Hold] insulin aspart  0-9 Units Subcutaneous TID WC  . [MAR Hold] insulin detemir  40 Units Subcutaneous QHS  . midazolam      . [MAR Hold] polyethylene glycol  17 g Oral Daily  . [MAR Hold] senna-docusate  1 tablet Oral BID   Continuous Infusions: . sodium chloride       LOS: 3 days   15 min  Pleas Koch, MD [facility] ([phone]   If 7PM-7AM, please contact night-coverage [email or web address] Password [email or web address] 08/06/2016, 3:35 PM

## 2016-08-06 NOTE — Procedures (Signed)
Tolerating HD.  S/P TEE and left IJ TDC well tolerated. Neg TEE for vegetations. No hemodynamic issues, BP ok, goal 4500 cc, BFR 400 cc/min.  James [name] C

## 2016-08-06 NOTE — Progress Notes (Signed)
Dialysis treatment completed.  4500 mL ultrafiltrated.  4000 mL net fluid removal.  Patient status unchanged. Lung sounds clear to ausculation in all fields. Generalized edema. Cardiac: NSR.  Cleansed LIJ catheter with chlorhexidine.  Disconnected lines and flushed ports with saline per protocol.  Ports locked with heparin and capped per protocol.    Report given to bedside, RN Charlett Nose.

## 2016-08-06 NOTE — Procedures (Signed)
LEFT IJ tunneled HD cathter placement with [place] and fluoroscopy No complication No blood loss. See complete dictation in [facility].

## 2016-08-06 NOTE — CV Procedure (Signed)
INDICATIONS:   The patient is 60 year old male has MSSA bacteremia treated with antibiotic. TEE is needed to r/o vegetations on valves.  PROCEDURE:  Informed consent was discussed including risks, benefits and alternatives for the procedure.  Risks include, but are not limited to, cough, sore throat, vomiting, nausea, somnolence, esophageal and stomach trauma or perforation, bleeding, low blood pressure, aspiration, pneumonia, infection, trauma to the teeth and death.    Patient was given sedation.  The oropharynx was anesthetized with topical lidocaine.  The transesophageal probe was inserted in the esophagus and stomach and multiple views were obtained.  Agitated saline was used after the transesophageal probe was removed from the body.  The patient was kept under observation until the patient left the procedure room.  The patient left the procedure room in stable condition.   COMPLICATIONS:  There were no immediate complications.  FINDINGS:  1. LEFT VENTRICLE: The left ventricle is normal in structure and function.  Wall motion is normal.  No thrombus or masses seen in the left ventricle.  2. RIGHT VENTRICLE:  The right ventricle is normal in structure and function without any thrombus or masses.    3. LEFT ATRIUM:  The left atrium is normal without any thrombus or masses.  4. LEFT ATRIAL APPENDAGE:  The left atrial appendage is free of any thrombus or masses.  5. RIGHT ATRIUM:  The right atrium is free of any thrombus or masses.    6. ATRIAL SEPTUM:  The atrial septum is normal without any ASD or PFO. Negative sonicated saline injection.  7. MITRAL VALVE:  The mitral valve is normal in structure and function with multiple jets of mild regurgitation, no masses, stenosis or vegetations.  8. TRICUSPID VALVE:  The tricuspid valve is normal in structure and function with mild regurgitation, no masses, stenosis or vegetations.  9. AORTIC VALVE:  The aortic valve is mildly thickened and  calcific without stenosis. No regurgitation, masses, stenosis or vegetations.   10. PULMONIC VALVE:  The pulmonic valve is normal in structure and function without regurgitation, masses, stenosis or vegetations.  11. AORTIC ARCH, ASCENDING AND DESCENDING AORTA:  The aorta had mild atherosclerosis in the ascending, descending and arch of aorta.    IMPRESSION:   1.  Preserved LV systolic function with EF 55-60 %. 2.  Mild MR and TR. 3.  No vegetations and no PFO.  RECOMMENDATIONS:    Medical therapy.

## 2016-08-07 LAB — BASIC METABOLIC PANEL
Anion gap: 11 (ref 5–15)
BUN: 37 mg/dL — AB (ref 6–20)
CHLORIDE: 95 mmol/L — AB (ref 101–111)
CO2: 25 mmol/L (ref 22–32)
CREATININE: 7.66 mg/dL — AB (ref 0.61–1.24)
Calcium: 8.1 mg/dL — ABNORMAL LOW (ref 8.9–10.3)
GFR calc Af Amer: 8 mL/min — ABNORMAL LOW (ref >60)
GFR calc non Af Amer: 7 mL/min — ABNORMAL LOW (ref >60)
GLUCOSE: 222 mg/dL — AB (ref 65–99)
Potassium: 3.7 mmol/L (ref 3.5–5.1)
SODIUM: 131 mmol/L — AB (ref 135–145)

## 2016-08-07 LAB — CBC
HCT: 31.7 % — ABNORMAL LOW (ref 39.0–52.0)
HEMOGLOBIN: 10 g/dL — AB (ref 13.0–17.0)
MCH: 28.3 pg (ref 26.0–34.0)
MCHC: 31.5 g/dL (ref 30.0–36.0)
MCV: 89.8 fL (ref 78.0–100.0)
PLATELETS: 203 10*3/uL (ref 150–400)
RBC: 3.53 MIL/uL — ABNORMAL LOW (ref 4.22–5.81)
RDW: 15.4 % (ref 11.5–15.5)
WBC: 7.4 10*3/uL (ref 4.0–10.5)

## 2016-08-07 LAB — CATH TIP CULTURE: Culture: 10000 — AB

## 2016-08-07 LAB — GLUCOSE, CAPILLARY
GLUCOSE-CAPILLARY: 190 mg/dL — AB (ref 65–99)
Glucose-Capillary: 200 mg/dL — ABNORMAL HIGH (ref 65–99)
Glucose-Capillary: 174 mg/dL — ABNORMAL HIGH (ref 65–99)
Glucose-Capillary: 211 mg/dL — ABNORMAL HIGH (ref 65–99)

## 2016-08-07 MED ORDER — CALCITRIOL 0.5 MCG PO CAPS
ORAL_CAPSULE | ORAL | Status: AC
  Administered 2016-08-07: 0.5 ug via ORAL
  Filled 2016-08-07: qty 1

## 2016-08-07 MED ORDER — CEFAZOLIN SODIUM-DEXTROSE 2-4 GM/100ML-% IV SOLN: 2.0000 g | INTRAVENOUS | 7 refills | Status: DC

## 2016-08-07 NOTE — Discharge Summary (Signed)
Physician Discharge Summary  James [name] WUJ:[id number] DOB: 10/17/1956 DOA: 08/03/2016  PCP: Frederica Kuster, MD  Admit date: 08/03/2016 Discharge date: 08/07/2016  Time spent: 30 minutes  Recommendations for Outpatient Follow-up:  1. Needs IV ancef with Dialysis tts until 08/19/16 and will be givne with dilaysis-nephrology already aware 2. Needs intermittent lab work as per nephro  Discharge Diagnoses:  Principal Problem:   Staphylococcus aureus bacteremia Active Problems:   HTN (hypertension)   DM (diabetes mellitus) (HCC)   ESRD on dialysis (HCC)   Anemia   Multiple myeloma (HCC)   Severe sepsis (HCC)   Anemia associated with chronic renal failure   Chronic systolic heart failure (HCC)   Hyponatremia   Elevated troponin   Discharge Condition: improved  Diet recommendation:  renal  Filed Weights   08/06/16 1654 08/06/16 2100 08/06/16 2123  Weight: 97.2 kg (214 lb 4.6 oz) 93.2 kg (205 lb 7.5 oz) 84.2 kg (185 lb 11.2 oz)    History of present illness:  60 y.o.? 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   In the ED he was found to have a temp of 103, HR 113, and RR 24 w/ WBC 15.3.  His RIJ TDC had been present since 04/04/2015.   This was removed on 08/04/2016 Blood cultures grew MSSA bacteremia and antibiotics were narrowed to cefazolin Repeat cultures performed 8/3o showed staphy Cult 8/31 so far have been clear of infection cardilogy consulted for TEE as below  See below  Hospital Course:  Sepsis due to Staph bacteremia (MSSA) - suspected HD cath infection  Antibiotics per ID  - no evidence on TTE of endocarditis  - TEE performed by Dr Algie Coffer no evidences of endocarditis -  rpt BC done 08/05/16 NGTD so far   White count 15--->9.7-->7.3 currently -will d/c home on Ancef with dialysis tts till 08/19/16  ESRD on T/Th/Sat  HD Nephrology following dialysis access currently out -L tunneled IJ HD cath placed 08/06/16 and has received dilaysis via this  Elevated troponin Suspect this is simply stress-related in setting of ESRD - - no chest pain  Chronic systolic congestive heart failure EF on TTE 60-65% -which is an improvement since his last echo in 2016  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, nephropathy  CBG 86-->220's  - continue to follow on supplemental coverage and transitioned to home regimen on d/c -continue Levemir 40 qhs as OP  Anemia of chronic kidney disease No evidence of acute blood loss  -hemoglobin is stable in 9-10 range  Hypertension with slight hypotension Blood pressure currently well controlled on imdur 60 Off amlodipine and metoprolol right now  Consultants:  Nephrology ID  Procedures: 8/30 TDC removal per IR  8/30 TTE - EF is 60-65% - wall motion normal - grade 1 diastolic dysfunction 08/06/16 TEE no vegetations  9/1 L Dialysis access placed in chest  Antimicrobials:  Zosyn 8/29  Vancomycin 8/29  Ceftaz 8/29 >9/2 Ancef IV till 9/14  Discharge Exam: Vitals:   08/07/16 0454 08/07/16 0730  BP: (!) 102/47 (!) 96/41  Pulse: 87 85  Resp: 19 20  Temp: 99.4 F (37.4 C) 99.9 F (37.7 C)   Alert pleasant had breakfast eating fair  No n/v No cp Walked in halls this am  General: Web designer, 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 Qty: 90 capsule, Refills: 0    Darbepoetin Alfa (ARANESP) 100 MCG/0.5ML SOSY injection Inject 0.5 mLs (100 mcg total) into the vein every Thursday with hemodialysis. Will be given at  dialysis.    gabapentin (NEURONTIN) 300 MG capsule Take 1 capsule (300 mg total) by mouth at bedtime. Qty: 30 capsule, Refills: 0    insulin aspart (NOVOLOG) 100 UNIT/ML injection Inject 6 Units into the skin 3 (three) times daily before meals. Qty: 3 vial, Refills: PRN    insulin detemir (LEVEMIR) 100 UNIT/ML injection Inject 40 Units into the skin at bedtime.     isosorbide mononitrate (IMDUR) 60 MG 24 hr tablet Take 1 tablet (60 mg total) by mouth daily. Qty: 30 tablet, Refills: 0    Vitamin D, Ergocalciferol, (DRISDOL) 50000 units CAPS capsule Take 50,000 Units by mouth every 7 (seven) days. Monday       No Known Allergies    The results of significant diagnostics from this hospitalization (including imaging, microbiology, ancillary and laboratory) are listed below for reference.    Significant Diagnostic Studies: Dg Chest 2 View  Result Date: 08/03/2016 CLINICAL DATA:  Chest pain, confusion, dialysis patient EXAM: CHEST  2 VIEW COMPARISON:  Chest x-ray of 09/15/2015 FINDINGS: No active infiltrate or effusion is seen. Mild linear atelectasis is noted at the lung bases. The heart is mildly enlarged and stable. Central venous catheter is unchanged in position. No bony abnormality is seen. IMPRESSION: Mild bibasilar linear atelectasis.  Stable mild cardiomegaly. Electronically Signed   By: Dwyane Dee M.D.   On: 08/03/2016 10:57   Ir Fluoro Guide Cv Line Left  Result Date: 08/06/2016 CLINICAL DATA:  Recent removal of infected right hemodialysis catheter. Negative blood cultures, on antibiotics. Durable venous access for hemodialysis is needed. EXAM: TUNNELED HEMODIALYSIS CATHETER PLACEMENT WITH ULTRASOUND AND FLUOROSCOPIC GUIDANCE TECHNIQUE: The procedure, risks, benefits, and alternatives were explained to the patient. Questions regarding the procedure were encouraged and answered. The patient understands and consents to the procedure. As antibiotic prophylaxis, cefazolin 2 g was  ordered pre-procedure and administered intravenously within one hour of incision.Patency of the left IJ vein was confirmed with ultrasound with image documentation. An appropriate skin site was determined. Region was prepped using maximum barrier technique including cap and mask, sterile gown, sterile gloves, large sterile sheet, and Chlorhexidine as cutaneous antisepsis. The region was infiltrated locally with 1% lidocaine. Intravenous Fentanyl and Versed were administered as conscious sedation during continuous monitoring of the patient's level of consciousness and physiological / cardiorespiratory status by the radiology RN, with a total moderate sedation time of 15 minutes. Under real-time ultrasound guidance, the left IJ vein was accessed with a 21 gauge micropuncture needle; the needle tip within the vein was confirmed with ultrasound image documentation. Needle exchanged over the 018 guidewire for transitional dilator, which allowed advancement of a Benson wire into the IVC. Over this, an MPA catheter was advanced. A Palindrome 23 hemodialysis catheter was tunneled from the left anterior chest wall approach to the left IJ dermatotomy site. The MPA catheter was exchanged over an Amplatz wire for serial vascular dilators which allow placement of a peel-away sheath, through which the  catheter was advanced under intermittent fluoroscopy, positioned with its tips in the proximal and midright atrium. Spot chest radiograph confirms good catheter position. No pneumothorax. Catheter was flushed and primed per protocol. Catheter secured externally with O Prolene sutures. The left IJ dermatotomy site was closed with Dermabond. COMPLICATIONS: COMPLICATIONS None immediate FLUOROSCOPY TIME:  0.9 minute, 112 uGym2 DAP COMPARISON:  None IMPRESSION: 1. Technically successful placement of tunneled left IJ hemodialysis catheter with ultrasound and fluoroscopic guidance. Ready for routine use. ACCESS: Remains approachable for  percutaneous intervention as needed. Electronically Signed   By: Corlis Leak M.D.   On: 08/06/2016 11:49   Ir Removal Tun Cv Cath W/o Fl  Result Date: 08/04/2016 INDICATION: Bacteremia. End-stage renal disease. Request removal of tunneled hemodialysis catheter. Catheter originally placed on 04/04/2015 by Dr. Bonnielee Haff. EXAM: REMOVAL TUNNELED CENTRAL VENOUS HEMODIALYSIS CATHETER MEDICATIONS: None; ANESTHESIA/SEDATION: Moderate Sedation Time:  None The patient's level of consciousness and vital signs were monitored continuously by radiology nursing throughout the procedure under my direct supervision. FLUOROSCOPY TIME:  Fluoroscopy Time: None COMPLICATIONS: None immediate. PROCEDURE: Informed written consent was obtained from the patient after a thorough discussion of the procedural risks, benefits and alternatives. All questions were addressed. Maximal Sterile Barrier Technique was utilized including caps, mask, sterile gowns, sterile gloves, sterile drape, hand hygiene and skin antiseptic. A timeout was performed prior to the initiation of the procedure. The patient's right chest and catheter was prepped and draped in a normal sterile fashion. Heparin was removed from both ports of catheter. 1% lidocaine was used for local anesthesia. Using gentle blunt dissection the cuff of the catheter was exposed and the catheter was removed in it's entirety. Pressure was held till hemostasis was obtained. A sterile dressing was applied. The patient tolerated the procedure well with no immediate complications. IMPRESSION: Successful tunneled hemodialysis catheter removal as described above. Read by: Brayton El PA-C Electronically Signed   By: Simonne Come M.D.   On: 08/04/2016 09:47   Ir US Guide Vasc Access Left  Result Date: 08/06/2016 CLINICAL DATA:  Recent removal of infected right hemodialysis catheter. Negative blood cultures, on antibiotics. Durable venous access for hemodialysis is needed. EXAM: TUNNELED HEMODIALYSIS  CATHETER PLACEMENT WITH ULTRASOUND AND FLUOROSCOPIC GUIDANCE TECHNIQUE: The procedure, risks, benefits, and alternatives were explained to the patient. Questions regarding the procedure were encouraged and answered. The patient understands and consents to the procedure. As antibiotic prophylaxis, cefazolin 2 g was ordered pre-procedure and administered intravenously within one hour of incision.Patency of the left IJ vein was confirmed with ultrasound with image documentation. An appropriate skin site was determined. Region was prepped using maximum barrier technique including cap and mask, sterile gown, sterile gloves, large sterile sheet, and Chlorhexidine as cutaneous antisepsis. The region was infiltrated locally with 1% lidocaine. Intravenous Fentanyl and Versed were administered as conscious sedation during continuous monitoring of the patient's level of consciousness and physiological / cardiorespiratory status by the radiology RN, with a total moderate sedation time of 15 minutes. Under real-time ultrasound guidance, the left IJ vein was accessed with a 21 gauge micropuncture needle; the needle tip within the vein was confirmed with ultrasound image documentation. Needle exchanged over the 018 guidewire for transitional dilator, which allowed advancement of a Benson wire into the IVC. Over this, an MPA catheter was advanced. A Palindrome 23 hemodialysis catheter was tunneled from the left anterior chest wall approach to the left IJ dermatotomy site. The MPA catheter was exchanged over an Amplatz wire for serial vascular dilators which  allow placement of a peel-away sheath, through which the catheter was advanced under intermittent fluoroscopy, positioned with its tips in the proximal and midright atrium. Spot chest radiograph confirms good catheter position. No pneumothorax. Catheter was flushed and primed per protocol. Catheter secured externally with O Prolene sutures. The left IJ dermatotomy site was  closed with Dermabond. COMPLICATIONS: COMPLICATIONS None immediate FLUOROSCOPY TIME:  0.9 minute, 112 uGym2 DAP COMPARISON:  None IMPRESSION: 1. Technically successful placement of tunneled left IJ hemodialysis catheter with ultrasound and fluoroscopic guidance. Ready for routine use. ACCESS: Remains approachable for percutaneous intervention as needed. Electronically Signed   By: Corlis Leak M.D.   On: 08/06/2016 11:49    Microbiology: Recent Results (from the past 240 hour(s))  Blood Culture (routine x 2)     Status: Abnormal   Collection Time: 08/03/16  8:15 AM  Result Value Ref Range Status   Specimen Description BLOOD RIGHT ANTECUBITAL  Final   Special Requests BOTTLES DRAWN AEROBIC AND ANAEROBIC  10CC  Final   Culture  Setup Time   Final    GRAM POSITIVE COCCI IN CLUSTERS IN BOTH AEROBIC AND ANAEROBIC BOTTLES CRITICAL RESULT CALLED TO, READ BACK BY AND VERIFIED WITH: K. HIATT, PHARM AT 2049 ON [id number] BY Lucienne Capers    Culture STAPHYLOCOCCUS AUREUS (A)  Final   Report Status 08/05/2016 FINAL  Final   Organism ID, Bacteria STAPHYLOCOCCUS AUREUS  Final      Susceptibility   Staphylococcus aureus - MIC*    CIPROFLOXACIN <=0.5 SENSITIVE Sensitive     ERYTHROMYCIN <=0.25 SENSITIVE Sensitive     GENTAMICIN <=0.5 SENSITIVE Sensitive     OXACILLIN 0.5 SENSITIVE Sensitive     TETRACYCLINE <=1 SENSITIVE Sensitive     VANCOMYCIN <=0.5 SENSITIVE Sensitive     TRIMETH/SULFA <=10 SENSITIVE Sensitive     CLINDAMYCIN <=0.25 SENSITIVE Sensitive     RIFAMPIN <=0.5 SENSITIVE Sensitive     Inducible Clindamycin NEGATIVE Sensitive     * STAPHYLOCOCCUS AUREUS  Blood Culture ID Panel (Reflexed)     Status: Abnormal   Collection Time: 08/03/16  8:15 AM  Result Value Ref Range Status   Enterococcus species NOT DETECTED NOT DETECTED Final   Listeria monocytogenes NOT DETECTED NOT DETECTED Final   Staphylococcus species DETECTED (A) NOT DETECTED Final    Comment: CRITICAL RESULT CALLED TO, READ BACK  BY AND VERIFIED WITH: K. HIATT, PHARM AT 2049 ON [id number] BY S. YARBROUGH    Staphylococcus aureus DETECTED (A) NOT DETECTED Final    Comment: CRITICAL RESULT CALLED TO, READ BACK BY AND VERIFIED WITH: K. HIATT, PHARM AT 2049 ON [id number] BY S. YARBROUGH    Methicillin resistance NOT DETECTED NOT DETECTED Final   Streptococcus species NOT DETECTED NOT DETECTED Final   Streptococcus agalactiae NOT DETECTED NOT DETECTED Final   Streptococcus pneumoniae NOT DETECTED NOT DETECTED Final   Streptococcus pyogenes NOT DETECTED NOT DETECTED Final   Acinetobacter baumannii NOT DETECTED NOT DETECTED Final   Enterobacteriaceae species NOT DETECTED NOT DETECTED Final   Enterobacter cloacae complex NOT DETECTED NOT DETECTED Final   Escherichia coli NOT DETECTED NOT DETECTED Final   Klebsiella oxytoca NOT DETECTED NOT DETECTED Final   Klebsiella pneumoniae NOT DETECTED NOT DETECTED Final   Proteus species NOT DETECTED NOT DETECTED Final   Serratia marcescens NOT DETECTED NOT DETECTED Final   Haemophilus influenzae NOT DETECTED NOT DETECTED Final   Neisseria meningitidis NOT DETECTED NOT DETECTED Final   Pseudomonas aeruginosa NOT DETECTED NOT DETECTED Final  Candida albicans NOT DETECTED NOT DETECTED Final   Candida glabrata NOT DETECTED NOT DETECTED Final   Candida krusei NOT DETECTED NOT DETECTED Final   Candida parapsilosis NOT DETECTED NOT DETECTED Final   Candida tropicalis NOT DETECTED NOT DETECTED Final  Blood Culture (routine x 2)     Status: Abnormal   Collection Time: 08/03/16  8:20 AM  Result Value Ref Range Status   Specimen Description BLOOD RIGHT HAND  Final   Special Requests BOTTLES DRAWN AEROBIC ONLY  10CC  Final   Culture  Setup Time   Final    GRAM POSITIVE COCCI IN CLUSTERS AEROBIC BOTTLE ONLY CRITICAL RESULT CALLED TO, READ BACK BY AND VERIFIED WITH: K. HIATT, PHARM AT 2049 ON [id number] BY Lucienne Capers    Culture (A)  Final    STAPHYLOCOCCUS AUREUS SUSCEPTIBILITIES PERFORMED ON  PREVIOUS CULTURE WITHIN THE LAST 5 DAYS.    Report Status 08/05/2016 FINAL  Final  MRSA PCR Screening     Status: None   Collection Time: 08/03/16  8:54 PM  Result Value Ref Range Status   MRSA by PCR NEGATIVE NEGATIVE Final    Comment:        The GeneXpert MRSA Assay (FDA approved for NASAL specimens only), is one component of a comprehensive MRSA colonization surveillance program. It is not intended to diagnose MRSA infection nor to guide or monitor treatment for MRSA infections.   Cath Tip Culture     Status: Abnormal (Preliminary result)   Collection Time: 08/04/16  9:23 AM  Result Value Ref Range Status   Specimen Description CATH TIP  Final   Special Requests NONE  Final   Culture (A)  Final    10,000 COLONIES/mL STAPHYLOCOCCUS AUREUS 20,000 COLONIES/mL STAPHYLOCOCCUS SPECIES (COAGULASE NEGATIVE)    Report Status PENDING  Incomplete  Culture, blood (routine x 2)     Status: Abnormal   Collection Time: 08/04/16 12:25 PM  Result Value Ref Range Status   Specimen Description BLOOD RIGHT HAND  Final   Special Requests IN PEDIATRIC BOTTLE 1.0CC  Final   Culture  Setup Time   Final    GRAM POSITIVE COCCI IN CLUSTERS IN PEDIATRIC BOTTLE CRITICAL VALUE NOTED.  VALUE IS CONSISTENT WITH PREVIOUSLY REPORTED AND CALLED VALUE.    Culture (A)  Final    STAPHYLOCOCCUS AUREUS SUSCEPTIBILITIES PERFORMED ON PREVIOUS CULTURE WITHIN THE LAST 5 DAYS.    Report Status 08/06/2016 FINAL  Final  Culture, blood (routine x 2)     Status: None (Preliminary result)   Collection Time: 08/04/16 12:28 PM  Result Value Ref Range Status   Specimen Description BLOOD RIGHT HAND  Final   Special Requests BOTTLES DRAWN AEROBIC ONLY 5CC  Final   Culture NO GROWTH 2 DAYS  Final   Report Status PENDING  Incomplete  Culture, blood (routine x 2)     Status: None (Preliminary result)   Collection Time: 08/05/16  8:54 AM  Result Value Ref Range Status   Specimen Description BLOOD RIGHT ARM  Final    Special Requests BOTTLES DRAWN AEROBIC ONLY 5CC  Final   Culture NO GROWTH 1 DAY  Final   Report Status PENDING  Incomplete  Culture, blood (routine x 2)     Status: None (Preliminary result)   Collection Time: 08/05/16  8:54 AM  Result Value Ref Range Status   Specimen Description BLOOD RIGHT HAND  Final   Special Requests BOTTLES DRAWN AEROBIC ONLY 5CC  Final   Culture NO GROWTH  1 DAY  Final   Report Status PENDING  Incomplete     Labs: Basic Metabolic Panel:  Recent Labs Lab 08/03/16 2024 08/04/16 0359 08/05/16 0427 08/06/16 1703 08/07/16 0344  NA 131* 132* 129* 128* 131*  K 3.3* 3.2* 3.1* 3.3* 3.7  CL 95* 96* 93* 94* 95*  CO2 24 28 25 24 25   GLUCOSE 190* 201* 134* 118* 222*  BUN 19 24* 41* 61* 37*  CREATININE 5.39* 6.78* 8.92* 11.17* 7.66*  CALCIUM 8.0* 8.0* 8.3* 8.3* 8.1*  PHOS 1.9*  --   --  3.6  --    Liver Function Tests:  Recent Labs Lab 08/03/16 2024 08/04/16 0359 08/05/16 0427 08/06/16 1703  AST  --  16 15  --   ALT  --  9* 9*  --   ALKPHOS  --  45 44  --   BILITOT  --  0.5 0.6  --   PROT  --  7.8 7.6  --   ALBUMIN 2.9* 2.6* 2.3* 2.5*   No results for input(s): LIPASE, AMYLASE in the last 168 hours. No results for input(s): AMMONIA in the last 168 hours. CBC:  Recent Labs Lab 08/03/16 0710  08/03/16 2024 08/04/16 0359 08/05/16 0427 08/06/16 0634 08/07/16 0344  WBC 15.3*  --  15.4* 15.0* 9.7 7.3 7.4  NEUTROABS 13.6*  --   --   --   --   --   --   HGB 10.6*  < > 11.2* 10.5* 9.8* 9.7* 10.0*  HCT 32.3*  < > 34.6* 32.1* 31.0* 30.6* 31.7*  MCV 88.0  --  89.4 88.7 89.6 89.2 89.8  PLT 217  --  189 186 181 207 203  < > = values in this interval not displayed. Cardiac Enzymes:  Recent Labs Lab 08/03/16 0818 08/05/16 0427  TROPONINI 0.06* 0.03*   BNP: BNP (last 3 results) No results for input(s): BNP in the last 8760 hours.  ProBNP (last 3 results) No results for input(s): PROBNP in the last 8760 hours.  CBG:  Recent Labs Lab  08/05/16 2119 08/06/16 1331 08/06/16 1634 08/06/16 2133 08/07/16 0734  GLUCAP 176* 89 91 151* 190*       Signed:  Rhetta Mura MD   [facility] 08/07/2016, 10:40 AM

## 2016-08-07 NOTE — Progress Notes (Signed)
Increased Arterial Pressures resulting in reversing the catheter to run tx; after 2 hours of tx APs started increasing leading to constant machine stoppages. Dialyzer clotted with 24 min left; tx stopped and terminated.

## 2016-08-07 NOTE — Progress Notes (Signed)
Spoke with Dr. Hollie Salk (nephrologist) about discharge home orders.  Per report HD catheter functioning poorly during treatment and patient was unable to finish HD treatment; he did not receive scheduled Ancef with HD tx.  Dr Hollie Salk changed discharge orders; patient will not be leaving today.  HD catheter will be addressed tomorrow 08/08/2016 per Dr. Hollie Salk.  Order to give Ancef IV peripheral tonight at 2000.  M. Donnal Debar, NP notified.

## 2016-08-07 NOTE — Progress Notes (Signed)
[facility] Progress Note  Assessment/Plan: 1. MSSA Bacteremia /TDC infection:Per primary.  Changed to Ancef  IR removed [facility] 8/30 cath tip coag neg staph and staph aureus  MSSA detected in initial blood cx. repeat cultures 1/2 8/30 staph 2/2 8/31 pending TEE neg for vegetations EF 50 - 55%. Catheter replaced 9/1 by Dr. Doreatha Lew can be given at the outpt HD unit after discharge. 2. ESRD- T,TH,S [place]. Had HD Friday after cath was replaced - will run a short course HD today to get back on schedule NA 131 K 3.7 3. Hypertension/volume- Off hydralazine/Metoprolol for hypertension. BP on the low side this am Net UF 4 L on Friday with post weight 93.2 4. Anemia- HGB 10 stable - not due for ESA 5. Metabolic bone disease- continueVDRA. Hold binders Phos -up to 3.6 off binders 6. Nutrition- Albumin 2.3 Regulardiet/renal vit/nepro 7. CHF: Per primary. On beta blocker/isosorbide mononitrate  8. DM: Per primary  Myriam Jacobson, PA-C [place] [phone] 08/07/2016,8:16 AM  LOS: 4 days   Renal Attending: He is better from his infection and will get scheduled HD today.  Possible discharge today and will need antibiotics at [facility]. Leonardo Makris C  Subjective:   No c/o  Objective Vitals:   08/06/16 2103 08/06/16 2123 08/07/16 0454 08/07/16 0730  BP: 118/71 121/87 (!) 102/47 (!) 96/41  Pulse: 86 97 87 85  Resp:  20 19 20   Temp:  98.9 F (37.2 C) 99.4 F (37.4 C) 99.9 F (37.7 C)  TempSrc:  Oral Oral Oral  SpO2:  99% 97% 94%  Weight:  84.2 kg (185 lb 11.2 oz)    Height:       Physical Exam General: NAD Heart: RRR Lungs: no rales Abdomen: soft NT Extremities: tr LE edema Dialysis Access: new left IJ and left upper AVF + bruit (placed 06/07/16)  [facility] 400/Auto 2.0 EDW 91 kg 2.0K/2.25 Ca Heparin 8000 units IV q treatment Mircera 75 mcg IV q 4 weeks (Last dose 07/27/16 Last  in-center HGB 11.0, Fe 53 Tsat 25%) Calcitriol 0.5 mcg PO q tx (Last PTH 397 06/24/16):   Additional Objective Labs: Basic Metabolic Panel:  Recent Labs Lab 08/03/16 2024  08/05/16 0427 08/06/16 1703 08/07/16 0344  NA 131*  < > 129* 128* 131*  K 3.3*  < > 3.1* 3.3* 3.7  CL 95*  < > 93* 94* 95*  CO2 24  < > 25 24 25   GLUCOSE 190*  < > 134* 118* 222*  BUN 19  < > 41* 61* 37*  CREATININE 5.39*  < > 8.92* 11.17* 7.66*  CALCIUM 8.0*  < > 8.3* 8.3* 8.1*  PHOS 1.9*  --   --  3.6  --   < > = values in this interval not displayed. Liver Function Tests:  Recent Labs Lab 08/04/16 0359 08/05/16 0427 08/06/16 1703  AST 16 15  --   ALT 9* 9*  --   ALKPHOS 45 44  --   BILITOT 0.5 0.6  --   PROT 7.8 7.6  --   ALBUMIN 2.6* 2.3* 2.5*  CBC:  Recent Labs Lab 08/03/16 0710  08/03/16 2024 08/04/16 0359 08/05/16 0427 08/06/16 0634 08/07/16 0344  WBC 15.3*  --  15.4* 15.0* 9.7 7.3 7.4  NEUTROABS 13.6*  --   --   --   --   --   --   HGB 10.6*  < > 11.2* 10.5* 9.8*  9.7* 10.0*  HCT 32.3*  < > 34.6* 32.1* 31.0* 30.6* 31.7*  MCV 88.0  --  89.4 88.7 89.6 89.2 89.8  PLT 217  --  189 186 181 207 203  < > = values in this interval not displayed. Blood Culture    Component Value Date/Time   SDES BLOOD RIGHT ARM 08/05/2016 0854   SDES BLOOD RIGHT HAND 08/05/2016 0854   SPECREQUEST BOTTLES DRAWN AEROBIC ONLY 5CC 08/05/2016 0854   SPECREQUEST BOTTLES DRAWN AEROBIC ONLY 5CC 08/05/2016 0854   CULT NO GROWTH 1 DAY 08/05/2016 0854   CULT NO GROWTH 1 DAY 08/05/2016 0854   REPTSTATUS PENDING 08/05/2016 0854   REPTSTATUS PENDING 08/05/2016 0854    Cardiac Enzymes:  Recent Labs Lab 08/03/16 0818 08/05/16 0427  TROPONINI 0.06* 0.03*   CBG:  Recent Labs Lab 08/05/16 2119 08/06/16 1331 08/06/16 1634 08/06/16 2133 08/07/16 0734  GLUCAP 176* 89 91 151* 190*    Lab Results  Component Value Date   INR 1.41 08/03/2016   INR 1.43 08/03/2016   INR 1.21 04/02/2015    Studies/Results: Ir Fluoro Guide Cv Line Left  Result Date: 08/06/2016 CLINICAL DATA:  Recent removal of infected right hemodialysis catheter. Negative blood cultures, on antibiotics. Durable venous access for hemodialysis is needed. EXAM: TUNNELED HEMODIALYSIS CATHETER PLACEMENT WITH ULTRASOUND AND FLUOROSCOPIC GUIDANCE TECHNIQUE: The procedure, risks, benefits, and alternatives were explained to the patient. Questions regarding the procedure were encouraged and answered. The patient understands and consents to the procedure. As antibiotic prophylaxis, cefazolin 2 g was ordered pre-procedure and administered intravenously within one hour of incision.Patency of the left IJ vein was confirmed with ultrasound with image documentation. An appropriate skin site was determined. Region was prepped using maximum barrier technique including cap and mask, sterile gown, sterile gloves, large sterile sheet, and Chlorhexidine as cutaneous antisepsis. The region was infiltrated locally with 1% lidocaine. Intravenous Fentanyl and Versed were administered as conscious sedation during continuous monitoring of the patient's level of consciousness and physiological / cardiorespiratory status by the radiology RN, with a total moderate sedation time of 15 minutes. Under real-time ultrasound guidance, the left IJ vein was accessed with a 21 gauge micropuncture needle; the needle tip within the vein was confirmed with ultrasound image documentation. Needle exchanged over the 018 guidewire for transitional dilator, which allowed advancement of a Benson wire into the IVC. Over this, an MPA catheter was advanced. A Palindrome 23 hemodialysis catheter was tunneled from the left anterior chest wall approach to the left IJ dermatotomy site. The MPA catheter was exchanged over an Amplatz wire for serial vascular dilators which allow placement of a peel-away sheath, through which the catheter was advanced under intermittent fluoroscopy,  positioned with its tips in the proximal and midright atrium. Spot chest radiograph confirms good catheter position. No pneumothorax. Catheter was flushed and primed per protocol. Catheter secured externally with O Prolene sutures. The left IJ dermatotomy site was closed with Dermabond. COMPLICATIONS: COMPLICATIONS None immediate FLUOROSCOPY TIME:  0.9 minute, 112 uGym2 DAP COMPARISON:  None IMPRESSION: 1. Technically successful placement of tunneled left IJ hemodialysis catheter with ultrasound and fluoroscopic guidance. Ready for routine use. ACCESS: Remains approachable for percutaneous intervention as needed. Electronically Signed   By: Lucrezia Europe M.D.   On: 08/06/2016 11:49   Ir US Guide Vasc Access Left  Result Date: 08/06/2016 CLINICAL DATA:  Recent removal of infected right hemodialysis catheter. Negative blood cultures, on antibiotics. Durable venous access for hemodialysis is needed. EXAM: TUNNELED HEMODIALYSIS  CATHETER PLACEMENT WITH ULTRASOUND AND FLUOROSCOPIC GUIDANCE TECHNIQUE: The procedure, risks, benefits, and alternatives were explained to the patient. Questions regarding the procedure were encouraged and answered. The patient understands and consents to the procedure. As antibiotic prophylaxis, cefazolin 2 g was ordered pre-procedure and administered intravenously within one hour of incision.Patency of the left IJ vein was confirmed with ultrasound with image documentation. An appropriate skin site was determined. Region was prepped using maximum barrier technique including cap and mask, sterile gown, sterile gloves, large sterile sheet, and Chlorhexidine as cutaneous antisepsis. The region was infiltrated locally with 1% lidocaine. Intravenous Fentanyl and Versed were administered as conscious sedation during continuous monitoring of the patient's level of consciousness and physiological / cardiorespiratory status by the radiology RN, with a total moderate sedation time of 15 minutes. Under  real-time ultrasound guidance, the left IJ vein was accessed with a 21 gauge micropuncture needle; the needle tip within the vein was confirmed with ultrasound image documentation. Needle exchanged over the 018 guidewire for transitional dilator, which allowed advancement of a Benson wire into the IVC. Over this, an MPA catheter was advanced. A Palindrome 23 hemodialysis catheter was tunneled from the left anterior chest wall approach to the left IJ dermatotomy site. The MPA catheter was exchanged over an Amplatz wire for serial vascular dilators which allow placement of a peel-away sheath, through which the catheter was advanced under intermittent fluoroscopy, positioned with its tips in the proximal and midright atrium. Spot chest radiograph confirms good catheter position. No pneumothorax. Catheter was flushed and primed per protocol. Catheter secured externally with O Prolene sutures. The left IJ dermatotomy site was closed with Dermabond. COMPLICATIONS: COMPLICATIONS None immediate FLUOROSCOPY TIME:  0.9 minute, 112 uGym2 DAP COMPARISON:  None IMPRESSION: 1. Technically successful placement of tunneled left IJ hemodialysis catheter with ultrasound and fluoroscopic guidance. Ready for routine use. ACCESS: Remains approachable for percutaneous intervention as needed. Electronically Signed   By: Lucrezia Europe M.D.   On: 08/06/2016 11:49   Medications:   . calcitRIOL  0.5 mcg Oral Q T,Th,Sa-HD  .  ceFAZolin (ANCEF) IV  2 g Intravenous Q T,Th,Sa-HD  . Darbepoetin Alfa  100 mcg Intravenous Q Thu-HD  . feeding supplement (PRO-STAT SUGAR FREE 64)  30 mL Oral BID  . gabapentin  300 mg Oral QHS  . heparin  5,000 Units Subcutaneous Q8H  . insulin aspart  0-5 Units Subcutaneous QHS  . insulin aspart  0-9 Units Subcutaneous TID WC  . insulin detemir  40 Units Subcutaneous QHS  . polyethylene glycol  17 g Oral Daily  . senna-docusate  1 tablet Oral BID

## 2016-08-08 ENCOUNTER — Encounter (HOSPITAL_COMMUNITY): Payer: Self-pay | Admitting: Cardiovascular Disease

## 2016-08-08 LAB — GLUCOSE, CAPILLARY
GLUCOSE-CAPILLARY: 170 mg/dL — AB (ref 65–99)
GLUCOSE-CAPILLARY: 185 mg/dL — AB (ref 65–99)
Glucose-Capillary: 94 mg/dL (ref 65–99)

## 2016-08-08 MED ORDER — ALTEPLASE 2 MG IJ SOLR
2.0000 mg | Freq: Once | INTRAMUSCULAR | Status: AC
Start: 2016-08-08 — End: 2016-08-08
  Administered 2016-08-08: 2 mg

## 2016-08-08 MED ORDER — ALTEPLASE 2 MG IJ SOLR
2.0000 mg | Freq: Once | INTRAMUSCULAR | Status: AC
  Administered 2016-08-08: 2 mg
  Filled 2016-08-08: qty 2

## 2016-08-08 MED ORDER — RENA-VITE PO TABS: 1.0000 | ORAL_TABLET | Freq: Every day | ORAL | Status: DC

## 2016-08-08 NOTE — Progress Notes (Signed)
[facility] Progress Note   Interval History : discharge postponed due to catheter malfunction.  Had high arterial pressures.  Dialysis stopped prematurely. Ancef given peripherally after HD. BFR were recorded as 300 on Friday and 250 on Saturday  Dialysis Orders: [facility], T,TH,S 4 hours 400/Auto 2.0 EDW 91 kg 2.0K/2.25 Ca Heparin 8000 units IV q treatment Mircera 75 mcg IV q 4 weeks (Last dose 07/27/16 Last in-center HGB 11.0, Fe 53 Tsat 25%) Calcitriol 0.5 mcg PO q tx (Last PTH 397 06/24/16):   Assessment/Plan: 1. MSSA Bacteremia /TDC infection:Changed to Ancef IR removed TDC8/30 cath tip coag neg staph and staph aureus MSSA detected in initial blood cx. repeat cultures 1/2 8/30 staph 2/2 8/31 pending TEE neg for vegetations EF 50 - 55%. Catheter replaced 9/1 by Dr. Vernard Gambles. Discussed with Dr. Kathlene Cote who felt the placement was good on the CXR. Plan cathflo both ports today and then discharge home to follow up with dialysis on Tuesday.  If cath continues to malfunction, it will need to be replaced as an outpatient.  Ancef can be given at the outpt HD unit after discharge. 2. ESRD- T,TH,S [place]. Had HD Friday after cath was replaced -had short course Sat to get back on schedule. Next HD Tuesday 3. Hypertension/volume- Off hydralazine/Metoprolol for hypertension. BP on the low side this am Net UF 4 L on Friday with post weight 93.2. Net UF Sat 2.1 L with post weight 90.7 4. Anemia- HGB 10 stable - not due for ESA 5. Metabolic bone disease- continueVDRA. Hold binders Phos -up to 3.6 off binders 6. Nutrition- Albumin 2.3 Regulardiet/renal vit/nepro 7. CHF: Per primary. On beta blocker/isosorbide mononitrate  8. DM: Per primary  Myriam Jacobson, PA-C [place] [phone] 08/08/2016,8:46 AM  LOS: 5 days    Renal Attending: I agree with evaluation and plan as outline above. Kaleah Hagemeister C  Subjective:   No  c/o.  Waiting on getting problem with cath  Objective Vitals:   08/07/16 1521 08/07/16 1827 08/07/16 2010 08/08/16 0444  BP: (!) 104/52 (!) 95/37 120/76 (!) 101/48  Pulse: 86 86 90 85  Resp: 20 18 20 19   Temp: 98.5 F (36.9 C) 98.3 F (36.8 C) 99.5 F (37.5 C) 99.5 F (37.5 C)  TempSrc: Oral Oral Oral Oral  SpO2: 94% 100% 100% 96%  Weight: 90.7 kg (199 lb 15.3 oz)  94.3 kg (207 lb 14.4 oz)   Height:       Physical Exam General: Heart: Lungs: Abdomen: Extremities: Dialysis Access:    Additional Objective Labs: Basic Metabolic Panel:  Recent Labs Lab 08/03/16 2024  08/05/16 0427 08/06/16 1703 08/07/16 0344  NA 131*  < > 129* 128* 131*  K 3.3*  < > 3.1* 3.3* 3.7  CL 95*  < > 93* 94* 95*  CO2 24  < > 25 24 25   GLUCOSE 190*  < > 134* 118* 222*  BUN 19  < > 41* 61* 37*  CREATININE 5.39*  < > 8.92* 11.17* 7.66*  CALCIUM 8.0*  < > 8.3* 8.3* 8.1*  PHOS 1.9*  --   --  3.6  --   < > = values in this interval not displayed. Liver Function Tests:  Recent Labs Lab 08/04/16 0359 08/05/16 0427 08/06/16 1703  AST 16 15  --   ALT 9* 9*  --   ALKPHOS 45 44  --   BILITOT 0.5 0.6  --   PROT 7.8 7.6  --  ALBUMIN 2.6* 2.3* 2.5*   No results for input(s): LIPASE, AMYLASE in the last 168 hours. CBC:  Recent Labs Lab 08/03/16 0710  08/03/16 2024 08/04/16 0359 08/05/16 0427 08/06/16 0634 08/07/16 0344  WBC 15.3*  --  15.4* 15.0* 9.7 7.3 7.4  NEUTROABS 13.6*  --   --   --   --   --   --   HGB 10.6*  < > 11.2* 10.5* 9.8* 9.7* 10.0*  HCT 32.3*  < > 34.6* 32.1* 31.0* 30.6* 31.7*  MCV 88.0  --  89.4 88.7 89.6 89.2 89.8  PLT 217  --  189 186 181 207 203  < > = values in this interval not displayed. Blood Culture    Component Value Date/Time   SDES BLOOD RIGHT ARM 08/05/2016 0854   SDES BLOOD RIGHT HAND 08/05/2016 0854   SPECREQUEST BOTTLES DRAWN AEROBIC ONLY 5CC 08/05/2016 0854   SPECREQUEST BOTTLES DRAWN AEROBIC ONLY 5CC 08/05/2016 0854   CULT NO GROWTH 2 DAYS  08/05/2016 0854   CULT NO GROWTH 2 DAYS 08/05/2016 0854   REPTSTATUS PENDING 08/05/2016 0854   REPTSTATUS PENDING 08/05/2016 0854    Cardiac Enzymes:  Recent Labs Lab 08/03/16 0818 08/05/16 0427  TROPONINI 0.06* 0.03*   CBG:  Recent Labs Lab 08/07/16 0734 08/07/16 1134 08/07/16 1712 08/07/16 2042 08/08/16 0740  GLUCAP 190* 200* 174* 211* 94   Iron Studies: No results for input(s): IRON, TIBC, TRANSFERRIN, FERRITIN in the last 72 hours. Lab Results  Component Value Date   INR 1.41 08/03/2016   INR 1.43 08/03/2016   INR 1.21 04/02/2015   Studies/Results: Ir Fluoro Guide Cv Line Left  Result Date: 08/06/2016 CLINICAL DATA:  Recent removal of infected right hemodialysis catheter. Negative blood cultures, on antibiotics. Durable venous access for hemodialysis is needed. EXAM: TUNNELED HEMODIALYSIS CATHETER PLACEMENT WITH ULTRASOUND AND FLUOROSCOPIC GUIDANCE TECHNIQUE: The procedure, risks, benefits, and alternatives were explained to the patient. Questions regarding the procedure were encouraged and answered. The patient understands and consents to the procedure. As antibiotic prophylaxis, cefazolin 2 g was ordered pre-procedure and administered intravenously within one hour of incision.Patency of the left IJ vein was confirmed with ultrasound with image documentation. An appropriate skin site was determined. Region was prepped using maximum barrier technique including cap and mask, sterile gown, sterile gloves, large sterile sheet, and Chlorhexidine as cutaneous antisepsis. The region was infiltrated locally with 1% lidocaine. Intravenous Fentanyl and Versed were administered as conscious sedation during continuous monitoring of the patient's level of consciousness and physiological / cardiorespiratory status by the radiology RN, with a total moderate sedation time of 15 minutes. Under real-time ultrasound guidance, the left IJ vein was accessed with a 21 gauge micropuncture needle; the  needle tip within the vein was confirmed with ultrasound image documentation. Needle exchanged over the 018 guidewire for transitional dilator, which allowed advancement of a Benson wire into the IVC. Over this, an MPA catheter was advanced. A Palindrome 23 hemodialysis catheter was tunneled from the left anterior chest wall approach to the left IJ dermatotomy site. The MPA catheter was exchanged over an Amplatz wire for serial vascular dilators which allow placement of a peel-away sheath, through which the catheter was advanced under intermittent fluoroscopy, positioned with its tips in the proximal and midright atrium. Spot chest radiograph confirms good catheter position. No pneumothorax. Catheter was flushed and primed per protocol. Catheter secured externally with O Prolene sutures. The left IJ dermatotomy site was closed with Dermabond. COMPLICATIONS: COMPLICATIONS None immediate  FLUOROSCOPY TIME:  0.9 minute, 112 uGym2 DAP COMPARISON:  None IMPRESSION: 1. Technically successful placement of tunneled left IJ hemodialysis catheter with ultrasound and fluoroscopic guidance. Ready for routine use. ACCESS: Remains approachable for percutaneous intervention as needed. Electronically Signed   By: Lucrezia Europe M.D.   On: 08/06/2016 11:49   Ir US Guide Vasc Access Left  Result Date: 08/06/2016 CLINICAL DATA:  Recent removal of infected right hemodialysis catheter. Negative blood cultures, on antibiotics. Durable venous access for hemodialysis is needed. EXAM: TUNNELED HEMODIALYSIS CATHETER PLACEMENT WITH ULTRASOUND AND FLUOROSCOPIC GUIDANCE TECHNIQUE: The procedure, risks, benefits, and alternatives were explained to the patient. Questions regarding the procedure were encouraged and answered. The patient understands and consents to the procedure. As antibiotic prophylaxis, cefazolin 2 g was ordered pre-procedure and administered intravenously within one hour of incision.Patency of the left IJ vein was confirmed with  ultrasound with image documentation. An appropriate skin site was determined. Region was prepped using maximum barrier technique including cap and mask, sterile gown, sterile gloves, large sterile sheet, and Chlorhexidine as cutaneous antisepsis. The region was infiltrated locally with 1% lidocaine. Intravenous Fentanyl and Versed were administered as conscious sedation during continuous monitoring of the patient's level of consciousness and physiological / cardiorespiratory status by the radiology RN, with a total moderate sedation time of 15 minutes. Under real-time ultrasound guidance, the left IJ vein was accessed with a 21 gauge micropuncture needle; the needle tip within the vein was confirmed with ultrasound image documentation. Needle exchanged over the 018 guidewire for transitional dilator, which allowed advancement of a Benson wire into the IVC. Over this, an MPA catheter was advanced. A Palindrome 23 hemodialysis catheter was tunneled from the left anterior chest wall approach to the left IJ dermatotomy site. The MPA catheter was exchanged over an Amplatz wire for serial vascular dilators which allow placement of a peel-away sheath, through which the catheter was advanced under intermittent fluoroscopy, positioned with its tips in the proximal and midright atrium. Spot chest radiograph confirms good catheter position. No pneumothorax. Catheter was flushed and primed per protocol. Catheter secured externally with O Prolene sutures. The left IJ dermatotomy site was closed with Dermabond. COMPLICATIONS: COMPLICATIONS None immediate FLUOROSCOPY TIME:  0.9 minute, 112 uGym2 DAP COMPARISON:  None IMPRESSION: 1. Technically successful placement of tunneled left IJ hemodialysis catheter with ultrasound and fluoroscopic guidance. Ready for routine use. ACCESS: Remains approachable for percutaneous intervention as needed. Electronically Signed   By: Lucrezia Europe M.D.   On: 08/06/2016 11:49   Medications:   .  calcitRIOL  0.5 mcg Oral Q T,Th,Sa-HD  .  ceFAZolin (ANCEF) IV  2 g Intravenous Q T,Th,Sa-HD  . Darbepoetin Alfa  100 mcg Intravenous Q Thu-HD  . feeding supplement (PRO-STAT SUGAR FREE 64)  30 mL Oral BID  . gabapentin  300 mg Oral QHS  . heparin  5,000 Units Subcutaneous Q8H  . insulin aspart  0-5 Units Subcutaneous QHS  . insulin aspart  0-9 Units Subcutaneous TID WC  . insulin detemir  40 Units Subcutaneous QHS  . polyethylene glycol  17 g Oral Daily  . senna-docusate  1 tablet Oral BID

## 2016-08-08 NOTE — Progress Notes (Signed)
Per IV team nurse, cathflo effective for HD catheter and now has good flow.  Nephrology notified; Dr. Hollie Salk in agreement with discharge.  Dr. Verlon Au notified and verbal order received to discharge patient.

## 2016-08-08 NOTE — Progress Notes (Signed)
   Note review and patient interviewed Discharge delayed secondary to hemodialysis catheter malfunction which will be TPA later today Patient has no specific complaints Blood pressures are low normal with low grade temps but he is being treated for endocarditis and will receive antibiotics with next dialysis  Stable for discharge from my perspective no changes made to discharge summary dated 08/07/2016

## 2016-08-08 NOTE — Progress Notes (Signed)
Pharmacy Antibiotic Note  James [name] is a 60 y.o. male admitted on 08/03/2016 with sepsis.  Found to have MSSA bacteremia.   On cefazolin for MSSA bacteremia. Sepsis suspected secondary to R chest wall tunneled cath - removed 8/30. Neg TEE for vegetations. Repeat cx's on 8/31 remain negative so far.   Plan: Continue cefazolin 2g IV QHD-TTS Monitor clinical picture, renal function Continue cefazolin through 9/13 as long as repeat cx's remain negative   Height: 5\' 5"  (165.1 cm) Weight: 207 lb 14.4 oz (94.3 kg) IBW/kg (Calculated) : 61.5  Temp (24hrs), Avg:99 F (37.2 C), Min:98.3 F (36.8 C), Max:99.5 F (37.5 C)   Recent Labs Lab 08/03/16 0835 08/03/16 1157 08/03/16 1159 08/03/16 2024 08/04/16 0359 08/05/16 0427 08/06/16 0634 08/06/16 1703 08/07/16 0344  WBC  --   --   --  15.4* 15.0* 9.7 7.3  --  7.4  CREATININE  --   --   --  5.39* 6.78* 8.92*  --  11.17* 7.66*  LATICACIDVEN 1.65 1.06 1.2  --   --   --   --   --   --     Estimated Creatinine Clearance: 11.1 mL/min (by C-G formula based on SCr of 7.66 mg/dL).    No Known Allergies  Antimicrobials this admission: Vanc 8/29 >> 8/29 (received 1 dose 8/29 @ 09:20) Zosyn 8/29 >> 8/29 Ancef 8/29>  Dose adjustments this admission: n/a  Microbiology results: 8/29 BCx: MSSA 8/29 Cath tip: coag neg staph 8/29 MRSA PCR: neg 8/30 BCx: 1/2 MSSA 8/31 BCx: ngtd  Thank you for allowing pharmacy to be a part of this patient's care  Elenor Quinones, PharmD, BCPS Clinical Pharmacist Pager [phone] 08/08/2016 1:27 PM

## 2016-08-08 NOTE — Progress Notes (Signed)
Discharge summary and medications reviewed with questions answered.

## 2016-08-09 LAB — CULTURE, BLOOD (ROUTINE X 2): CULTURE: NO GROWTH

## 2016-08-10 DIAGNOSIS — N2581 Secondary hyperparathyroidism of renal origin: Secondary | ICD-10-CM | POA: Diagnosis not present

## 2016-08-10 DIAGNOSIS — D631 Anemia in chronic kidney disease: Secondary | ICD-10-CM | POA: Diagnosis not present

## 2016-08-10 DIAGNOSIS — N186 End stage renal disease: Secondary | ICD-10-CM | POA: Diagnosis not present

## 2016-08-10 DIAGNOSIS — Z23 Encounter for immunization: Secondary | ICD-10-CM | POA: Diagnosis not present

## 2016-08-10 DIAGNOSIS — E118 Type 2 diabetes mellitus with unspecified complications: Secondary | ICD-10-CM | POA: Diagnosis not present

## 2016-08-10 DIAGNOSIS — A4901 Methicillin susceptible Staphylococcus aureus infection, unspecified site: Secondary | ICD-10-CM | POA: Diagnosis not present

## 2016-08-10 LAB — CULTURE, BLOOD (ROUTINE X 2)
CULTURE: NO GROWTH
Culture: NO GROWTH

## 2016-08-12 DIAGNOSIS — Z23 Encounter for immunization: Secondary | ICD-10-CM | POA: Diagnosis not present

## 2016-08-12 DIAGNOSIS — N2581 Secondary hyperparathyroidism of renal origin: Secondary | ICD-10-CM | POA: Diagnosis not present

## 2016-08-12 DIAGNOSIS — A4901 Methicillin susceptible Staphylococcus aureus infection, unspecified site: Secondary | ICD-10-CM | POA: Diagnosis not present

## 2016-08-12 DIAGNOSIS — E118 Type 2 diabetes mellitus with unspecified complications: Secondary | ICD-10-CM | POA: Diagnosis not present

## 2016-08-12 DIAGNOSIS — D631 Anemia in chronic kidney disease: Secondary | ICD-10-CM | POA: Diagnosis not present

## 2016-08-12 DIAGNOSIS — N186 End stage renal disease: Secondary | ICD-10-CM | POA: Diagnosis not present

## 2016-08-14 DIAGNOSIS — N2581 Secondary hyperparathyroidism of renal origin: Secondary | ICD-10-CM | POA: Diagnosis not present

## 2016-08-14 DIAGNOSIS — D631 Anemia in chronic kidney disease: Secondary | ICD-10-CM | POA: Diagnosis not present

## 2016-08-14 DIAGNOSIS — Z23 Encounter for immunization: Secondary | ICD-10-CM | POA: Diagnosis not present

## 2016-08-14 DIAGNOSIS — N186 End stage renal disease: Secondary | ICD-10-CM | POA: Diagnosis not present

## 2016-08-14 DIAGNOSIS — A4901 Methicillin susceptible Staphylococcus aureus infection, unspecified site: Secondary | ICD-10-CM | POA: Diagnosis not present

## 2016-08-14 DIAGNOSIS — E118 Type 2 diabetes mellitus with unspecified complications: Secondary | ICD-10-CM | POA: Diagnosis not present

## 2016-08-17 DIAGNOSIS — N2581 Secondary hyperparathyroidism of renal origin: Secondary | ICD-10-CM | POA: Diagnosis not present

## 2016-08-17 DIAGNOSIS — A4901 Methicillin susceptible Staphylococcus aureus infection, unspecified site: Secondary | ICD-10-CM | POA: Diagnosis not present

## 2016-08-17 DIAGNOSIS — D631 Anemia in chronic kidney disease: Secondary | ICD-10-CM | POA: Diagnosis not present

## 2016-08-17 DIAGNOSIS — Z23 Encounter for immunization: Secondary | ICD-10-CM | POA: Diagnosis not present

## 2016-08-17 DIAGNOSIS — N186 End stage renal disease: Secondary | ICD-10-CM | POA: Diagnosis not present

## 2016-08-17 DIAGNOSIS — E118 Type 2 diabetes mellitus with unspecified complications: Secondary | ICD-10-CM | POA: Diagnosis not present

## 2016-08-19 DIAGNOSIS — N186 End stage renal disease: Secondary | ICD-10-CM | POA: Diagnosis not present

## 2016-08-19 DIAGNOSIS — N2581 Secondary hyperparathyroidism of renal origin: Secondary | ICD-10-CM | POA: Diagnosis not present

## 2016-08-19 DIAGNOSIS — D631 Anemia in chronic kidney disease: Secondary | ICD-10-CM | POA: Diagnosis not present

## 2016-08-19 DIAGNOSIS — A4901 Methicillin susceptible Staphylococcus aureus infection, unspecified site: Secondary | ICD-10-CM | POA: Diagnosis not present

## 2016-08-19 DIAGNOSIS — E118 Type 2 diabetes mellitus with unspecified complications: Secondary | ICD-10-CM | POA: Diagnosis not present

## 2016-08-19 DIAGNOSIS — Z23 Encounter for immunization: Secondary | ICD-10-CM | POA: Diagnosis not present

## 2016-08-19 NOTE — ED Provider Notes (Signed)
[facility] DEPT Provider Note   CSN: [id number] Arrival date & time: 08/03/16  9169     History   Chief Complaint Chief Complaint  Patient presents with  . Weakness    HPI James [name] is a 60 y.o. male.  HPI  60 y.o. male with medical history significant of ESRD, CHF, diabetes, HTN. Patient reports one day history of generalized weakness. States that symptoms came on gradually. Pt denies nausea, emesis, fevers, chills, chest pains, shortness of breath, headaches, abdominal pain, uti like symptoms. Last dialysis was on 07/31/2016 typical dialysis is Tuesday Thursday Saturday.  Past Medical History:  Diagnosis Date  . Asthma    as a child  . CHF (congestive heart failure) ([place])   . Chronic kidney disease    TTHS  . Diabetes mellitus without complication (HCC)    Type II  . ESRD (end stage renal disease) ([place])   . Hypertension     Patient Active Problem List   Diagnosis Date Noted  . Staphylococcus aureus bacteremia 08/04/2016  . Severe sepsis ([place]) 08/03/2016  . Anemia associated with chronic renal failure 08/03/2016  . Chronic systolic heart failure ([place]) 08/03/2016  . Hyponatremia 08/03/2016  . Elevated troponin 08/03/2016  . Multiple myeloma ([place])   . HTN (hypertension) 03/22/2015  . DM (diabetes mellitus) ([facility]) 03/22/2015  . ESRD on dialysis ([place]) 03/22/2015  . Anemia 03/22/2015    Past Surgical History:  Procedure Laterality Date  . AV FISTULA PLACEMENT Left 04/08/2015   Procedure: LEFT RADIOCEPHALIC ARTERIOVENOUS (AV) FISTULA CREATION;  Surgeon: Elam Dutch, MD;  Location: [facility];  Service: Vascular;  Laterality: Left;  . AV FISTULA PLACEMENT Right 09/15/2015   Procedure: RADIOCEPHALIC VERSUS BRACHIOCEPHALIC ARTERIOVENOUS (AV) FISTULA CREATION - RIGHT ARM;  Surgeon: Conrad Chilhowie, MD;  Location: [facility];  Service: Vascular;  Laterality: Right;  . AV FISTULA PLACEMENT Left 06/07/2016   Procedure: LEFT BRACHIOCEPHALIC ARTERIOVENOUS (AV) FISTULA CREATION;   Surgeon: Elam Dutch, MD;  Location: [facility];  Service: Vascular;  Laterality: Left;  . COLONOSCOPY    . IR GENERIC HISTORICAL  08/04/2016   IR REMOVAL TUN CV CATH W/O FL 08/04/2016 Ascencion Dike, PA-C [facility]  . IR GENERIC HISTORICAL  08/06/2016   IR FLUORO GUIDE CV LINE LEFT 08/06/2016 Arne Cleveland, MD [facility]  . IR GENERIC HISTORICAL  08/06/2016   IR US GUIDE VASC ACCESS LEFT 08/06/2016 Arne Cleveland, MD [facility]  . LIGATION OF ARTERIOVENOUS  FISTULA Left 09/15/2015   Procedure: LIGATION OF RADIOCEPHALIC ARTERIOVENOUS FISTULA - LEFT ARM;  Surgeon: Conrad Sinai, MD;  Location: [facility];  Service: Vascular;  Laterality: Left;  . LIGATION OF ARTERIOVENOUS  FISTULA Right 04/16/2016   Procedure: LIGATION OF ARTERIOVENOUS  FISTULA;  Surgeon: Elam Dutch, MD;  Location: [facility];  Service: Vascular;  Laterality: Right;  . LIGATION OF COMPETING BRANCHES OF ARTERIOVENOUS FISTULA Left 07/16/2015   Procedure: LIGATION OF SIDE BRANCHES OF ARTERIOVENOUS FISTULA LEFT ARM;  Surgeon: Rosetta Posner, MD;  Location: [facility];  Service: Vascular;  Laterality: Left;  . PERIPHERAL VASCULAR CATHETERIZATION N/A 07/04/2015   Procedure: Fistulagram;  Surgeon: Elam Dutch, MD;  Location: [facility];  Service: Cardiovascular;  Laterality: N/A;  . PERIPHERAL VASCULAR CATHETERIZATION N/A 09/08/2015   Procedure: Fistulagram;  Surgeon: Conrad [place]on Station, MD;  Location: [facility];  Service: Cardiovascular;  Laterality: N/A;  . PERIPHERAL VASCULAR CATHETERIZATION Right 02/23/2016   Procedure: Fistulagram;  Surgeon: Conrad Irene,  MD;  Location: [facility];  Service: Cardiovascular;  Laterality: Right;  . PERIPHERAL VASCULAR CATHETERIZATION N/A 04/12/2016   Procedure: Fistulagram;  Surgeon: Conrad [place], MD;  Location: [facility];  Service: Cardiovascular;  Laterality: N/A;  . REVISON OF ARTERIOVENOUS FISTULA Right 03/10/2016   Procedure: RIGHT CEPHALIC VEIN TURNDOWN;  Surgeon: Conrad La Junta,  MD;  Location: [facility];  Service: Vascular;  Laterality: Right;  . TEE WITHOUT CARDIOVERSION N/A 08/06/2016   Procedure: TRANSESOPHAGEAL ECHOCARDIOGRAM (TEE);  Surgeon: Dixie Dials, MD;  Location: [facility];  Service: Cardiovascular;  Laterality: N/A;       Home Medications    Prior to Admission medications   Medication Sig Start Date End Date Taking? Authorizing Provider  calcium acetate (PHOSLO) 667 MG capsule Take 2 capsules (1,334 mg total) by mouth 3 (three) times daily with meals. 04/12/15  Yes Modena Jansky, MD  Darbepoetin Alfa (ARANESP) 100 MCG/0.5ML SOSY injection Inject 0.5 mLs (100 mcg total) into the vein every Thursday with hemodialysis. Will be given at dialysis. 04/12/15  Yes Modena Jansky, MD  gabapentin (NEURONTIN) 300 MG capsule Take 1 capsule (300 mg total) by mouth at bedtime. 04/12/15  Yes Modena Jansky, MD  insulin aspart (NOVOLOG) 100 UNIT/ML injection Inject 6 Units into the skin 3 (three) times daily before meals. 07/28/16  Yes Wardell Honour, MD  insulin detemir (LEVEMIR) 100 UNIT/ML injection Inject 40 Units into the skin at bedtime.    Yes Historical Provider, MD  isosorbide mononitrate (IMDUR) 60 MG 24 hr tablet Take 1 tablet (60 mg total) by mouth daily. 04/12/15  Yes Modena Jansky, MD  Vitamin D, Ergocalciferol, (DRISDOL) 50000 units CAPS capsule Take 50,000 Units by mouth every 7 (seven) days. Monday   Yes Historical Provider, MD  ceFAZolin (ANCEF) 2-4 GM/100ML-% IVPB Inject 100 mLs (2 g total) into the vein Every Tuesday,Thursday,and Saturday with dialysis. 08/07/16   Nita Sells, MD    Family History Family History  Problem Relation Age of Onset  . Hypertension Other     Social History Social History  Substance Use Topics  . Smoking status: Former Smoker    Years: 1.00    Types: Cigarettes    Quit date: 07/08/2005  . Smokeless tobacco: Never Used  . Alcohol use No     Allergies   Review of patient's allergies indicates no known  allergies.   Review of Systems Review of Systems  ROS 10 Systems reviewed and are negative for acute change except as noted in the HPI.     Physical Exam Updated Vital Signs BP 111/62 (BP Location: Right Arm)   Pulse 82   Temp 99.8 F (37.7 C) (Oral)   Resp 18   Ht 5' 5"  (1.651 m)   Wt 207 lb 14.4 oz (94.3 kg)   SpO2 99%   BMI 34.60 kg/m   Physical Exam  Constitutional: He is oriented to person, place, and time. He appears well-developed.  HENT:  Head: Normocephalic and atraumatic.  Eyes: Conjunctivae and EOM are normal. Pupils are equal, round, and reactive to light.  Neck: Normal range of motion. Neck supple.  Cardiovascular: Normal rate, regular rhythm and normal heart sounds.   Pulmonary/Chest: Effort normal and breath sounds normal. No respiratory distress. He has no wheezes.  Tunneled chest wall catheter - appears to have some erythema  Abdominal: Soft. Bowel sounds are normal. He exhibits no distension. There is no tenderness. There is no rebound and no  guarding.  Neurological: He is alert and oriented to person, place, and time.  Skin: Skin is warm.  Nursing note and vitals reviewed.    ED Treatments / Results  Labs (all labs ordered are listed, but only abnormal results are displayed) Labs Reviewed  CULTURE, BLOOD (ROUTINE X 2) - Abnormal; Notable for the following:       Result Value   Culture STAPHYLOCOCCUS AUREUS (*)    All other components within normal limits  CULTURE, BLOOD (ROUTINE X 2) - Abnormal; Notable for the following:    Culture   (*)    Value: STAPHYLOCOCCUS AUREUS SUSCEPTIBILITIES PERFORMED ON PREVIOUS CULTURE WITHIN THE LAST 5 DAYS.    All other components within normal limits  BLOOD CULTURE ID PANEL (REFLEXED) - Abnormal; Notable for the following:    Staphylococcus species DETECTED (*)    Staphylococcus aureus DETECTED (*)    All other components within normal limits  CATH TIP CULTURE - Abnormal; Notable for the following:     Culture   (*)    Value: 10,000 COLONIES/mL STAPHYLOCOCCUS AUREUS 20,000 COLONIES/mL STAPHYLOCOCCUS SPECIES (COAGULASE NEGATIVE) CALL MICROBIOLOGY LAB IF SENSITIVITIES ARE REQUIRED.    Organism ID, Bacteria STAPHYLOCOCCUS AUREUS (*)    All other components within normal limits  CULTURE, BLOOD (ROUTINE X 2) - Abnormal; Notable for the following:    Culture   (*)    Value: STAPHYLOCOCCUS AUREUS SUSCEPTIBILITIES PERFORMED ON PREVIOUS CULTURE WITHIN THE LAST 5 DAYS.    All other components within normal limits  CBC WITH DIFFERENTIAL/PLATELET - Abnormal; Notable for the following:    WBC 15.3 (*)    RBC 3.67 (*)    Hemoglobin 10.6 (*)    HCT 32.3 (*)    Neutro Abs 13.6 (*)    All other components within normal limits  BASIC METABOLIC PANEL - Abnormal; Notable for the following:    Sodium 124 (*)    Chloride 92 (*)    CO2 17 (*)    Glucose, Bld 375 (*)    BUN 53 (*)    Creatinine, Ser 11.12 (*)    Calcium 8.2 (*)    GFR calc non Af Amer 4 (*)    GFR calc Af Amer 5 (*)    All other components within normal limits  TROPONIN I - Abnormal; Notable for the following:    Troponin I 0.06 (*)    All other components within normal limits  PROTIME-INR - Abnormal; Notable for the following:    Prothrombin Time 17.5 (*)    All other components within normal limits  PROTIME-INR - Abnormal; Notable for the following:    Prothrombin Time 17.4 (*)    All other components within normal limits  RENAL FUNCTION PANEL - Abnormal; Notable for the following:    Sodium 131 (*)    Potassium 3.3 (*)    Chloride 95 (*)    Glucose, Bld 190 (*)    Creatinine, Ser 5.39 (*)    Calcium 8.0 (*)    Phosphorus 1.9 (*)    Albumin 2.9 (*)    GFR calc non Af Amer 11 (*)    GFR calc Af Amer 12 (*)    All other components within normal limits  CBC - Abnormal; Notable for the following:    WBC 15.4 (*)    RBC 3.87 (*)    Hemoglobin 11.2 (*)    HCT 34.6 (*)    All other components within normal limits  CBC  - Abnormal;  Notable for the following:    WBC 15.0 (*)    RBC 3.62 (*)    Hemoglobin 10.5 (*)    HCT 32.1 (*)    All other components within normal limits  COMPREHENSIVE METABOLIC PANEL - Abnormal; Notable for the following:    Sodium 132 (*)    Potassium 3.2 (*)    Chloride 96 (*)    Glucose, Bld 201 (*)    BUN 24 (*)    Creatinine, Ser 6.78 (*)    Calcium 8.0 (*)    Albumin 2.6 (*)    ALT 9 (*)    GFR calc non Af Amer 8 (*)    GFR calc Af Amer 9 (*)    All other components within normal limits  GLUCOSE, CAPILLARY - Abnormal; Notable for the following:    Glucose-Capillary 223 (*)    All other components within normal limits  GLUCOSE, CAPILLARY - Abnormal; Notable for the following:    Glucose-Capillary 154 (*)    All other components within normal limits  GLUCOSE, CAPILLARY - Abnormal; Notable for the following:    Glucose-Capillary 197 (*)    All other components within normal limits  CBC - Abnormal; Notable for the following:    RBC 3.46 (*)    Hemoglobin 9.8 (*)    HCT 31.0 (*)    All other components within normal limits  COMPREHENSIVE METABOLIC PANEL - Abnormal; Notable for the following:    Sodium 129 (*)    Potassium 3.1 (*)    Chloride 93 (*)    Glucose, Bld 134 (*)    BUN 41 (*)    Creatinine, Ser 8.92 (*)    Calcium 8.3 (*)    Albumin 2.3 (*)    ALT 9 (*)    GFR calc non Af Amer 6 (*)    GFR calc Af Amer 7 (*)    All other components within normal limits  TROPONIN I - Abnormal; Notable for the following:    Troponin I 0.03 (*)    All other components within normal limits  GLUCOSE, CAPILLARY - Abnormal; Notable for the following:    Glucose-Capillary 157 (*)    All other components within normal limits  GLUCOSE, CAPILLARY - Abnormal; Notable for the following:    Glucose-Capillary 179 (*)    All other components within normal limits  GLUCOSE, CAPILLARY - Abnormal; Notable for the following:    Glucose-Capillary 109 (*)    All other components within  normal limits  GLUCOSE, CAPILLARY - Abnormal; Notable for the following:    Glucose-Capillary 182 (*)    All other components within normal limits  CBC - Abnormal; Notable for the following:    RBC 3.43 (*)    Hemoglobin 9.7 (*)    HCT 30.6 (*)    All other components within normal limits  GLUCOSE, CAPILLARY - Abnormal; Notable for the following:    Glucose-Capillary 225 (*)    All other components within normal limits  GLUCOSE, CAPILLARY - Abnormal; Notable for the following:    Glucose-Capillary 176 (*)    All other components within normal limits  RENAL FUNCTION PANEL - Abnormal; Notable for the following:    Sodium 128 (*)    Potassium 3.3 (*)    Chloride 94 (*)    Glucose, Bld 118 (*)    BUN 61 (*)    Creatinine, Ser 11.17 (*)    Calcium 8.3 (*)    Albumin 2.5 (*)  GFR calc non Af Amer 4 (*)    GFR calc Af Amer 5 (*)    All other components within normal limits  CBC - Abnormal; Notable for the following:    RBC 3.53 (*)    Hemoglobin 10.0 (*)    HCT 31.7 (*)    All other components within normal limits  BASIC METABOLIC PANEL - Abnormal; Notable for the following:    Sodium 131 (*)    Chloride 95 (*)    Glucose, Bld 222 (*)    BUN 37 (*)    Creatinine, Ser 7.66 (*)    Calcium 8.1 (*)    GFR calc non Af Amer 7 (*)    GFR calc Af Amer 8 (*)    All other components within normal limits  GLUCOSE, CAPILLARY - Abnormal; Notable for the following:    Glucose-Capillary 151 (*)    All other components within normal limits  GLUCOSE, CAPILLARY - Abnormal; Notable for the following:    Glucose-Capillary 190 (*)    All other components within normal limits  GLUCOSE, CAPILLARY - Abnormal; Notable for the following:    Glucose-Capillary 200 (*)    All other components within normal limits  GLUCOSE, CAPILLARY - Abnormal; Notable for the following:    Glucose-Capillary 174 (*)    All other components within normal limits  GLUCOSE, CAPILLARY - Abnormal; Notable for the  following:    Glucose-Capillary 211 (*)    All other components within normal limits  GLUCOSE, CAPILLARY - Abnormal; Notable for the following:    Glucose-Capillary 170 (*)    All other components within normal limits  GLUCOSE, CAPILLARY - Abnormal; Notable for the following:    Glucose-Capillary 185 (*)    All other components within normal limits  I-STAT CHEM 8, ED - Abnormal; Notable for the following:    Sodium 128 (*)    Chloride 93 (*)    BUN 51 (*)    Creatinine, Ser 11.80 (*)    Glucose, Bld 378 (*)    Calcium, Ion 0.93 (*)    Hemoglobin 11.9 (*)    HCT 35.0 (*)    All other components within normal limits  I-STAT VENOUS BLOOD GAS, ED - Abnormal; Notable for the following:    pCO2, Ven 31.8 (*)    Acid-base deficit 3.0 (*)    All other components within normal limits  CBG MONITORING, ED - Abnormal; Notable for the following:    Glucose-Capillary 369 (*)    All other components within normal limits  MRSA PCR SCREENING  CULTURE, BLOOD (ROUTINE X 2)  CULTURE, BLOOD (ROUTINE X 2)  CULTURE, BLOOD (ROUTINE X 2)  LACTIC ACID, PLASMA  PROCALCITONIN  APTT  GLUCOSE, CAPILLARY  GLUCOSE, CAPILLARY  GLUCOSE, CAPILLARY  I-STAT CG4 LACTIC ACID, ED  I-STAT CG4 LACTIC ACID, ED    EKG  EKG Interpretation  Date/Time:  Tuesday August 03 2016 06:39:26 EDT Ventricular Rate:  117 PR Interval:  186 QRS Duration: 72 QT Interval:  298 QTC Calculation: 415 R Axis:   68 Text Interpretation:  Sinus tachycardia T wave abnormality, consider inferior ischemia Abnormal ECG Nonspecific ST and T wave abnormality No acute changes Confirmed by Kathrynn Humble, MD, Thelma Comp ([id number]) on 08/03/2016 8:00:37 AM Also confirmed by Kathrynn Humble, MD, Thelma Comp [phone]), editor [place] CT, Leda Gauze [phone])  on 08/03/2016 9:16:50 AM       Radiology No results found.  Procedures .Critical Care Performed by: Varney Biles Authorized by: Varney Biles   Critical  care provider statement:    Critical care time  (minutes):  40   Critical care was necessary to treat or prevent imminent or life-threatening deterioration of the following conditions:  Sepsis   Critical care was time spent personally by me on the following activities:  Blood draw for specimens, development of treatment plan with patient or surrogate, discussions with consultants, evaluation of patient's response to treatment, examination of patient, interpretation of cardiac output measurements, ordering and performing treatments and interventions, ordering and review of laboratory studies, ordering and review of radiographic studies, pulse oximetry, re-evaluation of patient's condition and review of old charts    Angiocath insertion Performed by: Varney Biles  Consent: Verbal consent obtained. Risks and benefits: risks, benefits and alternatives were discussed Time out: Immediately prior to procedure a "time out" was called to verify the correct patient, procedure, equipment, support staff and site/side marked as required.  Preparation: Patient was prepped and draped in the usual sterile fashion.  Vein Location: right EJ  Ultrasound Guided - NO  Gauge: 20  Normal blood return and flush without difficulty Patient tolerance: Patient tolerated the procedure well with no immediate complications.     (including critical care time)  Medications Ordered in ED Medications  fentaNYL (SUBLIMAZE) 100 MCG/2ML injection (not administered)  midazolam (VERSED) 2 MG/2ML injection (not administered)  piperacillin-tazobactam (ZOSYN) IVPB 3.375 g (0 g Intravenous Stopped 08/03/16 0920)  acetaminophen (TYLENOL) tablet 650 mg (650 mg Oral Given 08/03/16 0939)  vancomycin (VANCOCIN) 2,000 mg in sodium chloride 0.9 % 500 mL IVPB (0 mg Intravenous Stopped 08/03/16 1247)  chlorhexidine (HIBICLENS) 4 % liquid (  Given 08/04/16 0856)  lidocaine (XYLOCAINE) 1 % (with pres) injection (5 mLs  Given 08/04/16 0853)  ceFAZolin (ANCEF) IVPB 2g/100 mL premix (2  g Intravenous Given 08/05/16 1428)  potassium chloride (K-DUR) CR tablet 20 mEq (20 mEq Oral Given 08/05/16 1712)  ceFAZolin (ANCEF) IVPB 2g/100 mL premix (2 g Intravenous Given 08/06/16 1028)  heparin 1000 UNIT/ML injection (1 Units  Given 08/06/16 1128)  lidocaine (XYLOCAINE) 1 % (with pres) injection (4 mLs Infiltration Given 08/06/16 1128)  ceFAZolin (ANCEF) 2-4 GM/100ML-% IVPB (  Given by Other 08/06/16 1115)  midazolam (VERSED) injection (1 mg Intravenous Given 08/06/16 1114)  fentaNYL (SUBLIMAZE) injection (50 mcg Intravenous Given 08/06/16 1115)  alteplase (CATHFLO ACTIVASE) injection 2 mg (2 mg Intracatheter Given 08/08/16 1216)  alteplase (CATHFLO ACTIVASE) injection 2 mg (2 mg Intracatheter Given 08/08/16 1215)     Initial Impression / Assessment and Plan / ED Course  I have reviewed the triage vital signs and the nursing notes.  Pertinent labs & imaging results that were available during my care of the patient were reviewed by me and considered in my medical decision making (see chart for details).  Clinical Course    Pt is septic, likely bacteremia due to catheter related infection.  Final Clinical Impressions(s) / ED Diagnoses   Final diagnoses:  Sepsis, due to unspecified organism ([place])  ESRD (end stage renal disease) on dialysis [facility])    New Prescriptions Discharge Medication List as of 08/08/2016  4:31 PM    START taking these medications   Details  ceFAZolin (ANCEF) 2-4 GM/100ML-% IVPB Inject 100 mLs (2 g total) into the vein Every Tuesday,Thursday,and Saturday with dialysis., Starting Sat 08/07/2016, Normal         Varney Biles, MD 08/19/16 [phone]

## 2016-08-20 ENCOUNTER — Encounter: Payer: Self-pay | Admitting: Vascular Surgery

## 2016-08-21 DIAGNOSIS — D631 Anemia in chronic kidney disease: Secondary | ICD-10-CM | POA: Diagnosis not present

## 2016-08-21 DIAGNOSIS — A4901 Methicillin susceptible Staphylococcus aureus infection, unspecified site: Secondary | ICD-10-CM | POA: Diagnosis not present

## 2016-08-21 DIAGNOSIS — Z23 Encounter for immunization: Secondary | ICD-10-CM | POA: Diagnosis not present

## 2016-08-21 DIAGNOSIS — N2581 Secondary hyperparathyroidism of renal origin: Secondary | ICD-10-CM | POA: Diagnosis not present

## 2016-08-21 DIAGNOSIS — E118 Type 2 diabetes mellitus with unspecified complications: Secondary | ICD-10-CM | POA: Diagnosis not present

## 2016-08-21 DIAGNOSIS — N186 End stage renal disease: Secondary | ICD-10-CM | POA: Diagnosis not present

## 2016-08-24 DIAGNOSIS — A4901 Methicillin susceptible Staphylococcus aureus infection, unspecified site: Secondary | ICD-10-CM | POA: Diagnosis not present

## 2016-08-24 DIAGNOSIS — N186 End stage renal disease: Secondary | ICD-10-CM | POA: Diagnosis not present

## 2016-08-24 DIAGNOSIS — Z23 Encounter for immunization: Secondary | ICD-10-CM | POA: Diagnosis not present

## 2016-08-24 DIAGNOSIS — E118 Type 2 diabetes mellitus with unspecified complications: Secondary | ICD-10-CM | POA: Diagnosis not present

## 2016-08-24 DIAGNOSIS — N2581 Secondary hyperparathyroidism of renal origin: Secondary | ICD-10-CM | POA: Diagnosis not present

## 2016-08-24 DIAGNOSIS — D631 Anemia in chronic kidney disease: Secondary | ICD-10-CM | POA: Diagnosis not present

## 2016-08-26 ENCOUNTER — Ambulatory Visit (HOSPITAL_COMMUNITY)
Admission: RE | Admit: 2016-08-26 | Discharge: 2016-08-26 | Disposition: A | Discharge status: Home / Self Care | Payer: Medicare Other | Source: Ambulatory Visit | Attending: Vascular Surgery | Admitting: Vascular Surgery

## 2016-08-26 ENCOUNTER — Ambulatory Visit (INDEPENDENT_AMBULATORY_CARE_PROVIDER_SITE_OTHER): Payer: Medicare Other | Admitting: Vascular Surgery

## 2016-08-26 ENCOUNTER — Encounter: Payer: Self-pay | Admitting: Vascular Surgery

## 2016-08-26 VITALS — BP 141/86 | HR 88 | Ht 65.0 in | Wt 206.0 lb

## 2016-08-26 DIAGNOSIS — I871 Compression of vein: Secondary | ICD-10-CM | POA: Diagnosis not present

## 2016-08-26 DIAGNOSIS — E118 Type 2 diabetes mellitus with unspecified complications: Secondary | ICD-10-CM | POA: Diagnosis not present

## 2016-08-26 DIAGNOSIS — Z992 Dependence on renal dialysis: Secondary | ICD-10-CM | POA: Diagnosis not present

## 2016-08-26 DIAGNOSIS — D631 Anemia in chronic kidney disease: Secondary | ICD-10-CM | POA: Diagnosis not present

## 2016-08-26 DIAGNOSIS — N186 End stage renal disease: Secondary | ICD-10-CM

## 2016-08-26 DIAGNOSIS — I770 Arteriovenous fistula, acquired: Secondary | ICD-10-CM | POA: Diagnosis not present

## 2016-08-26 DIAGNOSIS — T82858D Stenosis of vascular prosthetic devices, implants and grafts, subsequent encounter: Secondary | ICD-10-CM | POA: Diagnosis not present

## 2016-08-26 DIAGNOSIS — Z48812 Encounter for surgical aftercare following surgery on the circulatory system: Secondary | ICD-10-CM | POA: Diagnosis not present

## 2016-08-26 DIAGNOSIS — N2581 Secondary hyperparathyroidism of renal origin: Secondary | ICD-10-CM | POA: Diagnosis not present

## 2016-08-26 DIAGNOSIS — Z23 Encounter for immunization: Secondary | ICD-10-CM | POA: Diagnosis not present

## 2016-08-26 DIAGNOSIS — A4901 Methicillin susceptible Staphylococcus aureus infection, unspecified site: Secondary | ICD-10-CM | POA: Diagnosis not present

## 2016-08-26 NOTE — Progress Notes (Signed)
Patient is a 60 year old male who returns today for postoperative follow-up after placement of a left brachiocephalic AV fistula on 76/73/4193. He denies any numbness or tingling in his hand. Incision is well-healed. He previously had ligation of a right brachiocephalic AV fistula for venous hypertension. He still has some residual swelling in the right arm but this is essentially back to baseline.   Physical exam:    Vitals:   08/26/16 1121 08/26/16 1123  BP: (!) 144/88 (!) 141/86  Pulse: 88   SpO2: 96%   Weight: 206 lb (93.4 kg)   Height: 5\' 5"  (1.651 m)     Neck: Right-sided dialysis catheter   Left upper extremity: Palpable thrill fistula is palpable probably 6 mm diameter felt in the upper arm. Incision is well-healed.   Right upper extremity trace edema approximate 5% larger than left upper extremity  Data: Patient had a duplex ultrasound of his AV fistula today. It is 7-8 mm in diameter and less than 3 mm from the skin at the proximal aspect but slightly deeper up 50mm in depth at the upper portion   Assessment: Resolving venous hypertension symptoms right arm, maturing fistula left arm   Plan: Fistula is of reasonable diameter to start cannulation at this point. It is easily palpable in the proximal third of the fistula but comes deeper in the upper arm. The patient scheduled for a fistulogram a [facility] in the near future. If they do not see anything that looks anatomically fixable. We will need to consider whether or not to schedule him for superficialization.   Ruta Hinds, MD [facility] Office: [phone] Pager: [phone]

## 2016-08-27 ENCOUNTER — Other Ambulatory Visit: Payer: Self-pay

## 2016-08-27 MED ORDER — GLUCOSE BLOOD VI STRP: ORAL_STRIP | 2 refills | Status: AC

## 2016-08-28 DIAGNOSIS — D631 Anemia in chronic kidney disease: Secondary | ICD-10-CM | POA: Diagnosis not present

## 2016-08-28 DIAGNOSIS — A4901 Methicillin susceptible Staphylococcus aureus infection, unspecified site: Secondary | ICD-10-CM | POA: Diagnosis not present

## 2016-08-28 DIAGNOSIS — N2581 Secondary hyperparathyroidism of renal origin: Secondary | ICD-10-CM | POA: Diagnosis not present

## 2016-08-28 DIAGNOSIS — E118 Type 2 diabetes mellitus with unspecified complications: Secondary | ICD-10-CM | POA: Diagnosis not present

## 2016-08-28 DIAGNOSIS — N186 End stage renal disease: Secondary | ICD-10-CM | POA: Diagnosis not present

## 2016-08-28 DIAGNOSIS — Z23 Encounter for immunization: Secondary | ICD-10-CM | POA: Diagnosis not present

## 2016-08-31 DIAGNOSIS — N2581 Secondary hyperparathyroidism of renal origin: Secondary | ICD-10-CM | POA: Diagnosis not present

## 2016-08-31 DIAGNOSIS — N186 End stage renal disease: Secondary | ICD-10-CM | POA: Diagnosis not present

## 2016-08-31 DIAGNOSIS — D631 Anemia in chronic kidney disease: Secondary | ICD-10-CM | POA: Diagnosis not present

## 2016-08-31 DIAGNOSIS — E118 Type 2 diabetes mellitus with unspecified complications: Secondary | ICD-10-CM | POA: Diagnosis not present

## 2016-08-31 DIAGNOSIS — Z23 Encounter for immunization: Secondary | ICD-10-CM | POA: Diagnosis not present

## 2016-08-31 DIAGNOSIS — A4901 Methicillin susceptible Staphylococcus aureus infection, unspecified site: Secondary | ICD-10-CM | POA: Diagnosis not present

## 2016-09-02 DIAGNOSIS — Z23 Encounter for immunization: Secondary | ICD-10-CM | POA: Diagnosis not present

## 2016-09-02 DIAGNOSIS — E118 Type 2 diabetes mellitus with unspecified complications: Secondary | ICD-10-CM | POA: Diagnosis not present

## 2016-09-02 DIAGNOSIS — D631 Anemia in chronic kidney disease: Secondary | ICD-10-CM | POA: Diagnosis not present

## 2016-09-02 DIAGNOSIS — N186 End stage renal disease: Secondary | ICD-10-CM | POA: Diagnosis not present

## 2016-09-02 DIAGNOSIS — N2581 Secondary hyperparathyroidism of renal origin: Secondary | ICD-10-CM | POA: Diagnosis not present

## 2016-09-02 DIAGNOSIS — A4901 Methicillin susceptible Staphylococcus aureus infection, unspecified site: Secondary | ICD-10-CM | POA: Diagnosis not present

## 2016-09-03 ENCOUNTER — Encounter: Payer: Self-pay | Admitting: Family Medicine

## 2016-09-03 ENCOUNTER — Ambulatory Visit (INDEPENDENT_AMBULATORY_CARE_PROVIDER_SITE_OTHER): Payer: Medicare Other | Admitting: Family Medicine

## 2016-09-03 VITALS — BP 125/63 | HR 84 | Temp 97.7°F | Ht 65.0 in | Wt 212.0 lb

## 2016-09-03 DIAGNOSIS — E118 Type 2 diabetes mellitus with unspecified complications: Secondary | ICD-10-CM

## 2016-09-03 DIAGNOSIS — Z794 Long term (current) use of insulin: Secondary | ICD-10-CM

## 2016-09-03 DIAGNOSIS — I10 Essential (primary) hypertension: Secondary | ICD-10-CM

## 2016-09-03 NOTE — Progress Notes (Signed)
Subjective:    Patient ID: James [name], male    DOB: [birth year], 58 y.o.   MRN: [id number]  HPI Patient here today for 1 month follow up on diabetes.He was started on long and short acting insulin when he first came here about 2 months ago. Sugars have gradually come under better control although we are not really need to be yet. He is good at checking sugars most days but not always currently he is on 43 of Levemir and 6 of Humalog at breakfast and at lunch and 8 at supper. Generally he feels better. Biggest issue now is getting the dialysis shots to work. He has had several and had problems with them all. Dialysis is occurring now mostly through central catheters. That carries risk of infection     Patient Active Problem List   Diagnosis Date Noted  . Staphylococcus aureus bacteremia 08/04/2016  . Severe sepsis (HCC) 08/03/2016  . Anemia associated with chronic renal failure 08/03/2016  . Chronic systolic heart failure (HCC) 08/03/2016  . Hyponatremia 08/03/2016  . Elevated troponin 08/03/2016  . Multiple myeloma (HCC)   . HTN (hypertension) 03/22/2015  . DM (diabetes mellitus) (HCC) 03/22/2015  . ESRD on dialysis (HCC) 03/22/2015  . Anemia 03/22/2015   Outpatient Encounter Prescriptions as of 09/03/2016  Medication Sig  . calcium acetate (PHOSLO) 667 MG capsule Take 2 capsules (1,334 mg total) by mouth 3 (three) times daily with meals.  Marland Kitchen ceFAZolin (ANCEF) 2-4 GM/100ML-% IVPB Inject 100 mLs (2 g total) into the vein Every Tuesday,Thursday,and Saturday with dialysis.  . Darbepoetin Alfa (ARANESP) 100 MCG/0.5ML SOSY injection Inject 0.5 mLs (100 mcg total) into the vein every Thursday with hemodialysis. Will be given at dialysis.  Marland Kitchen gabapentin (NEURONTIN) 300 MG capsule Take 1 capsule (300 mg total) by mouth at bedtime.  Marland Kitchen glucose blood (ONETOUCH VERIO) test strip Test three times daily  . insulin aspart (NOVOLOG) 100 UNIT/ML injection Inject 6 Units into the skin 3 (three) times  daily before meals.  . insulin detemir (LEVEMIR) 100 UNIT/ML injection Inject 40 Units into the skin at bedtime.   . isosorbide mononitrate (IMDUR) 60 MG 24 hr tablet Take 1 tablet (60 mg total) by mouth daily.  . Vitamin D, Ergocalciferol, (DRISDOL) 50000 units CAPS capsule Take 50,000 Units by mouth every 7 (seven) days. Monday   No facility-administered encounter medications on file as of 09/03/2016.       Review of Systems  Constitutional: Negative.   HENT: Negative.   Eyes: Negative.   Respiratory: Negative.   Cardiovascular: Negative.   Gastrointestinal: Negative.   Endocrine: Negative.   Genitourinary: Negative.   Musculoskeletal: Negative.   Skin: Negative.   Allergic/Immunologic: Negative.   Neurological: Negative.   Hematological: Negative.   Psychiatric/Behavioral: Negative.        Objective:   Physical Exam  Constitutional: He is oriented to person, place, and time. He appears well-developed and well-nourished.  Cardiovascular: Normal rate, regular rhythm and normal heart sounds.   Pulmonary/Chest: Effort normal and breath sounds normal.  Neurological: He is alert and oriented to person, place, and time.  Psychiatric: He has a normal mood and affect. His behavior is normal.    BP 125/63 (BP Location: Right Arm)   Pulse 84   Temp 97.7 F (36.5 C) (Oral)   Ht 5\' 5"  (1.651 m)   Wt 212 lb (96.2 kg)   BMI 35.28 kg/m        Assessment & Plan:  1. Essential  hypertension Blood pressure is controlled largely through dialysis at this time  2. Type 2 diabetes mellitus with complication, with long-term current use of insulin (HCC) As noted above sugars have improved. Last A1c was 12.2 on initial visit. We will check again at 1 month which will be 3 months after initial visit I would expect significant improvement at that time. Continue on same regimen of long and short acting insulin  Frederica Kuster MD

## 2016-09-04 DIAGNOSIS — Z23 Encounter for immunization: Secondary | ICD-10-CM | POA: Diagnosis not present

## 2016-09-04 DIAGNOSIS — N2581 Secondary hyperparathyroidism of renal origin: Secondary | ICD-10-CM | POA: Diagnosis not present

## 2016-09-04 DIAGNOSIS — A4901 Methicillin susceptible Staphylococcus aureus infection, unspecified site: Secondary | ICD-10-CM | POA: Diagnosis not present

## 2016-09-04 DIAGNOSIS — N041 Nephrotic syndrome with focal and segmental glomerular lesions: Secondary | ICD-10-CM | POA: Diagnosis not present

## 2016-09-04 DIAGNOSIS — E118 Type 2 diabetes mellitus with unspecified complications: Secondary | ICD-10-CM | POA: Diagnosis not present

## 2016-09-04 DIAGNOSIS — N186 End stage renal disease: Secondary | ICD-10-CM | POA: Diagnosis not present

## 2016-09-04 DIAGNOSIS — D631 Anemia in chronic kidney disease: Secondary | ICD-10-CM | POA: Diagnosis not present

## 2016-09-04 DIAGNOSIS — Z992 Dependence on renal dialysis: Secondary | ICD-10-CM | POA: Diagnosis not present

## 2016-09-07 DIAGNOSIS — N2581 Secondary hyperparathyroidism of renal origin: Secondary | ICD-10-CM | POA: Diagnosis not present

## 2016-09-07 DIAGNOSIS — N186 End stage renal disease: Secondary | ICD-10-CM | POA: Diagnosis not present

## 2016-09-07 DIAGNOSIS — E118 Type 2 diabetes mellitus with unspecified complications: Secondary | ICD-10-CM | POA: Diagnosis not present

## 2016-09-07 DIAGNOSIS — D631 Anemia in chronic kidney disease: Secondary | ICD-10-CM | POA: Diagnosis not present

## 2016-09-09 DIAGNOSIS — E118 Type 2 diabetes mellitus with unspecified complications: Secondary | ICD-10-CM | POA: Diagnosis not present

## 2016-09-09 DIAGNOSIS — N2581 Secondary hyperparathyroidism of renal origin: Secondary | ICD-10-CM | POA: Diagnosis not present

## 2016-09-09 DIAGNOSIS — D631 Anemia in chronic kidney disease: Secondary | ICD-10-CM | POA: Diagnosis not present

## 2016-09-09 DIAGNOSIS — N186 End stage renal disease: Secondary | ICD-10-CM | POA: Diagnosis not present

## 2016-09-11 DIAGNOSIS — N186 End stage renal disease: Secondary | ICD-10-CM | POA: Diagnosis not present

## 2016-09-11 DIAGNOSIS — E118 Type 2 diabetes mellitus with unspecified complications: Secondary | ICD-10-CM | POA: Diagnosis not present

## 2016-09-11 DIAGNOSIS — N2581 Secondary hyperparathyroidism of renal origin: Secondary | ICD-10-CM | POA: Diagnosis not present

## 2016-09-11 DIAGNOSIS — D631 Anemia in chronic kidney disease: Secondary | ICD-10-CM | POA: Diagnosis not present

## 2016-09-14 DIAGNOSIS — D631 Anemia in chronic kidney disease: Secondary | ICD-10-CM | POA: Diagnosis not present

## 2016-09-14 DIAGNOSIS — N186 End stage renal disease: Secondary | ICD-10-CM | POA: Diagnosis not present

## 2016-09-14 DIAGNOSIS — N2581 Secondary hyperparathyroidism of renal origin: Secondary | ICD-10-CM | POA: Diagnosis not present

## 2016-09-14 DIAGNOSIS — E118 Type 2 diabetes mellitus with unspecified complications: Secondary | ICD-10-CM | POA: Diagnosis not present

## 2016-09-16 DIAGNOSIS — N2581 Secondary hyperparathyroidism of renal origin: Secondary | ICD-10-CM | POA: Diagnosis not present

## 2016-09-16 DIAGNOSIS — E118 Type 2 diabetes mellitus with unspecified complications: Secondary | ICD-10-CM | POA: Diagnosis not present

## 2016-09-16 DIAGNOSIS — D631 Anemia in chronic kidney disease: Secondary | ICD-10-CM | POA: Diagnosis not present

## 2016-09-16 DIAGNOSIS — N186 End stage renal disease: Secondary | ICD-10-CM | POA: Diagnosis not present

## 2016-09-18 DIAGNOSIS — N186 End stage renal disease: Secondary | ICD-10-CM | POA: Diagnosis not present

## 2016-09-18 DIAGNOSIS — N2581 Secondary hyperparathyroidism of renal origin: Secondary | ICD-10-CM | POA: Diagnosis not present

## 2016-09-18 DIAGNOSIS — D631 Anemia in chronic kidney disease: Secondary | ICD-10-CM | POA: Diagnosis not present

## 2016-09-18 DIAGNOSIS — E118 Type 2 diabetes mellitus with unspecified complications: Secondary | ICD-10-CM | POA: Diagnosis not present

## 2016-09-21 DIAGNOSIS — N2581 Secondary hyperparathyroidism of renal origin: Secondary | ICD-10-CM | POA: Diagnosis not present

## 2016-09-21 DIAGNOSIS — E118 Type 2 diabetes mellitus with unspecified complications: Secondary | ICD-10-CM | POA: Diagnosis not present

## 2016-09-21 DIAGNOSIS — D631 Anemia in chronic kidney disease: Secondary | ICD-10-CM | POA: Diagnosis not present

## 2016-09-21 DIAGNOSIS — N186 End stage renal disease: Secondary | ICD-10-CM | POA: Diagnosis not present

## 2016-09-23 DIAGNOSIS — E118 Type 2 diabetes mellitus with unspecified complications: Secondary | ICD-10-CM | POA: Diagnosis not present

## 2016-09-23 DIAGNOSIS — D631 Anemia in chronic kidney disease: Secondary | ICD-10-CM | POA: Diagnosis not present

## 2016-09-23 DIAGNOSIS — N186 End stage renal disease: Secondary | ICD-10-CM | POA: Diagnosis not present

## 2016-09-23 DIAGNOSIS — N2581 Secondary hyperparathyroidism of renal origin: Secondary | ICD-10-CM | POA: Diagnosis not present

## 2016-09-25 DIAGNOSIS — E118 Type 2 diabetes mellitus with unspecified complications: Secondary | ICD-10-CM | POA: Diagnosis not present

## 2016-09-25 DIAGNOSIS — N2581 Secondary hyperparathyroidism of renal origin: Secondary | ICD-10-CM | POA: Diagnosis not present

## 2016-09-25 DIAGNOSIS — N186 End stage renal disease: Secondary | ICD-10-CM | POA: Diagnosis not present

## 2016-09-25 DIAGNOSIS — D631 Anemia in chronic kidney disease: Secondary | ICD-10-CM | POA: Diagnosis not present

## 2016-09-28 DIAGNOSIS — E118 Type 2 diabetes mellitus with unspecified complications: Secondary | ICD-10-CM | POA: Diagnosis not present

## 2016-09-28 DIAGNOSIS — N2581 Secondary hyperparathyroidism of renal origin: Secondary | ICD-10-CM | POA: Diagnosis not present

## 2016-09-28 DIAGNOSIS — N186 End stage renal disease: Secondary | ICD-10-CM | POA: Diagnosis not present

## 2016-09-28 DIAGNOSIS — D631 Anemia in chronic kidney disease: Secondary | ICD-10-CM | POA: Diagnosis not present

## 2016-09-30 DIAGNOSIS — N186 End stage renal disease: Secondary | ICD-10-CM | POA: Diagnosis not present

## 2016-09-30 DIAGNOSIS — N2581 Secondary hyperparathyroidism of renal origin: Secondary | ICD-10-CM | POA: Diagnosis not present

## 2016-09-30 DIAGNOSIS — D631 Anemia in chronic kidney disease: Secondary | ICD-10-CM | POA: Diagnosis not present

## 2016-09-30 DIAGNOSIS — E118 Type 2 diabetes mellitus with unspecified complications: Secondary | ICD-10-CM | POA: Diagnosis not present

## 2016-10-02 DIAGNOSIS — D631 Anemia in chronic kidney disease: Secondary | ICD-10-CM | POA: Diagnosis not present

## 2016-10-02 DIAGNOSIS — N186 End stage renal disease: Secondary | ICD-10-CM | POA: Diagnosis not present

## 2016-10-02 DIAGNOSIS — E118 Type 2 diabetes mellitus with unspecified complications: Secondary | ICD-10-CM | POA: Diagnosis not present

## 2016-10-02 DIAGNOSIS — N2581 Secondary hyperparathyroidism of renal origin: Secondary | ICD-10-CM | POA: Diagnosis not present

## 2016-10-05 DIAGNOSIS — N186 End stage renal disease: Secondary | ICD-10-CM | POA: Diagnosis not present

## 2016-10-05 DIAGNOSIS — E118 Type 2 diabetes mellitus with unspecified complications: Secondary | ICD-10-CM | POA: Diagnosis not present

## 2016-10-05 DIAGNOSIS — Z992 Dependence on renal dialysis: Secondary | ICD-10-CM | POA: Diagnosis not present

## 2016-10-05 DIAGNOSIS — D631 Anemia in chronic kidney disease: Secondary | ICD-10-CM | POA: Diagnosis not present

## 2016-10-05 DIAGNOSIS — N2581 Secondary hyperparathyroidism of renal origin: Secondary | ICD-10-CM | POA: Diagnosis not present

## 2016-10-05 DIAGNOSIS — N041 Nephrotic syndrome with focal and segmental glomerular lesions: Secondary | ICD-10-CM | POA: Diagnosis not present

## 2016-10-07 DIAGNOSIS — N2581 Secondary hyperparathyroidism of renal origin: Secondary | ICD-10-CM | POA: Diagnosis not present

## 2016-10-07 DIAGNOSIS — E118 Type 2 diabetes mellitus with unspecified complications: Secondary | ICD-10-CM | POA: Diagnosis not present

## 2016-10-07 DIAGNOSIS — N186 End stage renal disease: Secondary | ICD-10-CM | POA: Diagnosis not present

## 2016-10-09 DIAGNOSIS — N186 End stage renal disease: Secondary | ICD-10-CM | POA: Diagnosis not present

## 2016-10-09 DIAGNOSIS — N2581 Secondary hyperparathyroidism of renal origin: Secondary | ICD-10-CM | POA: Diagnosis not present

## 2016-10-09 DIAGNOSIS — E118 Type 2 diabetes mellitus with unspecified complications: Secondary | ICD-10-CM | POA: Diagnosis not present

## 2016-10-12 DIAGNOSIS — N186 End stage renal disease: Secondary | ICD-10-CM | POA: Diagnosis not present

## 2016-10-12 DIAGNOSIS — E118 Type 2 diabetes mellitus with unspecified complications: Secondary | ICD-10-CM | POA: Diagnosis not present

## 2016-10-12 DIAGNOSIS — N2581 Secondary hyperparathyroidism of renal origin: Secondary | ICD-10-CM | POA: Diagnosis not present

## 2016-10-14 DIAGNOSIS — N2581 Secondary hyperparathyroidism of renal origin: Secondary | ICD-10-CM | POA: Diagnosis not present

## 2016-10-14 DIAGNOSIS — E118 Type 2 diabetes mellitus with unspecified complications: Secondary | ICD-10-CM | POA: Diagnosis not present

## 2016-10-14 DIAGNOSIS — N186 End stage renal disease: Secondary | ICD-10-CM | POA: Diagnosis not present

## 2016-10-15 DIAGNOSIS — T82858A Stenosis of vascular prosthetic devices, implants and grafts, initial encounter: Secondary | ICD-10-CM | POA: Diagnosis not present

## 2016-10-15 DIAGNOSIS — I771 Stricture of artery: Secondary | ICD-10-CM | POA: Diagnosis not present

## 2016-10-15 DIAGNOSIS — N186 End stage renal disease: Secondary | ICD-10-CM | POA: Diagnosis not present

## 2016-10-15 DIAGNOSIS — Z992 Dependence on renal dialysis: Secondary | ICD-10-CM | POA: Diagnosis not present

## 2016-10-16 DIAGNOSIS — N186 End stage renal disease: Secondary | ICD-10-CM | POA: Diagnosis not present

## 2016-10-16 DIAGNOSIS — N2581 Secondary hyperparathyroidism of renal origin: Secondary | ICD-10-CM | POA: Diagnosis not present

## 2016-10-16 DIAGNOSIS — E118 Type 2 diabetes mellitus with unspecified complications: Secondary | ICD-10-CM | POA: Diagnosis not present

## 2016-10-19 DIAGNOSIS — E118 Type 2 diabetes mellitus with unspecified complications: Secondary | ICD-10-CM | POA: Diagnosis not present

## 2016-10-19 DIAGNOSIS — N186 End stage renal disease: Secondary | ICD-10-CM | POA: Diagnosis not present

## 2016-10-19 DIAGNOSIS — N2581 Secondary hyperparathyroidism of renal origin: Secondary | ICD-10-CM | POA: Diagnosis not present

## 2016-10-21 DIAGNOSIS — E118 Type 2 diabetes mellitus with unspecified complications: Secondary | ICD-10-CM | POA: Diagnosis not present

## 2016-10-21 DIAGNOSIS — N186 End stage renal disease: Secondary | ICD-10-CM | POA: Diagnosis not present

## 2016-10-21 DIAGNOSIS — N2581 Secondary hyperparathyroidism of renal origin: Secondary | ICD-10-CM | POA: Diagnosis not present

## 2016-10-23 DIAGNOSIS — N186 End stage renal disease: Secondary | ICD-10-CM | POA: Diagnosis not present

## 2016-10-23 DIAGNOSIS — E118 Type 2 diabetes mellitus with unspecified complications: Secondary | ICD-10-CM | POA: Diagnosis not present

## 2016-10-23 DIAGNOSIS — N2581 Secondary hyperparathyroidism of renal origin: Secondary | ICD-10-CM | POA: Diagnosis not present

## 2016-10-26 DIAGNOSIS — N186 End stage renal disease: Secondary | ICD-10-CM | POA: Diagnosis not present

## 2016-10-26 DIAGNOSIS — E118 Type 2 diabetes mellitus with unspecified complications: Secondary | ICD-10-CM | POA: Diagnosis not present

## 2016-10-26 DIAGNOSIS — N2581 Secondary hyperparathyroidism of renal origin: Secondary | ICD-10-CM | POA: Diagnosis not present

## 2016-10-28 DIAGNOSIS — E118 Type 2 diabetes mellitus with unspecified complications: Secondary | ICD-10-CM | POA: Diagnosis not present

## 2016-10-28 DIAGNOSIS — N186 End stage renal disease: Secondary | ICD-10-CM | POA: Diagnosis not present

## 2016-10-28 DIAGNOSIS — N2581 Secondary hyperparathyroidism of renal origin: Secondary | ICD-10-CM | POA: Diagnosis not present

## 2016-10-30 DIAGNOSIS — E118 Type 2 diabetes mellitus with unspecified complications: Secondary | ICD-10-CM | POA: Diagnosis not present

## 2016-10-30 DIAGNOSIS — N2581 Secondary hyperparathyroidism of renal origin: Secondary | ICD-10-CM | POA: Diagnosis not present

## 2016-10-30 DIAGNOSIS — N186 End stage renal disease: Secondary | ICD-10-CM | POA: Diagnosis not present

## 2016-11-02 DIAGNOSIS — N2581 Secondary hyperparathyroidism of renal origin: Secondary | ICD-10-CM | POA: Diagnosis not present

## 2016-11-02 DIAGNOSIS — E118 Type 2 diabetes mellitus with unspecified complications: Secondary | ICD-10-CM | POA: Diagnosis not present

## 2016-11-02 DIAGNOSIS — N186 End stage renal disease: Secondary | ICD-10-CM | POA: Diagnosis not present

## 2016-11-04 DIAGNOSIS — N186 End stage renal disease: Secondary | ICD-10-CM | POA: Diagnosis not present

## 2016-11-04 DIAGNOSIS — Z992 Dependence on renal dialysis: Secondary | ICD-10-CM | POA: Diagnosis not present

## 2016-11-04 DIAGNOSIS — E118 Type 2 diabetes mellitus with unspecified complications: Secondary | ICD-10-CM | POA: Diagnosis not present

## 2016-11-04 DIAGNOSIS — N2581 Secondary hyperparathyroidism of renal origin: Secondary | ICD-10-CM | POA: Diagnosis not present

## 2016-11-04 DIAGNOSIS — N041 Nephrotic syndrome with focal and segmental glomerular lesions: Secondary | ICD-10-CM | POA: Diagnosis not present

## 2016-11-06 DIAGNOSIS — N186 End stage renal disease: Secondary | ICD-10-CM | POA: Diagnosis not present

## 2016-11-06 DIAGNOSIS — D631 Anemia in chronic kidney disease: Secondary | ICD-10-CM | POA: Diagnosis not present

## 2016-11-06 DIAGNOSIS — E118 Type 2 diabetes mellitus with unspecified complications: Secondary | ICD-10-CM | POA: Diagnosis not present

## 2016-11-06 DIAGNOSIS — N2581 Secondary hyperparathyroidism of renal origin: Secondary | ICD-10-CM | POA: Diagnosis not present

## 2016-11-09 DIAGNOSIS — D631 Anemia in chronic kidney disease: Secondary | ICD-10-CM | POA: Diagnosis not present

## 2016-11-09 DIAGNOSIS — N2581 Secondary hyperparathyroidism of renal origin: Secondary | ICD-10-CM | POA: Diagnosis not present

## 2016-11-09 DIAGNOSIS — E118 Type 2 diabetes mellitus with unspecified complications: Secondary | ICD-10-CM | POA: Diagnosis not present

## 2016-11-09 DIAGNOSIS — N186 End stage renal disease: Secondary | ICD-10-CM | POA: Diagnosis not present

## 2016-11-11 DIAGNOSIS — N186 End stage renal disease: Secondary | ICD-10-CM | POA: Diagnosis not present

## 2016-11-11 DIAGNOSIS — D631 Anemia in chronic kidney disease: Secondary | ICD-10-CM | POA: Diagnosis not present

## 2016-11-11 DIAGNOSIS — E118 Type 2 diabetes mellitus with unspecified complications: Secondary | ICD-10-CM | POA: Diagnosis not present

## 2016-11-11 DIAGNOSIS — N2581 Secondary hyperparathyroidism of renal origin: Secondary | ICD-10-CM | POA: Diagnosis not present

## 2016-11-16 DIAGNOSIS — N2581 Secondary hyperparathyroidism of renal origin: Secondary | ICD-10-CM | POA: Diagnosis not present

## 2016-11-16 DIAGNOSIS — D631 Anemia in chronic kidney disease: Secondary | ICD-10-CM | POA: Diagnosis not present

## 2016-11-16 DIAGNOSIS — E118 Type 2 diabetes mellitus with unspecified complications: Secondary | ICD-10-CM | POA: Diagnosis not present

## 2016-11-16 DIAGNOSIS — N186 End stage renal disease: Secondary | ICD-10-CM | POA: Diagnosis not present

## 2016-11-18 DIAGNOSIS — N2581 Secondary hyperparathyroidism of renal origin: Secondary | ICD-10-CM | POA: Diagnosis not present

## 2016-11-18 DIAGNOSIS — N186 End stage renal disease: Secondary | ICD-10-CM | POA: Diagnosis not present

## 2016-11-18 DIAGNOSIS — D631 Anemia in chronic kidney disease: Secondary | ICD-10-CM | POA: Diagnosis not present

## 2016-11-18 DIAGNOSIS — E118 Type 2 diabetes mellitus with unspecified complications: Secondary | ICD-10-CM | POA: Diagnosis not present

## 2016-11-19 DIAGNOSIS — Z452 Encounter for adjustment and management of vascular access device: Secondary | ICD-10-CM | POA: Diagnosis not present

## 2016-11-20 DIAGNOSIS — E118 Type 2 diabetes mellitus with unspecified complications: Secondary | ICD-10-CM | POA: Diagnosis not present

## 2016-11-20 DIAGNOSIS — D631 Anemia in chronic kidney disease: Secondary | ICD-10-CM | POA: Diagnosis not present

## 2016-11-20 DIAGNOSIS — N2581 Secondary hyperparathyroidism of renal origin: Secondary | ICD-10-CM | POA: Diagnosis not present

## 2016-11-20 DIAGNOSIS — N186 End stage renal disease: Secondary | ICD-10-CM | POA: Diagnosis not present

## 2016-11-23 ENCOUNTER — Telehealth: Payer: Self-pay | Admitting: Family Medicine

## 2016-11-23 DIAGNOSIS — N186 End stage renal disease: Secondary | ICD-10-CM | POA: Diagnosis not present

## 2016-11-23 DIAGNOSIS — D631 Anemia in chronic kidney disease: Secondary | ICD-10-CM | POA: Diagnosis not present

## 2016-11-23 DIAGNOSIS — N2581 Secondary hyperparathyroidism of renal origin: Secondary | ICD-10-CM | POA: Diagnosis not present

## 2016-11-23 DIAGNOSIS — E118 Type 2 diabetes mellitus with unspecified complications: Secondary | ICD-10-CM | POA: Diagnosis not present

## 2016-11-25 DIAGNOSIS — N2581 Secondary hyperparathyroidism of renal origin: Secondary | ICD-10-CM | POA: Diagnosis not present

## 2016-11-25 DIAGNOSIS — D631 Anemia in chronic kidney disease: Secondary | ICD-10-CM | POA: Diagnosis not present

## 2016-11-25 DIAGNOSIS — N186 End stage renal disease: Secondary | ICD-10-CM | POA: Diagnosis not present

## 2016-11-25 DIAGNOSIS — E118 Type 2 diabetes mellitus with unspecified complications: Secondary | ICD-10-CM | POA: Diagnosis not present

## 2016-11-27 DIAGNOSIS — N186 End stage renal disease: Secondary | ICD-10-CM | POA: Diagnosis not present

## 2016-11-27 DIAGNOSIS — E118 Type 2 diabetes mellitus with unspecified complications: Secondary | ICD-10-CM | POA: Diagnosis not present

## 2016-11-27 DIAGNOSIS — D631 Anemia in chronic kidney disease: Secondary | ICD-10-CM | POA: Diagnosis not present

## 2016-11-27 DIAGNOSIS — N2581 Secondary hyperparathyroidism of renal origin: Secondary | ICD-10-CM | POA: Diagnosis not present

## 2016-11-30 DIAGNOSIS — D631 Anemia in chronic kidney disease: Secondary | ICD-10-CM | POA: Diagnosis not present

## 2016-11-30 DIAGNOSIS — N2581 Secondary hyperparathyroidism of renal origin: Secondary | ICD-10-CM | POA: Diagnosis not present

## 2016-11-30 DIAGNOSIS — E118 Type 2 diabetes mellitus with unspecified complications: Secondary | ICD-10-CM | POA: Diagnosis not present

## 2016-11-30 DIAGNOSIS — N186 End stage renal disease: Secondary | ICD-10-CM | POA: Diagnosis not present

## 2016-12-02 DIAGNOSIS — I871 Compression of vein: Secondary | ICD-10-CM | POA: Diagnosis not present

## 2016-12-02 DIAGNOSIS — T82858A Stenosis of vascular prosthetic devices, implants and grafts, initial encounter: Secondary | ICD-10-CM | POA: Diagnosis not present

## 2016-12-02 DIAGNOSIS — Z992 Dependence on renal dialysis: Secondary | ICD-10-CM | POA: Diagnosis not present

## 2016-12-02 DIAGNOSIS — N186 End stage renal disease: Secondary | ICD-10-CM | POA: Diagnosis not present

## 2016-12-04 DIAGNOSIS — N2581 Secondary hyperparathyroidism of renal origin: Secondary | ICD-10-CM | POA: Diagnosis not present

## 2016-12-04 DIAGNOSIS — E118 Type 2 diabetes mellitus with unspecified complications: Secondary | ICD-10-CM | POA: Diagnosis not present

## 2016-12-04 DIAGNOSIS — D631 Anemia in chronic kidney disease: Secondary | ICD-10-CM | POA: Diagnosis not present

## 2016-12-04 DIAGNOSIS — N186 End stage renal disease: Secondary | ICD-10-CM | POA: Diagnosis not present

## 2016-12-05 DIAGNOSIS — N041 Nephrotic syndrome with focal and segmental glomerular lesions: Secondary | ICD-10-CM | POA: Diagnosis not present

## 2016-12-05 DIAGNOSIS — Z992 Dependence on renal dialysis: Secondary | ICD-10-CM | POA: Diagnosis not present

## 2016-12-05 DIAGNOSIS — N186 End stage renal disease: Secondary | ICD-10-CM | POA: Diagnosis not present

## 2016-12-07 DIAGNOSIS — D631 Anemia in chronic kidney disease: Secondary | ICD-10-CM | POA: Diagnosis not present

## 2016-12-07 DIAGNOSIS — N2581 Secondary hyperparathyroidism of renal origin: Secondary | ICD-10-CM | POA: Diagnosis not present

## 2016-12-07 DIAGNOSIS — E118 Type 2 diabetes mellitus with unspecified complications: Secondary | ICD-10-CM | POA: Diagnosis not present

## 2016-12-07 DIAGNOSIS — N186 End stage renal disease: Secondary | ICD-10-CM | POA: Diagnosis not present

## 2016-12-09 DIAGNOSIS — D631 Anemia in chronic kidney disease: Secondary | ICD-10-CM | POA: Diagnosis not present

## 2016-12-09 DIAGNOSIS — N2581 Secondary hyperparathyroidism of renal origin: Secondary | ICD-10-CM | POA: Diagnosis not present

## 2016-12-09 DIAGNOSIS — N186 End stage renal disease: Secondary | ICD-10-CM | POA: Diagnosis not present

## 2016-12-09 DIAGNOSIS — E118 Type 2 diabetes mellitus with unspecified complications: Secondary | ICD-10-CM | POA: Diagnosis not present

## 2016-12-11 DIAGNOSIS — N2581 Secondary hyperparathyroidism of renal origin: Secondary | ICD-10-CM | POA: Diagnosis not present

## 2016-12-11 DIAGNOSIS — D631 Anemia in chronic kidney disease: Secondary | ICD-10-CM | POA: Diagnosis not present

## 2016-12-11 DIAGNOSIS — N186 End stage renal disease: Secondary | ICD-10-CM | POA: Diagnosis not present

## 2016-12-11 DIAGNOSIS — E118 Type 2 diabetes mellitus with unspecified complications: Secondary | ICD-10-CM | POA: Diagnosis not present

## 2016-12-14 DIAGNOSIS — N186 End stage renal disease: Secondary | ICD-10-CM | POA: Diagnosis not present

## 2016-12-14 DIAGNOSIS — E118 Type 2 diabetes mellitus with unspecified complications: Secondary | ICD-10-CM | POA: Diagnosis not present

## 2016-12-14 DIAGNOSIS — D631 Anemia in chronic kidney disease: Secondary | ICD-10-CM | POA: Diagnosis not present

## 2016-12-14 DIAGNOSIS — N2581 Secondary hyperparathyroidism of renal origin: Secondary | ICD-10-CM | POA: Diagnosis not present

## 2016-12-16 DIAGNOSIS — E118 Type 2 diabetes mellitus with unspecified complications: Secondary | ICD-10-CM | POA: Diagnosis not present

## 2016-12-16 DIAGNOSIS — N2581 Secondary hyperparathyroidism of renal origin: Secondary | ICD-10-CM | POA: Diagnosis not present

## 2016-12-16 DIAGNOSIS — N186 End stage renal disease: Secondary | ICD-10-CM | POA: Diagnosis not present

## 2016-12-16 DIAGNOSIS — D631 Anemia in chronic kidney disease: Secondary | ICD-10-CM | POA: Diagnosis not present

## 2016-12-18 DIAGNOSIS — N2581 Secondary hyperparathyroidism of renal origin: Secondary | ICD-10-CM | POA: Diagnosis not present

## 2016-12-18 DIAGNOSIS — D631 Anemia in chronic kidney disease: Secondary | ICD-10-CM | POA: Diagnosis not present

## 2016-12-18 DIAGNOSIS — E118 Type 2 diabetes mellitus with unspecified complications: Secondary | ICD-10-CM | POA: Diagnosis not present

## 2016-12-18 DIAGNOSIS — N186 End stage renal disease: Secondary | ICD-10-CM | POA: Diagnosis not present

## 2016-12-21 DIAGNOSIS — E118 Type 2 diabetes mellitus with unspecified complications: Secondary | ICD-10-CM | POA: Diagnosis not present

## 2016-12-21 DIAGNOSIS — N186 End stage renal disease: Secondary | ICD-10-CM | POA: Diagnosis not present

## 2016-12-21 DIAGNOSIS — N2581 Secondary hyperparathyroidism of renal origin: Secondary | ICD-10-CM | POA: Diagnosis not present

## 2016-12-21 DIAGNOSIS — D631 Anemia in chronic kidney disease: Secondary | ICD-10-CM | POA: Diagnosis not present

## 2016-12-23 DIAGNOSIS — D631 Anemia in chronic kidney disease: Secondary | ICD-10-CM | POA: Diagnosis not present

## 2016-12-23 DIAGNOSIS — E118 Type 2 diabetes mellitus with unspecified complications: Secondary | ICD-10-CM | POA: Diagnosis not present

## 2016-12-23 DIAGNOSIS — N2581 Secondary hyperparathyroidism of renal origin: Secondary | ICD-10-CM | POA: Diagnosis not present

## 2016-12-23 DIAGNOSIS — N186 End stage renal disease: Secondary | ICD-10-CM | POA: Diagnosis not present

## 2016-12-25 DIAGNOSIS — N2581 Secondary hyperparathyroidism of renal origin: Secondary | ICD-10-CM | POA: Diagnosis not present

## 2016-12-25 DIAGNOSIS — D631 Anemia in chronic kidney disease: Secondary | ICD-10-CM | POA: Diagnosis not present

## 2016-12-25 DIAGNOSIS — E118 Type 2 diabetes mellitus with unspecified complications: Secondary | ICD-10-CM | POA: Diagnosis not present

## 2016-12-25 DIAGNOSIS — N186 End stage renal disease: Secondary | ICD-10-CM | POA: Diagnosis not present

## 2016-12-28 DIAGNOSIS — N186 End stage renal disease: Secondary | ICD-10-CM | POA: Diagnosis not present

## 2016-12-28 DIAGNOSIS — E118 Type 2 diabetes mellitus with unspecified complications: Secondary | ICD-10-CM | POA: Diagnosis not present

## 2016-12-28 DIAGNOSIS — N2581 Secondary hyperparathyroidism of renal origin: Secondary | ICD-10-CM | POA: Diagnosis not present

## 2016-12-28 DIAGNOSIS — D631 Anemia in chronic kidney disease: Secondary | ICD-10-CM | POA: Diagnosis not present

## 2016-12-30 DIAGNOSIS — D631 Anemia in chronic kidney disease: Secondary | ICD-10-CM | POA: Diagnosis not present

## 2016-12-30 DIAGNOSIS — N186 End stage renal disease: Secondary | ICD-10-CM | POA: Diagnosis not present

## 2016-12-30 DIAGNOSIS — E118 Type 2 diabetes mellitus with unspecified complications: Secondary | ICD-10-CM | POA: Diagnosis not present

## 2016-12-30 DIAGNOSIS — N2581 Secondary hyperparathyroidism of renal origin: Secondary | ICD-10-CM | POA: Diagnosis not present

## 2017-01-01 DIAGNOSIS — D631 Anemia in chronic kidney disease: Secondary | ICD-10-CM | POA: Diagnosis not present

## 2017-01-01 DIAGNOSIS — N2581 Secondary hyperparathyroidism of renal origin: Secondary | ICD-10-CM | POA: Diagnosis not present

## 2017-01-01 DIAGNOSIS — N186 End stage renal disease: Secondary | ICD-10-CM | POA: Diagnosis not present

## 2017-01-01 DIAGNOSIS — E118 Type 2 diabetes mellitus with unspecified complications: Secondary | ICD-10-CM | POA: Diagnosis not present

## 2017-01-04 DIAGNOSIS — E118 Type 2 diabetes mellitus with unspecified complications: Secondary | ICD-10-CM | POA: Diagnosis not present

## 2017-01-04 DIAGNOSIS — N186 End stage renal disease: Secondary | ICD-10-CM | POA: Diagnosis not present

## 2017-01-04 DIAGNOSIS — N2581 Secondary hyperparathyroidism of renal origin: Secondary | ICD-10-CM | POA: Diagnosis not present

## 2017-01-04 DIAGNOSIS — D631 Anemia in chronic kidney disease: Secondary | ICD-10-CM | POA: Diagnosis not present

## 2017-01-05 DIAGNOSIS — N186 End stage renal disease: Secondary | ICD-10-CM | POA: Diagnosis not present

## 2017-01-05 DIAGNOSIS — Z992 Dependence on renal dialysis: Secondary | ICD-10-CM | POA: Diagnosis not present

## 2017-01-05 DIAGNOSIS — N041 Nephrotic syndrome with focal and segmental glomerular lesions: Secondary | ICD-10-CM | POA: Diagnosis not present

## 2017-01-06 ENCOUNTER — Telehealth: Payer: Self-pay | Admitting: Family Medicine

## 2017-01-06 DIAGNOSIS — N186 End stage renal disease: Secondary | ICD-10-CM | POA: Diagnosis not present

## 2017-01-06 DIAGNOSIS — D631 Anemia in chronic kidney disease: Secondary | ICD-10-CM | POA: Diagnosis not present

## 2017-01-06 DIAGNOSIS — E118 Type 2 diabetes mellitus with unspecified complications: Secondary | ICD-10-CM | POA: Diagnosis not present

## 2017-01-06 DIAGNOSIS — N2581 Secondary hyperparathyroidism of renal origin: Secondary | ICD-10-CM | POA: Diagnosis not present

## 2017-01-06 NOTE — Telephone Encounter (Signed)
Last A1C 12.2 on 07/01/16

## 2017-01-07 NOTE — Telephone Encounter (Signed)
Okay to send same dose and directions to [facility]

## 2017-01-08 DIAGNOSIS — E118 Type 2 diabetes mellitus with unspecified complications: Secondary | ICD-10-CM | POA: Diagnosis not present

## 2017-01-08 DIAGNOSIS — N2581 Secondary hyperparathyroidism of renal origin: Secondary | ICD-10-CM | POA: Diagnosis not present

## 2017-01-08 DIAGNOSIS — N186 End stage renal disease: Secondary | ICD-10-CM | POA: Diagnosis not present

## 2017-01-08 DIAGNOSIS — D631 Anemia in chronic kidney disease: Secondary | ICD-10-CM | POA: Diagnosis not present

## 2017-01-10 MED ORDER — INSULIN DETEMIR 100 UNIT/ML ~~LOC~~ SOLN: 40.0000 [IU] | Freq: Every day | SUBCUTANEOUS | 0 refills | Status: DC

## 2017-01-10 NOTE — Telephone Encounter (Signed)
Refill sent to pharmacy & appt made for next Friday

## 2017-01-11 DIAGNOSIS — N186 End stage renal disease: Secondary | ICD-10-CM | POA: Diagnosis not present

## 2017-01-11 DIAGNOSIS — E118 Type 2 diabetes mellitus with unspecified complications: Secondary | ICD-10-CM | POA: Diagnosis not present

## 2017-01-11 DIAGNOSIS — N2581 Secondary hyperparathyroidism of renal origin: Secondary | ICD-10-CM | POA: Diagnosis not present

## 2017-01-11 DIAGNOSIS — D631 Anemia in chronic kidney disease: Secondary | ICD-10-CM | POA: Diagnosis not present

## 2017-01-13 DIAGNOSIS — D631 Anemia in chronic kidney disease: Secondary | ICD-10-CM | POA: Diagnosis not present

## 2017-01-13 DIAGNOSIS — N186 End stage renal disease: Secondary | ICD-10-CM | POA: Diagnosis not present

## 2017-01-13 DIAGNOSIS — E118 Type 2 diabetes mellitus with unspecified complications: Secondary | ICD-10-CM | POA: Diagnosis not present

## 2017-01-13 DIAGNOSIS — N2581 Secondary hyperparathyroidism of renal origin: Secondary | ICD-10-CM | POA: Diagnosis not present

## 2017-01-15 DIAGNOSIS — N2581 Secondary hyperparathyroidism of renal origin: Secondary | ICD-10-CM | POA: Diagnosis not present

## 2017-01-15 DIAGNOSIS — E118 Type 2 diabetes mellitus with unspecified complications: Secondary | ICD-10-CM | POA: Diagnosis not present

## 2017-01-15 DIAGNOSIS — D631 Anemia in chronic kidney disease: Secondary | ICD-10-CM | POA: Diagnosis not present

## 2017-01-15 DIAGNOSIS — N186 End stage renal disease: Secondary | ICD-10-CM | POA: Diagnosis not present

## 2017-01-18 DIAGNOSIS — E118 Type 2 diabetes mellitus with unspecified complications: Secondary | ICD-10-CM | POA: Diagnosis not present

## 2017-01-18 DIAGNOSIS — N2581 Secondary hyperparathyroidism of renal origin: Secondary | ICD-10-CM | POA: Diagnosis not present

## 2017-01-18 DIAGNOSIS — N186 End stage renal disease: Secondary | ICD-10-CM | POA: Diagnosis not present

## 2017-01-18 DIAGNOSIS — D631 Anemia in chronic kidney disease: Secondary | ICD-10-CM | POA: Diagnosis not present

## 2017-01-20 DIAGNOSIS — N186 End stage renal disease: Secondary | ICD-10-CM | POA: Diagnosis not present

## 2017-01-20 DIAGNOSIS — E118 Type 2 diabetes mellitus with unspecified complications: Secondary | ICD-10-CM | POA: Diagnosis not present

## 2017-01-20 DIAGNOSIS — N2581 Secondary hyperparathyroidism of renal origin: Secondary | ICD-10-CM | POA: Diagnosis not present

## 2017-01-20 DIAGNOSIS — D631 Anemia in chronic kidney disease: Secondary | ICD-10-CM | POA: Diagnosis not present

## 2017-01-20 NOTE — Progress Notes (Deleted)
Subjective:    Patient ID: James [name], male    DOB: 1956-01-21, 61 y.o.   MRN: [id number]  HPI    Review of Systems     Objective:   Physical Exam        Assessment & Plan:

## 2017-01-21 ENCOUNTER — Ambulatory Visit: Payer: Medicare Other | Admitting: Family Medicine

## 2017-01-22 DIAGNOSIS — D631 Anemia in chronic kidney disease: Secondary | ICD-10-CM | POA: Diagnosis not present

## 2017-01-22 DIAGNOSIS — N186 End stage renal disease: Secondary | ICD-10-CM | POA: Diagnosis not present

## 2017-01-22 DIAGNOSIS — N2581 Secondary hyperparathyroidism of renal origin: Secondary | ICD-10-CM | POA: Diagnosis not present

## 2017-01-22 DIAGNOSIS — E118 Type 2 diabetes mellitus with unspecified complications: Secondary | ICD-10-CM | POA: Diagnosis not present

## 2017-01-24 ENCOUNTER — Encounter: Payer: Self-pay | Admitting: Family Medicine

## 2017-01-24 ENCOUNTER — Telehealth: Payer: Self-pay | Admitting: Family Medicine

## 2017-01-24 NOTE — Telephone Encounter (Signed)
3 mos rck rescheduled for this Wed. Pt has dialysis.

## 2017-01-25 DIAGNOSIS — D631 Anemia in chronic kidney disease: Secondary | ICD-10-CM | POA: Diagnosis not present

## 2017-01-25 DIAGNOSIS — E118 Type 2 diabetes mellitus with unspecified complications: Secondary | ICD-10-CM | POA: Diagnosis not present

## 2017-01-25 DIAGNOSIS — N2581 Secondary hyperparathyroidism of renal origin: Secondary | ICD-10-CM | POA: Diagnosis not present

## 2017-01-25 DIAGNOSIS — N186 End stage renal disease: Secondary | ICD-10-CM | POA: Diagnosis not present

## 2017-01-26 ENCOUNTER — Encounter: Payer: Self-pay | Admitting: Family Medicine

## 2017-01-26 ENCOUNTER — Ambulatory Visit (INDEPENDENT_AMBULATORY_CARE_PROVIDER_SITE_OTHER): Payer: Medicare Other | Admitting: Family Medicine

## 2017-01-26 VITALS — BP 109/62 | HR 86 | Temp 98.2°F | Ht 65.0 in | Wt 201.0 lb

## 2017-01-26 DIAGNOSIS — Z794 Long term (current) use of insulin: Secondary | ICD-10-CM | POA: Diagnosis not present

## 2017-01-26 DIAGNOSIS — I10 Essential (primary) hypertension: Secondary | ICD-10-CM | POA: Diagnosis not present

## 2017-01-26 DIAGNOSIS — E118 Type 2 diabetes mellitus with unspecified complications: Secondary | ICD-10-CM | POA: Diagnosis not present

## 2017-01-26 LAB — BAYER DCA HB A1C WAIVED: HB A1C: 6.2 % (ref <7.0)

## 2017-01-26 NOTE — Progress Notes (Signed)
 Subjective:    Patient ID: James [name], male    DOB: 04/05/1956, 61 y.o.   MRN: [id number]  HPI Patient here today for follow up on diabetes and hypertension. He is taking medication regularly. He is on dialysis 3 times a week. He tells me they keep up with his blood counts. He's had no recent chest pain no shortness of breath besides supplements that he takes on dialysis he is on Aranesp, Neurontin, insulin, and Imdur  Patient Active Problem List   Diagnosis Date Noted  . Staphylococcus aureus bacteremia 08/04/2016  . Severe sepsis (HCC) 08/03/2016  . Anemia associated with chronic renal failure 08/03/2016  . Chronic systolic heart failure (HCC) 08/03/2016  . Hyponatremia 08/03/2016  . Elevated troponin 08/03/2016  . Multiple myeloma (HCC)   . HTN (hypertension) 03/22/2015  . DM (diabetes mellitus) (HCC) 03/22/2015  . ESRD on dialysis (HCC) 03/22/2015  . Anemia 03/22/2015   Outpatient Encounter Prescriptions as of 01/26/2017  Medication Sig  . calcium acetate (PHOSLO) 667 MG capsule Take 2 capsules (1,334 mg total) by mouth 3 (three) times daily with meals.  . ceFAZolin (ANCEF) 2-4 GM/100ML-% IVPB Inject 100 mLs (2 g total) into the vein Every Tuesday,Thursday,and Saturday with dialysis.  . Darbepoetin Alfa (ARANESP) 100 MCG/0.5ML SOSY injection Inject 0.5 mLs (100 mcg total) into the vein every Thursday with hemodialysis. Will be given at dialysis.  . gabapentin (NEURONTIN) 300 MG capsule Take 1 capsule (300 mg total) by mouth at bedtime.  . glucose blood (ONETOUCH VERIO) test strip Test three times daily  . insulin aspart (NOVOLOG) 100 UNIT/ML injection Inject 6 Units into the skin 3 (three) times daily before meals.  . insulin detemir (LEVEMIR) 100 UNIT/ML injection Inject 0.4 mLs (40 Units total) into the skin at bedtime.  . isosorbide mononitrate (IMDUR) 60 MG 24 hr tablet Take 1 tablet (60 mg total) by mouth daily.  . Vitamin D, Ergocalciferol, (DRISDOL) 50000 units CAPS  capsule Take 50,000 Units by mouth every 7 (seven) days. Monday   No facility-administered encounter medications on file as of 01/26/2017.       Review of Systems  Constitutional: Negative.   HENT: Negative.   Eyes: Negative.   Respiratory: Negative.   Cardiovascular: Negative.   Gastrointestinal: Negative.   Endocrine: Negative.   Genitourinary: Negative.   Musculoskeletal: Negative.   Skin: Negative.   Allergic/Immunologic: Negative.   Neurological: Negative.   Hematological: Negative.   Psychiatric/Behavioral: Negative.        Objective:   Physical Exam  Constitutional: He is oriented to person, place, and time. He appears well-developed and well-nourished.  Cardiovascular: Normal rate, regular rhythm and normal heart sounds.   Pulmonary/Chest: Effort normal and breath sounds normal.  Neurological: He is alert and oriented to person, place, and time.  Psychiatric: He has a normal mood and affect. His behavior is normal.   BP 109/62 (BP Location: Right Arm)   Pulse 86   Temp 98.2 F (36.8 C) (Oral)   Ht 5' 5" (1.651 m)   Wt 201 lb (91.2 kg)   BMI 33.45 kg/m         Assessment & Plan:  1. Essential hypertension Blood pressure is good. He is on no medicine but dialysis helps regulate.  2. Type 2 diabetes mellitus with complication, with long-term current use of insulin (HCC) Sugars at home when he checks have been generally good. I do not see a recent A1c in his record - Bayer DCA Hb A1c    Subjective:    Patient ID: James [name], male    DOB: 10/31/1956, 61 y.o.   MRN: [id number]  HPI Patient here today for follow up on diabetes and hypertension. He is taking medication regularly. He is on dialysis 3 times a week. He tells me they keep up with his blood counts. He's had no recent chest pain no shortness of breath besides supplements that he takes on dialysis he is on Aranesp, Neurontin, insulin, and Imdur  Patient Active Problem List   Diagnosis Date Noted  . Staphylococcus aureus bacteremia 08/04/2016  . Severe sepsis (HCC) 08/03/2016  . Anemia associated with chronic renal failure 08/03/2016  . Chronic systolic heart failure (HCC) 08/03/2016  . Hyponatremia 08/03/2016  . Elevated troponin 08/03/2016  . Multiple myeloma (HCC)   . HTN (hypertension) 03/22/2015  . DM (diabetes mellitus) (HCC) 03/22/2015  . ESRD on dialysis (HCC) 03/22/2015  . Anemia 03/22/2015   Outpatient Encounter Prescriptions as of 01/26/2017  Medication Sig  . calcium acetate (PHOSLO) 667 MG capsule Take 2 capsules (1,334 mg total) by mouth 3 (three) times daily with meals.  . ceFAZolin (ANCEF) 2-4 GM/100ML-% IVPB Inject 100 mLs (2 g total) into the vein Every Tuesday,Thursday,and Saturday with dialysis.  . Darbepoetin Alfa (ARANESP) 100 MCG/0.5ML SOSY injection Inject 0.5 mLs (100 mcg total) into the vein every Thursday with hemodialysis. Will be given at dialysis.  . gabapentin (NEURONTIN) 300 MG capsule Take 1 capsule (300 mg total) by mouth at bedtime.  . glucose blood (ONETOUCH VERIO) test strip Test three times daily  . insulin aspart (NOVOLOG) 100 UNIT/ML injection Inject 6 Units into the skin 3 (three) times daily before meals.  . insulin detemir (LEVEMIR) 100 UNIT/ML injection Inject 0.4 mLs (40 Units total) into the skin at bedtime.  . isosorbide mononitrate (IMDUR) 60 MG 24 hr tablet Take 1 tablet (60 mg total) by mouth daily.  . Vitamin D, Ergocalciferol, (DRISDOL) 50000 units CAPS  capsule Take 50,000 Units by mouth every 7 (seven) days. Monday   No facility-administered encounter medications on file as of 01/26/2017.       Review of Systems  Constitutional: Negative.   HENT: Negative.   Eyes: Negative.   Respiratory: Negative.   Cardiovascular: Negative.   Gastrointestinal: Negative.   Endocrine: Negative.   Genitourinary: Negative.   Musculoskeletal: Negative.   Skin: Negative.   Allergic/Immunologic: Negative.   Neurological: Negative.   Hematological: Negative.   Psychiatric/Behavioral: Negative.        Objective:   Physical Exam  Constitutional: He is oriented to person, place, and time. He appears well-developed and well-nourished.  Cardiovascular: Normal rate, regular rhythm and normal heart sounds.   Pulmonary/Chest: Effort normal and breath sounds normal.  Neurological: He is alert and oriented to person, place, and time.  Psychiatric: He has a normal mood and affect. His behavior is normal.   BP 109/62 (BP Location: Right Arm)   Pulse 86   Temp 98.2 F (36.8 C) (Oral)   Ht 5' 5" (1.651 m)   Wt 201 lb (91.2 kg)   BMI 33.45 kg/m         Assessment & Plan:  1. Essential hypertension Blood pressure is good. He is on no medicine but dialysis helps regulate.  2. Type 2 diabetes mellitus with complication, with long-term current use of insulin (HCC) Sugars at home when he checks have been generally good. I do not see a recent A1c in his record - Bayer DCA Hb A1c

## 2017-01-26 NOTE — Addendum Note (Signed)
Addended by: Zannie Cove on: 01/26/2017 03:40 PM   Modules accepted: Orders

## 2017-01-26 NOTE — Patient Instructions (Signed)
Continue current medications. Continue good therapeutic lifestyle changes which include good diet and exercise. Fall precautions discussed with patient. If an FOBT was given today- please return it to our front desk. If you are over 59 years old - you may need Prevnar 13 or the adult Pneumonia vaccine.  **Flu shots are available--- please call and schedule a FLU-CLINIC appointment**  After your visit with us today you will receive a survey in the mail or online from [employer] regarding your care with us. Please take a moment to fill this out. Your feedback is very important to us as you can help us better understand your patient needs as well as improve your experience and satisfaction. WE CARE ABOUT YOU!!!    

## 2017-01-27 DIAGNOSIS — N2581 Secondary hyperparathyroidism of renal origin: Secondary | ICD-10-CM | POA: Diagnosis not present

## 2017-01-27 DIAGNOSIS — E118 Type 2 diabetes mellitus with unspecified complications: Secondary | ICD-10-CM | POA: Diagnosis not present

## 2017-01-27 DIAGNOSIS — N186 End stage renal disease: Secondary | ICD-10-CM | POA: Diagnosis not present

## 2017-01-27 DIAGNOSIS — D631 Anemia in chronic kidney disease: Secondary | ICD-10-CM | POA: Diagnosis not present

## 2017-01-29 DIAGNOSIS — D631 Anemia in chronic kidney disease: Secondary | ICD-10-CM | POA: Diagnosis not present

## 2017-01-29 DIAGNOSIS — N186 End stage renal disease: Secondary | ICD-10-CM | POA: Diagnosis not present

## 2017-01-29 DIAGNOSIS — N2581 Secondary hyperparathyroidism of renal origin: Secondary | ICD-10-CM | POA: Diagnosis not present

## 2017-01-29 DIAGNOSIS — E118 Type 2 diabetes mellitus with unspecified complications: Secondary | ICD-10-CM | POA: Diagnosis not present

## 2017-02-01 DIAGNOSIS — D631 Anemia in chronic kidney disease: Secondary | ICD-10-CM | POA: Diagnosis not present

## 2017-02-01 DIAGNOSIS — N2581 Secondary hyperparathyroidism of renal origin: Secondary | ICD-10-CM | POA: Diagnosis not present

## 2017-02-01 DIAGNOSIS — N186 End stage renal disease: Secondary | ICD-10-CM | POA: Diagnosis not present

## 2017-02-01 DIAGNOSIS — E118 Type 2 diabetes mellitus with unspecified complications: Secondary | ICD-10-CM | POA: Diagnosis not present

## 2017-02-02 DIAGNOSIS — N041 Nephrotic syndrome with focal and segmental glomerular lesions: Secondary | ICD-10-CM | POA: Diagnosis not present

## 2017-02-02 DIAGNOSIS — Z992 Dependence on renal dialysis: Secondary | ICD-10-CM | POA: Diagnosis not present

## 2017-02-02 DIAGNOSIS — N186 End stage renal disease: Secondary | ICD-10-CM | POA: Diagnosis not present

## 2017-02-03 DIAGNOSIS — D631 Anemia in chronic kidney disease: Secondary | ICD-10-CM | POA: Diagnosis not present

## 2017-02-03 DIAGNOSIS — N2581 Secondary hyperparathyroidism of renal origin: Secondary | ICD-10-CM | POA: Diagnosis not present

## 2017-02-03 DIAGNOSIS — N186 End stage renal disease: Secondary | ICD-10-CM | POA: Diagnosis not present

## 2017-02-05 DIAGNOSIS — D631 Anemia in chronic kidney disease: Secondary | ICD-10-CM | POA: Diagnosis not present

## 2017-02-05 DIAGNOSIS — N186 End stage renal disease: Secondary | ICD-10-CM | POA: Diagnosis not present

## 2017-02-05 DIAGNOSIS — N2581 Secondary hyperparathyroidism of renal origin: Secondary | ICD-10-CM | POA: Diagnosis not present

## 2017-02-08 DIAGNOSIS — D631 Anemia in chronic kidney disease: Secondary | ICD-10-CM | POA: Diagnosis not present

## 2017-02-08 DIAGNOSIS — N186 End stage renal disease: Secondary | ICD-10-CM | POA: Diagnosis not present

## 2017-02-08 DIAGNOSIS — N2581 Secondary hyperparathyroidism of renal origin: Secondary | ICD-10-CM | POA: Diagnosis not present

## 2017-02-10 DIAGNOSIS — D631 Anemia in chronic kidney disease: Secondary | ICD-10-CM | POA: Diagnosis not present

## 2017-02-10 DIAGNOSIS — N2581 Secondary hyperparathyroidism of renal origin: Secondary | ICD-10-CM | POA: Diagnosis not present

## 2017-02-10 DIAGNOSIS — N186 End stage renal disease: Secondary | ICD-10-CM | POA: Diagnosis not present

## 2017-02-12 DIAGNOSIS — D631 Anemia in chronic kidney disease: Secondary | ICD-10-CM | POA: Diagnosis not present

## 2017-02-12 DIAGNOSIS — N2581 Secondary hyperparathyroidism of renal origin: Secondary | ICD-10-CM | POA: Diagnosis not present

## 2017-02-12 DIAGNOSIS — N186 End stage renal disease: Secondary | ICD-10-CM | POA: Diagnosis not present

## 2017-02-15 DIAGNOSIS — N186 End stage renal disease: Secondary | ICD-10-CM | POA: Diagnosis not present

## 2017-02-15 DIAGNOSIS — D631 Anemia in chronic kidney disease: Secondary | ICD-10-CM | POA: Diagnosis not present

## 2017-02-15 DIAGNOSIS — N2581 Secondary hyperparathyroidism of renal origin: Secondary | ICD-10-CM | POA: Diagnosis not present

## 2017-02-17 DIAGNOSIS — N186 End stage renal disease: Secondary | ICD-10-CM | POA: Diagnosis not present

## 2017-02-17 DIAGNOSIS — D631 Anemia in chronic kidney disease: Secondary | ICD-10-CM | POA: Diagnosis not present

## 2017-02-17 DIAGNOSIS — N2581 Secondary hyperparathyroidism of renal origin: Secondary | ICD-10-CM | POA: Diagnosis not present

## 2017-02-19 DIAGNOSIS — N186 End stage renal disease: Secondary | ICD-10-CM | POA: Diagnosis not present

## 2017-02-19 DIAGNOSIS — N2581 Secondary hyperparathyroidism of renal origin: Secondary | ICD-10-CM | POA: Diagnosis not present

## 2017-02-19 DIAGNOSIS — D631 Anemia in chronic kidney disease: Secondary | ICD-10-CM | POA: Diagnosis not present

## 2017-02-22 DIAGNOSIS — D631 Anemia in chronic kidney disease: Secondary | ICD-10-CM | POA: Diagnosis not present

## 2017-02-22 DIAGNOSIS — N186 End stage renal disease: Secondary | ICD-10-CM | POA: Diagnosis not present

## 2017-02-22 DIAGNOSIS — N2581 Secondary hyperparathyroidism of renal origin: Secondary | ICD-10-CM | POA: Diagnosis not present

## 2017-02-24 DIAGNOSIS — N2581 Secondary hyperparathyroidism of renal origin: Secondary | ICD-10-CM | POA: Diagnosis not present

## 2017-02-24 DIAGNOSIS — D631 Anemia in chronic kidney disease: Secondary | ICD-10-CM | POA: Diagnosis not present

## 2017-02-24 DIAGNOSIS — N186 End stage renal disease: Secondary | ICD-10-CM | POA: Diagnosis not present

## 2017-02-26 DIAGNOSIS — D631 Anemia in chronic kidney disease: Secondary | ICD-10-CM | POA: Diagnosis not present

## 2017-02-26 DIAGNOSIS — N186 End stage renal disease: Secondary | ICD-10-CM | POA: Diagnosis not present

## 2017-02-26 DIAGNOSIS — N2581 Secondary hyperparathyroidism of renal origin: Secondary | ICD-10-CM | POA: Diagnosis not present

## 2017-03-01 DIAGNOSIS — N186 End stage renal disease: Secondary | ICD-10-CM | POA: Diagnosis not present

## 2017-03-01 DIAGNOSIS — N2581 Secondary hyperparathyroidism of renal origin: Secondary | ICD-10-CM | POA: Diagnosis not present

## 2017-03-01 DIAGNOSIS — D631 Anemia in chronic kidney disease: Secondary | ICD-10-CM | POA: Diagnosis not present

## 2017-03-03 DIAGNOSIS — N2581 Secondary hyperparathyroidism of renal origin: Secondary | ICD-10-CM | POA: Diagnosis not present

## 2017-03-03 DIAGNOSIS — D631 Anemia in chronic kidney disease: Secondary | ICD-10-CM | POA: Diagnosis not present

## 2017-03-03 DIAGNOSIS — N186 End stage renal disease: Secondary | ICD-10-CM | POA: Diagnosis not present

## 2017-03-05 DIAGNOSIS — N186 End stage renal disease: Secondary | ICD-10-CM | POA: Diagnosis not present

## 2017-03-05 DIAGNOSIS — N2581 Secondary hyperparathyroidism of renal origin: Secondary | ICD-10-CM | POA: Diagnosis not present

## 2017-03-05 DIAGNOSIS — D631 Anemia in chronic kidney disease: Secondary | ICD-10-CM | POA: Diagnosis not present

## 2017-03-05 DIAGNOSIS — N041 Nephrotic syndrome with focal and segmental glomerular lesions: Secondary | ICD-10-CM | POA: Diagnosis not present

## 2017-03-05 DIAGNOSIS — Z992 Dependence on renal dialysis: Secondary | ICD-10-CM | POA: Diagnosis not present

## 2017-03-08 DIAGNOSIS — N2581 Secondary hyperparathyroidism of renal origin: Secondary | ICD-10-CM | POA: Diagnosis not present

## 2017-03-08 DIAGNOSIS — N186 End stage renal disease: Secondary | ICD-10-CM | POA: Diagnosis not present

## 2017-03-08 DIAGNOSIS — D631 Anemia in chronic kidney disease: Secondary | ICD-10-CM | POA: Diagnosis not present

## 2017-03-08 DIAGNOSIS — E118 Type 2 diabetes mellitus with unspecified complications: Secondary | ICD-10-CM | POA: Diagnosis not present

## 2017-03-10 DIAGNOSIS — N2581 Secondary hyperparathyroidism of renal origin: Secondary | ICD-10-CM | POA: Diagnosis not present

## 2017-03-10 DIAGNOSIS — N186 End stage renal disease: Secondary | ICD-10-CM | POA: Diagnosis not present

## 2017-03-10 DIAGNOSIS — D631 Anemia in chronic kidney disease: Secondary | ICD-10-CM | POA: Diagnosis not present

## 2017-03-10 DIAGNOSIS — E118 Type 2 diabetes mellitus with unspecified complications: Secondary | ICD-10-CM | POA: Diagnosis not present

## 2017-03-12 DIAGNOSIS — N186 End stage renal disease: Secondary | ICD-10-CM | POA: Diagnosis not present

## 2017-03-12 DIAGNOSIS — D631 Anemia in chronic kidney disease: Secondary | ICD-10-CM | POA: Diagnosis not present

## 2017-03-12 DIAGNOSIS — N2581 Secondary hyperparathyroidism of renal origin: Secondary | ICD-10-CM | POA: Diagnosis not present

## 2017-03-12 DIAGNOSIS — E118 Type 2 diabetes mellitus with unspecified complications: Secondary | ICD-10-CM | POA: Diagnosis not present

## 2017-03-15 DIAGNOSIS — D631 Anemia in chronic kidney disease: Secondary | ICD-10-CM | POA: Diagnosis not present

## 2017-03-15 DIAGNOSIS — N186 End stage renal disease: Secondary | ICD-10-CM | POA: Diagnosis not present

## 2017-03-15 DIAGNOSIS — E118 Type 2 diabetes mellitus with unspecified complications: Secondary | ICD-10-CM | POA: Diagnosis not present

## 2017-03-15 DIAGNOSIS — N2581 Secondary hyperparathyroidism of renal origin: Secondary | ICD-10-CM | POA: Diagnosis not present

## 2017-03-17 DIAGNOSIS — N2581 Secondary hyperparathyroidism of renal origin: Secondary | ICD-10-CM | POA: Diagnosis not present

## 2017-03-17 DIAGNOSIS — E118 Type 2 diabetes mellitus with unspecified complications: Secondary | ICD-10-CM | POA: Diagnosis not present

## 2017-03-17 DIAGNOSIS — D631 Anemia in chronic kidney disease: Secondary | ICD-10-CM | POA: Diagnosis not present

## 2017-03-17 DIAGNOSIS — N186 End stage renal disease: Secondary | ICD-10-CM | POA: Diagnosis not present

## 2017-03-19 DIAGNOSIS — N2581 Secondary hyperparathyroidism of renal origin: Secondary | ICD-10-CM | POA: Diagnosis not present

## 2017-03-19 DIAGNOSIS — E118 Type 2 diabetes mellitus with unspecified complications: Secondary | ICD-10-CM | POA: Diagnosis not present

## 2017-03-19 DIAGNOSIS — N186 End stage renal disease: Secondary | ICD-10-CM | POA: Diagnosis not present

## 2017-03-19 DIAGNOSIS — D631 Anemia in chronic kidney disease: Secondary | ICD-10-CM | POA: Diagnosis not present

## 2017-03-22 DIAGNOSIS — N186 End stage renal disease: Secondary | ICD-10-CM | POA: Diagnosis not present

## 2017-03-22 DIAGNOSIS — N2581 Secondary hyperparathyroidism of renal origin: Secondary | ICD-10-CM | POA: Diagnosis not present

## 2017-03-22 DIAGNOSIS — E118 Type 2 diabetes mellitus with unspecified complications: Secondary | ICD-10-CM | POA: Diagnosis not present

## 2017-03-22 DIAGNOSIS — D631 Anemia in chronic kidney disease: Secondary | ICD-10-CM | POA: Diagnosis not present

## 2017-03-24 DIAGNOSIS — E118 Type 2 diabetes mellitus with unspecified complications: Secondary | ICD-10-CM | POA: Diagnosis not present

## 2017-03-24 DIAGNOSIS — N2581 Secondary hyperparathyroidism of renal origin: Secondary | ICD-10-CM | POA: Diagnosis not present

## 2017-03-24 DIAGNOSIS — N186 End stage renal disease: Secondary | ICD-10-CM | POA: Diagnosis not present

## 2017-03-24 DIAGNOSIS — D631 Anemia in chronic kidney disease: Secondary | ICD-10-CM | POA: Diagnosis not present

## 2017-03-26 DIAGNOSIS — N2581 Secondary hyperparathyroidism of renal origin: Secondary | ICD-10-CM | POA: Diagnosis not present

## 2017-03-26 DIAGNOSIS — N186 End stage renal disease: Secondary | ICD-10-CM | POA: Diagnosis not present

## 2017-03-26 DIAGNOSIS — D631 Anemia in chronic kidney disease: Secondary | ICD-10-CM | POA: Diagnosis not present

## 2017-03-26 DIAGNOSIS — E118 Type 2 diabetes mellitus with unspecified complications: Secondary | ICD-10-CM | POA: Diagnosis not present

## 2017-03-29 DIAGNOSIS — E118 Type 2 diabetes mellitus with unspecified complications: Secondary | ICD-10-CM | POA: Diagnosis not present

## 2017-03-29 DIAGNOSIS — N186 End stage renal disease: Secondary | ICD-10-CM | POA: Diagnosis not present

## 2017-03-29 DIAGNOSIS — N2581 Secondary hyperparathyroidism of renal origin: Secondary | ICD-10-CM | POA: Diagnosis not present

## 2017-03-29 DIAGNOSIS — D631 Anemia in chronic kidney disease: Secondary | ICD-10-CM | POA: Diagnosis not present

## 2017-03-31 DIAGNOSIS — E118 Type 2 diabetes mellitus with unspecified complications: Secondary | ICD-10-CM | POA: Diagnosis not present

## 2017-03-31 DIAGNOSIS — N2581 Secondary hyperparathyroidism of renal origin: Secondary | ICD-10-CM | POA: Diagnosis not present

## 2017-03-31 DIAGNOSIS — D631 Anemia in chronic kidney disease: Secondary | ICD-10-CM | POA: Diagnosis not present

## 2017-03-31 DIAGNOSIS — N186 End stage renal disease: Secondary | ICD-10-CM | POA: Diagnosis not present

## 2017-04-02 DIAGNOSIS — E118 Type 2 diabetes mellitus with unspecified complications: Secondary | ICD-10-CM | POA: Diagnosis not present

## 2017-04-02 DIAGNOSIS — N186 End stage renal disease: Secondary | ICD-10-CM | POA: Diagnosis not present

## 2017-04-02 DIAGNOSIS — N2581 Secondary hyperparathyroidism of renal origin: Secondary | ICD-10-CM | POA: Diagnosis not present

## 2017-04-02 DIAGNOSIS — D631 Anemia in chronic kidney disease: Secondary | ICD-10-CM | POA: Diagnosis not present

## 2017-04-04 DIAGNOSIS — N186 End stage renal disease: Secondary | ICD-10-CM | POA: Diagnosis not present

## 2017-04-04 DIAGNOSIS — N041 Nephrotic syndrome with focal and segmental glomerular lesions: Secondary | ICD-10-CM | POA: Diagnosis not present

## 2017-04-04 DIAGNOSIS — Z992 Dependence on renal dialysis: Secondary | ICD-10-CM | POA: Diagnosis not present

## 2017-04-05 DIAGNOSIS — E118 Type 2 diabetes mellitus with unspecified complications: Secondary | ICD-10-CM | POA: Diagnosis not present

## 2017-04-05 DIAGNOSIS — D631 Anemia in chronic kidney disease: Secondary | ICD-10-CM | POA: Diagnosis not present

## 2017-04-05 DIAGNOSIS — N2581 Secondary hyperparathyroidism of renal origin: Secondary | ICD-10-CM | POA: Diagnosis not present

## 2017-04-05 DIAGNOSIS — D509 Iron deficiency anemia, unspecified: Secondary | ICD-10-CM | POA: Diagnosis not present

## 2017-04-05 DIAGNOSIS — N186 End stage renal disease: Secondary | ICD-10-CM | POA: Diagnosis not present

## 2017-04-07 DIAGNOSIS — D631 Anemia in chronic kidney disease: Secondary | ICD-10-CM | POA: Diagnosis not present

## 2017-04-07 DIAGNOSIS — N186 End stage renal disease: Secondary | ICD-10-CM | POA: Diagnosis not present

## 2017-04-07 DIAGNOSIS — E118 Type 2 diabetes mellitus with unspecified complications: Secondary | ICD-10-CM | POA: Diagnosis not present

## 2017-04-07 DIAGNOSIS — D509 Iron deficiency anemia, unspecified: Secondary | ICD-10-CM | POA: Diagnosis not present

## 2017-04-07 DIAGNOSIS — N2581 Secondary hyperparathyroidism of renal origin: Secondary | ICD-10-CM | POA: Diagnosis not present

## 2017-04-09 DIAGNOSIS — D631 Anemia in chronic kidney disease: Secondary | ICD-10-CM | POA: Diagnosis not present

## 2017-04-09 DIAGNOSIS — N2581 Secondary hyperparathyroidism of renal origin: Secondary | ICD-10-CM | POA: Diagnosis not present

## 2017-04-09 DIAGNOSIS — E118 Type 2 diabetes mellitus with unspecified complications: Secondary | ICD-10-CM | POA: Diagnosis not present

## 2017-04-09 DIAGNOSIS — N186 End stage renal disease: Secondary | ICD-10-CM | POA: Diagnosis not present

## 2017-04-09 DIAGNOSIS — D509 Iron deficiency anemia, unspecified: Secondary | ICD-10-CM | POA: Diagnosis not present

## 2017-04-12 DIAGNOSIS — N2581 Secondary hyperparathyroidism of renal origin: Secondary | ICD-10-CM | POA: Diagnosis not present

## 2017-04-12 DIAGNOSIS — E118 Type 2 diabetes mellitus with unspecified complications: Secondary | ICD-10-CM | POA: Diagnosis not present

## 2017-04-12 DIAGNOSIS — D631 Anemia in chronic kidney disease: Secondary | ICD-10-CM | POA: Diagnosis not present

## 2017-04-12 DIAGNOSIS — D509 Iron deficiency anemia, unspecified: Secondary | ICD-10-CM | POA: Diagnosis not present

## 2017-04-12 DIAGNOSIS — N186 End stage renal disease: Secondary | ICD-10-CM | POA: Diagnosis not present

## 2017-04-14 DIAGNOSIS — E118 Type 2 diabetes mellitus with unspecified complications: Secondary | ICD-10-CM | POA: Diagnosis not present

## 2017-04-14 DIAGNOSIS — D631 Anemia in chronic kidney disease: Secondary | ICD-10-CM | POA: Diagnosis not present

## 2017-04-14 DIAGNOSIS — N2581 Secondary hyperparathyroidism of renal origin: Secondary | ICD-10-CM | POA: Diagnosis not present

## 2017-04-14 DIAGNOSIS — D509 Iron deficiency anemia, unspecified: Secondary | ICD-10-CM | POA: Diagnosis not present

## 2017-04-14 DIAGNOSIS — N186 End stage renal disease: Secondary | ICD-10-CM | POA: Diagnosis not present

## 2017-04-16 DIAGNOSIS — N186 End stage renal disease: Secondary | ICD-10-CM | POA: Diagnosis not present

## 2017-04-16 DIAGNOSIS — D509 Iron deficiency anemia, unspecified: Secondary | ICD-10-CM | POA: Diagnosis not present

## 2017-04-16 DIAGNOSIS — D631 Anemia in chronic kidney disease: Secondary | ICD-10-CM | POA: Diagnosis not present

## 2017-04-16 DIAGNOSIS — N2581 Secondary hyperparathyroidism of renal origin: Secondary | ICD-10-CM | POA: Diagnosis not present

## 2017-04-16 DIAGNOSIS — E118 Type 2 diabetes mellitus with unspecified complications: Secondary | ICD-10-CM | POA: Diagnosis not present

## 2017-04-19 DIAGNOSIS — D631 Anemia in chronic kidney disease: Secondary | ICD-10-CM | POA: Diagnosis not present

## 2017-04-19 DIAGNOSIS — N2581 Secondary hyperparathyroidism of renal origin: Secondary | ICD-10-CM | POA: Diagnosis not present

## 2017-04-19 DIAGNOSIS — D509 Iron deficiency anemia, unspecified: Secondary | ICD-10-CM | POA: Diagnosis not present

## 2017-04-19 DIAGNOSIS — N186 End stage renal disease: Secondary | ICD-10-CM | POA: Diagnosis not present

## 2017-04-19 DIAGNOSIS — E118 Type 2 diabetes mellitus with unspecified complications: Secondary | ICD-10-CM | POA: Diagnosis not present

## 2017-04-21 DIAGNOSIS — N186 End stage renal disease: Secondary | ICD-10-CM | POA: Diagnosis not present

## 2017-04-21 DIAGNOSIS — N2581 Secondary hyperparathyroidism of renal origin: Secondary | ICD-10-CM | POA: Diagnosis not present

## 2017-04-21 DIAGNOSIS — D509 Iron deficiency anemia, unspecified: Secondary | ICD-10-CM | POA: Diagnosis not present

## 2017-04-21 DIAGNOSIS — D631 Anemia in chronic kidney disease: Secondary | ICD-10-CM | POA: Diagnosis not present

## 2017-04-21 DIAGNOSIS — E118 Type 2 diabetes mellitus with unspecified complications: Secondary | ICD-10-CM | POA: Diagnosis not present

## 2017-04-23 DIAGNOSIS — N186 End stage renal disease: Secondary | ICD-10-CM | POA: Diagnosis not present

## 2017-04-23 DIAGNOSIS — N2581 Secondary hyperparathyroidism of renal origin: Secondary | ICD-10-CM | POA: Diagnosis not present

## 2017-04-23 DIAGNOSIS — E118 Type 2 diabetes mellitus with unspecified complications: Secondary | ICD-10-CM | POA: Diagnosis not present

## 2017-04-23 DIAGNOSIS — D509 Iron deficiency anemia, unspecified: Secondary | ICD-10-CM | POA: Diagnosis not present

## 2017-04-23 DIAGNOSIS — D631 Anemia in chronic kidney disease: Secondary | ICD-10-CM | POA: Diagnosis not present

## 2017-04-26 DIAGNOSIS — N2581 Secondary hyperparathyroidism of renal origin: Secondary | ICD-10-CM | POA: Diagnosis not present

## 2017-04-26 DIAGNOSIS — D631 Anemia in chronic kidney disease: Secondary | ICD-10-CM | POA: Diagnosis not present

## 2017-04-26 DIAGNOSIS — N186 End stage renal disease: Secondary | ICD-10-CM | POA: Diagnosis not present

## 2017-04-26 DIAGNOSIS — D509 Iron deficiency anemia, unspecified: Secondary | ICD-10-CM | POA: Diagnosis not present

## 2017-04-26 DIAGNOSIS — E118 Type 2 diabetes mellitus with unspecified complications: Secondary | ICD-10-CM | POA: Diagnosis not present

## 2017-04-28 DIAGNOSIS — N2581 Secondary hyperparathyroidism of renal origin: Secondary | ICD-10-CM | POA: Diagnosis not present

## 2017-04-28 DIAGNOSIS — D631 Anemia in chronic kidney disease: Secondary | ICD-10-CM | POA: Diagnosis not present

## 2017-04-28 DIAGNOSIS — E118 Type 2 diabetes mellitus with unspecified complications: Secondary | ICD-10-CM | POA: Diagnosis not present

## 2017-04-28 DIAGNOSIS — N186 End stage renal disease: Secondary | ICD-10-CM | POA: Diagnosis not present

## 2017-04-28 DIAGNOSIS — D509 Iron deficiency anemia, unspecified: Secondary | ICD-10-CM | POA: Diagnosis not present

## 2017-04-29 ENCOUNTER — Encounter (HOSPITAL_COMMUNITY): Payer: Medicare Other

## 2017-04-29 ENCOUNTER — Encounter (HOSPITAL_COMMUNITY): Payer: Self-pay

## 2017-04-29 ENCOUNTER — Encounter (HOSPITAL_COMMUNITY): Payer: Medicare Other | Attending: Oncology | Admitting: Oncology

## 2017-04-29 VITALS — BP 159/79 | HR 86 | Temp 98.4°F | Resp 18 | Ht 65.0 in | Wt 192.5 lb

## 2017-04-29 DIAGNOSIS — Z79899 Other long term (current) drug therapy: Secondary | ICD-10-CM | POA: Diagnosis not present

## 2017-04-29 DIAGNOSIS — D638 Anemia in other chronic diseases classified elsewhere: Secondary | ICD-10-CM | POA: Insufficient documentation

## 2017-04-29 DIAGNOSIS — Z87891 Personal history of nicotine dependence: Secondary | ICD-10-CM | POA: Insufficient documentation

## 2017-04-29 DIAGNOSIS — C9000 Multiple myeloma not having achieved remission: Secondary | ICD-10-CM | POA: Diagnosis not present

## 2017-04-29 DIAGNOSIS — Z9889 Other specified postprocedural states: Secondary | ICD-10-CM | POA: Diagnosis not present

## 2017-04-29 DIAGNOSIS — I132 Hypertensive heart and chronic kidney disease with heart failure and with stage 5 chronic kidney disease, or end stage renal disease: Secondary | ICD-10-CM | POA: Insufficient documentation

## 2017-04-29 DIAGNOSIS — Z8249 Family history of ischemic heart disease and other diseases of the circulatory system: Secondary | ICD-10-CM | POA: Diagnosis not present

## 2017-04-29 DIAGNOSIS — N186 End stage renal disease: Secondary | ICD-10-CM | POA: Diagnosis not present

## 2017-04-29 DIAGNOSIS — J45909 Unspecified asthma, uncomplicated: Secondary | ICD-10-CM | POA: Insufficient documentation

## 2017-04-29 DIAGNOSIS — Z992 Dependence on renal dialysis: Secondary | ICD-10-CM

## 2017-04-29 DIAGNOSIS — I509 Heart failure, unspecified: Secondary | ICD-10-CM | POA: Diagnosis not present

## 2017-04-29 DIAGNOSIS — Z794 Long term (current) use of insulin: Secondary | ICD-10-CM | POA: Diagnosis not present

## 2017-04-29 DIAGNOSIS — N269 Renal sclerosis, unspecified: Secondary | ICD-10-CM | POA: Insufficient documentation

## 2017-04-29 LAB — COMPREHENSIVE METABOLIC PANEL
ALK PHOS: 56 U/L (ref 38–126)
ALT: 8 U/L — AB (ref 17–63)
AST: 14 U/L — ABNORMAL LOW (ref 15–41)
Albumin: 3.5 g/dL (ref 3.5–5.0)
Anion gap: 4 — ABNORMAL LOW (ref 5–15)
BILIRUBIN TOTAL: 0.9 mg/dL (ref 0.3–1.2)
BUN: 23 mg/dL — ABNORMAL HIGH (ref 6–20)
CALCIUM: 9.1 mg/dL (ref 8.9–10.3)
CO2: 36 mmol/L — AB (ref 22–32)
CREATININE: 6.63 mg/dL — AB (ref 0.61–1.24)
Chloride: 91 mmol/L — ABNORMAL LOW (ref 101–111)
GFR, EST AFRICAN AMERICAN: 9 mL/min — AB (ref >60)
GFR, EST NON AFRICAN AMERICAN: 8 mL/min — AB (ref >60)
Glucose, Bld: 123 mg/dL — ABNORMAL HIGH (ref 65–99)
Potassium: 4.2 mmol/L (ref 3.5–5.1)
Sodium: 131 mmol/L — ABNORMAL LOW (ref 135–145)
Total Protein: 8.7 g/dL — ABNORMAL HIGH (ref 6.5–8.1)

## 2017-04-29 LAB — CBC WITH DIFFERENTIAL/PLATELET
BASOS PCT: 1 %
Basophils Absolute: 0.1 10*3/uL (ref 0.0–0.1)
EOS ABS: 0.3 10*3/uL (ref 0.0–0.7)
Eosinophils Relative: 5 %
HCT: 37.7 % — ABNORMAL LOW (ref 39.0–52.0)
HEMOGLOBIN: 12.8 g/dL — AB (ref 13.0–17.0)
Lymphocytes Relative: 30 %
Lymphs Abs: 2 10*3/uL (ref 0.7–4.0)
MCH: 32.4 pg (ref 26.0–34.0)
MCHC: 34 g/dL (ref 30.0–36.0)
MCV: 95.4 fL (ref 78.0–100.0)
MONOS PCT: 11 %
Monocytes Absolute: 0.7 10*3/uL (ref 0.1–1.0)
NEUTROS PCT: 53 %
Neutro Abs: 3.5 10*3/uL (ref 1.7–7.7)
Platelets: 208 10*3/uL (ref 150–400)
RBC: 3.95 MIL/uL — ABNORMAL LOW (ref 4.22–5.81)
RDW: 15.1 % (ref 11.5–15.5)
WBC: 6.6 10*3/uL (ref 4.0–10.5)

## 2017-04-29 LAB — VITAMIN B12: Vitamin B-12: 200 pg/mL (ref 180–914)

## 2017-04-29 LAB — IRON AND TIBC
IRON: 59 ug/dL (ref 45–182)
Saturation Ratios: 33 % (ref 17.9–39.5)
TIBC: 176 ug/dL — AB (ref 250–450)
UIBC: 117 ug/dL

## 2017-04-29 LAB — FOLATE: Folate: 5.1 ng/mL — ABNORMAL LOW (ref >5.9)

## 2017-04-29 LAB — LACTATE DEHYDROGENASE: LDH: 106 U/L (ref 98–192)

## 2017-04-29 LAB — FERRITIN: FERRITIN: 734 ng/mL — AB (ref 24–336)

## 2017-04-29 NOTE — Progress Notes (Signed)
[facility] Initial Visit:  Patient Care Team: Wardell Honour, MD as PCP - General (Family Medicine) Jamal Maes, MD as Consulting Physician (Nephrology) Center, [facility]  CHIEF COMPLAINTS/PURPOSE OF CONSULTATION:  Smoldering myeloma  HISTORY OF PRESENTING ILLNESS: James [name] 61 y.o. male is here because of history of smoldering myeloma. He was last seen in our cancer center back in 2016. SPEP in 2016 demonstrated a single peak (M-spike) in the gamma region which may represent monoclonal protein. At that time he had a bone marrow biopsy that demonstrated 11% plasma cells. He has ESRD on HD T/Th/Sat and had a renal biopsy back in 2016 as well which demonstrated FSGS and diabetic glomerulosclerosis, no evidence of monoclonal protein deposition in the kidneys. It was suspected that his anemia was related to his ESRD. Patient denies any new bone pains. He denies any chest pain, shortness breath, abdominal pain, focal weakness, fatigue, decreased appetite.  Review of Systems  Constitutional: Negative for appetite change, chills, fatigue and fever.  HENT:   Negative for hearing loss, lump/mass, mouth sores, sore throat and tinnitus.   Eyes: Negative for eye problems and icterus.  Respiratory: Negative for chest tightness, cough, hemoptysis, shortness of breath and wheezing.   Cardiovascular: Negative for chest pain, leg swelling and palpitations.  Gastrointestinal: Positive for constipation. Negative for abdominal distention, abdominal pain, blood in stool, diarrhea, nausea and vomiting.  Endocrine: Negative.  Negative for hot flashes.  Genitourinary: Negative for difficulty urinating, frequency and hematuria.   Musculoskeletal: Negative for arthralgias and neck pain.  Skin: Negative for itching and rash.  Neurological: Negative for dizziness, headaches and speech difficulty.  Hematological: Negative for adenopathy. Does not bruise/bleed easily.   Psychiatric/Behavioral: Negative for confusion. The patient is not nervous/anxious.     MEDICAL HISTORY: Past Medical History:  Diagnosis Date  . Asthma    as a child  . CHF (congestive heart failure) ([place])   . Chronic kidney disease    TTHS  . Diabetes mellitus without complication (HCC)    Type II  . ESRD (end stage renal disease) ([place])   . Hypertension     SURGICAL HISTORY: Past Surgical History:  Procedure Laterality Date  . AV FISTULA PLACEMENT Left 04/08/2015   Procedure: LEFT RADIOCEPHALIC ARTERIOVENOUS (AV) FISTULA CREATION;  Surgeon: Elam Dutch, MD;  Location: [place];  Service: Vascular;  Laterality: Left;  . AV FISTULA PLACEMENT Right 09/15/2015   Procedure: RADIOCEPHALIC VERSUS BRACHIOCEPHALIC ARTERIOVENOUS (AV) FISTULA CREATION - RIGHT ARM;  Surgeon: Conrad [place], MD;  Location: [facility];  Service: Vascular;  Laterality: Right;  . AV FISTULA PLACEMENT Left 06/07/2016   Procedure: LEFT BRACHIOCEPHALIC ARTERIOVENOUS (AV) FISTULA CREATION;  Surgeon: Elam Dutch, MD;  Location: [place];  Service: Vascular;  Laterality: Left;  . COLONOSCOPY    . IR GENERIC HISTORICAL  08/04/2016   IR REMOVAL TUN CV CATH W/O FL 08/04/2016 Ascencion Dike, PA-C [facility]  . IR GENERIC HISTORICAL  08/06/2016   IR FLUORO GUIDE CV LINE LEFT 08/06/2016 Arne Cleveland, MD [facility]  . IR GENERIC HISTORICAL  08/06/2016   IR US GUIDE VASC ACCESS LEFT 08/06/2016 Arne Cleveland, MD [facility]  . LIGATION OF ARTERIOVENOUS  FISTULA Left 09/15/2015   Procedure: LIGATION OF RADIOCEPHALIC ARTERIOVENOUS FISTULA - LEFT ARM;  Surgeon: Conrad Bartlett, MD;  Location: [place];  Service: Vascular;  Laterality: Left;  . LIGATION OF ARTERIOVENOUS  FISTULA Right 04/16/2016   Procedure: LIGATION OF ARTERIOVENOUS  FISTULA;  Surgeon: Elam Dutch, MD;  Location: [place];  Service: Vascular;  Laterality: Right;  . LIGATION OF COMPETING BRANCHES OF ARTERIOVENOUS FISTULA Left 07/16/2015   Procedure: LIGATION OF SIDE  BRANCHES OF ARTERIOVENOUS FISTULA LEFT ARM;  Surgeon: Rosetta Posner, MD;  Location: [facility];  Service: Vascular;  Laterality: Left;  . PERIPHERAL VASCULAR CATHETERIZATION N/A 07/04/2015   Procedure: Fistulagram;  Surgeon: Elam Dutch, MD;  Location: [place] [facility];  Service: Cardiovascular;  Laterality: N/A;  . PERIPHERAL VASCULAR CATHETERIZATION N/A 09/08/2015   Procedure: Fistulagram;  Surgeon: Conrad [place], MD;  Location: [place] [facility];  Service: Cardiovascular;  Laterality: N/A;  . PERIPHERAL VASCULAR CATHETERIZATION Right 02/23/2016   Procedure: Fistulagram;  Surgeon: Conrad [place], MD;  Location: [place] [facility];  Service: Cardiovascular;  Laterality: Right;  . PERIPHERAL VASCULAR CATHETERIZATION N/A 04/12/2016   Procedure: Fistulagram;  Surgeon: Conrad [place], MD;  Location: [place] [facility];  Service: Cardiovascular;  Laterality: N/A;  . REVISON OF ARTERIOVENOUS FISTULA Right 03/10/2016   Procedure: RIGHT CEPHALIC VEIN TURNDOWN;  Surgeon: Conrad Waverly, MD;  Location: [place];  Service: Vascular;  Laterality: Right;  . TEE WITHOUT CARDIOVERSION N/A 08/06/2016   Procedure: TRANSESOPHAGEAL ECHOCARDIOGRAM (TEE);  Surgeon: Dixie Dials, MD;  Location: [facility];  Service: Cardiovascular;  Laterality: N/A;    SOCIAL HISTORY: Social History   Social History  . Marital status: Single    Spouse name: N/A  . Number of children: N/A  . Years of education: N/A   Occupational History  . Not on file.   Social History Main Topics  . Smoking status: Former Smoker    Years: 1.00    Types: Cigarettes    Quit date: 07/08/2005  . Smokeless tobacco: Never Used  . Alcohol use No  . Drug use: No  . Sexual activity: Not on file   Other Topics Concern  . Not on file   Social History Narrative  . No narrative on file    FAMILY HISTORY Family History  Problem Relation Age of Onset  . Hypertension Other     ALLERGIES:  has No Known Allergies.  MEDICATIONS:  Current Outpatient  Prescriptions  Medication Sig Dispense Refill  . calcium acetate (PHOSLO) 667 MG capsule Take 2 capsules (1,334 mg total) by mouth 3 (three) times daily with meals. 90 capsule 0  . ceFAZolin (ANCEF) 2-4 GM/100ML-% IVPB Inject 100 mLs (2 g total) into the vein Every Tuesday,Thursday,and Saturday with dialysis. 1 each 7  . Darbepoetin Alfa (ARANESP) 100 MCG/0.5ML SOSY injection Inject 0.5 mLs (100 mcg total) into the vein every Thursday with hemodialysis. Will be given at dialysis.    Marland Kitchen gabapentin (NEURONTIN) 300 MG capsule Take 1 capsule (300 mg total) by mouth at bedtime. 30 capsule 0  . glucose blood (ONETOUCH VERIO) test strip Test three times daily 100 each 2  . insulin aspart (NOVOLOG) 100 UNIT/ML injection Inject 6 Units into the skin 3 (three) times daily before meals. 3 vial PRN  . insulin detemir (LEVEMIR) 100 UNIT/ML injection Inject 0.4 mLs (40 Units total) into the skin at bedtime. 10 mL 0  . isosorbide mononitrate (IMDUR) 60 MG 24 hr tablet Take 1 tablet (60 mg total) by mouth daily. 30 tablet 0  . Vitamin D, Ergocalciferol, (DRISDOL) 50000 units CAPS capsule Take 50,000 Units by mouth every 7 (seven) days. Monday     No current facility-administered medications for this visit.     PHYSICAL  EXAMINATION:  ECOG PERFORMANCE STATUS: 0 - Asymptomatic   Vitals:   04/29/17 0902  BP: (!) 159/79  Pulse: 86  Resp: 18  Temp: 98.4 F (36.9 C)    Filed Weights   04/29/17 0902  Weight: 192 lb 8 oz (87.3 kg)     Physical Exam  Constitutional: He is oriented to person, place, and time and well-developed, well-nourished, and in no distress. No distress.  HENT:  Head: Normocephalic and atraumatic.  Mouth/Throat: No oropharyngeal exudate.  Eyes: Conjunctivae are normal. Pupils are equal, round, and reactive to light. No scleral icterus.  Neck: Normal range of motion. Neck supple. No JVD present.  Cardiovascular: Normal rate, regular rhythm and normal heart sounds.  Exam reveals no  gallop and no friction rub.   No murmur heard. Pulmonary/Chest: Breath sounds normal. No respiratory distress. He has no wheezes. He has no rales.  Abdominal: Soft. Bowel sounds are normal. He exhibits no distension. There is no tenderness. There is no guarding.  Musculoskeletal: He exhibits no edema or tenderness.  Left arm AV fistula  Lymphadenopathy:    He has no cervical adenopathy.  Neurological: He is alert and oriented to person, place, and time. No cranial nerve deficit.  Skin: Skin is warm and dry. No rash noted. No erythema. No pallor.  Psychiatric: Affect and judgment normal.     LABORATORY DATA: I have personally reviewed the data as listed:  No visits with results within 1 Month(s) from this visit.  Latest known visit with results is:  Office Visit on 01/26/2017  Component Date Value Ref Range Status  . Bayer DCA Hb A1c Waived 01/26/2017 6.2  <7.0 % Final   Comment:                                       Diabetic Adult            <7.0                                       Healthy Adult        4.3 - 5.7                                                           (DCCT/NGSP) American Diabetes Association's Summary of Glycemic Recommendations for Adults with Diabetes: Hemoglobin A1c <7.0%. More stringent glycemic goals (A1c <6.0%) may further reduce complications at the cost of increased risk of hypoglycemia.     RADIOGRAPHIC STUDIES: I have personally reviewed the radiological images as listed and agree with the findings in the report  PATHOLOGY: Diagnosis Bone Marrow, Aspirate,Biopsy, and Clot, right iliac - NORMOCELLULAR BONE MARROW FOR AGE WITH PLASMA CELL NEOPLASM. - TRILINEAGE HEMATOPOIESIS. - SEE COMMENT. PERIPHERAL BLOOD: - NORMOCYTIC-NORMOCHROMIC ANEMIA Diagnosis Note The bone marrow is generally normocellular for age with trilineage hematopoiesis and nonspecific myeloid changes. However, the plasma cells are increased in number representing 11% of all  cells associated with atypical cytomorphologic features. Immunohistochemical stains show that the plasma cells are kappa light chain restricted consistent with plasma cell neoplasm. Correlation with cytogenetic studies is recommended. (BNS:kh/ds 04-03-15) BASSAM SMIR  MD Pathologist, Electronic Signature (Case signed 04/04/2015)   Diagnosis Kidney, biopsy w/ EM l, left cortex COLLAPSING VARIANT OF FOCAL AND SEGMENTAL GLOMERULOSCLEROSIS. DIABETIC GLOMERULOSCLEROSIS. MODERATE ARTERIONEPHROSLCEROSIS. NO EVIDENCE OF A MONOCLONAL IMMUNOGLOBULIN DEPOSITION DISEASE Diagnosis Note Diagnostic evaluation conducted by [facility]. Candiss Norse, MD @ [facility] Nephropathology; [id number] Claudette Laws MD Pathologist, Electronic Signature (Case signed 04/10/2015)  ASSESSMENT/PLAN 61 y/o with Anemia of chronic disease and ESRD on HD presents for evaluation of smoldering myeloma.  It has been 2 years since patient has follow up. I will need to repeat all of his myeloma labs, as stated below. Depending on those results I may also need to repeat a bone marrow biopsy to assess the amount of plasma cells now in his bone marrow. Ordered a skeletal survey to assess for lytic bone lesions. ESRD proven to be not related by multiple myeloma on previous kidney biopsy. Anemia likely due to ESRD. No evidence of hypercalcemia.  RTC in 10 days to review labs and to discuss the next plan of care.  Orders Placed This Encounter  Procedures  . Comprehensive metabolic panel    Standing Status:   Future    Number of Occurrences:   1    Standing Expiration Date:   04/29/2018  . CBC with Differential    Standing Status:   Future    Number of Occurrences:   1    Standing Expiration Date:   04/29/2018  . Multiple Myeloma Panel (SPEP&IFE w/QIG)    Standing Status:   Future    Number of Occurrences:   1    Standing Expiration Date:   04/29/2018  . Kappa/lambda light chains    Standing Status:   Future    Number of Occurrences:   1     Standing Expiration Date:   04/29/2018  . Beta 2 microglobuline, serum    Standing Status:   Future    Number of Occurrences:   1    Standing Expiration Date:   04/29/2018  . Erythropoietin    Standing Status:   Future    Number of Occurrences:   1    Standing Expiration Date:   04/29/2018  . Iron and TIBC    Standing Status:   Future    Number of Occurrences:   1    Standing Expiration Date:   04/29/2018  . Ferritin    Standing Status:   Future    Number of Occurrences:   1    Standing Expiration Date:   04/29/2018  . Vitamin B12    Standing Status:   Future    Number of Occurrences:   1    Standing Expiration Date:   04/29/2018  . Folate    Standing Status:   Future    Number of Occurrences:   1    Standing Expiration Date:   04/29/2018  . Lactate dehydrogenase    Standing Status:   Future    Number of Occurrences:   1    Standing Expiration Date:   04/29/2018    All questions were answered. The patient knows to call the clinic with any problems, questions or concerns.  This note was electronically signed.    Twana First, MD  04/29/2017 9:21 AM

## 2017-04-29 NOTE — Patient Instructions (Signed)
[facility] Discharge Instructions  RECOMMENDATIONS MADE BY THE CONSULTANT AND ANY TEST RESULTS WILL BE SENT TO YOUR REFERRING PHYSICIAN. You were seen today by Dr. Talbert Cage. Labs today. Return in 10-14 days to review labs and follow up.   Thank you for choosing [place] at [facility] to provide your oncology and hematology care.  To afford each patient quality time with our provider, please arrive at least 15 minutes before your scheduled appointment time.    If you have a lab appointment with the [facility] please come in thru the  Main Entrance and check in at the main information desk  You need to re-schedule your appointment should you arrive 10 or more minutes late.  We strive to give you quality time with our providers, and arriving late affects you and other patients whose appointments are after yours.  Also, if you no show three or more times for appointments you may be dismissed from the clinic at the providers discretion.     Again, thank you for choosing [facility].  Our hope is that these requests will decrease the amount of time that you wait before being seen by our physicians.       _____________________________________________________________  Should you have questions after your visit to [facility], please contact our office at [phone] between the hours of 8:30 a.m. and 4:30 p.m.  Voicemails left after 4:30 p.m. will not be returned until the following business day.  For prescription refill requests, have your pharmacy contact our office.       Resources For Cancer Patients and their Caregivers ? American Cancer Society: Can assist with transportation, wigs, general needs, runs Look Good Feel Better.        202-675-3951 ? Cancer Care: Provides financial assistance, online support groups, medication/co-pay assistance.  1-800-813-HOPE 905-789-8610) ? [facility] Assists [place] cancer patients and their families through emotional , educational and financial support.  951 733 5233 ? [place] DSS Where to apply for food stamps, Medicaid and utility assistance. 2157426965 ? [facility]: Transportation to medical appointments. 249-709-0220 ? Social Security Administration: May apply for disability if have a Stage IV cancer. (878) 772-1082 (249)631-4221 ? [employer], Disability and Transit Services: Assists with nutrition, care and transit needs. [facility] Support Programs: @10RELATIVEDAYS @ > Cancer Support Group  2nd Tuesday of the month 1pm-2pm, Journey Room  > Creative Journey  3rd Tuesday of the month 1130am-1pm, Journey Room  > Look Good Feel Better  1st Wednesday of the month 10am-12 noon, Journey Room (Call [facility] to register [phone])

## 2017-04-30 DIAGNOSIS — N186 End stage renal disease: Secondary | ICD-10-CM | POA: Diagnosis not present

## 2017-04-30 DIAGNOSIS — D631 Anemia in chronic kidney disease: Secondary | ICD-10-CM | POA: Diagnosis not present

## 2017-04-30 DIAGNOSIS — D509 Iron deficiency anemia, unspecified: Secondary | ICD-10-CM | POA: Diagnosis not present

## 2017-04-30 DIAGNOSIS — E118 Type 2 diabetes mellitus with unspecified complications: Secondary | ICD-10-CM | POA: Diagnosis not present

## 2017-04-30 DIAGNOSIS — N2581 Secondary hyperparathyroidism of renal origin: Secondary | ICD-10-CM | POA: Diagnosis not present

## 2017-04-30 LAB — BETA 2 MICROGLOBULIN, SERUM: BETA 2 MICROGLOBULIN: 15.7 mg/L — AB (ref 0.6–2.4)

## 2017-04-30 LAB — ERYTHROPOIETIN: ERYTHROPOIETIN: 20.4 m[IU]/mL — AB (ref 2.6–18.5)

## 2017-05-03 DIAGNOSIS — D631 Anemia in chronic kidney disease: Secondary | ICD-10-CM | POA: Diagnosis not present

## 2017-05-03 DIAGNOSIS — N2581 Secondary hyperparathyroidism of renal origin: Secondary | ICD-10-CM | POA: Diagnosis not present

## 2017-05-03 DIAGNOSIS — N186 End stage renal disease: Secondary | ICD-10-CM | POA: Diagnosis not present

## 2017-05-03 DIAGNOSIS — E118 Type 2 diabetes mellitus with unspecified complications: Secondary | ICD-10-CM | POA: Diagnosis not present

## 2017-05-03 DIAGNOSIS — D509 Iron deficiency anemia, unspecified: Secondary | ICD-10-CM | POA: Diagnosis not present

## 2017-05-03 LAB — MULTIPLE MYELOMA PANEL, SERUM
ALBUMIN SERPL ELPH-MCNC: 3.7 g/dL (ref 2.9–4.4)
ALPHA2 GLOB SERPL ELPH-MCNC: 0.8 g/dL (ref 0.4–1.0)
Albumin/Glob SerPl: 0.8 (ref 0.7–1.7)
Alpha 1: 0.3 g/dL (ref 0.0–0.4)
B-GLOBULIN SERPL ELPH-MCNC: 0.8 g/dL (ref 0.7–1.3)
GAMMA GLOB SERPL ELPH-MCNC: 2.9 g/dL — AB (ref 0.4–1.8)
GLOBULIN, TOTAL: 4.7 g/dL — AB (ref 2.2–3.9)
IGG (IMMUNOGLOBIN G), SERUM: 3420 mg/dL — AB (ref 700–1600)
IgA: 72 mg/dL — ABNORMAL LOW (ref 90–386)
IgM, Serum: 12 mg/dL — ABNORMAL LOW (ref 20–172)
M PROTEIN SERPL ELPH-MCNC: 2.6 g/dL — AB
TOTAL PROTEIN ELP: 8.4 g/dL (ref 6.0–8.5)

## 2017-05-03 LAB — KAPPA/LAMBDA LIGHT CHAINS
Kappa free light chain: 109.1 mg/L — ABNORMAL HIGH (ref 3.3–19.4)
Kappa, lambda light chain ratio: 3.31 — ABNORMAL HIGH (ref 0.26–1.65)
Lambda free light chains: 33 mg/L — ABNORMAL HIGH (ref 5.7–26.3)

## 2017-05-05 DIAGNOSIS — D509 Iron deficiency anemia, unspecified: Secondary | ICD-10-CM | POA: Diagnosis not present

## 2017-05-05 DIAGNOSIS — Z992 Dependence on renal dialysis: Secondary | ICD-10-CM | POA: Diagnosis not present

## 2017-05-05 DIAGNOSIS — E118 Type 2 diabetes mellitus with unspecified complications: Secondary | ICD-10-CM | POA: Diagnosis not present

## 2017-05-05 DIAGNOSIS — D631 Anemia in chronic kidney disease: Secondary | ICD-10-CM | POA: Diagnosis not present

## 2017-05-05 DIAGNOSIS — N2581 Secondary hyperparathyroidism of renal origin: Secondary | ICD-10-CM | POA: Diagnosis not present

## 2017-05-05 DIAGNOSIS — N041 Nephrotic syndrome with focal and segmental glomerular lesions: Secondary | ICD-10-CM | POA: Diagnosis not present

## 2017-05-05 DIAGNOSIS — N186 End stage renal disease: Secondary | ICD-10-CM | POA: Diagnosis not present

## 2017-05-07 DIAGNOSIS — N2581 Secondary hyperparathyroidism of renal origin: Secondary | ICD-10-CM | POA: Diagnosis not present

## 2017-05-07 DIAGNOSIS — N186 End stage renal disease: Secondary | ICD-10-CM | POA: Diagnosis not present

## 2017-05-07 DIAGNOSIS — E118 Type 2 diabetes mellitus with unspecified complications: Secondary | ICD-10-CM | POA: Diagnosis not present

## 2017-05-07 DIAGNOSIS — D509 Iron deficiency anemia, unspecified: Secondary | ICD-10-CM | POA: Diagnosis not present

## 2017-05-10 DIAGNOSIS — N2581 Secondary hyperparathyroidism of renal origin: Secondary | ICD-10-CM | POA: Diagnosis not present

## 2017-05-10 DIAGNOSIS — E118 Type 2 diabetes mellitus with unspecified complications: Secondary | ICD-10-CM | POA: Diagnosis not present

## 2017-05-10 DIAGNOSIS — D509 Iron deficiency anemia, unspecified: Secondary | ICD-10-CM | POA: Diagnosis not present

## 2017-05-10 DIAGNOSIS — N186 End stage renal disease: Secondary | ICD-10-CM | POA: Diagnosis not present

## 2017-05-12 DIAGNOSIS — D509 Iron deficiency anemia, unspecified: Secondary | ICD-10-CM | POA: Diagnosis not present

## 2017-05-12 DIAGNOSIS — E118 Type 2 diabetes mellitus with unspecified complications: Secondary | ICD-10-CM | POA: Diagnosis not present

## 2017-05-12 DIAGNOSIS — N2581 Secondary hyperparathyroidism of renal origin: Secondary | ICD-10-CM | POA: Diagnosis not present

## 2017-05-12 DIAGNOSIS — N186 End stage renal disease: Secondary | ICD-10-CM | POA: Diagnosis not present

## 2017-05-13 ENCOUNTER — Other Ambulatory Visit (HOSPITAL_COMMUNITY): Payer: Medicare Other

## 2017-05-13 ENCOUNTER — Ambulatory Visit (HOSPITAL_COMMUNITY): Payer: Medicare Other | Admitting: Hematology

## 2017-05-14 DIAGNOSIS — D509 Iron deficiency anemia, unspecified: Secondary | ICD-10-CM | POA: Diagnosis not present

## 2017-05-14 DIAGNOSIS — E118 Type 2 diabetes mellitus with unspecified complications: Secondary | ICD-10-CM | POA: Diagnosis not present

## 2017-05-14 DIAGNOSIS — N2581 Secondary hyperparathyroidism of renal origin: Secondary | ICD-10-CM | POA: Diagnosis not present

## 2017-05-14 DIAGNOSIS — N186 End stage renal disease: Secondary | ICD-10-CM | POA: Diagnosis not present

## 2017-05-17 DIAGNOSIS — N2581 Secondary hyperparathyroidism of renal origin: Secondary | ICD-10-CM | POA: Diagnosis not present

## 2017-05-17 DIAGNOSIS — E118 Type 2 diabetes mellitus with unspecified complications: Secondary | ICD-10-CM | POA: Diagnosis not present

## 2017-05-17 DIAGNOSIS — D509 Iron deficiency anemia, unspecified: Secondary | ICD-10-CM | POA: Diagnosis not present

## 2017-05-17 DIAGNOSIS — N186 End stage renal disease: Secondary | ICD-10-CM | POA: Diagnosis not present

## 2017-05-19 DIAGNOSIS — N2581 Secondary hyperparathyroidism of renal origin: Secondary | ICD-10-CM | POA: Diagnosis not present

## 2017-05-19 DIAGNOSIS — N186 End stage renal disease: Secondary | ICD-10-CM | POA: Diagnosis not present

## 2017-05-19 DIAGNOSIS — D509 Iron deficiency anemia, unspecified: Secondary | ICD-10-CM | POA: Diagnosis not present

## 2017-05-19 DIAGNOSIS — E118 Type 2 diabetes mellitus with unspecified complications: Secondary | ICD-10-CM | POA: Diagnosis not present

## 2017-05-21 DIAGNOSIS — N2581 Secondary hyperparathyroidism of renal origin: Secondary | ICD-10-CM | POA: Diagnosis not present

## 2017-05-21 DIAGNOSIS — N186 End stage renal disease: Secondary | ICD-10-CM | POA: Diagnosis not present

## 2017-05-21 DIAGNOSIS — D509 Iron deficiency anemia, unspecified: Secondary | ICD-10-CM | POA: Diagnosis not present

## 2017-05-21 DIAGNOSIS — E118 Type 2 diabetes mellitus with unspecified complications: Secondary | ICD-10-CM | POA: Diagnosis not present

## 2017-05-24 DIAGNOSIS — N186 End stage renal disease: Secondary | ICD-10-CM | POA: Diagnosis not present

## 2017-05-24 DIAGNOSIS — D509 Iron deficiency anemia, unspecified: Secondary | ICD-10-CM | POA: Diagnosis not present

## 2017-05-24 DIAGNOSIS — N2581 Secondary hyperparathyroidism of renal origin: Secondary | ICD-10-CM | POA: Diagnosis not present

## 2017-05-24 DIAGNOSIS — E118 Type 2 diabetes mellitus with unspecified complications: Secondary | ICD-10-CM | POA: Diagnosis not present

## 2017-05-26 DIAGNOSIS — E118 Type 2 diabetes mellitus with unspecified complications: Secondary | ICD-10-CM | POA: Diagnosis not present

## 2017-05-26 DIAGNOSIS — N186 End stage renal disease: Secondary | ICD-10-CM | POA: Diagnosis not present

## 2017-05-26 DIAGNOSIS — N2581 Secondary hyperparathyroidism of renal origin: Secondary | ICD-10-CM | POA: Diagnosis not present

## 2017-05-26 DIAGNOSIS — D509 Iron deficiency anemia, unspecified: Secondary | ICD-10-CM | POA: Diagnosis not present

## 2017-05-28 DIAGNOSIS — N186 End stage renal disease: Secondary | ICD-10-CM | POA: Diagnosis not present

## 2017-05-28 DIAGNOSIS — D509 Iron deficiency anemia, unspecified: Secondary | ICD-10-CM | POA: Diagnosis not present

## 2017-05-28 DIAGNOSIS — N2581 Secondary hyperparathyroidism of renal origin: Secondary | ICD-10-CM | POA: Diagnosis not present

## 2017-05-28 DIAGNOSIS — E118 Type 2 diabetes mellitus with unspecified complications: Secondary | ICD-10-CM | POA: Diagnosis not present

## 2017-05-31 DIAGNOSIS — N186 End stage renal disease: Secondary | ICD-10-CM | POA: Diagnosis not present

## 2017-05-31 DIAGNOSIS — N2581 Secondary hyperparathyroidism of renal origin: Secondary | ICD-10-CM | POA: Diagnosis not present

## 2017-05-31 DIAGNOSIS — D509 Iron deficiency anemia, unspecified: Secondary | ICD-10-CM | POA: Diagnosis not present

## 2017-05-31 DIAGNOSIS — E118 Type 2 diabetes mellitus with unspecified complications: Secondary | ICD-10-CM | POA: Diagnosis not present

## 2017-06-02 DIAGNOSIS — E118 Type 2 diabetes mellitus with unspecified complications: Secondary | ICD-10-CM | POA: Diagnosis not present

## 2017-06-02 DIAGNOSIS — D509 Iron deficiency anemia, unspecified: Secondary | ICD-10-CM | POA: Diagnosis not present

## 2017-06-02 DIAGNOSIS — N186 End stage renal disease: Secondary | ICD-10-CM | POA: Diagnosis not present

## 2017-06-02 DIAGNOSIS — N2581 Secondary hyperparathyroidism of renal origin: Secondary | ICD-10-CM | POA: Diagnosis not present

## 2017-06-04 DIAGNOSIS — D509 Iron deficiency anemia, unspecified: Secondary | ICD-10-CM | POA: Diagnosis not present

## 2017-06-04 DIAGNOSIS — N186 End stage renal disease: Secondary | ICD-10-CM | POA: Diagnosis not present

## 2017-06-04 DIAGNOSIS — N041 Nephrotic syndrome with focal and segmental glomerular lesions: Secondary | ICD-10-CM | POA: Diagnosis not present

## 2017-06-04 DIAGNOSIS — E118 Type 2 diabetes mellitus with unspecified complications: Secondary | ICD-10-CM | POA: Diagnosis not present

## 2017-06-04 DIAGNOSIS — N2581 Secondary hyperparathyroidism of renal origin: Secondary | ICD-10-CM | POA: Diagnosis not present

## 2017-06-04 DIAGNOSIS — Z992 Dependence on renal dialysis: Secondary | ICD-10-CM | POA: Diagnosis not present

## 2017-06-07 DIAGNOSIS — D509 Iron deficiency anemia, unspecified: Secondary | ICD-10-CM | POA: Diagnosis not present

## 2017-06-07 DIAGNOSIS — E118 Type 2 diabetes mellitus with unspecified complications: Secondary | ICD-10-CM | POA: Diagnosis not present

## 2017-06-07 DIAGNOSIS — N186 End stage renal disease: Secondary | ICD-10-CM | POA: Diagnosis not present

## 2017-06-07 DIAGNOSIS — N2581 Secondary hyperparathyroidism of renal origin: Secondary | ICD-10-CM | POA: Diagnosis not present

## 2017-06-09 DIAGNOSIS — N2581 Secondary hyperparathyroidism of renal origin: Secondary | ICD-10-CM | POA: Diagnosis not present

## 2017-06-09 DIAGNOSIS — E118 Type 2 diabetes mellitus with unspecified complications: Secondary | ICD-10-CM | POA: Diagnosis not present

## 2017-06-09 DIAGNOSIS — D509 Iron deficiency anemia, unspecified: Secondary | ICD-10-CM | POA: Diagnosis not present

## 2017-06-09 DIAGNOSIS — N186 End stage renal disease: Secondary | ICD-10-CM | POA: Diagnosis not present

## 2017-06-11 DIAGNOSIS — N2581 Secondary hyperparathyroidism of renal origin: Secondary | ICD-10-CM | POA: Diagnosis not present

## 2017-06-11 DIAGNOSIS — N186 End stage renal disease: Secondary | ICD-10-CM | POA: Diagnosis not present

## 2017-06-11 DIAGNOSIS — D509 Iron deficiency anemia, unspecified: Secondary | ICD-10-CM | POA: Diagnosis not present

## 2017-06-11 DIAGNOSIS — E118 Type 2 diabetes mellitus with unspecified complications: Secondary | ICD-10-CM | POA: Diagnosis not present

## 2017-06-14 DIAGNOSIS — N2581 Secondary hyperparathyroidism of renal origin: Secondary | ICD-10-CM | POA: Diagnosis not present

## 2017-06-14 DIAGNOSIS — E118 Type 2 diabetes mellitus with unspecified complications: Secondary | ICD-10-CM | POA: Diagnosis not present

## 2017-06-14 DIAGNOSIS — D509 Iron deficiency anemia, unspecified: Secondary | ICD-10-CM | POA: Diagnosis not present

## 2017-06-14 DIAGNOSIS — N186 End stage renal disease: Secondary | ICD-10-CM | POA: Diagnosis not present

## 2017-06-16 DIAGNOSIS — N186 End stage renal disease: Secondary | ICD-10-CM | POA: Diagnosis not present

## 2017-06-16 DIAGNOSIS — E118 Type 2 diabetes mellitus with unspecified complications: Secondary | ICD-10-CM | POA: Diagnosis not present

## 2017-06-16 DIAGNOSIS — D509 Iron deficiency anemia, unspecified: Secondary | ICD-10-CM | POA: Diagnosis not present

## 2017-06-16 DIAGNOSIS — N2581 Secondary hyperparathyroidism of renal origin: Secondary | ICD-10-CM | POA: Diagnosis not present

## 2017-06-17 ENCOUNTER — Ambulatory Visit (HOSPITAL_COMMUNITY): Payer: Medicare Other | Admitting: Oncology

## 2017-06-17 DIAGNOSIS — N2581 Secondary hyperparathyroidism of renal origin: Secondary | ICD-10-CM | POA: Diagnosis not present

## 2017-06-17 DIAGNOSIS — D509 Iron deficiency anemia, unspecified: Secondary | ICD-10-CM | POA: Diagnosis not present

## 2017-06-17 DIAGNOSIS — E118 Type 2 diabetes mellitus with unspecified complications: Secondary | ICD-10-CM | POA: Diagnosis not present

## 2017-06-17 DIAGNOSIS — N186 End stage renal disease: Secondary | ICD-10-CM | POA: Diagnosis not present

## 2017-06-21 DIAGNOSIS — E118 Type 2 diabetes mellitus with unspecified complications: Secondary | ICD-10-CM | POA: Diagnosis not present

## 2017-06-21 DIAGNOSIS — N186 End stage renal disease: Secondary | ICD-10-CM | POA: Diagnosis not present

## 2017-06-21 DIAGNOSIS — N2581 Secondary hyperparathyroidism of renal origin: Secondary | ICD-10-CM | POA: Diagnosis not present

## 2017-06-21 DIAGNOSIS — D509 Iron deficiency anemia, unspecified: Secondary | ICD-10-CM | POA: Diagnosis not present

## 2017-06-23 DIAGNOSIS — E118 Type 2 diabetes mellitus with unspecified complications: Secondary | ICD-10-CM | POA: Diagnosis not present

## 2017-06-23 DIAGNOSIS — N2581 Secondary hyperparathyroidism of renal origin: Secondary | ICD-10-CM | POA: Diagnosis not present

## 2017-06-23 DIAGNOSIS — N186 End stage renal disease: Secondary | ICD-10-CM | POA: Diagnosis not present

## 2017-06-23 DIAGNOSIS — D509 Iron deficiency anemia, unspecified: Secondary | ICD-10-CM | POA: Diagnosis not present

## 2017-06-25 DIAGNOSIS — N2581 Secondary hyperparathyroidism of renal origin: Secondary | ICD-10-CM | POA: Diagnosis not present

## 2017-06-25 DIAGNOSIS — D509 Iron deficiency anemia, unspecified: Secondary | ICD-10-CM | POA: Diagnosis not present

## 2017-06-25 DIAGNOSIS — N186 End stage renal disease: Secondary | ICD-10-CM | POA: Diagnosis not present

## 2017-06-25 DIAGNOSIS — E118 Type 2 diabetes mellitus with unspecified complications: Secondary | ICD-10-CM | POA: Diagnosis not present

## 2017-06-28 DIAGNOSIS — E118 Type 2 diabetes mellitus with unspecified complications: Secondary | ICD-10-CM | POA: Diagnosis not present

## 2017-06-28 DIAGNOSIS — N186 End stage renal disease: Secondary | ICD-10-CM | POA: Diagnosis not present

## 2017-06-28 DIAGNOSIS — N2581 Secondary hyperparathyroidism of renal origin: Secondary | ICD-10-CM | POA: Diagnosis not present

## 2017-06-28 DIAGNOSIS — D509 Iron deficiency anemia, unspecified: Secondary | ICD-10-CM | POA: Diagnosis not present

## 2017-06-30 DIAGNOSIS — N2581 Secondary hyperparathyroidism of renal origin: Secondary | ICD-10-CM | POA: Diagnosis not present

## 2017-06-30 DIAGNOSIS — N186 End stage renal disease: Secondary | ICD-10-CM | POA: Diagnosis not present

## 2017-06-30 DIAGNOSIS — E118 Type 2 diabetes mellitus with unspecified complications: Secondary | ICD-10-CM | POA: Diagnosis not present

## 2017-06-30 DIAGNOSIS — D509 Iron deficiency anemia, unspecified: Secondary | ICD-10-CM | POA: Diagnosis not present

## 2017-07-02 DIAGNOSIS — D509 Iron deficiency anemia, unspecified: Secondary | ICD-10-CM | POA: Diagnosis not present

## 2017-07-02 DIAGNOSIS — E118 Type 2 diabetes mellitus with unspecified complications: Secondary | ICD-10-CM | POA: Diagnosis not present

## 2017-07-02 DIAGNOSIS — N186 End stage renal disease: Secondary | ICD-10-CM | POA: Diagnosis not present

## 2017-07-02 DIAGNOSIS — N2581 Secondary hyperparathyroidism of renal origin: Secondary | ICD-10-CM | POA: Diagnosis not present

## 2017-07-05 DIAGNOSIS — N041 Nephrotic syndrome with focal and segmental glomerular lesions: Secondary | ICD-10-CM | POA: Diagnosis not present

## 2017-07-05 DIAGNOSIS — N186 End stage renal disease: Secondary | ICD-10-CM | POA: Diagnosis not present

## 2017-07-05 DIAGNOSIS — Z992 Dependence on renal dialysis: Secondary | ICD-10-CM | POA: Diagnosis not present

## 2017-07-05 DIAGNOSIS — N2581 Secondary hyperparathyroidism of renal origin: Secondary | ICD-10-CM | POA: Diagnosis not present

## 2017-07-05 DIAGNOSIS — E118 Type 2 diabetes mellitus with unspecified complications: Secondary | ICD-10-CM | POA: Diagnosis not present

## 2017-07-05 DIAGNOSIS — D509 Iron deficiency anemia, unspecified: Secondary | ICD-10-CM | POA: Diagnosis not present

## 2017-07-07 DIAGNOSIS — E118 Type 2 diabetes mellitus with unspecified complications: Secondary | ICD-10-CM | POA: Diagnosis not present

## 2017-07-07 DIAGNOSIS — N186 End stage renal disease: Secondary | ICD-10-CM | POA: Diagnosis not present

## 2017-07-07 DIAGNOSIS — D509 Iron deficiency anemia, unspecified: Secondary | ICD-10-CM | POA: Diagnosis not present

## 2017-07-07 DIAGNOSIS — D631 Anemia in chronic kidney disease: Secondary | ICD-10-CM | POA: Diagnosis not present

## 2017-07-07 DIAGNOSIS — N2581 Secondary hyperparathyroidism of renal origin: Secondary | ICD-10-CM | POA: Diagnosis not present

## 2017-07-09 DIAGNOSIS — D631 Anemia in chronic kidney disease: Secondary | ICD-10-CM | POA: Diagnosis not present

## 2017-07-09 DIAGNOSIS — N2581 Secondary hyperparathyroidism of renal origin: Secondary | ICD-10-CM | POA: Diagnosis not present

## 2017-07-09 DIAGNOSIS — E118 Type 2 diabetes mellitus with unspecified complications: Secondary | ICD-10-CM | POA: Diagnosis not present

## 2017-07-09 DIAGNOSIS — D509 Iron deficiency anemia, unspecified: Secondary | ICD-10-CM | POA: Diagnosis not present

## 2017-07-09 DIAGNOSIS — N186 End stage renal disease: Secondary | ICD-10-CM | POA: Diagnosis not present

## 2017-07-12 DIAGNOSIS — N186 End stage renal disease: Secondary | ICD-10-CM | POA: Diagnosis not present

## 2017-07-12 DIAGNOSIS — E118 Type 2 diabetes mellitus with unspecified complications: Secondary | ICD-10-CM | POA: Diagnosis not present

## 2017-07-12 DIAGNOSIS — N2581 Secondary hyperparathyroidism of renal origin: Secondary | ICD-10-CM | POA: Diagnosis not present

## 2017-07-12 DIAGNOSIS — D509 Iron deficiency anemia, unspecified: Secondary | ICD-10-CM | POA: Diagnosis not present

## 2017-07-12 DIAGNOSIS — D631 Anemia in chronic kidney disease: Secondary | ICD-10-CM | POA: Diagnosis not present

## 2017-07-14 DIAGNOSIS — D631 Anemia in chronic kidney disease: Secondary | ICD-10-CM | POA: Diagnosis not present

## 2017-07-14 DIAGNOSIS — D509 Iron deficiency anemia, unspecified: Secondary | ICD-10-CM | POA: Diagnosis not present

## 2017-07-14 DIAGNOSIS — N186 End stage renal disease: Secondary | ICD-10-CM | POA: Diagnosis not present

## 2017-07-14 DIAGNOSIS — N2581 Secondary hyperparathyroidism of renal origin: Secondary | ICD-10-CM | POA: Diagnosis not present

## 2017-07-14 DIAGNOSIS — E118 Type 2 diabetes mellitus with unspecified complications: Secondary | ICD-10-CM | POA: Diagnosis not present

## 2017-07-16 DIAGNOSIS — N186 End stage renal disease: Secondary | ICD-10-CM | POA: Diagnosis not present

## 2017-07-16 DIAGNOSIS — D631 Anemia in chronic kidney disease: Secondary | ICD-10-CM | POA: Diagnosis not present

## 2017-07-16 DIAGNOSIS — N2581 Secondary hyperparathyroidism of renal origin: Secondary | ICD-10-CM | POA: Diagnosis not present

## 2017-07-16 DIAGNOSIS — E118 Type 2 diabetes mellitus with unspecified complications: Secondary | ICD-10-CM | POA: Diagnosis not present

## 2017-07-16 DIAGNOSIS — D509 Iron deficiency anemia, unspecified: Secondary | ICD-10-CM | POA: Diagnosis not present

## 2017-07-19 DIAGNOSIS — N186 End stage renal disease: Secondary | ICD-10-CM | POA: Diagnosis not present

## 2017-07-19 DIAGNOSIS — E118 Type 2 diabetes mellitus with unspecified complications: Secondary | ICD-10-CM | POA: Diagnosis not present

## 2017-07-19 DIAGNOSIS — D631 Anemia in chronic kidney disease: Secondary | ICD-10-CM | POA: Diagnosis not present

## 2017-07-19 DIAGNOSIS — N2581 Secondary hyperparathyroidism of renal origin: Secondary | ICD-10-CM | POA: Diagnosis not present

## 2017-07-19 DIAGNOSIS — D509 Iron deficiency anemia, unspecified: Secondary | ICD-10-CM | POA: Diagnosis not present

## 2017-07-21 DIAGNOSIS — N186 End stage renal disease: Secondary | ICD-10-CM | POA: Diagnosis not present

## 2017-07-21 DIAGNOSIS — D509 Iron deficiency anemia, unspecified: Secondary | ICD-10-CM | POA: Diagnosis not present

## 2017-07-21 DIAGNOSIS — N2581 Secondary hyperparathyroidism of renal origin: Secondary | ICD-10-CM | POA: Diagnosis not present

## 2017-07-21 DIAGNOSIS — D631 Anemia in chronic kidney disease: Secondary | ICD-10-CM | POA: Diagnosis not present

## 2017-07-21 DIAGNOSIS — E118 Type 2 diabetes mellitus with unspecified complications: Secondary | ICD-10-CM | POA: Diagnosis not present

## 2017-07-23 DIAGNOSIS — D631 Anemia in chronic kidney disease: Secondary | ICD-10-CM | POA: Diagnosis not present

## 2017-07-23 DIAGNOSIS — D509 Iron deficiency anemia, unspecified: Secondary | ICD-10-CM | POA: Diagnosis not present

## 2017-07-23 DIAGNOSIS — N186 End stage renal disease: Secondary | ICD-10-CM | POA: Diagnosis not present

## 2017-07-23 DIAGNOSIS — N2581 Secondary hyperparathyroidism of renal origin: Secondary | ICD-10-CM | POA: Diagnosis not present

## 2017-07-23 DIAGNOSIS — E118 Type 2 diabetes mellitus with unspecified complications: Secondary | ICD-10-CM | POA: Diagnosis not present

## 2017-07-26 DIAGNOSIS — D509 Iron deficiency anemia, unspecified: Secondary | ICD-10-CM | POA: Diagnosis not present

## 2017-07-26 DIAGNOSIS — D631 Anemia in chronic kidney disease: Secondary | ICD-10-CM | POA: Diagnosis not present

## 2017-07-26 DIAGNOSIS — N186 End stage renal disease: Secondary | ICD-10-CM | POA: Diagnosis not present

## 2017-07-26 DIAGNOSIS — N2581 Secondary hyperparathyroidism of renal origin: Secondary | ICD-10-CM | POA: Diagnosis not present

## 2017-07-26 DIAGNOSIS — E118 Type 2 diabetes mellitus with unspecified complications: Secondary | ICD-10-CM | POA: Diagnosis not present

## 2017-07-28 DIAGNOSIS — N186 End stage renal disease: Secondary | ICD-10-CM | POA: Diagnosis not present

## 2017-07-28 DIAGNOSIS — N2581 Secondary hyperparathyroidism of renal origin: Secondary | ICD-10-CM | POA: Diagnosis not present

## 2017-07-28 DIAGNOSIS — E118 Type 2 diabetes mellitus with unspecified complications: Secondary | ICD-10-CM | POA: Diagnosis not present

## 2017-07-28 DIAGNOSIS — D509 Iron deficiency anemia, unspecified: Secondary | ICD-10-CM | POA: Diagnosis not present

## 2017-07-28 DIAGNOSIS — D631 Anemia in chronic kidney disease: Secondary | ICD-10-CM | POA: Diagnosis not present

## 2017-07-30 DIAGNOSIS — D509 Iron deficiency anemia, unspecified: Secondary | ICD-10-CM | POA: Diagnosis not present

## 2017-07-30 DIAGNOSIS — N2581 Secondary hyperparathyroidism of renal origin: Secondary | ICD-10-CM | POA: Diagnosis not present

## 2017-07-30 DIAGNOSIS — E118 Type 2 diabetes mellitus with unspecified complications: Secondary | ICD-10-CM | POA: Diagnosis not present

## 2017-07-30 DIAGNOSIS — N186 End stage renal disease: Secondary | ICD-10-CM | POA: Diagnosis not present

## 2017-07-30 DIAGNOSIS — D631 Anemia in chronic kidney disease: Secondary | ICD-10-CM | POA: Diagnosis not present

## 2017-08-02 DIAGNOSIS — N186 End stage renal disease: Secondary | ICD-10-CM | POA: Diagnosis not present

## 2017-08-02 DIAGNOSIS — D631 Anemia in chronic kidney disease: Secondary | ICD-10-CM | POA: Diagnosis not present

## 2017-08-02 DIAGNOSIS — D509 Iron deficiency anemia, unspecified: Secondary | ICD-10-CM | POA: Diagnosis not present

## 2017-08-02 DIAGNOSIS — E118 Type 2 diabetes mellitus with unspecified complications: Secondary | ICD-10-CM | POA: Diagnosis not present

## 2017-08-02 DIAGNOSIS — N2581 Secondary hyperparathyroidism of renal origin: Secondary | ICD-10-CM | POA: Diagnosis not present

## 2017-08-04 DIAGNOSIS — D509 Iron deficiency anemia, unspecified: Secondary | ICD-10-CM | POA: Diagnosis not present

## 2017-08-04 DIAGNOSIS — N186 End stage renal disease: Secondary | ICD-10-CM | POA: Diagnosis not present

## 2017-08-04 DIAGNOSIS — N2581 Secondary hyperparathyroidism of renal origin: Secondary | ICD-10-CM | POA: Diagnosis not present

## 2017-08-04 DIAGNOSIS — D631 Anemia in chronic kidney disease: Secondary | ICD-10-CM | POA: Diagnosis not present

## 2017-08-04 DIAGNOSIS — E118 Type 2 diabetes mellitus with unspecified complications: Secondary | ICD-10-CM | POA: Diagnosis not present

## 2017-08-05 DIAGNOSIS — N041 Nephrotic syndrome with focal and segmental glomerular lesions: Secondary | ICD-10-CM | POA: Diagnosis not present

## 2017-08-05 DIAGNOSIS — N186 End stage renal disease: Secondary | ICD-10-CM | POA: Diagnosis not present

## 2017-08-05 DIAGNOSIS — Z992 Dependence on renal dialysis: Secondary | ICD-10-CM | POA: Diagnosis not present

## 2017-08-06 DIAGNOSIS — N186 End stage renal disease: Secondary | ICD-10-CM | POA: Diagnosis not present

## 2017-08-06 DIAGNOSIS — N2581 Secondary hyperparathyroidism of renal origin: Secondary | ICD-10-CM | POA: Diagnosis not present

## 2017-08-06 DIAGNOSIS — D631 Anemia in chronic kidney disease: Secondary | ICD-10-CM | POA: Diagnosis not present

## 2017-08-06 DIAGNOSIS — Z23 Encounter for immunization: Secondary | ICD-10-CM | POA: Diagnosis not present

## 2017-08-06 DIAGNOSIS — E118 Type 2 diabetes mellitus with unspecified complications: Secondary | ICD-10-CM | POA: Diagnosis not present

## 2017-08-09 DIAGNOSIS — Z23 Encounter for immunization: Secondary | ICD-10-CM | POA: Diagnosis not present

## 2017-08-09 DIAGNOSIS — N186 End stage renal disease: Secondary | ICD-10-CM | POA: Diagnosis not present

## 2017-08-09 DIAGNOSIS — E118 Type 2 diabetes mellitus with unspecified complications: Secondary | ICD-10-CM | POA: Diagnosis not present

## 2017-08-09 DIAGNOSIS — D631 Anemia in chronic kidney disease: Secondary | ICD-10-CM | POA: Diagnosis not present

## 2017-08-09 DIAGNOSIS — N2581 Secondary hyperparathyroidism of renal origin: Secondary | ICD-10-CM | POA: Diagnosis not present

## 2017-08-11 DIAGNOSIS — D631 Anemia in chronic kidney disease: Secondary | ICD-10-CM | POA: Diagnosis not present

## 2017-08-11 DIAGNOSIS — N2581 Secondary hyperparathyroidism of renal origin: Secondary | ICD-10-CM | POA: Diagnosis not present

## 2017-08-11 DIAGNOSIS — Z23 Encounter for immunization: Secondary | ICD-10-CM | POA: Diagnosis not present

## 2017-08-11 DIAGNOSIS — N186 End stage renal disease: Secondary | ICD-10-CM | POA: Diagnosis not present

## 2017-08-11 DIAGNOSIS — E118 Type 2 diabetes mellitus with unspecified complications: Secondary | ICD-10-CM | POA: Diagnosis not present

## 2017-08-13 DIAGNOSIS — D631 Anemia in chronic kidney disease: Secondary | ICD-10-CM | POA: Diagnosis not present

## 2017-08-13 DIAGNOSIS — E118 Type 2 diabetes mellitus with unspecified complications: Secondary | ICD-10-CM | POA: Diagnosis not present

## 2017-08-13 DIAGNOSIS — N2581 Secondary hyperparathyroidism of renal origin: Secondary | ICD-10-CM | POA: Diagnosis not present

## 2017-08-13 DIAGNOSIS — N186 End stage renal disease: Secondary | ICD-10-CM | POA: Diagnosis not present

## 2017-08-13 DIAGNOSIS — Z23 Encounter for immunization: Secondary | ICD-10-CM | POA: Diagnosis not present

## 2017-08-15 DIAGNOSIS — H25813 Combined forms of age-related cataract, bilateral: Secondary | ICD-10-CM | POA: Diagnosis not present

## 2017-08-15 DIAGNOSIS — E113293 Type 2 diabetes mellitus with mild nonproliferative diabetic retinopathy without macular edema, bilateral: Secondary | ICD-10-CM | POA: Diagnosis not present

## 2017-08-15 DIAGNOSIS — Z794 Long term (current) use of insulin: Secondary | ICD-10-CM | POA: Diagnosis not present

## 2017-08-15 LAB — HM DIABETES EYE EXAM

## 2017-08-16 DIAGNOSIS — E118 Type 2 diabetes mellitus with unspecified complications: Secondary | ICD-10-CM | POA: Diagnosis not present

## 2017-08-16 DIAGNOSIS — D631 Anemia in chronic kidney disease: Secondary | ICD-10-CM | POA: Diagnosis not present

## 2017-08-16 DIAGNOSIS — N186 End stage renal disease: Secondary | ICD-10-CM | POA: Diagnosis not present

## 2017-08-16 DIAGNOSIS — Z23 Encounter for immunization: Secondary | ICD-10-CM | POA: Diagnosis not present

## 2017-08-16 DIAGNOSIS — N2581 Secondary hyperparathyroidism of renal origin: Secondary | ICD-10-CM | POA: Diagnosis not present

## 2017-08-18 DIAGNOSIS — N186 End stage renal disease: Secondary | ICD-10-CM | POA: Diagnosis not present

## 2017-08-18 DIAGNOSIS — Z23 Encounter for immunization: Secondary | ICD-10-CM | POA: Diagnosis not present

## 2017-08-18 DIAGNOSIS — D631 Anemia in chronic kidney disease: Secondary | ICD-10-CM | POA: Diagnosis not present

## 2017-08-18 DIAGNOSIS — E118 Type 2 diabetes mellitus with unspecified complications: Secondary | ICD-10-CM | POA: Diagnosis not present

## 2017-08-18 DIAGNOSIS — N2581 Secondary hyperparathyroidism of renal origin: Secondary | ICD-10-CM | POA: Diagnosis not present

## 2017-08-20 DIAGNOSIS — N186 End stage renal disease: Secondary | ICD-10-CM | POA: Diagnosis not present

## 2017-08-20 DIAGNOSIS — N2581 Secondary hyperparathyroidism of renal origin: Secondary | ICD-10-CM | POA: Diagnosis not present

## 2017-08-20 DIAGNOSIS — E118 Type 2 diabetes mellitus with unspecified complications: Secondary | ICD-10-CM | POA: Diagnosis not present

## 2017-08-20 DIAGNOSIS — Z23 Encounter for immunization: Secondary | ICD-10-CM | POA: Diagnosis not present

## 2017-08-20 DIAGNOSIS — D631 Anemia in chronic kidney disease: Secondary | ICD-10-CM | POA: Diagnosis not present

## 2017-08-23 DIAGNOSIS — B351 Tinea unguium: Secondary | ICD-10-CM | POA: Diagnosis not present

## 2017-08-23 DIAGNOSIS — L84 Corns and callosities: Secondary | ICD-10-CM | POA: Diagnosis not present

## 2017-08-23 DIAGNOSIS — E118 Type 2 diabetes mellitus with unspecified complications: Secondary | ICD-10-CM | POA: Diagnosis not present

## 2017-08-23 DIAGNOSIS — Z23 Encounter for immunization: Secondary | ICD-10-CM | POA: Diagnosis not present

## 2017-08-23 DIAGNOSIS — N186 End stage renal disease: Secondary | ICD-10-CM | POA: Diagnosis not present

## 2017-08-23 DIAGNOSIS — E1142 Type 2 diabetes mellitus with diabetic polyneuropathy: Secondary | ICD-10-CM | POA: Diagnosis not present

## 2017-08-23 DIAGNOSIS — N2581 Secondary hyperparathyroidism of renal origin: Secondary | ICD-10-CM | POA: Diagnosis not present

## 2017-08-23 DIAGNOSIS — M79676 Pain in unspecified toe(s): Secondary | ICD-10-CM | POA: Diagnosis not present

## 2017-08-23 DIAGNOSIS — D631 Anemia in chronic kidney disease: Secondary | ICD-10-CM | POA: Diagnosis not present

## 2017-08-25 DIAGNOSIS — D631 Anemia in chronic kidney disease: Secondary | ICD-10-CM | POA: Diagnosis not present

## 2017-08-25 DIAGNOSIS — Z23 Encounter for immunization: Secondary | ICD-10-CM | POA: Diagnosis not present

## 2017-08-25 DIAGNOSIS — N186 End stage renal disease: Secondary | ICD-10-CM | POA: Diagnosis not present

## 2017-08-25 DIAGNOSIS — E118 Type 2 diabetes mellitus with unspecified complications: Secondary | ICD-10-CM | POA: Diagnosis not present

## 2017-08-25 DIAGNOSIS — N2581 Secondary hyperparathyroidism of renal origin: Secondary | ICD-10-CM | POA: Diagnosis not present

## 2017-08-27 DIAGNOSIS — Z23 Encounter for immunization: Secondary | ICD-10-CM | POA: Diagnosis not present

## 2017-08-27 DIAGNOSIS — N186 End stage renal disease: Secondary | ICD-10-CM | POA: Diagnosis not present

## 2017-08-27 DIAGNOSIS — E118 Type 2 diabetes mellitus with unspecified complications: Secondary | ICD-10-CM | POA: Diagnosis not present

## 2017-08-27 DIAGNOSIS — D631 Anemia in chronic kidney disease: Secondary | ICD-10-CM | POA: Diagnosis not present

## 2017-08-27 DIAGNOSIS — N2581 Secondary hyperparathyroidism of renal origin: Secondary | ICD-10-CM | POA: Diagnosis not present

## 2017-08-29 DIAGNOSIS — H2513 Age-related nuclear cataract, bilateral: Secondary | ICD-10-CM | POA: Diagnosis not present

## 2017-08-29 DIAGNOSIS — E113593 Type 2 diabetes mellitus with proliferative diabetic retinopathy without macular edema, bilateral: Secondary | ICD-10-CM | POA: Diagnosis not present

## 2017-08-30 DIAGNOSIS — N186 End stage renal disease: Secondary | ICD-10-CM | POA: Diagnosis not present

## 2017-08-30 DIAGNOSIS — E118 Type 2 diabetes mellitus with unspecified complications: Secondary | ICD-10-CM | POA: Diagnosis not present

## 2017-08-30 DIAGNOSIS — Z23 Encounter for immunization: Secondary | ICD-10-CM | POA: Diagnosis not present

## 2017-08-30 DIAGNOSIS — D631 Anemia in chronic kidney disease: Secondary | ICD-10-CM | POA: Diagnosis not present

## 2017-08-30 DIAGNOSIS — N2581 Secondary hyperparathyroidism of renal origin: Secondary | ICD-10-CM | POA: Diagnosis not present

## 2017-09-01 DIAGNOSIS — D631 Anemia in chronic kidney disease: Secondary | ICD-10-CM | POA: Diagnosis not present

## 2017-09-01 DIAGNOSIS — N186 End stage renal disease: Secondary | ICD-10-CM | POA: Diagnosis not present

## 2017-09-01 DIAGNOSIS — N2581 Secondary hyperparathyroidism of renal origin: Secondary | ICD-10-CM | POA: Diagnosis not present

## 2017-09-01 DIAGNOSIS — E118 Type 2 diabetes mellitus with unspecified complications: Secondary | ICD-10-CM | POA: Diagnosis not present

## 2017-09-01 DIAGNOSIS — Z23 Encounter for immunization: Secondary | ICD-10-CM | POA: Diagnosis not present

## 2017-09-03 DIAGNOSIS — N186 End stage renal disease: Secondary | ICD-10-CM | POA: Diagnosis not present

## 2017-09-03 DIAGNOSIS — Z23 Encounter for immunization: Secondary | ICD-10-CM | POA: Diagnosis not present

## 2017-09-03 DIAGNOSIS — E118 Type 2 diabetes mellitus with unspecified complications: Secondary | ICD-10-CM | POA: Diagnosis not present

## 2017-09-03 DIAGNOSIS — N2581 Secondary hyperparathyroidism of renal origin: Secondary | ICD-10-CM | POA: Diagnosis not present

## 2017-09-03 DIAGNOSIS — D631 Anemia in chronic kidney disease: Secondary | ICD-10-CM | POA: Diagnosis not present

## 2017-09-04 DIAGNOSIS — N041 Nephrotic syndrome with focal and segmental glomerular lesions: Secondary | ICD-10-CM | POA: Diagnosis not present

## 2017-09-04 DIAGNOSIS — N186 End stage renal disease: Secondary | ICD-10-CM | POA: Diagnosis not present

## 2017-09-04 DIAGNOSIS — Z992 Dependence on renal dialysis: Secondary | ICD-10-CM | POA: Diagnosis not present

## 2017-09-05 ENCOUNTER — Encounter: Payer: Self-pay | Admitting: Family Medicine

## 2017-09-05 ENCOUNTER — Ambulatory Visit (INDEPENDENT_AMBULATORY_CARE_PROVIDER_SITE_OTHER): Payer: Medicare Other | Admitting: Family Medicine

## 2017-09-05 VITALS — BP 188/84 | HR 80 | Temp 98.2°F | Ht 65.0 in | Wt 192.0 lb

## 2017-09-05 DIAGNOSIS — E118 Type 2 diabetes mellitus with unspecified complications: Secondary | ICD-10-CM

## 2017-09-05 DIAGNOSIS — Z794 Long term (current) use of insulin: Secondary | ICD-10-CM | POA: Diagnosis not present

## 2017-09-05 DIAGNOSIS — Z992 Dependence on renal dialysis: Secondary | ICD-10-CM | POA: Diagnosis not present

## 2017-09-05 DIAGNOSIS — I120 Hypertensive chronic kidney disease with stage 5 chronic kidney disease or end stage renal disease: Secondary | ICD-10-CM | POA: Diagnosis not present

## 2017-09-05 DIAGNOSIS — I10 Essential (primary) hypertension: Secondary | ICD-10-CM

## 2017-09-05 DIAGNOSIS — I5022 Chronic systolic (congestive) heart failure: Secondary | ICD-10-CM | POA: Diagnosis not present

## 2017-09-05 DIAGNOSIS — E1122 Type 2 diabetes mellitus with diabetic chronic kidney disease: Secondary | ICD-10-CM | POA: Diagnosis not present

## 2017-09-05 DIAGNOSIS — N186 End stage renal disease: Secondary | ICD-10-CM

## 2017-09-05 DIAGNOSIS — E1140 Type 2 diabetes mellitus with diabetic neuropathy, unspecified: Secondary | ICD-10-CM | POA: Diagnosis not present

## 2017-09-05 LAB — BAYER DCA HB A1C WAIVED: HB A1C (BAYER DCA - WAIVED): 6.2 % (ref <7.0)

## 2017-09-05 MED ORDER — GABAPENTIN 300 MG PO CAPS: 300.0000 mg | ORAL_CAPSULE | Freq: Every day | ORAL | 2 refills | Status: DC

## 2017-09-05 NOTE — Progress Notes (Signed)
BP (!) 188/84   Pulse 80   Temp 98.2 F (36.8 C) (Oral)   Ht 5' 5" (1.651 m)   Wt 192 lb (87.1 kg)   BMI 31.95 kg/m    Subjective:    Patient ID: James [name], male    DOB: Mar 31, 1956, 61 y.o.   MRN: [id number]  HPI: James [name] is a 61 y.o. male presenting on 09/05/2017 for Hypertension (follow up) and Diabetes (blood sugars are varying, does not always use insulin, has had some low numbers)   HPI Type 2 diabetes mellitus Patient comes in today for recheck of his diabetes. Patient has been currently taking Levemir and NovoLog, he says he has not taken his Levemir in 2 months and is just using the NovoLog with meals, 6 units 3 times a day. Patient is not currently on an ACE inhibitor/ARB because he is already on dialysis. Patient has not seen an ophthalmologist this year. Patient has neuropathy which is not well controlled but cannot go up on the dose of the gabapentin because of his dialysis and renal failure, we will monitor this for now.   Hypertension And CHF Patient is currently on Imdur, and their blood pressure today is 188/84, will defer to nephrology for management because of dialysis. Patient denies any lightheadedness or dizziness. Patient denies headaches, blurred vision, chest pains, shortness of breath, or weakness. Denies any side effects from medication and is content with current medication.   Chronic kidney failure Patient has chronic kidney failure and is on dialysis and is coming in today for blood work. He says he continues to do dialysis on Tuesdays and Thursdays and Saturdays. He says his fluid has been well controlled and does not have any swelling or shortness of breath or chest pain.  Relevant past medical, surgical, family and social history reviewed and updated as indicated. Interim medical history since our last visit reviewed. Allergies and medications reviewed and updated.  Review of Systems  Constitutional: Negative for chills and fever.  Eyes:  Negative for discharge.  Respiratory: Negative for shortness of breath and wheezing.   Cardiovascular: Negative for chest pain and leg swelling.  Gastrointestinal: Negative for abdominal pain.  Genitourinary: Negative for difficulty urinating.  Musculoskeletal: Negative for back pain and gait problem.  Skin: Negative for rash.  Neurological: Positive for numbness. Negative for dizziness, weakness and light-headedness.  All other systems reviewed and are negative.   Per HPI unless specifically indicated above     Objective:    BP (!) 188/84   Pulse 80   Temp 98.2 F (36.8 C) (Oral)   Ht 5' 5" (1.651 m)   Wt 192 lb (87.1 kg)   BMI 31.95 kg/m   Wt Readings from Last 3 Encounters:  09/05/17 192 lb (87.1 kg)  04/29/17 192 lb 8 oz (87.3 kg)  01/26/17 201 lb (91.2 kg)    Physical Exam  Constitutional: He is oriented to person, place, and time. He appears well-developed and well-nourished. No distress.  Eyes: Conjunctivae are normal. No scleral icterus.  Neck: Neck supple. No thyromegaly present.  Cardiovascular: Normal rate, regular rhythm, normal heart sounds and intact distal pulses.   No murmur heard. Pulmonary/Chest: Effort normal and breath sounds normal. No respiratory distress. He has no wheezes. He has no rales.  Musculoskeletal: Normal range of motion. He exhibits no edema.  Lymphadenopathy:    He has no cervical adenopathy.  Neurological: He is alert and oriented to person, place, and time. Coordination normal.  Skin:  BP (!) 188/84   Pulse 80   Temp 98.2 F (36.8 C) (Oral)   Ht 5' 5" (1.651 m)   Wt 192 lb (87.1 kg)   BMI 31.95 kg/m    Subjective:    Patient ID: James [name], male    DOB: Mar 31, 1956, 61 y.o.   MRN: [id number]  HPI: James [name] is a 61 y.o. male presenting on 09/05/2017 for Hypertension (follow up) and Diabetes (blood sugars are varying, does not always use insulin, has had some low numbers)   HPI Type 2 diabetes mellitus Patient comes in today for recheck of his diabetes. Patient has been currently taking Levemir and NovoLog, he says he has not taken his Levemir in 2 months and is just using the NovoLog with meals, 6 units 3 times a day. Patient is not currently on an ACE inhibitor/ARB because he is already on dialysis. Patient has not seen an ophthalmologist this year. Patient has neuropathy which is not well controlled but cannot go up on the dose of the gabapentin because of his dialysis and renal failure, we will monitor this for now.   Hypertension And CHF Patient is currently on Imdur, and their blood pressure today is 188/84, will defer to nephrology for management because of dialysis. Patient denies any lightheadedness or dizziness. Patient denies headaches, blurred vision, chest pains, shortness of breath, or weakness. Denies any side effects from medication and is content with current medication.   Chronic kidney failure Patient has chronic kidney failure and is on dialysis and is coming in today for blood work. He says he continues to do dialysis on Tuesdays and Thursdays and Saturdays. He says his fluid has been well controlled and does not have any swelling or shortness of breath or chest pain.  Relevant past medical, surgical, family and social history reviewed and updated as indicated. Interim medical history since our last visit reviewed. Allergies and medications reviewed and updated.  Review of Systems  Constitutional: Negative for chills and fever.  Eyes:  Negative for discharge.  Respiratory: Negative for shortness of breath and wheezing.   Cardiovascular: Negative for chest pain and leg swelling.  Gastrointestinal: Negative for abdominal pain.  Genitourinary: Negative for difficulty urinating.  Musculoskeletal: Negative for back pain and gait problem.  Skin: Negative for rash.  Neurological: Positive for numbness. Negative for dizziness, weakness and light-headedness.  All other systems reviewed and are negative.   Per HPI unless specifically indicated above     Objective:    BP (!) 188/84   Pulse 80   Temp 98.2 F (36.8 C) (Oral)   Ht 5' 5" (1.651 m)   Wt 192 lb (87.1 kg)   BMI 31.95 kg/m   Wt Readings from Last 3 Encounters:  09/05/17 192 lb (87.1 kg)  04/29/17 192 lb 8 oz (87.3 kg)  01/26/17 201 lb (91.2 kg)    Physical Exam  Constitutional: He is oriented to person, place, and time. He appears well-developed and well-nourished. No distress.  Eyes: Conjunctivae are normal. No scleral icterus.  Neck: Neck supple. No thyromegaly present.  Cardiovascular: Normal rate, regular rhythm, normal heart sounds and intact distal pulses.   No murmur heard. Pulmonary/Chest: Effort normal and breath sounds normal. No respiratory distress. He has no wheezes. He has no rales.  Musculoskeletal: Normal range of motion. He exhibits no edema.  Lymphadenopathy:    He has no cervical adenopathy.  Neurological: He is alert and oriented to person, place, and time. Coordination normal.  Skin:

## 2017-09-06 DIAGNOSIS — N186 End stage renal disease: Secondary | ICD-10-CM | POA: Diagnosis not present

## 2017-09-06 DIAGNOSIS — E118 Type 2 diabetes mellitus with unspecified complications: Secondary | ICD-10-CM | POA: Diagnosis not present

## 2017-09-06 DIAGNOSIS — N2581 Secondary hyperparathyroidism of renal origin: Secondary | ICD-10-CM | POA: Diagnosis not present

## 2017-09-06 LAB — CMP14+EGFR
ALK PHOS: 60 IU/L (ref 39–117)
ALT: 8 IU/L (ref 0–44)
AST: 17 IU/L (ref 0–40)
Albumin/Globulin Ratio: 0.8 — ABNORMAL LOW (ref 1.2–2.2)
Albumin: 3.9 g/dL (ref 3.5–5.5)
BUN/Creatinine Ratio: 5 — ABNORMAL LOW (ref 9–20)
BUN: 48 mg/dL — AB (ref 6–24)
Bilirubin Total: 0.6 mg/dL (ref 0.0–1.2)
CALCIUM: 9.3 mg/dL (ref 8.7–10.2)
CO2: 26 mmol/L (ref 20–29)
Chloride: 88 mmol/L — ABNORMAL LOW (ref 96–106)
Creatinine, Ser: 9.92 mg/dL (ref 0.76–1.27)
GFR calc Af Amer: 6 mL/min/{1.73_m2} — ABNORMAL LOW (ref >59)
GFR, EST NON AFRICAN AMERICAN: 5 mL/min/{1.73_m2} — AB (ref >59)
GLOBULIN, TOTAL: 4.9 g/dL — AB (ref 1.5–4.5)
GLUCOSE: 102 mg/dL — AB (ref 65–99)
Potassium: 4.5 mmol/L (ref 3.5–5.2)
SODIUM: 128 mmol/L — AB (ref 134–144)
Total Protein: 8.8 g/dL — ABNORMAL HIGH (ref 6.0–8.5)

## 2017-09-06 LAB — CBC WITH DIFFERENTIAL/PLATELET
BASOS: 1 %
Basophils Absolute: 0.1 10*3/uL (ref 0.0–0.2)
EOS (ABSOLUTE): 0.2 10*3/uL (ref 0.0–0.4)
Eos: 2 %
Hematocrit: 38.4 % (ref 37.5–51.0)
Hemoglobin: 13 g/dL (ref 13.0–17.7)
IMMATURE GRANS (ABS): 0 10*3/uL (ref 0.0–0.1)
IMMATURE GRANULOCYTES: 0 %
LYMPHS: 22 %
Lymphocytes Absolute: 1.6 10*3/uL (ref 0.7–3.1)
MCH: 31.5 pg (ref 26.6–33.0)
MCHC: 33.9 g/dL (ref 31.5–35.7)
MCV: 93 fL (ref 79–97)
MONOS ABS: 0.8 10*3/uL (ref 0.1–0.9)
Monocytes: 10 %
NEUTROS PCT: 65 %
Neutrophils Absolute: 4.8 10*3/uL (ref 1.4–7.0)
PLATELETS: 204 10*3/uL (ref 150–379)
RBC: 4.13 x10E6/uL — ABNORMAL LOW (ref 4.14–5.80)
RDW: 14.4 % (ref 12.3–15.4)
WBC: 7.4 10*3/uL (ref 3.4–10.8)

## 2017-09-06 LAB — LIPID PANEL
CHOL/HDL RATIO: 3.1 ratio (ref 0.0–5.0)
CHOLESTEROL TOTAL: 127 mg/dL (ref 100–199)
HDL: 41 mg/dL (ref >39)
LDL CALC: 68 mg/dL (ref 0–99)
Triglycerides: 90 mg/dL (ref 0–149)
VLDL CHOLESTEROL CAL: 18 mg/dL (ref 5–40)

## 2017-09-08 DIAGNOSIS — N2581 Secondary hyperparathyroidism of renal origin: Secondary | ICD-10-CM | POA: Diagnosis not present

## 2017-09-08 DIAGNOSIS — E118 Type 2 diabetes mellitus with unspecified complications: Secondary | ICD-10-CM | POA: Diagnosis not present

## 2017-09-08 DIAGNOSIS — N186 End stage renal disease: Secondary | ICD-10-CM | POA: Diagnosis not present

## 2017-09-10 DIAGNOSIS — N2581 Secondary hyperparathyroidism of renal origin: Secondary | ICD-10-CM | POA: Diagnosis not present

## 2017-09-10 DIAGNOSIS — E118 Type 2 diabetes mellitus with unspecified complications: Secondary | ICD-10-CM | POA: Diagnosis not present

## 2017-09-10 DIAGNOSIS — N186 End stage renal disease: Secondary | ICD-10-CM | POA: Diagnosis not present

## 2017-09-13 ENCOUNTER — Encounter (HOSPITAL_COMMUNITY): Payer: Medicare Other | Attending: Oncology | Admitting: Oncology

## 2017-09-13 ENCOUNTER — Encounter (HOSPITAL_COMMUNITY): Payer: Self-pay | Admitting: Oncology

## 2017-09-13 VITALS — BP 136/72 | HR 81 | Resp 16 | Ht 65.0 in | Wt 186.4 lb

## 2017-09-13 DIAGNOSIS — N2581 Secondary hyperparathyroidism of renal origin: Secondary | ICD-10-CM | POA: Diagnosis not present

## 2017-09-13 DIAGNOSIS — C9000 Multiple myeloma not having achieved remission: Secondary | ICD-10-CM | POA: Diagnosis not present

## 2017-09-13 DIAGNOSIS — E118 Type 2 diabetes mellitus with unspecified complications: Secondary | ICD-10-CM | POA: Diagnosis not present

## 2017-09-13 DIAGNOSIS — N186 End stage renal disease: Secondary | ICD-10-CM | POA: Diagnosis not present

## 2017-09-13 DIAGNOSIS — Z992 Dependence on renal dialysis: Secondary | ICD-10-CM | POA: Diagnosis not present

## 2017-09-13 NOTE — Progress Notes (Signed)
[facility] Initial Visit:  Patient Care Team: Dettinger, Fransisca Kaufmann, MD as PCP - General (Family Medicine) Jamal Maes, MD as Consulting Physician (Nephrology) Center, [facility]  CHIEF COMPLAINTS/PURPOSE OF CONSULTATION:  Smoldering myeloma  HISTORY OF PRESENTING ILLNESS: James [name] 61 y.o. male is here because of history of smoldering myeloma. He was last seen in our cancer center back in 2016. SPEP in 2016 demonstrated a single peak (M-spike) in the gamma region which may represent monoclonal protein. At that time he had a bone marrow biopsy that demonstrated 11% plasma cells. He has ESRD on HD T/Th/Sat and had a renal biopsy back in 2016 as well which demonstrated FSGS and diabetic glomerulosclerosis, no evidence of monoclonal protein deposition in the kidneys. It was suspected that his anemia was related to his ESRD. Patient denies any new bone pains. He denies any chest pain, shortness breath, abdominal pain, focal weakness, fatigue, decreased appetite.  INTERVAL HISTORY: Today patient presents for follow-up. He was last seen in May 2018 was supposed to follow-up 10 days later but did not show for his appointment.  Repeat myeloma workup on 04/29/2017 demonstrated M spike of 2.6 g/dL, immunofixation shows IgG monoclonal protein with kappa light chain specificity. SPEP on December 2016 demonstrated M spike of 2.8 g/dL. Kappa free light chain 109.1, lambda free light chain 33, Kappa/lambda light chain ratio 3.31. His kappa lambda light chain ratio from December 2016 was 3.99.  Today patient states that he feels well. He has no complaints. Denies any chest pain, shortness breath, abdominal pain, focal weakness. He denies any bone pain. Has chronic neuropathy in his feet. Appetite is stable and no weight changes.  Review of Systems  Constitutional: Negative for appetite change, chills, fatigue and fever.  HENT:   Negative for hearing loss, lump/mass, mouth  sores, sore throat and tinnitus.   Eyes: Negative for eye problems and icterus.  Respiratory: Negative for chest tightness, cough, hemoptysis, shortness of breath and wheezing.   Cardiovascular: Negative for chest pain, leg swelling and palpitations.  Gastrointestinal: Negative for abdominal distention, abdominal pain, blood in stool, constipation, diarrhea, nausea and vomiting.  Endocrine: Negative.  Negative for hot flashes.  Genitourinary: Negative for difficulty urinating, frequency and hematuria.   Musculoskeletal: Negative for arthralgias and neck pain.  Skin: Negative for itching and rash.  Neurological: Negative for dizziness, headaches and speech difficulty.       Neuropapthy in feet -chronic  Hematological: Negative for adenopathy. Does not bruise/bleed easily.  Psychiatric/Behavioral: Negative for confusion. The patient is not nervous/anxious.     MEDICAL HISTORY: Past Medical History:  Diagnosis Date  . Asthma    as a child  . CHF (congestive heart failure) ([address])   . Chronic kidney disease    TTHS  . Diabetes mellitus without complication (HCC)    Type II  . ESRD (end stage renal disease) ([place])   . Hypertension     SURGICAL HISTORY: Past Surgical History:  Procedure Laterality Date  . AV FISTULA PLACEMENT Left 04/08/2015   Procedure: LEFT RADIOCEPHALIC ARTERIOVENOUS (AV) FISTULA CREATION;  Surgeon: Elam Dutch, MD;  Location: [place];  Service: Vascular;  Laterality: Left;  . AV FISTULA PLACEMENT Right 09/15/2015   Procedure: RADIOCEPHALIC VERSUS BRACHIOCEPHALIC ARTERIOVENOUS (AV) FISTULA CREATION - RIGHT ARM;  Surgeon: Conrad Howard, MD;  Location: [place];  Service: Vascular;  Laterality: Right;  . AV FISTULA PLACEMENT Left 06/07/2016   Procedure: LEFT BRACHIOCEPHALIC ARTERIOVENOUS (AV) FISTULA CREATION;  Surgeon: Juanda Crumble  Antony Blackbird, MD;  Location: [place];  Service: Vascular;  Laterality: Left;  . COLONOSCOPY    . IR GENERIC HISTORICAL  08/04/2016   IR REMOVAL TUN CV  CATH W/O FL 08/04/2016 Ascencion Dike, PA-C [facility]  . IR GENERIC HISTORICAL  08/06/2016   IR FLUORO GUIDE CV LINE LEFT 08/06/2016 Arne Cleveland, MD [facility]  . IR GENERIC HISTORICAL  08/06/2016   IR US GUIDE VASC ACCESS LEFT 08/06/2016 Arne Cleveland, MD [facility]  . LIGATION OF ARTERIOVENOUS  FISTULA Left 09/15/2015   Procedure: LIGATION OF RADIOCEPHALIC ARTERIOVENOUS FISTULA - LEFT ARM;  Surgeon: Conrad [place], MD;  Location: [place];  Service: Vascular;  Laterality: Left;  . LIGATION OF ARTERIOVENOUS  FISTULA Right 04/16/2016   Procedure: LIGATION OF ARTERIOVENOUS  FISTULA;  Surgeon: Elam Dutch, MD;  Location: [place];  Service: Vascular;  Laterality: Right;  . LIGATION OF COMPETING BRANCHES OF ARTERIOVENOUS FISTULA Left 07/16/2015   Procedure: LIGATION OF SIDE BRANCHES OF ARTERIOVENOUS FISTULA LEFT ARM;  Surgeon: Rosetta Posner, MD;  Location: [place];  Service: Vascular;  Laterality: Left;  . PERIPHERAL VASCULAR CATHETERIZATION N/A 07/04/2015   Procedure: Fistulagram;  Surgeon: Elam Dutch, MD;  Location: [place] [facility];  Service: Cardiovascular;  Laterality: N/A;  . PERIPHERAL VASCULAR CATHETERIZATION N/A 09/08/2015   Procedure: Fistulagram;  Surgeon: Conrad Seymour, MD;  Location: [place] [facility];  Service: Cardiovascular;  Laterality: N/A;  . PERIPHERAL VASCULAR CATHETERIZATION Right 02/23/2016   Procedure: Fistulagram;  Surgeon: Conrad [place], MD;  Location: [place] [facility];  Service: Cardiovascular;  Laterality: Right;  . PERIPHERAL VASCULAR CATHETERIZATION N/A 04/12/2016   Procedure: Fistulagram;  Surgeon: Conrad [place], MD;  Location: [place] [facility];  Service: Cardiovascular;  Laterality: N/A;  . REVISON OF ARTERIOVENOUS FISTULA Right 03/10/2016   Procedure: RIGHT CEPHALIC VEIN TURNDOWN;  Surgeon: Conrad [place], MD;  Location: [place];  Service: Vascular;  Laterality: Right;  . TEE WITHOUT CARDIOVERSION N/A 08/06/2016   Procedure: TRANSESOPHAGEAL ECHOCARDIOGRAM (TEE);  Surgeon:  Dixie Dials, MD;  Location: [facility];  Service: Cardiovascular;  Laterality: N/A;    SOCIAL HISTORY: Social History   Social History  . Marital status: Single    Spouse name: N/A  . Number of children: N/A  . Years of education: N/A   Occupational History  . Not on file.   Social History Main Topics  . Smoking status: Former Smoker    Years: 1.00    Types: Cigarettes    Quit date: 07/08/2005  . Smokeless tobacco: Never Used  . Alcohol use No  . Drug use: No  . Sexual activity: Not on file   Other Topics Concern  . Not on file   Social History Narrative  . No narrative on file    FAMILY HISTORY Family History  Problem Relation Age of Onset  . Hypertension Other     ALLERGIES:  has No Known Allergies.  MEDICATIONS:  Current Outpatient Prescriptions  Medication Sig Dispense Refill  . calcium acetate (PHOSLO) 667 MG capsule Take 2 capsules (1,334 mg total) by mouth 3 (three) times daily with meals. 90 capsule 0  . ceFAZolin (ANCEF) 2-4 GM/100ML-% IVPB Inject 100 mLs (2 g total) into the vein Every Tuesday,Thursday,and Saturday with dialysis. 1 each 7  . Darbepoetin Alfa (ARANESP) 100 MCG/0.5ML SOSY injection Inject 0.5 mLs (100 mcg total) into the vein every Thursday with hemodialysis. Will be given at dialysis.    Marland Kitchen  gabapentin (NEURONTIN) 300 MG capsule Take 1 capsule (300 mg total) by mouth at bedtime. 90 capsule 2  . glucose blood (ONETOUCH VERIO) test strip Test three times daily 100 each 2  . insulin aspart (NOVOLOG) 100 UNIT/ML injection Inject 6 Units into the skin 3 (three) times daily before meals. 3 vial PRN  . insulin detemir (LEVEMIR) 100 UNIT/ML injection Inject 0.4 mLs (40 Units total) into the skin at bedtime. 10 mL 0  . isosorbide mononitrate (IMDUR) 60 MG 24 hr tablet Take 1 tablet (60 mg total) by mouth daily. 30 tablet 0  . Vitamin D, Ergocalciferol, (DRISDOL) 50000 units CAPS capsule Take 50,000 Units by mouth every 7 (seven) days. Monday     No  current facility-administered medications for this visit.     PHYSICAL EXAMINATION:  ECOG PERFORMANCE STATUS: 0 - Asymptomatic  Vitals reviewed.  Physical Exam  Constitutional: He is oriented to person, place, and time and well-developed, well-nourished, and in no distress. No distress.  HENT:  Head: Normocephalic and atraumatic.  Mouth/Throat: No oropharyngeal exudate.  Eyes: Pupils are equal, round, and reactive to light. Conjunctivae are normal. No scleral icterus.  Neck: Normal range of motion. Neck supple. No JVD present.  Cardiovascular: Normal rate, regular rhythm and normal heart sounds.  Exam reveals no gallop and no friction rub.   No murmur heard. Pulmonary/Chest: Breath sounds normal. No respiratory distress. He has no wheezes. He has no rales.  Abdominal: Soft. Bowel sounds are normal. He exhibits no distension. There is no tenderness. There is no guarding.  Musculoskeletal: He exhibits no edema or tenderness.  Left arm AV fistula  Lymphadenopathy:    He has no cervical adenopathy.  Neurological: He is alert and oriented to person, place, and time. No cranial nerve deficit.  Skin: Skin is warm and dry. No rash noted. No erythema. No pallor.  Psychiatric: Affect and judgment normal.     LABORATORY DATA: I have personally reviewed the data as listed:  Office Visit on 09/05/2017  Component Date Value Ref Range Status  . WBC 09/05/2017 7.4  3.4 - 10.8 x10E3/uL Final  . RBC 09/05/2017 4.13* 4.14 - 5.80 x10E6/uL Final  . Hemoglobin 09/05/2017 13.0  13.0 - 17.7 g/dL Final  . Hematocrit 09/05/2017 38.4  37.5 - 51.0 % Final  . MCV 09/05/2017 93  79 - 97 fL Final  . MCH 09/05/2017 31.5  26.6 - 33.0 pg Final  . MCHC 09/05/2017 33.9  31.5 - 35.7 g/dL Final  . RDW 09/05/2017 14.4  12.3 - 15.4 % Final  . Platelets 09/05/2017 204  150 - 379 x10E3/uL Final  . Neutrophils 09/05/2017 65  Not Estab. % Final  . Lymphs 09/05/2017 22  Not Estab. % Final  . Monocytes 09/05/2017 10   Not Estab. % Final  . Eos 09/05/2017 2  Not Estab. % Final  . Basos 09/05/2017 1  Not Estab. % Final  . Neutrophils Absolute 09/05/2017 4.8  1.4 - 7.0 x10E3/uL Final  . Lymphocytes Absolute 09/05/2017 1.6  0.7 - 3.1 x10E3/uL Final  . Monocytes Absolute 09/05/2017 0.8  0.1 - 0.9 x10E3/uL Final  . EOS (ABSOLUTE) 09/05/2017 0.2  0.0 - 0.4 x10E3/uL Final  . Basophils Absolute 09/05/2017 0.1  0.0 - 0.2 x10E3/uL Final  . Immature Granulocytes 09/05/2017 0  Not Estab. % Final  . Immature Grans (Abs) 09/05/2017 0.0  0.0 - 0.1 x10E3/uL Final  . Glucose 09/05/2017 102* 65 - 99 mg/dL Final  . BUN 09/05/2017 48*  6 - 24 mg/dL Final  . Creatinine, Ser 09/05/2017 9.92* 0.76 - 1.27 mg/dL Final                     Client Requested Flag  . GFR calc non Af Amer 09/05/2017 5* >59 mL/min/1.73 Final  . GFR calc Af Amer 09/05/2017 6* >59 mL/min/1.73 Final  . BUN/Creatinine Ratio 09/05/2017 5* 9 - 20 Final  . Sodium 09/05/2017 128* 134 - 144 mmol/L Final  . Potassium 09/05/2017 4.5  3.5 - 5.2 mmol/L Final  . Chloride 09/05/2017 88* 96 - 106 mmol/L Final  . CO2 09/05/2017 26  20 - 29 mmol/L Final  . Calcium 09/05/2017 9.3  8.7 - 10.2 mg/dL Final  . Total Protein 09/05/2017 8.8* 6.0 - 8.5 g/dL Final  . Albumin 09/05/2017 3.9  3.5 - 5.5 g/dL Final  . Globulin, Total 09/05/2017 4.9* 1.5 - 4.5 g/dL Final  . Albumin/Globulin Ratio 09/05/2017 0.8* 1.2 - 2.2 Final  . Bilirubin Total 09/05/2017 0.6  0.0 - 1.2 mg/dL Final  . Alkaline Phosphatase 09/05/2017 60  39 - 117 IU/L Final  . AST 09/05/2017 17  0 - 40 IU/L Final  . ALT 09/05/2017 8  0 - 44 IU/L Final  . Cholesterol, Total 09/05/2017 127  100 - 199 mg/dL Final  . Triglycerides 09/05/2017 90  0 - 149 mg/dL Final  . HDL 09/05/2017 41  >39 mg/dL Final  . VLDL Cholesterol Cal 09/05/2017 18  5 - 40 mg/dL Final  . LDL Calculated 09/05/2017 68  0 - 99 mg/dL Final  . Chol/HDL Ratio 09/05/2017 3.1  0.0 - 5.0 ratio Final   Comment:                                    T. Chol/HDL Ratio                                             Men  Women                               1/2 Avg.Risk  3.4    3.3                                   Avg.Risk  5.0    4.4                                2X Avg.Risk  9.6    7.1                                3X Avg.Risk 23.4   11.0   . Bayer DCA Hb A1c Waived 09/05/2017 6.2  <7.0 % Final   Comment:                                       Diabetic Adult            <7.0  Healthy Adult        4.3 - 5.7                                                           (DCCT/NGSP) American Diabetes Association's Summary of Glycemic Recommendations for Adults with Diabetes: Hemoglobin A1c <7.0%. More stringent glycemic goals (A1c <6.0%) may further reduce complications at the cost of increased risk of hypoglycemia.     RADIOGRAPHIC STUDIES: I have personally reviewed the radiological images as listed and agree with the findings in the report  PATHOLOGY: Diagnosis Bone Marrow, Aspirate,Biopsy, and Clot, right iliac - NORMOCELLULAR BONE MARROW FOR AGE WITH PLASMA CELL NEOPLASM. - TRILINEAGE HEMATOPOIESIS. - SEE COMMENT. PERIPHERAL BLOOD: - NORMOCYTIC-NORMOCHROMIC ANEMIA Diagnosis Note The bone marrow is generally normocellular for age with trilineage hematopoiesis and nonspecific myeloid changes. However, the plasma cells are increased in number representing 11% of all cells associated with atypical cytomorphologic features. Immunohistochemical stains show that the plasma cells are kappa light chain restricted consistent with plasma cell neoplasm. Correlation with cytogenetic studies is recommended. (BNS:kh/ds 04-03-15) Susanne Greenhouse MD Pathologist, Electronic Signature (Case signed 04/04/2015)   Diagnosis Kidney, biopsy w/ EM l, left cortex COLLAPSING VARIANT OF FOCAL AND SEGMENTAL GLOMERULOSCLEROSIS. DIABETIC GLOMERULOSCLEROSIS. MODERATE ARTERIONEPHROSLCEROSIS. NO EVIDENCE OF A MONOCLONAL  IMMUNOGLOBULIN DEPOSITION DISEASE Diagnosis Note Diagnostic evaluation conducted by [facility]. Candiss Norse, MD @ [facility]; [id number] Claudette Laws MD Pathologist, Electronic Signature (Case signed 04/10/2015)  ASSESSMENT/PLAN 61 y/o with Anemia of chronic disease and ESRD on HD presents for evaluation of smoldering myeloma.  Reviewed patient's myeloma labs in detail with him today. His M spike is stable and he does not have progressive worsening of CRAB (hypercalcemia, renal failure, anemia, bone pain) symptoms. Continue surveillance at this time. RTC in 4 months for follow up with labs. Patient is very noncompliant with follow up; counseled him on the importance of follow up today.  Orders Placed This Encounter  Procedures  . CBC with Differential    Standing Status:   Future    Standing Expiration Date:   09/13/2018  . Comprehensive metabolic panel    Standing Status:   Future    Standing Expiration Date:   09/13/2018  . Kappa/lambda light chains    Standing Status:   Future    Standing Expiration Date:   09/13/2018  . Multiple Myeloma Panel (SPEP&IFE w/QIG)    Standing Status:   Future    Standing Expiration Date:   09/13/2018    All questions were answered. The patient knows to call the clinic with any problems, questions or concerns.  This note was electronically signed.    Twana First, MD  09/13/2017 2:11 PM

## 2017-09-15 DIAGNOSIS — E118 Type 2 diabetes mellitus with unspecified complications: Secondary | ICD-10-CM | POA: Diagnosis not present

## 2017-09-15 DIAGNOSIS — N2581 Secondary hyperparathyroidism of renal origin: Secondary | ICD-10-CM | POA: Diagnosis not present

## 2017-09-15 DIAGNOSIS — N186 End stage renal disease: Secondary | ICD-10-CM | POA: Diagnosis not present

## 2017-09-18 DIAGNOSIS — E118 Type 2 diabetes mellitus with unspecified complications: Secondary | ICD-10-CM | POA: Diagnosis not present

## 2017-09-18 DIAGNOSIS — N2581 Secondary hyperparathyroidism of renal origin: Secondary | ICD-10-CM | POA: Diagnosis not present

## 2017-09-18 DIAGNOSIS — N186 End stage renal disease: Secondary | ICD-10-CM | POA: Diagnosis not present

## 2017-09-20 DIAGNOSIS — N186 End stage renal disease: Secondary | ICD-10-CM | POA: Diagnosis not present

## 2017-09-20 DIAGNOSIS — N2581 Secondary hyperparathyroidism of renal origin: Secondary | ICD-10-CM | POA: Diagnosis not present

## 2017-09-20 DIAGNOSIS — E118 Type 2 diabetes mellitus with unspecified complications: Secondary | ICD-10-CM | POA: Diagnosis not present

## 2017-09-22 DIAGNOSIS — E118 Type 2 diabetes mellitus with unspecified complications: Secondary | ICD-10-CM | POA: Diagnosis not present

## 2017-09-22 DIAGNOSIS — N186 End stage renal disease: Secondary | ICD-10-CM | POA: Diagnosis not present

## 2017-09-22 DIAGNOSIS — N2581 Secondary hyperparathyroidism of renal origin: Secondary | ICD-10-CM | POA: Diagnosis not present

## 2017-09-24 DIAGNOSIS — E118 Type 2 diabetes mellitus with unspecified complications: Secondary | ICD-10-CM | POA: Diagnosis not present

## 2017-09-24 DIAGNOSIS — N186 End stage renal disease: Secondary | ICD-10-CM | POA: Diagnosis not present

## 2017-09-24 DIAGNOSIS — N2581 Secondary hyperparathyroidism of renal origin: Secondary | ICD-10-CM | POA: Diagnosis not present

## 2017-09-27 DIAGNOSIS — N2581 Secondary hyperparathyroidism of renal origin: Secondary | ICD-10-CM | POA: Diagnosis not present

## 2017-09-27 DIAGNOSIS — E118 Type 2 diabetes mellitus with unspecified complications: Secondary | ICD-10-CM | POA: Diagnosis not present

## 2017-09-27 DIAGNOSIS — N186 End stage renal disease: Secondary | ICD-10-CM | POA: Diagnosis not present

## 2017-09-28 DIAGNOSIS — H4311 Vitreous hemorrhage, right eye: Secondary | ICD-10-CM | POA: Diagnosis not present

## 2017-09-28 DIAGNOSIS — E113513 Type 2 diabetes mellitus with proliferative diabetic retinopathy with macular edema, bilateral: Secondary | ICD-10-CM | POA: Diagnosis not present

## 2017-09-28 DIAGNOSIS — H35372 Puckering of macula, left eye: Secondary | ICD-10-CM | POA: Diagnosis not present

## 2017-09-28 DIAGNOSIS — H3582 Retinal ischemia: Secondary | ICD-10-CM | POA: Diagnosis not present

## 2017-09-29 DIAGNOSIS — N2581 Secondary hyperparathyroidism of renal origin: Secondary | ICD-10-CM | POA: Diagnosis not present

## 2017-09-29 DIAGNOSIS — N186 End stage renal disease: Secondary | ICD-10-CM | POA: Diagnosis not present

## 2017-09-29 DIAGNOSIS — E118 Type 2 diabetes mellitus with unspecified complications: Secondary | ICD-10-CM | POA: Diagnosis not present

## 2017-10-01 DIAGNOSIS — N186 End stage renal disease: Secondary | ICD-10-CM | POA: Diagnosis not present

## 2017-10-01 DIAGNOSIS — E118 Type 2 diabetes mellitus with unspecified complications: Secondary | ICD-10-CM | POA: Diagnosis not present

## 2017-10-01 DIAGNOSIS — N2581 Secondary hyperparathyroidism of renal origin: Secondary | ICD-10-CM | POA: Diagnosis not present

## 2017-10-04 DIAGNOSIS — N186 End stage renal disease: Secondary | ICD-10-CM | POA: Diagnosis not present

## 2017-10-04 DIAGNOSIS — E118 Type 2 diabetes mellitus with unspecified complications: Secondary | ICD-10-CM | POA: Diagnosis not present

## 2017-10-04 DIAGNOSIS — N2581 Secondary hyperparathyroidism of renal origin: Secondary | ICD-10-CM | POA: Diagnosis not present

## 2017-10-05 DIAGNOSIS — N041 Nephrotic syndrome with focal and segmental glomerular lesions: Secondary | ICD-10-CM | POA: Diagnosis not present

## 2017-10-05 DIAGNOSIS — Z992 Dependence on renal dialysis: Secondary | ICD-10-CM | POA: Diagnosis not present

## 2017-10-05 DIAGNOSIS — N186 End stage renal disease: Secondary | ICD-10-CM | POA: Diagnosis not present

## 2017-10-06 DIAGNOSIS — N2581 Secondary hyperparathyroidism of renal origin: Secondary | ICD-10-CM | POA: Diagnosis not present

## 2017-10-06 DIAGNOSIS — N186 End stage renal disease: Secondary | ICD-10-CM | POA: Diagnosis not present

## 2017-10-06 DIAGNOSIS — E118 Type 2 diabetes mellitus with unspecified complications: Secondary | ICD-10-CM | POA: Diagnosis not present

## 2017-10-08 DIAGNOSIS — N186 End stage renal disease: Secondary | ICD-10-CM | POA: Diagnosis not present

## 2017-10-08 DIAGNOSIS — E118 Type 2 diabetes mellitus with unspecified complications: Secondary | ICD-10-CM | POA: Diagnosis not present

## 2017-10-08 DIAGNOSIS — N2581 Secondary hyperparathyroidism of renal origin: Secondary | ICD-10-CM | POA: Diagnosis not present

## 2017-10-10 DIAGNOSIS — H2513 Age-related nuclear cataract, bilateral: Secondary | ICD-10-CM | POA: Diagnosis not present

## 2017-10-10 DIAGNOSIS — H2511 Age-related nuclear cataract, right eye: Secondary | ICD-10-CM | POA: Diagnosis not present

## 2017-10-11 DIAGNOSIS — N186 End stage renal disease: Secondary | ICD-10-CM | POA: Diagnosis not present

## 2017-10-11 DIAGNOSIS — E118 Type 2 diabetes mellitus with unspecified complications: Secondary | ICD-10-CM | POA: Diagnosis not present

## 2017-10-11 DIAGNOSIS — N2581 Secondary hyperparathyroidism of renal origin: Secondary | ICD-10-CM | POA: Diagnosis not present

## 2017-10-13 DIAGNOSIS — N186 End stage renal disease: Secondary | ICD-10-CM | POA: Diagnosis not present

## 2017-10-13 DIAGNOSIS — E118 Type 2 diabetes mellitus with unspecified complications: Secondary | ICD-10-CM | POA: Diagnosis not present

## 2017-10-13 DIAGNOSIS — N2581 Secondary hyperparathyroidism of renal origin: Secondary | ICD-10-CM | POA: Diagnosis not present

## 2017-10-15 DIAGNOSIS — N2581 Secondary hyperparathyroidism of renal origin: Secondary | ICD-10-CM | POA: Diagnosis not present

## 2017-10-15 DIAGNOSIS — E118 Type 2 diabetes mellitus with unspecified complications: Secondary | ICD-10-CM | POA: Diagnosis not present

## 2017-10-15 DIAGNOSIS — N186 End stage renal disease: Secondary | ICD-10-CM | POA: Diagnosis not present

## 2017-10-18 DIAGNOSIS — E118 Type 2 diabetes mellitus with unspecified complications: Secondary | ICD-10-CM | POA: Diagnosis not present

## 2017-10-18 DIAGNOSIS — N2581 Secondary hyperparathyroidism of renal origin: Secondary | ICD-10-CM | POA: Diagnosis not present

## 2017-10-18 DIAGNOSIS — N186 End stage renal disease: Secondary | ICD-10-CM | POA: Diagnosis not present

## 2017-10-20 DIAGNOSIS — N2581 Secondary hyperparathyroidism of renal origin: Secondary | ICD-10-CM | POA: Diagnosis not present

## 2017-10-20 DIAGNOSIS — N186 End stage renal disease: Secondary | ICD-10-CM | POA: Diagnosis not present

## 2017-10-20 DIAGNOSIS — E118 Type 2 diabetes mellitus with unspecified complications: Secondary | ICD-10-CM | POA: Diagnosis not present

## 2017-10-22 DIAGNOSIS — N2581 Secondary hyperparathyroidism of renal origin: Secondary | ICD-10-CM | POA: Diagnosis not present

## 2017-10-22 DIAGNOSIS — N186 End stage renal disease: Secondary | ICD-10-CM | POA: Diagnosis not present

## 2017-10-22 DIAGNOSIS — E118 Type 2 diabetes mellitus with unspecified complications: Secondary | ICD-10-CM | POA: Diagnosis not present

## 2017-10-24 DIAGNOSIS — E118 Type 2 diabetes mellitus with unspecified complications: Secondary | ICD-10-CM | POA: Diagnosis not present

## 2017-10-24 DIAGNOSIS — N186 End stage renal disease: Secondary | ICD-10-CM | POA: Diagnosis not present

## 2017-10-24 DIAGNOSIS — N2581 Secondary hyperparathyroidism of renal origin: Secondary | ICD-10-CM | POA: Diagnosis not present

## 2017-10-26 DIAGNOSIS — N2581 Secondary hyperparathyroidism of renal origin: Secondary | ICD-10-CM | POA: Diagnosis not present

## 2017-10-26 DIAGNOSIS — N186 End stage renal disease: Secondary | ICD-10-CM | POA: Diagnosis not present

## 2017-10-26 DIAGNOSIS — E118 Type 2 diabetes mellitus with unspecified complications: Secondary | ICD-10-CM | POA: Diagnosis not present

## 2017-10-29 DIAGNOSIS — N2581 Secondary hyperparathyroidism of renal origin: Secondary | ICD-10-CM | POA: Diagnosis not present

## 2017-10-29 DIAGNOSIS — E118 Type 2 diabetes mellitus with unspecified complications: Secondary | ICD-10-CM | POA: Diagnosis not present

## 2017-10-29 DIAGNOSIS — N186 End stage renal disease: Secondary | ICD-10-CM | POA: Diagnosis not present

## 2017-11-01 DIAGNOSIS — E118 Type 2 diabetes mellitus with unspecified complications: Secondary | ICD-10-CM | POA: Diagnosis not present

## 2017-11-01 DIAGNOSIS — N186 End stage renal disease: Secondary | ICD-10-CM | POA: Diagnosis not present

## 2017-11-01 DIAGNOSIS — N2581 Secondary hyperparathyroidism of renal origin: Secondary | ICD-10-CM | POA: Diagnosis not present

## 2017-11-03 DIAGNOSIS — N186 End stage renal disease: Secondary | ICD-10-CM | POA: Diagnosis not present

## 2017-11-03 DIAGNOSIS — N2581 Secondary hyperparathyroidism of renal origin: Secondary | ICD-10-CM | POA: Diagnosis not present

## 2017-11-03 DIAGNOSIS — E118 Type 2 diabetes mellitus with unspecified complications: Secondary | ICD-10-CM | POA: Diagnosis not present

## 2017-11-04 DIAGNOSIS — N186 End stage renal disease: Secondary | ICD-10-CM | POA: Diagnosis not present

## 2017-11-04 DIAGNOSIS — Z992 Dependence on renal dialysis: Secondary | ICD-10-CM | POA: Diagnosis not present

## 2017-11-04 DIAGNOSIS — N041 Nephrotic syndrome with focal and segmental glomerular lesions: Secondary | ICD-10-CM | POA: Diagnosis not present

## 2017-11-05 DIAGNOSIS — N186 End stage renal disease: Secondary | ICD-10-CM | POA: Diagnosis not present

## 2017-11-05 DIAGNOSIS — N2581 Secondary hyperparathyroidism of renal origin: Secondary | ICD-10-CM | POA: Diagnosis not present

## 2017-11-05 DIAGNOSIS — E118 Type 2 diabetes mellitus with unspecified complications: Secondary | ICD-10-CM | POA: Diagnosis not present

## 2017-11-08 DIAGNOSIS — N2581 Secondary hyperparathyroidism of renal origin: Secondary | ICD-10-CM | POA: Diagnosis not present

## 2017-11-08 DIAGNOSIS — E118 Type 2 diabetes mellitus with unspecified complications: Secondary | ICD-10-CM | POA: Diagnosis not present

## 2017-11-08 DIAGNOSIS — N186 End stage renal disease: Secondary | ICD-10-CM | POA: Diagnosis not present

## 2017-11-10 DIAGNOSIS — N2581 Secondary hyperparathyroidism of renal origin: Secondary | ICD-10-CM | POA: Diagnosis not present

## 2017-11-10 DIAGNOSIS — N186 End stage renal disease: Secondary | ICD-10-CM | POA: Diagnosis not present

## 2017-11-10 DIAGNOSIS — E118 Type 2 diabetes mellitus with unspecified complications: Secondary | ICD-10-CM | POA: Diagnosis not present

## 2017-11-15 DIAGNOSIS — E118 Type 2 diabetes mellitus with unspecified complications: Secondary | ICD-10-CM | POA: Diagnosis not present

## 2017-11-15 DIAGNOSIS — N186 End stage renal disease: Secondary | ICD-10-CM | POA: Diagnosis not present

## 2017-11-15 DIAGNOSIS — N2581 Secondary hyperparathyroidism of renal origin: Secondary | ICD-10-CM | POA: Diagnosis not present

## 2017-11-17 DIAGNOSIS — E118 Type 2 diabetes mellitus with unspecified complications: Secondary | ICD-10-CM | POA: Diagnosis not present

## 2017-11-17 DIAGNOSIS — N2581 Secondary hyperparathyroidism of renal origin: Secondary | ICD-10-CM | POA: Diagnosis not present

## 2017-11-17 DIAGNOSIS — N186 End stage renal disease: Secondary | ICD-10-CM | POA: Diagnosis not present

## 2017-11-19 DIAGNOSIS — E118 Type 2 diabetes mellitus with unspecified complications: Secondary | ICD-10-CM | POA: Diagnosis not present

## 2017-11-19 DIAGNOSIS — N186 End stage renal disease: Secondary | ICD-10-CM | POA: Diagnosis not present

## 2017-11-19 DIAGNOSIS — N2581 Secondary hyperparathyroidism of renal origin: Secondary | ICD-10-CM | POA: Diagnosis not present

## 2017-11-22 DIAGNOSIS — E118 Type 2 diabetes mellitus with unspecified complications: Secondary | ICD-10-CM | POA: Diagnosis not present

## 2017-11-22 DIAGNOSIS — N186 End stage renal disease: Secondary | ICD-10-CM | POA: Diagnosis not present

## 2017-11-22 DIAGNOSIS — N2581 Secondary hyperparathyroidism of renal origin: Secondary | ICD-10-CM | POA: Diagnosis not present

## 2017-11-24 DIAGNOSIS — E118 Type 2 diabetes mellitus with unspecified complications: Secondary | ICD-10-CM | POA: Diagnosis not present

## 2017-11-24 DIAGNOSIS — N186 End stage renal disease: Secondary | ICD-10-CM | POA: Diagnosis not present

## 2017-11-24 DIAGNOSIS — N2581 Secondary hyperparathyroidism of renal origin: Secondary | ICD-10-CM | POA: Diagnosis not present

## 2017-11-26 DIAGNOSIS — N186 End stage renal disease: Secondary | ICD-10-CM | POA: Diagnosis not present

## 2017-11-26 DIAGNOSIS — N2581 Secondary hyperparathyroidism of renal origin: Secondary | ICD-10-CM | POA: Diagnosis not present

## 2017-11-26 DIAGNOSIS — E118 Type 2 diabetes mellitus with unspecified complications: Secondary | ICD-10-CM | POA: Diagnosis not present

## 2017-11-28 DIAGNOSIS — N186 End stage renal disease: Secondary | ICD-10-CM | POA: Diagnosis not present

## 2017-11-28 DIAGNOSIS — E118 Type 2 diabetes mellitus with unspecified complications: Secondary | ICD-10-CM | POA: Diagnosis not present

## 2017-11-28 DIAGNOSIS — N2581 Secondary hyperparathyroidism of renal origin: Secondary | ICD-10-CM | POA: Diagnosis not present

## 2017-12-01 DIAGNOSIS — E118 Type 2 diabetes mellitus with unspecified complications: Secondary | ICD-10-CM | POA: Diagnosis not present

## 2017-12-01 DIAGNOSIS — N2581 Secondary hyperparathyroidism of renal origin: Secondary | ICD-10-CM | POA: Diagnosis not present

## 2017-12-01 DIAGNOSIS — N186 End stage renal disease: Secondary | ICD-10-CM | POA: Diagnosis not present

## 2017-12-02 DIAGNOSIS — H2511 Age-related nuclear cataract, right eye: Secondary | ICD-10-CM | POA: Diagnosis not present

## 2017-12-02 DIAGNOSIS — H2512 Age-related nuclear cataract, left eye: Secondary | ICD-10-CM | POA: Diagnosis not present

## 2017-12-02 DIAGNOSIS — Z961 Presence of intraocular lens: Secondary | ICD-10-CM | POA: Diagnosis not present

## 2017-12-03 DIAGNOSIS — E118 Type 2 diabetes mellitus with unspecified complications: Secondary | ICD-10-CM | POA: Diagnosis not present

## 2017-12-03 DIAGNOSIS — N2581 Secondary hyperparathyroidism of renal origin: Secondary | ICD-10-CM | POA: Diagnosis not present

## 2017-12-03 DIAGNOSIS — N186 End stage renal disease: Secondary | ICD-10-CM | POA: Diagnosis not present

## 2017-12-05 DIAGNOSIS — E118 Type 2 diabetes mellitus with unspecified complications: Secondary | ICD-10-CM | POA: Diagnosis not present

## 2017-12-05 DIAGNOSIS — Z992 Dependence on renal dialysis: Secondary | ICD-10-CM | POA: Diagnosis not present

## 2017-12-05 DIAGNOSIS — N186 End stage renal disease: Secondary | ICD-10-CM | POA: Diagnosis not present

## 2017-12-05 DIAGNOSIS — N041 Nephrotic syndrome with focal and segmental glomerular lesions: Secondary | ICD-10-CM | POA: Diagnosis not present

## 2017-12-05 DIAGNOSIS — N2581 Secondary hyperparathyroidism of renal origin: Secondary | ICD-10-CM | POA: Diagnosis not present

## 2017-12-07 ENCOUNTER — Ambulatory Visit: Payer: Medicare Other | Admitting: Family Medicine

## 2017-12-08 DIAGNOSIS — E118 Type 2 diabetes mellitus with unspecified complications: Secondary | ICD-10-CM | POA: Diagnosis not present

## 2017-12-08 DIAGNOSIS — N186 End stage renal disease: Secondary | ICD-10-CM | POA: Diagnosis not present

## 2017-12-08 DIAGNOSIS — N2581 Secondary hyperparathyroidism of renal origin: Secondary | ICD-10-CM | POA: Diagnosis not present

## 2017-12-10 DIAGNOSIS — E118 Type 2 diabetes mellitus with unspecified complications: Secondary | ICD-10-CM | POA: Diagnosis not present

## 2017-12-10 DIAGNOSIS — N186 End stage renal disease: Secondary | ICD-10-CM | POA: Diagnosis not present

## 2017-12-10 DIAGNOSIS — N2581 Secondary hyperparathyroidism of renal origin: Secondary | ICD-10-CM | POA: Diagnosis not present

## 2017-12-13 DIAGNOSIS — N2581 Secondary hyperparathyroidism of renal origin: Secondary | ICD-10-CM | POA: Diagnosis not present

## 2017-12-13 DIAGNOSIS — N186 End stage renal disease: Secondary | ICD-10-CM | POA: Diagnosis not present

## 2017-12-13 DIAGNOSIS — E118 Type 2 diabetes mellitus with unspecified complications: Secondary | ICD-10-CM | POA: Diagnosis not present

## 2017-12-14 ENCOUNTER — Encounter: Payer: Self-pay | Admitting: Family Medicine

## 2017-12-15 DIAGNOSIS — E118 Type 2 diabetes mellitus with unspecified complications: Secondary | ICD-10-CM | POA: Diagnosis not present

## 2017-12-15 DIAGNOSIS — N2581 Secondary hyperparathyroidism of renal origin: Secondary | ICD-10-CM | POA: Diagnosis not present

## 2017-12-15 DIAGNOSIS — N186 End stage renal disease: Secondary | ICD-10-CM | POA: Diagnosis not present

## 2017-12-17 DIAGNOSIS — N2581 Secondary hyperparathyroidism of renal origin: Secondary | ICD-10-CM | POA: Diagnosis not present

## 2017-12-17 DIAGNOSIS — N186 End stage renal disease: Secondary | ICD-10-CM | POA: Diagnosis not present

## 2017-12-17 DIAGNOSIS — E118 Type 2 diabetes mellitus with unspecified complications: Secondary | ICD-10-CM | POA: Diagnosis not present

## 2017-12-20 DIAGNOSIS — E118 Type 2 diabetes mellitus with unspecified complications: Secondary | ICD-10-CM | POA: Diagnosis not present

## 2017-12-20 DIAGNOSIS — N2581 Secondary hyperparathyroidism of renal origin: Secondary | ICD-10-CM | POA: Diagnosis not present

## 2017-12-20 DIAGNOSIS — N186 End stage renal disease: Secondary | ICD-10-CM | POA: Diagnosis not present

## 2017-12-22 DIAGNOSIS — H2512 Age-related nuclear cataract, left eye: Secondary | ICD-10-CM | POA: Diagnosis not present

## 2017-12-23 DIAGNOSIS — N186 End stage renal disease: Secondary | ICD-10-CM | POA: Diagnosis not present

## 2017-12-23 DIAGNOSIS — E118 Type 2 diabetes mellitus with unspecified complications: Secondary | ICD-10-CM | POA: Diagnosis not present

## 2017-12-23 DIAGNOSIS — N2581 Secondary hyperparathyroidism of renal origin: Secondary | ICD-10-CM | POA: Diagnosis not present

## 2017-12-26 ENCOUNTER — Ambulatory Visit: Payer: Medicare Other | Admitting: Family Medicine

## 2017-12-27 ENCOUNTER — Encounter: Payer: Self-pay | Admitting: Family Medicine

## 2017-12-27 DIAGNOSIS — N186 End stage renal disease: Secondary | ICD-10-CM | POA: Diagnosis not present

## 2017-12-27 DIAGNOSIS — E118 Type 2 diabetes mellitus with unspecified complications: Secondary | ICD-10-CM | POA: Diagnosis not present

## 2017-12-27 DIAGNOSIS — N2581 Secondary hyperparathyroidism of renal origin: Secondary | ICD-10-CM | POA: Diagnosis not present

## 2017-12-29 DIAGNOSIS — N186 End stage renal disease: Secondary | ICD-10-CM | POA: Diagnosis not present

## 2017-12-29 DIAGNOSIS — E118 Type 2 diabetes mellitus with unspecified complications: Secondary | ICD-10-CM | POA: Diagnosis not present

## 2017-12-29 DIAGNOSIS — N2581 Secondary hyperparathyroidism of renal origin: Secondary | ICD-10-CM | POA: Diagnosis not present

## 2017-12-31 DIAGNOSIS — N186 End stage renal disease: Secondary | ICD-10-CM | POA: Diagnosis not present

## 2017-12-31 DIAGNOSIS — N2581 Secondary hyperparathyroidism of renal origin: Secondary | ICD-10-CM | POA: Diagnosis not present

## 2017-12-31 DIAGNOSIS — E118 Type 2 diabetes mellitus with unspecified complications: Secondary | ICD-10-CM | POA: Diagnosis not present

## 2018-01-03 DIAGNOSIS — N2581 Secondary hyperparathyroidism of renal origin: Secondary | ICD-10-CM | POA: Diagnosis not present

## 2018-01-03 DIAGNOSIS — E118 Type 2 diabetes mellitus with unspecified complications: Secondary | ICD-10-CM | POA: Diagnosis not present

## 2018-01-03 DIAGNOSIS — N186 End stage renal disease: Secondary | ICD-10-CM | POA: Diagnosis not present

## 2018-01-05 DIAGNOSIS — N2581 Secondary hyperparathyroidism of renal origin: Secondary | ICD-10-CM | POA: Diagnosis not present

## 2018-01-05 DIAGNOSIS — N186 End stage renal disease: Secondary | ICD-10-CM | POA: Diagnosis not present

## 2018-01-05 DIAGNOSIS — E118 Type 2 diabetes mellitus with unspecified complications: Secondary | ICD-10-CM | POA: Diagnosis not present

## 2018-01-05 DIAGNOSIS — N041 Nephrotic syndrome with focal and segmental glomerular lesions: Secondary | ICD-10-CM | POA: Diagnosis not present

## 2018-01-05 DIAGNOSIS — Z992 Dependence on renal dialysis: Secondary | ICD-10-CM | POA: Diagnosis not present

## 2018-01-06 DIAGNOSIS — Z992 Dependence on renal dialysis: Secondary | ICD-10-CM | POA: Diagnosis not present

## 2018-01-06 DIAGNOSIS — N041 Nephrotic syndrome with focal and segmental glomerular lesions: Secondary | ICD-10-CM | POA: Diagnosis not present

## 2018-01-06 DIAGNOSIS — N186 End stage renal disease: Secondary | ICD-10-CM | POA: Diagnosis not present

## 2018-01-07 DIAGNOSIS — N186 End stage renal disease: Secondary | ICD-10-CM | POA: Diagnosis not present

## 2018-01-07 DIAGNOSIS — N2581 Secondary hyperparathyroidism of renal origin: Secondary | ICD-10-CM | POA: Diagnosis not present

## 2018-01-10 DIAGNOSIS — N186 End stage renal disease: Secondary | ICD-10-CM | POA: Diagnosis not present

## 2018-01-10 DIAGNOSIS — N2581 Secondary hyperparathyroidism of renal origin: Secondary | ICD-10-CM | POA: Diagnosis not present

## 2018-01-12 DIAGNOSIS — N2581 Secondary hyperparathyroidism of renal origin: Secondary | ICD-10-CM | POA: Diagnosis not present

## 2018-01-12 DIAGNOSIS — N186 End stage renal disease: Secondary | ICD-10-CM | POA: Diagnosis not present

## 2018-01-14 DIAGNOSIS — N186 End stage renal disease: Secondary | ICD-10-CM | POA: Diagnosis not present

## 2018-01-14 DIAGNOSIS — N2581 Secondary hyperparathyroidism of renal origin: Secondary | ICD-10-CM | POA: Diagnosis not present

## 2018-01-16 ENCOUNTER — Ambulatory Visit (HOSPITAL_COMMUNITY): Payer: Medicare Other | Admitting: Internal Medicine

## 2018-01-16 ENCOUNTER — Other Ambulatory Visit (HOSPITAL_COMMUNITY): Payer: Medicare Other

## 2018-01-17 DIAGNOSIS — N2581 Secondary hyperparathyroidism of renal origin: Secondary | ICD-10-CM | POA: Diagnosis not present

## 2018-01-17 DIAGNOSIS — N186 End stage renal disease: Secondary | ICD-10-CM | POA: Diagnosis not present

## 2018-01-19 DIAGNOSIS — N2581 Secondary hyperparathyroidism of renal origin: Secondary | ICD-10-CM | POA: Diagnosis not present

## 2018-01-19 DIAGNOSIS — N186 End stage renal disease: Secondary | ICD-10-CM | POA: Diagnosis not present

## 2018-01-21 DIAGNOSIS — N2581 Secondary hyperparathyroidism of renal origin: Secondary | ICD-10-CM | POA: Diagnosis not present

## 2018-01-21 DIAGNOSIS — N186 End stage renal disease: Secondary | ICD-10-CM | POA: Diagnosis not present

## 2018-01-24 ENCOUNTER — Telehealth: Payer: Self-pay | Admitting: Family Medicine

## 2018-01-24 DIAGNOSIS — N186 End stage renal disease: Secondary | ICD-10-CM | POA: Diagnosis not present

## 2018-01-24 DIAGNOSIS — N2581 Secondary hyperparathyroidism of renal origin: Secondary | ICD-10-CM | POA: Diagnosis not present

## 2018-01-26 DIAGNOSIS — N2581 Secondary hyperparathyroidism of renal origin: Secondary | ICD-10-CM | POA: Diagnosis not present

## 2018-01-26 DIAGNOSIS — N186 End stage renal disease: Secondary | ICD-10-CM | POA: Diagnosis not present

## 2018-01-28 DIAGNOSIS — N186 End stage renal disease: Secondary | ICD-10-CM | POA: Diagnosis not present

## 2018-01-28 DIAGNOSIS — N2581 Secondary hyperparathyroidism of renal origin: Secondary | ICD-10-CM | POA: Diagnosis not present

## 2018-01-31 DIAGNOSIS — N2581 Secondary hyperparathyroidism of renal origin: Secondary | ICD-10-CM | POA: Diagnosis not present

## 2018-01-31 DIAGNOSIS — N186 End stage renal disease: Secondary | ICD-10-CM | POA: Diagnosis not present

## 2018-02-02 DIAGNOSIS — N2581 Secondary hyperparathyroidism of renal origin: Secondary | ICD-10-CM | POA: Diagnosis not present

## 2018-02-02 DIAGNOSIS — N186 End stage renal disease: Secondary | ICD-10-CM | POA: Diagnosis not present

## 2018-02-03 DIAGNOSIS — N186 End stage renal disease: Secondary | ICD-10-CM | POA: Diagnosis not present

## 2018-02-03 DIAGNOSIS — N041 Nephrotic syndrome with focal and segmental glomerular lesions: Secondary | ICD-10-CM | POA: Diagnosis not present

## 2018-02-03 DIAGNOSIS — Z992 Dependence on renal dialysis: Secondary | ICD-10-CM | POA: Diagnosis not present

## 2018-02-04 DIAGNOSIS — E118 Type 2 diabetes mellitus with unspecified complications: Secondary | ICD-10-CM | POA: Diagnosis not present

## 2018-02-04 DIAGNOSIS — N186 End stage renal disease: Secondary | ICD-10-CM | POA: Diagnosis not present

## 2018-02-04 DIAGNOSIS — N2581 Secondary hyperparathyroidism of renal origin: Secondary | ICD-10-CM | POA: Diagnosis not present

## 2018-02-07 DIAGNOSIS — N186 End stage renal disease: Secondary | ICD-10-CM | POA: Diagnosis not present

## 2018-02-07 DIAGNOSIS — N2581 Secondary hyperparathyroidism of renal origin: Secondary | ICD-10-CM | POA: Diagnosis not present

## 2018-02-07 DIAGNOSIS — E118 Type 2 diabetes mellitus with unspecified complications: Secondary | ICD-10-CM | POA: Diagnosis not present

## 2018-02-09 DIAGNOSIS — N186 End stage renal disease: Secondary | ICD-10-CM | POA: Diagnosis not present

## 2018-02-09 DIAGNOSIS — N2581 Secondary hyperparathyroidism of renal origin: Secondary | ICD-10-CM | POA: Diagnosis not present

## 2018-02-09 DIAGNOSIS — E118 Type 2 diabetes mellitus with unspecified complications: Secondary | ICD-10-CM | POA: Diagnosis not present

## 2018-02-11 DIAGNOSIS — N186 End stage renal disease: Secondary | ICD-10-CM | POA: Diagnosis not present

## 2018-02-11 DIAGNOSIS — E118 Type 2 diabetes mellitus with unspecified complications: Secondary | ICD-10-CM | POA: Diagnosis not present

## 2018-02-11 DIAGNOSIS — N2581 Secondary hyperparathyroidism of renal origin: Secondary | ICD-10-CM | POA: Diagnosis not present

## 2018-02-14 DIAGNOSIS — E118 Type 2 diabetes mellitus with unspecified complications: Secondary | ICD-10-CM | POA: Diagnosis not present

## 2018-02-14 DIAGNOSIS — N186 End stage renal disease: Secondary | ICD-10-CM | POA: Diagnosis not present

## 2018-02-14 DIAGNOSIS — N2581 Secondary hyperparathyroidism of renal origin: Secondary | ICD-10-CM | POA: Diagnosis not present

## 2018-02-15 ENCOUNTER — Ambulatory Visit: Payer: Medicare Other | Admitting: Family Medicine

## 2018-02-16 ENCOUNTER — Encounter: Payer: Self-pay | Admitting: Family Medicine

## 2018-02-16 DIAGNOSIS — E118 Type 2 diabetes mellitus with unspecified complications: Secondary | ICD-10-CM | POA: Diagnosis not present

## 2018-02-16 DIAGNOSIS — N186 End stage renal disease: Secondary | ICD-10-CM | POA: Diagnosis not present

## 2018-02-16 DIAGNOSIS — N2581 Secondary hyperparathyroidism of renal origin: Secondary | ICD-10-CM | POA: Diagnosis not present

## 2018-02-18 DIAGNOSIS — N2581 Secondary hyperparathyroidism of renal origin: Secondary | ICD-10-CM | POA: Diagnosis not present

## 2018-02-18 DIAGNOSIS — N186 End stage renal disease: Secondary | ICD-10-CM | POA: Diagnosis not present

## 2018-02-18 DIAGNOSIS — E118 Type 2 diabetes mellitus with unspecified complications: Secondary | ICD-10-CM | POA: Diagnosis not present

## 2018-02-21 DIAGNOSIS — N186 End stage renal disease: Secondary | ICD-10-CM | POA: Diagnosis not present

## 2018-02-21 DIAGNOSIS — N2581 Secondary hyperparathyroidism of renal origin: Secondary | ICD-10-CM | POA: Diagnosis not present

## 2018-02-21 DIAGNOSIS — E118 Type 2 diabetes mellitus with unspecified complications: Secondary | ICD-10-CM | POA: Diagnosis not present

## 2018-02-23 DIAGNOSIS — N186 End stage renal disease: Secondary | ICD-10-CM | POA: Diagnosis not present

## 2018-02-23 DIAGNOSIS — E118 Type 2 diabetes mellitus with unspecified complications: Secondary | ICD-10-CM | POA: Diagnosis not present

## 2018-02-23 DIAGNOSIS — N2581 Secondary hyperparathyroidism of renal origin: Secondary | ICD-10-CM | POA: Diagnosis not present

## 2018-02-25 DIAGNOSIS — N186 End stage renal disease: Secondary | ICD-10-CM | POA: Diagnosis not present

## 2018-02-25 DIAGNOSIS — N2581 Secondary hyperparathyroidism of renal origin: Secondary | ICD-10-CM | POA: Diagnosis not present

## 2018-02-25 DIAGNOSIS — E118 Type 2 diabetes mellitus with unspecified complications: Secondary | ICD-10-CM | POA: Diagnosis not present

## 2018-02-28 DIAGNOSIS — N186 End stage renal disease: Secondary | ICD-10-CM | POA: Diagnosis not present

## 2018-02-28 DIAGNOSIS — N2581 Secondary hyperparathyroidism of renal origin: Secondary | ICD-10-CM | POA: Diagnosis not present

## 2018-02-28 DIAGNOSIS — E118 Type 2 diabetes mellitus with unspecified complications: Secondary | ICD-10-CM | POA: Diagnosis not present

## 2018-03-02 DIAGNOSIS — N186 End stage renal disease: Secondary | ICD-10-CM | POA: Diagnosis not present

## 2018-03-02 DIAGNOSIS — N2581 Secondary hyperparathyroidism of renal origin: Secondary | ICD-10-CM | POA: Diagnosis not present

## 2018-03-02 DIAGNOSIS — E118 Type 2 diabetes mellitus with unspecified complications: Secondary | ICD-10-CM | POA: Diagnosis not present

## 2018-03-04 DIAGNOSIS — N2581 Secondary hyperparathyroidism of renal origin: Secondary | ICD-10-CM | POA: Diagnosis not present

## 2018-03-04 DIAGNOSIS — N186 End stage renal disease: Secondary | ICD-10-CM | POA: Diagnosis not present

## 2018-03-04 DIAGNOSIS — E118 Type 2 diabetes mellitus with unspecified complications: Secondary | ICD-10-CM | POA: Diagnosis not present

## 2018-03-06 DIAGNOSIS — Z992 Dependence on renal dialysis: Secondary | ICD-10-CM | POA: Diagnosis not present

## 2018-03-06 DIAGNOSIS — N186 End stage renal disease: Secondary | ICD-10-CM | POA: Diagnosis not present

## 2018-03-06 DIAGNOSIS — N041 Nephrotic syndrome with focal and segmental glomerular lesions: Secondary | ICD-10-CM | POA: Diagnosis not present

## 2018-03-07 DIAGNOSIS — N186 End stage renal disease: Secondary | ICD-10-CM | POA: Diagnosis not present

## 2018-03-07 DIAGNOSIS — E118 Type 2 diabetes mellitus with unspecified complications: Secondary | ICD-10-CM | POA: Diagnosis not present

## 2018-03-07 DIAGNOSIS — N2581 Secondary hyperparathyroidism of renal origin: Secondary | ICD-10-CM | POA: Diagnosis not present

## 2018-03-09 DIAGNOSIS — N186 End stage renal disease: Secondary | ICD-10-CM | POA: Diagnosis not present

## 2018-03-09 DIAGNOSIS — E118 Type 2 diabetes mellitus with unspecified complications: Secondary | ICD-10-CM | POA: Diagnosis not present

## 2018-03-09 DIAGNOSIS — N2581 Secondary hyperparathyroidism of renal origin: Secondary | ICD-10-CM | POA: Diagnosis not present

## 2018-03-11 DIAGNOSIS — N2581 Secondary hyperparathyroidism of renal origin: Secondary | ICD-10-CM | POA: Diagnosis not present

## 2018-03-11 DIAGNOSIS — N186 End stage renal disease: Secondary | ICD-10-CM | POA: Diagnosis not present

## 2018-03-11 DIAGNOSIS — E118 Type 2 diabetes mellitus with unspecified complications: Secondary | ICD-10-CM | POA: Diagnosis not present

## 2018-03-14 DIAGNOSIS — N186 End stage renal disease: Secondary | ICD-10-CM | POA: Diagnosis not present

## 2018-03-14 DIAGNOSIS — N2581 Secondary hyperparathyroidism of renal origin: Secondary | ICD-10-CM | POA: Diagnosis not present

## 2018-03-14 DIAGNOSIS — E118 Type 2 diabetes mellitus with unspecified complications: Secondary | ICD-10-CM | POA: Diagnosis not present

## 2018-03-16 DIAGNOSIS — N2581 Secondary hyperparathyroidism of renal origin: Secondary | ICD-10-CM | POA: Diagnosis not present

## 2018-03-16 DIAGNOSIS — N186 End stage renal disease: Secondary | ICD-10-CM | POA: Diagnosis not present

## 2018-03-16 DIAGNOSIS — E118 Type 2 diabetes mellitus with unspecified complications: Secondary | ICD-10-CM | POA: Diagnosis not present

## 2018-03-18 DIAGNOSIS — N186 End stage renal disease: Secondary | ICD-10-CM | POA: Diagnosis not present

## 2018-03-18 DIAGNOSIS — E118 Type 2 diabetes mellitus with unspecified complications: Secondary | ICD-10-CM | POA: Diagnosis not present

## 2018-03-18 DIAGNOSIS — N2581 Secondary hyperparathyroidism of renal origin: Secondary | ICD-10-CM | POA: Diagnosis not present

## 2018-03-21 DIAGNOSIS — E118 Type 2 diabetes mellitus with unspecified complications: Secondary | ICD-10-CM | POA: Diagnosis not present

## 2018-03-21 DIAGNOSIS — N186 End stage renal disease: Secondary | ICD-10-CM | POA: Diagnosis not present

## 2018-03-21 DIAGNOSIS — N2581 Secondary hyperparathyroidism of renal origin: Secondary | ICD-10-CM | POA: Diagnosis not present

## 2018-03-23 DIAGNOSIS — N186 End stage renal disease: Secondary | ICD-10-CM | POA: Diagnosis not present

## 2018-03-23 DIAGNOSIS — N2581 Secondary hyperparathyroidism of renal origin: Secondary | ICD-10-CM | POA: Diagnosis not present

## 2018-03-23 DIAGNOSIS — E118 Type 2 diabetes mellitus with unspecified complications: Secondary | ICD-10-CM | POA: Diagnosis not present

## 2018-03-25 DIAGNOSIS — N2581 Secondary hyperparathyroidism of renal origin: Secondary | ICD-10-CM | POA: Diagnosis not present

## 2018-03-25 DIAGNOSIS — N186 End stage renal disease: Secondary | ICD-10-CM | POA: Diagnosis not present

## 2018-03-25 DIAGNOSIS — E118 Type 2 diabetes mellitus with unspecified complications: Secondary | ICD-10-CM | POA: Diagnosis not present

## 2018-03-28 DIAGNOSIS — N186 End stage renal disease: Secondary | ICD-10-CM | POA: Diagnosis not present

## 2018-03-28 DIAGNOSIS — N2581 Secondary hyperparathyroidism of renal origin: Secondary | ICD-10-CM | POA: Diagnosis not present

## 2018-03-28 DIAGNOSIS — E118 Type 2 diabetes mellitus with unspecified complications: Secondary | ICD-10-CM | POA: Diagnosis not present

## 2018-03-30 DIAGNOSIS — N2581 Secondary hyperparathyroidism of renal origin: Secondary | ICD-10-CM | POA: Diagnosis not present

## 2018-03-30 DIAGNOSIS — E118 Type 2 diabetes mellitus with unspecified complications: Secondary | ICD-10-CM | POA: Diagnosis not present

## 2018-03-30 DIAGNOSIS — N186 End stage renal disease: Secondary | ICD-10-CM | POA: Diagnosis not present

## 2018-04-01 DIAGNOSIS — E118 Type 2 diabetes mellitus with unspecified complications: Secondary | ICD-10-CM | POA: Diagnosis not present

## 2018-04-01 DIAGNOSIS — N186 End stage renal disease: Secondary | ICD-10-CM | POA: Diagnosis not present

## 2018-04-01 DIAGNOSIS — N2581 Secondary hyperparathyroidism of renal origin: Secondary | ICD-10-CM | POA: Diagnosis not present

## 2018-04-04 DIAGNOSIS — E118 Type 2 diabetes mellitus with unspecified complications: Secondary | ICD-10-CM | POA: Diagnosis not present

## 2018-04-04 DIAGNOSIS — N2581 Secondary hyperparathyroidism of renal origin: Secondary | ICD-10-CM | POA: Diagnosis not present

## 2018-04-04 DIAGNOSIS — N186 End stage renal disease: Secondary | ICD-10-CM | POA: Diagnosis not present

## 2018-04-05 DIAGNOSIS — N041 Nephrotic syndrome with focal and segmental glomerular lesions: Secondary | ICD-10-CM | POA: Diagnosis not present

## 2018-04-05 DIAGNOSIS — N186 End stage renal disease: Secondary | ICD-10-CM | POA: Diagnosis not present

## 2018-04-05 DIAGNOSIS — Z992 Dependence on renal dialysis: Secondary | ICD-10-CM | POA: Diagnosis not present

## 2018-04-06 DIAGNOSIS — E118 Type 2 diabetes mellitus with unspecified complications: Secondary | ICD-10-CM | POA: Diagnosis not present

## 2018-04-06 DIAGNOSIS — N2581 Secondary hyperparathyroidism of renal origin: Secondary | ICD-10-CM | POA: Diagnosis not present

## 2018-04-06 DIAGNOSIS — N186 End stage renal disease: Secondary | ICD-10-CM | POA: Diagnosis not present

## 2018-04-08 DIAGNOSIS — N186 End stage renal disease: Secondary | ICD-10-CM | POA: Diagnosis not present

## 2018-04-08 DIAGNOSIS — N2581 Secondary hyperparathyroidism of renal origin: Secondary | ICD-10-CM | POA: Diagnosis not present

## 2018-04-08 DIAGNOSIS — E118 Type 2 diabetes mellitus with unspecified complications: Secondary | ICD-10-CM | POA: Diagnosis not present

## 2018-04-11 DIAGNOSIS — E118 Type 2 diabetes mellitus with unspecified complications: Secondary | ICD-10-CM | POA: Diagnosis not present

## 2018-04-11 DIAGNOSIS — N2581 Secondary hyperparathyroidism of renal origin: Secondary | ICD-10-CM | POA: Diagnosis not present

## 2018-04-11 DIAGNOSIS — N186 End stage renal disease: Secondary | ICD-10-CM | POA: Diagnosis not present

## 2018-04-13 DIAGNOSIS — N2581 Secondary hyperparathyroidism of renal origin: Secondary | ICD-10-CM | POA: Diagnosis not present

## 2018-04-13 DIAGNOSIS — E118 Type 2 diabetes mellitus with unspecified complications: Secondary | ICD-10-CM | POA: Diagnosis not present

## 2018-04-13 DIAGNOSIS — N186 End stage renal disease: Secondary | ICD-10-CM | POA: Diagnosis not present

## 2018-04-18 DIAGNOSIS — E118 Type 2 diabetes mellitus with unspecified complications: Secondary | ICD-10-CM | POA: Diagnosis not present

## 2018-04-18 DIAGNOSIS — N186 End stage renal disease: Secondary | ICD-10-CM | POA: Diagnosis not present

## 2018-04-18 DIAGNOSIS — N2581 Secondary hyperparathyroidism of renal origin: Secondary | ICD-10-CM | POA: Diagnosis not present

## 2018-04-20 DIAGNOSIS — N2581 Secondary hyperparathyroidism of renal origin: Secondary | ICD-10-CM | POA: Diagnosis not present

## 2018-04-20 DIAGNOSIS — E118 Type 2 diabetes mellitus with unspecified complications: Secondary | ICD-10-CM | POA: Diagnosis not present

## 2018-04-20 DIAGNOSIS — N186 End stage renal disease: Secondary | ICD-10-CM | POA: Diagnosis not present

## 2018-04-22 DIAGNOSIS — N186 End stage renal disease: Secondary | ICD-10-CM | POA: Diagnosis not present

## 2018-04-22 DIAGNOSIS — N2581 Secondary hyperparathyroidism of renal origin: Secondary | ICD-10-CM | POA: Diagnosis not present

## 2018-04-22 DIAGNOSIS — E118 Type 2 diabetes mellitus with unspecified complications: Secondary | ICD-10-CM | POA: Diagnosis not present

## 2018-04-25 DIAGNOSIS — N186 End stage renal disease: Secondary | ICD-10-CM | POA: Diagnosis not present

## 2018-04-25 DIAGNOSIS — N2581 Secondary hyperparathyroidism of renal origin: Secondary | ICD-10-CM | POA: Diagnosis not present

## 2018-04-25 DIAGNOSIS — E118 Type 2 diabetes mellitus with unspecified complications: Secondary | ICD-10-CM | POA: Diagnosis not present

## 2018-04-27 DIAGNOSIS — E118 Type 2 diabetes mellitus with unspecified complications: Secondary | ICD-10-CM | POA: Diagnosis not present

## 2018-04-27 DIAGNOSIS — N186 End stage renal disease: Secondary | ICD-10-CM | POA: Diagnosis not present

## 2018-04-27 DIAGNOSIS — N2581 Secondary hyperparathyroidism of renal origin: Secondary | ICD-10-CM | POA: Diagnosis not present

## 2018-04-29 DIAGNOSIS — N2581 Secondary hyperparathyroidism of renal origin: Secondary | ICD-10-CM | POA: Diagnosis not present

## 2018-04-29 DIAGNOSIS — E118 Type 2 diabetes mellitus with unspecified complications: Secondary | ICD-10-CM | POA: Diagnosis not present

## 2018-04-29 DIAGNOSIS — N186 End stage renal disease: Secondary | ICD-10-CM | POA: Diagnosis not present

## 2018-05-02 DIAGNOSIS — E118 Type 2 diabetes mellitus with unspecified complications: Secondary | ICD-10-CM | POA: Diagnosis not present

## 2018-05-02 DIAGNOSIS — N186 End stage renal disease: Secondary | ICD-10-CM | POA: Diagnosis not present

## 2018-05-02 DIAGNOSIS — N2581 Secondary hyperparathyroidism of renal origin: Secondary | ICD-10-CM | POA: Diagnosis not present

## 2018-05-04 DIAGNOSIS — E118 Type 2 diabetes mellitus with unspecified complications: Secondary | ICD-10-CM | POA: Diagnosis not present

## 2018-05-04 DIAGNOSIS — N2581 Secondary hyperparathyroidism of renal origin: Secondary | ICD-10-CM | POA: Diagnosis not present

## 2018-05-04 DIAGNOSIS — N186 End stage renal disease: Secondary | ICD-10-CM | POA: Diagnosis not present

## 2018-05-06 DIAGNOSIS — E118 Type 2 diabetes mellitus with unspecified complications: Secondary | ICD-10-CM | POA: Diagnosis not present

## 2018-05-06 DIAGNOSIS — N2581 Secondary hyperparathyroidism of renal origin: Secondary | ICD-10-CM | POA: Diagnosis not present

## 2018-05-06 DIAGNOSIS — N041 Nephrotic syndrome with focal and segmental glomerular lesions: Secondary | ICD-10-CM | POA: Diagnosis not present

## 2018-05-06 DIAGNOSIS — N186 End stage renal disease: Secondary | ICD-10-CM | POA: Diagnosis not present

## 2018-05-06 DIAGNOSIS — Z992 Dependence on renal dialysis: Secondary | ICD-10-CM | POA: Diagnosis not present

## 2018-05-09 DIAGNOSIS — E118 Type 2 diabetes mellitus with unspecified complications: Secondary | ICD-10-CM | POA: Diagnosis not present

## 2018-05-09 DIAGNOSIS — N2581 Secondary hyperparathyroidism of renal origin: Secondary | ICD-10-CM | POA: Diagnosis not present

## 2018-05-09 DIAGNOSIS — N186 End stage renal disease: Secondary | ICD-10-CM | POA: Diagnosis not present

## 2018-05-11 DIAGNOSIS — N2581 Secondary hyperparathyroidism of renal origin: Secondary | ICD-10-CM | POA: Diagnosis not present

## 2018-05-11 DIAGNOSIS — E118 Type 2 diabetes mellitus with unspecified complications: Secondary | ICD-10-CM | POA: Diagnosis not present

## 2018-05-11 DIAGNOSIS — N186 End stage renal disease: Secondary | ICD-10-CM | POA: Diagnosis not present

## 2018-05-13 DIAGNOSIS — E118 Type 2 diabetes mellitus with unspecified complications: Secondary | ICD-10-CM | POA: Diagnosis not present

## 2018-05-13 DIAGNOSIS — N186 End stage renal disease: Secondary | ICD-10-CM | POA: Diagnosis not present

## 2018-05-13 DIAGNOSIS — N2581 Secondary hyperparathyroidism of renal origin: Secondary | ICD-10-CM | POA: Diagnosis not present

## 2018-05-16 DIAGNOSIS — E118 Type 2 diabetes mellitus with unspecified complications: Secondary | ICD-10-CM | POA: Diagnosis not present

## 2018-05-16 DIAGNOSIS — N2581 Secondary hyperparathyroidism of renal origin: Secondary | ICD-10-CM | POA: Diagnosis not present

## 2018-05-16 DIAGNOSIS — N186 End stage renal disease: Secondary | ICD-10-CM | POA: Diagnosis not present

## 2018-05-18 DIAGNOSIS — E118 Type 2 diabetes mellitus with unspecified complications: Secondary | ICD-10-CM | POA: Diagnosis not present

## 2018-05-18 DIAGNOSIS — N186 End stage renal disease: Secondary | ICD-10-CM | POA: Diagnosis not present

## 2018-05-18 DIAGNOSIS — N2581 Secondary hyperparathyroidism of renal origin: Secondary | ICD-10-CM | POA: Diagnosis not present

## 2018-05-20 DIAGNOSIS — N186 End stage renal disease: Secondary | ICD-10-CM | POA: Diagnosis not present

## 2018-05-20 DIAGNOSIS — N2581 Secondary hyperparathyroidism of renal origin: Secondary | ICD-10-CM | POA: Diagnosis not present

## 2018-05-20 DIAGNOSIS — E118 Type 2 diabetes mellitus with unspecified complications: Secondary | ICD-10-CM | POA: Diagnosis not present

## 2018-05-23 DIAGNOSIS — N186 End stage renal disease: Secondary | ICD-10-CM | POA: Diagnosis not present

## 2018-05-23 DIAGNOSIS — E118 Type 2 diabetes mellitus with unspecified complications: Secondary | ICD-10-CM | POA: Diagnosis not present

## 2018-05-23 DIAGNOSIS — N2581 Secondary hyperparathyroidism of renal origin: Secondary | ICD-10-CM | POA: Diagnosis not present

## 2018-05-25 DIAGNOSIS — N2581 Secondary hyperparathyroidism of renal origin: Secondary | ICD-10-CM | POA: Diagnosis not present

## 2018-05-25 DIAGNOSIS — E118 Type 2 diabetes mellitus with unspecified complications: Secondary | ICD-10-CM | POA: Diagnosis not present

## 2018-05-25 DIAGNOSIS — N186 End stage renal disease: Secondary | ICD-10-CM | POA: Diagnosis not present

## 2018-05-27 DIAGNOSIS — N2581 Secondary hyperparathyroidism of renal origin: Secondary | ICD-10-CM | POA: Diagnosis not present

## 2018-05-27 DIAGNOSIS — N186 End stage renal disease: Secondary | ICD-10-CM | POA: Diagnosis not present

## 2018-05-27 DIAGNOSIS — E118 Type 2 diabetes mellitus with unspecified complications: Secondary | ICD-10-CM | POA: Diagnosis not present

## 2018-05-30 DIAGNOSIS — N2581 Secondary hyperparathyroidism of renal origin: Secondary | ICD-10-CM | POA: Diagnosis not present

## 2018-05-30 DIAGNOSIS — E118 Type 2 diabetes mellitus with unspecified complications: Secondary | ICD-10-CM | POA: Diagnosis not present

## 2018-05-30 DIAGNOSIS — N186 End stage renal disease: Secondary | ICD-10-CM | POA: Diagnosis not present

## 2018-06-01 DIAGNOSIS — N2581 Secondary hyperparathyroidism of renal origin: Secondary | ICD-10-CM | POA: Diagnosis not present

## 2018-06-01 DIAGNOSIS — E118 Type 2 diabetes mellitus with unspecified complications: Secondary | ICD-10-CM | POA: Diagnosis not present

## 2018-06-01 DIAGNOSIS — N186 End stage renal disease: Secondary | ICD-10-CM | POA: Diagnosis not present

## 2018-06-03 DIAGNOSIS — N2581 Secondary hyperparathyroidism of renal origin: Secondary | ICD-10-CM | POA: Diagnosis not present

## 2018-06-03 DIAGNOSIS — E118 Type 2 diabetes mellitus with unspecified complications: Secondary | ICD-10-CM | POA: Diagnosis not present

## 2018-06-03 DIAGNOSIS — N186 End stage renal disease: Secondary | ICD-10-CM | POA: Diagnosis not present

## 2018-06-05 DIAGNOSIS — Z992 Dependence on renal dialysis: Secondary | ICD-10-CM | POA: Diagnosis not present

## 2018-06-05 DIAGNOSIS — N186 End stage renal disease: Secondary | ICD-10-CM | POA: Diagnosis not present

## 2018-06-05 DIAGNOSIS — N041 Nephrotic syndrome with focal and segmental glomerular lesions: Secondary | ICD-10-CM | POA: Diagnosis not present

## 2018-06-06 DIAGNOSIS — N2581 Secondary hyperparathyroidism of renal origin: Secondary | ICD-10-CM | POA: Diagnosis not present

## 2018-06-06 DIAGNOSIS — N186 End stage renal disease: Secondary | ICD-10-CM | POA: Diagnosis not present

## 2018-06-08 DIAGNOSIS — N186 End stage renal disease: Secondary | ICD-10-CM | POA: Diagnosis not present

## 2018-06-08 DIAGNOSIS — N2581 Secondary hyperparathyroidism of renal origin: Secondary | ICD-10-CM | POA: Diagnosis not present

## 2018-06-10 DIAGNOSIS — N186 End stage renal disease: Secondary | ICD-10-CM | POA: Diagnosis not present

## 2018-06-10 DIAGNOSIS — N2581 Secondary hyperparathyroidism of renal origin: Secondary | ICD-10-CM | POA: Diagnosis not present

## 2018-06-13 DIAGNOSIS — N186 End stage renal disease: Secondary | ICD-10-CM | POA: Diagnosis not present

## 2018-06-13 DIAGNOSIS — N2581 Secondary hyperparathyroidism of renal origin: Secondary | ICD-10-CM | POA: Diagnosis not present

## 2018-06-15 DIAGNOSIS — N186 End stage renal disease: Secondary | ICD-10-CM | POA: Diagnosis not present

## 2018-06-15 DIAGNOSIS — N2581 Secondary hyperparathyroidism of renal origin: Secondary | ICD-10-CM | POA: Diagnosis not present

## 2018-06-17 DIAGNOSIS — N186 End stage renal disease: Secondary | ICD-10-CM | POA: Diagnosis not present

## 2018-06-17 DIAGNOSIS — N2581 Secondary hyperparathyroidism of renal origin: Secondary | ICD-10-CM | POA: Diagnosis not present

## 2018-06-20 DIAGNOSIS — N186 End stage renal disease: Secondary | ICD-10-CM | POA: Diagnosis not present

## 2018-06-20 DIAGNOSIS — N2581 Secondary hyperparathyroidism of renal origin: Secondary | ICD-10-CM | POA: Diagnosis not present

## 2018-06-22 DIAGNOSIS — N186 End stage renal disease: Secondary | ICD-10-CM | POA: Diagnosis not present

## 2018-06-22 DIAGNOSIS — N2581 Secondary hyperparathyroidism of renal origin: Secondary | ICD-10-CM | POA: Diagnosis not present

## 2018-06-22 DIAGNOSIS — E118 Type 2 diabetes mellitus with unspecified complications: Secondary | ICD-10-CM | POA: Diagnosis not present

## 2018-06-24 DIAGNOSIS — N186 End stage renal disease: Secondary | ICD-10-CM | POA: Diagnosis not present

## 2018-06-24 DIAGNOSIS — N2581 Secondary hyperparathyroidism of renal origin: Secondary | ICD-10-CM | POA: Diagnosis not present

## 2018-06-27 DIAGNOSIS — N2581 Secondary hyperparathyroidism of renal origin: Secondary | ICD-10-CM | POA: Diagnosis not present

## 2018-06-27 DIAGNOSIS — N186 End stage renal disease: Secondary | ICD-10-CM | POA: Diagnosis not present

## 2018-06-29 DIAGNOSIS — N186 End stage renal disease: Secondary | ICD-10-CM | POA: Diagnosis not present

## 2018-06-29 DIAGNOSIS — N2581 Secondary hyperparathyroidism of renal origin: Secondary | ICD-10-CM | POA: Diagnosis not present

## 2018-07-01 DIAGNOSIS — N2581 Secondary hyperparathyroidism of renal origin: Secondary | ICD-10-CM | POA: Diagnosis not present

## 2018-07-01 DIAGNOSIS — N186 End stage renal disease: Secondary | ICD-10-CM | POA: Diagnosis not present

## 2018-07-04 DIAGNOSIS — N186 End stage renal disease: Secondary | ICD-10-CM | POA: Diagnosis not present

## 2018-07-04 DIAGNOSIS — N2581 Secondary hyperparathyroidism of renal origin: Secondary | ICD-10-CM | POA: Diagnosis not present

## 2018-07-06 DIAGNOSIS — E118 Type 2 diabetes mellitus with unspecified complications: Secondary | ICD-10-CM | POA: Diagnosis not present

## 2018-07-06 DIAGNOSIS — N2581 Secondary hyperparathyroidism of renal origin: Secondary | ICD-10-CM | POA: Diagnosis not present

## 2018-07-06 DIAGNOSIS — N041 Nephrotic syndrome with focal and segmental glomerular lesions: Secondary | ICD-10-CM | POA: Diagnosis not present

## 2018-07-06 DIAGNOSIS — N186 End stage renal disease: Secondary | ICD-10-CM | POA: Diagnosis not present

## 2018-07-06 DIAGNOSIS — Z992 Dependence on renal dialysis: Secondary | ICD-10-CM | POA: Diagnosis not present

## 2018-07-11 ENCOUNTER — Telehealth: Payer: Self-pay | Admitting: Family Medicine

## 2018-07-11 DIAGNOSIS — N2581 Secondary hyperparathyroidism of renal origin: Secondary | ICD-10-CM | POA: Diagnosis not present

## 2018-07-11 DIAGNOSIS — N186 End stage renal disease: Secondary | ICD-10-CM | POA: Diagnosis not present

## 2018-07-11 DIAGNOSIS — E118 Type 2 diabetes mellitus with unspecified complications: Secondary | ICD-10-CM | POA: Diagnosis not present

## 2018-07-13 DIAGNOSIS — N186 End stage renal disease: Secondary | ICD-10-CM | POA: Diagnosis not present

## 2018-07-13 DIAGNOSIS — E118 Type 2 diabetes mellitus with unspecified complications: Secondary | ICD-10-CM | POA: Diagnosis not present

## 2018-07-13 DIAGNOSIS — N2581 Secondary hyperparathyroidism of renal origin: Secondary | ICD-10-CM | POA: Diagnosis not present

## 2018-07-15 DIAGNOSIS — E118 Type 2 diabetes mellitus with unspecified complications: Secondary | ICD-10-CM | POA: Diagnosis not present

## 2018-07-15 DIAGNOSIS — N186 End stage renal disease: Secondary | ICD-10-CM | POA: Diagnosis not present

## 2018-07-15 DIAGNOSIS — N2581 Secondary hyperparathyroidism of renal origin: Secondary | ICD-10-CM | POA: Diagnosis not present

## 2018-07-18 DIAGNOSIS — E118 Type 2 diabetes mellitus with unspecified complications: Secondary | ICD-10-CM | POA: Diagnosis not present

## 2018-07-18 DIAGNOSIS — N2581 Secondary hyperparathyroidism of renal origin: Secondary | ICD-10-CM | POA: Diagnosis not present

## 2018-07-18 DIAGNOSIS — N186 End stage renal disease: Secondary | ICD-10-CM | POA: Diagnosis not present

## 2018-07-20 DIAGNOSIS — N2581 Secondary hyperparathyroidism of renal origin: Secondary | ICD-10-CM | POA: Diagnosis not present

## 2018-07-20 DIAGNOSIS — N186 End stage renal disease: Secondary | ICD-10-CM | POA: Diagnosis not present

## 2018-07-20 DIAGNOSIS — E118 Type 2 diabetes mellitus with unspecified complications: Secondary | ICD-10-CM | POA: Diagnosis not present

## 2018-07-22 DIAGNOSIS — E118 Type 2 diabetes mellitus with unspecified complications: Secondary | ICD-10-CM | POA: Diagnosis not present

## 2018-07-22 DIAGNOSIS — N186 End stage renal disease: Secondary | ICD-10-CM | POA: Diagnosis not present

## 2018-07-22 DIAGNOSIS — N2581 Secondary hyperparathyroidism of renal origin: Secondary | ICD-10-CM | POA: Diagnosis not present

## 2018-07-25 DIAGNOSIS — N2581 Secondary hyperparathyroidism of renal origin: Secondary | ICD-10-CM | POA: Diagnosis not present

## 2018-07-25 DIAGNOSIS — E118 Type 2 diabetes mellitus with unspecified complications: Secondary | ICD-10-CM | POA: Diagnosis not present

## 2018-07-25 DIAGNOSIS — N186 End stage renal disease: Secondary | ICD-10-CM | POA: Diagnosis not present

## 2018-07-27 DIAGNOSIS — N2581 Secondary hyperparathyroidism of renal origin: Secondary | ICD-10-CM | POA: Diagnosis not present

## 2018-07-27 DIAGNOSIS — E118 Type 2 diabetes mellitus with unspecified complications: Secondary | ICD-10-CM | POA: Diagnosis not present

## 2018-07-27 DIAGNOSIS — N186 End stage renal disease: Secondary | ICD-10-CM | POA: Diagnosis not present

## 2018-07-29 DIAGNOSIS — N2581 Secondary hyperparathyroidism of renal origin: Secondary | ICD-10-CM | POA: Diagnosis not present

## 2018-07-29 DIAGNOSIS — N186 End stage renal disease: Secondary | ICD-10-CM | POA: Diagnosis not present

## 2018-07-29 DIAGNOSIS — E118 Type 2 diabetes mellitus with unspecified complications: Secondary | ICD-10-CM | POA: Diagnosis not present

## 2018-08-01 DIAGNOSIS — E118 Type 2 diabetes mellitus with unspecified complications: Secondary | ICD-10-CM | POA: Diagnosis not present

## 2018-08-01 DIAGNOSIS — N2581 Secondary hyperparathyroidism of renal origin: Secondary | ICD-10-CM | POA: Diagnosis not present

## 2018-08-01 DIAGNOSIS — N186 End stage renal disease: Secondary | ICD-10-CM | POA: Diagnosis not present

## 2018-08-03 DIAGNOSIS — E118 Type 2 diabetes mellitus with unspecified complications: Secondary | ICD-10-CM | POA: Diagnosis not present

## 2018-08-03 DIAGNOSIS — N2581 Secondary hyperparathyroidism of renal origin: Secondary | ICD-10-CM | POA: Diagnosis not present

## 2018-08-03 DIAGNOSIS — N186 End stage renal disease: Secondary | ICD-10-CM | POA: Diagnosis not present

## 2018-08-05 DIAGNOSIS — N186 End stage renal disease: Secondary | ICD-10-CM | POA: Diagnosis not present

## 2018-08-05 DIAGNOSIS — N2581 Secondary hyperparathyroidism of renal origin: Secondary | ICD-10-CM | POA: Diagnosis not present

## 2018-08-05 DIAGNOSIS — E118 Type 2 diabetes mellitus with unspecified complications: Secondary | ICD-10-CM | POA: Diagnosis not present

## 2018-08-06 DIAGNOSIS — N041 Nephrotic syndrome with focal and segmental glomerular lesions: Secondary | ICD-10-CM | POA: Diagnosis not present

## 2018-08-06 DIAGNOSIS — N186 End stage renal disease: Secondary | ICD-10-CM | POA: Diagnosis not present

## 2018-08-06 DIAGNOSIS — Z992 Dependence on renal dialysis: Secondary | ICD-10-CM | POA: Diagnosis not present

## 2018-08-08 DIAGNOSIS — E118 Type 2 diabetes mellitus with unspecified complications: Secondary | ICD-10-CM | POA: Diagnosis not present

## 2018-08-08 DIAGNOSIS — Z23 Encounter for immunization: Secondary | ICD-10-CM | POA: Diagnosis not present

## 2018-08-08 DIAGNOSIS — N186 End stage renal disease: Secondary | ICD-10-CM | POA: Diagnosis not present

## 2018-08-08 DIAGNOSIS — N2581 Secondary hyperparathyroidism of renal origin: Secondary | ICD-10-CM | POA: Diagnosis not present

## 2018-08-10 DIAGNOSIS — E118 Type 2 diabetes mellitus with unspecified complications: Secondary | ICD-10-CM | POA: Diagnosis not present

## 2018-08-10 DIAGNOSIS — N186 End stage renal disease: Secondary | ICD-10-CM | POA: Diagnosis not present

## 2018-08-10 DIAGNOSIS — N2581 Secondary hyperparathyroidism of renal origin: Secondary | ICD-10-CM | POA: Diagnosis not present

## 2018-08-10 DIAGNOSIS — Z23 Encounter for immunization: Secondary | ICD-10-CM | POA: Diagnosis not present

## 2018-08-12 DIAGNOSIS — N2581 Secondary hyperparathyroidism of renal origin: Secondary | ICD-10-CM | POA: Diagnosis not present

## 2018-08-12 DIAGNOSIS — N186 End stage renal disease: Secondary | ICD-10-CM | POA: Diagnosis not present

## 2018-08-12 DIAGNOSIS — E118 Type 2 diabetes mellitus with unspecified complications: Secondary | ICD-10-CM | POA: Diagnosis not present

## 2018-08-12 DIAGNOSIS — Z23 Encounter for immunization: Secondary | ICD-10-CM | POA: Diagnosis not present

## 2018-08-15 DIAGNOSIS — Z23 Encounter for immunization: Secondary | ICD-10-CM | POA: Diagnosis not present

## 2018-08-15 DIAGNOSIS — E118 Type 2 diabetes mellitus with unspecified complications: Secondary | ICD-10-CM | POA: Diagnosis not present

## 2018-08-15 DIAGNOSIS — N2581 Secondary hyperparathyroidism of renal origin: Secondary | ICD-10-CM | POA: Diagnosis not present

## 2018-08-15 DIAGNOSIS — N186 End stage renal disease: Secondary | ICD-10-CM | POA: Diagnosis not present

## 2018-08-17 DIAGNOSIS — N2581 Secondary hyperparathyroidism of renal origin: Secondary | ICD-10-CM | POA: Diagnosis not present

## 2018-08-17 DIAGNOSIS — E118 Type 2 diabetes mellitus with unspecified complications: Secondary | ICD-10-CM | POA: Diagnosis not present

## 2018-08-17 DIAGNOSIS — Z23 Encounter for immunization: Secondary | ICD-10-CM | POA: Diagnosis not present

## 2018-08-17 DIAGNOSIS — N186 End stage renal disease: Secondary | ICD-10-CM | POA: Diagnosis not present

## 2018-08-19 DIAGNOSIS — N186 End stage renal disease: Secondary | ICD-10-CM | POA: Diagnosis not present

## 2018-08-19 DIAGNOSIS — N2581 Secondary hyperparathyroidism of renal origin: Secondary | ICD-10-CM | POA: Diagnosis not present

## 2018-08-19 DIAGNOSIS — Z23 Encounter for immunization: Secondary | ICD-10-CM | POA: Diagnosis not present

## 2018-08-19 DIAGNOSIS — E118 Type 2 diabetes mellitus with unspecified complications: Secondary | ICD-10-CM | POA: Diagnosis not present

## 2018-08-22 DIAGNOSIS — Z23 Encounter for immunization: Secondary | ICD-10-CM | POA: Diagnosis not present

## 2018-08-22 DIAGNOSIS — N186 End stage renal disease: Secondary | ICD-10-CM | POA: Diagnosis not present

## 2018-08-22 DIAGNOSIS — E118 Type 2 diabetes mellitus with unspecified complications: Secondary | ICD-10-CM | POA: Diagnosis not present

## 2018-08-22 DIAGNOSIS — N2581 Secondary hyperparathyroidism of renal origin: Secondary | ICD-10-CM | POA: Diagnosis not present

## 2018-08-24 DIAGNOSIS — N186 End stage renal disease: Secondary | ICD-10-CM | POA: Diagnosis not present

## 2018-08-24 DIAGNOSIS — N2581 Secondary hyperparathyroidism of renal origin: Secondary | ICD-10-CM | POA: Diagnosis not present

## 2018-08-24 DIAGNOSIS — Z23 Encounter for immunization: Secondary | ICD-10-CM | POA: Diagnosis not present

## 2018-08-24 DIAGNOSIS — E118 Type 2 diabetes mellitus with unspecified complications: Secondary | ICD-10-CM | POA: Diagnosis not present

## 2018-08-26 DIAGNOSIS — N186 End stage renal disease: Secondary | ICD-10-CM | POA: Diagnosis not present

## 2018-08-26 DIAGNOSIS — N2581 Secondary hyperparathyroidism of renal origin: Secondary | ICD-10-CM | POA: Diagnosis not present

## 2018-08-26 DIAGNOSIS — Z23 Encounter for immunization: Secondary | ICD-10-CM | POA: Diagnosis not present

## 2018-08-26 DIAGNOSIS — E118 Type 2 diabetes mellitus with unspecified complications: Secondary | ICD-10-CM | POA: Diagnosis not present

## 2018-08-29 DIAGNOSIS — N186 End stage renal disease: Secondary | ICD-10-CM | POA: Diagnosis not present

## 2018-08-29 DIAGNOSIS — Z23 Encounter for immunization: Secondary | ICD-10-CM | POA: Diagnosis not present

## 2018-08-29 DIAGNOSIS — N2581 Secondary hyperparathyroidism of renal origin: Secondary | ICD-10-CM | POA: Diagnosis not present

## 2018-08-29 DIAGNOSIS — E118 Type 2 diabetes mellitus with unspecified complications: Secondary | ICD-10-CM | POA: Diagnosis not present

## 2018-08-31 DIAGNOSIS — N2581 Secondary hyperparathyroidism of renal origin: Secondary | ICD-10-CM | POA: Diagnosis not present

## 2018-08-31 DIAGNOSIS — Z23 Encounter for immunization: Secondary | ICD-10-CM | POA: Diagnosis not present

## 2018-08-31 DIAGNOSIS — N186 End stage renal disease: Secondary | ICD-10-CM | POA: Diagnosis not present

## 2018-08-31 DIAGNOSIS — E118 Type 2 diabetes mellitus with unspecified complications: Secondary | ICD-10-CM | POA: Diagnosis not present

## 2018-09-02 DIAGNOSIS — N2581 Secondary hyperparathyroidism of renal origin: Secondary | ICD-10-CM | POA: Diagnosis not present

## 2018-09-02 DIAGNOSIS — N186 End stage renal disease: Secondary | ICD-10-CM | POA: Diagnosis not present

## 2018-09-02 DIAGNOSIS — Z23 Encounter for immunization: Secondary | ICD-10-CM | POA: Diagnosis not present

## 2018-09-02 DIAGNOSIS — E118 Type 2 diabetes mellitus with unspecified complications: Secondary | ICD-10-CM | POA: Diagnosis not present

## 2018-09-05 DIAGNOSIS — Z992 Dependence on renal dialysis: Secondary | ICD-10-CM | POA: Diagnosis not present

## 2018-09-05 DIAGNOSIS — E118 Type 2 diabetes mellitus with unspecified complications: Secondary | ICD-10-CM | POA: Diagnosis not present

## 2018-09-05 DIAGNOSIS — N186 End stage renal disease: Secondary | ICD-10-CM | POA: Diagnosis not present

## 2018-09-05 DIAGNOSIS — N2581 Secondary hyperparathyroidism of renal origin: Secondary | ICD-10-CM | POA: Diagnosis not present

## 2018-09-05 DIAGNOSIS — N041 Nephrotic syndrome with focal and segmental glomerular lesions: Secondary | ICD-10-CM | POA: Diagnosis not present

## 2018-09-07 DIAGNOSIS — N186 End stage renal disease: Secondary | ICD-10-CM | POA: Diagnosis not present

## 2018-09-07 DIAGNOSIS — N2581 Secondary hyperparathyroidism of renal origin: Secondary | ICD-10-CM | POA: Diagnosis not present

## 2018-09-07 DIAGNOSIS — E118 Type 2 diabetes mellitus with unspecified complications: Secondary | ICD-10-CM | POA: Diagnosis not present

## 2018-09-12 DIAGNOSIS — N186 End stage renal disease: Secondary | ICD-10-CM | POA: Diagnosis not present

## 2018-09-12 DIAGNOSIS — E118 Type 2 diabetes mellitus with unspecified complications: Secondary | ICD-10-CM | POA: Diagnosis not present

## 2018-09-12 DIAGNOSIS — N2581 Secondary hyperparathyroidism of renal origin: Secondary | ICD-10-CM | POA: Diagnosis not present

## 2018-09-14 DIAGNOSIS — N186 End stage renal disease: Secondary | ICD-10-CM | POA: Diagnosis not present

## 2018-09-14 DIAGNOSIS — N2581 Secondary hyperparathyroidism of renal origin: Secondary | ICD-10-CM | POA: Diagnosis not present

## 2018-09-14 DIAGNOSIS — E118 Type 2 diabetes mellitus with unspecified complications: Secondary | ICD-10-CM | POA: Diagnosis not present

## 2018-09-16 DIAGNOSIS — N2581 Secondary hyperparathyroidism of renal origin: Secondary | ICD-10-CM | POA: Diagnosis not present

## 2018-09-16 DIAGNOSIS — N186 End stage renal disease: Secondary | ICD-10-CM | POA: Diagnosis not present

## 2018-09-16 DIAGNOSIS — E118 Type 2 diabetes mellitus with unspecified complications: Secondary | ICD-10-CM | POA: Diagnosis not present

## 2018-09-19 DIAGNOSIS — N186 End stage renal disease: Secondary | ICD-10-CM | POA: Diagnosis not present

## 2018-09-19 DIAGNOSIS — N2581 Secondary hyperparathyroidism of renal origin: Secondary | ICD-10-CM | POA: Diagnosis not present

## 2018-09-19 DIAGNOSIS — E118 Type 2 diabetes mellitus with unspecified complications: Secondary | ICD-10-CM | POA: Diagnosis not present

## 2018-09-21 DIAGNOSIS — N186 End stage renal disease: Secondary | ICD-10-CM | POA: Diagnosis not present

## 2018-09-21 DIAGNOSIS — N2581 Secondary hyperparathyroidism of renal origin: Secondary | ICD-10-CM | POA: Diagnosis not present

## 2018-09-21 DIAGNOSIS — E118 Type 2 diabetes mellitus with unspecified complications: Secondary | ICD-10-CM | POA: Diagnosis not present

## 2018-09-22 ENCOUNTER — Other Ambulatory Visit: Payer: Self-pay | Admitting: Family Medicine

## 2018-09-22 DIAGNOSIS — E118 Type 2 diabetes mellitus with unspecified complications: Secondary | ICD-10-CM

## 2018-09-22 DIAGNOSIS — Z794 Long term (current) use of insulin: Principal | ICD-10-CM

## 2018-09-23 DIAGNOSIS — N2581 Secondary hyperparathyroidism of renal origin: Secondary | ICD-10-CM | POA: Diagnosis not present

## 2018-09-23 DIAGNOSIS — E118 Type 2 diabetes mellitus with unspecified complications: Secondary | ICD-10-CM | POA: Diagnosis not present

## 2018-09-23 DIAGNOSIS — N186 End stage renal disease: Secondary | ICD-10-CM | POA: Diagnosis not present

## 2018-09-25 NOTE — Telephone Encounter (Signed)
Last seen 09/05/17  Needs to be seen    Dr Warrick Parisian

## 2018-09-25 NOTE — Telephone Encounter (Signed)
Attempted to contact patient - NA °

## 2018-09-26 DIAGNOSIS — N186 End stage renal disease: Secondary | ICD-10-CM | POA: Diagnosis not present

## 2018-09-26 DIAGNOSIS — E118 Type 2 diabetes mellitus with unspecified complications: Secondary | ICD-10-CM | POA: Diagnosis not present

## 2018-09-26 DIAGNOSIS — N2581 Secondary hyperparathyroidism of renal origin: Secondary | ICD-10-CM | POA: Diagnosis not present

## 2018-09-28 DIAGNOSIS — N2581 Secondary hyperparathyroidism of renal origin: Secondary | ICD-10-CM | POA: Diagnosis not present

## 2018-09-28 DIAGNOSIS — E118 Type 2 diabetes mellitus with unspecified complications: Secondary | ICD-10-CM | POA: Diagnosis not present

## 2018-09-28 DIAGNOSIS — N186 End stage renal disease: Secondary | ICD-10-CM | POA: Diagnosis not present

## 2018-09-30 DIAGNOSIS — E118 Type 2 diabetes mellitus with unspecified complications: Secondary | ICD-10-CM | POA: Diagnosis not present

## 2018-09-30 DIAGNOSIS — N186 End stage renal disease: Secondary | ICD-10-CM | POA: Diagnosis not present

## 2018-09-30 DIAGNOSIS — N2581 Secondary hyperparathyroidism of renal origin: Secondary | ICD-10-CM | POA: Diagnosis not present

## 2018-10-03 DIAGNOSIS — N2581 Secondary hyperparathyroidism of renal origin: Secondary | ICD-10-CM | POA: Diagnosis not present

## 2018-10-03 DIAGNOSIS — E118 Type 2 diabetes mellitus with unspecified complications: Secondary | ICD-10-CM | POA: Diagnosis not present

## 2018-10-03 DIAGNOSIS — N186 End stage renal disease: Secondary | ICD-10-CM | POA: Diagnosis not present

## 2018-10-05 DIAGNOSIS — E118 Type 2 diabetes mellitus with unspecified complications: Secondary | ICD-10-CM | POA: Diagnosis not present

## 2018-10-05 DIAGNOSIS — N186 End stage renal disease: Secondary | ICD-10-CM | POA: Diagnosis not present

## 2018-10-05 DIAGNOSIS — N2581 Secondary hyperparathyroidism of renal origin: Secondary | ICD-10-CM | POA: Diagnosis not present

## 2018-10-06 DIAGNOSIS — N041 Nephrotic syndrome with focal and segmental glomerular lesions: Secondary | ICD-10-CM | POA: Diagnosis not present

## 2018-10-06 DIAGNOSIS — N186 End stage renal disease: Secondary | ICD-10-CM | POA: Diagnosis not present

## 2018-10-06 DIAGNOSIS — Z992 Dependence on renal dialysis: Secondary | ICD-10-CM | POA: Diagnosis not present

## 2018-10-07 DIAGNOSIS — N186 End stage renal disease: Secondary | ICD-10-CM | POA: Diagnosis not present

## 2018-10-07 DIAGNOSIS — E118 Type 2 diabetes mellitus with unspecified complications: Secondary | ICD-10-CM | POA: Diagnosis not present

## 2018-10-07 DIAGNOSIS — N2581 Secondary hyperparathyroidism of renal origin: Secondary | ICD-10-CM | POA: Diagnosis not present

## 2018-10-10 DIAGNOSIS — N186 End stage renal disease: Secondary | ICD-10-CM | POA: Diagnosis not present

## 2018-10-10 DIAGNOSIS — N2581 Secondary hyperparathyroidism of renal origin: Secondary | ICD-10-CM | POA: Diagnosis not present

## 2018-10-10 DIAGNOSIS — E118 Type 2 diabetes mellitus with unspecified complications: Secondary | ICD-10-CM | POA: Diagnosis not present

## 2018-10-12 DIAGNOSIS — E118 Type 2 diabetes mellitus with unspecified complications: Secondary | ICD-10-CM | POA: Diagnosis not present

## 2018-10-12 DIAGNOSIS — N186 End stage renal disease: Secondary | ICD-10-CM | POA: Diagnosis not present

## 2018-10-12 DIAGNOSIS — N2581 Secondary hyperparathyroidism of renal origin: Secondary | ICD-10-CM | POA: Diagnosis not present

## 2018-10-14 DIAGNOSIS — E118 Type 2 diabetes mellitus with unspecified complications: Secondary | ICD-10-CM | POA: Diagnosis not present

## 2018-10-14 DIAGNOSIS — N186 End stage renal disease: Secondary | ICD-10-CM | POA: Diagnosis not present

## 2018-10-14 DIAGNOSIS — N2581 Secondary hyperparathyroidism of renal origin: Secondary | ICD-10-CM | POA: Diagnosis not present

## 2018-10-17 DIAGNOSIS — N186 End stage renal disease: Secondary | ICD-10-CM | POA: Diagnosis not present

## 2018-10-17 DIAGNOSIS — N2581 Secondary hyperparathyroidism of renal origin: Secondary | ICD-10-CM | POA: Diagnosis not present

## 2018-10-17 DIAGNOSIS — E118 Type 2 diabetes mellitus with unspecified complications: Secondary | ICD-10-CM | POA: Diagnosis not present

## 2018-10-19 DIAGNOSIS — N2581 Secondary hyperparathyroidism of renal origin: Secondary | ICD-10-CM | POA: Diagnosis not present

## 2018-10-19 DIAGNOSIS — E118 Type 2 diabetes mellitus with unspecified complications: Secondary | ICD-10-CM | POA: Diagnosis not present

## 2018-10-19 DIAGNOSIS — N186 End stage renal disease: Secondary | ICD-10-CM | POA: Diagnosis not present

## 2018-10-21 DIAGNOSIS — N2581 Secondary hyperparathyroidism of renal origin: Secondary | ICD-10-CM | POA: Diagnosis not present

## 2018-10-21 DIAGNOSIS — N186 End stage renal disease: Secondary | ICD-10-CM | POA: Diagnosis not present

## 2018-10-21 DIAGNOSIS — E118 Type 2 diabetes mellitus with unspecified complications: Secondary | ICD-10-CM | POA: Diagnosis not present

## 2018-10-24 DIAGNOSIS — N2581 Secondary hyperparathyroidism of renal origin: Secondary | ICD-10-CM | POA: Diagnosis not present

## 2018-10-24 DIAGNOSIS — E118 Type 2 diabetes mellitus with unspecified complications: Secondary | ICD-10-CM | POA: Diagnosis not present

## 2018-10-24 DIAGNOSIS — N186 End stage renal disease: Secondary | ICD-10-CM | POA: Diagnosis not present

## 2018-10-26 DIAGNOSIS — N186 End stage renal disease: Secondary | ICD-10-CM | POA: Diagnosis not present

## 2018-10-26 DIAGNOSIS — N2581 Secondary hyperparathyroidism of renal origin: Secondary | ICD-10-CM | POA: Diagnosis not present

## 2018-10-26 DIAGNOSIS — E118 Type 2 diabetes mellitus with unspecified complications: Secondary | ICD-10-CM | POA: Diagnosis not present

## 2018-10-28 DIAGNOSIS — E118 Type 2 diabetes mellitus with unspecified complications: Secondary | ICD-10-CM | POA: Diagnosis not present

## 2018-10-28 DIAGNOSIS — N186 End stage renal disease: Secondary | ICD-10-CM | POA: Diagnosis not present

## 2018-10-28 DIAGNOSIS — N2581 Secondary hyperparathyroidism of renal origin: Secondary | ICD-10-CM | POA: Diagnosis not present

## 2018-10-30 DIAGNOSIS — N186 End stage renal disease: Secondary | ICD-10-CM | POA: Diagnosis not present

## 2018-10-30 DIAGNOSIS — N2581 Secondary hyperparathyroidism of renal origin: Secondary | ICD-10-CM | POA: Diagnosis not present

## 2018-10-30 DIAGNOSIS — E118 Type 2 diabetes mellitus with unspecified complications: Secondary | ICD-10-CM | POA: Diagnosis not present

## 2018-11-01 DIAGNOSIS — N2581 Secondary hyperparathyroidism of renal origin: Secondary | ICD-10-CM | POA: Diagnosis not present

## 2018-11-01 DIAGNOSIS — E118 Type 2 diabetes mellitus with unspecified complications: Secondary | ICD-10-CM | POA: Diagnosis not present

## 2018-11-01 DIAGNOSIS — N186 End stage renal disease: Secondary | ICD-10-CM | POA: Diagnosis not present

## 2018-11-04 DIAGNOSIS — N186 End stage renal disease: Secondary | ICD-10-CM | POA: Diagnosis not present

## 2018-11-04 DIAGNOSIS — E118 Type 2 diabetes mellitus with unspecified complications: Secondary | ICD-10-CM | POA: Diagnosis not present

## 2018-11-04 DIAGNOSIS — N2581 Secondary hyperparathyroidism of renal origin: Secondary | ICD-10-CM | POA: Diagnosis not present

## 2018-11-05 DIAGNOSIS — N186 End stage renal disease: Secondary | ICD-10-CM | POA: Diagnosis not present

## 2018-11-05 DIAGNOSIS — Z992 Dependence on renal dialysis: Secondary | ICD-10-CM | POA: Diagnosis not present

## 2018-11-05 DIAGNOSIS — N041 Nephrotic syndrome with focal and segmental glomerular lesions: Secondary | ICD-10-CM | POA: Diagnosis not present

## 2018-11-07 DIAGNOSIS — E118 Type 2 diabetes mellitus with unspecified complications: Secondary | ICD-10-CM | POA: Diagnosis not present

## 2018-11-07 DIAGNOSIS — N2581 Secondary hyperparathyroidism of renal origin: Secondary | ICD-10-CM | POA: Diagnosis not present

## 2018-11-07 DIAGNOSIS — N186 End stage renal disease: Secondary | ICD-10-CM | POA: Diagnosis not present

## 2018-11-09 DIAGNOSIS — N2581 Secondary hyperparathyroidism of renal origin: Secondary | ICD-10-CM | POA: Diagnosis not present

## 2018-11-09 DIAGNOSIS — E118 Type 2 diabetes mellitus with unspecified complications: Secondary | ICD-10-CM | POA: Diagnosis not present

## 2018-11-09 DIAGNOSIS — N186 End stage renal disease: Secondary | ICD-10-CM | POA: Diagnosis not present

## 2018-11-11 DIAGNOSIS — N2581 Secondary hyperparathyroidism of renal origin: Secondary | ICD-10-CM | POA: Diagnosis not present

## 2018-11-11 DIAGNOSIS — E118 Type 2 diabetes mellitus with unspecified complications: Secondary | ICD-10-CM | POA: Diagnosis not present

## 2018-11-11 DIAGNOSIS — N186 End stage renal disease: Secondary | ICD-10-CM | POA: Diagnosis not present

## 2018-11-14 DIAGNOSIS — E118 Type 2 diabetes mellitus with unspecified complications: Secondary | ICD-10-CM | POA: Diagnosis not present

## 2018-11-14 DIAGNOSIS — N186 End stage renal disease: Secondary | ICD-10-CM | POA: Diagnosis not present

## 2018-11-14 DIAGNOSIS — N2581 Secondary hyperparathyroidism of renal origin: Secondary | ICD-10-CM | POA: Diagnosis not present

## 2018-11-16 DIAGNOSIS — N2581 Secondary hyperparathyroidism of renal origin: Secondary | ICD-10-CM | POA: Diagnosis not present

## 2018-11-16 DIAGNOSIS — E118 Type 2 diabetes mellitus with unspecified complications: Secondary | ICD-10-CM | POA: Diagnosis not present

## 2018-11-16 DIAGNOSIS — N186 End stage renal disease: Secondary | ICD-10-CM | POA: Diagnosis not present

## 2018-11-18 DIAGNOSIS — E118 Type 2 diabetes mellitus with unspecified complications: Secondary | ICD-10-CM | POA: Diagnosis not present

## 2018-11-18 DIAGNOSIS — N186 End stage renal disease: Secondary | ICD-10-CM | POA: Diagnosis not present

## 2018-11-18 DIAGNOSIS — N2581 Secondary hyperparathyroidism of renal origin: Secondary | ICD-10-CM | POA: Diagnosis not present

## 2018-11-21 DIAGNOSIS — N2581 Secondary hyperparathyroidism of renal origin: Secondary | ICD-10-CM | POA: Diagnosis not present

## 2018-11-21 DIAGNOSIS — E118 Type 2 diabetes mellitus with unspecified complications: Secondary | ICD-10-CM | POA: Diagnosis not present

## 2018-11-21 DIAGNOSIS — N186 End stage renal disease: Secondary | ICD-10-CM | POA: Diagnosis not present

## 2018-11-25 DIAGNOSIS — N2581 Secondary hyperparathyroidism of renal origin: Secondary | ICD-10-CM | POA: Diagnosis not present

## 2018-11-25 DIAGNOSIS — E118 Type 2 diabetes mellitus with unspecified complications: Secondary | ICD-10-CM | POA: Diagnosis not present

## 2018-11-25 DIAGNOSIS — N186 End stage renal disease: Secondary | ICD-10-CM | POA: Diagnosis not present

## 2018-11-27 DIAGNOSIS — N2581 Secondary hyperparathyroidism of renal origin: Secondary | ICD-10-CM | POA: Diagnosis not present

## 2018-11-27 DIAGNOSIS — N186 End stage renal disease: Secondary | ICD-10-CM | POA: Diagnosis not present

## 2018-11-27 DIAGNOSIS — E118 Type 2 diabetes mellitus with unspecified complications: Secondary | ICD-10-CM | POA: Diagnosis not present

## 2018-11-30 DIAGNOSIS — E118 Type 2 diabetes mellitus with unspecified complications: Secondary | ICD-10-CM | POA: Diagnosis not present

## 2018-11-30 DIAGNOSIS — N2581 Secondary hyperparathyroidism of renal origin: Secondary | ICD-10-CM | POA: Diagnosis not present

## 2018-11-30 DIAGNOSIS — N186 End stage renal disease: Secondary | ICD-10-CM | POA: Diagnosis not present

## 2018-12-02 DIAGNOSIS — N2581 Secondary hyperparathyroidism of renal origin: Secondary | ICD-10-CM | POA: Diagnosis not present

## 2018-12-02 DIAGNOSIS — N186 End stage renal disease: Secondary | ICD-10-CM | POA: Diagnosis not present

## 2018-12-02 DIAGNOSIS — E118 Type 2 diabetes mellitus with unspecified complications: Secondary | ICD-10-CM | POA: Diagnosis not present

## 2018-12-04 DIAGNOSIS — N2581 Secondary hyperparathyroidism of renal origin: Secondary | ICD-10-CM | POA: Diagnosis not present

## 2018-12-04 DIAGNOSIS — N186 End stage renal disease: Secondary | ICD-10-CM | POA: Diagnosis not present

## 2018-12-04 DIAGNOSIS — E118 Type 2 diabetes mellitus with unspecified complications: Secondary | ICD-10-CM | POA: Diagnosis not present

## 2018-12-06 DIAGNOSIS — N041 Nephrotic syndrome with focal and segmental glomerular lesions: Secondary | ICD-10-CM | POA: Diagnosis not present

## 2018-12-06 DIAGNOSIS — Z992 Dependence on renal dialysis: Secondary | ICD-10-CM | POA: Diagnosis not present

## 2018-12-06 DIAGNOSIS — N186 End stage renal disease: Secondary | ICD-10-CM | POA: Diagnosis not present

## 2018-12-07 DIAGNOSIS — N186 End stage renal disease: Secondary | ICD-10-CM | POA: Diagnosis not present

## 2018-12-07 DIAGNOSIS — N2581 Secondary hyperparathyroidism of renal origin: Secondary | ICD-10-CM | POA: Diagnosis not present

## 2018-12-07 DIAGNOSIS — E118 Type 2 diabetes mellitus with unspecified complications: Secondary | ICD-10-CM | POA: Diagnosis not present

## 2018-12-09 DIAGNOSIS — N186 End stage renal disease: Secondary | ICD-10-CM | POA: Diagnosis not present

## 2018-12-09 DIAGNOSIS — N2581 Secondary hyperparathyroidism of renal origin: Secondary | ICD-10-CM | POA: Diagnosis not present

## 2018-12-09 DIAGNOSIS — E118 Type 2 diabetes mellitus with unspecified complications: Secondary | ICD-10-CM | POA: Diagnosis not present

## 2018-12-12 DIAGNOSIS — N2581 Secondary hyperparathyroidism of renal origin: Secondary | ICD-10-CM | POA: Diagnosis not present

## 2018-12-12 DIAGNOSIS — E118 Type 2 diabetes mellitus with unspecified complications: Secondary | ICD-10-CM | POA: Diagnosis not present

## 2018-12-12 DIAGNOSIS — N186 End stage renal disease: Secondary | ICD-10-CM | POA: Diagnosis not present

## 2018-12-14 DIAGNOSIS — N2581 Secondary hyperparathyroidism of renal origin: Secondary | ICD-10-CM | POA: Diagnosis not present

## 2018-12-14 DIAGNOSIS — E118 Type 2 diabetes mellitus with unspecified complications: Secondary | ICD-10-CM | POA: Diagnosis not present

## 2018-12-14 DIAGNOSIS — N186 End stage renal disease: Secondary | ICD-10-CM | POA: Diagnosis not present

## 2018-12-16 DIAGNOSIS — N186 End stage renal disease: Secondary | ICD-10-CM | POA: Diagnosis not present

## 2018-12-16 DIAGNOSIS — E118 Type 2 diabetes mellitus with unspecified complications: Secondary | ICD-10-CM | POA: Diagnosis not present

## 2018-12-16 DIAGNOSIS — N2581 Secondary hyperparathyroidism of renal origin: Secondary | ICD-10-CM | POA: Diagnosis not present

## 2018-12-19 DIAGNOSIS — E118 Type 2 diabetes mellitus with unspecified complications: Secondary | ICD-10-CM | POA: Diagnosis not present

## 2018-12-19 DIAGNOSIS — N2581 Secondary hyperparathyroidism of renal origin: Secondary | ICD-10-CM | POA: Diagnosis not present

## 2018-12-19 DIAGNOSIS — N186 End stage renal disease: Secondary | ICD-10-CM | POA: Diagnosis not present

## 2018-12-21 DIAGNOSIS — E118 Type 2 diabetes mellitus with unspecified complications: Secondary | ICD-10-CM | POA: Diagnosis not present

## 2018-12-21 DIAGNOSIS — N2581 Secondary hyperparathyroidism of renal origin: Secondary | ICD-10-CM | POA: Diagnosis not present

## 2018-12-21 DIAGNOSIS — N186 End stage renal disease: Secondary | ICD-10-CM | POA: Diagnosis not present

## 2018-12-23 DIAGNOSIS — E118 Type 2 diabetes mellitus with unspecified complications: Secondary | ICD-10-CM | POA: Diagnosis not present

## 2018-12-23 DIAGNOSIS — N186 End stage renal disease: Secondary | ICD-10-CM | POA: Diagnosis not present

## 2018-12-23 DIAGNOSIS — N2581 Secondary hyperparathyroidism of renal origin: Secondary | ICD-10-CM | POA: Diagnosis not present

## 2018-12-26 DIAGNOSIS — N186 End stage renal disease: Secondary | ICD-10-CM | POA: Diagnosis not present

## 2018-12-26 DIAGNOSIS — E118 Type 2 diabetes mellitus with unspecified complications: Secondary | ICD-10-CM | POA: Diagnosis not present

## 2018-12-26 DIAGNOSIS — N2581 Secondary hyperparathyroidism of renal origin: Secondary | ICD-10-CM | POA: Diagnosis not present

## 2018-12-28 DIAGNOSIS — N186 End stage renal disease: Secondary | ICD-10-CM | POA: Diagnosis not present

## 2018-12-28 DIAGNOSIS — E118 Type 2 diabetes mellitus with unspecified complications: Secondary | ICD-10-CM | POA: Diagnosis not present

## 2018-12-28 DIAGNOSIS — N2581 Secondary hyperparathyroidism of renal origin: Secondary | ICD-10-CM | POA: Diagnosis not present

## 2018-12-30 DIAGNOSIS — N186 End stage renal disease: Secondary | ICD-10-CM | POA: Diagnosis not present

## 2018-12-30 DIAGNOSIS — E118 Type 2 diabetes mellitus with unspecified complications: Secondary | ICD-10-CM | POA: Diagnosis not present

## 2018-12-30 DIAGNOSIS — N2581 Secondary hyperparathyroidism of renal origin: Secondary | ICD-10-CM | POA: Diagnosis not present

## 2019-01-02 DIAGNOSIS — N2581 Secondary hyperparathyroidism of renal origin: Secondary | ICD-10-CM | POA: Diagnosis not present

## 2019-01-02 DIAGNOSIS — E118 Type 2 diabetes mellitus with unspecified complications: Secondary | ICD-10-CM | POA: Diagnosis not present

## 2019-01-02 DIAGNOSIS — N186 End stage renal disease: Secondary | ICD-10-CM | POA: Diagnosis not present

## 2019-01-04 DIAGNOSIS — N2581 Secondary hyperparathyroidism of renal origin: Secondary | ICD-10-CM | POA: Diagnosis not present

## 2019-01-04 DIAGNOSIS — N186 End stage renal disease: Secondary | ICD-10-CM | POA: Diagnosis not present

## 2019-01-04 DIAGNOSIS — E118 Type 2 diabetes mellitus with unspecified complications: Secondary | ICD-10-CM | POA: Diagnosis not present

## 2019-01-06 DIAGNOSIS — Z992 Dependence on renal dialysis: Secondary | ICD-10-CM | POA: Diagnosis not present

## 2019-01-06 DIAGNOSIS — N041 Nephrotic syndrome with focal and segmental glomerular lesions: Secondary | ICD-10-CM | POA: Diagnosis not present

## 2019-01-06 DIAGNOSIS — N186 End stage renal disease: Secondary | ICD-10-CM | POA: Diagnosis not present

## 2019-01-09 DIAGNOSIS — N186 End stage renal disease: Secondary | ICD-10-CM | POA: Diagnosis not present

## 2019-01-09 DIAGNOSIS — N2581 Secondary hyperparathyroidism of renal origin: Secondary | ICD-10-CM | POA: Diagnosis not present

## 2019-01-09 DIAGNOSIS — E118 Type 2 diabetes mellitus with unspecified complications: Secondary | ICD-10-CM | POA: Diagnosis not present

## 2019-01-10 ENCOUNTER — Other Ambulatory Visit: Payer: Self-pay | Admitting: Family Medicine

## 2019-01-10 DIAGNOSIS — E118 Type 2 diabetes mellitus with unspecified complications: Secondary | ICD-10-CM

## 2019-01-10 DIAGNOSIS — Z794 Long term (current) use of insulin: Principal | ICD-10-CM

## 2019-01-11 DIAGNOSIS — N186 End stage renal disease: Secondary | ICD-10-CM | POA: Diagnosis not present

## 2019-01-11 DIAGNOSIS — E118 Type 2 diabetes mellitus with unspecified complications: Secondary | ICD-10-CM | POA: Diagnosis not present

## 2019-01-11 DIAGNOSIS — N2581 Secondary hyperparathyroidism of renal origin: Secondary | ICD-10-CM | POA: Diagnosis not present

## 2019-01-12 ENCOUNTER — Other Ambulatory Visit: Payer: Self-pay | Admitting: Family Medicine

## 2019-01-12 DIAGNOSIS — E118 Type 2 diabetes mellitus with unspecified complications: Secondary | ICD-10-CM

## 2019-01-12 DIAGNOSIS — Z794 Long term (current) use of insulin: Principal | ICD-10-CM

## 2019-01-13 DIAGNOSIS — N186 End stage renal disease: Secondary | ICD-10-CM | POA: Diagnosis not present

## 2019-01-13 DIAGNOSIS — E118 Type 2 diabetes mellitus with unspecified complications: Secondary | ICD-10-CM | POA: Diagnosis not present

## 2019-01-13 DIAGNOSIS — N2581 Secondary hyperparathyroidism of renal origin: Secondary | ICD-10-CM | POA: Diagnosis not present

## 2019-01-16 DIAGNOSIS — E118 Type 2 diabetes mellitus with unspecified complications: Secondary | ICD-10-CM | POA: Diagnosis not present

## 2019-01-16 DIAGNOSIS — N186 End stage renal disease: Secondary | ICD-10-CM | POA: Diagnosis not present

## 2019-01-16 DIAGNOSIS — N2581 Secondary hyperparathyroidism of renal origin: Secondary | ICD-10-CM | POA: Diagnosis not present

## 2019-01-18 DIAGNOSIS — N186 End stage renal disease: Secondary | ICD-10-CM | POA: Diagnosis not present

## 2019-01-18 DIAGNOSIS — N2581 Secondary hyperparathyroidism of renal origin: Secondary | ICD-10-CM | POA: Diagnosis not present

## 2019-01-18 DIAGNOSIS — E118 Type 2 diabetes mellitus with unspecified complications: Secondary | ICD-10-CM | POA: Diagnosis not present

## 2019-01-20 DIAGNOSIS — N2581 Secondary hyperparathyroidism of renal origin: Secondary | ICD-10-CM | POA: Diagnosis not present

## 2019-01-20 DIAGNOSIS — E118 Type 2 diabetes mellitus with unspecified complications: Secondary | ICD-10-CM | POA: Diagnosis not present

## 2019-01-20 DIAGNOSIS — N186 End stage renal disease: Secondary | ICD-10-CM | POA: Diagnosis not present

## 2019-01-23 DIAGNOSIS — N186 End stage renal disease: Secondary | ICD-10-CM | POA: Diagnosis not present

## 2019-01-23 DIAGNOSIS — N2581 Secondary hyperparathyroidism of renal origin: Secondary | ICD-10-CM | POA: Diagnosis not present

## 2019-01-23 DIAGNOSIS — E118 Type 2 diabetes mellitus with unspecified complications: Secondary | ICD-10-CM | POA: Diagnosis not present

## 2019-01-25 DIAGNOSIS — N186 End stage renal disease: Secondary | ICD-10-CM | POA: Diagnosis not present

## 2019-01-25 DIAGNOSIS — E118 Type 2 diabetes mellitus with unspecified complications: Secondary | ICD-10-CM | POA: Diagnosis not present

## 2019-01-25 DIAGNOSIS — N2581 Secondary hyperparathyroidism of renal origin: Secondary | ICD-10-CM | POA: Diagnosis not present

## 2019-01-27 DIAGNOSIS — N2581 Secondary hyperparathyroidism of renal origin: Secondary | ICD-10-CM | POA: Diagnosis not present

## 2019-01-27 DIAGNOSIS — E118 Type 2 diabetes mellitus with unspecified complications: Secondary | ICD-10-CM | POA: Diagnosis not present

## 2019-01-27 DIAGNOSIS — N186 End stage renal disease: Secondary | ICD-10-CM | POA: Diagnosis not present

## 2019-01-30 DIAGNOSIS — N186 End stage renal disease: Secondary | ICD-10-CM | POA: Diagnosis not present

## 2019-01-30 DIAGNOSIS — E118 Type 2 diabetes mellitus with unspecified complications: Secondary | ICD-10-CM | POA: Diagnosis not present

## 2019-01-30 DIAGNOSIS — N2581 Secondary hyperparathyroidism of renal origin: Secondary | ICD-10-CM | POA: Diagnosis not present

## 2019-02-01 DIAGNOSIS — E118 Type 2 diabetes mellitus with unspecified complications: Secondary | ICD-10-CM | POA: Diagnosis not present

## 2019-02-01 DIAGNOSIS — N186 End stage renal disease: Secondary | ICD-10-CM | POA: Diagnosis not present

## 2019-02-01 DIAGNOSIS — N2581 Secondary hyperparathyroidism of renal origin: Secondary | ICD-10-CM | POA: Diagnosis not present

## 2019-02-03 DIAGNOSIS — E118 Type 2 diabetes mellitus with unspecified complications: Secondary | ICD-10-CM | POA: Diagnosis not present

## 2019-02-03 DIAGNOSIS — N186 End stage renal disease: Secondary | ICD-10-CM | POA: Diagnosis not present

## 2019-02-03 DIAGNOSIS — N2581 Secondary hyperparathyroidism of renal origin: Secondary | ICD-10-CM | POA: Diagnosis not present

## 2019-02-04 DIAGNOSIS — N041 Nephrotic syndrome with focal and segmental glomerular lesions: Secondary | ICD-10-CM | POA: Diagnosis not present

## 2019-02-04 DIAGNOSIS — Z992 Dependence on renal dialysis: Secondary | ICD-10-CM | POA: Diagnosis not present

## 2019-02-04 DIAGNOSIS — N186 End stage renal disease: Secondary | ICD-10-CM | POA: Diagnosis not present

## 2019-02-06 DIAGNOSIS — N186 End stage renal disease: Secondary | ICD-10-CM | POA: Diagnosis not present

## 2019-02-06 DIAGNOSIS — E118 Type 2 diabetes mellitus with unspecified complications: Secondary | ICD-10-CM | POA: Diagnosis not present

## 2019-02-06 DIAGNOSIS — D509 Iron deficiency anemia, unspecified: Secondary | ICD-10-CM | POA: Diagnosis not present

## 2019-02-06 DIAGNOSIS — N2581 Secondary hyperparathyroidism of renal origin: Secondary | ICD-10-CM | POA: Diagnosis not present

## 2019-02-08 DIAGNOSIS — D509 Iron deficiency anemia, unspecified: Secondary | ICD-10-CM | POA: Diagnosis not present

## 2019-02-08 DIAGNOSIS — N2581 Secondary hyperparathyroidism of renal origin: Secondary | ICD-10-CM | POA: Diagnosis not present

## 2019-02-08 DIAGNOSIS — E118 Type 2 diabetes mellitus with unspecified complications: Secondary | ICD-10-CM | POA: Diagnosis not present

## 2019-02-08 DIAGNOSIS — N186 End stage renal disease: Secondary | ICD-10-CM | POA: Diagnosis not present

## 2019-02-10 DIAGNOSIS — N186 End stage renal disease: Secondary | ICD-10-CM | POA: Diagnosis not present

## 2019-02-10 DIAGNOSIS — D509 Iron deficiency anemia, unspecified: Secondary | ICD-10-CM | POA: Diagnosis not present

## 2019-02-10 DIAGNOSIS — N2581 Secondary hyperparathyroidism of renal origin: Secondary | ICD-10-CM | POA: Diagnosis not present

## 2019-02-10 DIAGNOSIS — E118 Type 2 diabetes mellitus with unspecified complications: Secondary | ICD-10-CM | POA: Diagnosis not present

## 2019-02-13 DIAGNOSIS — D509 Iron deficiency anemia, unspecified: Secondary | ICD-10-CM | POA: Diagnosis not present

## 2019-02-13 DIAGNOSIS — N186 End stage renal disease: Secondary | ICD-10-CM | POA: Diagnosis not present

## 2019-02-13 DIAGNOSIS — E118 Type 2 diabetes mellitus with unspecified complications: Secondary | ICD-10-CM | POA: Diagnosis not present

## 2019-02-13 DIAGNOSIS — N2581 Secondary hyperparathyroidism of renal origin: Secondary | ICD-10-CM | POA: Diagnosis not present

## 2019-02-15 DIAGNOSIS — E118 Type 2 diabetes mellitus with unspecified complications: Secondary | ICD-10-CM | POA: Diagnosis not present

## 2019-02-15 DIAGNOSIS — N2581 Secondary hyperparathyroidism of renal origin: Secondary | ICD-10-CM | POA: Diagnosis not present

## 2019-02-15 DIAGNOSIS — D509 Iron deficiency anemia, unspecified: Secondary | ICD-10-CM | POA: Diagnosis not present

## 2019-02-15 DIAGNOSIS — N186 End stage renal disease: Secondary | ICD-10-CM | POA: Diagnosis not present

## 2019-02-17 DIAGNOSIS — E118 Type 2 diabetes mellitus with unspecified complications: Secondary | ICD-10-CM | POA: Diagnosis not present

## 2019-02-17 DIAGNOSIS — D509 Iron deficiency anemia, unspecified: Secondary | ICD-10-CM | POA: Diagnosis not present

## 2019-02-17 DIAGNOSIS — N2581 Secondary hyperparathyroidism of renal origin: Secondary | ICD-10-CM | POA: Diagnosis not present

## 2019-02-17 DIAGNOSIS — N186 End stage renal disease: Secondary | ICD-10-CM | POA: Diagnosis not present

## 2019-02-20 DIAGNOSIS — D509 Iron deficiency anemia, unspecified: Secondary | ICD-10-CM | POA: Diagnosis not present

## 2019-02-20 DIAGNOSIS — N2581 Secondary hyperparathyroidism of renal origin: Secondary | ICD-10-CM | POA: Diagnosis not present

## 2019-02-20 DIAGNOSIS — E118 Type 2 diabetes mellitus with unspecified complications: Secondary | ICD-10-CM | POA: Diagnosis not present

## 2019-02-20 DIAGNOSIS — N186 End stage renal disease: Secondary | ICD-10-CM | POA: Diagnosis not present

## 2019-02-22 DIAGNOSIS — N186 End stage renal disease: Secondary | ICD-10-CM | POA: Diagnosis not present

## 2019-02-22 DIAGNOSIS — D509 Iron deficiency anemia, unspecified: Secondary | ICD-10-CM | POA: Diagnosis not present

## 2019-02-22 DIAGNOSIS — E118 Type 2 diabetes mellitus with unspecified complications: Secondary | ICD-10-CM | POA: Diagnosis not present

## 2019-02-22 DIAGNOSIS — N2581 Secondary hyperparathyroidism of renal origin: Secondary | ICD-10-CM | POA: Diagnosis not present

## 2019-02-24 DIAGNOSIS — N2581 Secondary hyperparathyroidism of renal origin: Secondary | ICD-10-CM | POA: Diagnosis not present

## 2019-02-24 DIAGNOSIS — N186 End stage renal disease: Secondary | ICD-10-CM | POA: Diagnosis not present

## 2019-02-24 DIAGNOSIS — E118 Type 2 diabetes mellitus with unspecified complications: Secondary | ICD-10-CM | POA: Diagnosis not present

## 2019-02-24 DIAGNOSIS — D509 Iron deficiency anemia, unspecified: Secondary | ICD-10-CM | POA: Diagnosis not present

## 2019-02-27 DIAGNOSIS — D509 Iron deficiency anemia, unspecified: Secondary | ICD-10-CM | POA: Diagnosis not present

## 2019-02-27 DIAGNOSIS — E118 Type 2 diabetes mellitus with unspecified complications: Secondary | ICD-10-CM | POA: Diagnosis not present

## 2019-02-27 DIAGNOSIS — N186 End stage renal disease: Secondary | ICD-10-CM | POA: Diagnosis not present

## 2019-02-27 DIAGNOSIS — N2581 Secondary hyperparathyroidism of renal origin: Secondary | ICD-10-CM | POA: Diagnosis not present

## 2019-03-01 DIAGNOSIS — E118 Type 2 diabetes mellitus with unspecified complications: Secondary | ICD-10-CM | POA: Diagnosis not present

## 2019-03-01 DIAGNOSIS — D509 Iron deficiency anemia, unspecified: Secondary | ICD-10-CM | POA: Diagnosis not present

## 2019-03-01 DIAGNOSIS — N2581 Secondary hyperparathyroidism of renal origin: Secondary | ICD-10-CM | POA: Diagnosis not present

## 2019-03-01 DIAGNOSIS — N186 End stage renal disease: Secondary | ICD-10-CM | POA: Diagnosis not present

## 2019-03-03 DIAGNOSIS — E118 Type 2 diabetes mellitus with unspecified complications: Secondary | ICD-10-CM | POA: Diagnosis not present

## 2019-03-03 DIAGNOSIS — N2581 Secondary hyperparathyroidism of renal origin: Secondary | ICD-10-CM | POA: Diagnosis not present

## 2019-03-03 DIAGNOSIS — D509 Iron deficiency anemia, unspecified: Secondary | ICD-10-CM | POA: Diagnosis not present

## 2019-03-03 DIAGNOSIS — N186 End stage renal disease: Secondary | ICD-10-CM | POA: Diagnosis not present

## 2019-03-06 DIAGNOSIS — D509 Iron deficiency anemia, unspecified: Secondary | ICD-10-CM | POA: Diagnosis not present

## 2019-03-06 DIAGNOSIS — N186 End stage renal disease: Secondary | ICD-10-CM | POA: Diagnosis not present

## 2019-03-06 DIAGNOSIS — N2581 Secondary hyperparathyroidism of renal origin: Secondary | ICD-10-CM | POA: Diagnosis not present

## 2019-03-06 DIAGNOSIS — E118 Type 2 diabetes mellitus with unspecified complications: Secondary | ICD-10-CM | POA: Diagnosis not present

## 2019-03-07 DIAGNOSIS — N041 Nephrotic syndrome with focal and segmental glomerular lesions: Secondary | ICD-10-CM | POA: Diagnosis not present

## 2019-03-07 DIAGNOSIS — N186 End stage renal disease: Secondary | ICD-10-CM | POA: Diagnosis not present

## 2019-03-07 DIAGNOSIS — Z992 Dependence on renal dialysis: Secondary | ICD-10-CM | POA: Diagnosis not present

## 2019-03-08 DIAGNOSIS — D509 Iron deficiency anemia, unspecified: Secondary | ICD-10-CM | POA: Diagnosis not present

## 2019-03-08 DIAGNOSIS — N2581 Secondary hyperparathyroidism of renal origin: Secondary | ICD-10-CM | POA: Diagnosis not present

## 2019-03-08 DIAGNOSIS — N186 End stage renal disease: Secondary | ICD-10-CM | POA: Diagnosis not present

## 2019-03-08 DIAGNOSIS — E118 Type 2 diabetes mellitus with unspecified complications: Secondary | ICD-10-CM | POA: Diagnosis not present

## 2019-03-08 DIAGNOSIS — D631 Anemia in chronic kidney disease: Secondary | ICD-10-CM | POA: Diagnosis not present

## 2019-03-10 DIAGNOSIS — D509 Iron deficiency anemia, unspecified: Secondary | ICD-10-CM | POA: Diagnosis not present

## 2019-03-10 DIAGNOSIS — D631 Anemia in chronic kidney disease: Secondary | ICD-10-CM | POA: Diagnosis not present

## 2019-03-10 DIAGNOSIS — N186 End stage renal disease: Secondary | ICD-10-CM | POA: Diagnosis not present

## 2019-03-10 DIAGNOSIS — N2581 Secondary hyperparathyroidism of renal origin: Secondary | ICD-10-CM | POA: Diagnosis not present

## 2019-03-10 DIAGNOSIS — E118 Type 2 diabetes mellitus with unspecified complications: Secondary | ICD-10-CM | POA: Diagnosis not present

## 2019-03-13 DIAGNOSIS — N2581 Secondary hyperparathyroidism of renal origin: Secondary | ICD-10-CM | POA: Diagnosis not present

## 2019-03-13 DIAGNOSIS — D631 Anemia in chronic kidney disease: Secondary | ICD-10-CM | POA: Diagnosis not present

## 2019-03-13 DIAGNOSIS — N186 End stage renal disease: Secondary | ICD-10-CM | POA: Diagnosis not present

## 2019-03-13 DIAGNOSIS — D509 Iron deficiency anemia, unspecified: Secondary | ICD-10-CM | POA: Diagnosis not present

## 2019-03-13 DIAGNOSIS — E118 Type 2 diabetes mellitus with unspecified complications: Secondary | ICD-10-CM | POA: Diagnosis not present

## 2019-03-15 DIAGNOSIS — D509 Iron deficiency anemia, unspecified: Secondary | ICD-10-CM | POA: Diagnosis not present

## 2019-03-15 DIAGNOSIS — D631 Anemia in chronic kidney disease: Secondary | ICD-10-CM | POA: Diagnosis not present

## 2019-03-15 DIAGNOSIS — N2581 Secondary hyperparathyroidism of renal origin: Secondary | ICD-10-CM | POA: Diagnosis not present

## 2019-03-15 DIAGNOSIS — E118 Type 2 diabetes mellitus with unspecified complications: Secondary | ICD-10-CM | POA: Diagnosis not present

## 2019-03-15 DIAGNOSIS — N186 End stage renal disease: Secondary | ICD-10-CM | POA: Diagnosis not present

## 2019-03-17 DIAGNOSIS — E118 Type 2 diabetes mellitus with unspecified complications: Secondary | ICD-10-CM | POA: Diagnosis not present

## 2019-03-17 DIAGNOSIS — D509 Iron deficiency anemia, unspecified: Secondary | ICD-10-CM | POA: Diagnosis not present

## 2019-03-17 DIAGNOSIS — N186 End stage renal disease: Secondary | ICD-10-CM | POA: Diagnosis not present

## 2019-03-17 DIAGNOSIS — N2581 Secondary hyperparathyroidism of renal origin: Secondary | ICD-10-CM | POA: Diagnosis not present

## 2019-03-17 DIAGNOSIS — D631 Anemia in chronic kidney disease: Secondary | ICD-10-CM | POA: Diagnosis not present

## 2019-03-20 DIAGNOSIS — D631 Anemia in chronic kidney disease: Secondary | ICD-10-CM | POA: Diagnosis not present

## 2019-03-20 DIAGNOSIS — N2581 Secondary hyperparathyroidism of renal origin: Secondary | ICD-10-CM | POA: Diagnosis not present

## 2019-03-20 DIAGNOSIS — D509 Iron deficiency anemia, unspecified: Secondary | ICD-10-CM | POA: Diagnosis not present

## 2019-03-20 DIAGNOSIS — E118 Type 2 diabetes mellitus with unspecified complications: Secondary | ICD-10-CM | POA: Diagnosis not present

## 2019-03-20 DIAGNOSIS — N186 End stage renal disease: Secondary | ICD-10-CM | POA: Diagnosis not present

## 2019-03-22 DIAGNOSIS — D509 Iron deficiency anemia, unspecified: Secondary | ICD-10-CM | POA: Diagnosis not present

## 2019-03-22 DIAGNOSIS — N186 End stage renal disease: Secondary | ICD-10-CM | POA: Diagnosis not present

## 2019-03-22 DIAGNOSIS — N2581 Secondary hyperparathyroidism of renal origin: Secondary | ICD-10-CM | POA: Diagnosis not present

## 2019-03-22 DIAGNOSIS — E118 Type 2 diabetes mellitus with unspecified complications: Secondary | ICD-10-CM | POA: Diagnosis not present

## 2019-03-22 DIAGNOSIS — D631 Anemia in chronic kidney disease: Secondary | ICD-10-CM | POA: Diagnosis not present

## 2019-03-24 DIAGNOSIS — N2581 Secondary hyperparathyroidism of renal origin: Secondary | ICD-10-CM | POA: Diagnosis not present

## 2019-03-24 DIAGNOSIS — D631 Anemia in chronic kidney disease: Secondary | ICD-10-CM | POA: Diagnosis not present

## 2019-03-24 DIAGNOSIS — N186 End stage renal disease: Secondary | ICD-10-CM | POA: Diagnosis not present

## 2019-03-24 DIAGNOSIS — D509 Iron deficiency anemia, unspecified: Secondary | ICD-10-CM | POA: Diagnosis not present

## 2019-03-24 DIAGNOSIS — E118 Type 2 diabetes mellitus with unspecified complications: Secondary | ICD-10-CM | POA: Diagnosis not present

## 2019-03-27 DIAGNOSIS — N2581 Secondary hyperparathyroidism of renal origin: Secondary | ICD-10-CM | POA: Diagnosis not present

## 2019-03-27 DIAGNOSIS — N186 End stage renal disease: Secondary | ICD-10-CM | POA: Diagnosis not present

## 2019-03-27 DIAGNOSIS — D509 Iron deficiency anemia, unspecified: Secondary | ICD-10-CM | POA: Diagnosis not present

## 2019-03-27 DIAGNOSIS — E118 Type 2 diabetes mellitus with unspecified complications: Secondary | ICD-10-CM | POA: Diagnosis not present

## 2019-03-27 DIAGNOSIS — D631 Anemia in chronic kidney disease: Secondary | ICD-10-CM | POA: Diagnosis not present

## 2019-03-29 DIAGNOSIS — N186 End stage renal disease: Secondary | ICD-10-CM | POA: Diagnosis not present

## 2019-03-29 DIAGNOSIS — N2581 Secondary hyperparathyroidism of renal origin: Secondary | ICD-10-CM | POA: Diagnosis not present

## 2019-03-29 DIAGNOSIS — D631 Anemia in chronic kidney disease: Secondary | ICD-10-CM | POA: Diagnosis not present

## 2019-03-29 DIAGNOSIS — E118 Type 2 diabetes mellitus with unspecified complications: Secondary | ICD-10-CM | POA: Diagnosis not present

## 2019-03-29 DIAGNOSIS — D509 Iron deficiency anemia, unspecified: Secondary | ICD-10-CM | POA: Diagnosis not present

## 2019-03-31 DIAGNOSIS — D631 Anemia in chronic kidney disease: Secondary | ICD-10-CM | POA: Diagnosis not present

## 2019-03-31 DIAGNOSIS — N2581 Secondary hyperparathyroidism of renal origin: Secondary | ICD-10-CM | POA: Diagnosis not present

## 2019-03-31 DIAGNOSIS — D509 Iron deficiency anemia, unspecified: Secondary | ICD-10-CM | POA: Diagnosis not present

## 2019-03-31 DIAGNOSIS — N186 End stage renal disease: Secondary | ICD-10-CM | POA: Diagnosis not present

## 2019-03-31 DIAGNOSIS — E118 Type 2 diabetes mellitus with unspecified complications: Secondary | ICD-10-CM | POA: Diagnosis not present

## 2019-04-03 DIAGNOSIS — N2581 Secondary hyperparathyroidism of renal origin: Secondary | ICD-10-CM | POA: Diagnosis not present

## 2019-04-03 DIAGNOSIS — N186 End stage renal disease: Secondary | ICD-10-CM | POA: Diagnosis not present

## 2019-04-03 DIAGNOSIS — E118 Type 2 diabetes mellitus with unspecified complications: Secondary | ICD-10-CM | POA: Diagnosis not present

## 2019-04-03 DIAGNOSIS — D509 Iron deficiency anemia, unspecified: Secondary | ICD-10-CM | POA: Diagnosis not present

## 2019-04-03 DIAGNOSIS — D631 Anemia in chronic kidney disease: Secondary | ICD-10-CM | POA: Diagnosis not present

## 2019-04-05 DIAGNOSIS — N186 End stage renal disease: Secondary | ICD-10-CM | POA: Diagnosis not present

## 2019-04-05 DIAGNOSIS — D631 Anemia in chronic kidney disease: Secondary | ICD-10-CM | POA: Diagnosis not present

## 2019-04-05 DIAGNOSIS — N2581 Secondary hyperparathyroidism of renal origin: Secondary | ICD-10-CM | POA: Diagnosis not present

## 2019-04-05 DIAGNOSIS — E118 Type 2 diabetes mellitus with unspecified complications: Secondary | ICD-10-CM | POA: Diagnosis not present

## 2019-04-05 DIAGNOSIS — D509 Iron deficiency anemia, unspecified: Secondary | ICD-10-CM | POA: Diagnosis not present

## 2019-04-06 DIAGNOSIS — N041 Nephrotic syndrome with focal and segmental glomerular lesions: Secondary | ICD-10-CM | POA: Diagnosis not present

## 2019-04-06 DIAGNOSIS — N186 End stage renal disease: Secondary | ICD-10-CM | POA: Diagnosis not present

## 2019-04-06 DIAGNOSIS — Z992 Dependence on renal dialysis: Secondary | ICD-10-CM | POA: Diagnosis not present

## 2019-04-07 DIAGNOSIS — D509 Iron deficiency anemia, unspecified: Secondary | ICD-10-CM | POA: Diagnosis not present

## 2019-04-07 DIAGNOSIS — N2581 Secondary hyperparathyroidism of renal origin: Secondary | ICD-10-CM | POA: Diagnosis not present

## 2019-04-07 DIAGNOSIS — D631 Anemia in chronic kidney disease: Secondary | ICD-10-CM | POA: Diagnosis not present

## 2019-04-07 DIAGNOSIS — N186 End stage renal disease: Secondary | ICD-10-CM | POA: Diagnosis not present

## 2019-04-07 DIAGNOSIS — E118 Type 2 diabetes mellitus with unspecified complications: Secondary | ICD-10-CM | POA: Diagnosis not present

## 2019-04-10 DIAGNOSIS — D631 Anemia in chronic kidney disease: Secondary | ICD-10-CM | POA: Diagnosis not present

## 2019-04-10 DIAGNOSIS — N2581 Secondary hyperparathyroidism of renal origin: Secondary | ICD-10-CM | POA: Diagnosis not present

## 2019-04-10 DIAGNOSIS — E118 Type 2 diabetes mellitus with unspecified complications: Secondary | ICD-10-CM | POA: Diagnosis not present

## 2019-04-10 DIAGNOSIS — D509 Iron deficiency anemia, unspecified: Secondary | ICD-10-CM | POA: Diagnosis not present

## 2019-04-10 DIAGNOSIS — N186 End stage renal disease: Secondary | ICD-10-CM | POA: Diagnosis not present

## 2019-04-12 DIAGNOSIS — D631 Anemia in chronic kidney disease: Secondary | ICD-10-CM | POA: Diagnosis not present

## 2019-04-12 DIAGNOSIS — N2581 Secondary hyperparathyroidism of renal origin: Secondary | ICD-10-CM | POA: Diagnosis not present

## 2019-04-12 DIAGNOSIS — N186 End stage renal disease: Secondary | ICD-10-CM | POA: Diagnosis not present

## 2019-04-12 DIAGNOSIS — D509 Iron deficiency anemia, unspecified: Secondary | ICD-10-CM | POA: Diagnosis not present

## 2019-04-12 DIAGNOSIS — E118 Type 2 diabetes mellitus with unspecified complications: Secondary | ICD-10-CM | POA: Diagnosis not present

## 2019-04-14 DIAGNOSIS — D631 Anemia in chronic kidney disease: Secondary | ICD-10-CM | POA: Diagnosis not present

## 2019-04-14 DIAGNOSIS — N186 End stage renal disease: Secondary | ICD-10-CM | POA: Diagnosis not present

## 2019-04-14 DIAGNOSIS — N2581 Secondary hyperparathyroidism of renal origin: Secondary | ICD-10-CM | POA: Diagnosis not present

## 2019-04-14 DIAGNOSIS — E118 Type 2 diabetes mellitus with unspecified complications: Secondary | ICD-10-CM | POA: Diagnosis not present

## 2019-04-14 DIAGNOSIS — D509 Iron deficiency anemia, unspecified: Secondary | ICD-10-CM | POA: Diagnosis not present

## 2019-04-17 DIAGNOSIS — N186 End stage renal disease: Secondary | ICD-10-CM | POA: Diagnosis not present

## 2019-04-17 DIAGNOSIS — N2581 Secondary hyperparathyroidism of renal origin: Secondary | ICD-10-CM | POA: Diagnosis not present

## 2019-04-17 DIAGNOSIS — D631 Anemia in chronic kidney disease: Secondary | ICD-10-CM | POA: Diagnosis not present

## 2019-04-17 DIAGNOSIS — D509 Iron deficiency anemia, unspecified: Secondary | ICD-10-CM | POA: Diagnosis not present

## 2019-04-17 DIAGNOSIS — E118 Type 2 diabetes mellitus with unspecified complications: Secondary | ICD-10-CM | POA: Diagnosis not present

## 2019-04-19 DIAGNOSIS — D509 Iron deficiency anemia, unspecified: Secondary | ICD-10-CM | POA: Diagnosis not present

## 2019-04-19 DIAGNOSIS — D631 Anemia in chronic kidney disease: Secondary | ICD-10-CM | POA: Diagnosis not present

## 2019-04-19 DIAGNOSIS — N2581 Secondary hyperparathyroidism of renal origin: Secondary | ICD-10-CM | POA: Diagnosis not present

## 2019-04-19 DIAGNOSIS — E118 Type 2 diabetes mellitus with unspecified complications: Secondary | ICD-10-CM | POA: Diagnosis not present

## 2019-04-19 DIAGNOSIS — N186 End stage renal disease: Secondary | ICD-10-CM | POA: Diagnosis not present

## 2019-04-21 DIAGNOSIS — E118 Type 2 diabetes mellitus with unspecified complications: Secondary | ICD-10-CM | POA: Diagnosis not present

## 2019-04-21 DIAGNOSIS — D631 Anemia in chronic kidney disease: Secondary | ICD-10-CM | POA: Diagnosis not present

## 2019-04-21 DIAGNOSIS — N2581 Secondary hyperparathyroidism of renal origin: Secondary | ICD-10-CM | POA: Diagnosis not present

## 2019-04-21 DIAGNOSIS — N186 End stage renal disease: Secondary | ICD-10-CM | POA: Diagnosis not present

## 2019-04-21 DIAGNOSIS — D509 Iron deficiency anemia, unspecified: Secondary | ICD-10-CM | POA: Diagnosis not present

## 2019-04-24 DIAGNOSIS — D509 Iron deficiency anemia, unspecified: Secondary | ICD-10-CM | POA: Diagnosis not present

## 2019-04-24 DIAGNOSIS — D631 Anemia in chronic kidney disease: Secondary | ICD-10-CM | POA: Diagnosis not present

## 2019-04-24 DIAGNOSIS — N186 End stage renal disease: Secondary | ICD-10-CM | POA: Diagnosis not present

## 2019-04-24 DIAGNOSIS — E118 Type 2 diabetes mellitus with unspecified complications: Secondary | ICD-10-CM | POA: Diagnosis not present

## 2019-04-24 DIAGNOSIS — N2581 Secondary hyperparathyroidism of renal origin: Secondary | ICD-10-CM | POA: Diagnosis not present

## 2019-04-26 DIAGNOSIS — N2581 Secondary hyperparathyroidism of renal origin: Secondary | ICD-10-CM | POA: Diagnosis not present

## 2019-04-26 DIAGNOSIS — D509 Iron deficiency anemia, unspecified: Secondary | ICD-10-CM | POA: Diagnosis not present

## 2019-04-26 DIAGNOSIS — N186 End stage renal disease: Secondary | ICD-10-CM | POA: Diagnosis not present

## 2019-04-26 DIAGNOSIS — D631 Anemia in chronic kidney disease: Secondary | ICD-10-CM | POA: Diagnosis not present

## 2019-04-26 DIAGNOSIS — E118 Type 2 diabetes mellitus with unspecified complications: Secondary | ICD-10-CM | POA: Diagnosis not present

## 2019-04-28 DIAGNOSIS — N2581 Secondary hyperparathyroidism of renal origin: Secondary | ICD-10-CM | POA: Diagnosis not present

## 2019-04-28 DIAGNOSIS — D509 Iron deficiency anemia, unspecified: Secondary | ICD-10-CM | POA: Diagnosis not present

## 2019-04-28 DIAGNOSIS — N186 End stage renal disease: Secondary | ICD-10-CM | POA: Diagnosis not present

## 2019-04-28 DIAGNOSIS — E118 Type 2 diabetes mellitus with unspecified complications: Secondary | ICD-10-CM | POA: Diagnosis not present

## 2019-04-28 DIAGNOSIS — D631 Anemia in chronic kidney disease: Secondary | ICD-10-CM | POA: Diagnosis not present

## 2019-05-01 DIAGNOSIS — N2581 Secondary hyperparathyroidism of renal origin: Secondary | ICD-10-CM | POA: Diagnosis not present

## 2019-05-01 DIAGNOSIS — D631 Anemia in chronic kidney disease: Secondary | ICD-10-CM | POA: Diagnosis not present

## 2019-05-01 DIAGNOSIS — E118 Type 2 diabetes mellitus with unspecified complications: Secondary | ICD-10-CM | POA: Diagnosis not present

## 2019-05-01 DIAGNOSIS — D509 Iron deficiency anemia, unspecified: Secondary | ICD-10-CM | POA: Diagnosis not present

## 2019-05-01 DIAGNOSIS — N186 End stage renal disease: Secondary | ICD-10-CM | POA: Diagnosis not present

## 2019-05-03 DIAGNOSIS — D631 Anemia in chronic kidney disease: Secondary | ICD-10-CM | POA: Diagnosis not present

## 2019-05-03 DIAGNOSIS — E118 Type 2 diabetes mellitus with unspecified complications: Secondary | ICD-10-CM | POA: Diagnosis not present

## 2019-05-03 DIAGNOSIS — N186 End stage renal disease: Secondary | ICD-10-CM | POA: Diagnosis not present

## 2019-05-03 DIAGNOSIS — N2581 Secondary hyperparathyroidism of renal origin: Secondary | ICD-10-CM | POA: Diagnosis not present

## 2019-05-03 DIAGNOSIS — D509 Iron deficiency anemia, unspecified: Secondary | ICD-10-CM | POA: Diagnosis not present

## 2019-05-05 DIAGNOSIS — N2581 Secondary hyperparathyroidism of renal origin: Secondary | ICD-10-CM | POA: Diagnosis not present

## 2019-05-05 DIAGNOSIS — E118 Type 2 diabetes mellitus with unspecified complications: Secondary | ICD-10-CM | POA: Diagnosis not present

## 2019-05-05 DIAGNOSIS — N186 End stage renal disease: Secondary | ICD-10-CM | POA: Diagnosis not present

## 2019-05-05 DIAGNOSIS — D631 Anemia in chronic kidney disease: Secondary | ICD-10-CM | POA: Diagnosis not present

## 2019-05-05 DIAGNOSIS — D509 Iron deficiency anemia, unspecified: Secondary | ICD-10-CM | POA: Diagnosis not present

## 2019-05-07 DIAGNOSIS — Z992 Dependence on renal dialysis: Secondary | ICD-10-CM | POA: Diagnosis not present

## 2019-05-07 DIAGNOSIS — N186 End stage renal disease: Secondary | ICD-10-CM | POA: Diagnosis not present

## 2019-05-07 DIAGNOSIS — N041 Nephrotic syndrome with focal and segmental glomerular lesions: Secondary | ICD-10-CM | POA: Diagnosis not present

## 2019-05-08 DIAGNOSIS — N2581 Secondary hyperparathyroidism of renal origin: Secondary | ICD-10-CM | POA: Diagnosis not present

## 2019-05-08 DIAGNOSIS — N186 End stage renal disease: Secondary | ICD-10-CM | POA: Diagnosis not present

## 2019-05-08 DIAGNOSIS — E118 Type 2 diabetes mellitus with unspecified complications: Secondary | ICD-10-CM | POA: Diagnosis not present

## 2019-05-10 DIAGNOSIS — N186 End stage renal disease: Secondary | ICD-10-CM | POA: Diagnosis not present

## 2019-05-10 DIAGNOSIS — N2581 Secondary hyperparathyroidism of renal origin: Secondary | ICD-10-CM | POA: Diagnosis not present

## 2019-05-10 DIAGNOSIS — E118 Type 2 diabetes mellitus with unspecified complications: Secondary | ICD-10-CM | POA: Diagnosis not present

## 2019-05-12 DIAGNOSIS — N186 End stage renal disease: Secondary | ICD-10-CM | POA: Diagnosis not present

## 2019-05-12 DIAGNOSIS — N2581 Secondary hyperparathyroidism of renal origin: Secondary | ICD-10-CM | POA: Diagnosis not present

## 2019-05-12 DIAGNOSIS — E118 Type 2 diabetes mellitus with unspecified complications: Secondary | ICD-10-CM | POA: Diagnosis not present

## 2019-05-15 DIAGNOSIS — E118 Type 2 diabetes mellitus with unspecified complications: Secondary | ICD-10-CM | POA: Diagnosis not present

## 2019-05-15 DIAGNOSIS — N186 End stage renal disease: Secondary | ICD-10-CM | POA: Diagnosis not present

## 2019-05-15 DIAGNOSIS — N2581 Secondary hyperparathyroidism of renal origin: Secondary | ICD-10-CM | POA: Diagnosis not present

## 2019-05-17 DIAGNOSIS — N2581 Secondary hyperparathyroidism of renal origin: Secondary | ICD-10-CM | POA: Diagnosis not present

## 2019-05-17 DIAGNOSIS — N186 End stage renal disease: Secondary | ICD-10-CM | POA: Diagnosis not present

## 2019-05-17 DIAGNOSIS — E118 Type 2 diabetes mellitus with unspecified complications: Secondary | ICD-10-CM | POA: Diagnosis not present

## 2019-05-19 DIAGNOSIS — N2581 Secondary hyperparathyroidism of renal origin: Secondary | ICD-10-CM | POA: Diagnosis not present

## 2019-05-19 DIAGNOSIS — E118 Type 2 diabetes mellitus with unspecified complications: Secondary | ICD-10-CM | POA: Diagnosis not present

## 2019-05-19 DIAGNOSIS — N186 End stage renal disease: Secondary | ICD-10-CM | POA: Diagnosis not present

## 2019-05-22 DIAGNOSIS — N186 End stage renal disease: Secondary | ICD-10-CM | POA: Diagnosis not present

## 2019-05-22 DIAGNOSIS — E118 Type 2 diabetes mellitus with unspecified complications: Secondary | ICD-10-CM | POA: Diagnosis not present

## 2019-05-22 DIAGNOSIS — N2581 Secondary hyperparathyroidism of renal origin: Secondary | ICD-10-CM | POA: Diagnosis not present

## 2019-05-24 DIAGNOSIS — N186 End stage renal disease: Secondary | ICD-10-CM | POA: Diagnosis not present

## 2019-05-24 DIAGNOSIS — N2581 Secondary hyperparathyroidism of renal origin: Secondary | ICD-10-CM | POA: Diagnosis not present

## 2019-05-24 DIAGNOSIS — E118 Type 2 diabetes mellitus with unspecified complications: Secondary | ICD-10-CM | POA: Diagnosis not present

## 2019-05-26 DIAGNOSIS — N186 End stage renal disease: Secondary | ICD-10-CM | POA: Diagnosis not present

## 2019-05-26 DIAGNOSIS — E118 Type 2 diabetes mellitus with unspecified complications: Secondary | ICD-10-CM | POA: Diagnosis not present

## 2019-05-26 DIAGNOSIS — N2581 Secondary hyperparathyroidism of renal origin: Secondary | ICD-10-CM | POA: Diagnosis not present

## 2019-05-29 DIAGNOSIS — N2581 Secondary hyperparathyroidism of renal origin: Secondary | ICD-10-CM | POA: Diagnosis not present

## 2019-05-29 DIAGNOSIS — N186 End stage renal disease: Secondary | ICD-10-CM | POA: Diagnosis not present

## 2019-05-29 DIAGNOSIS — E118 Type 2 diabetes mellitus with unspecified complications: Secondary | ICD-10-CM | POA: Diagnosis not present

## 2019-05-31 DIAGNOSIS — N2581 Secondary hyperparathyroidism of renal origin: Secondary | ICD-10-CM | POA: Diagnosis not present

## 2019-05-31 DIAGNOSIS — N186 End stage renal disease: Secondary | ICD-10-CM | POA: Diagnosis not present

## 2019-05-31 DIAGNOSIS — E118 Type 2 diabetes mellitus with unspecified complications: Secondary | ICD-10-CM | POA: Diagnosis not present

## 2019-06-02 DIAGNOSIS — N2581 Secondary hyperparathyroidism of renal origin: Secondary | ICD-10-CM | POA: Diagnosis not present

## 2019-06-02 DIAGNOSIS — N186 End stage renal disease: Secondary | ICD-10-CM | POA: Diagnosis not present

## 2019-06-02 DIAGNOSIS — E118 Type 2 diabetes mellitus with unspecified complications: Secondary | ICD-10-CM | POA: Diagnosis not present

## 2019-06-05 DIAGNOSIS — N186 End stage renal disease: Secondary | ICD-10-CM | POA: Diagnosis not present

## 2019-06-05 DIAGNOSIS — E118 Type 2 diabetes mellitus with unspecified complications: Secondary | ICD-10-CM | POA: Diagnosis not present

## 2019-06-05 DIAGNOSIS — N2581 Secondary hyperparathyroidism of renal origin: Secondary | ICD-10-CM | POA: Diagnosis not present

## 2019-06-06 DIAGNOSIS — N041 Nephrotic syndrome with focal and segmental glomerular lesions: Secondary | ICD-10-CM | POA: Diagnosis not present

## 2019-06-06 DIAGNOSIS — N186 End stage renal disease: Secondary | ICD-10-CM | POA: Diagnosis not present

## 2019-06-06 DIAGNOSIS — Z992 Dependence on renal dialysis: Secondary | ICD-10-CM | POA: Diagnosis not present

## 2019-06-07 DIAGNOSIS — N2581 Secondary hyperparathyroidism of renal origin: Secondary | ICD-10-CM | POA: Diagnosis not present

## 2019-06-07 DIAGNOSIS — E118 Type 2 diabetes mellitus with unspecified complications: Secondary | ICD-10-CM | POA: Diagnosis not present

## 2019-06-07 DIAGNOSIS — N186 End stage renal disease: Secondary | ICD-10-CM | POA: Diagnosis not present

## 2019-06-09 DIAGNOSIS — N186 End stage renal disease: Secondary | ICD-10-CM | POA: Diagnosis not present

## 2019-06-09 DIAGNOSIS — E118 Type 2 diabetes mellitus with unspecified complications: Secondary | ICD-10-CM | POA: Diagnosis not present

## 2019-06-09 DIAGNOSIS — N2581 Secondary hyperparathyroidism of renal origin: Secondary | ICD-10-CM | POA: Diagnosis not present

## 2019-06-12 DIAGNOSIS — N186 End stage renal disease: Secondary | ICD-10-CM | POA: Diagnosis not present

## 2019-06-12 DIAGNOSIS — N2581 Secondary hyperparathyroidism of renal origin: Secondary | ICD-10-CM | POA: Diagnosis not present

## 2019-06-12 DIAGNOSIS — E118 Type 2 diabetes mellitus with unspecified complications: Secondary | ICD-10-CM | POA: Diagnosis not present

## 2019-06-14 DIAGNOSIS — E118 Type 2 diabetes mellitus with unspecified complications: Secondary | ICD-10-CM | POA: Diagnosis not present

## 2019-06-14 DIAGNOSIS — N186 End stage renal disease: Secondary | ICD-10-CM | POA: Diagnosis not present

## 2019-06-14 DIAGNOSIS — N2581 Secondary hyperparathyroidism of renal origin: Secondary | ICD-10-CM | POA: Diagnosis not present

## 2019-06-16 DIAGNOSIS — E118 Type 2 diabetes mellitus with unspecified complications: Secondary | ICD-10-CM | POA: Diagnosis not present

## 2019-06-16 DIAGNOSIS — N186 End stage renal disease: Secondary | ICD-10-CM | POA: Diagnosis not present

## 2019-06-16 DIAGNOSIS — N2581 Secondary hyperparathyroidism of renal origin: Secondary | ICD-10-CM | POA: Diagnosis not present

## 2019-06-19 DIAGNOSIS — E118 Type 2 diabetes mellitus with unspecified complications: Secondary | ICD-10-CM | POA: Diagnosis not present

## 2019-06-19 DIAGNOSIS — N2581 Secondary hyperparathyroidism of renal origin: Secondary | ICD-10-CM | POA: Diagnosis not present

## 2019-06-19 DIAGNOSIS — N186 End stage renal disease: Secondary | ICD-10-CM | POA: Diagnosis not present

## 2019-06-21 DIAGNOSIS — N2581 Secondary hyperparathyroidism of renal origin: Secondary | ICD-10-CM | POA: Diagnosis not present

## 2019-06-21 DIAGNOSIS — E118 Type 2 diabetes mellitus with unspecified complications: Secondary | ICD-10-CM | POA: Diagnosis not present

## 2019-06-21 DIAGNOSIS — N186 End stage renal disease: Secondary | ICD-10-CM | POA: Diagnosis not present

## 2019-06-23 DIAGNOSIS — N2581 Secondary hyperparathyroidism of renal origin: Secondary | ICD-10-CM | POA: Diagnosis not present

## 2019-06-23 DIAGNOSIS — E118 Type 2 diabetes mellitus with unspecified complications: Secondary | ICD-10-CM | POA: Diagnosis not present

## 2019-06-23 DIAGNOSIS — N186 End stage renal disease: Secondary | ICD-10-CM | POA: Diagnosis not present

## 2019-06-26 DIAGNOSIS — E118 Type 2 diabetes mellitus with unspecified complications: Secondary | ICD-10-CM | POA: Diagnosis not present

## 2019-06-26 DIAGNOSIS — N2581 Secondary hyperparathyroidism of renal origin: Secondary | ICD-10-CM | POA: Diagnosis not present

## 2019-06-26 DIAGNOSIS — N186 End stage renal disease: Secondary | ICD-10-CM | POA: Diagnosis not present

## 2019-06-28 DIAGNOSIS — E118 Type 2 diabetes mellitus with unspecified complications: Secondary | ICD-10-CM | POA: Diagnosis not present

## 2019-06-28 DIAGNOSIS — N2581 Secondary hyperparathyroidism of renal origin: Secondary | ICD-10-CM | POA: Diagnosis not present

## 2019-06-28 DIAGNOSIS — N186 End stage renal disease: Secondary | ICD-10-CM | POA: Diagnosis not present

## 2019-06-30 DIAGNOSIS — E118 Type 2 diabetes mellitus with unspecified complications: Secondary | ICD-10-CM | POA: Diagnosis not present

## 2019-06-30 DIAGNOSIS — N186 End stage renal disease: Secondary | ICD-10-CM | POA: Diagnosis not present

## 2019-06-30 DIAGNOSIS — N2581 Secondary hyperparathyroidism of renal origin: Secondary | ICD-10-CM | POA: Diagnosis not present

## 2019-07-03 DIAGNOSIS — N2581 Secondary hyperparathyroidism of renal origin: Secondary | ICD-10-CM | POA: Diagnosis not present

## 2019-07-03 DIAGNOSIS — N186 End stage renal disease: Secondary | ICD-10-CM | POA: Diagnosis not present

## 2019-07-03 DIAGNOSIS — E118 Type 2 diabetes mellitus with unspecified complications: Secondary | ICD-10-CM | POA: Diagnosis not present

## 2019-07-05 DIAGNOSIS — N186 End stage renal disease: Secondary | ICD-10-CM | POA: Diagnosis not present

## 2019-07-05 DIAGNOSIS — E118 Type 2 diabetes mellitus with unspecified complications: Secondary | ICD-10-CM | POA: Diagnosis not present

## 2019-07-05 DIAGNOSIS — N2581 Secondary hyperparathyroidism of renal origin: Secondary | ICD-10-CM | POA: Diagnosis not present

## 2019-07-07 DIAGNOSIS — N2581 Secondary hyperparathyroidism of renal origin: Secondary | ICD-10-CM | POA: Diagnosis not present

## 2019-07-07 DIAGNOSIS — N186 End stage renal disease: Secondary | ICD-10-CM | POA: Diagnosis not present

## 2019-07-07 DIAGNOSIS — N041 Nephrotic syndrome with focal and segmental glomerular lesions: Secondary | ICD-10-CM | POA: Diagnosis not present

## 2019-07-07 DIAGNOSIS — E118 Type 2 diabetes mellitus with unspecified complications: Secondary | ICD-10-CM | POA: Diagnosis not present

## 2019-07-07 DIAGNOSIS — Z992 Dependence on renal dialysis: Secondary | ICD-10-CM | POA: Diagnosis not present

## 2019-07-10 DIAGNOSIS — N2581 Secondary hyperparathyroidism of renal origin: Secondary | ICD-10-CM | POA: Diagnosis not present

## 2019-07-10 DIAGNOSIS — N186 End stage renal disease: Secondary | ICD-10-CM | POA: Diagnosis not present

## 2019-07-10 DIAGNOSIS — E118 Type 2 diabetes mellitus with unspecified complications: Secondary | ICD-10-CM | POA: Diagnosis not present

## 2019-07-10 DIAGNOSIS — Z992 Dependence on renal dialysis: Secondary | ICD-10-CM | POA: Diagnosis not present

## 2019-07-12 DIAGNOSIS — N186 End stage renal disease: Secondary | ICD-10-CM | POA: Diagnosis not present

## 2019-07-12 DIAGNOSIS — Z992 Dependence on renal dialysis: Secondary | ICD-10-CM | POA: Diagnosis not present

## 2019-07-12 DIAGNOSIS — E118 Type 2 diabetes mellitus with unspecified complications: Secondary | ICD-10-CM | POA: Diagnosis not present

## 2019-07-12 DIAGNOSIS — N2581 Secondary hyperparathyroidism of renal origin: Secondary | ICD-10-CM | POA: Diagnosis not present

## 2019-07-13 ENCOUNTER — Other Ambulatory Visit (HOSPITAL_COMMUNITY): Payer: Self-pay | Admitting: *Deleted

## 2019-07-13 DIAGNOSIS — C9000 Multiple myeloma not having achieved remission: Secondary | ICD-10-CM

## 2019-07-14 DIAGNOSIS — N186 End stage renal disease: Secondary | ICD-10-CM | POA: Diagnosis not present

## 2019-07-14 DIAGNOSIS — N2581 Secondary hyperparathyroidism of renal origin: Secondary | ICD-10-CM | POA: Diagnosis not present

## 2019-07-14 DIAGNOSIS — Z992 Dependence on renal dialysis: Secondary | ICD-10-CM | POA: Diagnosis not present

## 2019-07-14 DIAGNOSIS — E118 Type 2 diabetes mellitus with unspecified complications: Secondary | ICD-10-CM | POA: Diagnosis not present

## 2019-07-16 ENCOUNTER — Inpatient Hospital Stay (HOSPITAL_BASED_OUTPATIENT_CLINIC_OR_DEPARTMENT_OTHER): Payer: Medicare Other | Admitting: Hematology

## 2019-07-16 ENCOUNTER — Other Ambulatory Visit: Payer: Self-pay

## 2019-07-16 ENCOUNTER — Inpatient Hospital Stay (HOSPITAL_COMMUNITY): Payer: Medicare Other | Attending: Hematology

## 2019-07-16 VITALS — BP 147/76 | HR 80 | Temp 97.5°F | Resp 18 | Wt 199.4 lb

## 2019-07-16 DIAGNOSIS — C9000 Multiple myeloma not having achieved remission: Secondary | ICD-10-CM | POA: Diagnosis not present

## 2019-07-16 DIAGNOSIS — N186 End stage renal disease: Secondary | ICD-10-CM | POA: Insufficient documentation

## 2019-07-16 DIAGNOSIS — I132 Hypertensive heart and chronic kidney disease with heart failure and with stage 5 chronic kidney disease, or end stage renal disease: Secondary | ICD-10-CM | POA: Diagnosis not present

## 2019-07-16 DIAGNOSIS — Z79899 Other long term (current) drug therapy: Secondary | ICD-10-CM | POA: Diagnosis not present

## 2019-07-16 DIAGNOSIS — E1122 Type 2 diabetes mellitus with diabetic chronic kidney disease: Secondary | ICD-10-CM | POA: Diagnosis not present

## 2019-07-16 LAB — COMPREHENSIVE METABOLIC PANEL
ALT: 14 U/L (ref 0–44)
AST: 15 U/L (ref 15–41)
Albumin: 3.7 g/dL (ref 3.5–5.0)
Alkaline Phosphatase: 80 U/L (ref 38–126)
Anion gap: 15 (ref 5–15)
BUN: 45 mg/dL — ABNORMAL HIGH (ref 8–23)
CO2: 25 mmol/L (ref 22–32)
Calcium: 8.5 mg/dL — ABNORMAL LOW (ref 8.9–10.3)
Chloride: 86 mmol/L — ABNORMAL LOW (ref 98–111)
Creatinine, Ser: 10.34 mg/dL — ABNORMAL HIGH (ref 0.61–1.24)
GFR calc Af Amer: 6 mL/min — ABNORMAL LOW (ref >60)
GFR calc non Af Amer: 5 mL/min — ABNORMAL LOW (ref >60)
Glucose, Bld: 188 mg/dL — ABNORMAL HIGH (ref 70–99)
Potassium: 4.9 mmol/L (ref 3.5–5.1)
Sodium: 126 mmol/L — ABNORMAL LOW (ref 135–145)
Total Bilirubin: 0.8 mg/dL (ref 0.3–1.2)
Total Protein: 9.4 g/dL — ABNORMAL HIGH (ref 6.5–8.1)

## 2019-07-16 LAB — CBC WITH DIFFERENTIAL/PLATELET
Abs Immature Granulocytes: 0.02 10*3/uL (ref 0.00–0.07)
Basophils Absolute: 0.1 10*3/uL (ref 0.0–0.1)
Basophils Relative: 1 %
Eosinophils Absolute: 0.3 10*3/uL (ref 0.0–0.5)
Eosinophils Relative: 4 %
HCT: 45.3 % (ref 39.0–52.0)
Hemoglobin: 14.7 g/dL (ref 13.0–17.0)
Immature Granulocytes: 0 %
Lymphocytes Relative: 23 %
Lymphs Abs: 1.7 10*3/uL (ref 0.7–4.0)
MCH: 31.5 pg (ref 26.0–34.0)
MCHC: 32.5 g/dL (ref 30.0–36.0)
MCV: 97.2 fL (ref 80.0–100.0)
Monocytes Absolute: 0.6 10*3/uL (ref 0.1–1.0)
Monocytes Relative: 8 %
Neutro Abs: 4.6 10*3/uL (ref 1.7–7.7)
Neutrophils Relative %: 64 %
Platelets: 176 10*3/uL (ref 150–400)
RBC: 4.66 MIL/uL (ref 4.22–5.81)
RDW: 14.9 % (ref 11.5–15.5)
WBC: 7.2 10*3/uL (ref 4.0–10.5)
nRBC: 0 % (ref 0.0–0.2)

## 2019-07-16 LAB — IRON AND TIBC
Iron: 57 ug/dL (ref 45–182)
Saturation Ratios: 29 % (ref 17.9–39.5)
TIBC: 195 ug/dL — ABNORMAL LOW (ref 250–450)
UIBC: 138 ug/dL

## 2019-07-16 LAB — FERRITIN: Ferritin: 893 ng/mL — ABNORMAL HIGH (ref 24–336)

## 2019-07-16 LAB — VITAMIN B12: Vitamin B-12: 269 pg/mL (ref 180–914)

## 2019-07-16 LAB — FOLATE: Folate: 13.1 ng/mL (ref >5.9)

## 2019-07-16 LAB — LACTATE DEHYDROGENASE: LDH: 124 U/L (ref 98–192)

## 2019-07-16 NOTE — Assessment & Plan Note (Addendum)
1. Smoldering IgG Kappa Multiple Myeloma - Diagnosed in 03/2015; presented with renal insufficiency. - 06/2015: Multiple myeloma labs significant for M spike of 3.3, IgG 4914, kappa elevated at 186.73, lambda elevated at 47.27, ratio 3.95, and IFE positive for IgG kappa monoclonal gammopathy. - 2016: Bone Marrow Biopsy: 11% plasma cells; normal cytogenetics - 2016: Skeletal Survey: Negative  - 2018: MM  Labs: SPEP revealed M spike of 2.9, IgG elevated at 3420, Kappa 109.1, lambda 33.0, ratio 3.31, and IFE positive for IgG kappa. -Today, I have recommended patient proceed with a skeletal survey to further evaluate for lytic lesions.  Multiple myeloma labs are pending from today.  His CBC did not reveal any evidence of anemia.  No hypercalcemia. He does have end-stage renal disease and is currently on hemodialysis Tuesday Thursday and Saturday.  Patient states that he may seek kidney transplant options.  Discussed with patient if he would seek kidney transplants options that we would recommend therapy for multiple myeloma most likely Cytoxan, Velcade and Dex so he would be eligible for transplant.  If skeletal survey reveals any evidence of lytic lesions patient would be considered to have active multiple myeloma and would need treatment.  He also mentions that he has a brother that was recently diagnosed with multiple myeloma completed stem cell transplant at [facility]. -He will return to clinic in 3 months for repeat labs and office visit. 

## 2019-07-16 NOTE — Progress Notes (Signed)
[place], [place] [id number]   CLINIC:  Medical Oncology/Hematology  PCP:  No primary care provider on file. No primary provider on file. None   REASON FOR VISIT:  Follow-up for Smoldering Multiple Myeloma   CURRENT THERAPY: Clinical surveillance    INTERVAL HISTORY:  Mr. Demirjian 63 y.o. male presents today for follow up. Reports overall doing well. Denies any significant fatigue. He is currently receiving HD on T, TH, Saturday. He has not followed up in almost two years. He denies any new bony pain. No recent infections or hospitalizations. He is here for repeat labs and office visit.    REVIEW OF SYSTEMS:  Review of Systems  Constitutional: Negative.   HENT:  Negative.   Eyes: Negative.   Respiratory: Negative.   Cardiovascular: Negative.   Gastrointestinal: Negative.   Endocrine: Negative.   Genitourinary:        Hemodialysis   Musculoskeletal: Positive for arthralgias, back pain and myalgias.  Skin: Negative.   Neurological: Positive for extremity weakness.  Hematological: Negative.      PAST MEDICAL/SURGICAL HISTORY:  Past Medical History:  Diagnosis Date  . Asthma    as a child  . CHF (congestive heart failure) ([place])   . Chronic kidney disease    TTHS  . Diabetes mellitus without complication (HCC)    Type II  . ESRD (end stage renal disease) ([place])   . Hypertension    Past Surgical History:  Procedure Laterality Date  . AV FISTULA PLACEMENT Left 04/08/2015   Procedure: LEFT RADIOCEPHALIC ARTERIOVENOUS (AV) FISTULA CREATION;  Surgeon: Elam Dutch, MD;  Location: [facility];  Service: Vascular;  Laterality: Left;  . AV FISTULA PLACEMENT Right 09/15/2015   Procedure: RADIOCEPHALIC VERSUS BRACHIOCEPHALIC ARTERIOVENOUS (AV) FISTULA CREATION - RIGHT ARM;  Surgeon: Conrad Fenton, MD;  Location: [facility];  Service: Vascular;  Laterality: Right;  . AV FISTULA PLACEMENT Left 06/07/2016   Procedure: LEFT BRACHIOCEPHALIC  ARTERIOVENOUS (AV) FISTULA CREATION;  Surgeon: Elam Dutch, MD;  Location: [facility];  Service: Vascular;  Laterality: Left;  . COLONOSCOPY    . IR GENERIC HISTORICAL  08/04/2016   IR REMOVAL TUN CV CATH W/O FL 08/04/2016 Ascencion Dike, PA-C [facility]  . IR GENERIC HISTORICAL  08/06/2016   IR FLUORO GUIDE CV LINE LEFT 08/06/2016 Arne Cleveland, MD [facility]  . IR GENERIC HISTORICAL  08/06/2016   IR US GUIDE VASC ACCESS LEFT 08/06/2016 Arne Cleveland, MD [facility]  . LIGATION OF ARTERIOVENOUS  FISTULA Left 09/15/2015   Procedure: LIGATION OF RADIOCEPHALIC ARTERIOVENOUS FISTULA - LEFT ARM;  Surgeon: Conrad Cheval, MD;  Location: [facility];  Service: Vascular;  Laterality: Left;  . LIGATION OF ARTERIOVENOUS  FISTULA Right 04/16/2016   Procedure: LIGATION OF ARTERIOVENOUS  FISTULA;  Surgeon: Elam Dutch, MD;  Location: [facility];  Service: Vascular;  Laterality: Right;  . LIGATION OF COMPETING BRANCHES OF ARTERIOVENOUS FISTULA Left 07/16/2015   Procedure: LIGATION OF SIDE BRANCHES OF ARTERIOVENOUS FISTULA LEFT ARM;  Surgeon: Rosetta Posner, MD;  Location: [facility];  Service: Vascular;  Laterality: Left;  . PERIPHERAL VASCULAR CATHETERIZATION N/A 07/04/2015   Procedure: Fistulagram;  Surgeon: Elam Dutch, MD;  Location: [facility];  Service: Cardiovascular;  Laterality: N/A;  . PERIPHERAL VASCULAR CATHETERIZATION N/A 09/08/2015   Procedure: Fistulagram;  Surgeon: Conrad Garrett, MD;  Location: [facility];  Service: Cardiovascular;  Laterality: N/A;  . PERIPHERAL VASCULAR CATHETERIZATION  Right 02/23/2016   Procedure: Fistulagram;  Surgeon: Conrad Latta, MD;  Location: [facility];  Service: Cardiovascular;  Laterality: Right;  . PERIPHERAL VASCULAR CATHETERIZATION N/A 04/12/2016   Procedure: Fistulagram;  Surgeon: Conrad Glasford, MD;  Location: [facility];  Service: Cardiovascular;  Laterality: N/A;  . REVISON OF ARTERIOVENOUS FISTULA Right 03/10/2016   Procedure: RIGHT CEPHALIC  VEIN TURNDOWN;  Surgeon: Conrad Calumet, MD;  Location: [facility];  Service: Vascular;  Laterality: Right;  . TEE WITHOUT CARDIOVERSION N/A 08/06/2016   Procedure: TRANSESOPHAGEAL ECHOCARDIOGRAM (TEE);  Surgeon: Dixie Dials, MD;  Location: [facility];  Service: Cardiovascular;  Laterality: N/A;     SOCIAL HISTORY:  Social History   Socioeconomic History  . Marital status: Single    Spouse name: Not on file  . Number of children: Not on file  . Years of education: Not on file  . Highest education level: Not on file  Occupational History  . Not on file  Social Needs  . Financial resource strain: Not on file  . Food insecurity    Worry: Not on file    Inability: Not on file  . Transportation needs    Medical: Not on file    Non-medical: Not on file  Tobacco Use  . Smoking status: Former Smoker    Years: 1.00    Types: Cigarettes    Quit date: 07/08/2005    Years since quitting: 14.0  . Smokeless tobacco: Never Used  Substance and Sexual Activity  . Alcohol use: No    Alcohol/week: 0.0 standard drinks  . Drug use: No  . Sexual activity: Not on file  Lifestyle  . Physical activity    Days per week: Not on file    Minutes per session: Not on file  . Stress: Not on file  Relationships  . Social Herbalist on phone: Not on file    Gets together: Not on file    Attends religious service: Not on file    Active member of club or organization: Not on file    Attends meetings of clubs or organizations: Not on file    Relationship status: Not on file  . Intimate partner violence    Fear of current or ex partner: Not on file    Emotionally abused: Not on file    Physically abused: Not on file    Forced sexual activity: Not on file  Other Topics Concern  . Not on file  Social History Narrative  . Not on file    FAMILY HISTORY:  Family History  Problem Relation Age of Onset  . Hypertension Other     CURRENT MEDICATIONS:  Outpatient Encounter Medications as of  07/16/2019  Medication Sig  . calcium acetate (PHOSLO) 667 MG capsule Take 2 capsules (1,334 mg total) by mouth 3 (three) times daily with meals.  Marland Kitchen ceFAZolin (ANCEF) 2-4 GM/100ML-% IVPB Inject 100 mLs (2 g total) into the vein Every Tuesday,Thursday,and Saturday with dialysis.  . Darbepoetin Alfa (ARANESP) 100 MCG/0.5ML SOSY injection Inject 0.5 mLs (100 mcg total) into the vein every Thursday with hemodialysis. Will be given at dialysis.  Marland Kitchen gabapentin (NEURONTIN) 300 MG capsule Take 1 capsule (300 mg total) by mouth at bedtime.  Marland Kitchen glucose blood (ONETOUCH VERIO) test strip Test three times daily  . isosorbide mononitrate (IMDUR) 60 MG 24 hr tablet Take 1 tablet (60 mg total) by mouth daily.  . insulin aspart (NOVOLOG) 100 UNIT/ML injection  Inject 6 Units into the skin 3 (three) times daily before meals. (Patient not taking: Reported on 07/16/2019)  . insulin detemir (LEVEMIR) 100 UNIT/ML injection Inject 0.4 mLs (40 Units total) into the skin at bedtime. (Patient not taking: Reported on 07/16/2019)  . Vitamin D, Ergocalciferol, (DRISDOL) 50000 units CAPS capsule Take 50,000 Units by mouth every 7 (seven) days. Monday   No facility-administered encounter medications on file as of 07/16/2019.     ALLERGIES:  No Known Allergies   PHYSICAL EXAM:  ECOG Performance status: 2  Vitals:   07/16/19 1008  BP: (!) 147/76  Pulse: 80  Resp: 18  Temp: (!) 97.5 F (36.4 C)  SpO2: 100%   Filed Weights   07/16/19 1008  Weight: 199 lb 6.4 oz (90.4 kg)    Physical Exam Constitutional:      Appearance: Normal appearance. He is obese.  HENT:     Head: Normocephalic.     Right Ear: External ear normal.     Left Ear: External ear normal.     Nose: Nose normal.     Mouth/Throat:     Mouth: Mucous membranes are moist.     Pharynx: Oropharynx is clear.  Eyes:     Extraocular Movements: Extraocular movements intact.     Conjunctiva/sclera: Conjunctivae normal.  Neck:     Musculoskeletal: Normal  range of motion.  Cardiovascular:     Rate and Rhythm: Normal rate and regular rhythm.     Pulses: Normal pulses.     Heart sounds: Normal heart sounds.  Pulmonary:     Effort: Pulmonary effort is normal.     Breath sounds: Normal breath sounds.  Abdominal:     General: Bowel sounds are normal.     Palpations: Abdomen is soft.  Musculoskeletal: Normal range of motion.  Skin:    General: Skin is warm.  Neurological:     General: No focal deficit present.     Mental Status: He is alert and oriented to person, place, and time.  Psychiatric:        Mood and Affect: Mood normal.        Behavior: Behavior normal.        Thought Content: Thought content normal.        Judgment: Judgment normal.      LABORATORY DATA:  I have reviewed the labs as listed.  CBC    Component Value Date/Time   WBC 7.2 07/16/2019 0931   RBC 4.66 07/16/2019 0931   HGB 14.7 07/16/2019 0931   HGB 13.0 09/05/2017 1607   HCT 45.3 07/16/2019 0931   HCT 38.4 09/05/2017 1607   PLT 176 07/16/2019 0931   PLT 204 09/05/2017 1607   MCV 97.2 07/16/2019 0931   MCV 93 09/05/2017 1607   MCH 31.5 07/16/2019 0931   MCHC 32.5 07/16/2019 0931   RDW 14.9 07/16/2019 0931   RDW 14.4 09/05/2017 1607   LYMPHSABS 1.7 07/16/2019 0931   LYMPHSABS 1.6 09/05/2017 1607   MONOABS 0.6 07/16/2019 0931   EOSABS 0.3 07/16/2019 0931   EOSABS 0.2 09/05/2017 1607   BASOSABS 0.1 07/16/2019 0931   BASOSABS 0.1 09/05/2017 1607   CMP Latest Ref Rng & Units 07/16/2019 09/05/2017 04/29/2017  Glucose 70 - 99 mg/dL 188(H) 102(H) 123(H)  BUN 8 - 23 mg/dL 45(H) 48(H) 23(H)  Creatinine 0.61 - 1.24 mg/dL 10.34(H) 9.92(&gt;) 6.63(H)  Sodium 135 - 145 mmol/L 126(L) 128(L) 131(L)  Potassium 3.5 - 5.1 mmol/L 4.9 4.5 4.2  Chloride  98 - 111 mmol/L 86(L) 88(L) 91(L)  CO2 22 - 32 mmol/L 25 26 36(H)  Calcium 8.9 - 10.3 mg/dL 8.5(L) 9.3 9.1  Total Protein 6.5 - 8.1 g/dL 9.4(H) 8.8(H) 8.7(H)  Total Bilirubin 0.3 - 1.2 mg/dL 0.8 0.6 0.9  Alkaline  Phos 38 - 126 U/L 80 60 56  AST 15 - 41 U/L 15 17 14(L)  ALT 0 - 44 U/L 14 8 8(L)       ASSESSMENT & PLAN:   Multiple myeloma (HCC) 1. Smoldering IgG Kappa Multiple Myeloma - Diagnosed in 03/2015; presented with renal insufficiency. - 06/2015: Multiple myeloma labs significant for M spike of 3.3, IgG 4914, kappa elevated at 186.73, lambda elevated at 47.27, ratio 3.95, and IFE positive for IgG kappa monoclonal gammopathy. - 2016: Bone Marrow Biopsy: 11% plasma cells; normal cytogenetics - 2016: Skeletal Survey: Negative  - 2018: MM  Labs: SPEP revealed M spike of 2.9, IgG elevated at 3420, Kappa 109.1, lambda 33.0, ratio 3.31, and IFE positive for IgG kappa. -Today, I have recommended patient proceed with a skeletal survey to further evaluate for lytic lesions.  Multiple myeloma labs are pending from today.  His CBC did not reveal any evidence of anemia.  No hypercalcemia. He does have end-stage renal disease and is currently on hemodialysis Tuesday Thursday and Saturday.  Patient states that he may seek kidney transplant options.  Discussed with patient if he would seek kidney transplants options that we would recommend therapy for multiple myeloma most likely Cytoxan, Velcade and Dex so he would be eligible for transplant.  If skeletal survey reveals any evidence of lytic lesions patient would be considered to have active multiple myeloma and would need treatment.  He also mentions that he has a brother that was recently diagnosed with multiple myeloma completed stem cell [facility]. -He will return to clinic in 3 months for repeat labs and office visit.      Orders placed this encounter:  Orders Placed This Encounter  Procedures  . NM Bone Scan Whole Body  . DG Bone Survey Met  . CBC with Differential  . Comprehensive metabolic panel  . Beta 2 microglobuline, serum  . Kappa/lambda light chains  . Multiple Myeloma Panel (SPEP&IFE w/QIG)      [place] [phone]

## 2019-07-17 DIAGNOSIS — E118 Type 2 diabetes mellitus with unspecified complications: Secondary | ICD-10-CM | POA: Diagnosis not present

## 2019-07-17 DIAGNOSIS — N2581 Secondary hyperparathyroidism of renal origin: Secondary | ICD-10-CM | POA: Diagnosis not present

## 2019-07-17 DIAGNOSIS — Z992 Dependence on renal dialysis: Secondary | ICD-10-CM | POA: Diagnosis not present

## 2019-07-17 DIAGNOSIS — N186 End stage renal disease: Secondary | ICD-10-CM | POA: Diagnosis not present

## 2019-07-18 LAB — MULTIPLE MYELOMA PANEL, SERUM
Albumin SerPl Elph-Mcnc: 4 g/dL (ref 2.9–4.4)
Albumin/Glob SerPl: 0.9 (ref 0.7–1.7)
Alpha 1: 0.2 g/dL (ref 0.0–0.4)
Alpha2 Glob SerPl Elph-Mcnc: 0.8 g/dL (ref 0.4–1.0)
B-Globulin SerPl Elph-Mcnc: 0.6 g/dL — ABNORMAL LOW (ref 0.7–1.3)
Gamma Glob SerPl Elph-Mcnc: 3.1 g/dL — ABNORMAL HIGH (ref 0.4–1.8)
Globulin, Total: 4.8 g/dL — ABNORMAL HIGH (ref 2.2–3.9)
IgA: 48 mg/dL — ABNORMAL LOW (ref 61–437)
IgG (Immunoglobin G), Serum: 4160 mg/dL — ABNORMAL HIGH (ref 603–1613)
IgM (Immunoglobulin M), Srm: 10 mg/dL — ABNORMAL LOW (ref 20–172)
M Protein SerPl Elph-Mcnc: 2.8 g/dL — ABNORMAL HIGH
Total Protein ELP: 8.8 g/dL — ABNORMAL HIGH (ref 6.0–8.5)

## 2019-07-19 DIAGNOSIS — Z992 Dependence on renal dialysis: Secondary | ICD-10-CM | POA: Diagnosis not present

## 2019-07-19 DIAGNOSIS — N2581 Secondary hyperparathyroidism of renal origin: Secondary | ICD-10-CM | POA: Diagnosis not present

## 2019-07-19 DIAGNOSIS — E118 Type 2 diabetes mellitus with unspecified complications: Secondary | ICD-10-CM | POA: Diagnosis not present

## 2019-07-19 DIAGNOSIS — N186 End stage renal disease: Secondary | ICD-10-CM | POA: Diagnosis not present

## 2019-07-21 DIAGNOSIS — N2581 Secondary hyperparathyroidism of renal origin: Secondary | ICD-10-CM | POA: Diagnosis not present

## 2019-07-21 DIAGNOSIS — N186 End stage renal disease: Secondary | ICD-10-CM | POA: Diagnosis not present

## 2019-07-21 DIAGNOSIS — Z992 Dependence on renal dialysis: Secondary | ICD-10-CM | POA: Diagnosis not present

## 2019-07-21 DIAGNOSIS — E118 Type 2 diabetes mellitus with unspecified complications: Secondary | ICD-10-CM | POA: Diagnosis not present

## 2019-07-24 DIAGNOSIS — N186 End stage renal disease: Secondary | ICD-10-CM | POA: Diagnosis not present

## 2019-07-24 DIAGNOSIS — E118 Type 2 diabetes mellitus with unspecified complications: Secondary | ICD-10-CM | POA: Diagnosis not present

## 2019-07-24 DIAGNOSIS — N2581 Secondary hyperparathyroidism of renal origin: Secondary | ICD-10-CM | POA: Diagnosis not present

## 2019-07-24 DIAGNOSIS — Z992 Dependence on renal dialysis: Secondary | ICD-10-CM | POA: Diagnosis not present

## 2019-07-25 ENCOUNTER — Other Ambulatory Visit (HOSPITAL_COMMUNITY): Payer: Medicare Other

## 2019-07-25 ENCOUNTER — Other Ambulatory Visit: Payer: Self-pay

## 2019-07-25 ENCOUNTER — Ambulatory Visit (HOSPITAL_COMMUNITY)
Admission: RE | Admit: 2019-07-25 | Discharge: 2019-07-25 | Disposition: A | Discharge status: Home / Self Care | Payer: Medicare Other | Source: Ambulatory Visit | Attending: Hematology | Admitting: Hematology

## 2019-07-25 DIAGNOSIS — C9000 Multiple myeloma not having achieved remission: Secondary | ICD-10-CM | POA: Diagnosis not present

## 2019-07-26 DIAGNOSIS — E118 Type 2 diabetes mellitus with unspecified complications: Secondary | ICD-10-CM | POA: Diagnosis not present

## 2019-07-26 DIAGNOSIS — N186 End stage renal disease: Secondary | ICD-10-CM | POA: Diagnosis not present

## 2019-07-26 DIAGNOSIS — Z992 Dependence on renal dialysis: Secondary | ICD-10-CM | POA: Diagnosis not present

## 2019-07-26 DIAGNOSIS — N2581 Secondary hyperparathyroidism of renal origin: Secondary | ICD-10-CM | POA: Diagnosis not present

## 2019-07-28 DIAGNOSIS — N2581 Secondary hyperparathyroidism of renal origin: Secondary | ICD-10-CM | POA: Diagnosis not present

## 2019-07-28 DIAGNOSIS — E118 Type 2 diabetes mellitus with unspecified complications: Secondary | ICD-10-CM | POA: Diagnosis not present

## 2019-07-28 DIAGNOSIS — N186 End stage renal disease: Secondary | ICD-10-CM | POA: Diagnosis not present

## 2019-07-28 DIAGNOSIS — Z992 Dependence on renal dialysis: Secondary | ICD-10-CM | POA: Diagnosis not present

## 2019-07-31 DIAGNOSIS — N2581 Secondary hyperparathyroidism of renal origin: Secondary | ICD-10-CM | POA: Diagnosis not present

## 2019-07-31 DIAGNOSIS — Z992 Dependence on renal dialysis: Secondary | ICD-10-CM | POA: Diagnosis not present

## 2019-07-31 DIAGNOSIS — N186 End stage renal disease: Secondary | ICD-10-CM | POA: Diagnosis not present

## 2019-07-31 DIAGNOSIS — E118 Type 2 diabetes mellitus with unspecified complications: Secondary | ICD-10-CM | POA: Diagnosis not present

## 2019-08-02 DIAGNOSIS — E118 Type 2 diabetes mellitus with unspecified complications: Secondary | ICD-10-CM | POA: Diagnosis not present

## 2019-08-02 DIAGNOSIS — N186 End stage renal disease: Secondary | ICD-10-CM | POA: Diagnosis not present

## 2019-08-02 DIAGNOSIS — N2581 Secondary hyperparathyroidism of renal origin: Secondary | ICD-10-CM | POA: Diagnosis not present

## 2019-08-02 DIAGNOSIS — Z992 Dependence on renal dialysis: Secondary | ICD-10-CM | POA: Diagnosis not present

## 2019-08-04 DIAGNOSIS — N186 End stage renal disease: Secondary | ICD-10-CM | POA: Diagnosis not present

## 2019-08-04 DIAGNOSIS — Z992 Dependence on renal dialysis: Secondary | ICD-10-CM | POA: Diagnosis not present

## 2019-08-04 DIAGNOSIS — N2581 Secondary hyperparathyroidism of renal origin: Secondary | ICD-10-CM | POA: Diagnosis not present

## 2019-08-04 DIAGNOSIS — E118 Type 2 diabetes mellitus with unspecified complications: Secondary | ICD-10-CM | POA: Diagnosis not present

## 2019-08-07 DIAGNOSIS — Z992 Dependence on renal dialysis: Secondary | ICD-10-CM | POA: Diagnosis not present

## 2019-08-07 DIAGNOSIS — N2581 Secondary hyperparathyroidism of renal origin: Secondary | ICD-10-CM | POA: Diagnosis not present

## 2019-08-07 DIAGNOSIS — E118 Type 2 diabetes mellitus with unspecified complications: Secondary | ICD-10-CM | POA: Diagnosis not present

## 2019-08-07 DIAGNOSIS — Z23 Encounter for immunization: Secondary | ICD-10-CM | POA: Diagnosis not present

## 2019-08-07 DIAGNOSIS — N186 End stage renal disease: Secondary | ICD-10-CM | POA: Diagnosis not present

## 2019-08-07 DIAGNOSIS — N041 Nephrotic syndrome with focal and segmental glomerular lesions: Secondary | ICD-10-CM | POA: Diagnosis not present

## 2019-08-09 DIAGNOSIS — N2581 Secondary hyperparathyroidism of renal origin: Secondary | ICD-10-CM | POA: Diagnosis not present

## 2019-08-09 DIAGNOSIS — Z992 Dependence on renal dialysis: Secondary | ICD-10-CM | POA: Diagnosis not present

## 2019-08-09 DIAGNOSIS — E118 Type 2 diabetes mellitus with unspecified complications: Secondary | ICD-10-CM | POA: Diagnosis not present

## 2019-08-09 DIAGNOSIS — Z23 Encounter for immunization: Secondary | ICD-10-CM | POA: Diagnosis not present

## 2019-08-09 DIAGNOSIS — N186 End stage renal disease: Secondary | ICD-10-CM | POA: Diagnosis not present

## 2019-08-11 DIAGNOSIS — Z23 Encounter for immunization: Secondary | ICD-10-CM | POA: Diagnosis not present

## 2019-08-11 DIAGNOSIS — N2581 Secondary hyperparathyroidism of renal origin: Secondary | ICD-10-CM | POA: Diagnosis not present

## 2019-08-11 DIAGNOSIS — Z992 Dependence on renal dialysis: Secondary | ICD-10-CM | POA: Diagnosis not present

## 2019-08-11 DIAGNOSIS — N186 End stage renal disease: Secondary | ICD-10-CM | POA: Diagnosis not present

## 2019-08-11 DIAGNOSIS — E118 Type 2 diabetes mellitus with unspecified complications: Secondary | ICD-10-CM | POA: Diagnosis not present

## 2019-08-14 DIAGNOSIS — Z992 Dependence on renal dialysis: Secondary | ICD-10-CM | POA: Diagnosis not present

## 2019-08-14 DIAGNOSIS — E118 Type 2 diabetes mellitus with unspecified complications: Secondary | ICD-10-CM | POA: Diagnosis not present

## 2019-08-14 DIAGNOSIS — Z23 Encounter for immunization: Secondary | ICD-10-CM | POA: Diagnosis not present

## 2019-08-14 DIAGNOSIS — N186 End stage renal disease: Secondary | ICD-10-CM | POA: Diagnosis not present

## 2019-08-14 DIAGNOSIS — N2581 Secondary hyperparathyroidism of renal origin: Secondary | ICD-10-CM | POA: Diagnosis not present

## 2019-08-15 DIAGNOSIS — Z992 Dependence on renal dialysis: Secondary | ICD-10-CM | POA: Diagnosis not present

## 2019-08-15 DIAGNOSIS — I871 Compression of vein: Secondary | ICD-10-CM | POA: Diagnosis not present

## 2019-08-15 DIAGNOSIS — T82858A Stenosis of vascular prosthetic devices, implants and grafts, initial encounter: Secondary | ICD-10-CM | POA: Diagnosis not present

## 2019-08-15 DIAGNOSIS — N186 End stage renal disease: Secondary | ICD-10-CM | POA: Diagnosis not present

## 2019-08-16 DIAGNOSIS — Z992 Dependence on renal dialysis: Secondary | ICD-10-CM | POA: Diagnosis not present

## 2019-08-16 DIAGNOSIS — Z23 Encounter for immunization: Secondary | ICD-10-CM | POA: Diagnosis not present

## 2019-08-16 DIAGNOSIS — N186 End stage renal disease: Secondary | ICD-10-CM | POA: Diagnosis not present

## 2019-08-16 DIAGNOSIS — E118 Type 2 diabetes mellitus with unspecified complications: Secondary | ICD-10-CM | POA: Diagnosis not present

## 2019-08-16 DIAGNOSIS — N2581 Secondary hyperparathyroidism of renal origin: Secondary | ICD-10-CM | POA: Diagnosis not present

## 2019-08-18 DIAGNOSIS — Z992 Dependence on renal dialysis: Secondary | ICD-10-CM | POA: Diagnosis not present

## 2019-08-18 DIAGNOSIS — N2581 Secondary hyperparathyroidism of renal origin: Secondary | ICD-10-CM | POA: Diagnosis not present

## 2019-08-18 DIAGNOSIS — Z23 Encounter for immunization: Secondary | ICD-10-CM | POA: Diagnosis not present

## 2019-08-18 DIAGNOSIS — N186 End stage renal disease: Secondary | ICD-10-CM | POA: Diagnosis not present

## 2019-08-18 DIAGNOSIS — E118 Type 2 diabetes mellitus with unspecified complications: Secondary | ICD-10-CM | POA: Diagnosis not present

## 2019-08-21 DIAGNOSIS — Z23 Encounter for immunization: Secondary | ICD-10-CM | POA: Diagnosis not present

## 2019-08-21 DIAGNOSIS — N186 End stage renal disease: Secondary | ICD-10-CM | POA: Diagnosis not present

## 2019-08-21 DIAGNOSIS — Z992 Dependence on renal dialysis: Secondary | ICD-10-CM | POA: Diagnosis not present

## 2019-08-21 DIAGNOSIS — E118 Type 2 diabetes mellitus with unspecified complications: Secondary | ICD-10-CM | POA: Diagnosis not present

## 2019-08-21 DIAGNOSIS — N2581 Secondary hyperparathyroidism of renal origin: Secondary | ICD-10-CM | POA: Diagnosis not present

## 2019-08-23 DIAGNOSIS — N186 End stage renal disease: Secondary | ICD-10-CM | POA: Diagnosis not present

## 2019-08-23 DIAGNOSIS — E118 Type 2 diabetes mellitus with unspecified complications: Secondary | ICD-10-CM | POA: Diagnosis not present

## 2019-08-23 DIAGNOSIS — N2581 Secondary hyperparathyroidism of renal origin: Secondary | ICD-10-CM | POA: Diagnosis not present

## 2019-08-23 DIAGNOSIS — Z992 Dependence on renal dialysis: Secondary | ICD-10-CM | POA: Diagnosis not present

## 2019-08-23 DIAGNOSIS — Z23 Encounter for immunization: Secondary | ICD-10-CM | POA: Diagnosis not present

## 2019-08-25 DIAGNOSIS — N2581 Secondary hyperparathyroidism of renal origin: Secondary | ICD-10-CM | POA: Diagnosis not present

## 2019-08-25 DIAGNOSIS — Z23 Encounter for immunization: Secondary | ICD-10-CM | POA: Diagnosis not present

## 2019-08-25 DIAGNOSIS — Z992 Dependence on renal dialysis: Secondary | ICD-10-CM | POA: Diagnosis not present

## 2019-08-25 DIAGNOSIS — N186 End stage renal disease: Secondary | ICD-10-CM | POA: Diagnosis not present

## 2019-08-25 DIAGNOSIS — E118 Type 2 diabetes mellitus with unspecified complications: Secondary | ICD-10-CM | POA: Diagnosis not present

## 2019-08-28 DIAGNOSIS — Z992 Dependence on renal dialysis: Secondary | ICD-10-CM | POA: Diagnosis not present

## 2019-08-28 DIAGNOSIS — N2581 Secondary hyperparathyroidism of renal origin: Secondary | ICD-10-CM | POA: Diagnosis not present

## 2019-08-28 DIAGNOSIS — N186 End stage renal disease: Secondary | ICD-10-CM | POA: Diagnosis not present

## 2019-08-28 DIAGNOSIS — E118 Type 2 diabetes mellitus with unspecified complications: Secondary | ICD-10-CM | POA: Diagnosis not present

## 2019-08-28 DIAGNOSIS — Z23 Encounter for immunization: Secondary | ICD-10-CM | POA: Diagnosis not present

## 2019-08-30 DIAGNOSIS — N186 End stage renal disease: Secondary | ICD-10-CM | POA: Diagnosis not present

## 2019-08-30 DIAGNOSIS — Z992 Dependence on renal dialysis: Secondary | ICD-10-CM | POA: Diagnosis not present

## 2019-08-30 DIAGNOSIS — E118 Type 2 diabetes mellitus with unspecified complications: Secondary | ICD-10-CM | POA: Diagnosis not present

## 2019-08-30 DIAGNOSIS — N2581 Secondary hyperparathyroidism of renal origin: Secondary | ICD-10-CM | POA: Diagnosis not present

## 2019-08-30 DIAGNOSIS — Z23 Encounter for immunization: Secondary | ICD-10-CM | POA: Diagnosis not present

## 2019-09-01 DIAGNOSIS — Z23 Encounter for immunization: Secondary | ICD-10-CM | POA: Diagnosis not present

## 2019-09-01 DIAGNOSIS — N2581 Secondary hyperparathyroidism of renal origin: Secondary | ICD-10-CM | POA: Diagnosis not present

## 2019-09-01 DIAGNOSIS — E118 Type 2 diabetes mellitus with unspecified complications: Secondary | ICD-10-CM | POA: Diagnosis not present

## 2019-09-01 DIAGNOSIS — N186 End stage renal disease: Secondary | ICD-10-CM | POA: Diagnosis not present

## 2019-09-01 DIAGNOSIS — Z992 Dependence on renal dialysis: Secondary | ICD-10-CM | POA: Diagnosis not present

## 2019-09-04 DIAGNOSIS — E118 Type 2 diabetes mellitus with unspecified complications: Secondary | ICD-10-CM | POA: Diagnosis not present

## 2019-09-04 DIAGNOSIS — Z23 Encounter for immunization: Secondary | ICD-10-CM | POA: Diagnosis not present

## 2019-09-04 DIAGNOSIS — Z992 Dependence on renal dialysis: Secondary | ICD-10-CM | POA: Diagnosis not present

## 2019-09-04 DIAGNOSIS — N186 End stage renal disease: Secondary | ICD-10-CM | POA: Diagnosis not present

## 2019-09-04 DIAGNOSIS — N2581 Secondary hyperparathyroidism of renal origin: Secondary | ICD-10-CM | POA: Diagnosis not present

## 2019-09-06 DIAGNOSIS — N2581 Secondary hyperparathyroidism of renal origin: Secondary | ICD-10-CM | POA: Diagnosis not present

## 2019-09-06 DIAGNOSIS — Z539 Procedure and treatment not carried out, unspecified reason: Secondary | ICD-10-CM | POA: Diagnosis not present

## 2019-09-06 DIAGNOSIS — N186 End stage renal disease: Secondary | ICD-10-CM | POA: Diagnosis not present

## 2019-09-06 DIAGNOSIS — Z992 Dependence on renal dialysis: Secondary | ICD-10-CM | POA: Diagnosis not present

## 2019-09-06 DIAGNOSIS — N041 Nephrotic syndrome with focal and segmental glomerular lesions: Secondary | ICD-10-CM | POA: Diagnosis not present

## 2019-09-06 DIAGNOSIS — E118 Type 2 diabetes mellitus with unspecified complications: Secondary | ICD-10-CM | POA: Diagnosis not present

## 2019-09-08 DIAGNOSIS — Z992 Dependence on renal dialysis: Secondary | ICD-10-CM | POA: Diagnosis not present

## 2019-09-08 DIAGNOSIS — N2581 Secondary hyperparathyroidism of renal origin: Secondary | ICD-10-CM | POA: Diagnosis not present

## 2019-09-08 DIAGNOSIS — E118 Type 2 diabetes mellitus with unspecified complications: Secondary | ICD-10-CM | POA: Diagnosis not present

## 2019-09-08 DIAGNOSIS — Z539 Procedure and treatment not carried out, unspecified reason: Secondary | ICD-10-CM | POA: Diagnosis not present

## 2019-09-08 DIAGNOSIS — N186 End stage renal disease: Secondary | ICD-10-CM | POA: Diagnosis not present

## 2019-09-11 DIAGNOSIS — E118 Type 2 diabetes mellitus with unspecified complications: Secondary | ICD-10-CM | POA: Diagnosis not present

## 2019-09-11 DIAGNOSIS — Z539 Procedure and treatment not carried out, unspecified reason: Secondary | ICD-10-CM | POA: Diagnosis not present

## 2019-09-11 DIAGNOSIS — Z992 Dependence on renal dialysis: Secondary | ICD-10-CM | POA: Diagnosis not present

## 2019-09-11 DIAGNOSIS — N186 End stage renal disease: Secondary | ICD-10-CM | POA: Diagnosis not present

## 2019-09-11 DIAGNOSIS — N2581 Secondary hyperparathyroidism of renal origin: Secondary | ICD-10-CM | POA: Diagnosis not present

## 2019-09-13 DIAGNOSIS — E118 Type 2 diabetes mellitus with unspecified complications: Secondary | ICD-10-CM | POA: Diagnosis not present

## 2019-09-13 DIAGNOSIS — Z539 Procedure and treatment not carried out, unspecified reason: Secondary | ICD-10-CM | POA: Diagnosis not present

## 2019-09-13 DIAGNOSIS — Z992 Dependence on renal dialysis: Secondary | ICD-10-CM | POA: Diagnosis not present

## 2019-09-13 DIAGNOSIS — N186 End stage renal disease: Secondary | ICD-10-CM | POA: Diagnosis not present

## 2019-09-13 DIAGNOSIS — N2581 Secondary hyperparathyroidism of renal origin: Secondary | ICD-10-CM | POA: Diagnosis not present

## 2019-09-15 DIAGNOSIS — N186 End stage renal disease: Secondary | ICD-10-CM | POA: Diagnosis not present

## 2019-09-15 DIAGNOSIS — Z539 Procedure and treatment not carried out, unspecified reason: Secondary | ICD-10-CM | POA: Diagnosis not present

## 2019-09-15 DIAGNOSIS — E118 Type 2 diabetes mellitus with unspecified complications: Secondary | ICD-10-CM | POA: Diagnosis not present

## 2019-09-15 DIAGNOSIS — Z992 Dependence on renal dialysis: Secondary | ICD-10-CM | POA: Diagnosis not present

## 2019-09-15 DIAGNOSIS — N2581 Secondary hyperparathyroidism of renal origin: Secondary | ICD-10-CM | POA: Diagnosis not present

## 2019-09-16 DIAGNOSIS — N2581 Secondary hyperparathyroidism of renal origin: Secondary | ICD-10-CM | POA: Diagnosis not present

## 2019-09-16 DIAGNOSIS — E118 Type 2 diabetes mellitus with unspecified complications: Secondary | ICD-10-CM | POA: Diagnosis not present

## 2019-09-16 DIAGNOSIS — Z992 Dependence on renal dialysis: Secondary | ICD-10-CM | POA: Diagnosis not present

## 2019-09-16 DIAGNOSIS — Z539 Procedure and treatment not carried out, unspecified reason: Secondary | ICD-10-CM | POA: Diagnosis not present

## 2019-09-16 DIAGNOSIS — N186 End stage renal disease: Secondary | ICD-10-CM | POA: Diagnosis not present

## 2019-09-18 DIAGNOSIS — Z992 Dependence on renal dialysis: Secondary | ICD-10-CM | POA: Diagnosis not present

## 2019-09-18 DIAGNOSIS — E118 Type 2 diabetes mellitus with unspecified complications: Secondary | ICD-10-CM | POA: Diagnosis not present

## 2019-09-18 DIAGNOSIS — N186 End stage renal disease: Secondary | ICD-10-CM | POA: Diagnosis not present

## 2019-09-18 DIAGNOSIS — Z539 Procedure and treatment not carried out, unspecified reason: Secondary | ICD-10-CM | POA: Diagnosis not present

## 2019-09-18 DIAGNOSIS — N2581 Secondary hyperparathyroidism of renal origin: Secondary | ICD-10-CM | POA: Diagnosis not present

## 2019-09-20 DIAGNOSIS — Z539 Procedure and treatment not carried out, unspecified reason: Secondary | ICD-10-CM | POA: Diagnosis not present

## 2019-09-20 DIAGNOSIS — E118 Type 2 diabetes mellitus with unspecified complications: Secondary | ICD-10-CM | POA: Diagnosis not present

## 2019-09-20 DIAGNOSIS — Z992 Dependence on renal dialysis: Secondary | ICD-10-CM | POA: Diagnosis not present

## 2019-09-20 DIAGNOSIS — N186 End stage renal disease: Secondary | ICD-10-CM | POA: Diagnosis not present

## 2019-09-20 DIAGNOSIS — N2581 Secondary hyperparathyroidism of renal origin: Secondary | ICD-10-CM | POA: Diagnosis not present

## 2019-09-22 DIAGNOSIS — N2581 Secondary hyperparathyroidism of renal origin: Secondary | ICD-10-CM | POA: Diagnosis not present

## 2019-09-22 DIAGNOSIS — E118 Type 2 diabetes mellitus with unspecified complications: Secondary | ICD-10-CM | POA: Diagnosis not present

## 2019-09-22 DIAGNOSIS — N186 End stage renal disease: Secondary | ICD-10-CM | POA: Diagnosis not present

## 2019-09-22 DIAGNOSIS — Z992 Dependence on renal dialysis: Secondary | ICD-10-CM | POA: Diagnosis not present

## 2019-09-22 DIAGNOSIS — Z539 Procedure and treatment not carried out, unspecified reason: Secondary | ICD-10-CM | POA: Diagnosis not present

## 2019-09-25 DIAGNOSIS — N186 End stage renal disease: Secondary | ICD-10-CM | POA: Diagnosis not present

## 2019-09-25 DIAGNOSIS — E118 Type 2 diabetes mellitus with unspecified complications: Secondary | ICD-10-CM | POA: Diagnosis not present

## 2019-09-25 DIAGNOSIS — Z539 Procedure and treatment not carried out, unspecified reason: Secondary | ICD-10-CM | POA: Diagnosis not present

## 2019-09-25 DIAGNOSIS — Z992 Dependence on renal dialysis: Secondary | ICD-10-CM | POA: Diagnosis not present

## 2019-09-25 DIAGNOSIS — N2581 Secondary hyperparathyroidism of renal origin: Secondary | ICD-10-CM | POA: Diagnosis not present

## 2019-09-27 DIAGNOSIS — Z992 Dependence on renal dialysis: Secondary | ICD-10-CM | POA: Diagnosis not present

## 2019-09-27 DIAGNOSIS — N186 End stage renal disease: Secondary | ICD-10-CM | POA: Diagnosis not present

## 2019-09-27 DIAGNOSIS — N2581 Secondary hyperparathyroidism of renal origin: Secondary | ICD-10-CM | POA: Diagnosis not present

## 2019-09-27 DIAGNOSIS — Z539 Procedure and treatment not carried out, unspecified reason: Secondary | ICD-10-CM | POA: Diagnosis not present

## 2019-09-27 DIAGNOSIS — E118 Type 2 diabetes mellitus with unspecified complications: Secondary | ICD-10-CM | POA: Diagnosis not present

## 2019-09-29 DIAGNOSIS — E118 Type 2 diabetes mellitus with unspecified complications: Secondary | ICD-10-CM | POA: Diagnosis not present

## 2019-09-29 DIAGNOSIS — N186 End stage renal disease: Secondary | ICD-10-CM | POA: Diagnosis not present

## 2019-09-29 DIAGNOSIS — Z992 Dependence on renal dialysis: Secondary | ICD-10-CM | POA: Diagnosis not present

## 2019-09-29 DIAGNOSIS — N2581 Secondary hyperparathyroidism of renal origin: Secondary | ICD-10-CM | POA: Diagnosis not present

## 2019-09-29 DIAGNOSIS — Z539 Procedure and treatment not carried out, unspecified reason: Secondary | ICD-10-CM | POA: Diagnosis not present

## 2019-10-02 DIAGNOSIS — N186 End stage renal disease: Secondary | ICD-10-CM | POA: Diagnosis not present

## 2019-10-02 DIAGNOSIS — Z539 Procedure and treatment not carried out, unspecified reason: Secondary | ICD-10-CM | POA: Diagnosis not present

## 2019-10-02 DIAGNOSIS — Z992 Dependence on renal dialysis: Secondary | ICD-10-CM | POA: Diagnosis not present

## 2019-10-02 DIAGNOSIS — E118 Type 2 diabetes mellitus with unspecified complications: Secondary | ICD-10-CM | POA: Diagnosis not present

## 2019-10-02 DIAGNOSIS — N2581 Secondary hyperparathyroidism of renal origin: Secondary | ICD-10-CM | POA: Diagnosis not present

## 2019-10-04 DIAGNOSIS — E118 Type 2 diabetes mellitus with unspecified complications: Secondary | ICD-10-CM | POA: Diagnosis not present

## 2019-10-04 DIAGNOSIS — Z992 Dependence on renal dialysis: Secondary | ICD-10-CM | POA: Diagnosis not present

## 2019-10-04 DIAGNOSIS — Z539 Procedure and treatment not carried out, unspecified reason: Secondary | ICD-10-CM | POA: Diagnosis not present

## 2019-10-04 DIAGNOSIS — N2581 Secondary hyperparathyroidism of renal origin: Secondary | ICD-10-CM | POA: Diagnosis not present

## 2019-10-04 DIAGNOSIS — N186 End stage renal disease: Secondary | ICD-10-CM | POA: Diagnosis not present

## 2019-10-06 DIAGNOSIS — N2581 Secondary hyperparathyroidism of renal origin: Secondary | ICD-10-CM | POA: Diagnosis not present

## 2019-10-06 DIAGNOSIS — Z992 Dependence on renal dialysis: Secondary | ICD-10-CM | POA: Diagnosis not present

## 2019-10-06 DIAGNOSIS — N186 End stage renal disease: Secondary | ICD-10-CM | POA: Diagnosis not present

## 2019-10-06 DIAGNOSIS — E118 Type 2 diabetes mellitus with unspecified complications: Secondary | ICD-10-CM | POA: Diagnosis not present

## 2019-10-06 DIAGNOSIS — Z539 Procedure and treatment not carried out, unspecified reason: Secondary | ICD-10-CM | POA: Diagnosis not present

## 2019-10-07 DIAGNOSIS — Z992 Dependence on renal dialysis: Secondary | ICD-10-CM | POA: Diagnosis not present

## 2019-10-07 DIAGNOSIS — N041 Nephrotic syndrome with focal and segmental glomerular lesions: Secondary | ICD-10-CM | POA: Diagnosis not present

## 2019-10-07 DIAGNOSIS — N186 End stage renal disease: Secondary | ICD-10-CM | POA: Diagnosis not present

## 2019-10-09 DIAGNOSIS — N2581 Secondary hyperparathyroidism of renal origin: Secondary | ICD-10-CM | POA: Diagnosis not present

## 2019-10-09 DIAGNOSIS — N186 End stage renal disease: Secondary | ICD-10-CM | POA: Diagnosis not present

## 2019-10-09 DIAGNOSIS — E118 Type 2 diabetes mellitus with unspecified complications: Secondary | ICD-10-CM | POA: Diagnosis not present

## 2019-10-09 DIAGNOSIS — Z992 Dependence on renal dialysis: Secondary | ICD-10-CM | POA: Diagnosis not present

## 2019-10-10 ENCOUNTER — Inpatient Hospital Stay (HOSPITAL_COMMUNITY): Payer: Medicare Other | Attending: Hematology

## 2019-10-11 DIAGNOSIS — Z992 Dependence on renal dialysis: Secondary | ICD-10-CM | POA: Diagnosis not present

## 2019-10-11 DIAGNOSIS — N2581 Secondary hyperparathyroidism of renal origin: Secondary | ICD-10-CM | POA: Diagnosis not present

## 2019-10-11 DIAGNOSIS — N186 End stage renal disease: Secondary | ICD-10-CM | POA: Diagnosis not present

## 2019-10-11 DIAGNOSIS — E118 Type 2 diabetes mellitus with unspecified complications: Secondary | ICD-10-CM | POA: Diagnosis not present

## 2019-10-13 DIAGNOSIS — E118 Type 2 diabetes mellitus with unspecified complications: Secondary | ICD-10-CM | POA: Diagnosis not present

## 2019-10-13 DIAGNOSIS — N186 End stage renal disease: Secondary | ICD-10-CM | POA: Diagnosis not present

## 2019-10-13 DIAGNOSIS — Z992 Dependence on renal dialysis: Secondary | ICD-10-CM | POA: Diagnosis not present

## 2019-10-13 DIAGNOSIS — N2581 Secondary hyperparathyroidism of renal origin: Secondary | ICD-10-CM | POA: Diagnosis not present

## 2019-10-16 DIAGNOSIS — N186 End stage renal disease: Secondary | ICD-10-CM | POA: Diagnosis not present

## 2019-10-16 DIAGNOSIS — Z992 Dependence on renal dialysis: Secondary | ICD-10-CM | POA: Diagnosis not present

## 2019-10-16 DIAGNOSIS — N2581 Secondary hyperparathyroidism of renal origin: Secondary | ICD-10-CM | POA: Diagnosis not present

## 2019-10-16 DIAGNOSIS — E118 Type 2 diabetes mellitus with unspecified complications: Secondary | ICD-10-CM | POA: Diagnosis not present

## 2019-10-17 ENCOUNTER — Ambulatory Visit (HOSPITAL_COMMUNITY): Payer: Medicare Other | Admitting: Hematology

## 2019-10-18 DIAGNOSIS — N186 End stage renal disease: Secondary | ICD-10-CM | POA: Diagnosis not present

## 2019-10-18 DIAGNOSIS — E118 Type 2 diabetes mellitus with unspecified complications: Secondary | ICD-10-CM | POA: Diagnosis not present

## 2019-10-18 DIAGNOSIS — N2581 Secondary hyperparathyroidism of renal origin: Secondary | ICD-10-CM | POA: Diagnosis not present

## 2019-10-18 DIAGNOSIS — Z992 Dependence on renal dialysis: Secondary | ICD-10-CM | POA: Diagnosis not present

## 2019-10-20 DIAGNOSIS — N186 End stage renal disease: Secondary | ICD-10-CM | POA: Diagnosis not present

## 2019-10-20 DIAGNOSIS — Z992 Dependence on renal dialysis: Secondary | ICD-10-CM | POA: Diagnosis not present

## 2019-10-20 DIAGNOSIS — N2581 Secondary hyperparathyroidism of renal origin: Secondary | ICD-10-CM | POA: Diagnosis not present

## 2019-10-20 DIAGNOSIS — E118 Type 2 diabetes mellitus with unspecified complications: Secondary | ICD-10-CM | POA: Diagnosis not present

## 2019-10-23 DIAGNOSIS — Z992 Dependence on renal dialysis: Secondary | ICD-10-CM | POA: Diagnosis not present

## 2019-10-23 DIAGNOSIS — E118 Type 2 diabetes mellitus with unspecified complications: Secondary | ICD-10-CM | POA: Diagnosis not present

## 2019-10-23 DIAGNOSIS — N2581 Secondary hyperparathyroidism of renal origin: Secondary | ICD-10-CM | POA: Diagnosis not present

## 2019-10-23 DIAGNOSIS — N186 End stage renal disease: Secondary | ICD-10-CM | POA: Diagnosis not present

## 2019-10-25 DIAGNOSIS — N186 End stage renal disease: Secondary | ICD-10-CM | POA: Diagnosis not present

## 2019-10-25 DIAGNOSIS — N2581 Secondary hyperparathyroidism of renal origin: Secondary | ICD-10-CM | POA: Diagnosis not present

## 2019-10-25 DIAGNOSIS — E118 Type 2 diabetes mellitus with unspecified complications: Secondary | ICD-10-CM | POA: Diagnosis not present

## 2019-10-25 DIAGNOSIS — Z992 Dependence on renal dialysis: Secondary | ICD-10-CM | POA: Diagnosis not present

## 2019-10-27 DIAGNOSIS — Z992 Dependence on renal dialysis: Secondary | ICD-10-CM | POA: Diagnosis not present

## 2019-10-27 DIAGNOSIS — E118 Type 2 diabetes mellitus with unspecified complications: Secondary | ICD-10-CM | POA: Diagnosis not present

## 2019-10-27 DIAGNOSIS — N2581 Secondary hyperparathyroidism of renal origin: Secondary | ICD-10-CM | POA: Diagnosis not present

## 2019-10-27 DIAGNOSIS — N186 End stage renal disease: Secondary | ICD-10-CM | POA: Diagnosis not present

## 2019-10-29 DIAGNOSIS — Z992 Dependence on renal dialysis: Secondary | ICD-10-CM | POA: Diagnosis not present

## 2019-10-29 DIAGNOSIS — N186 End stage renal disease: Secondary | ICD-10-CM | POA: Diagnosis not present

## 2019-10-29 DIAGNOSIS — E118 Type 2 diabetes mellitus with unspecified complications: Secondary | ICD-10-CM | POA: Diagnosis not present

## 2019-10-29 DIAGNOSIS — N2581 Secondary hyperparathyroidism of renal origin: Secondary | ICD-10-CM | POA: Diagnosis not present

## 2019-10-31 DIAGNOSIS — Z992 Dependence on renal dialysis: Secondary | ICD-10-CM | POA: Diagnosis not present

## 2019-10-31 DIAGNOSIS — N2581 Secondary hyperparathyroidism of renal origin: Secondary | ICD-10-CM | POA: Diagnosis not present

## 2019-10-31 DIAGNOSIS — N186 End stage renal disease: Secondary | ICD-10-CM | POA: Diagnosis not present

## 2019-10-31 DIAGNOSIS — E118 Type 2 diabetes mellitus with unspecified complications: Secondary | ICD-10-CM | POA: Diagnosis not present

## 2019-11-03 DIAGNOSIS — Z992 Dependence on renal dialysis: Secondary | ICD-10-CM | POA: Diagnosis not present

## 2019-11-03 DIAGNOSIS — N186 End stage renal disease: Secondary | ICD-10-CM | POA: Diagnosis not present

## 2019-11-03 DIAGNOSIS — E118 Type 2 diabetes mellitus with unspecified complications: Secondary | ICD-10-CM | POA: Diagnosis not present

## 2019-11-03 DIAGNOSIS — N2581 Secondary hyperparathyroidism of renal origin: Secondary | ICD-10-CM | POA: Diagnosis not present

## 2019-12-07 DIAGNOSIS — N041 Nephrotic syndrome with focal and segmental glomerular lesions: Secondary | ICD-10-CM | POA: Diagnosis not present

## 2019-12-07 DIAGNOSIS — Z992 Dependence on renal dialysis: Secondary | ICD-10-CM | POA: Diagnosis not present

## 2019-12-07 DIAGNOSIS — N186 End stage renal disease: Secondary | ICD-10-CM | POA: Diagnosis not present

## 2019-12-09 DIAGNOSIS — N186 End stage renal disease: Secondary | ICD-10-CM | POA: Diagnosis not present

## 2019-12-09 DIAGNOSIS — N2581 Secondary hyperparathyroidism of renal origin: Secondary | ICD-10-CM | POA: Diagnosis not present

## 2019-12-09 DIAGNOSIS — E118 Type 2 diabetes mellitus with unspecified complications: Secondary | ICD-10-CM | POA: Diagnosis not present

## 2019-12-09 DIAGNOSIS — Z992 Dependence on renal dialysis: Secondary | ICD-10-CM | POA: Diagnosis not present

## 2019-12-11 DIAGNOSIS — N186 End stage renal disease: Secondary | ICD-10-CM | POA: Diagnosis not present

## 2019-12-11 DIAGNOSIS — N2581 Secondary hyperparathyroidism of renal origin: Secondary | ICD-10-CM | POA: Diagnosis not present

## 2019-12-11 DIAGNOSIS — Z992 Dependence on renal dialysis: Secondary | ICD-10-CM | POA: Diagnosis not present

## 2019-12-11 DIAGNOSIS — E118 Type 2 diabetes mellitus with unspecified complications: Secondary | ICD-10-CM | POA: Diagnosis not present

## 2019-12-13 DIAGNOSIS — N2581 Secondary hyperparathyroidism of renal origin: Secondary | ICD-10-CM | POA: Diagnosis not present

## 2019-12-13 DIAGNOSIS — E118 Type 2 diabetes mellitus with unspecified complications: Secondary | ICD-10-CM | POA: Diagnosis not present

## 2019-12-13 DIAGNOSIS — N186 End stage renal disease: Secondary | ICD-10-CM | POA: Diagnosis not present

## 2019-12-13 DIAGNOSIS — Z992 Dependence on renal dialysis: Secondary | ICD-10-CM | POA: Diagnosis not present

## 2019-12-15 DIAGNOSIS — N186 End stage renal disease: Secondary | ICD-10-CM | POA: Diagnosis not present

## 2019-12-15 DIAGNOSIS — Z992 Dependence on renal dialysis: Secondary | ICD-10-CM | POA: Diagnosis not present

## 2019-12-15 DIAGNOSIS — E118 Type 2 diabetes mellitus with unspecified complications: Secondary | ICD-10-CM | POA: Diagnosis not present

## 2019-12-15 DIAGNOSIS — N2581 Secondary hyperparathyroidism of renal origin: Secondary | ICD-10-CM | POA: Diagnosis not present

## 2019-12-18 DIAGNOSIS — E118 Type 2 diabetes mellitus with unspecified complications: Secondary | ICD-10-CM | POA: Diagnosis not present

## 2019-12-18 DIAGNOSIS — Z992 Dependence on renal dialysis: Secondary | ICD-10-CM | POA: Diagnosis not present

## 2019-12-18 DIAGNOSIS — N2581 Secondary hyperparathyroidism of renal origin: Secondary | ICD-10-CM | POA: Diagnosis not present

## 2019-12-18 DIAGNOSIS — N186 End stage renal disease: Secondary | ICD-10-CM | POA: Diagnosis not present

## 2019-12-20 DIAGNOSIS — N186 End stage renal disease: Secondary | ICD-10-CM | POA: Diagnosis not present

## 2019-12-20 DIAGNOSIS — Z992 Dependence on renal dialysis: Secondary | ICD-10-CM | POA: Diagnosis not present

## 2019-12-20 DIAGNOSIS — E118 Type 2 diabetes mellitus with unspecified complications: Secondary | ICD-10-CM | POA: Diagnosis not present

## 2019-12-20 DIAGNOSIS — N2581 Secondary hyperparathyroidism of renal origin: Secondary | ICD-10-CM | POA: Diagnosis not present

## 2019-12-22 DIAGNOSIS — N2581 Secondary hyperparathyroidism of renal origin: Secondary | ICD-10-CM | POA: Diagnosis not present

## 2019-12-22 DIAGNOSIS — Z992 Dependence on renal dialysis: Secondary | ICD-10-CM | POA: Diagnosis not present

## 2019-12-22 DIAGNOSIS — N186 End stage renal disease: Secondary | ICD-10-CM | POA: Diagnosis not present

## 2019-12-22 DIAGNOSIS — E118 Type 2 diabetes mellitus with unspecified complications: Secondary | ICD-10-CM | POA: Diagnosis not present

## 2019-12-25 DIAGNOSIS — N186 End stage renal disease: Secondary | ICD-10-CM | POA: Diagnosis not present

## 2019-12-25 DIAGNOSIS — E118 Type 2 diabetes mellitus with unspecified complications: Secondary | ICD-10-CM | POA: Diagnosis not present

## 2019-12-25 DIAGNOSIS — N2581 Secondary hyperparathyroidism of renal origin: Secondary | ICD-10-CM | POA: Diagnosis not present

## 2019-12-25 DIAGNOSIS — Z992 Dependence on renal dialysis: Secondary | ICD-10-CM | POA: Diagnosis not present

## 2019-12-27 DIAGNOSIS — N186 End stage renal disease: Secondary | ICD-10-CM | POA: Diagnosis not present

## 2019-12-27 DIAGNOSIS — N2581 Secondary hyperparathyroidism of renal origin: Secondary | ICD-10-CM | POA: Diagnosis not present

## 2019-12-27 DIAGNOSIS — Z992 Dependence on renal dialysis: Secondary | ICD-10-CM | POA: Diagnosis not present

## 2019-12-27 DIAGNOSIS — E118 Type 2 diabetes mellitus with unspecified complications: Secondary | ICD-10-CM | POA: Diagnosis not present

## 2019-12-27 DIAGNOSIS — L97521 Non-pressure chronic ulcer of other part of left foot limited to breakdown of skin: Secondary | ICD-10-CM | POA: Diagnosis not present

## 2019-12-29 DIAGNOSIS — E118 Type 2 diabetes mellitus with unspecified complications: Secondary | ICD-10-CM | POA: Diagnosis not present

## 2019-12-29 DIAGNOSIS — Z992 Dependence on renal dialysis: Secondary | ICD-10-CM | POA: Diagnosis not present

## 2019-12-29 DIAGNOSIS — N2581 Secondary hyperparathyroidism of renal origin: Secondary | ICD-10-CM | POA: Diagnosis not present

## 2019-12-29 DIAGNOSIS — N186 End stage renal disease: Secondary | ICD-10-CM | POA: Diagnosis not present

## 2020-01-01 DIAGNOSIS — E118 Type 2 diabetes mellitus with unspecified complications: Secondary | ICD-10-CM | POA: Diagnosis not present

## 2020-01-01 DIAGNOSIS — N186 End stage renal disease: Secondary | ICD-10-CM | POA: Diagnosis not present

## 2020-01-01 DIAGNOSIS — Z992 Dependence on renal dialysis: Secondary | ICD-10-CM | POA: Diagnosis not present

## 2020-01-01 DIAGNOSIS — N2581 Secondary hyperparathyroidism of renal origin: Secondary | ICD-10-CM | POA: Diagnosis not present

## 2020-01-03 DIAGNOSIS — E118 Type 2 diabetes mellitus with unspecified complications: Secondary | ICD-10-CM | POA: Diagnosis not present

## 2020-01-03 DIAGNOSIS — N186 End stage renal disease: Secondary | ICD-10-CM | POA: Diagnosis not present

## 2020-01-03 DIAGNOSIS — N2581 Secondary hyperparathyroidism of renal origin: Secondary | ICD-10-CM | POA: Diagnosis not present

## 2020-01-03 DIAGNOSIS — Z992 Dependence on renal dialysis: Secondary | ICD-10-CM | POA: Diagnosis not present

## 2020-01-05 DIAGNOSIS — N186 End stage renal disease: Secondary | ICD-10-CM | POA: Diagnosis not present

## 2020-01-05 DIAGNOSIS — Z992 Dependence on renal dialysis: Secondary | ICD-10-CM | POA: Diagnosis not present

## 2020-01-05 DIAGNOSIS — E118 Type 2 diabetes mellitus with unspecified complications: Secondary | ICD-10-CM | POA: Diagnosis not present

## 2020-01-05 DIAGNOSIS — N2581 Secondary hyperparathyroidism of renal origin: Secondary | ICD-10-CM | POA: Diagnosis not present

## 2020-01-07 DIAGNOSIS — N186 End stage renal disease: Secondary | ICD-10-CM | POA: Diagnosis not present

## 2020-01-07 DIAGNOSIS — Z992 Dependence on renal dialysis: Secondary | ICD-10-CM | POA: Diagnosis not present

## 2020-01-07 DIAGNOSIS — N041 Nephrotic syndrome with focal and segmental glomerular lesions: Secondary | ICD-10-CM | POA: Diagnosis not present

## 2020-01-08 DIAGNOSIS — N2581 Secondary hyperparathyroidism of renal origin: Secondary | ICD-10-CM | POA: Diagnosis not present

## 2020-01-08 DIAGNOSIS — Z992 Dependence on renal dialysis: Secondary | ICD-10-CM | POA: Diagnosis not present

## 2020-01-08 DIAGNOSIS — N186 End stage renal disease: Secondary | ICD-10-CM | POA: Diagnosis not present

## 2020-01-08 DIAGNOSIS — E118 Type 2 diabetes mellitus with unspecified complications: Secondary | ICD-10-CM | POA: Diagnosis not present

## 2020-01-10 DIAGNOSIS — N186 End stage renal disease: Secondary | ICD-10-CM | POA: Diagnosis not present

## 2020-01-10 DIAGNOSIS — Z992 Dependence on renal dialysis: Secondary | ICD-10-CM | POA: Diagnosis not present

## 2020-01-10 DIAGNOSIS — N2581 Secondary hyperparathyroidism of renal origin: Secondary | ICD-10-CM | POA: Diagnosis not present

## 2020-01-10 DIAGNOSIS — E118 Type 2 diabetes mellitus with unspecified complications: Secondary | ICD-10-CM | POA: Diagnosis not present

## 2020-01-12 DIAGNOSIS — Z992 Dependence on renal dialysis: Secondary | ICD-10-CM | POA: Diagnosis not present

## 2020-01-12 DIAGNOSIS — E118 Type 2 diabetes mellitus with unspecified complications: Secondary | ICD-10-CM | POA: Diagnosis not present

## 2020-01-12 DIAGNOSIS — N186 End stage renal disease: Secondary | ICD-10-CM | POA: Diagnosis not present

## 2020-01-12 DIAGNOSIS — N2581 Secondary hyperparathyroidism of renal origin: Secondary | ICD-10-CM | POA: Diagnosis not present

## 2020-01-15 DIAGNOSIS — N2581 Secondary hyperparathyroidism of renal origin: Secondary | ICD-10-CM | POA: Diagnosis not present

## 2020-01-15 DIAGNOSIS — E118 Type 2 diabetes mellitus with unspecified complications: Secondary | ICD-10-CM | POA: Diagnosis not present

## 2020-01-15 DIAGNOSIS — Z992 Dependence on renal dialysis: Secondary | ICD-10-CM | POA: Diagnosis not present

## 2020-01-15 DIAGNOSIS — N186 End stage renal disease: Secondary | ICD-10-CM | POA: Diagnosis not present

## 2020-01-17 DIAGNOSIS — N2581 Secondary hyperparathyroidism of renal origin: Secondary | ICD-10-CM | POA: Diagnosis not present

## 2020-01-17 DIAGNOSIS — Z992 Dependence on renal dialysis: Secondary | ICD-10-CM | POA: Diagnosis not present

## 2020-01-17 DIAGNOSIS — L97422 Non-pressure chronic ulcer of left heel and midfoot with fat layer exposed: Secondary | ICD-10-CM | POA: Diagnosis not present

## 2020-01-17 DIAGNOSIS — N186 End stage renal disease: Secondary | ICD-10-CM | POA: Diagnosis not present

## 2020-01-17 DIAGNOSIS — E118 Type 2 diabetes mellitus with unspecified complications: Secondary | ICD-10-CM | POA: Diagnosis not present

## 2020-01-19 DIAGNOSIS — N186 End stage renal disease: Secondary | ICD-10-CM | POA: Diagnosis not present

## 2020-01-19 DIAGNOSIS — N2581 Secondary hyperparathyroidism of renal origin: Secondary | ICD-10-CM | POA: Diagnosis not present

## 2020-01-19 DIAGNOSIS — Z992 Dependence on renal dialysis: Secondary | ICD-10-CM | POA: Diagnosis not present

## 2020-01-19 DIAGNOSIS — E118 Type 2 diabetes mellitus with unspecified complications: Secondary | ICD-10-CM | POA: Diagnosis not present

## 2020-01-22 DIAGNOSIS — Z992 Dependence on renal dialysis: Secondary | ICD-10-CM | POA: Diagnosis not present

## 2020-01-22 DIAGNOSIS — N186 End stage renal disease: Secondary | ICD-10-CM | POA: Diagnosis not present

## 2020-01-22 DIAGNOSIS — E118 Type 2 diabetes mellitus with unspecified complications: Secondary | ICD-10-CM | POA: Diagnosis not present

## 2020-01-22 DIAGNOSIS — N2581 Secondary hyperparathyroidism of renal origin: Secondary | ICD-10-CM | POA: Diagnosis not present

## 2020-01-25 DIAGNOSIS — N2581 Secondary hyperparathyroidism of renal origin: Secondary | ICD-10-CM | POA: Diagnosis not present

## 2020-01-25 DIAGNOSIS — Z992 Dependence on renal dialysis: Secondary | ICD-10-CM | POA: Diagnosis not present

## 2020-01-25 DIAGNOSIS — E118 Type 2 diabetes mellitus with unspecified complications: Secondary | ICD-10-CM | POA: Diagnosis not present

## 2020-01-25 DIAGNOSIS — N186 End stage renal disease: Secondary | ICD-10-CM | POA: Diagnosis not present

## 2020-01-29 DIAGNOSIS — Z992 Dependence on renal dialysis: Secondary | ICD-10-CM | POA: Diagnosis not present

## 2020-01-29 DIAGNOSIS — N2581 Secondary hyperparathyroidism of renal origin: Secondary | ICD-10-CM | POA: Diagnosis not present

## 2020-01-29 DIAGNOSIS — N186 End stage renal disease: Secondary | ICD-10-CM | POA: Diagnosis not present

## 2020-01-29 DIAGNOSIS — E118 Type 2 diabetes mellitus with unspecified complications: Secondary | ICD-10-CM | POA: Diagnosis not present

## 2020-01-31 DIAGNOSIS — N2581 Secondary hyperparathyroidism of renal origin: Secondary | ICD-10-CM | POA: Diagnosis not present

## 2020-01-31 DIAGNOSIS — Z992 Dependence on renal dialysis: Secondary | ICD-10-CM | POA: Diagnosis not present

## 2020-01-31 DIAGNOSIS — N186 End stage renal disease: Secondary | ICD-10-CM | POA: Diagnosis not present

## 2020-01-31 DIAGNOSIS — M79672 Pain in left foot: Secondary | ICD-10-CM | POA: Diagnosis not present

## 2020-01-31 DIAGNOSIS — L97422 Non-pressure chronic ulcer of left heel and midfoot with fat layer exposed: Secondary | ICD-10-CM | POA: Diagnosis not present

## 2020-01-31 DIAGNOSIS — E118 Type 2 diabetes mellitus with unspecified complications: Secondary | ICD-10-CM | POA: Diagnosis not present

## 2020-02-02 DIAGNOSIS — Z992 Dependence on renal dialysis: Secondary | ICD-10-CM | POA: Diagnosis not present

## 2020-02-02 DIAGNOSIS — N186 End stage renal disease: Secondary | ICD-10-CM | POA: Diagnosis not present

## 2020-02-02 DIAGNOSIS — N2581 Secondary hyperparathyroidism of renal origin: Secondary | ICD-10-CM | POA: Diagnosis not present

## 2020-02-02 DIAGNOSIS — E118 Type 2 diabetes mellitus with unspecified complications: Secondary | ICD-10-CM | POA: Diagnosis not present

## 2020-02-04 DIAGNOSIS — N186 End stage renal disease: Secondary | ICD-10-CM | POA: Diagnosis not present

## 2020-02-04 DIAGNOSIS — N041 Nephrotic syndrome with focal and segmental glomerular lesions: Secondary | ICD-10-CM | POA: Diagnosis not present

## 2020-02-04 DIAGNOSIS — Z992 Dependence on renal dialysis: Secondary | ICD-10-CM | POA: Diagnosis not present

## 2020-02-05 DIAGNOSIS — Z992 Dependence on renal dialysis: Secondary | ICD-10-CM | POA: Diagnosis not present

## 2020-02-05 DIAGNOSIS — N2581 Secondary hyperparathyroidism of renal origin: Secondary | ICD-10-CM | POA: Diagnosis not present

## 2020-02-05 DIAGNOSIS — N186 End stage renal disease: Secondary | ICD-10-CM | POA: Diagnosis not present

## 2020-02-07 DIAGNOSIS — N186 End stage renal disease: Secondary | ICD-10-CM | POA: Diagnosis not present

## 2020-02-07 DIAGNOSIS — Z992 Dependence on renal dialysis: Secondary | ICD-10-CM | POA: Diagnosis not present

## 2020-02-07 DIAGNOSIS — N2581 Secondary hyperparathyroidism of renal origin: Secondary | ICD-10-CM | POA: Diagnosis not present

## 2020-02-09 DIAGNOSIS — N2581 Secondary hyperparathyroidism of renal origin: Secondary | ICD-10-CM | POA: Diagnosis not present

## 2020-02-09 DIAGNOSIS — N186 End stage renal disease: Secondary | ICD-10-CM | POA: Diagnosis not present

## 2020-02-09 DIAGNOSIS — Z992 Dependence on renal dialysis: Secondary | ICD-10-CM | POA: Diagnosis not present

## 2020-02-12 DIAGNOSIS — N2581 Secondary hyperparathyroidism of renal origin: Secondary | ICD-10-CM | POA: Diagnosis not present

## 2020-02-12 DIAGNOSIS — N186 End stage renal disease: Secondary | ICD-10-CM | POA: Diagnosis not present

## 2020-02-12 DIAGNOSIS — Z992 Dependence on renal dialysis: Secondary | ICD-10-CM | POA: Diagnosis not present

## 2020-02-14 DIAGNOSIS — N2581 Secondary hyperparathyroidism of renal origin: Secondary | ICD-10-CM | POA: Diagnosis not present

## 2020-02-14 DIAGNOSIS — Z992 Dependence on renal dialysis: Secondary | ICD-10-CM | POA: Diagnosis not present

## 2020-02-14 DIAGNOSIS — N186 End stage renal disease: Secondary | ICD-10-CM | POA: Diagnosis not present

## 2020-02-16 DIAGNOSIS — N186 End stage renal disease: Secondary | ICD-10-CM | POA: Diagnosis not present

## 2020-02-16 DIAGNOSIS — N2581 Secondary hyperparathyroidism of renal origin: Secondary | ICD-10-CM | POA: Diagnosis not present

## 2020-02-16 DIAGNOSIS — Z992 Dependence on renal dialysis: Secondary | ICD-10-CM | POA: Diagnosis not present

## 2020-02-19 DIAGNOSIS — N2581 Secondary hyperparathyroidism of renal origin: Secondary | ICD-10-CM | POA: Diagnosis not present

## 2020-02-19 DIAGNOSIS — Z992 Dependence on renal dialysis: Secondary | ICD-10-CM | POA: Diagnosis not present

## 2020-02-19 DIAGNOSIS — N186 End stage renal disease: Secondary | ICD-10-CM | POA: Diagnosis not present

## 2020-02-21 DIAGNOSIS — Z992 Dependence on renal dialysis: Secondary | ICD-10-CM | POA: Diagnosis not present

## 2020-02-21 DIAGNOSIS — E1142 Type 2 diabetes mellitus with diabetic polyneuropathy: Secondary | ICD-10-CM | POA: Diagnosis not present

## 2020-02-21 DIAGNOSIS — L97421 Non-pressure chronic ulcer of left heel and midfoot limited to breakdown of skin: Secondary | ICD-10-CM | POA: Diagnosis not present

## 2020-02-21 DIAGNOSIS — N186 End stage renal disease: Secondary | ICD-10-CM | POA: Diagnosis not present

## 2020-02-21 DIAGNOSIS — N2581 Secondary hyperparathyroidism of renal origin: Secondary | ICD-10-CM | POA: Diagnosis not present

## 2020-02-23 DIAGNOSIS — N2581 Secondary hyperparathyroidism of renal origin: Secondary | ICD-10-CM | POA: Diagnosis not present

## 2020-02-23 DIAGNOSIS — Z992 Dependence on renal dialysis: Secondary | ICD-10-CM | POA: Diagnosis not present

## 2020-02-23 DIAGNOSIS — N186 End stage renal disease: Secondary | ICD-10-CM | POA: Diagnosis not present

## 2020-02-26 DIAGNOSIS — N186 End stage renal disease: Secondary | ICD-10-CM | POA: Diagnosis not present

## 2020-02-26 DIAGNOSIS — Z992 Dependence on renal dialysis: Secondary | ICD-10-CM | POA: Diagnosis not present

## 2020-02-26 DIAGNOSIS — N2581 Secondary hyperparathyroidism of renal origin: Secondary | ICD-10-CM | POA: Diagnosis not present

## 2020-02-28 DIAGNOSIS — N186 End stage renal disease: Secondary | ICD-10-CM | POA: Diagnosis not present

## 2020-02-28 DIAGNOSIS — Z992 Dependence on renal dialysis: Secondary | ICD-10-CM | POA: Diagnosis not present

## 2020-02-28 DIAGNOSIS — N2581 Secondary hyperparathyroidism of renal origin: Secondary | ICD-10-CM | POA: Diagnosis not present

## 2020-03-03 DIAGNOSIS — Z992 Dependence on renal dialysis: Secondary | ICD-10-CM | POA: Diagnosis not present

## 2020-03-03 DIAGNOSIS — N186 End stage renal disease: Secondary | ICD-10-CM | POA: Diagnosis not present

## 2020-03-03 DIAGNOSIS — N2581 Secondary hyperparathyroidism of renal origin: Secondary | ICD-10-CM | POA: Diagnosis not present

## 2020-03-04 DIAGNOSIS — N2581 Secondary hyperparathyroidism of renal origin: Secondary | ICD-10-CM | POA: Diagnosis not present

## 2020-03-04 DIAGNOSIS — Z992 Dependence on renal dialysis: Secondary | ICD-10-CM | POA: Diagnosis not present

## 2020-03-04 DIAGNOSIS — N186 End stage renal disease: Secondary | ICD-10-CM | POA: Diagnosis not present

## 2020-03-05 DIAGNOSIS — N186 End stage renal disease: Secondary | ICD-10-CM | POA: Diagnosis not present

## 2020-03-05 DIAGNOSIS — Z992 Dependence on renal dialysis: Secondary | ICD-10-CM | POA: Diagnosis not present

## 2020-03-05 DIAGNOSIS — N041 Nephrotic syndrome with focal and segmental glomerular lesions: Secondary | ICD-10-CM | POA: Diagnosis not present

## 2020-03-06 DIAGNOSIS — N186 End stage renal disease: Secondary | ICD-10-CM | POA: Diagnosis not present

## 2020-03-06 DIAGNOSIS — Z992 Dependence on renal dialysis: Secondary | ICD-10-CM | POA: Diagnosis not present

## 2020-03-06 DIAGNOSIS — N2581 Secondary hyperparathyroidism of renal origin: Secondary | ICD-10-CM | POA: Diagnosis not present

## 2020-03-08 DIAGNOSIS — N186 End stage renal disease: Secondary | ICD-10-CM | POA: Diagnosis not present

## 2020-03-08 DIAGNOSIS — N2581 Secondary hyperparathyroidism of renal origin: Secondary | ICD-10-CM | POA: Diagnosis not present

## 2020-03-08 DIAGNOSIS — Z992 Dependence on renal dialysis: Secondary | ICD-10-CM | POA: Diagnosis not present

## 2020-03-11 DIAGNOSIS — N2581 Secondary hyperparathyroidism of renal origin: Secondary | ICD-10-CM | POA: Diagnosis not present

## 2020-03-11 DIAGNOSIS — Z992 Dependence on renal dialysis: Secondary | ICD-10-CM | POA: Diagnosis not present

## 2020-03-11 DIAGNOSIS — N186 End stage renal disease: Secondary | ICD-10-CM | POA: Diagnosis not present

## 2020-03-13 DIAGNOSIS — N186 End stage renal disease: Secondary | ICD-10-CM | POA: Diagnosis not present

## 2020-03-13 DIAGNOSIS — N2581 Secondary hyperparathyroidism of renal origin: Secondary | ICD-10-CM | POA: Diagnosis not present

## 2020-03-13 DIAGNOSIS — E1142 Type 2 diabetes mellitus with diabetic polyneuropathy: Secondary | ICD-10-CM | POA: Diagnosis not present

## 2020-03-13 DIAGNOSIS — L97421 Non-pressure chronic ulcer of left heel and midfoot limited to breakdown of skin: Secondary | ICD-10-CM | POA: Diagnosis not present

## 2020-03-13 DIAGNOSIS — Z992 Dependence on renal dialysis: Secondary | ICD-10-CM | POA: Diagnosis not present

## 2020-03-15 DIAGNOSIS — N186 End stage renal disease: Secondary | ICD-10-CM | POA: Diagnosis not present

## 2020-03-15 DIAGNOSIS — Z992 Dependence on renal dialysis: Secondary | ICD-10-CM | POA: Diagnosis not present

## 2020-03-15 DIAGNOSIS — N2581 Secondary hyperparathyroidism of renal origin: Secondary | ICD-10-CM | POA: Diagnosis not present

## 2020-03-18 DIAGNOSIS — Z992 Dependence on renal dialysis: Secondary | ICD-10-CM | POA: Diagnosis not present

## 2020-03-18 DIAGNOSIS — N2581 Secondary hyperparathyroidism of renal origin: Secondary | ICD-10-CM | POA: Diagnosis not present

## 2020-03-18 DIAGNOSIS — N186 End stage renal disease: Secondary | ICD-10-CM | POA: Diagnosis not present

## 2020-03-20 DIAGNOSIS — Z992 Dependence on renal dialysis: Secondary | ICD-10-CM | POA: Diagnosis not present

## 2020-03-20 DIAGNOSIS — N186 End stage renal disease: Secondary | ICD-10-CM | POA: Diagnosis not present

## 2020-03-20 DIAGNOSIS — N2581 Secondary hyperparathyroidism of renal origin: Secondary | ICD-10-CM | POA: Diagnosis not present

## 2020-03-22 DIAGNOSIS — N186 End stage renal disease: Secondary | ICD-10-CM | POA: Diagnosis not present

## 2020-03-22 DIAGNOSIS — Z992 Dependence on renal dialysis: Secondary | ICD-10-CM | POA: Diagnosis not present

## 2020-03-22 DIAGNOSIS — N2581 Secondary hyperparathyroidism of renal origin: Secondary | ICD-10-CM | POA: Diagnosis not present

## 2020-03-25 DIAGNOSIS — N186 End stage renal disease: Secondary | ICD-10-CM | POA: Diagnosis not present

## 2020-03-25 DIAGNOSIS — Z992 Dependence on renal dialysis: Secondary | ICD-10-CM | POA: Diagnosis not present

## 2020-03-25 DIAGNOSIS — N2581 Secondary hyperparathyroidism of renal origin: Secondary | ICD-10-CM | POA: Diagnosis not present

## 2020-03-27 DIAGNOSIS — Z992 Dependence on renal dialysis: Secondary | ICD-10-CM | POA: Diagnosis not present

## 2020-03-27 DIAGNOSIS — N2581 Secondary hyperparathyroidism of renal origin: Secondary | ICD-10-CM | POA: Diagnosis not present

## 2020-03-27 DIAGNOSIS — E118 Type 2 diabetes mellitus with unspecified complications: Secondary | ICD-10-CM | POA: Diagnosis not present

## 2020-03-27 DIAGNOSIS — N186 End stage renal disease: Secondary | ICD-10-CM | POA: Diagnosis not present

## 2020-03-29 DIAGNOSIS — N186 End stage renal disease: Secondary | ICD-10-CM | POA: Diagnosis not present

## 2020-03-29 DIAGNOSIS — N2581 Secondary hyperparathyroidism of renal origin: Secondary | ICD-10-CM | POA: Diagnosis not present

## 2020-03-29 DIAGNOSIS — Z992 Dependence on renal dialysis: Secondary | ICD-10-CM | POA: Diagnosis not present

## 2020-04-01 DIAGNOSIS — N2581 Secondary hyperparathyroidism of renal origin: Secondary | ICD-10-CM | POA: Diagnosis not present

## 2020-04-01 DIAGNOSIS — Z992 Dependence on renal dialysis: Secondary | ICD-10-CM | POA: Diagnosis not present

## 2020-04-01 DIAGNOSIS — N186 End stage renal disease: Secondary | ICD-10-CM | POA: Diagnosis not present

## 2020-04-03 DIAGNOSIS — M79672 Pain in left foot: Secondary | ICD-10-CM | POA: Diagnosis not present

## 2020-04-03 DIAGNOSIS — L97421 Non-pressure chronic ulcer of left heel and midfoot limited to breakdown of skin: Secondary | ICD-10-CM | POA: Diagnosis not present

## 2020-04-03 DIAGNOSIS — N186 End stage renal disease: Secondary | ICD-10-CM | POA: Diagnosis not present

## 2020-04-03 DIAGNOSIS — Z992 Dependence on renal dialysis: Secondary | ICD-10-CM | POA: Diagnosis not present

## 2020-04-03 DIAGNOSIS — N2581 Secondary hyperparathyroidism of renal origin: Secondary | ICD-10-CM | POA: Diagnosis not present

## 2020-04-04 DIAGNOSIS — N186 End stage renal disease: Secondary | ICD-10-CM | POA: Diagnosis not present

## 2020-04-04 DIAGNOSIS — N041 Nephrotic syndrome with focal and segmental glomerular lesions: Secondary | ICD-10-CM | POA: Diagnosis not present

## 2020-04-04 DIAGNOSIS — Z992 Dependence on renal dialysis: Secondary | ICD-10-CM | POA: Diagnosis not present

## 2020-04-05 DIAGNOSIS — N186 End stage renal disease: Secondary | ICD-10-CM | POA: Diagnosis not present

## 2020-04-05 DIAGNOSIS — N2581 Secondary hyperparathyroidism of renal origin: Secondary | ICD-10-CM | POA: Diagnosis not present

## 2020-04-05 DIAGNOSIS — Z992 Dependence on renal dialysis: Secondary | ICD-10-CM | POA: Diagnosis not present

## 2020-04-08 DIAGNOSIS — N2581 Secondary hyperparathyroidism of renal origin: Secondary | ICD-10-CM | POA: Diagnosis not present

## 2020-04-08 DIAGNOSIS — N186 End stage renal disease: Secondary | ICD-10-CM | POA: Diagnosis not present

## 2020-04-08 DIAGNOSIS — Z992 Dependence on renal dialysis: Secondary | ICD-10-CM | POA: Diagnosis not present

## 2020-04-10 DIAGNOSIS — Z992 Dependence on renal dialysis: Secondary | ICD-10-CM | POA: Diagnosis not present

## 2020-04-10 DIAGNOSIS — N186 End stage renal disease: Secondary | ICD-10-CM | POA: Diagnosis not present

## 2020-04-10 DIAGNOSIS — N2581 Secondary hyperparathyroidism of renal origin: Secondary | ICD-10-CM | POA: Diagnosis not present

## 2020-04-12 DIAGNOSIS — N2581 Secondary hyperparathyroidism of renal origin: Secondary | ICD-10-CM | POA: Diagnosis not present

## 2020-04-12 DIAGNOSIS — N186 End stage renal disease: Secondary | ICD-10-CM | POA: Diagnosis not present

## 2020-04-12 DIAGNOSIS — Z992 Dependence on renal dialysis: Secondary | ICD-10-CM | POA: Diagnosis not present

## 2020-04-15 DIAGNOSIS — N186 End stage renal disease: Secondary | ICD-10-CM | POA: Diagnosis not present

## 2020-04-15 DIAGNOSIS — Z992 Dependence on renal dialysis: Secondary | ICD-10-CM | POA: Diagnosis not present

## 2020-04-15 DIAGNOSIS — N2581 Secondary hyperparathyroidism of renal origin: Secondary | ICD-10-CM | POA: Diagnosis not present

## 2020-04-17 DIAGNOSIS — N2581 Secondary hyperparathyroidism of renal origin: Secondary | ICD-10-CM | POA: Diagnosis not present

## 2020-04-17 DIAGNOSIS — Z992 Dependence on renal dialysis: Secondary | ICD-10-CM | POA: Diagnosis not present

## 2020-04-17 DIAGNOSIS — N186 End stage renal disease: Secondary | ICD-10-CM | POA: Diagnosis not present

## 2020-04-19 DIAGNOSIS — Z992 Dependence on renal dialysis: Secondary | ICD-10-CM | POA: Diagnosis not present

## 2020-04-19 DIAGNOSIS — N2581 Secondary hyperparathyroidism of renal origin: Secondary | ICD-10-CM | POA: Diagnosis not present

## 2020-04-19 DIAGNOSIS — N186 End stage renal disease: Secondary | ICD-10-CM | POA: Diagnosis not present

## 2020-04-22 DIAGNOSIS — Z992 Dependence on renal dialysis: Secondary | ICD-10-CM | POA: Diagnosis not present

## 2020-04-22 DIAGNOSIS — N2581 Secondary hyperparathyroidism of renal origin: Secondary | ICD-10-CM | POA: Diagnosis not present

## 2020-04-22 DIAGNOSIS — N186 End stage renal disease: Secondary | ICD-10-CM | POA: Diagnosis not present

## 2020-04-24 DIAGNOSIS — N2581 Secondary hyperparathyroidism of renal origin: Secondary | ICD-10-CM | POA: Diagnosis not present

## 2020-04-24 DIAGNOSIS — Z992 Dependence on renal dialysis: Secondary | ICD-10-CM | POA: Diagnosis not present

## 2020-04-24 DIAGNOSIS — N186 End stage renal disease: Secondary | ICD-10-CM | POA: Diagnosis not present

## 2020-04-25 DIAGNOSIS — I871 Compression of vein: Secondary | ICD-10-CM | POA: Diagnosis not present

## 2020-04-25 DIAGNOSIS — N186 End stage renal disease: Secondary | ICD-10-CM | POA: Diagnosis not present

## 2020-04-25 DIAGNOSIS — Z992 Dependence on renal dialysis: Secondary | ICD-10-CM | POA: Diagnosis not present

## 2020-04-26 DIAGNOSIS — N2581 Secondary hyperparathyroidism of renal origin: Secondary | ICD-10-CM | POA: Diagnosis not present

## 2020-04-26 DIAGNOSIS — N186 End stage renal disease: Secondary | ICD-10-CM | POA: Diagnosis not present

## 2020-04-26 DIAGNOSIS — Z992 Dependence on renal dialysis: Secondary | ICD-10-CM | POA: Diagnosis not present

## 2020-04-29 DIAGNOSIS — N186 End stage renal disease: Secondary | ICD-10-CM | POA: Diagnosis not present

## 2020-04-29 DIAGNOSIS — Z992 Dependence on renal dialysis: Secondary | ICD-10-CM | POA: Diagnosis not present

## 2020-04-29 DIAGNOSIS — N2581 Secondary hyperparathyroidism of renal origin: Secondary | ICD-10-CM | POA: Diagnosis not present

## 2020-05-01 DIAGNOSIS — N2581 Secondary hyperparathyroidism of renal origin: Secondary | ICD-10-CM | POA: Diagnosis not present

## 2020-05-01 DIAGNOSIS — Z992 Dependence on renal dialysis: Secondary | ICD-10-CM | POA: Diagnosis not present

## 2020-05-01 DIAGNOSIS — N186 End stage renal disease: Secondary | ICD-10-CM | POA: Diagnosis not present

## 2020-05-01 DIAGNOSIS — L97521 Non-pressure chronic ulcer of other part of left foot limited to breakdown of skin: Secondary | ICD-10-CM | POA: Diagnosis not present

## 2020-05-03 DIAGNOSIS — Z992 Dependence on renal dialysis: Secondary | ICD-10-CM | POA: Diagnosis not present

## 2020-05-03 DIAGNOSIS — N186 End stage renal disease: Secondary | ICD-10-CM | POA: Diagnosis not present

## 2020-05-03 DIAGNOSIS — N2581 Secondary hyperparathyroidism of renal origin: Secondary | ICD-10-CM | POA: Diagnosis not present

## 2020-05-05 DIAGNOSIS — N186 End stage renal disease: Secondary | ICD-10-CM | POA: Diagnosis not present

## 2020-05-05 DIAGNOSIS — N041 Nephrotic syndrome with focal and segmental glomerular lesions: Secondary | ICD-10-CM | POA: Diagnosis not present

## 2020-05-05 DIAGNOSIS — Z992 Dependence on renal dialysis: Secondary | ICD-10-CM | POA: Diagnosis not present

## 2020-05-06 DIAGNOSIS — N186 End stage renal disease: Secondary | ICD-10-CM | POA: Diagnosis not present

## 2020-05-06 DIAGNOSIS — Z992 Dependence on renal dialysis: Secondary | ICD-10-CM | POA: Diagnosis not present

## 2020-05-06 DIAGNOSIS — N2581 Secondary hyperparathyroidism of renal origin: Secondary | ICD-10-CM | POA: Diagnosis not present

## 2020-05-08 DIAGNOSIS — N2581 Secondary hyperparathyroidism of renal origin: Secondary | ICD-10-CM | POA: Diagnosis not present

## 2020-05-08 DIAGNOSIS — N186 End stage renal disease: Secondary | ICD-10-CM | POA: Diagnosis not present

## 2020-05-08 DIAGNOSIS — Z992 Dependence on renal dialysis: Secondary | ICD-10-CM | POA: Diagnosis not present

## 2020-05-10 DIAGNOSIS — N186 End stage renal disease: Secondary | ICD-10-CM | POA: Diagnosis not present

## 2020-05-10 DIAGNOSIS — N2581 Secondary hyperparathyroidism of renal origin: Secondary | ICD-10-CM | POA: Diagnosis not present

## 2020-05-10 DIAGNOSIS — Z992 Dependence on renal dialysis: Secondary | ICD-10-CM | POA: Diagnosis not present

## 2020-05-13 DIAGNOSIS — N186 End stage renal disease: Secondary | ICD-10-CM | POA: Diagnosis not present

## 2020-05-13 DIAGNOSIS — Z992 Dependence on renal dialysis: Secondary | ICD-10-CM | POA: Diagnosis not present

## 2020-05-13 DIAGNOSIS — N2581 Secondary hyperparathyroidism of renal origin: Secondary | ICD-10-CM | POA: Diagnosis not present

## 2020-05-15 DIAGNOSIS — Z992 Dependence on renal dialysis: Secondary | ICD-10-CM | POA: Diagnosis not present

## 2020-05-15 DIAGNOSIS — N2581 Secondary hyperparathyroidism of renal origin: Secondary | ICD-10-CM | POA: Diagnosis not present

## 2020-05-15 DIAGNOSIS — N186 End stage renal disease: Secondary | ICD-10-CM | POA: Diagnosis not present

## 2020-05-17 DIAGNOSIS — N186 End stage renal disease: Secondary | ICD-10-CM | POA: Diagnosis not present

## 2020-05-17 DIAGNOSIS — N2581 Secondary hyperparathyroidism of renal origin: Secondary | ICD-10-CM | POA: Diagnosis not present

## 2020-05-17 DIAGNOSIS — Z992 Dependence on renal dialysis: Secondary | ICD-10-CM | POA: Diagnosis not present

## 2020-05-20 DIAGNOSIS — N2581 Secondary hyperparathyroidism of renal origin: Secondary | ICD-10-CM | POA: Diagnosis not present

## 2020-05-20 DIAGNOSIS — Z992 Dependence on renal dialysis: Secondary | ICD-10-CM | POA: Diagnosis not present

## 2020-05-20 DIAGNOSIS — N186 End stage renal disease: Secondary | ICD-10-CM | POA: Diagnosis not present

## 2020-05-22 DIAGNOSIS — N186 End stage renal disease: Secondary | ICD-10-CM | POA: Diagnosis not present

## 2020-05-22 DIAGNOSIS — N2581 Secondary hyperparathyroidism of renal origin: Secondary | ICD-10-CM | POA: Diagnosis not present

## 2020-05-22 DIAGNOSIS — Z992 Dependence on renal dialysis: Secondary | ICD-10-CM | POA: Diagnosis not present

## 2020-05-24 DIAGNOSIS — N186 End stage renal disease: Secondary | ICD-10-CM | POA: Diagnosis not present

## 2020-05-24 DIAGNOSIS — N2581 Secondary hyperparathyroidism of renal origin: Secondary | ICD-10-CM | POA: Diagnosis not present

## 2020-05-24 DIAGNOSIS — Z992 Dependence on renal dialysis: Secondary | ICD-10-CM | POA: Diagnosis not present

## 2020-05-27 DIAGNOSIS — N2581 Secondary hyperparathyroidism of renal origin: Secondary | ICD-10-CM | POA: Diagnosis not present

## 2020-05-27 DIAGNOSIS — Z992 Dependence on renal dialysis: Secondary | ICD-10-CM | POA: Diagnosis not present

## 2020-05-27 DIAGNOSIS — N186 End stage renal disease: Secondary | ICD-10-CM | POA: Diagnosis not present

## 2020-05-29 DIAGNOSIS — N186 End stage renal disease: Secondary | ICD-10-CM | POA: Diagnosis not present

## 2020-05-29 DIAGNOSIS — N2581 Secondary hyperparathyroidism of renal origin: Secondary | ICD-10-CM | POA: Diagnosis not present

## 2020-05-29 DIAGNOSIS — Z992 Dependence on renal dialysis: Secondary | ICD-10-CM | POA: Diagnosis not present

## 2020-05-31 DIAGNOSIS — N186 End stage renal disease: Secondary | ICD-10-CM | POA: Diagnosis not present

## 2020-05-31 DIAGNOSIS — N2581 Secondary hyperparathyroidism of renal origin: Secondary | ICD-10-CM | POA: Diagnosis not present

## 2020-05-31 DIAGNOSIS — Z992 Dependence on renal dialysis: Secondary | ICD-10-CM | POA: Diagnosis not present

## 2020-06-03 DIAGNOSIS — Z992 Dependence on renal dialysis: Secondary | ICD-10-CM | POA: Diagnosis not present

## 2020-06-03 DIAGNOSIS — N2581 Secondary hyperparathyroidism of renal origin: Secondary | ICD-10-CM | POA: Diagnosis not present

## 2020-06-03 DIAGNOSIS — N186 End stage renal disease: Secondary | ICD-10-CM | POA: Diagnosis not present

## 2020-06-04 DIAGNOSIS — N041 Nephrotic syndrome with focal and segmental glomerular lesions: Secondary | ICD-10-CM | POA: Diagnosis not present

## 2020-06-04 DIAGNOSIS — Z992 Dependence on renal dialysis: Secondary | ICD-10-CM | POA: Diagnosis not present

## 2020-06-04 DIAGNOSIS — N186 End stage renal disease: Secondary | ICD-10-CM | POA: Diagnosis not present

## 2020-06-05 DIAGNOSIS — N2581 Secondary hyperparathyroidism of renal origin: Secondary | ICD-10-CM | POA: Diagnosis not present

## 2020-06-05 DIAGNOSIS — Z992 Dependence on renal dialysis: Secondary | ICD-10-CM | POA: Diagnosis not present

## 2020-06-05 DIAGNOSIS — N186 End stage renal disease: Secondary | ICD-10-CM | POA: Diagnosis not present

## 2020-06-07 DIAGNOSIS — N186 End stage renal disease: Secondary | ICD-10-CM | POA: Diagnosis not present

## 2020-06-07 DIAGNOSIS — N2581 Secondary hyperparathyroidism of renal origin: Secondary | ICD-10-CM | POA: Diagnosis not present

## 2020-06-07 DIAGNOSIS — Z992 Dependence on renal dialysis: Secondary | ICD-10-CM | POA: Diagnosis not present

## 2020-06-10 DIAGNOSIS — N186 End stage renal disease: Secondary | ICD-10-CM | POA: Diagnosis not present

## 2020-06-10 DIAGNOSIS — Z992 Dependence on renal dialysis: Secondary | ICD-10-CM | POA: Diagnosis not present

## 2020-06-10 DIAGNOSIS — N2581 Secondary hyperparathyroidism of renal origin: Secondary | ICD-10-CM | POA: Diagnosis not present

## 2020-06-12 DIAGNOSIS — N186 End stage renal disease: Secondary | ICD-10-CM | POA: Diagnosis not present

## 2020-06-12 DIAGNOSIS — N2581 Secondary hyperparathyroidism of renal origin: Secondary | ICD-10-CM | POA: Diagnosis not present

## 2020-06-12 DIAGNOSIS — Z992 Dependence on renal dialysis: Secondary | ICD-10-CM | POA: Diagnosis not present

## 2020-06-14 DIAGNOSIS — N186 End stage renal disease: Secondary | ICD-10-CM | POA: Diagnosis not present

## 2020-06-14 DIAGNOSIS — N2581 Secondary hyperparathyroidism of renal origin: Secondary | ICD-10-CM | POA: Diagnosis not present

## 2020-06-14 DIAGNOSIS — Z992 Dependence on renal dialysis: Secondary | ICD-10-CM | POA: Diagnosis not present

## 2020-06-17 DIAGNOSIS — N186 End stage renal disease: Secondary | ICD-10-CM | POA: Diagnosis not present

## 2020-06-17 DIAGNOSIS — Z992 Dependence on renal dialysis: Secondary | ICD-10-CM | POA: Diagnosis not present

## 2020-06-17 DIAGNOSIS — N2581 Secondary hyperparathyroidism of renal origin: Secondary | ICD-10-CM | POA: Diagnosis not present

## 2020-06-19 DIAGNOSIS — N186 End stage renal disease: Secondary | ICD-10-CM | POA: Diagnosis not present

## 2020-06-19 DIAGNOSIS — N2581 Secondary hyperparathyroidism of renal origin: Secondary | ICD-10-CM | POA: Diagnosis not present

## 2020-06-19 DIAGNOSIS — Z992 Dependence on renal dialysis: Secondary | ICD-10-CM | POA: Diagnosis not present

## 2020-06-21 DIAGNOSIS — N2581 Secondary hyperparathyroidism of renal origin: Secondary | ICD-10-CM | POA: Diagnosis not present

## 2020-06-21 DIAGNOSIS — Z992 Dependence on renal dialysis: Secondary | ICD-10-CM | POA: Diagnosis not present

## 2020-06-21 DIAGNOSIS — N186 End stage renal disease: Secondary | ICD-10-CM | POA: Diagnosis not present

## 2020-06-24 DIAGNOSIS — N186 End stage renal disease: Secondary | ICD-10-CM | POA: Diagnosis not present

## 2020-06-24 DIAGNOSIS — Z992 Dependence on renal dialysis: Secondary | ICD-10-CM | POA: Diagnosis not present

## 2020-06-24 DIAGNOSIS — N2581 Secondary hyperparathyroidism of renal origin: Secondary | ICD-10-CM | POA: Diagnosis not present

## 2020-06-26 DIAGNOSIS — Z992 Dependence on renal dialysis: Secondary | ICD-10-CM | POA: Diagnosis not present

## 2020-06-26 DIAGNOSIS — N186 End stage renal disease: Secondary | ICD-10-CM | POA: Diagnosis not present

## 2020-06-26 DIAGNOSIS — E118 Type 2 diabetes mellitus with unspecified complications: Secondary | ICD-10-CM | POA: Diagnosis not present

## 2020-06-26 DIAGNOSIS — N2581 Secondary hyperparathyroidism of renal origin: Secondary | ICD-10-CM | POA: Diagnosis not present

## 2020-06-28 DIAGNOSIS — N2581 Secondary hyperparathyroidism of renal origin: Secondary | ICD-10-CM | POA: Diagnosis not present

## 2020-06-28 DIAGNOSIS — N186 End stage renal disease: Secondary | ICD-10-CM | POA: Diagnosis not present

## 2020-06-28 DIAGNOSIS — Z992 Dependence on renal dialysis: Secondary | ICD-10-CM | POA: Diagnosis not present

## 2020-07-01 DIAGNOSIS — Z992 Dependence on renal dialysis: Secondary | ICD-10-CM | POA: Diagnosis not present

## 2020-07-01 DIAGNOSIS — N186 End stage renal disease: Secondary | ICD-10-CM | POA: Diagnosis not present

## 2020-07-01 DIAGNOSIS — N2581 Secondary hyperparathyroidism of renal origin: Secondary | ICD-10-CM | POA: Diagnosis not present

## 2020-07-03 DIAGNOSIS — Z992 Dependence on renal dialysis: Secondary | ICD-10-CM | POA: Diagnosis not present

## 2020-07-03 DIAGNOSIS — L97421 Non-pressure chronic ulcer of left heel and midfoot limited to breakdown of skin: Secondary | ICD-10-CM | POA: Diagnosis not present

## 2020-07-03 DIAGNOSIS — N186 End stage renal disease: Secondary | ICD-10-CM | POA: Diagnosis not present

## 2020-07-03 DIAGNOSIS — N2581 Secondary hyperparathyroidism of renal origin: Secondary | ICD-10-CM | POA: Diagnosis not present

## 2020-07-05 DIAGNOSIS — Z992 Dependence on renal dialysis: Secondary | ICD-10-CM | POA: Diagnosis not present

## 2020-07-05 DIAGNOSIS — N186 End stage renal disease: Secondary | ICD-10-CM | POA: Diagnosis not present

## 2020-07-05 DIAGNOSIS — N2581 Secondary hyperparathyroidism of renal origin: Secondary | ICD-10-CM | POA: Diagnosis not present

## 2020-07-08 DIAGNOSIS — N2581 Secondary hyperparathyroidism of renal origin: Secondary | ICD-10-CM | POA: Diagnosis not present

## 2020-07-08 DIAGNOSIS — Z992 Dependence on renal dialysis: Secondary | ICD-10-CM | POA: Diagnosis not present

## 2020-07-08 DIAGNOSIS — N186 End stage renal disease: Secondary | ICD-10-CM | POA: Diagnosis not present

## 2020-07-08 DIAGNOSIS — Z23 Encounter for immunization: Secondary | ICD-10-CM | POA: Diagnosis not present

## 2020-07-10 DIAGNOSIS — Z992 Dependence on renal dialysis: Secondary | ICD-10-CM | POA: Diagnosis not present

## 2020-07-10 DIAGNOSIS — N186 End stage renal disease: Secondary | ICD-10-CM | POA: Diagnosis not present

## 2020-07-10 DIAGNOSIS — N2581 Secondary hyperparathyroidism of renal origin: Secondary | ICD-10-CM | POA: Diagnosis not present

## 2020-07-12 DIAGNOSIS — N2581 Secondary hyperparathyroidism of renal origin: Secondary | ICD-10-CM | POA: Diagnosis not present

## 2020-07-12 DIAGNOSIS — N186 End stage renal disease: Secondary | ICD-10-CM | POA: Diagnosis not present

## 2020-07-12 DIAGNOSIS — Z992 Dependence on renal dialysis: Secondary | ICD-10-CM | POA: Diagnosis not present

## 2020-07-15 DIAGNOSIS — N186 End stage renal disease: Secondary | ICD-10-CM | POA: Diagnosis not present

## 2020-07-15 DIAGNOSIS — Z992 Dependence on renal dialysis: Secondary | ICD-10-CM | POA: Diagnosis not present

## 2020-07-15 DIAGNOSIS — N2581 Secondary hyperparathyroidism of renal origin: Secondary | ICD-10-CM | POA: Diagnosis not present

## 2020-07-17 DIAGNOSIS — N186 End stage renal disease: Secondary | ICD-10-CM | POA: Diagnosis not present

## 2020-07-17 DIAGNOSIS — N2581 Secondary hyperparathyroidism of renal origin: Secondary | ICD-10-CM | POA: Diagnosis not present

## 2020-07-17 DIAGNOSIS — Z992 Dependence on renal dialysis: Secondary | ICD-10-CM | POA: Diagnosis not present

## 2020-07-19 DIAGNOSIS — Z992 Dependence on renal dialysis: Secondary | ICD-10-CM | POA: Diagnosis not present

## 2020-07-19 DIAGNOSIS — N186 End stage renal disease: Secondary | ICD-10-CM | POA: Diagnosis not present

## 2020-07-19 DIAGNOSIS — N2581 Secondary hyperparathyroidism of renal origin: Secondary | ICD-10-CM | POA: Diagnosis not present

## 2020-07-22 DIAGNOSIS — N186 End stage renal disease: Secondary | ICD-10-CM | POA: Diagnosis not present

## 2020-07-22 DIAGNOSIS — N2581 Secondary hyperparathyroidism of renal origin: Secondary | ICD-10-CM | POA: Diagnosis not present

## 2020-07-22 DIAGNOSIS — Z992 Dependence on renal dialysis: Secondary | ICD-10-CM | POA: Diagnosis not present

## 2020-07-24 DIAGNOSIS — N186 End stage renal disease: Secondary | ICD-10-CM | POA: Diagnosis not present

## 2020-07-24 DIAGNOSIS — Z992 Dependence on renal dialysis: Secondary | ICD-10-CM | POA: Diagnosis not present

## 2020-07-24 DIAGNOSIS — N2581 Secondary hyperparathyroidism of renal origin: Secondary | ICD-10-CM | POA: Diagnosis not present

## 2020-07-26 DIAGNOSIS — N186 End stage renal disease: Secondary | ICD-10-CM | POA: Diagnosis not present

## 2020-07-26 DIAGNOSIS — Z992 Dependence on renal dialysis: Secondary | ICD-10-CM | POA: Diagnosis not present

## 2020-07-26 DIAGNOSIS — N2581 Secondary hyperparathyroidism of renal origin: Secondary | ICD-10-CM | POA: Diagnosis not present

## 2020-07-29 DIAGNOSIS — N2581 Secondary hyperparathyroidism of renal origin: Secondary | ICD-10-CM | POA: Diagnosis not present

## 2020-07-29 DIAGNOSIS — N186 End stage renal disease: Secondary | ICD-10-CM | POA: Diagnosis not present

## 2020-07-29 DIAGNOSIS — Z992 Dependence on renal dialysis: Secondary | ICD-10-CM | POA: Diagnosis not present

## 2020-07-31 DIAGNOSIS — Z992 Dependence on renal dialysis: Secondary | ICD-10-CM | POA: Diagnosis not present

## 2020-07-31 DIAGNOSIS — N2581 Secondary hyperparathyroidism of renal origin: Secondary | ICD-10-CM | POA: Diagnosis not present

## 2020-07-31 DIAGNOSIS — N186 End stage renal disease: Secondary | ICD-10-CM | POA: Diagnosis not present

## 2020-08-02 DIAGNOSIS — N2581 Secondary hyperparathyroidism of renal origin: Secondary | ICD-10-CM | POA: Diagnosis not present

## 2020-08-02 DIAGNOSIS — N186 End stage renal disease: Secondary | ICD-10-CM | POA: Diagnosis not present

## 2020-08-02 DIAGNOSIS — Z992 Dependence on renal dialysis: Secondary | ICD-10-CM | POA: Diagnosis not present

## 2020-08-05 DIAGNOSIS — N186 End stage renal disease: Secondary | ICD-10-CM | POA: Diagnosis not present

## 2020-08-05 DIAGNOSIS — N2581 Secondary hyperparathyroidism of renal origin: Secondary | ICD-10-CM | POA: Diagnosis not present

## 2020-08-05 DIAGNOSIS — N041 Nephrotic syndrome with focal and segmental glomerular lesions: Secondary | ICD-10-CM | POA: Diagnosis not present

## 2020-08-05 DIAGNOSIS — Z992 Dependence on renal dialysis: Secondary | ICD-10-CM | POA: Diagnosis not present

## 2020-08-05 DIAGNOSIS — Z23 Encounter for immunization: Secondary | ICD-10-CM | POA: Diagnosis not present

## 2020-08-07 DIAGNOSIS — Z992 Dependence on renal dialysis: Secondary | ICD-10-CM | POA: Diagnosis not present

## 2020-08-07 DIAGNOSIS — N186 End stage renal disease: Secondary | ICD-10-CM | POA: Diagnosis not present

## 2020-08-07 DIAGNOSIS — N2581 Secondary hyperparathyroidism of renal origin: Secondary | ICD-10-CM | POA: Diagnosis not present

## 2020-08-07 DIAGNOSIS — L97421 Non-pressure chronic ulcer of left heel and midfoot limited to breakdown of skin: Secondary | ICD-10-CM | POA: Diagnosis not present

## 2020-08-09 DIAGNOSIS — N186 End stage renal disease: Secondary | ICD-10-CM | POA: Diagnosis not present

## 2020-08-09 DIAGNOSIS — N2581 Secondary hyperparathyroidism of renal origin: Secondary | ICD-10-CM | POA: Diagnosis not present

## 2020-08-09 DIAGNOSIS — Z992 Dependence on renal dialysis: Secondary | ICD-10-CM | POA: Diagnosis not present

## 2020-08-12 DIAGNOSIS — N186 End stage renal disease: Secondary | ICD-10-CM | POA: Diagnosis not present

## 2020-08-12 DIAGNOSIS — Z992 Dependence on renal dialysis: Secondary | ICD-10-CM | POA: Diagnosis not present

## 2020-08-12 DIAGNOSIS — N2581 Secondary hyperparathyroidism of renal origin: Secondary | ICD-10-CM | POA: Diagnosis not present

## 2020-08-14 DIAGNOSIS — N2581 Secondary hyperparathyroidism of renal origin: Secondary | ICD-10-CM | POA: Diagnosis not present

## 2020-08-14 DIAGNOSIS — N186 End stage renal disease: Secondary | ICD-10-CM | POA: Diagnosis not present

## 2020-08-14 DIAGNOSIS — Z992 Dependence on renal dialysis: Secondary | ICD-10-CM | POA: Diagnosis not present

## 2020-08-16 DIAGNOSIS — N186 End stage renal disease: Secondary | ICD-10-CM | POA: Diagnosis not present

## 2020-08-16 DIAGNOSIS — Z992 Dependence on renal dialysis: Secondary | ICD-10-CM | POA: Diagnosis not present

## 2020-08-16 DIAGNOSIS — N2581 Secondary hyperparathyroidism of renal origin: Secondary | ICD-10-CM | POA: Diagnosis not present

## 2020-08-18 DIAGNOSIS — N186 End stage renal disease: Secondary | ICD-10-CM | POA: Diagnosis not present

## 2020-08-18 DIAGNOSIS — I871 Compression of vein: Secondary | ICD-10-CM | POA: Diagnosis not present

## 2020-08-18 DIAGNOSIS — Z992 Dependence on renal dialysis: Secondary | ICD-10-CM | POA: Diagnosis not present

## 2020-08-19 DIAGNOSIS — N186 End stage renal disease: Secondary | ICD-10-CM | POA: Diagnosis not present

## 2020-08-19 DIAGNOSIS — Z992 Dependence on renal dialysis: Secondary | ICD-10-CM | POA: Diagnosis not present

## 2020-08-19 DIAGNOSIS — N2581 Secondary hyperparathyroidism of renal origin: Secondary | ICD-10-CM | POA: Diagnosis not present

## 2020-08-21 DIAGNOSIS — N2581 Secondary hyperparathyroidism of renal origin: Secondary | ICD-10-CM | POA: Diagnosis not present

## 2020-08-21 DIAGNOSIS — N186 End stage renal disease: Secondary | ICD-10-CM | POA: Diagnosis not present

## 2020-08-21 DIAGNOSIS — Z992 Dependence on renal dialysis: Secondary | ICD-10-CM | POA: Diagnosis not present

## 2020-08-23 DIAGNOSIS — N2581 Secondary hyperparathyroidism of renal origin: Secondary | ICD-10-CM | POA: Diagnosis not present

## 2020-08-23 DIAGNOSIS — N186 End stage renal disease: Secondary | ICD-10-CM | POA: Diagnosis not present

## 2020-08-23 DIAGNOSIS — Z992 Dependence on renal dialysis: Secondary | ICD-10-CM | POA: Diagnosis not present

## 2020-08-26 DIAGNOSIS — Z992 Dependence on renal dialysis: Secondary | ICD-10-CM | POA: Diagnosis not present

## 2020-08-26 DIAGNOSIS — N2581 Secondary hyperparathyroidism of renal origin: Secondary | ICD-10-CM | POA: Diagnosis not present

## 2020-08-26 DIAGNOSIS — N186 End stage renal disease: Secondary | ICD-10-CM | POA: Diagnosis not present

## 2020-08-28 DIAGNOSIS — L97521 Non-pressure chronic ulcer of other part of left foot limited to breakdown of skin: Secondary | ICD-10-CM | POA: Diagnosis not present

## 2020-08-28 DIAGNOSIS — Z992 Dependence on renal dialysis: Secondary | ICD-10-CM | POA: Diagnosis not present

## 2020-08-28 DIAGNOSIS — N2581 Secondary hyperparathyroidism of renal origin: Secondary | ICD-10-CM | POA: Diagnosis not present

## 2020-08-28 DIAGNOSIS — N186 End stage renal disease: Secondary | ICD-10-CM | POA: Diagnosis not present

## 2020-08-30 DIAGNOSIS — N2581 Secondary hyperparathyroidism of renal origin: Secondary | ICD-10-CM | POA: Diagnosis not present

## 2020-08-30 DIAGNOSIS — N186 End stage renal disease: Secondary | ICD-10-CM | POA: Diagnosis not present

## 2020-08-30 DIAGNOSIS — Z992 Dependence on renal dialysis: Secondary | ICD-10-CM | POA: Diagnosis not present

## 2020-09-02 DIAGNOSIS — Z992 Dependence on renal dialysis: Secondary | ICD-10-CM | POA: Diagnosis not present

## 2020-09-02 DIAGNOSIS — N2581 Secondary hyperparathyroidism of renal origin: Secondary | ICD-10-CM | POA: Diagnosis not present

## 2020-09-02 DIAGNOSIS — N186 End stage renal disease: Secondary | ICD-10-CM | POA: Diagnosis not present

## 2020-09-04 DIAGNOSIS — N2581 Secondary hyperparathyroidism of renal origin: Secondary | ICD-10-CM | POA: Diagnosis not present

## 2020-09-04 DIAGNOSIS — Z992 Dependence on renal dialysis: Secondary | ICD-10-CM | POA: Diagnosis not present

## 2020-09-04 DIAGNOSIS — N041 Nephrotic syndrome with focal and segmental glomerular lesions: Secondary | ICD-10-CM | POA: Diagnosis not present

## 2020-09-04 DIAGNOSIS — N186 End stage renal disease: Secondary | ICD-10-CM | POA: Diagnosis not present

## 2020-09-06 DIAGNOSIS — N186 End stage renal disease: Secondary | ICD-10-CM | POA: Diagnosis not present

## 2020-09-06 DIAGNOSIS — N2581 Secondary hyperparathyroidism of renal origin: Secondary | ICD-10-CM | POA: Diagnosis not present

## 2020-09-06 DIAGNOSIS — Z992 Dependence on renal dialysis: Secondary | ICD-10-CM | POA: Diagnosis not present

## 2020-09-09 DIAGNOSIS — Z992 Dependence on renal dialysis: Secondary | ICD-10-CM | POA: Diagnosis not present

## 2020-09-09 DIAGNOSIS — N186 End stage renal disease: Secondary | ICD-10-CM | POA: Diagnosis not present

## 2020-09-09 DIAGNOSIS — N2581 Secondary hyperparathyroidism of renal origin: Secondary | ICD-10-CM | POA: Diagnosis not present

## 2020-09-11 DIAGNOSIS — N2581 Secondary hyperparathyroidism of renal origin: Secondary | ICD-10-CM | POA: Diagnosis not present

## 2020-09-11 DIAGNOSIS — N186 End stage renal disease: Secondary | ICD-10-CM | POA: Diagnosis not present

## 2020-09-11 DIAGNOSIS — Z992 Dependence on renal dialysis: Secondary | ICD-10-CM | POA: Diagnosis not present

## 2020-09-13 DIAGNOSIS — Z992 Dependence on renal dialysis: Secondary | ICD-10-CM | POA: Diagnosis not present

## 2020-09-13 DIAGNOSIS — N2581 Secondary hyperparathyroidism of renal origin: Secondary | ICD-10-CM | POA: Diagnosis not present

## 2020-09-13 DIAGNOSIS — N186 End stage renal disease: Secondary | ICD-10-CM | POA: Diagnosis not present

## 2020-09-16 DIAGNOSIS — Z992 Dependence on renal dialysis: Secondary | ICD-10-CM | POA: Diagnosis not present

## 2020-09-16 DIAGNOSIS — N2581 Secondary hyperparathyroidism of renal origin: Secondary | ICD-10-CM | POA: Diagnosis not present

## 2020-09-16 DIAGNOSIS — N186 End stage renal disease: Secondary | ICD-10-CM | POA: Diagnosis not present

## 2020-09-18 DIAGNOSIS — N186 End stage renal disease: Secondary | ICD-10-CM | POA: Diagnosis not present

## 2020-09-18 DIAGNOSIS — Z23 Encounter for immunization: Secondary | ICD-10-CM | POA: Diagnosis not present

## 2020-09-18 DIAGNOSIS — N2581 Secondary hyperparathyroidism of renal origin: Secondary | ICD-10-CM | POA: Diagnosis not present

## 2020-09-18 DIAGNOSIS — L97521 Non-pressure chronic ulcer of other part of left foot limited to breakdown of skin: Secondary | ICD-10-CM | POA: Diagnosis not present

## 2020-09-18 DIAGNOSIS — Z992 Dependence on renal dialysis: Secondary | ICD-10-CM | POA: Diagnosis not present

## 2020-09-20 DIAGNOSIS — N2581 Secondary hyperparathyroidism of renal origin: Secondary | ICD-10-CM | POA: Diagnosis not present

## 2020-09-20 DIAGNOSIS — N186 End stage renal disease: Secondary | ICD-10-CM | POA: Diagnosis not present

## 2020-09-20 DIAGNOSIS — Z992 Dependence on renal dialysis: Secondary | ICD-10-CM | POA: Diagnosis not present

## 2020-09-23 DIAGNOSIS — Z992 Dependence on renal dialysis: Secondary | ICD-10-CM | POA: Diagnosis not present

## 2020-09-23 DIAGNOSIS — N2581 Secondary hyperparathyroidism of renal origin: Secondary | ICD-10-CM | POA: Diagnosis not present

## 2020-09-23 DIAGNOSIS — N186 End stage renal disease: Secondary | ICD-10-CM | POA: Diagnosis not present

## 2020-09-25 DIAGNOSIS — N2581 Secondary hyperparathyroidism of renal origin: Secondary | ICD-10-CM | POA: Diagnosis not present

## 2020-09-25 DIAGNOSIS — Z992 Dependence on renal dialysis: Secondary | ICD-10-CM | POA: Diagnosis not present

## 2020-09-25 DIAGNOSIS — N186 End stage renal disease: Secondary | ICD-10-CM | POA: Diagnosis not present

## 2020-09-27 DIAGNOSIS — Z992 Dependence on renal dialysis: Secondary | ICD-10-CM | POA: Diagnosis not present

## 2020-09-27 DIAGNOSIS — N2581 Secondary hyperparathyroidism of renal origin: Secondary | ICD-10-CM | POA: Diagnosis not present

## 2020-09-27 DIAGNOSIS — N186 End stage renal disease: Secondary | ICD-10-CM | POA: Diagnosis not present

## 2020-09-30 DIAGNOSIS — N186 End stage renal disease: Secondary | ICD-10-CM | POA: Diagnosis not present

## 2020-09-30 DIAGNOSIS — Z992 Dependence on renal dialysis: Secondary | ICD-10-CM | POA: Diagnosis not present

## 2020-09-30 DIAGNOSIS — N2581 Secondary hyperparathyroidism of renal origin: Secondary | ICD-10-CM | POA: Diagnosis not present

## 2020-10-02 DIAGNOSIS — N186 End stage renal disease: Secondary | ICD-10-CM | POA: Diagnosis not present

## 2020-10-02 DIAGNOSIS — N2581 Secondary hyperparathyroidism of renal origin: Secondary | ICD-10-CM | POA: Diagnosis not present

## 2020-10-02 DIAGNOSIS — Z992 Dependence on renal dialysis: Secondary | ICD-10-CM | POA: Diagnosis not present

## 2020-10-04 DIAGNOSIS — N2581 Secondary hyperparathyroidism of renal origin: Secondary | ICD-10-CM | POA: Diagnosis not present

## 2020-10-04 DIAGNOSIS — N186 End stage renal disease: Secondary | ICD-10-CM | POA: Diagnosis not present

## 2020-10-04 DIAGNOSIS — Z992 Dependence on renal dialysis: Secondary | ICD-10-CM | POA: Diagnosis not present

## 2020-10-05 DIAGNOSIS — N186 End stage renal disease: Secondary | ICD-10-CM | POA: Diagnosis not present

## 2020-10-05 DIAGNOSIS — Z992 Dependence on renal dialysis: Secondary | ICD-10-CM | POA: Diagnosis not present

## 2020-10-05 DIAGNOSIS — N041 Nephrotic syndrome with focal and segmental glomerular lesions: Secondary | ICD-10-CM | POA: Diagnosis not present

## 2020-10-07 DIAGNOSIS — Z992 Dependence on renal dialysis: Secondary | ICD-10-CM | POA: Diagnosis not present

## 2020-10-07 DIAGNOSIS — N186 End stage renal disease: Secondary | ICD-10-CM | POA: Diagnosis not present

## 2020-10-07 DIAGNOSIS — N2581 Secondary hyperparathyroidism of renal origin: Secondary | ICD-10-CM | POA: Diagnosis not present

## 2020-10-09 DIAGNOSIS — N2581 Secondary hyperparathyroidism of renal origin: Secondary | ICD-10-CM | POA: Diagnosis not present

## 2020-10-09 DIAGNOSIS — N186 End stage renal disease: Secondary | ICD-10-CM | POA: Diagnosis not present

## 2020-10-09 DIAGNOSIS — Z992 Dependence on renal dialysis: Secondary | ICD-10-CM | POA: Diagnosis not present

## 2020-10-11 DIAGNOSIS — N186 End stage renal disease: Secondary | ICD-10-CM | POA: Diagnosis not present

## 2020-10-11 DIAGNOSIS — Z992 Dependence on renal dialysis: Secondary | ICD-10-CM | POA: Diagnosis not present

## 2020-10-11 DIAGNOSIS — N2581 Secondary hyperparathyroidism of renal origin: Secondary | ICD-10-CM | POA: Diagnosis not present

## 2020-10-14 DIAGNOSIS — Z992 Dependence on renal dialysis: Secondary | ICD-10-CM | POA: Diagnosis not present

## 2020-10-14 DIAGNOSIS — N2581 Secondary hyperparathyroidism of renal origin: Secondary | ICD-10-CM | POA: Diagnosis not present

## 2020-10-14 DIAGNOSIS — N186 End stage renal disease: Secondary | ICD-10-CM | POA: Diagnosis not present

## 2020-10-16 DIAGNOSIS — L97421 Non-pressure chronic ulcer of left heel and midfoot limited to breakdown of skin: Secondary | ICD-10-CM | POA: Diagnosis not present

## 2020-10-16 DIAGNOSIS — N186 End stage renal disease: Secondary | ICD-10-CM | POA: Diagnosis not present

## 2020-10-16 DIAGNOSIS — N2581 Secondary hyperparathyroidism of renal origin: Secondary | ICD-10-CM | POA: Diagnosis not present

## 2020-10-16 DIAGNOSIS — Z992 Dependence on renal dialysis: Secondary | ICD-10-CM | POA: Diagnosis not present

## 2020-10-18 DIAGNOSIS — N2581 Secondary hyperparathyroidism of renal origin: Secondary | ICD-10-CM | POA: Diagnosis not present

## 2020-10-18 DIAGNOSIS — N186 End stage renal disease: Secondary | ICD-10-CM | POA: Diagnosis not present

## 2020-10-18 DIAGNOSIS — Z992 Dependence on renal dialysis: Secondary | ICD-10-CM | POA: Diagnosis not present

## 2020-10-21 DIAGNOSIS — Z992 Dependence on renal dialysis: Secondary | ICD-10-CM | POA: Diagnosis not present

## 2020-10-21 DIAGNOSIS — N186 End stage renal disease: Secondary | ICD-10-CM | POA: Diagnosis not present

## 2020-10-21 DIAGNOSIS — N2581 Secondary hyperparathyroidism of renal origin: Secondary | ICD-10-CM | POA: Diagnosis not present

## 2020-10-23 DIAGNOSIS — Z992 Dependence on renal dialysis: Secondary | ICD-10-CM | POA: Diagnosis not present

## 2020-10-23 DIAGNOSIS — N2581 Secondary hyperparathyroidism of renal origin: Secondary | ICD-10-CM | POA: Diagnosis not present

## 2020-10-23 DIAGNOSIS — N186 End stage renal disease: Secondary | ICD-10-CM | POA: Diagnosis not present

## 2020-10-25 DIAGNOSIS — N2581 Secondary hyperparathyroidism of renal origin: Secondary | ICD-10-CM | POA: Diagnosis not present

## 2020-10-25 DIAGNOSIS — Z992 Dependence on renal dialysis: Secondary | ICD-10-CM | POA: Diagnosis not present

## 2020-10-25 DIAGNOSIS — N186 End stage renal disease: Secondary | ICD-10-CM | POA: Diagnosis not present

## 2020-10-27 DIAGNOSIS — N186 End stage renal disease: Secondary | ICD-10-CM | POA: Diagnosis not present

## 2020-10-27 DIAGNOSIS — N2581 Secondary hyperparathyroidism of renal origin: Secondary | ICD-10-CM | POA: Diagnosis not present

## 2020-10-27 DIAGNOSIS — Z992 Dependence on renal dialysis: Secondary | ICD-10-CM | POA: Diagnosis not present

## 2020-10-29 DIAGNOSIS — N2581 Secondary hyperparathyroidism of renal origin: Secondary | ICD-10-CM | POA: Diagnosis not present

## 2020-10-29 DIAGNOSIS — N186 End stage renal disease: Secondary | ICD-10-CM | POA: Diagnosis not present

## 2020-10-29 DIAGNOSIS — Z992 Dependence on renal dialysis: Secondary | ICD-10-CM | POA: Diagnosis not present

## 2020-11-01 DIAGNOSIS — N2581 Secondary hyperparathyroidism of renal origin: Secondary | ICD-10-CM | POA: Diagnosis not present

## 2020-11-01 DIAGNOSIS — Z992 Dependence on renal dialysis: Secondary | ICD-10-CM | POA: Diagnosis not present

## 2020-11-01 DIAGNOSIS — N186 End stage renal disease: Secondary | ICD-10-CM | POA: Diagnosis not present

## 2020-11-04 DIAGNOSIS — Z992 Dependence on renal dialysis: Secondary | ICD-10-CM | POA: Diagnosis not present

## 2020-11-04 DIAGNOSIS — N2581 Secondary hyperparathyroidism of renal origin: Secondary | ICD-10-CM | POA: Diagnosis not present

## 2020-11-04 DIAGNOSIS — N041 Nephrotic syndrome with focal and segmental glomerular lesions: Secondary | ICD-10-CM | POA: Diagnosis not present

## 2020-11-04 DIAGNOSIS — N186 End stage renal disease: Secondary | ICD-10-CM | POA: Diagnosis not present

## 2020-11-06 DIAGNOSIS — Z992 Dependence on renal dialysis: Secondary | ICD-10-CM | POA: Diagnosis not present

## 2020-11-06 DIAGNOSIS — N2581 Secondary hyperparathyroidism of renal origin: Secondary | ICD-10-CM | POA: Diagnosis not present

## 2020-11-06 DIAGNOSIS — N186 End stage renal disease: Secondary | ICD-10-CM | POA: Diagnosis not present

## 2020-11-08 DIAGNOSIS — N2581 Secondary hyperparathyroidism of renal origin: Secondary | ICD-10-CM | POA: Diagnosis not present

## 2020-11-08 DIAGNOSIS — N186 End stage renal disease: Secondary | ICD-10-CM | POA: Diagnosis not present

## 2020-11-08 DIAGNOSIS — Z992 Dependence on renal dialysis: Secondary | ICD-10-CM | POA: Diagnosis not present

## 2020-11-11 DIAGNOSIS — N186 End stage renal disease: Secondary | ICD-10-CM | POA: Diagnosis not present

## 2020-11-11 DIAGNOSIS — Z992 Dependence on renal dialysis: Secondary | ICD-10-CM | POA: Diagnosis not present

## 2020-11-11 DIAGNOSIS — N2581 Secondary hyperparathyroidism of renal origin: Secondary | ICD-10-CM | POA: Diagnosis not present

## 2020-11-13 DIAGNOSIS — L97421 Non-pressure chronic ulcer of left heel and midfoot limited to breakdown of skin: Secondary | ICD-10-CM | POA: Diagnosis not present

## 2020-11-13 DIAGNOSIS — N2581 Secondary hyperparathyroidism of renal origin: Secondary | ICD-10-CM | POA: Diagnosis not present

## 2020-11-13 DIAGNOSIS — N186 End stage renal disease: Secondary | ICD-10-CM | POA: Diagnosis not present

## 2020-11-13 DIAGNOSIS — Z992 Dependence on renal dialysis: Secondary | ICD-10-CM | POA: Diagnosis not present

## 2020-11-15 DIAGNOSIS — Z992 Dependence on renal dialysis: Secondary | ICD-10-CM | POA: Diagnosis not present

## 2020-11-15 DIAGNOSIS — N2581 Secondary hyperparathyroidism of renal origin: Secondary | ICD-10-CM | POA: Diagnosis not present

## 2020-11-15 DIAGNOSIS — N186 End stage renal disease: Secondary | ICD-10-CM | POA: Diagnosis not present

## 2020-11-18 DIAGNOSIS — N2581 Secondary hyperparathyroidism of renal origin: Secondary | ICD-10-CM | POA: Diagnosis not present

## 2020-11-18 DIAGNOSIS — N186 End stage renal disease: Secondary | ICD-10-CM | POA: Diagnosis not present

## 2020-11-18 DIAGNOSIS — Z992 Dependence on renal dialysis: Secondary | ICD-10-CM | POA: Diagnosis not present

## 2020-11-20 DIAGNOSIS — Z992 Dependence on renal dialysis: Secondary | ICD-10-CM | POA: Diagnosis not present

## 2020-11-20 DIAGNOSIS — N186 End stage renal disease: Secondary | ICD-10-CM | POA: Diagnosis not present

## 2020-11-20 DIAGNOSIS — N2581 Secondary hyperparathyroidism of renal origin: Secondary | ICD-10-CM | POA: Diagnosis not present

## 2020-11-22 DIAGNOSIS — Z992 Dependence on renal dialysis: Secondary | ICD-10-CM | POA: Diagnosis not present

## 2020-11-22 DIAGNOSIS — N186 End stage renal disease: Secondary | ICD-10-CM | POA: Diagnosis not present

## 2020-11-22 DIAGNOSIS — N2581 Secondary hyperparathyroidism of renal origin: Secondary | ICD-10-CM | POA: Diagnosis not present

## 2020-11-25 DIAGNOSIS — N186 End stage renal disease: Secondary | ICD-10-CM | POA: Diagnosis not present

## 2020-11-25 DIAGNOSIS — N2581 Secondary hyperparathyroidism of renal origin: Secondary | ICD-10-CM | POA: Diagnosis not present

## 2020-11-25 DIAGNOSIS — Z992 Dependence on renal dialysis: Secondary | ICD-10-CM | POA: Diagnosis not present

## 2020-11-27 DIAGNOSIS — N186 End stage renal disease: Secondary | ICD-10-CM | POA: Diagnosis not present

## 2020-11-27 DIAGNOSIS — N2581 Secondary hyperparathyroidism of renal origin: Secondary | ICD-10-CM | POA: Diagnosis not present

## 2020-11-27 DIAGNOSIS — Z992 Dependence on renal dialysis: Secondary | ICD-10-CM | POA: Diagnosis not present

## 2020-11-30 DIAGNOSIS — Z992 Dependence on renal dialysis: Secondary | ICD-10-CM | POA: Diagnosis not present

## 2020-11-30 DIAGNOSIS — N186 End stage renal disease: Secondary | ICD-10-CM | POA: Diagnosis not present

## 2020-11-30 DIAGNOSIS — N2581 Secondary hyperparathyroidism of renal origin: Secondary | ICD-10-CM | POA: Diagnosis not present

## 2020-12-02 DIAGNOSIS — N186 End stage renal disease: Secondary | ICD-10-CM | POA: Diagnosis not present

## 2020-12-02 DIAGNOSIS — N2581 Secondary hyperparathyroidism of renal origin: Secondary | ICD-10-CM | POA: Diagnosis not present

## 2020-12-02 DIAGNOSIS — Z992 Dependence on renal dialysis: Secondary | ICD-10-CM | POA: Diagnosis not present

## 2020-12-04 DIAGNOSIS — Z992 Dependence on renal dialysis: Secondary | ICD-10-CM | POA: Diagnosis not present

## 2020-12-04 DIAGNOSIS — N186 End stage renal disease: Secondary | ICD-10-CM | POA: Diagnosis not present

## 2020-12-04 DIAGNOSIS — N2581 Secondary hyperparathyroidism of renal origin: Secondary | ICD-10-CM | POA: Diagnosis not present

## 2020-12-05 DIAGNOSIS — N186 End stage renal disease: Secondary | ICD-10-CM | POA: Diagnosis not present

## 2020-12-05 DIAGNOSIS — N041 Nephrotic syndrome with focal and segmental glomerular lesions: Secondary | ICD-10-CM | POA: Diagnosis not present

## 2020-12-05 DIAGNOSIS — Z992 Dependence on renal dialysis: Secondary | ICD-10-CM | POA: Diagnosis not present

## 2020-12-07 DIAGNOSIS — N186 End stage renal disease: Secondary | ICD-10-CM | POA: Diagnosis not present

## 2020-12-07 DIAGNOSIS — N2581 Secondary hyperparathyroidism of renal origin: Secondary | ICD-10-CM | POA: Diagnosis not present

## 2020-12-07 DIAGNOSIS — Z992 Dependence on renal dialysis: Secondary | ICD-10-CM | POA: Diagnosis not present

## 2020-12-09 DIAGNOSIS — N2581 Secondary hyperparathyroidism of renal origin: Secondary | ICD-10-CM | POA: Diagnosis not present

## 2020-12-09 DIAGNOSIS — N186 End stage renal disease: Secondary | ICD-10-CM | POA: Diagnosis not present

## 2020-12-09 DIAGNOSIS — Z992 Dependence on renal dialysis: Secondary | ICD-10-CM | POA: Diagnosis not present

## 2020-12-10 DIAGNOSIS — I871 Compression of vein: Secondary | ICD-10-CM | POA: Diagnosis not present

## 2020-12-10 DIAGNOSIS — N186 End stage renal disease: Secondary | ICD-10-CM | POA: Diagnosis not present

## 2020-12-10 DIAGNOSIS — Z992 Dependence on renal dialysis: Secondary | ICD-10-CM | POA: Diagnosis not present

## 2020-12-10 DIAGNOSIS — T82858A Stenosis of vascular prosthetic devices, implants and grafts, initial encounter: Secondary | ICD-10-CM | POA: Diagnosis not present

## 2020-12-11 DIAGNOSIS — N2581 Secondary hyperparathyroidism of renal origin: Secondary | ICD-10-CM | POA: Diagnosis not present

## 2020-12-11 DIAGNOSIS — N186 End stage renal disease: Secondary | ICD-10-CM | POA: Diagnosis not present

## 2020-12-11 DIAGNOSIS — Z992 Dependence on renal dialysis: Secondary | ICD-10-CM | POA: Diagnosis not present

## 2020-12-13 DIAGNOSIS — N2581 Secondary hyperparathyroidism of renal origin: Secondary | ICD-10-CM | POA: Diagnosis not present

## 2020-12-13 DIAGNOSIS — N186 End stage renal disease: Secondary | ICD-10-CM | POA: Diagnosis not present

## 2020-12-13 DIAGNOSIS — Z992 Dependence on renal dialysis: Secondary | ICD-10-CM | POA: Diagnosis not present

## 2020-12-16 DIAGNOSIS — Z992 Dependence on renal dialysis: Secondary | ICD-10-CM | POA: Diagnosis not present

## 2020-12-16 DIAGNOSIS — N186 End stage renal disease: Secondary | ICD-10-CM | POA: Diagnosis not present

## 2020-12-16 DIAGNOSIS — N2581 Secondary hyperparathyroidism of renal origin: Secondary | ICD-10-CM | POA: Diagnosis not present

## 2020-12-18 DIAGNOSIS — N186 End stage renal disease: Secondary | ICD-10-CM | POA: Diagnosis not present

## 2020-12-18 DIAGNOSIS — Z992 Dependence on renal dialysis: Secondary | ICD-10-CM | POA: Diagnosis not present

## 2020-12-18 DIAGNOSIS — N2581 Secondary hyperparathyroidism of renal origin: Secondary | ICD-10-CM | POA: Diagnosis not present

## 2020-12-20 DIAGNOSIS — N2581 Secondary hyperparathyroidism of renal origin: Secondary | ICD-10-CM | POA: Diagnosis not present

## 2020-12-20 DIAGNOSIS — N186 End stage renal disease: Secondary | ICD-10-CM | POA: Diagnosis not present

## 2020-12-20 DIAGNOSIS — Z992 Dependence on renal dialysis: Secondary | ICD-10-CM | POA: Diagnosis not present

## 2020-12-23 DIAGNOSIS — N2581 Secondary hyperparathyroidism of renal origin: Secondary | ICD-10-CM | POA: Diagnosis not present

## 2020-12-23 DIAGNOSIS — N186 End stage renal disease: Secondary | ICD-10-CM | POA: Diagnosis not present

## 2020-12-23 DIAGNOSIS — Z992 Dependence on renal dialysis: Secondary | ICD-10-CM | POA: Diagnosis not present

## 2020-12-25 DIAGNOSIS — Z992 Dependence on renal dialysis: Secondary | ICD-10-CM | POA: Diagnosis not present

## 2020-12-25 DIAGNOSIS — N186 End stage renal disease: Secondary | ICD-10-CM | POA: Diagnosis not present

## 2020-12-25 DIAGNOSIS — N2581 Secondary hyperparathyroidism of renal origin: Secondary | ICD-10-CM | POA: Diagnosis not present

## 2020-12-30 DIAGNOSIS — N186 End stage renal disease: Secondary | ICD-10-CM | POA: Diagnosis not present

## 2020-12-30 DIAGNOSIS — N2581 Secondary hyperparathyroidism of renal origin: Secondary | ICD-10-CM | POA: Diagnosis not present

## 2020-12-30 DIAGNOSIS — Z992 Dependence on renal dialysis: Secondary | ICD-10-CM | POA: Diagnosis not present

## 2021-01-01 DIAGNOSIS — N186 End stage renal disease: Secondary | ICD-10-CM | POA: Diagnosis not present

## 2021-01-01 DIAGNOSIS — B351 Tinea unguium: Secondary | ICD-10-CM | POA: Diagnosis not present

## 2021-01-01 DIAGNOSIS — Z992 Dependence on renal dialysis: Secondary | ICD-10-CM | POA: Diagnosis not present

## 2021-01-01 DIAGNOSIS — N2581 Secondary hyperparathyroidism of renal origin: Secondary | ICD-10-CM | POA: Diagnosis not present

## 2021-01-01 DIAGNOSIS — M79676 Pain in unspecified toe(s): Secondary | ICD-10-CM | POA: Diagnosis not present

## 2021-01-03 DIAGNOSIS — Z992 Dependence on renal dialysis: Secondary | ICD-10-CM | POA: Diagnosis not present

## 2021-01-03 DIAGNOSIS — N2581 Secondary hyperparathyroidism of renal origin: Secondary | ICD-10-CM | POA: Diagnosis not present

## 2021-01-03 DIAGNOSIS — N186 End stage renal disease: Secondary | ICD-10-CM | POA: Diagnosis not present

## 2021-01-05 DIAGNOSIS — N041 Nephrotic syndrome with focal and segmental glomerular lesions: Secondary | ICD-10-CM | POA: Diagnosis not present

## 2021-01-05 DIAGNOSIS — Z992 Dependence on renal dialysis: Secondary | ICD-10-CM | POA: Diagnosis not present

## 2021-01-05 DIAGNOSIS — N186 End stage renal disease: Secondary | ICD-10-CM | POA: Diagnosis not present

## 2021-01-06 DIAGNOSIS — N2581 Secondary hyperparathyroidism of renal origin: Secondary | ICD-10-CM | POA: Diagnosis not present

## 2021-01-06 DIAGNOSIS — Z992 Dependence on renal dialysis: Secondary | ICD-10-CM | POA: Diagnosis not present

## 2021-01-06 DIAGNOSIS — N186 End stage renal disease: Secondary | ICD-10-CM | POA: Diagnosis not present

## 2021-01-08 DIAGNOSIS — N186 End stage renal disease: Secondary | ICD-10-CM | POA: Diagnosis not present

## 2021-01-08 DIAGNOSIS — Z992 Dependence on renal dialysis: Secondary | ICD-10-CM | POA: Diagnosis not present

## 2021-01-08 DIAGNOSIS — N2581 Secondary hyperparathyroidism of renal origin: Secondary | ICD-10-CM | POA: Diagnosis not present

## 2021-01-10 DIAGNOSIS — N2581 Secondary hyperparathyroidism of renal origin: Secondary | ICD-10-CM | POA: Diagnosis not present

## 2021-01-10 DIAGNOSIS — Z992 Dependence on renal dialysis: Secondary | ICD-10-CM | POA: Diagnosis not present

## 2021-01-10 DIAGNOSIS — N186 End stage renal disease: Secondary | ICD-10-CM | POA: Diagnosis not present

## 2021-01-13 DIAGNOSIS — N2581 Secondary hyperparathyroidism of renal origin: Secondary | ICD-10-CM | POA: Diagnosis not present

## 2021-01-13 DIAGNOSIS — Z992 Dependence on renal dialysis: Secondary | ICD-10-CM | POA: Diagnosis not present

## 2021-01-13 DIAGNOSIS — N186 End stage renal disease: Secondary | ICD-10-CM | POA: Diagnosis not present

## 2021-01-15 DIAGNOSIS — N186 End stage renal disease: Secondary | ICD-10-CM | POA: Diagnosis not present

## 2021-01-15 DIAGNOSIS — N2581 Secondary hyperparathyroidism of renal origin: Secondary | ICD-10-CM | POA: Diagnosis not present

## 2021-01-15 DIAGNOSIS — Z992 Dependence on renal dialysis: Secondary | ICD-10-CM | POA: Diagnosis not present

## 2021-01-17 DIAGNOSIS — Z992 Dependence on renal dialysis: Secondary | ICD-10-CM | POA: Diagnosis not present

## 2021-01-17 DIAGNOSIS — N186 End stage renal disease: Secondary | ICD-10-CM | POA: Diagnosis not present

## 2021-01-17 DIAGNOSIS — N2581 Secondary hyperparathyroidism of renal origin: Secondary | ICD-10-CM | POA: Diagnosis not present

## 2021-01-20 DIAGNOSIS — Z992 Dependence on renal dialysis: Secondary | ICD-10-CM | POA: Diagnosis not present

## 2021-01-20 DIAGNOSIS — N2581 Secondary hyperparathyroidism of renal origin: Secondary | ICD-10-CM | POA: Diagnosis not present

## 2021-01-20 DIAGNOSIS — N186 End stage renal disease: Secondary | ICD-10-CM | POA: Diagnosis not present

## 2021-01-22 DIAGNOSIS — N186 End stage renal disease: Secondary | ICD-10-CM | POA: Diagnosis not present

## 2021-01-22 DIAGNOSIS — Z992 Dependence on renal dialysis: Secondary | ICD-10-CM | POA: Diagnosis not present

## 2021-01-22 DIAGNOSIS — N2581 Secondary hyperparathyroidism of renal origin: Secondary | ICD-10-CM | POA: Diagnosis not present

## 2021-01-24 DIAGNOSIS — N186 End stage renal disease: Secondary | ICD-10-CM | POA: Diagnosis not present

## 2021-01-24 DIAGNOSIS — N2581 Secondary hyperparathyroidism of renal origin: Secondary | ICD-10-CM | POA: Diagnosis not present

## 2021-01-24 DIAGNOSIS — Z992 Dependence on renal dialysis: Secondary | ICD-10-CM | POA: Diagnosis not present

## 2021-01-27 DIAGNOSIS — N186 End stage renal disease: Secondary | ICD-10-CM | POA: Diagnosis not present

## 2021-01-27 DIAGNOSIS — N2581 Secondary hyperparathyroidism of renal origin: Secondary | ICD-10-CM | POA: Diagnosis not present

## 2021-01-27 DIAGNOSIS — Z992 Dependence on renal dialysis: Secondary | ICD-10-CM | POA: Diagnosis not present

## 2021-01-29 DIAGNOSIS — Z992 Dependence on renal dialysis: Secondary | ICD-10-CM | POA: Diagnosis not present

## 2021-01-29 DIAGNOSIS — N2581 Secondary hyperparathyroidism of renal origin: Secondary | ICD-10-CM | POA: Diagnosis not present

## 2021-01-29 DIAGNOSIS — N186 End stage renal disease: Secondary | ICD-10-CM | POA: Diagnosis not present

## 2021-01-31 DIAGNOSIS — Z992 Dependence on renal dialysis: Secondary | ICD-10-CM | POA: Diagnosis not present

## 2021-01-31 DIAGNOSIS — N186 End stage renal disease: Secondary | ICD-10-CM | POA: Diagnosis not present

## 2021-01-31 DIAGNOSIS — N2581 Secondary hyperparathyroidism of renal origin: Secondary | ICD-10-CM | POA: Diagnosis not present

## 2021-02-02 DIAGNOSIS — Z992 Dependence on renal dialysis: Secondary | ICD-10-CM | POA: Diagnosis not present

## 2021-02-02 DIAGNOSIS — N186 End stage renal disease: Secondary | ICD-10-CM | POA: Diagnosis not present

## 2021-02-02 DIAGNOSIS — N041 Nephrotic syndrome with focal and segmental glomerular lesions: Secondary | ICD-10-CM | POA: Diagnosis not present

## 2021-02-03 DIAGNOSIS — N2581 Secondary hyperparathyroidism of renal origin: Secondary | ICD-10-CM | POA: Diagnosis not present

## 2021-02-03 DIAGNOSIS — N186 End stage renal disease: Secondary | ICD-10-CM | POA: Diagnosis not present

## 2021-02-03 DIAGNOSIS — Z992 Dependence on renal dialysis: Secondary | ICD-10-CM | POA: Diagnosis not present

## 2021-02-05 DIAGNOSIS — N186 End stage renal disease: Secondary | ICD-10-CM | POA: Diagnosis not present

## 2021-02-05 DIAGNOSIS — N2581 Secondary hyperparathyroidism of renal origin: Secondary | ICD-10-CM | POA: Diagnosis not present

## 2021-02-05 DIAGNOSIS — Z992 Dependence on renal dialysis: Secondary | ICD-10-CM | POA: Diagnosis not present

## 2021-02-07 DIAGNOSIS — N186 End stage renal disease: Secondary | ICD-10-CM | POA: Diagnosis not present

## 2021-02-07 DIAGNOSIS — N2581 Secondary hyperparathyroidism of renal origin: Secondary | ICD-10-CM | POA: Diagnosis not present

## 2021-02-07 DIAGNOSIS — Z992 Dependence on renal dialysis: Secondary | ICD-10-CM | POA: Diagnosis not present

## 2021-02-10 DIAGNOSIS — N186 End stage renal disease: Secondary | ICD-10-CM | POA: Diagnosis not present

## 2021-02-10 DIAGNOSIS — Z992 Dependence on renal dialysis: Secondary | ICD-10-CM | POA: Diagnosis not present

## 2021-02-10 DIAGNOSIS — N2581 Secondary hyperparathyroidism of renal origin: Secondary | ICD-10-CM | POA: Diagnosis not present

## 2021-02-12 DIAGNOSIS — N186 End stage renal disease: Secondary | ICD-10-CM | POA: Diagnosis not present

## 2021-02-12 DIAGNOSIS — N2581 Secondary hyperparathyroidism of renal origin: Secondary | ICD-10-CM | POA: Diagnosis not present

## 2021-02-12 DIAGNOSIS — Z992 Dependence on renal dialysis: Secondary | ICD-10-CM | POA: Diagnosis not present

## 2021-02-14 DIAGNOSIS — N2581 Secondary hyperparathyroidism of renal origin: Secondary | ICD-10-CM | POA: Diagnosis not present

## 2021-02-14 DIAGNOSIS — N186 End stage renal disease: Secondary | ICD-10-CM | POA: Diagnosis not present

## 2021-02-14 DIAGNOSIS — Z992 Dependence on renal dialysis: Secondary | ICD-10-CM | POA: Diagnosis not present

## 2021-02-17 DIAGNOSIS — Z992 Dependence on renal dialysis: Secondary | ICD-10-CM | POA: Diagnosis not present

## 2021-02-17 DIAGNOSIS — N186 End stage renal disease: Secondary | ICD-10-CM | POA: Diagnosis not present

## 2021-02-17 DIAGNOSIS — N2581 Secondary hyperparathyroidism of renal origin: Secondary | ICD-10-CM | POA: Diagnosis not present

## 2021-02-19 DIAGNOSIS — N186 End stage renal disease: Secondary | ICD-10-CM | POA: Diagnosis not present

## 2021-02-19 DIAGNOSIS — N2581 Secondary hyperparathyroidism of renal origin: Secondary | ICD-10-CM | POA: Diagnosis not present

## 2021-02-19 DIAGNOSIS — Z992 Dependence on renal dialysis: Secondary | ICD-10-CM | POA: Diagnosis not present

## 2021-02-21 DIAGNOSIS — N2581 Secondary hyperparathyroidism of renal origin: Secondary | ICD-10-CM | POA: Diagnosis not present

## 2021-02-21 DIAGNOSIS — N186 End stage renal disease: Secondary | ICD-10-CM | POA: Diagnosis not present

## 2021-02-21 DIAGNOSIS — Z992 Dependence on renal dialysis: Secondary | ICD-10-CM | POA: Diagnosis not present

## 2021-02-24 DIAGNOSIS — Z992 Dependence on renal dialysis: Secondary | ICD-10-CM | POA: Diagnosis not present

## 2021-02-24 DIAGNOSIS — N2581 Secondary hyperparathyroidism of renal origin: Secondary | ICD-10-CM | POA: Diagnosis not present

## 2021-02-24 DIAGNOSIS — N186 End stage renal disease: Secondary | ICD-10-CM | POA: Diagnosis not present

## 2021-02-26 DIAGNOSIS — N186 End stage renal disease: Secondary | ICD-10-CM | POA: Diagnosis not present

## 2021-02-26 DIAGNOSIS — N2581 Secondary hyperparathyroidism of renal origin: Secondary | ICD-10-CM | POA: Diagnosis not present

## 2021-02-26 DIAGNOSIS — Z992 Dependence on renal dialysis: Secondary | ICD-10-CM | POA: Diagnosis not present

## 2021-02-28 DIAGNOSIS — Z992 Dependence on renal dialysis: Secondary | ICD-10-CM | POA: Diagnosis not present

## 2021-02-28 DIAGNOSIS — N186 End stage renal disease: Secondary | ICD-10-CM | POA: Diagnosis not present

## 2021-02-28 DIAGNOSIS — N2581 Secondary hyperparathyroidism of renal origin: Secondary | ICD-10-CM | POA: Diagnosis not present

## 2021-03-03 DIAGNOSIS — N2581 Secondary hyperparathyroidism of renal origin: Secondary | ICD-10-CM | POA: Diagnosis not present

## 2021-03-03 DIAGNOSIS — Z992 Dependence on renal dialysis: Secondary | ICD-10-CM | POA: Diagnosis not present

## 2021-03-03 DIAGNOSIS — N186 End stage renal disease: Secondary | ICD-10-CM | POA: Diagnosis not present

## 2021-03-05 DIAGNOSIS — N2581 Secondary hyperparathyroidism of renal origin: Secondary | ICD-10-CM | POA: Diagnosis not present

## 2021-03-05 DIAGNOSIS — Z992 Dependence on renal dialysis: Secondary | ICD-10-CM | POA: Diagnosis not present

## 2021-03-05 DIAGNOSIS — N041 Nephrotic syndrome with focal and segmental glomerular lesions: Secondary | ICD-10-CM | POA: Diagnosis not present

## 2021-03-05 DIAGNOSIS — N186 End stage renal disease: Secondary | ICD-10-CM | POA: Diagnosis not present

## 2021-03-07 DIAGNOSIS — N186 End stage renal disease: Secondary | ICD-10-CM | POA: Diagnosis not present

## 2021-03-07 DIAGNOSIS — N2581 Secondary hyperparathyroidism of renal origin: Secondary | ICD-10-CM | POA: Diagnosis not present

## 2021-03-07 DIAGNOSIS — Z992 Dependence on renal dialysis: Secondary | ICD-10-CM | POA: Diagnosis not present

## 2021-03-10 DIAGNOSIS — Z992 Dependence on renal dialysis: Secondary | ICD-10-CM | POA: Diagnosis not present

## 2021-03-10 DIAGNOSIS — N186 End stage renal disease: Secondary | ICD-10-CM | POA: Diagnosis not present

## 2021-03-10 DIAGNOSIS — N2581 Secondary hyperparathyroidism of renal origin: Secondary | ICD-10-CM | POA: Diagnosis not present

## 2021-03-12 DIAGNOSIS — N186 End stage renal disease: Secondary | ICD-10-CM | POA: Diagnosis not present

## 2021-03-12 DIAGNOSIS — N2581 Secondary hyperparathyroidism of renal origin: Secondary | ICD-10-CM | POA: Diagnosis not present

## 2021-03-12 DIAGNOSIS — Z992 Dependence on renal dialysis: Secondary | ICD-10-CM | POA: Diagnosis not present

## 2021-03-14 DIAGNOSIS — Z992 Dependence on renal dialysis: Secondary | ICD-10-CM | POA: Diagnosis not present

## 2021-03-14 DIAGNOSIS — N186 End stage renal disease: Secondary | ICD-10-CM | POA: Diagnosis not present

## 2021-03-14 DIAGNOSIS — N2581 Secondary hyperparathyroidism of renal origin: Secondary | ICD-10-CM | POA: Diagnosis not present

## 2021-03-17 DIAGNOSIS — N186 End stage renal disease: Secondary | ICD-10-CM | POA: Diagnosis not present

## 2021-03-17 DIAGNOSIS — Z992 Dependence on renal dialysis: Secondary | ICD-10-CM | POA: Diagnosis not present

## 2021-03-17 DIAGNOSIS — N2581 Secondary hyperparathyroidism of renal origin: Secondary | ICD-10-CM | POA: Diagnosis not present

## 2021-03-19 DIAGNOSIS — Z992 Dependence on renal dialysis: Secondary | ICD-10-CM | POA: Diagnosis not present

## 2021-03-19 DIAGNOSIS — N2581 Secondary hyperparathyroidism of renal origin: Secondary | ICD-10-CM | POA: Diagnosis not present

## 2021-03-19 DIAGNOSIS — N186 End stage renal disease: Secondary | ICD-10-CM | POA: Diagnosis not present

## 2021-03-21 DIAGNOSIS — Z992 Dependence on renal dialysis: Secondary | ICD-10-CM | POA: Diagnosis not present

## 2021-03-21 DIAGNOSIS — N2581 Secondary hyperparathyroidism of renal origin: Secondary | ICD-10-CM | POA: Diagnosis not present

## 2021-03-21 DIAGNOSIS — N186 End stage renal disease: Secondary | ICD-10-CM | POA: Diagnosis not present

## 2021-03-24 DIAGNOSIS — N186 End stage renal disease: Secondary | ICD-10-CM | POA: Diagnosis not present

## 2021-03-24 DIAGNOSIS — Z992 Dependence on renal dialysis: Secondary | ICD-10-CM | POA: Diagnosis not present

## 2021-03-24 DIAGNOSIS — N2581 Secondary hyperparathyroidism of renal origin: Secondary | ICD-10-CM | POA: Diagnosis not present

## 2021-03-26 DIAGNOSIS — N186 End stage renal disease: Secondary | ICD-10-CM | POA: Diagnosis not present

## 2021-03-26 DIAGNOSIS — N2581 Secondary hyperparathyroidism of renal origin: Secondary | ICD-10-CM | POA: Diagnosis not present

## 2021-03-26 DIAGNOSIS — Z992 Dependence on renal dialysis: Secondary | ICD-10-CM | POA: Diagnosis not present

## 2021-03-28 DIAGNOSIS — Z992 Dependence on renal dialysis: Secondary | ICD-10-CM | POA: Diagnosis not present

## 2021-03-28 DIAGNOSIS — N186 End stage renal disease: Secondary | ICD-10-CM | POA: Diagnosis not present

## 2021-03-28 DIAGNOSIS — N2581 Secondary hyperparathyroidism of renal origin: Secondary | ICD-10-CM | POA: Diagnosis not present

## 2021-03-31 DIAGNOSIS — N2581 Secondary hyperparathyroidism of renal origin: Secondary | ICD-10-CM | POA: Diagnosis not present

## 2021-03-31 DIAGNOSIS — N186 End stage renal disease: Secondary | ICD-10-CM | POA: Diagnosis not present

## 2021-03-31 DIAGNOSIS — Z992 Dependence on renal dialysis: Secondary | ICD-10-CM | POA: Diagnosis not present

## 2021-04-02 DIAGNOSIS — N186 End stage renal disease: Secondary | ICD-10-CM | POA: Diagnosis not present

## 2021-04-02 DIAGNOSIS — N2581 Secondary hyperparathyroidism of renal origin: Secondary | ICD-10-CM | POA: Diagnosis not present

## 2021-04-02 DIAGNOSIS — Z992 Dependence on renal dialysis: Secondary | ICD-10-CM | POA: Diagnosis not present

## 2021-04-04 DIAGNOSIS — Z992 Dependence on renal dialysis: Secondary | ICD-10-CM | POA: Diagnosis not present

## 2021-04-04 DIAGNOSIS — N2581 Secondary hyperparathyroidism of renal origin: Secondary | ICD-10-CM | POA: Diagnosis not present

## 2021-04-04 DIAGNOSIS — N041 Nephrotic syndrome with focal and segmental glomerular lesions: Secondary | ICD-10-CM | POA: Diagnosis not present

## 2021-04-04 DIAGNOSIS — N186 End stage renal disease: Secondary | ICD-10-CM | POA: Diagnosis not present

## 2021-04-07 DIAGNOSIS — Z992 Dependence on renal dialysis: Secondary | ICD-10-CM | POA: Diagnosis not present

## 2021-04-07 DIAGNOSIS — N186 End stage renal disease: Secondary | ICD-10-CM | POA: Diagnosis not present

## 2021-04-07 DIAGNOSIS — N2581 Secondary hyperparathyroidism of renal origin: Secondary | ICD-10-CM | POA: Diagnosis not present

## 2021-04-09 DIAGNOSIS — N2581 Secondary hyperparathyroidism of renal origin: Secondary | ICD-10-CM | POA: Diagnosis not present

## 2021-04-09 DIAGNOSIS — Z992 Dependence on renal dialysis: Secondary | ICD-10-CM | POA: Diagnosis not present

## 2021-04-09 DIAGNOSIS — N186 End stage renal disease: Secondary | ICD-10-CM | POA: Diagnosis not present

## 2021-04-11 DIAGNOSIS — N186 End stage renal disease: Secondary | ICD-10-CM | POA: Diagnosis not present

## 2021-04-11 DIAGNOSIS — Z992 Dependence on renal dialysis: Secondary | ICD-10-CM | POA: Diagnosis not present

## 2021-04-11 DIAGNOSIS — N2581 Secondary hyperparathyroidism of renal origin: Secondary | ICD-10-CM | POA: Diagnosis not present

## 2021-04-14 DIAGNOSIS — Z992 Dependence on renal dialysis: Secondary | ICD-10-CM | POA: Diagnosis not present

## 2021-04-14 DIAGNOSIS — N186 End stage renal disease: Secondary | ICD-10-CM | POA: Diagnosis not present

## 2021-04-14 DIAGNOSIS — N2581 Secondary hyperparathyroidism of renal origin: Secondary | ICD-10-CM | POA: Diagnosis not present

## 2021-04-16 DIAGNOSIS — N2581 Secondary hyperparathyroidism of renal origin: Secondary | ICD-10-CM | POA: Diagnosis not present

## 2021-04-16 DIAGNOSIS — L97421 Non-pressure chronic ulcer of left heel and midfoot limited to breakdown of skin: Secondary | ICD-10-CM | POA: Diagnosis not present

## 2021-04-16 DIAGNOSIS — N186 End stage renal disease: Secondary | ICD-10-CM | POA: Diagnosis not present

## 2021-04-16 DIAGNOSIS — Z992 Dependence on renal dialysis: Secondary | ICD-10-CM | POA: Diagnosis not present

## 2021-04-18 DIAGNOSIS — Z992 Dependence on renal dialysis: Secondary | ICD-10-CM | POA: Diagnosis not present

## 2021-04-18 DIAGNOSIS — N2581 Secondary hyperparathyroidism of renal origin: Secondary | ICD-10-CM | POA: Diagnosis not present

## 2021-04-18 DIAGNOSIS — N186 End stage renal disease: Secondary | ICD-10-CM | POA: Diagnosis not present

## 2021-04-21 DIAGNOSIS — N186 End stage renal disease: Secondary | ICD-10-CM | POA: Diagnosis not present

## 2021-04-21 DIAGNOSIS — N2581 Secondary hyperparathyroidism of renal origin: Secondary | ICD-10-CM | POA: Diagnosis not present

## 2021-04-21 DIAGNOSIS — Z992 Dependence on renal dialysis: Secondary | ICD-10-CM | POA: Diagnosis not present

## 2021-04-23 DIAGNOSIS — N2581 Secondary hyperparathyroidism of renal origin: Secondary | ICD-10-CM | POA: Diagnosis not present

## 2021-04-23 DIAGNOSIS — Z992 Dependence on renal dialysis: Secondary | ICD-10-CM | POA: Diagnosis not present

## 2021-04-23 DIAGNOSIS — N186 End stage renal disease: Secondary | ICD-10-CM | POA: Diagnosis not present

## 2021-04-25 DIAGNOSIS — N2581 Secondary hyperparathyroidism of renal origin: Secondary | ICD-10-CM | POA: Diagnosis not present

## 2021-04-25 DIAGNOSIS — Z992 Dependence on renal dialysis: Secondary | ICD-10-CM | POA: Diagnosis not present

## 2021-04-25 DIAGNOSIS — N186 End stage renal disease: Secondary | ICD-10-CM | POA: Diagnosis not present

## 2021-04-28 DIAGNOSIS — N186 End stage renal disease: Secondary | ICD-10-CM | POA: Diagnosis not present

## 2021-04-28 DIAGNOSIS — N2581 Secondary hyperparathyroidism of renal origin: Secondary | ICD-10-CM | POA: Diagnosis not present

## 2021-04-28 DIAGNOSIS — Z992 Dependence on renal dialysis: Secondary | ICD-10-CM | POA: Diagnosis not present

## 2021-04-30 DIAGNOSIS — N2581 Secondary hyperparathyroidism of renal origin: Secondary | ICD-10-CM | POA: Diagnosis not present

## 2021-04-30 DIAGNOSIS — N186 End stage renal disease: Secondary | ICD-10-CM | POA: Diagnosis not present

## 2021-04-30 DIAGNOSIS — Z992 Dependence on renal dialysis: Secondary | ICD-10-CM | POA: Diagnosis not present

## 2021-05-02 DIAGNOSIS — N186 End stage renal disease: Secondary | ICD-10-CM | POA: Diagnosis not present

## 2021-05-02 DIAGNOSIS — Z992 Dependence on renal dialysis: Secondary | ICD-10-CM | POA: Diagnosis not present

## 2021-05-02 DIAGNOSIS — N2581 Secondary hyperparathyroidism of renal origin: Secondary | ICD-10-CM | POA: Diagnosis not present

## 2021-05-05 DIAGNOSIS — N186 End stage renal disease: Secondary | ICD-10-CM | POA: Diagnosis not present

## 2021-05-05 DIAGNOSIS — N041 Nephrotic syndrome with focal and segmental glomerular lesions: Secondary | ICD-10-CM | POA: Diagnosis not present

## 2021-05-05 DIAGNOSIS — Z992 Dependence on renal dialysis: Secondary | ICD-10-CM | POA: Diagnosis not present

## 2021-05-05 DIAGNOSIS — N2581 Secondary hyperparathyroidism of renal origin: Secondary | ICD-10-CM | POA: Diagnosis not present

## 2021-05-07 DIAGNOSIS — N2581 Secondary hyperparathyroidism of renal origin: Secondary | ICD-10-CM | POA: Diagnosis not present

## 2021-05-07 DIAGNOSIS — N186 End stage renal disease: Secondary | ICD-10-CM | POA: Diagnosis not present

## 2021-05-07 DIAGNOSIS — Z992 Dependence on renal dialysis: Secondary | ICD-10-CM | POA: Diagnosis not present

## 2021-05-09 DIAGNOSIS — N2581 Secondary hyperparathyroidism of renal origin: Secondary | ICD-10-CM | POA: Diagnosis not present

## 2021-05-09 DIAGNOSIS — Z992 Dependence on renal dialysis: Secondary | ICD-10-CM | POA: Diagnosis not present

## 2021-05-09 DIAGNOSIS — N186 End stage renal disease: Secondary | ICD-10-CM | POA: Diagnosis not present

## 2021-05-12 DIAGNOSIS — Z992 Dependence on renal dialysis: Secondary | ICD-10-CM | POA: Diagnosis not present

## 2021-05-12 DIAGNOSIS — N186 End stage renal disease: Secondary | ICD-10-CM | POA: Diagnosis not present

## 2021-05-12 DIAGNOSIS — N2581 Secondary hyperparathyroidism of renal origin: Secondary | ICD-10-CM | POA: Diagnosis not present

## 2021-05-14 DIAGNOSIS — N186 End stage renal disease: Secondary | ICD-10-CM | POA: Diagnosis not present

## 2021-05-14 DIAGNOSIS — N2581 Secondary hyperparathyroidism of renal origin: Secondary | ICD-10-CM | POA: Diagnosis not present

## 2021-05-14 DIAGNOSIS — Z992 Dependence on renal dialysis: Secondary | ICD-10-CM | POA: Diagnosis not present

## 2021-05-16 DIAGNOSIS — Z992 Dependence on renal dialysis: Secondary | ICD-10-CM | POA: Diagnosis not present

## 2021-05-16 DIAGNOSIS — N2581 Secondary hyperparathyroidism of renal origin: Secondary | ICD-10-CM | POA: Diagnosis not present

## 2021-05-16 DIAGNOSIS — N186 End stage renal disease: Secondary | ICD-10-CM | POA: Diagnosis not present

## 2021-05-19 DIAGNOSIS — N186 End stage renal disease: Secondary | ICD-10-CM | POA: Diagnosis not present

## 2021-05-19 DIAGNOSIS — N2581 Secondary hyperparathyroidism of renal origin: Secondary | ICD-10-CM | POA: Diagnosis not present

## 2021-05-19 DIAGNOSIS — Z992 Dependence on renal dialysis: Secondary | ICD-10-CM | POA: Diagnosis not present

## 2021-05-21 DIAGNOSIS — Z992 Dependence on renal dialysis: Secondary | ICD-10-CM | POA: Diagnosis not present

## 2021-05-21 DIAGNOSIS — N2581 Secondary hyperparathyroidism of renal origin: Secondary | ICD-10-CM | POA: Diagnosis not present

## 2021-05-21 DIAGNOSIS — N186 End stage renal disease: Secondary | ICD-10-CM | POA: Diagnosis not present

## 2021-05-23 DIAGNOSIS — N186 End stage renal disease: Secondary | ICD-10-CM | POA: Diagnosis not present

## 2021-05-23 DIAGNOSIS — N2581 Secondary hyperparathyroidism of renal origin: Secondary | ICD-10-CM | POA: Diagnosis not present

## 2021-05-23 DIAGNOSIS — Z992 Dependence on renal dialysis: Secondary | ICD-10-CM | POA: Diagnosis not present

## 2021-05-26 DIAGNOSIS — Z992 Dependence on renal dialysis: Secondary | ICD-10-CM | POA: Diagnosis not present

## 2021-05-26 DIAGNOSIS — N2581 Secondary hyperparathyroidism of renal origin: Secondary | ICD-10-CM | POA: Diagnosis not present

## 2021-05-26 DIAGNOSIS — N186 End stage renal disease: Secondary | ICD-10-CM | POA: Diagnosis not present

## 2021-05-28 DIAGNOSIS — Z992 Dependence on renal dialysis: Secondary | ICD-10-CM | POA: Diagnosis not present

## 2021-05-28 DIAGNOSIS — N186 End stage renal disease: Secondary | ICD-10-CM | POA: Diagnosis not present

## 2021-05-28 DIAGNOSIS — N2581 Secondary hyperparathyroidism of renal origin: Secondary | ICD-10-CM | POA: Diagnosis not present

## 2021-05-30 DIAGNOSIS — N186 End stage renal disease: Secondary | ICD-10-CM | POA: Diagnosis not present

## 2021-05-30 DIAGNOSIS — Z992 Dependence on renal dialysis: Secondary | ICD-10-CM | POA: Diagnosis not present

## 2021-05-30 DIAGNOSIS — N2581 Secondary hyperparathyroidism of renal origin: Secondary | ICD-10-CM | POA: Diagnosis not present

## 2021-06-02 DIAGNOSIS — N186 End stage renal disease: Secondary | ICD-10-CM | POA: Diagnosis not present

## 2021-06-02 DIAGNOSIS — N2581 Secondary hyperparathyroidism of renal origin: Secondary | ICD-10-CM | POA: Diagnosis not present

## 2021-06-02 DIAGNOSIS — Z992 Dependence on renal dialysis: Secondary | ICD-10-CM | POA: Diagnosis not present

## 2021-06-04 DIAGNOSIS — Z992 Dependence on renal dialysis: Secondary | ICD-10-CM | POA: Diagnosis not present

## 2021-06-04 DIAGNOSIS — N041 Nephrotic syndrome with focal and segmental glomerular lesions: Secondary | ICD-10-CM | POA: Diagnosis not present

## 2021-06-04 DIAGNOSIS — N186 End stage renal disease: Secondary | ICD-10-CM | POA: Diagnosis not present

## 2021-06-04 DIAGNOSIS — N2581 Secondary hyperparathyroidism of renal origin: Secondary | ICD-10-CM | POA: Diagnosis not present

## 2021-06-06 DIAGNOSIS — N186 End stage renal disease: Secondary | ICD-10-CM | POA: Diagnosis not present

## 2021-06-06 DIAGNOSIS — Z992 Dependence on renal dialysis: Secondary | ICD-10-CM | POA: Diagnosis not present

## 2021-06-06 DIAGNOSIS — N2581 Secondary hyperparathyroidism of renal origin: Secondary | ICD-10-CM | POA: Diagnosis not present

## 2021-06-09 DIAGNOSIS — Z992 Dependence on renal dialysis: Secondary | ICD-10-CM | POA: Diagnosis not present

## 2021-06-09 DIAGNOSIS — N186 End stage renal disease: Secondary | ICD-10-CM | POA: Diagnosis not present

## 2021-06-09 DIAGNOSIS — N2581 Secondary hyperparathyroidism of renal origin: Secondary | ICD-10-CM | POA: Diagnosis not present

## 2021-06-11 DIAGNOSIS — Z992 Dependence on renal dialysis: Secondary | ICD-10-CM | POA: Diagnosis not present

## 2021-06-11 DIAGNOSIS — N2581 Secondary hyperparathyroidism of renal origin: Secondary | ICD-10-CM | POA: Diagnosis not present

## 2021-06-11 DIAGNOSIS — N186 End stage renal disease: Secondary | ICD-10-CM | POA: Diagnosis not present

## 2021-06-13 DIAGNOSIS — Z992 Dependence on renal dialysis: Secondary | ICD-10-CM | POA: Diagnosis not present

## 2021-06-13 DIAGNOSIS — N186 End stage renal disease: Secondary | ICD-10-CM | POA: Diagnosis not present

## 2021-06-13 DIAGNOSIS — N2581 Secondary hyperparathyroidism of renal origin: Secondary | ICD-10-CM | POA: Diagnosis not present

## 2021-06-16 DIAGNOSIS — N2581 Secondary hyperparathyroidism of renal origin: Secondary | ICD-10-CM | POA: Diagnosis not present

## 2021-06-16 DIAGNOSIS — Z992 Dependence on renal dialysis: Secondary | ICD-10-CM | POA: Diagnosis not present

## 2021-06-16 DIAGNOSIS — N186 End stage renal disease: Secondary | ICD-10-CM | POA: Diagnosis not present

## 2021-06-18 DIAGNOSIS — N2581 Secondary hyperparathyroidism of renal origin: Secondary | ICD-10-CM | POA: Diagnosis not present

## 2021-06-18 DIAGNOSIS — Z992 Dependence on renal dialysis: Secondary | ICD-10-CM | POA: Diagnosis not present

## 2021-06-18 DIAGNOSIS — N186 End stage renal disease: Secondary | ICD-10-CM | POA: Diagnosis not present

## 2021-06-20 DIAGNOSIS — N2581 Secondary hyperparathyroidism of renal origin: Secondary | ICD-10-CM | POA: Diagnosis not present

## 2021-06-20 DIAGNOSIS — N186 End stage renal disease: Secondary | ICD-10-CM | POA: Diagnosis not present

## 2021-06-20 DIAGNOSIS — Z992 Dependence on renal dialysis: Secondary | ICD-10-CM | POA: Diagnosis not present

## 2021-06-23 DIAGNOSIS — Z992 Dependence on renal dialysis: Secondary | ICD-10-CM | POA: Diagnosis not present

## 2021-06-23 DIAGNOSIS — N2581 Secondary hyperparathyroidism of renal origin: Secondary | ICD-10-CM | POA: Diagnosis not present

## 2021-06-23 DIAGNOSIS — N186 End stage renal disease: Secondary | ICD-10-CM | POA: Diagnosis not present

## 2021-06-25 DIAGNOSIS — N2581 Secondary hyperparathyroidism of renal origin: Secondary | ICD-10-CM | POA: Diagnosis not present

## 2021-06-25 DIAGNOSIS — N186 End stage renal disease: Secondary | ICD-10-CM | POA: Diagnosis not present

## 2021-06-25 DIAGNOSIS — Z992 Dependence on renal dialysis: Secondary | ICD-10-CM | POA: Diagnosis not present

## 2021-06-30 DIAGNOSIS — N186 End stage renal disease: Secondary | ICD-10-CM | POA: Diagnosis not present

## 2021-06-30 DIAGNOSIS — Z992 Dependence on renal dialysis: Secondary | ICD-10-CM | POA: Diagnosis not present

## 2021-06-30 DIAGNOSIS — N2581 Secondary hyperparathyroidism of renal origin: Secondary | ICD-10-CM | POA: Diagnosis not present

## 2021-07-02 DIAGNOSIS — N2581 Secondary hyperparathyroidism of renal origin: Secondary | ICD-10-CM | POA: Diagnosis not present

## 2021-07-02 DIAGNOSIS — Z992 Dependence on renal dialysis: Secondary | ICD-10-CM | POA: Diagnosis not present

## 2021-07-02 DIAGNOSIS — N186 End stage renal disease: Secondary | ICD-10-CM | POA: Diagnosis not present

## 2021-07-04 DIAGNOSIS — N2581 Secondary hyperparathyroidism of renal origin: Secondary | ICD-10-CM | POA: Diagnosis not present

## 2021-07-04 DIAGNOSIS — Z992 Dependence on renal dialysis: Secondary | ICD-10-CM | POA: Diagnosis not present

## 2021-07-04 DIAGNOSIS — N186 End stage renal disease: Secondary | ICD-10-CM | POA: Diagnosis not present

## 2021-07-05 DIAGNOSIS — N186 End stage renal disease: Secondary | ICD-10-CM | POA: Diagnosis not present

## 2021-07-05 DIAGNOSIS — Z992 Dependence on renal dialysis: Secondary | ICD-10-CM | POA: Diagnosis not present

## 2021-07-05 DIAGNOSIS — N041 Nephrotic syndrome with focal and segmental glomerular lesions: Secondary | ICD-10-CM | POA: Diagnosis not present

## 2021-07-07 DIAGNOSIS — N186 End stage renal disease: Secondary | ICD-10-CM | POA: Diagnosis not present

## 2021-07-07 DIAGNOSIS — N2581 Secondary hyperparathyroidism of renal origin: Secondary | ICD-10-CM | POA: Diagnosis not present

## 2021-07-07 DIAGNOSIS — Z992 Dependence on renal dialysis: Secondary | ICD-10-CM | POA: Diagnosis not present

## 2021-07-09 DIAGNOSIS — N186 End stage renal disease: Secondary | ICD-10-CM | POA: Diagnosis not present

## 2021-07-09 DIAGNOSIS — N2581 Secondary hyperparathyroidism of renal origin: Secondary | ICD-10-CM | POA: Diagnosis not present

## 2021-07-09 DIAGNOSIS — Z992 Dependence on renal dialysis: Secondary | ICD-10-CM | POA: Diagnosis not present

## 2021-07-11 DIAGNOSIS — N186 End stage renal disease: Secondary | ICD-10-CM | POA: Diagnosis not present

## 2021-07-11 DIAGNOSIS — Z992 Dependence on renal dialysis: Secondary | ICD-10-CM | POA: Diagnosis not present

## 2021-07-11 DIAGNOSIS — N2581 Secondary hyperparathyroidism of renal origin: Secondary | ICD-10-CM | POA: Diagnosis not present

## 2021-07-14 DIAGNOSIS — N186 End stage renal disease: Secondary | ICD-10-CM | POA: Diagnosis not present

## 2021-07-14 DIAGNOSIS — Z992 Dependence on renal dialysis: Secondary | ICD-10-CM | POA: Diagnosis not present

## 2021-07-14 DIAGNOSIS — N2581 Secondary hyperparathyroidism of renal origin: Secondary | ICD-10-CM | POA: Diagnosis not present

## 2021-07-16 DIAGNOSIS — N2581 Secondary hyperparathyroidism of renal origin: Secondary | ICD-10-CM | POA: Diagnosis not present

## 2021-07-16 DIAGNOSIS — Z992 Dependence on renal dialysis: Secondary | ICD-10-CM | POA: Diagnosis not present

## 2021-07-16 DIAGNOSIS — N186 End stage renal disease: Secondary | ICD-10-CM | POA: Diagnosis not present

## 2021-07-18 DIAGNOSIS — N186 End stage renal disease: Secondary | ICD-10-CM | POA: Diagnosis not present

## 2021-07-18 DIAGNOSIS — Z992 Dependence on renal dialysis: Secondary | ICD-10-CM | POA: Diagnosis not present

## 2021-07-18 DIAGNOSIS — N2581 Secondary hyperparathyroidism of renal origin: Secondary | ICD-10-CM | POA: Diagnosis not present

## 2021-07-21 DIAGNOSIS — Z992 Dependence on renal dialysis: Secondary | ICD-10-CM | POA: Diagnosis not present

## 2021-07-21 DIAGNOSIS — N186 End stage renal disease: Secondary | ICD-10-CM | POA: Diagnosis not present

## 2021-07-21 DIAGNOSIS — N2581 Secondary hyperparathyroidism of renal origin: Secondary | ICD-10-CM | POA: Diagnosis not present

## 2021-07-23 DIAGNOSIS — L84 Corns and callosities: Secondary | ICD-10-CM | POA: Diagnosis not present

## 2021-07-23 DIAGNOSIS — M79676 Pain in unspecified toe(s): Secondary | ICD-10-CM | POA: Diagnosis not present

## 2021-07-23 DIAGNOSIS — Z992 Dependence on renal dialysis: Secondary | ICD-10-CM | POA: Diagnosis not present

## 2021-07-23 DIAGNOSIS — E1142 Type 2 diabetes mellitus with diabetic polyneuropathy: Secondary | ICD-10-CM | POA: Diagnosis not present

## 2021-07-23 DIAGNOSIS — N186 End stage renal disease: Secondary | ICD-10-CM | POA: Diagnosis not present

## 2021-07-23 DIAGNOSIS — B351 Tinea unguium: Secondary | ICD-10-CM | POA: Diagnosis not present

## 2021-07-23 DIAGNOSIS — N2581 Secondary hyperparathyroidism of renal origin: Secondary | ICD-10-CM | POA: Diagnosis not present

## 2021-07-25 DIAGNOSIS — Z992 Dependence on renal dialysis: Secondary | ICD-10-CM | POA: Diagnosis not present

## 2021-07-25 DIAGNOSIS — N186 End stage renal disease: Secondary | ICD-10-CM | POA: Diagnosis not present

## 2021-07-25 DIAGNOSIS — N2581 Secondary hyperparathyroidism of renal origin: Secondary | ICD-10-CM | POA: Diagnosis not present

## 2021-07-28 DIAGNOSIS — N2581 Secondary hyperparathyroidism of renal origin: Secondary | ICD-10-CM | POA: Diagnosis not present

## 2021-07-28 DIAGNOSIS — Z992 Dependence on renal dialysis: Secondary | ICD-10-CM | POA: Diagnosis not present

## 2021-07-28 DIAGNOSIS — N186 End stage renal disease: Secondary | ICD-10-CM | POA: Diagnosis not present

## 2021-07-30 DIAGNOSIS — Z992 Dependence on renal dialysis: Secondary | ICD-10-CM | POA: Diagnosis not present

## 2021-07-30 DIAGNOSIS — N2581 Secondary hyperparathyroidism of renal origin: Secondary | ICD-10-CM | POA: Diagnosis not present

## 2021-07-30 DIAGNOSIS — N186 End stage renal disease: Secondary | ICD-10-CM | POA: Diagnosis not present

## 2021-08-01 DIAGNOSIS — N2581 Secondary hyperparathyroidism of renal origin: Secondary | ICD-10-CM | POA: Diagnosis not present

## 2021-08-01 DIAGNOSIS — N186 End stage renal disease: Secondary | ICD-10-CM | POA: Diagnosis not present

## 2021-08-01 DIAGNOSIS — Z992 Dependence on renal dialysis: Secondary | ICD-10-CM | POA: Diagnosis not present

## 2021-08-04 DIAGNOSIS — Z992 Dependence on renal dialysis: Secondary | ICD-10-CM | POA: Diagnosis not present

## 2021-08-04 DIAGNOSIS — N2581 Secondary hyperparathyroidism of renal origin: Secondary | ICD-10-CM | POA: Diagnosis not present

## 2021-08-04 DIAGNOSIS — N186 End stage renal disease: Secondary | ICD-10-CM | POA: Diagnosis not present

## 2021-08-05 DIAGNOSIS — Z992 Dependence on renal dialysis: Secondary | ICD-10-CM | POA: Diagnosis not present

## 2021-08-05 DIAGNOSIS — N041 Nephrotic syndrome with focal and segmental glomerular lesions: Secondary | ICD-10-CM | POA: Diagnosis not present

## 2021-08-05 DIAGNOSIS — N186 End stage renal disease: Secondary | ICD-10-CM | POA: Diagnosis not present

## 2021-08-06 DIAGNOSIS — N2581 Secondary hyperparathyroidism of renal origin: Secondary | ICD-10-CM | POA: Diagnosis not present

## 2021-08-06 DIAGNOSIS — Z992 Dependence on renal dialysis: Secondary | ICD-10-CM | POA: Diagnosis not present

## 2021-08-06 DIAGNOSIS — N186 End stage renal disease: Secondary | ICD-10-CM | POA: Diagnosis not present

## 2021-08-08 DIAGNOSIS — N186 End stage renal disease: Secondary | ICD-10-CM | POA: Diagnosis not present

## 2021-08-08 DIAGNOSIS — Z992 Dependence on renal dialysis: Secondary | ICD-10-CM | POA: Diagnosis not present

## 2021-08-08 DIAGNOSIS — N2581 Secondary hyperparathyroidism of renal origin: Secondary | ICD-10-CM | POA: Diagnosis not present

## 2021-08-11 DIAGNOSIS — Z992 Dependence on renal dialysis: Secondary | ICD-10-CM | POA: Diagnosis not present

## 2021-08-11 DIAGNOSIS — N2581 Secondary hyperparathyroidism of renal origin: Secondary | ICD-10-CM | POA: Diagnosis not present

## 2021-08-11 DIAGNOSIS — N186 End stage renal disease: Secondary | ICD-10-CM | POA: Diagnosis not present

## 2021-08-13 DIAGNOSIS — Z992 Dependence on renal dialysis: Secondary | ICD-10-CM | POA: Diagnosis not present

## 2021-08-13 DIAGNOSIS — N186 End stage renal disease: Secondary | ICD-10-CM | POA: Diagnosis not present

## 2021-08-13 DIAGNOSIS — N2581 Secondary hyperparathyroidism of renal origin: Secondary | ICD-10-CM | POA: Diagnosis not present

## 2021-08-15 DIAGNOSIS — Z992 Dependence on renal dialysis: Secondary | ICD-10-CM | POA: Diagnosis not present

## 2021-08-15 DIAGNOSIS — N186 End stage renal disease: Secondary | ICD-10-CM | POA: Diagnosis not present

## 2021-08-15 DIAGNOSIS — N2581 Secondary hyperparathyroidism of renal origin: Secondary | ICD-10-CM | POA: Diagnosis not present

## 2021-08-18 DIAGNOSIS — Z992 Dependence on renal dialysis: Secondary | ICD-10-CM | POA: Diagnosis not present

## 2021-08-18 DIAGNOSIS — N2581 Secondary hyperparathyroidism of renal origin: Secondary | ICD-10-CM | POA: Diagnosis not present

## 2021-08-18 DIAGNOSIS — N186 End stage renal disease: Secondary | ICD-10-CM | POA: Diagnosis not present

## 2021-08-20 DIAGNOSIS — N2581 Secondary hyperparathyroidism of renal origin: Secondary | ICD-10-CM | POA: Diagnosis not present

## 2021-08-20 DIAGNOSIS — N186 End stage renal disease: Secondary | ICD-10-CM | POA: Diagnosis not present

## 2021-08-20 DIAGNOSIS — Z992 Dependence on renal dialysis: Secondary | ICD-10-CM | POA: Diagnosis not present

## 2021-08-22 DIAGNOSIS — Z992 Dependence on renal dialysis: Secondary | ICD-10-CM | POA: Diagnosis not present

## 2021-08-22 DIAGNOSIS — N2581 Secondary hyperparathyroidism of renal origin: Secondary | ICD-10-CM | POA: Diagnosis not present

## 2021-08-22 DIAGNOSIS — N186 End stage renal disease: Secondary | ICD-10-CM | POA: Diagnosis not present

## 2021-08-25 DIAGNOSIS — N2581 Secondary hyperparathyroidism of renal origin: Secondary | ICD-10-CM | POA: Diagnosis not present

## 2021-08-25 DIAGNOSIS — N186 End stage renal disease: Secondary | ICD-10-CM | POA: Diagnosis not present

## 2021-08-25 DIAGNOSIS — Z992 Dependence on renal dialysis: Secondary | ICD-10-CM | POA: Diagnosis not present

## 2021-08-27 DIAGNOSIS — N2581 Secondary hyperparathyroidism of renal origin: Secondary | ICD-10-CM | POA: Diagnosis not present

## 2021-08-27 DIAGNOSIS — Z992 Dependence on renal dialysis: Secondary | ICD-10-CM | POA: Diagnosis not present

## 2021-08-27 DIAGNOSIS — N186 End stage renal disease: Secondary | ICD-10-CM | POA: Diagnosis not present

## 2021-08-29 DIAGNOSIS — N2581 Secondary hyperparathyroidism of renal origin: Secondary | ICD-10-CM | POA: Diagnosis not present

## 2021-08-29 DIAGNOSIS — N186 End stage renal disease: Secondary | ICD-10-CM | POA: Diagnosis not present

## 2021-08-29 DIAGNOSIS — Z992 Dependence on renal dialysis: Secondary | ICD-10-CM | POA: Diagnosis not present

## 2021-09-01 DIAGNOSIS — N186 End stage renal disease: Secondary | ICD-10-CM | POA: Diagnosis not present

## 2021-09-01 DIAGNOSIS — Z992 Dependence on renal dialysis: Secondary | ICD-10-CM | POA: Diagnosis not present

## 2021-09-01 DIAGNOSIS — N2581 Secondary hyperparathyroidism of renal origin: Secondary | ICD-10-CM | POA: Diagnosis not present

## 2021-09-03 DIAGNOSIS — N186 End stage renal disease: Secondary | ICD-10-CM | POA: Diagnosis not present

## 2021-09-03 DIAGNOSIS — Z992 Dependence on renal dialysis: Secondary | ICD-10-CM | POA: Diagnosis not present

## 2021-09-03 DIAGNOSIS — N2581 Secondary hyperparathyroidism of renal origin: Secondary | ICD-10-CM | POA: Diagnosis not present

## 2021-09-04 DIAGNOSIS — Z992 Dependence on renal dialysis: Secondary | ICD-10-CM | POA: Diagnosis not present

## 2021-09-04 DIAGNOSIS — N186 End stage renal disease: Secondary | ICD-10-CM | POA: Diagnosis not present

## 2021-09-04 DIAGNOSIS — N041 Nephrotic syndrome with focal and segmental glomerular lesions: Secondary | ICD-10-CM | POA: Diagnosis not present

## 2021-09-05 DIAGNOSIS — N2581 Secondary hyperparathyroidism of renal origin: Secondary | ICD-10-CM | POA: Diagnosis not present

## 2021-09-05 DIAGNOSIS — Z992 Dependence on renal dialysis: Secondary | ICD-10-CM | POA: Diagnosis not present

## 2021-09-05 DIAGNOSIS — N186 End stage renal disease: Secondary | ICD-10-CM | POA: Diagnosis not present

## 2021-09-08 DIAGNOSIS — N2581 Secondary hyperparathyroidism of renal origin: Secondary | ICD-10-CM | POA: Diagnosis not present

## 2021-09-08 DIAGNOSIS — Z992 Dependence on renal dialysis: Secondary | ICD-10-CM | POA: Diagnosis not present

## 2021-09-08 DIAGNOSIS — N186 End stage renal disease: Secondary | ICD-10-CM | POA: Diagnosis not present

## 2021-09-10 DIAGNOSIS — Z992 Dependence on renal dialysis: Secondary | ICD-10-CM | POA: Diagnosis not present

## 2021-09-10 DIAGNOSIS — N2581 Secondary hyperparathyroidism of renal origin: Secondary | ICD-10-CM | POA: Diagnosis not present

## 2021-09-10 DIAGNOSIS — N186 End stage renal disease: Secondary | ICD-10-CM | POA: Diagnosis not present

## 2021-09-12 DIAGNOSIS — Z992 Dependence on renal dialysis: Secondary | ICD-10-CM | POA: Diagnosis not present

## 2021-09-12 DIAGNOSIS — N2581 Secondary hyperparathyroidism of renal origin: Secondary | ICD-10-CM | POA: Diagnosis not present

## 2021-09-12 DIAGNOSIS — N186 End stage renal disease: Secondary | ICD-10-CM | POA: Diagnosis not present

## 2021-09-15 DIAGNOSIS — Z992 Dependence on renal dialysis: Secondary | ICD-10-CM | POA: Diagnosis not present

## 2021-09-15 DIAGNOSIS — N186 End stage renal disease: Secondary | ICD-10-CM | POA: Diagnosis not present

## 2021-09-15 DIAGNOSIS — N2581 Secondary hyperparathyroidism of renal origin: Secondary | ICD-10-CM | POA: Diagnosis not present

## 2021-09-17 DIAGNOSIS — Z992 Dependence on renal dialysis: Secondary | ICD-10-CM | POA: Diagnosis not present

## 2021-09-17 DIAGNOSIS — C9000 Multiple myeloma not having achieved remission: Secondary | ICD-10-CM | POA: Diagnosis not present

## 2021-09-17 DIAGNOSIS — N2581 Secondary hyperparathyroidism of renal origin: Secondary | ICD-10-CM | POA: Diagnosis not present

## 2021-09-17 DIAGNOSIS — N186 End stage renal disease: Secondary | ICD-10-CM | POA: Diagnosis not present

## 2021-09-19 DIAGNOSIS — N2581 Secondary hyperparathyroidism of renal origin: Secondary | ICD-10-CM | POA: Diagnosis not present

## 2021-09-19 DIAGNOSIS — N186 End stage renal disease: Secondary | ICD-10-CM | POA: Diagnosis not present

## 2021-09-19 DIAGNOSIS — Z992 Dependence on renal dialysis: Secondary | ICD-10-CM | POA: Diagnosis not present

## 2021-09-22 DIAGNOSIS — Z992 Dependence on renal dialysis: Secondary | ICD-10-CM | POA: Diagnosis not present

## 2021-09-22 DIAGNOSIS — N2581 Secondary hyperparathyroidism of renal origin: Secondary | ICD-10-CM | POA: Diagnosis not present

## 2021-09-22 DIAGNOSIS — N186 End stage renal disease: Secondary | ICD-10-CM | POA: Diagnosis not present

## 2021-09-24 DIAGNOSIS — N186 End stage renal disease: Secondary | ICD-10-CM | POA: Diagnosis not present

## 2021-09-24 DIAGNOSIS — N2581 Secondary hyperparathyroidism of renal origin: Secondary | ICD-10-CM | POA: Diagnosis not present

## 2021-09-24 DIAGNOSIS — Z992 Dependence on renal dialysis: Secondary | ICD-10-CM | POA: Diagnosis not present

## 2021-09-26 DIAGNOSIS — Z992 Dependence on renal dialysis: Secondary | ICD-10-CM | POA: Diagnosis not present

## 2021-09-26 DIAGNOSIS — N2581 Secondary hyperparathyroidism of renal origin: Secondary | ICD-10-CM | POA: Diagnosis not present

## 2021-09-26 DIAGNOSIS — N186 End stage renal disease: Secondary | ICD-10-CM | POA: Diagnosis not present

## 2021-09-29 DIAGNOSIS — Z992 Dependence on renal dialysis: Secondary | ICD-10-CM | POA: Diagnosis not present

## 2021-09-29 DIAGNOSIS — N186 End stage renal disease: Secondary | ICD-10-CM | POA: Diagnosis not present

## 2021-09-29 DIAGNOSIS — N2581 Secondary hyperparathyroidism of renal origin: Secondary | ICD-10-CM | POA: Diagnosis not present

## 2021-10-01 DIAGNOSIS — E1142 Type 2 diabetes mellitus with diabetic polyneuropathy: Secondary | ICD-10-CM | POA: Diagnosis not present

## 2021-10-01 DIAGNOSIS — L84 Corns and callosities: Secondary | ICD-10-CM | POA: Diagnosis not present

## 2021-10-01 DIAGNOSIS — B351 Tinea unguium: Secondary | ICD-10-CM | POA: Diagnosis not present

## 2021-10-01 DIAGNOSIS — Z992 Dependence on renal dialysis: Secondary | ICD-10-CM | POA: Diagnosis not present

## 2021-10-01 DIAGNOSIS — M79676 Pain in unspecified toe(s): Secondary | ICD-10-CM | POA: Diagnosis not present

## 2021-10-01 DIAGNOSIS — N2581 Secondary hyperparathyroidism of renal origin: Secondary | ICD-10-CM | POA: Diagnosis not present

## 2021-10-01 DIAGNOSIS — N186 End stage renal disease: Secondary | ICD-10-CM | POA: Diagnosis not present

## 2021-10-03 DIAGNOSIS — N186 End stage renal disease: Secondary | ICD-10-CM | POA: Diagnosis not present

## 2021-10-03 DIAGNOSIS — Z992 Dependence on renal dialysis: Secondary | ICD-10-CM | POA: Diagnosis not present

## 2021-10-03 DIAGNOSIS — N2581 Secondary hyperparathyroidism of renal origin: Secondary | ICD-10-CM | POA: Diagnosis not present

## 2021-10-05 DIAGNOSIS — N041 Nephrotic syndrome with focal and segmental glomerular lesions: Secondary | ICD-10-CM | POA: Diagnosis not present

## 2021-10-05 DIAGNOSIS — N186 End stage renal disease: Secondary | ICD-10-CM | POA: Diagnosis not present

## 2021-10-05 DIAGNOSIS — Z992 Dependence on renal dialysis: Secondary | ICD-10-CM | POA: Diagnosis not present

## 2021-10-06 DIAGNOSIS — N186 End stage renal disease: Secondary | ICD-10-CM | POA: Diagnosis not present

## 2021-10-06 DIAGNOSIS — N2581 Secondary hyperparathyroidism of renal origin: Secondary | ICD-10-CM | POA: Diagnosis not present

## 2021-10-06 DIAGNOSIS — Z992 Dependence on renal dialysis: Secondary | ICD-10-CM | POA: Diagnosis not present

## 2021-10-08 DIAGNOSIS — Z992 Dependence on renal dialysis: Secondary | ICD-10-CM | POA: Diagnosis not present

## 2021-10-08 DIAGNOSIS — N186 End stage renal disease: Secondary | ICD-10-CM | POA: Diagnosis not present

## 2021-10-08 DIAGNOSIS — N2581 Secondary hyperparathyroidism of renal origin: Secondary | ICD-10-CM | POA: Diagnosis not present

## 2021-10-10 DIAGNOSIS — N186 End stage renal disease: Secondary | ICD-10-CM | POA: Diagnosis not present

## 2021-10-10 DIAGNOSIS — Z992 Dependence on renal dialysis: Secondary | ICD-10-CM | POA: Diagnosis not present

## 2021-10-10 DIAGNOSIS — N2581 Secondary hyperparathyroidism of renal origin: Secondary | ICD-10-CM | POA: Diagnosis not present

## 2021-10-13 DIAGNOSIS — N186 End stage renal disease: Secondary | ICD-10-CM | POA: Diagnosis not present

## 2021-10-13 DIAGNOSIS — N2581 Secondary hyperparathyroidism of renal origin: Secondary | ICD-10-CM | POA: Diagnosis not present

## 2021-10-13 DIAGNOSIS — Z992 Dependence on renal dialysis: Secondary | ICD-10-CM | POA: Diagnosis not present

## 2021-10-15 DIAGNOSIS — Z992 Dependence on renal dialysis: Secondary | ICD-10-CM | POA: Diagnosis not present

## 2021-10-15 DIAGNOSIS — N2581 Secondary hyperparathyroidism of renal origin: Secondary | ICD-10-CM | POA: Diagnosis not present

## 2021-10-15 DIAGNOSIS — N186 End stage renal disease: Secondary | ICD-10-CM | POA: Diagnosis not present

## 2021-10-17 DIAGNOSIS — N2581 Secondary hyperparathyroidism of renal origin: Secondary | ICD-10-CM | POA: Diagnosis not present

## 2021-10-17 DIAGNOSIS — N186 End stage renal disease: Secondary | ICD-10-CM | POA: Diagnosis not present

## 2021-10-17 DIAGNOSIS — Z992 Dependence on renal dialysis: Secondary | ICD-10-CM | POA: Diagnosis not present

## 2021-10-20 DIAGNOSIS — N186 End stage renal disease: Secondary | ICD-10-CM | POA: Diagnosis not present

## 2021-10-20 DIAGNOSIS — N2581 Secondary hyperparathyroidism of renal origin: Secondary | ICD-10-CM | POA: Diagnosis not present

## 2021-10-20 DIAGNOSIS — Z992 Dependence on renal dialysis: Secondary | ICD-10-CM | POA: Diagnosis not present

## 2021-10-22 DIAGNOSIS — N2581 Secondary hyperparathyroidism of renal origin: Secondary | ICD-10-CM | POA: Diagnosis not present

## 2021-10-22 DIAGNOSIS — Z992 Dependence on renal dialysis: Secondary | ICD-10-CM | POA: Diagnosis not present

## 2021-10-22 DIAGNOSIS — N186 End stage renal disease: Secondary | ICD-10-CM | POA: Diagnosis not present

## 2021-10-24 DIAGNOSIS — N2581 Secondary hyperparathyroidism of renal origin: Secondary | ICD-10-CM | POA: Diagnosis not present

## 2021-10-24 DIAGNOSIS — Z992 Dependence on renal dialysis: Secondary | ICD-10-CM | POA: Diagnosis not present

## 2021-10-24 DIAGNOSIS — N186 End stage renal disease: Secondary | ICD-10-CM | POA: Diagnosis not present

## 2021-10-27 DIAGNOSIS — Z992 Dependence on renal dialysis: Secondary | ICD-10-CM | POA: Diagnosis not present

## 2021-10-27 DIAGNOSIS — N2581 Secondary hyperparathyroidism of renal origin: Secondary | ICD-10-CM | POA: Diagnosis not present

## 2021-10-27 DIAGNOSIS — N186 End stage renal disease: Secondary | ICD-10-CM | POA: Diagnosis not present

## 2021-10-30 DIAGNOSIS — Z992 Dependence on renal dialysis: Secondary | ICD-10-CM | POA: Diagnosis not present

## 2021-10-30 DIAGNOSIS — N186 End stage renal disease: Secondary | ICD-10-CM | POA: Diagnosis not present

## 2021-10-30 DIAGNOSIS — N2581 Secondary hyperparathyroidism of renal origin: Secondary | ICD-10-CM | POA: Diagnosis not present

## 2021-11-01 DIAGNOSIS — N186 End stage renal disease: Secondary | ICD-10-CM | POA: Diagnosis not present

## 2021-11-01 DIAGNOSIS — Z992 Dependence on renal dialysis: Secondary | ICD-10-CM | POA: Diagnosis not present

## 2021-11-01 DIAGNOSIS — N2581 Secondary hyperparathyroidism of renal origin: Secondary | ICD-10-CM | POA: Diagnosis not present

## 2021-11-03 DIAGNOSIS — N186 End stage renal disease: Secondary | ICD-10-CM | POA: Diagnosis not present

## 2021-11-03 DIAGNOSIS — Z992 Dependence on renal dialysis: Secondary | ICD-10-CM | POA: Diagnosis not present

## 2021-11-03 DIAGNOSIS — N2581 Secondary hyperparathyroidism of renal origin: Secondary | ICD-10-CM | POA: Diagnosis not present

## 2021-11-04 DIAGNOSIS — Z992 Dependence on renal dialysis: Secondary | ICD-10-CM | POA: Diagnosis not present

## 2021-11-04 DIAGNOSIS — N041 Nephrotic syndrome with focal and segmental glomerular lesions: Secondary | ICD-10-CM | POA: Diagnosis not present

## 2021-11-04 DIAGNOSIS — N186 End stage renal disease: Secondary | ICD-10-CM | POA: Diagnosis not present

## 2021-11-05 DIAGNOSIS — N2581 Secondary hyperparathyroidism of renal origin: Secondary | ICD-10-CM | POA: Diagnosis not present

## 2021-11-05 DIAGNOSIS — N186 End stage renal disease: Secondary | ICD-10-CM | POA: Diagnosis not present

## 2021-11-05 DIAGNOSIS — Z992 Dependence on renal dialysis: Secondary | ICD-10-CM | POA: Diagnosis not present

## 2021-11-07 DIAGNOSIS — N2581 Secondary hyperparathyroidism of renal origin: Secondary | ICD-10-CM | POA: Diagnosis not present

## 2021-11-07 DIAGNOSIS — N186 End stage renal disease: Secondary | ICD-10-CM | POA: Diagnosis not present

## 2021-11-07 DIAGNOSIS — Z992 Dependence on renal dialysis: Secondary | ICD-10-CM | POA: Diagnosis not present

## 2021-11-10 DIAGNOSIS — N2581 Secondary hyperparathyroidism of renal origin: Secondary | ICD-10-CM | POA: Diagnosis not present

## 2021-11-10 DIAGNOSIS — Z992 Dependence on renal dialysis: Secondary | ICD-10-CM | POA: Diagnosis not present

## 2021-11-10 DIAGNOSIS — N186 End stage renal disease: Secondary | ICD-10-CM | POA: Diagnosis not present

## 2021-11-12 DIAGNOSIS — Z992 Dependence on renal dialysis: Secondary | ICD-10-CM | POA: Diagnosis not present

## 2021-11-12 DIAGNOSIS — N2581 Secondary hyperparathyroidism of renal origin: Secondary | ICD-10-CM | POA: Diagnosis not present

## 2021-11-12 DIAGNOSIS — N186 End stage renal disease: Secondary | ICD-10-CM | POA: Diagnosis not present

## 2021-11-14 DIAGNOSIS — N2581 Secondary hyperparathyroidism of renal origin: Secondary | ICD-10-CM | POA: Diagnosis not present

## 2021-11-14 DIAGNOSIS — Z992 Dependence on renal dialysis: Secondary | ICD-10-CM | POA: Diagnosis not present

## 2021-11-14 DIAGNOSIS — N186 End stage renal disease: Secondary | ICD-10-CM | POA: Diagnosis not present

## 2021-11-17 DIAGNOSIS — N186 End stage renal disease: Secondary | ICD-10-CM | POA: Diagnosis not present

## 2021-11-17 DIAGNOSIS — N2581 Secondary hyperparathyroidism of renal origin: Secondary | ICD-10-CM | POA: Diagnosis not present

## 2021-11-17 DIAGNOSIS — Z992 Dependence on renal dialysis: Secondary | ICD-10-CM | POA: Diagnosis not present

## 2021-11-19 DIAGNOSIS — N186 End stage renal disease: Secondary | ICD-10-CM | POA: Diagnosis not present

## 2021-11-19 DIAGNOSIS — N2581 Secondary hyperparathyroidism of renal origin: Secondary | ICD-10-CM | POA: Diagnosis not present

## 2021-11-19 DIAGNOSIS — Z992 Dependence on renal dialysis: Secondary | ICD-10-CM | POA: Diagnosis not present

## 2021-11-21 DIAGNOSIS — N186 End stage renal disease: Secondary | ICD-10-CM | POA: Diagnosis not present

## 2021-11-21 DIAGNOSIS — Z992 Dependence on renal dialysis: Secondary | ICD-10-CM | POA: Diagnosis not present

## 2021-11-21 DIAGNOSIS — N2581 Secondary hyperparathyroidism of renal origin: Secondary | ICD-10-CM | POA: Diagnosis not present

## 2021-11-24 DIAGNOSIS — Z992 Dependence on renal dialysis: Secondary | ICD-10-CM | POA: Diagnosis not present

## 2021-11-24 DIAGNOSIS — N186 End stage renal disease: Secondary | ICD-10-CM | POA: Diagnosis not present

## 2021-11-24 DIAGNOSIS — N2581 Secondary hyperparathyroidism of renal origin: Secondary | ICD-10-CM | POA: Diagnosis not present

## 2021-11-26 DIAGNOSIS — N186 End stage renal disease: Secondary | ICD-10-CM | POA: Diagnosis not present

## 2021-11-26 DIAGNOSIS — Z992 Dependence on renal dialysis: Secondary | ICD-10-CM | POA: Diagnosis not present

## 2021-11-26 DIAGNOSIS — N2581 Secondary hyperparathyroidism of renal origin: Secondary | ICD-10-CM | POA: Diagnosis not present

## 2021-11-28 DIAGNOSIS — Z992 Dependence on renal dialysis: Secondary | ICD-10-CM | POA: Diagnosis not present

## 2021-11-28 DIAGNOSIS — N186 End stage renal disease: Secondary | ICD-10-CM | POA: Diagnosis not present

## 2021-11-28 DIAGNOSIS — N2581 Secondary hyperparathyroidism of renal origin: Secondary | ICD-10-CM | POA: Diagnosis not present

## 2021-11-30 ENCOUNTER — Emergency Department (HOSPITAL_COMMUNITY)
Admission: EM | Admit: 2021-11-30 | Discharge: 2021-11-30 | Disposition: A | Discharge status: Home / Self Care | Payer: Medicare HMO | Attending: Emergency Medicine | Admitting: Emergency Medicine

## 2021-11-30 ENCOUNTER — Encounter (HOSPITAL_COMMUNITY): Payer: Self-pay | Admitting: Emergency Medicine

## 2021-11-30 ENCOUNTER — Other Ambulatory Visit: Payer: Self-pay

## 2021-11-30 DIAGNOSIS — K920 Hematemesis: Secondary | ICD-10-CM | POA: Diagnosis not present

## 2021-11-30 DIAGNOSIS — I132 Hypertensive heart and chronic kidney disease with heart failure and with stage 5 chronic kidney disease, or end stage renal disease: Secondary | ICD-10-CM | POA: Insufficient documentation

## 2021-11-30 DIAGNOSIS — Z992 Dependence on renal dialysis: Secondary | ICD-10-CM | POA: Insufficient documentation

## 2021-11-30 DIAGNOSIS — Z79899 Other long term (current) drug therapy: Secondary | ICD-10-CM | POA: Insufficient documentation

## 2021-11-30 DIAGNOSIS — J45909 Unspecified asthma, uncomplicated: Secondary | ICD-10-CM | POA: Insufficient documentation

## 2021-11-30 DIAGNOSIS — Z87891 Personal history of nicotine dependence: Secondary | ICD-10-CM | POA: Insufficient documentation

## 2021-11-30 DIAGNOSIS — R110 Nausea: Secondary | ICD-10-CM | POA: Diagnosis not present

## 2021-11-30 DIAGNOSIS — N186 End stage renal disease: Secondary | ICD-10-CM | POA: Insufficient documentation

## 2021-11-30 DIAGNOSIS — R112 Nausea with vomiting, unspecified: Secondary | ICD-10-CM | POA: Diagnosis not present

## 2021-11-30 DIAGNOSIS — I5022 Chronic systolic (congestive) heart failure: Secondary | ICD-10-CM | POA: Insufficient documentation

## 2021-11-30 DIAGNOSIS — I10 Essential (primary) hypertension: Secondary | ICD-10-CM | POA: Diagnosis not present

## 2021-11-30 DIAGNOSIS — E1122 Type 2 diabetes mellitus with diabetic chronic kidney disease: Secondary | ICD-10-CM | POA: Insufficient documentation

## 2021-11-30 DIAGNOSIS — Z794 Long term (current) use of insulin: Secondary | ICD-10-CM | POA: Insufficient documentation

## 2021-11-30 LAB — COMPREHENSIVE METABOLIC PANEL
ALT: 9 U/L (ref 0–44)
AST: 10 U/L — ABNORMAL LOW (ref 15–41)
Albumin: 3.3 g/dL — ABNORMAL LOW (ref 3.5–5.0)
Alkaline Phosphatase: 72 U/L (ref 38–126)
Anion gap: 13 (ref 5–15)
BUN: 40 mg/dL — ABNORMAL HIGH (ref 8–23)
CO2: 25 mmol/L (ref 22–32)
Calcium: 8.8 mg/dL — ABNORMAL LOW (ref 8.9–10.3)
Chloride: 90 mmol/L — ABNORMAL LOW (ref 98–111)
Creatinine, Ser: 10.9 mg/dL — ABNORMAL HIGH (ref 0.61–1.24)
GFR, Estimated: 5 mL/min — ABNORMAL LOW (ref >60)
Glucose, Bld: 207 mg/dL — ABNORMAL HIGH (ref 70–99)
Potassium: 3 mmol/L — ABNORMAL LOW (ref 3.5–5.1)
Sodium: 128 mmol/L — ABNORMAL LOW (ref 135–145)
Total Bilirubin: 0.8 mg/dL (ref 0.3–1.2)
Total Protein: 9.6 g/dL — ABNORMAL HIGH (ref 6.5–8.1)

## 2021-11-30 LAB — TROPONIN I (HIGH SENSITIVITY)
Troponin I (High Sensitivity): 20 ng/L — ABNORMAL HIGH (ref <18)
Troponin I (High Sensitivity): 22 ng/L — ABNORMAL HIGH (ref <18)

## 2021-11-30 LAB — CBC
HCT: 48.2 % (ref 39.0–52.0)
Hemoglobin: 15.8 g/dL (ref 13.0–17.0)
MCH: 31.1 pg (ref 26.0–34.0)
MCHC: 32.8 g/dL (ref 30.0–36.0)
MCV: 94.9 fL (ref 80.0–100.0)
Platelets: 205 10*3/uL (ref 150–400)
RBC: 5.08 MIL/uL (ref 4.22–5.81)
RDW: 14.1 % (ref 11.5–15.5)
WBC: 5.7 10*3/uL (ref 4.0–10.5)
nRBC: 0 % (ref 0.0–0.2)

## 2021-11-30 LAB — I-STAT CHEM 8, ED
BUN: 47 mg/dL — ABNORMAL HIGH (ref 8–23)
Calcium, Ion: 0.88 mmol/L — CL (ref 1.15–1.40)
Chloride: 96 mmol/L — ABNORMAL LOW (ref 98–111)
Creatinine, Ser: 11.8 mg/dL — ABNORMAL HIGH (ref 0.61–1.24)
Glucose, Bld: 187 mg/dL — ABNORMAL HIGH (ref 70–99)
HCT: 50 % (ref 39.0–52.0)
Hemoglobin: 17 g/dL (ref 13.0–17.0)
Potassium: 3 mmol/L — ABNORMAL LOW (ref 3.5–5.1)
Sodium: 129 mmol/L — ABNORMAL LOW (ref 135–145)
TCO2: 26 mmol/L (ref 22–32)

## 2021-11-30 LAB — TYPE AND SCREEN
ABO/RH(D): O NEG
Antibody Screen: NEGATIVE

## 2021-11-30 LAB — LIPASE, BLOOD: Lipase: 47 U/L (ref 11–51)

## 2021-11-30 MED ORDER — ONDANSETRON HCL 4 MG PO TABS: 4.0000 mg | ORAL_TABLET | Freq: Three times a day (TID) | ORAL | 0 refills | Status: DC | PRN

## 2021-11-30 MED ORDER — PANTOPRAZOLE SODIUM 20 MG PO TBEC: 20.0000 mg | DELAYED_RELEASE_TABLET | Freq: Every day | ORAL | 0 refills | Status: AC

## 2021-11-30 MED ORDER — PANTOPRAZOLE SODIUM 40 MG IV SOLR
40.0000 mg | Freq: Once | INTRAVENOUS | Status: AC
  Administered 2021-11-30: 12:00:00 40 mg via INTRAVENOUS
  Filled 2021-11-30: qty 40

## 2021-11-30 NOTE — ED Triage Notes (Signed)
Pt reports nausea, vomiting, diarrhea since Friday. Reports upper abdominal pain described as heartburn. Today noted to have grossly bloody emesis x2. Pt is Tues, Thursaday, SAt dialysis. Appears stable at present.

## 2021-11-30 NOTE — ED Provider Notes (Signed)
Emergency Medicine Provider Triage Evaluation Note  James [name] , a 65 y.o. male  was evaluated in triage.  Pt complains of vomiting blood since this morning. Patient states he has been throwing up excessively since Friday but today the vomitus appeared to have blood in it. Patient denies history of cirrhosis or liver issues. Patient currently sitting upright with reassuring vital signs.  Review of Systems  Positive: Hematemesis, diarrhea, epigastric abdominal pain, nausea, vomiting Negative: Blood in stool, fevers, CP, SOB  Physical Exam  BP 118/71 (BP Location: Right Arm)    Pulse 98    Temp 99 F (37.2 C) (Oral)    Resp 20    SpO2 100%  Gen:   Awake, no distress   Resp:  Normal effort  MSK:   Moves extremities without difficulty  Other:  Epigastric abdominal pain  Medical Decision Making  Medically screening exam initiated at 9:30 AM.  Appropriate orders placed.  James [name] was informed that the remainder of the evaluation will be completed by another provider, this initial triage assessment does not replace that evaluation, and the importance of remaining in the ED until their evaluation is complete.     Azucena Cecil, PA-C 11/30/21 [place], [place], DO 11/30/21 1313

## 2021-11-30 NOTE — ED Provider Notes (Signed)
[facility] EMERGENCY DEPARTMENT Provider Note   CSN: [id number] Arrival date & time: 11/30/21  7628     History Chief Complaint  Patient presents with   Hematemesis    James [name] is a 65 y.o. male with a pmh of CHF, DM, ESRD on HD t/th/sat- last full dialysis 2 days ago. He presents for hematemesis. He co of onset of n/v/d on Thursday of last week. He had a child family member w/ similar sxs. Patient had burning epigastric pain, multiple episodes of nbnb vomitus and liquid stools. He states that at 4:00 am he was vomiting into a bucket by his bed and he vomited bright red blood "like the exorcist." He had a second episode that was less voluminous.  States that he is feeling much better.  He has never had anything like this before.  He continues to have some burning epigastric pain but denies chest pain or shortness of breath. Denies melena or hematochezia  HPI     Past Medical History:  Diagnosis Date   Asthma    as a child   CHF (congestive heart failure) ([place])    Chronic kidney disease    TTHS   Diabetes mellitus without complication ([place])    Type II   ESRD (end stage renal disease) ([place])    Hypertension     Patient Active Problem List   Diagnosis Date Noted   Type 2 diabetes mellitus with hypertension and end stage renal disease on dialysis ([place]) 09/05/2017   Type 2 diabetes mellitus with diabetic neuropathy, unspecified ([place]) 09/05/2017   Anemia associated with chronic renal failure 31/51/7616   Chronic systolic heart failure ([place]) 08/03/2016   Multiple myeloma (H[facility])    HTN (hypertension) 03/22/2015   DM (diabetes mellitus) ([place]) 03/22/2015   ESRD on dialysis ([place]) 03/22/2015   Anemia 03/22/2015    Past Surgical History:  Procedure Laterality Date   AV FISTULA PLACEMENT Left 04/08/2015   Procedure: LEFT RADIOCEPHALIC ARTERIOVENOUS (AV) FISTULA CREATION;  Surgeon: Elam Dutch, MD;  Location: [place];  Service: Vascular;  Laterality: Left;    AV FISTULA PLACEMENT Right 09/15/2015   Procedure: RADIOCEPHALIC VERSUS BRACHIOCEPHALIC ARTERIOVENOUS (AV) FISTULA CREATION - RIGHT ARM;  Surgeon: Conrad War, MD;  Location: [place];  Service: Vascular;  Laterality: Right;   AV FISTULA PLACEMENT Left 06/07/2016   Procedure: LEFT BRACHIOCEPHALIC ARTERIOVENOUS (AV) FISTULA CREATION;  Surgeon: Elam Dutch, MD;  Location: [place];  Service: Vascular;  Laterality: Left;   COLONOSCOPY     IR GENERIC HISTORICAL  08/04/2016   IR REMOVAL TUN CV CATH W/O FL 08/04/2016 Ascencion Dike, PA-C [facility]   IR GENERIC HISTORICAL  08/06/2016   IR FLUORO GUIDE CV LINE LEFT 08/06/2016 Arne Cleveland, MD [facility]   IR GENERIC HISTORICAL  08/06/2016   IR US GUIDE VASC ACCESS LEFT 08/06/2016 Arne Cleveland, MD [facility]   LIGATION OF ARTERIOVENOUS  FISTULA Left 09/15/2015   Procedure: LIGATION OF RADIOCEPHALIC ARTERIOVENOUS FISTULA - LEFT ARM;  Surgeon: Conrad [place], MD;  Location: [place];  Service: Vascular;  Laterality: Left;   LIGATION OF ARTERIOVENOUS  FISTULA Right 04/16/2016   Procedure: LIGATION OF ARTERIOVENOUS  FISTULA;  Surgeon: Elam Dutch, MD;  Location: [facility];  Service: Vascular;  Laterality: Right;   LIGATION OF COMPETING BRANCHES OF ARTERIOVENOUS FISTULA Left 07/16/2015   Procedure: LIGATION OF SIDE BRANCHES OF ARTERIOVENOUS FISTULA LEFT ARM;  Surgeon: Rosetta Posner, MD;  Location: [place];  Service: Vascular;  Laterality: Left;   PERIPHERAL VASCULAR CATHETERIZATION N/A 07/04/2015   Procedure: Fistulagram;  Surgeon: Elam Dutch, MD;  Location: [place] [facility];  Service: Cardiovascular;  Laterality: N/A;   PERIPHERAL VASCULAR CATHETERIZATION N/A 09/08/2015   Procedure: Fistulagram;  Surgeon: Conrad Blackwater, MD;  Location: [place] CV LAB;  Service: Cardiovascular;  Laterality: N/A;   PERIPHERAL VASCULAR CATHETERIZATION Right 02/23/2016   Procedure: Fistulagram;  Surgeon: Conrad [place], MD;  Location: [place] CV LAB;  Service:  Cardiovascular;  Laterality: Right;   PERIPHERAL VASCULAR CATHETERIZATION N/A 04/12/2016   Procedure: Fistulagram;  Surgeon: Conrad [place], MD;  Location: [place] CV LAB;  Service: Cardiovascular;  Laterality: N/A;   REVISON OF ARTERIOVENOUS FISTULA Right 03/10/2016   Procedure: RIGHT CEPHALIC VEIN TURNDOWN;  Surgeon: Conrad Guys, MD;  Location: [place];  Service: Vascular;  Laterality: Right;   TEE WITHOUT CARDIOVERSION N/A 08/06/2016   Procedure: TRANSESOPHAGEAL ECHOCARDIOGRAM (TEE);  Surgeon: Dixie Dials, MD;  Location: [facility] ENDOSCOPY;  Service: Cardiovascular;  Laterality: N/A;       Family History  Problem Relation Age of Onset   Hypertension Other     Social History   Tobacco Use   Smoking status: Former    Years: 1.00    Types: Cigarettes    Quit date: 07/08/2005    Years since quitting: 16.4   Smokeless tobacco: Never  Substance Use Topics   Alcohol use: No    Alcohol/week: 0.0 standard drinks   Drug use: No    Home Medications Prior to Admission medications   Medication Sig Start Date End Date Taking? Authorizing Provider  calcium acetate (PHOSLO) 667 MG capsule Take 2 capsules (1,334 mg total) by mouth 3 (three) times daily with meals. 04/12/15  Yes Hongalgi, Lenis Dickinson, MD  ceFAZolin (ANCEF) 2-4 GM/100ML-% IVPB Inject 100 mLs (2 g total) into the vein Every Tuesday,Thursday,and Saturday with dialysis. 08/07/16  Yes Nita Sells, MD  Darbepoetin Alfa (ARANESP) 100 MCG/0.5ML SOSY injection Inject 0.5 mLs (100 mcg total) into the vein every Thursday with hemodialysis. Will be given at dialysis. 04/12/15  Yes Hongalgi, Lenis Dickinson, MD  gabapentin (NEURONTIN) 300 MG capsule Take 1 capsule (300 mg total) by mouth at bedtime. 09/05/17  Yes Dettinger, Fransisca Kaufmann, MD  glucose blood (ONETOUCH VERIO) test strip Test three times daily Patient taking differently: 1 each by Other route in the morning, at noon, and at bedtime. 08/27/16  Yes Wardell Honour, MD  isosorbide mononitrate (IMDUR) 60  MG 24 hr tablet Take 1 tablet (60 mg total) by mouth daily. 04/12/15  Yes Hongalgi, Lenis Dickinson, MD  ondansetron (ZOFRAN) 4 MG tablet Take 1 tablet (4 mg total) by mouth every 8 (eight) hours as needed for nausea or vomiting. 11/30/21  Yes Kennette Cuthrell, PA-C  pantoprazole (PROTONIX) 20 MG tablet Take 1 tablet (20 mg total) by mouth daily. 11/30/21  Yes Mckenleigh Tarlton, PA-C  Vitamin D, Ergocalciferol, (DRISDOL) 50000 units CAPS capsule Take 50,000 Units by mouth 3 (three) times a week. On dialysis days   Yes [provider]  insulin aspart (NOVOLOG) 100 UNIT/ML injection Inject 6 Units into the skin 3 (three) times daily before meals. Patient not taking: Reported on 07/16/2019 07/28/16   Wardell Honour, MD  insulin detemir (LEVEMIR) 100 UNIT/ML injection Inject 0.4 mLs (40 Units total) into the skin at bedtime. Patient not taking: Reported on 07/16/2019 01/10/17   Wardell Honour, MD    Allergies  Patient has no known allergies.  Review of Systems   Review of Systems Ten systems reviewed and are negative for acute change, except as noted in the HPI.   Physical Exam Updated Vital Signs BP 121/61    Pulse 84    Temp 99 F (37.2 C) (Oral)    Resp 17    SpO2 94%   Physical Exam Vitals and nursing note reviewed.  Constitutional:      General: He is not in acute distress.    Appearance: He is well-developed. He is not diaphoretic.  HENT:     Head: Normocephalic and atraumatic.  Eyes:     General: No scleral icterus.    Conjunctiva/sclera: Conjunctivae normal.  Cardiovascular:     Rate and Rhythm: Normal rate and regular rhythm.     Heart sounds: Normal heart sounds.  Pulmonary:     Effort: Pulmonary effort is normal. No respiratory distress.     Breath sounds: Normal breath sounds.  Abdominal:     Palpations: Abdomen is soft.     Tenderness: There is abdominal tenderness in the epigastric area.  Musculoskeletal:     Cervical back: Normal range of motion and neck supple.   Skin:    General: Skin is warm and dry.  Neurological:     Mental Status: He is alert.  Psychiatric:        Behavior: Behavior normal.    ED Results / Procedures / Treatments   Labs (all labs ordered are listed, but only abnormal results are displayed) Labs Reviewed  COMPREHENSIVE METABOLIC PANEL - Abnormal; Notable for the following components:      Result Value   Sodium 128 (*)    Potassium 3.0 (*)    Chloride 90 (*)    Glucose, Bld 207 (*)    BUN 40 (*)    Creatinine, Ser 10.90 (*)    Calcium 8.8 (*)    Total Protein 9.6 (*)    Albumin 3.3 (*)    AST 10 (*)    GFR, Estimated 5 (*)    All other components within normal limits  I-STAT CHEM 8, ED - Abnormal; Notable for the following components:   Sodium 129 (*)    Potassium 3.0 (*)    Chloride 96 (*)    BUN 47 (*)    Creatinine, Ser 11.80 (*)    Glucose, Bld 187 (*)    Calcium, Ion 0.88 (*)    All other components within normal limits  TROPONIN I (HIGH SENSITIVITY) - Abnormal; Notable for the following components:   Troponin I (High Sensitivity) 22 (*)    All other components within normal limits  TROPONIN I (HIGH SENSITIVITY) - Abnormal; Notable for the following components:   Troponin I (High Sensitivity) 20 (*)    All other components within normal limits  CBC  LIPASE, BLOOD  TYPE AND SCREEN    EKG EKG Interpretation  Date/Time:  Monday November 30 2021 09:37:24 EST Ventricular Rate:  92 PR Interval:  212 QRS Duration: 78 QT Interval:  348 QTC Calculation: 430 R Axis:   -33 Text Interpretation: Sinus rhythm with 1st degree A-V block Left axis deviation Confirmed by Ronnald Nian, Adam ([id number]) on 11/30/2021 10:30:56 AM  Radiology No results found.  Procedures Procedures   Medications Ordered in ED Medications  pantoprazole (PROTONIX) injection 40 mg (40 mg Intravenous Given 11/30/21 1222)    ED Course  I have reviewed the triage vital signs and the nursing notes.  Pertinent labs &  imaging results  that were available during my care of the patient were reviewed by me and considered in my medical decision making (see chart for details).    MDM Rules/Calculators/A&P                           Pawel Soules is a 65 y/o male who presents with c/o hematemesis He has no hx of the same, no known liver disease and is not on anticoagulation. He has had n/v/d and family contact with the same. I suspect mallory weiss tear in this well appearing patient with satble vital signs. Ddx includes  and is less likely to be the following based on presentation and clinical data points:Boerhavve's, esophagitis, gastritis, bleeding ulcer, bleeding varices,viral gastroenteritis, gastric volvulus, bowel obstruction, ACS.   I reviewed and interpreted Labs: CBC and lipase WNL CMP with low sodium- chronic and multiple low electrolyte values. Given his ESRD likely due to depletion. Patients troponin is only mildly elevated- again ESRD   EKG- NSR r-92  Patient here with hematemesis- no nausea, no active vomiting- a symptomatic for sxs of hemorrhage such as racing heart or near syncope. Stomach pain greatly improved after IV Protonix- no concern for ACS- patient has upcoming dialysis and I will have his nephrology/ dialysis team adjust his electrolytes. Patient seen in shared visit with attending physician. Who agrees with assessment, work up , treatment, and plan for discharge.  Final Clinical Impression(s) / ED Diagnoses Final diagnoses:  Hematemesis with nausea    Rx / DC Orders ED Discharge Orders          Ordered    pantoprazole (PROTONIX) 20 MG tablet  Daily        11/30/21 1343    ondansetron (ZOFRAN) 4 MG tablet  Every 8 hours PRN        11/30/21 1343             Margarita Mail, PA-C 12/02/21 0904    Lennice Sites, DO 12/07/21 [phone]

## 2021-11-30 NOTE — Discharge Instructions (Signed)
Get help right away if you: Have dizziness that does not go away, or you are light-headed or faint. Start vomiting again, or you have vomit that is bright red or looks like black coffee grounds. Have bloody, black, or tarry stools. Have chest pain. Cannot eat or drink. These symptoms may be an emergency. Get help right away. Call 911. Do not wait to see if the symptoms will go away. Do not drive yourself to the hospital.

## 2021-12-01 DIAGNOSIS — N186 End stage renal disease: Secondary | ICD-10-CM | POA: Diagnosis not present

## 2021-12-01 DIAGNOSIS — Z992 Dependence on renal dialysis: Secondary | ICD-10-CM | POA: Diagnosis not present

## 2021-12-01 DIAGNOSIS — N2581 Secondary hyperparathyroidism of renal origin: Secondary | ICD-10-CM | POA: Diagnosis not present

## 2021-12-03 DIAGNOSIS — Z992 Dependence on renal dialysis: Secondary | ICD-10-CM | POA: Diagnosis not present

## 2021-12-03 DIAGNOSIS — N186 End stage renal disease: Secondary | ICD-10-CM | POA: Diagnosis not present

## 2021-12-03 DIAGNOSIS — N2581 Secondary hyperparathyroidism of renal origin: Secondary | ICD-10-CM | POA: Diagnosis not present

## 2021-12-05 DIAGNOSIS — N186 End stage renal disease: Secondary | ICD-10-CM | POA: Diagnosis not present

## 2021-12-05 DIAGNOSIS — Z992 Dependence on renal dialysis: Secondary | ICD-10-CM | POA: Diagnosis not present

## 2021-12-05 DIAGNOSIS — N041 Nephrotic syndrome with focal and segmental glomerular lesions: Secondary | ICD-10-CM | POA: Diagnosis not present

## 2021-12-05 DIAGNOSIS — N2581 Secondary hyperparathyroidism of renal origin: Secondary | ICD-10-CM | POA: Diagnosis not present

## 2021-12-08 DIAGNOSIS — Z992 Dependence on renal dialysis: Secondary | ICD-10-CM | POA: Diagnosis not present

## 2021-12-08 DIAGNOSIS — N186 End stage renal disease: Secondary | ICD-10-CM | POA: Diagnosis not present

## 2021-12-08 DIAGNOSIS — N2581 Secondary hyperparathyroidism of renal origin: Secondary | ICD-10-CM | POA: Diagnosis not present

## 2021-12-10 DIAGNOSIS — Z992 Dependence on renal dialysis: Secondary | ICD-10-CM | POA: Diagnosis not present

## 2021-12-10 DIAGNOSIS — N186 End stage renal disease: Secondary | ICD-10-CM | POA: Diagnosis not present

## 2021-12-10 DIAGNOSIS — N2581 Secondary hyperparathyroidism of renal origin: Secondary | ICD-10-CM | POA: Diagnosis not present

## 2021-12-15 DIAGNOSIS — N186 End stage renal disease: Secondary | ICD-10-CM | POA: Diagnosis not present

## 2021-12-15 DIAGNOSIS — Z992 Dependence on renal dialysis: Secondary | ICD-10-CM | POA: Diagnosis not present

## 2021-12-15 DIAGNOSIS — N2581 Secondary hyperparathyroidism of renal origin: Secondary | ICD-10-CM | POA: Diagnosis not present

## 2021-12-17 ENCOUNTER — Encounter (INDEPENDENT_AMBULATORY_CARE_PROVIDER_SITE_OTHER): Payer: Self-pay | Admitting: *Deleted

## 2021-12-17 DIAGNOSIS — Z992 Dependence on renal dialysis: Secondary | ICD-10-CM | POA: Diagnosis not present

## 2021-12-17 DIAGNOSIS — N186 End stage renal disease: Secondary | ICD-10-CM | POA: Diagnosis not present

## 2021-12-17 DIAGNOSIS — N2581 Secondary hyperparathyroidism of renal origin: Secondary | ICD-10-CM | POA: Diagnosis not present

## 2021-12-19 DIAGNOSIS — N186 End stage renal disease: Secondary | ICD-10-CM | POA: Diagnosis not present

## 2021-12-19 DIAGNOSIS — N2581 Secondary hyperparathyroidism of renal origin: Secondary | ICD-10-CM | POA: Diagnosis not present

## 2021-12-19 DIAGNOSIS — Z992 Dependence on renal dialysis: Secondary | ICD-10-CM | POA: Diagnosis not present

## 2021-12-22 DIAGNOSIS — N186 End stage renal disease: Secondary | ICD-10-CM | POA: Diagnosis not present

## 2021-12-22 DIAGNOSIS — N2581 Secondary hyperparathyroidism of renal origin: Secondary | ICD-10-CM | POA: Diagnosis not present

## 2021-12-22 DIAGNOSIS — Z992 Dependence on renal dialysis: Secondary | ICD-10-CM | POA: Diagnosis not present

## 2021-12-24 DIAGNOSIS — N2581 Secondary hyperparathyroidism of renal origin: Secondary | ICD-10-CM | POA: Diagnosis not present

## 2021-12-24 DIAGNOSIS — N186 End stage renal disease: Secondary | ICD-10-CM | POA: Diagnosis not present

## 2021-12-24 DIAGNOSIS — Z992 Dependence on renal dialysis: Secondary | ICD-10-CM | POA: Diagnosis not present

## 2021-12-26 DIAGNOSIS — Z992 Dependence on renal dialysis: Secondary | ICD-10-CM | POA: Diagnosis not present

## 2021-12-26 DIAGNOSIS — N186 End stage renal disease: Secondary | ICD-10-CM | POA: Diagnosis not present

## 2021-12-26 DIAGNOSIS — N2581 Secondary hyperparathyroidism of renal origin: Secondary | ICD-10-CM | POA: Diagnosis not present

## 2021-12-29 DIAGNOSIS — N2581 Secondary hyperparathyroidism of renal origin: Secondary | ICD-10-CM | POA: Diagnosis not present

## 2021-12-29 DIAGNOSIS — Z992 Dependence on renal dialysis: Secondary | ICD-10-CM | POA: Diagnosis not present

## 2021-12-29 DIAGNOSIS — N186 End stage renal disease: Secondary | ICD-10-CM | POA: Diagnosis not present

## 2021-12-31 DIAGNOSIS — N186 End stage renal disease: Secondary | ICD-10-CM | POA: Diagnosis not present

## 2021-12-31 DIAGNOSIS — Z992 Dependence on renal dialysis: Secondary | ICD-10-CM | POA: Diagnosis not present

## 2021-12-31 DIAGNOSIS — N2581 Secondary hyperparathyroidism of renal origin: Secondary | ICD-10-CM | POA: Diagnosis not present

## 2022-01-02 DIAGNOSIS — N186 End stage renal disease: Secondary | ICD-10-CM | POA: Diagnosis not present

## 2022-01-02 DIAGNOSIS — Z992 Dependence on renal dialysis: Secondary | ICD-10-CM | POA: Diagnosis not present

## 2022-01-02 DIAGNOSIS — N2581 Secondary hyperparathyroidism of renal origin: Secondary | ICD-10-CM | POA: Diagnosis not present

## 2022-01-05 DIAGNOSIS — N2581 Secondary hyperparathyroidism of renal origin: Secondary | ICD-10-CM | POA: Diagnosis not present

## 2022-01-05 DIAGNOSIS — N041 Nephrotic syndrome with focal and segmental glomerular lesions: Secondary | ICD-10-CM | POA: Diagnosis not present

## 2022-01-05 DIAGNOSIS — N186 End stage renal disease: Secondary | ICD-10-CM | POA: Diagnosis not present

## 2022-01-05 DIAGNOSIS — Z992 Dependence on renal dialysis: Secondary | ICD-10-CM | POA: Diagnosis not present

## 2022-01-07 DIAGNOSIS — N186 End stage renal disease: Secondary | ICD-10-CM | POA: Diagnosis not present

## 2022-01-07 DIAGNOSIS — N2581 Secondary hyperparathyroidism of renal origin: Secondary | ICD-10-CM | POA: Diagnosis not present

## 2022-01-07 DIAGNOSIS — Z992 Dependence on renal dialysis: Secondary | ICD-10-CM | POA: Diagnosis not present

## 2022-01-09 DIAGNOSIS — N2581 Secondary hyperparathyroidism of renal origin: Secondary | ICD-10-CM | POA: Diagnosis not present

## 2022-01-09 DIAGNOSIS — N186 End stage renal disease: Secondary | ICD-10-CM | POA: Diagnosis not present

## 2022-01-09 DIAGNOSIS — Z992 Dependence on renal dialysis: Secondary | ICD-10-CM | POA: Diagnosis not present

## 2022-01-12 ENCOUNTER — Emergency Department (HOSPITAL_COMMUNITY)
Admit: 2022-01-12 | Discharge: 2022-01-12 | Disposition: A | Discharge status: Home / Self Care | Payer: Medicare HMO | Attending: Emergency Medicine | Admitting: Emergency Medicine

## 2022-01-12 ENCOUNTER — Emergency Department (HOSPITAL_COMMUNITY): Payer: Medicare HMO

## 2022-01-12 ENCOUNTER — Encounter (HOSPITAL_COMMUNITY): Payer: Self-pay

## 2022-01-12 ENCOUNTER — Inpatient Hospital Stay (HOSPITAL_COMMUNITY)
Admission: EM | Admit: 2022-01-12 | Discharge: 2022-01-15 | DRG: 871 | Disposition: A | Discharge status: Home / Self Care | Payer: Medicare HMO | Attending: Internal Medicine | Admitting: Internal Medicine

## 2022-01-12 ENCOUNTER — Other Ambulatory Visit: Payer: Self-pay

## 2022-01-12 DIAGNOSIS — Z20822 Contact with and (suspected) exposure to covid-19: Secondary | ICD-10-CM | POA: Diagnosis not present

## 2022-01-12 DIAGNOSIS — N12 Tubulo-interstitial nephritis, not specified as acute or chronic: Secondary | ICD-10-CM | POA: Diagnosis present

## 2022-01-12 DIAGNOSIS — R509 Fever, unspecified: Secondary | ICD-10-CM | POA: Diagnosis not present

## 2022-01-12 DIAGNOSIS — E440 Moderate protein-calorie malnutrition: Secondary | ICD-10-CM | POA: Diagnosis not present

## 2022-01-12 DIAGNOSIS — Y92239 Unspecified place in hospital as the place of occurrence of the external cause: Secondary | ICD-10-CM | POA: Diagnosis not present

## 2022-01-12 DIAGNOSIS — E1140 Type 2 diabetes mellitus with diabetic neuropathy, unspecified: Secondary | ICD-10-CM | POA: Diagnosis present

## 2022-01-12 DIAGNOSIS — I700 Atherosclerosis of aorta: Secondary | ICD-10-CM | POA: Diagnosis not present

## 2022-01-12 DIAGNOSIS — I120 Hypertensive chronic kidney disease with stage 5 chronic kidney disease or end stage renal disease: Secondary | ICD-10-CM | POA: Diagnosis present

## 2022-01-12 DIAGNOSIS — Z992 Dependence on renal dialysis: Secondary | ICD-10-CM | POA: Diagnosis not present

## 2022-01-12 DIAGNOSIS — Z87891 Personal history of nicotine dependence: Secondary | ICD-10-CM | POA: Diagnosis not present

## 2022-01-12 DIAGNOSIS — G253 Myoclonus: Secondary | ICD-10-CM | POA: Diagnosis not present

## 2022-01-12 DIAGNOSIS — R000 Tachycardia, unspecified: Secondary | ICD-10-CM | POA: Diagnosis not present

## 2022-01-12 DIAGNOSIS — D631 Anemia in chronic kidney disease: Secondary | ICD-10-CM | POA: Diagnosis not present

## 2022-01-12 DIAGNOSIS — R61 Generalized hyperhidrosis: Secondary | ICD-10-CM | POA: Diagnosis not present

## 2022-01-12 DIAGNOSIS — R6510 Systemic inflammatory response syndrome (SIRS) of non-infectious origin without acute organ dysfunction: Secondary | ICD-10-CM

## 2022-01-12 DIAGNOSIS — E871 Hypo-osmolality and hyponatremia: Secondary | ICD-10-CM | POA: Diagnosis not present

## 2022-01-12 DIAGNOSIS — N186 End stage renal disease: Secondary | ICD-10-CM | POA: Diagnosis present

## 2022-01-12 DIAGNOSIS — E876 Hypokalemia: Secondary | ICD-10-CM | POA: Diagnosis present

## 2022-01-12 DIAGNOSIS — Z79899 Other long term (current) drug therapy: Secondary | ICD-10-CM | POA: Diagnosis not present

## 2022-01-12 DIAGNOSIS — M898X9 Other specified disorders of bone, unspecified site: Secondary | ICD-10-CM | POA: Diagnosis present

## 2022-01-12 DIAGNOSIS — R5381 Other malaise: Secondary | ICD-10-CM | POA: Diagnosis not present

## 2022-01-12 DIAGNOSIS — A4151 Sepsis due to Escherichia coli [E. coli]: Secondary | ICD-10-CM | POA: Diagnosis not present

## 2022-01-12 DIAGNOSIS — R109 Unspecified abdominal pain: Secondary | ICD-10-CM | POA: Diagnosis not present

## 2022-01-12 DIAGNOSIS — R6881 Early satiety: Secondary | ICD-10-CM | POA: Diagnosis present

## 2022-01-12 DIAGNOSIS — U071 COVID-19: Secondary | ICD-10-CM | POA: Diagnosis not present

## 2022-01-12 DIAGNOSIS — Z794 Long term (current) use of insulin: Secondary | ICD-10-CM

## 2022-01-12 DIAGNOSIS — Z6832 Body mass index (BMI) 32.0-32.9, adult: Secondary | ICD-10-CM

## 2022-01-12 DIAGNOSIS — R1032 Left lower quadrant pain: Secondary | ICD-10-CM | POA: Diagnosis not present

## 2022-01-12 DIAGNOSIS — R52 Pain, unspecified: Secondary | ICD-10-CM | POA: Diagnosis not present

## 2022-01-12 DIAGNOSIS — E1129 Type 2 diabetes mellitus with other diabetic kidney complication: Secondary | ICD-10-CM | POA: Diagnosis not present

## 2022-01-12 DIAGNOSIS — K6289 Other specified diseases of anus and rectum: Secondary | ICD-10-CM | POA: Diagnosis present

## 2022-01-12 DIAGNOSIS — Z8249 Family history of ischemic heart disease and other diseases of the circulatory system: Secondary | ICD-10-CM | POA: Diagnosis not present

## 2022-01-12 DIAGNOSIS — T426X5A Adverse effect of other antiepileptic and sedative-hypnotic drugs, initial encounter: Secondary | ICD-10-CM | POA: Diagnosis not present

## 2022-01-12 DIAGNOSIS — E119 Type 2 diabetes mellitus without complications: Secondary | ICD-10-CM

## 2022-01-12 DIAGNOSIS — N2581 Secondary hyperparathyroidism of renal origin: Secondary | ICD-10-CM | POA: Diagnosis not present

## 2022-01-12 DIAGNOSIS — R1030 Lower abdominal pain, unspecified: Secondary | ICD-10-CM

## 2022-01-12 DIAGNOSIS — I132 Hypertensive heart and chronic kidney disease with heart failure and with stage 5 chronic kidney disease, or end stage renal disease: Secondary | ICD-10-CM | POA: Diagnosis not present

## 2022-01-12 DIAGNOSIS — E1122 Type 2 diabetes mellitus with diabetic chronic kidney disease: Secondary | ICD-10-CM | POA: Diagnosis not present

## 2022-01-12 DIAGNOSIS — I10 Essential (primary) hypertension: Secondary | ICD-10-CM | POA: Diagnosis not present

## 2022-01-12 DIAGNOSIS — A419 Sepsis, unspecified organism: Secondary | ICD-10-CM | POA: Diagnosis not present

## 2022-01-12 DIAGNOSIS — K8020 Calculus of gallbladder without cholecystitis without obstruction: Secondary | ICD-10-CM | POA: Diagnosis not present

## 2022-01-12 DIAGNOSIS — N250 Renal osteodystrophy: Secondary | ICD-10-CM | POA: Diagnosis not present

## 2022-01-12 DIAGNOSIS — I509 Heart failure, unspecified: Secondary | ICD-10-CM | POA: Diagnosis not present

## 2022-01-12 LAB — COMPREHENSIVE METABOLIC PANEL
ALT: 12 U/L (ref 0–44)
AST: 14 U/L — ABNORMAL LOW (ref 15–41)
Albumin: 2.5 g/dL — ABNORMAL LOW (ref 3.5–5.0)
Alkaline Phosphatase: 62 U/L (ref 38–126)
Anion gap: 13 (ref 5–15)
BUN: 17 mg/dL (ref 8–23)
CO2: 25 mmol/L (ref 22–32)
Calcium: 8.1 mg/dL — ABNORMAL LOW (ref 8.9–10.3)
Chloride: 89 mmol/L — ABNORMAL LOW (ref 98–111)
Creatinine, Ser: 5.08 mg/dL — ABNORMAL HIGH (ref 0.61–1.24)
GFR, Estimated: 12 mL/min — ABNORMAL LOW (ref >60)
Glucose, Bld: 151 mg/dL — ABNORMAL HIGH (ref 70–99)
Potassium: 2.8 mmol/L — ABNORMAL LOW (ref 3.5–5.1)
Sodium: 127 mmol/L — ABNORMAL LOW (ref 135–145)
Total Bilirubin: 2.4 mg/dL — ABNORMAL HIGH (ref 0.3–1.2)
Total Protein: 8.4 g/dL — ABNORMAL HIGH (ref 6.5–8.1)

## 2022-01-12 LAB — CBC WITH DIFFERENTIAL/PLATELET
Abs Immature Granulocytes: 0.12 10*3/uL — ABNORMAL HIGH (ref 0.00–0.07)
Basophils Absolute: 0 10*3/uL (ref 0.0–0.1)
Basophils Relative: 0 %
Eosinophils Absolute: 0 10*3/uL (ref 0.0–0.5)
Eosinophils Relative: 0 %
HCT: 34.5 % — ABNORMAL LOW (ref 39.0–52.0)
Hemoglobin: 11.4 g/dL — ABNORMAL LOW (ref 13.0–17.0)
Immature Granulocytes: 1 %
Lymphocytes Relative: 3 %
Lymphs Abs: 0.4 10*3/uL — ABNORMAL LOW (ref 0.7–4.0)
MCH: 31.3 pg (ref 26.0–34.0)
MCHC: 33 g/dL (ref 30.0–36.0)
MCV: 94.8 fL (ref 80.0–100.0)
Monocytes Absolute: 0.2 10*3/uL (ref 0.1–1.0)
Monocytes Relative: 2 %
Neutro Abs: 11.9 10*3/uL — ABNORMAL HIGH (ref 1.7–7.7)
Neutrophils Relative %: 94 %
Platelets: 237 10*3/uL (ref 150–400)
RBC: 3.64 MIL/uL — ABNORMAL LOW (ref 4.22–5.81)
RDW: 14.2 % (ref 11.5–15.5)
WBC: 12.6 10*3/uL — ABNORMAL HIGH (ref 4.0–10.5)
nRBC: 0 % (ref 0.0–0.2)

## 2022-01-12 LAB — LACTIC ACID, PLASMA
Lactic Acid, Venous: 1.2 mmol/L (ref 0.5–1.9)
Lactic Acid, Venous: 1.3 mmol/L (ref 0.5–1.9)

## 2022-01-12 LAB — RESP PANEL BY RT-PCR (FLU A&B, COVID) ARPGX2
Influenza A by PCR: NEGATIVE
Influenza B by PCR: NEGATIVE
SARS Coronavirus 2 by RT PCR: NEGATIVE

## 2022-01-12 LAB — LIPASE, BLOOD: Lipase: 52 U/L — ABNORMAL HIGH (ref 11–51)

## 2022-01-12 LAB — GLUCOSE, CAPILLARY: Glucose-Capillary: 155 mg/dL — ABNORMAL HIGH (ref 70–99)

## 2022-01-12 MED ORDER — ONDANSETRON HCL 4 MG/2ML IJ SOLN: 4.0000 mg | Freq: Four times a day (QID) | INTRAMUSCULAR | Status: DC | PRN

## 2022-01-12 MED ORDER — ACETAMINOPHEN 325 MG PO TABS
650.0000 mg | ORAL_TABLET | Freq: Once | ORAL | Status: AC
  Administered 2022-01-12: 650 mg via ORAL
  Filled 2022-01-12: qty 2

## 2022-01-12 MED ORDER — PANTOPRAZOLE SODIUM 20 MG PO TBEC
20.0000 mg | DELAYED_RELEASE_TABLET | Freq: Every day | ORAL | Status: DC
  Filled 2022-01-12 (×3): qty 1

## 2022-01-12 MED ORDER — GABAPENTIN 300 MG PO CAPS
300.0000 mg | ORAL_CAPSULE | Freq: Every day | ORAL | Status: DC
  Administered 2022-01-12 – 2022-01-13 (×2): 300 mg via ORAL
  Filled 2022-01-12 (×2): qty 1

## 2022-01-12 MED ORDER — HEPARIN SODIUM (PORCINE) 5000 UNIT/ML IJ SOLN
5000.0000 [IU] | Freq: Three times a day (TID) | INTRAMUSCULAR | Status: DC
  Administered 2022-01-12 – 2022-01-15 (×7): 5000 [IU] via SUBCUTANEOUS
  Filled 2022-01-12 (×8): qty 1

## 2022-01-12 MED ORDER — METRONIDAZOLE 500 MG/100ML IV SOLN
500.0000 mg | Freq: Once | INTRAVENOUS | Status: AC
Start: 2022-01-12 — End: 2022-01-12
  Administered 2022-01-12: 500 mg via INTRAVENOUS
  Filled 2022-01-12: qty 100

## 2022-01-12 MED ORDER — IOHEXOL 300 MG/ML  SOLN
100.0000 mL | Freq: Once | INTRAMUSCULAR | Status: AC | PRN
  Administered 2022-01-12: 100 mL via INTRAVENOUS

## 2022-01-12 MED ORDER — OXYCODONE HCL 5 MG PO TABS: 5.0000 mg | ORAL_TABLET | ORAL | Status: DC | PRN

## 2022-01-12 MED ORDER — SODIUM CHLORIDE 0.9 % IV SOLN
2.0000 g | Freq: Once | INTRAVENOUS | Status: AC
  Administered 2022-01-12: 2 g via INTRAVENOUS
  Filled 2022-01-12: qty 20

## 2022-01-12 MED ORDER — ACETAMINOPHEN 325 MG PO TABS: 650.0000 mg | ORAL_TABLET | Freq: Four times a day (QID) | ORAL | Status: DC | PRN

## 2022-01-12 MED ORDER — CALCIUM ACETATE (PHOS BINDER) 667 MG PO CAPS
1334.0000 mg | ORAL_CAPSULE | Freq: Three times a day (TID) | ORAL | Status: DC
  Administered 2022-01-13 – 2022-01-15 (×6): 1334 mg via ORAL
  Filled 2022-01-12 (×6): qty 2

## 2022-01-12 MED ORDER — INSULIN ASPART 100 UNIT/ML IJ SOLN: 0.0000 [IU] | Freq: Every day | INTRAMUSCULAR | Status: DC

## 2022-01-12 MED ORDER — ACETAMINOPHEN 650 MG RE SUPP: 650.0000 mg | Freq: Four times a day (QID) | RECTAL | Status: DC | PRN

## 2022-01-12 MED ORDER — SODIUM CHLORIDE 0.9 % IV SOLN: 2.0000 g | INTRAVENOUS | Status: DC

## 2022-01-12 MED ORDER — CEFAZOLIN SODIUM-DEXTROSE 2-4 GM/100ML-% IV SOLN: 2.0000 g | INTRAVENOUS | Status: DC

## 2022-01-12 MED ORDER — SODIUM CHLORIDE 0.9 % IV SOLN
2.0000 g | Freq: Once | INTRAVENOUS | Status: AC
  Administered 2022-01-12: 2 g via INTRAVENOUS
  Filled 2022-01-12: qty 2

## 2022-01-12 MED ORDER — ISOSORBIDE MONONITRATE ER 60 MG PO TB24
60.0000 mg | ORAL_TABLET | Freq: Every day | ORAL | Status: DC
  Administered 2022-01-13 – 2022-01-15 (×3): 60 mg via ORAL
  Filled 2022-01-12 (×3): qty 1

## 2022-01-12 MED ORDER — ONDANSETRON HCL 4 MG/2ML IJ SOLN
4.0000 mg | Freq: Once | INTRAMUSCULAR | Status: AC
  Administered 2022-01-12: 4 mg via INTRAVENOUS
  Filled 2022-01-12: qty 2

## 2022-01-12 MED ORDER — INSULIN ASPART 100 UNIT/ML IJ SOLN
0.0000 [IU] | Freq: Three times a day (TID) | INTRAMUSCULAR | Status: DC
  Administered 2022-01-13: 8 [IU] via SUBCUTANEOUS
  Administered 2022-01-13 – 2022-01-14 (×3): 3 [IU] via SUBCUTANEOUS
  Administered 2022-01-14: 2 [IU] via SUBCUTANEOUS
  Administered 2022-01-15: 3 [IU] via SUBCUTANEOUS

## 2022-01-12 MED ORDER — ENSURE ENLIVE PO LIQD
237.0000 mL | Freq: Two times a day (BID) | ORAL | Status: DC
  Administered 2022-01-13 – 2022-01-15 (×2): 237 mL via ORAL

## 2022-01-12 MED ORDER — ONDANSETRON HCL 4 MG PO TABS: 4.0000 mg | ORAL_TABLET | Freq: Four times a day (QID) | ORAL | Status: DC | PRN

## 2022-01-12 NOTE — Assessment & Plan Note (Deleted)
SSI

## 2022-01-12 NOTE — Assessment & Plan Note (Addendum)
Continue to monitor

## 2022-01-12 NOTE — ED Notes (Signed)
Patient returned from CT scan at this time. Alert and states that he feels pretty good.

## 2022-01-12 NOTE — ED Provider Notes (Signed)
[facility] EMERGENCY DEPARTMENT Provider Note   CSN: [id number] Arrival date & time: 01/12/22  1040     History  Chief Complaint  Patient presents with   Abdominal Pain    James [name] is a 66 y.o. male.  Patient sent in from dialysis.  Did complete dialysis.  Patient with complaint of lower quadrant abdominal pain.  Patient normally dialyzed Tuesdays Thursdays and Saturdays.  Patient was dialyzed on Saturday as well.  States has been having dry heaves but no true vomiting.  Patient did arrive with a low-grade temp of 100.3.  Not tachycardic not tachypneic blood pressure is fine.  Patient without any complaint of chest pain or any complaint of shortness of breath.  Patient states he does not make any urine at all.  He has been getting dialysis now for about 3 years.  Past medical history significant for end-stage renal disease.  Hypertension congestive heart failure diabetes chronic kidney disease.       Home Medications Prior to Admission medications   Medication Sig Start Date End Date Taking? Authorizing Provider  calcium acetate (PHOSLO) 667 MG capsule Take 2 capsules (1,334 mg total) by mouth 3 (three) times daily with meals. 04/12/15   Hongalgi, Lenis Dickinson, MD  ceFAZolin (ANCEF) 2-4 GM/100ML-% IVPB Inject 100 mLs (2 g total) into the vein Every Tuesday,Thursday,and Saturday with dialysis. 08/07/16   Nita Sells, MD  Darbepoetin Alfa (ARANESP) 100 MCG/0.5ML SOSY injection Inject 0.5 mLs (100 mcg total) into the vein every Thursday with hemodialysis. Will be given at dialysis. 04/12/15   Hongalgi, Lenis Dickinson, MD  gabapentin (NEURONTIN) 300 MG capsule Take 1 capsule (300 mg total) by mouth at bedtime. 09/05/17   Dettinger, Fransisca Kaufmann, MD  glucose blood (ONETOUCH VERIO) test strip Test three times daily Patient taking differently: 1 each by Other route in the morning, at noon, and at bedtime. 08/27/16   Wardell Honour, MD  insulin aspart (NOVOLOG) 100 UNIT/ML injection Inject 6 Units  into the skin 3 (three) times daily before meals. Patient not taking: Reported on 07/16/2019 07/28/16   Wardell Honour, MD  insulin detemir (LEVEMIR) 100 UNIT/ML injection Inject 0.4 mLs (40 Units total) into the skin at bedtime. Patient not taking: Reported on 07/16/2019 01/10/17   Wardell Honour, MD  isosorbide mononitrate (IMDUR) 60 MG 24 hr tablet Take 1 tablet (60 mg total) by mouth daily. 04/12/15   Hongalgi, Lenis Dickinson, MD  ondansetron (ZOFRAN) 4 MG tablet Take 1 tablet (4 mg total) by mouth every 8 (eight) hours as needed for nausea or vomiting. 11/30/21   Harris, Vernie Shanks, PA-C  pantoprazole (PROTONIX) 20 MG tablet Take 1 tablet (20 mg total) by mouth daily. 11/30/21   Margarita Mail, PA-C  Vitamin D, Ergocalciferol, (DRISDOL) 50000 units CAPS capsule Take 50,000 Units by mouth 3 (three) times a week. On dialysis days    [provider]      Allergies    Patient has no known allergies.    Review of Systems   Review of Systems  Constitutional:  Negative for chills and fever.  HENT:  Negative for ear pain and sore throat.   Eyes:  Negative for pain and visual disturbance.  Respiratory:  Negative for cough and shortness of breath.   Cardiovascular:  Negative for chest pain and palpitations.  Gastrointestinal:  Negative for abdominal pain and vomiting.  Genitourinary:  Negative for dysuria and hematuria.  Musculoskeletal:  Negative for arthralgias and back pain.  Skin:  Negative for color change and rash.  Neurological:  Negative for seizures and syncope.  All other systems reviewed and are negative.  Physical Exam Updated Vital Signs BP (!) 154/130    Pulse (!) 108    Temp 100.3 F (37.9 C) (Oral)    Resp 12    Ht 1.651 m (5\' 5" )    Wt 68 kg    SpO2 94%    BMI 24.96 kg/m  Physical Exam Vitals and nursing note reviewed.  Constitutional:      General: He is not in acute distress.    Appearance: Normal appearance. He is well-developed.  HENT:     Head: Normocephalic and  atraumatic.  Eyes:     Extraocular Movements: Extraocular movements intact.     Conjunctiva/sclera: Conjunctivae normal.     Pupils: Pupils are equal, round, and reactive to light.  Cardiovascular:     Rate and Rhythm: Normal rate and regular rhythm.     Heart sounds: No murmur heard. Pulmonary:     Effort: Pulmonary effort is normal. No respiratory distress.     Breath sounds: Normal breath sounds. No wheezing, rhonchi or rales.  Abdominal:     Palpations: Abdomen is soft.     Tenderness: There is abdominal tenderness. There is no guarding.     Comments: Is to palpation both lower quadrants.  Musculoskeletal:        General: Swelling present.     Cervical back: Neck supple.  Skin:    General: Skin is warm and dry.     Capillary Refill: Capillary refill takes less than 2 seconds.  Neurological:     General: No focal deficit present.     Mental Status: He is alert and oriented to person, place, and time.     Cranial Nerves: No cranial nerve deficit.     Sensory: No sensory deficit.     Motor: No weakness.  Psychiatric:        Mood and Affect: Mood normal.    ED Results / Procedures / Treatments   Labs (all labs ordered are listed, but only abnormal results are displayed) Labs Reviewed  COMPREHENSIVE METABOLIC PANEL - Abnormal; Notable for the following components:      Result Value   Sodium 127 (*)    Potassium 2.8 (*)    Chloride 89 (*)    Glucose, Bld 151 (*)    Creatinine, Ser 5.08 (*)    Calcium 8.1 (*)    Total Protein 8.4 (*)    Albumin 2.5 (*)    AST 14 (*)    Total Bilirubin 2.4 (*)    GFR, Estimated 12 (*)    All other components within normal limits  LIPASE, BLOOD - Abnormal; Notable for the following components:   Lipase 52 (*)    All other components within normal limits  CBC WITH DIFFERENTIAL/PLATELET - Abnormal; Notable for the following components:   WBC 12.6 (*)    RBC 3.64 (*)    Hemoglobin 11.4 (*)    HCT 34.5 (*)    Neutro Abs 11.9 (*)     Lymphs Abs 0.4 (*)    Abs Immature Granulocytes 0.12 (*)    All other components within normal limits  RESP PANEL BY RT-PCR (FLU A&B, COVID) [id number]  CULTURE, BLOOD (ROUTINE X 2)  CULTURE, BLOOD (ROUTINE X 2)    EKG EKG Interpretation  Date/Time:  Tuesday January 12 2022 12:08:39 EST Ventricular Rate:  104 PR Interval:  195 QRS  Duration: 89 QT Interval:  362 QTC Calculation: 477 R Axis:   -8 Text Interpretation: Sinus tachycardia Anteroseptal infarct, old No significant change since last tracing Confirmed by Fredia Sorrow [phone]) on 01/12/2022 12:58:54 PM  Radiology No results found.  Procedures Procedures    Medications Ordered in ED Medications  ondansetron (ZOFRAN) injection 4 mg (4 mg Intravenous Given 01/12/22 1220)    ED Course/ Medical Decision Making/ A&P                           Medical Decision Making Amount and/or Complexity of Data Reviewed Labs: ordered.  Risk Prescription drug management.   Patient with complaint abdominal pain low-grade fever.  Comprehensive metabolic panel looks as if he was dialyzed today potassium is 2.8.  Sodium 127.  Lipase slightly elevated at 52 anion gap normal at 13.  Has patient has a leukocytosis with a white count of 12.6.  Patient had blood cultures done x2 because of the fever.  COVID influenza testing was negative.  Patient will need to be [facility] to [facility] for [facility] scan of the abdomen.  Patient will get a portable chest x-ray here.  With the concerns for the fever patient may very well require admission.  Or may require 1 dose broad-spectrum antibiotic then close follow-up.   Final Clinical Impression(s) / ED Diagnoses Final diagnoses:  ESRD (end stage renal disease) on dialysis ([place])  Fever, unspecified fever cause  Lower abdominal pain    Rx / DC Orders ED Discharge Orders     None         Fredia Sorrow, MD 01/12/22 1539

## 2022-01-12 NOTE — ED Triage Notes (Signed)
Patient from dialysis this am with complaints of LLQ pain since Saturday with nausea. Reports no change in bowel habit. Currently Covid test pending due to cough and fever.

## 2022-01-12 NOTE — Assessment & Plan Note (Addendum)
Consulted nephrology [facility]

## 2022-01-12 NOTE — H&P (Signed)
History and Physical    Patient: James [name] WGN:[id number] DOB: [birth year] DOA: 01/12/2022 DOS: the patient was seen and examined on 01/12/2022 PCP: Pcp, No  Patient coming from: Home  Chief Complaint:  Chief Complaint  Patient presents with   Abdominal Pain    HPI: James [name] is a 66 y.o. male with medical history significant for asthma, CHF, ESRD with hemodialysis Tuesday Thursday Saturday, hypertension, diabetes mellitus type 2 presents the ED with a chief complaint of left flank pain.  Patient is very irritable during my exam.  He is profane.  He repeats over that he does not want to answer questions, but begrudgingly answers some.  Then he took a phone call in the middle of the exam when I asked him if we could finish the exam a few minutes into the phone call, he asked me to leave.  History is cut short for this reason.  Patient reports that he has left flank pain that started 3 to 4 days ago.  Its been constant and not progressively worsening.  He has associated nausea but no vomiting.  He reports he has nothing to vomit because he has not eaten so long.  He reports his last normal meal was weeks ago.  Since then he has just been eating crackers and drinks.  He feels hungry, but then has early satiety and cannot finish his food.  Patient is on dialysis Tuesday Thursday Saturday, and makes next to no urine.  He reports sometimes he has the urge to void and a few drops will come out.  He denies any fevers, but then reports that he had a fever and dialysis and that is why they were making him take a COVID test.  He denies chills.  No further history could be obtained at this time.  Review of Systems: Unable to review all systems due to lack of cooperation from patient. Past Medical History:  Diagnosis Date   Asthma    as a child   CHF (congestive heart failure) (HCC)    Chronic kidney disease    TTHS   Diabetes mellitus without complication (HCC)    Type II   ESRD (end stage renal  disease) (HCC)    Hypertension    Past Surgical History:  Procedure Laterality Date   AV FISTULA PLACEMENT Left 04/08/2015   Procedure: LEFT RADIOCEPHALIC ARTERIOVENOUS (AV) FISTULA CREATION;  Surgeon: Sherren Kerns, MD;  Location: [facility] OR;  Service: Vascular;  Laterality: Left;   AV FISTULA PLACEMENT Right 09/15/2015   Procedure: RADIOCEPHALIC VERSUS BRACHIOCEPHALIC ARTERIOVENOUS (AV) FISTULA CREATION - RIGHT ARM;  Surgeon: Fransisco Hertz, MD;  Location: [facility] OR;  Service: Vascular;  Laterality: Right;   AV FISTULA PLACEMENT Left 06/07/2016   Procedure: LEFT BRACHIOCEPHALIC ARTERIOVENOUS (AV) FISTULA CREATION;  Surgeon: Sherren Kerns, MD;  Location: [facility] OR;  Service: Vascular;  Laterality: Left;   COLONOSCOPY     IR GENERIC HISTORICAL  08/04/2016   IR REMOVAL TUN CV CATH W/O FL 08/04/2016 Brayton El, PA-C [facility]-INTERV RAD   IR GENERIC HISTORICAL  08/06/2016   IR FLUORO GUIDE CV LINE LEFT 08/06/2016 Oley Balm, MD [facility]INTER[facility]   IR GENERIC HISTORICAL  08/06/2016   IR US GUIDE VASC ACCESS LEFT 08/06/2016 Oley Balm, MD [facility]   LIGATION OF ARTERIOVENOUS  FISTULA Left 09/15/2015   Procedure: LIGATION OF RADIOCEPHALIC ARTERIOVENOUS FISTULA - LEFT ARM;  Surgeon: Fransisco Hertz, MD;  Location: [facility] OR;  Service: Vascular;  Laterality: Left;  LIGATION OF ARTERIOVENOUS  FISTULA Right 04/16/2016   Procedure: LIGATION OF ARTERIOVENOUS  FISTULA;  Surgeon: Sherren Kerns, MD;  Location: [facility] OR;  Service: Vascular;  Laterality: Right;   LIGATION OF COMPETING BRANCHES OF ARTERIOVENOUS FISTULA Left 07/16/2015   Procedure: LIGATION OF SIDE BRANCHES OF ARTERIOVENOUS FISTULA LEFT ARM;  Surgeon: Larina Earthly, MD;  Location: [facility] OR;  Service: Vascular;  Laterality: Left;   PERIPHERAL VASCULAR CATHETERIZATION N/A 07/04/2015   Procedure: Fistulagram;  Surgeon: Sherren Kerns, MD;  Location: [facility] INVASIVE CV LAB;  Service: Cardiovascular;  Laterality: N/A;   PERIPHERAL VASCULAR CATHETERIZATION N/A 09/08/2015    Procedure: Fistulagram;  Surgeon: Fransisco Hertz, MD;  Location: [facility] INVASIVE CV LAB;  Service: Cardiovascular;  Laterality: N/A;   PERIPHERAL VASCULAR CATHETERIZATION Right 02/23/2016   Procedure: Fistulagram;  Surgeon: Fransisco Hertz, MD;  Location: [facility] INVASIVE CV LAB;  Service: Cardiovascular;  Laterality: Right;   PERIPHERAL VASCULAR CATHETERIZATION N/A 04/12/2016   Procedure: Fistulagram;  Surgeon: Fransisco Hertz, MD;  Location: [facility] INVASIVE CV LAB;  Service: Cardiovascular;  Laterality: N/A;   REVISON OF ARTERIOVENOUS FISTULA Right 03/10/2016   Procedure: RIGHT CEPHALIC VEIN TURNDOWN;  Surgeon: Fransisco Hertz, MD;  Location: [facility] OR;  Service: Vascular;  Laterality: Right;   TEE WITHOUT CARDIOVERSION N/A 08/06/2016   Procedure: TRANSESOPHAGEAL ECHOCARDIOGRAM (TEE);  Surgeon: Orpah Cobb, MD;  Location: [facility] ENDOSCOPY;  Service: Cardiovascular;  Laterality: N/A;   Social History:  reports that he quit smoking about 16 years ago. His smoking use included cigarettes. He has never used smokeless tobacco. He reports that he does not drink alcohol and does not use drugs.  No Known Allergies  Family History  Problem Relation Age of Onset   Hypertension Other     Prior to Admission medications   Medication Sig Start Date End Date Taking? Authorizing Provider  calcium acetate (PHOSLO) 667 MG capsule Take 2 capsules (1,334 mg total) by mouth 3 (three) times daily with meals. 04/12/15  Yes Hongalgi, Maximino Greenland, MD  ceFAZolin (ANCEF) 2-4 GM/100ML-% IVPB Inject 100 mLs (2 g total) into the vein Every Tuesday,Thursday,and Saturday with dialysis. 08/07/16  Yes Rhetta Mura, MD  Cholecalciferol (VITAMIN D3) 1.25 MG (50000 UT) CAPS Take 1 capsule by mouth once a week. 12/31/21  Yes [provider]  gabapentin (NEURONTIN) 300 MG capsule Take 1 capsule (300 mg total) by mouth at bedtime. 09/05/17  Yes Dettinger, Elige Radon, MD  isosorbide mononitrate (IMDUR) 60 MG 24 hr tablet Take 1 tablet (60 mg total) by mouth  daily. 04/12/15  Yes Hongalgi, Maximino Greenland, MD  Darbepoetin Alfa (ARANESP) 100 MCG/0.5ML SOSY injection Inject 0.5 mLs (100 mcg total) into the vein every Thursday with hemodialysis. Will be given at dialysis. Patient not taking: Reported on 01/12/2022 04/12/15   Elease Etienne, MD  glucose blood (ONETOUCH VERIO) test strip Test three times daily Patient taking differently: 1 each by Other route in the morning, at noon, and at bedtime. 08/27/16   Frederica Kuster, MD  insulin aspart (NOVOLOG) 100 UNIT/ML injection Inject 6 Units into the skin 3 (three) times daily before meals. Patient not taking: Reported on 07/16/2019 07/28/16   Frederica Kuster, MD  insulin detemir (LEVEMIR) 100 UNIT/ML injection Inject 0.4 mLs (40 Units total) into the skin at bedtime. Patient not taking: Reported on 07/16/2019 01/10/17   Frederica Kuster, MD  ondansetron (ZOFRAN) 4 MG tablet Take 1 tablet (4 mg total) by mouth every 8 (eight) hours  as needed for nausea or vomiting. Patient not taking: Reported on 01/12/2022 11/30/21   Arthor Captain, PA-C  pantoprazole (PROTONIX) 20 MG tablet Take 1 tablet (20 mg total) by mouth daily. Patient not taking: Reported on 01/12/2022 11/30/21   Arthor Captain, PA-C    Physical Exam: Vitals:   01/12/22 1852 01/12/22 1855 01/12/22 1916 01/12/22 2132  BP: (!) 137/123 (!) 105/38 140/71 123/74  Pulse: 95 95 93 92  Resp: 18  18 20   Temp:    98.6 F (37 C)  TempSrc:    Oral  SpO2: 100% 98% 97% 100%  Weight:    84.6 kg  Height:    5\' 5"  (1.651 m)   1.  General: Patient lying supine in bed,  no acute distress   2. Psychiatric: Alert and oriented x 3, mood is irritable and behavior is inappropriate, not cooperative with exam  3. Neurologic: Speech and language are normal, face is symmetric, moves all 4 extremities voluntarily, at baseline without acute deficits on limited exam   4. HEENMT:  Head is atraumatic, normocephalic, pupils reactive to light, neck is supple, trachea is  midline, mucous membranes are moist   5. Respiratory : No increased work of breathing, no use of accessory muscles, maintaining oxygen saturations on room air, no cyanosis   6. Cardiovascular : HRRR   7. Gastrointestinal:  Abdomen is soft, nondistended, nontender to palpation bowel sounds active, no masses or organomegaly palpated   8. Skin:  Skin is warm, dry and intact without rashes, acute lesions, or ulcers on limited exam   9.Musculoskeletal:  No acute deformities or trauma  Data Reviewed:   In the ED Temp 99-1 101.4, heart rate 94-119, respiratory rate 10-27, blood pressure 105/38 at admission about 6 hours prior to admission had been 200/56 continue oxygen saturations on room air White blood cell count 12.6, hemoglobin 11.4, platelets 237 Patient was just dialyzed today, his sodium is 127, potassium 2.8, chloride 89, bicarb 25, BUN 17, creatinine 5.08, glucose 151 Albumin shows moderate protein calorie malnutrition 2.5 Lipase 52 CT abdomen pelvis shows cholelithiasis without cholecystitis and free fluid within the paracolic gutter.  It also shows circumferential rectal wall thickening that could be indicative of proctitis.  Admitted left-sided hydronephrosis and proximal left hydroureter with no obstruction.  Superimposed fat stranding could suggest infection. Chest x-ray shows no acute process or explanation of the fever EKG shows a heart rate of 104, sinus tach, QTc 477 Patient was given Rocephin, Flagyl, Tylenol, Zofran  Assessment and Plan: * SIRS (systemic inflammatory response syndrome) (HCC)- (present on admission) Patient had temperature of 101.4, tachycardic at 119, tachypneic at 27, leukocytosis of 12.6 No evidence of endorgan damage Suspicious findings on CT include possible pyelonephritis, and possible proctitis According to chart patient has been on Ancef with dialysis, so broadening antibiotics to cefepime per sepsis order set Procalcitonin pending Sepsis  labs in the a.m. including procalcitonin and cortisol Chest x-ray shows no evidence of infection Unfortunately we will not be able to get a urine culture as patient does not make urine, but blood cultures are pending Avoiding fluid bolus and dialysis patient with stable blood pressure Continue to monitor  Protein-calorie malnutrition, moderate (HCC)- (present on admission) Albumin 2.5 Secondary to poor p.o. intake which is secondary to early satiety Will likely need a work-up for early satiety in the outpatient setting Continue Ensure Enlive between meals while in the hospital  Hyponatremia- (present on admission) See above  Hypokalemia- (present on admission) Deferring  electrolyte management to nephrology team  ESRD on dialysis [facility]) Consult nephrology Defer electrolyte management to nephrology team, with hypokalemia and hyponatremia Continue to monitor  DM (diabetes mellitus) (HCC) Sliding scale coverage Carb modified diet Monitor CBGs       Advance Care Planning:   Code Status: Full Code full  Consults: Nephrology  Family Communication: No family at bedside  Severity of Illness: The appropriate patient status for this patient is INPATIENT. Inpatient status is judged to be reasonable and necessary in order to provide the required intensity of service to ensure the patient's safety. The patient's presenting symptoms, physical exam findings, and initial radiographic and laboratory data in the context of their chronic comorbidities is felt to place them at high risk for further clinical deterioration. Furthermore, it is not anticipated that the patient will be medically stable for discharge from the hospital within 2 midnights of admission.   * I certify that at the point of admission it is my clinical judgment that the patient will require inpatient hospital care spanning beyond 2 midnights from the point of admission due to high intensity of service, high risk for further  deterioration and high frequency of surveillance required.*  Author: Lilyan Gilford, DO 01/12/2022 11:36 PM  For on call review [email or web address].

## 2022-01-12 NOTE — Assessment & Plan Note (Addendum)
Sepsis present on admission with temperature of 101.4, tachycardic at 119, tachypneic at 27, leukocytosis of 12.6. No evidence of endorgan damage Suspicious findings on CT include possible pyelonephritis, and possible proctitis Blood culture returned with E. coli Repeat blood cultures ordered negative to date Cefepime --> Keflex on discharge

## 2022-01-12 NOTE — Assessment & Plan Note (Addendum)
Continue Ensure Enlive between meals while in the hospital

## 2022-01-12 NOTE — ED Provider Notes (Signed)
Care of the patient assumed at the change of shift. Having left sided abdominal pain at Dialysis today. Noted to have a fever here. Awaiting CT at shift change.  Physical Exam  BP (!) 105/38    Pulse 95    Temp 99.9 F (37.7 C) (Oral)    Resp 18    Ht 5\' 5"  (1.651 m)    Wt 68 kg    SpO2 98%    BMI 24.96 kg/m   Physical Exam Awake and alert No distress Procedures  Procedures  ED Course / MDM   Clinical Course as of 01/12/22 2307  Tue Jan 12, 2022  1904 CT shows evidence of proctitis. Will begin Abx given he has a fever and leukocytosis. He otherwise looks well. Hospitalist paged for admission. [CS]  1919 Spoke with Dr. Clearence Ped, Hospitalist, who will evaluate for admission.  [CS]    Clinical Course User Index [CS] Truddie Hidden, MD   Medical Decision Making Amount and/or Complexity of Data Reviewed Labs: ordered. Radiology: ordered.  Risk OTC drugs. Prescription drug management. Decision regarding hospitalization.          Truddie Hidden, MD 01/12/22 [phone]

## 2022-01-12 NOTE — Assessment & Plan Note (Signed)
Deferring electrolyte management to nephrology team

## 2022-01-12 NOTE — ED Notes (Signed)
Patient vomiting, MD made aware. Awaiting orders at this time.

## 2022-01-12 NOTE — ED Notes (Signed)
[facility] to transport patient to [facility] at this time for CT scan.

## 2022-01-13 DIAGNOSIS — A4151 Sepsis due to Escherichia coli [E. coli]: Secondary | ICD-10-CM | POA: Diagnosis not present

## 2022-01-13 LAB — BLOOD CULTURE ID PANEL (REFLEXED) - BCID2

## 2022-01-13 LAB — CBC WITH DIFFERENTIAL/PLATELET
Abs Immature Granulocytes: 0.11 10*3/uL — ABNORMAL HIGH (ref 0.00–0.07)
Basophils Absolute: 0 10*3/uL (ref 0.0–0.1)
Basophils Relative: 0 %
Eosinophils Absolute: 0.1 10*3/uL (ref 0.0–0.5)
Eosinophils Relative: 1 %
HCT: 30.8 % — ABNORMAL LOW (ref 39.0–52.0)
Hemoglobin: 10.2 g/dL — ABNORMAL LOW (ref 13.0–17.0)
Immature Granulocytes: 1 %
Lymphocytes Relative: 6 %
Lymphs Abs: 0.7 10*3/uL (ref 0.7–4.0)
MCH: 31.5 pg (ref 26.0–34.0)
MCHC: 33.1 g/dL (ref 30.0–36.0)
MCV: 95.1 fL (ref 80.0–100.0)
Monocytes Absolute: 0.5 10*3/uL (ref 0.1–1.0)
Monocytes Relative: 4 %
Neutro Abs: 10.6 10*3/uL — ABNORMAL HIGH (ref 1.7–7.7)
Neutrophils Relative %: 88 %
Platelets: 239 10*3/uL (ref 150–400)
RBC: 3.24 MIL/uL — ABNORMAL LOW (ref 4.22–5.81)
RDW: 14.4 % (ref 11.5–15.5)
WBC: 12.1 10*3/uL — ABNORMAL HIGH (ref 4.0–10.5)
nRBC: 0 % (ref 0.0–0.2)

## 2022-01-13 LAB — GLUCOSE, CAPILLARY
Glucose-Capillary: 163 mg/dL — ABNORMAL HIGH (ref 70–99)
Glucose-Capillary: 181 mg/dL — ABNORMAL HIGH (ref 70–99)
Glucose-Capillary: 275 mg/dL — ABNORMAL HIGH (ref 70–99)
Glucose-Capillary: 176 mg/dL — ABNORMAL HIGH (ref 70–99)
Glucose-Capillary: 189 mg/dL — ABNORMAL HIGH (ref 70–99)

## 2022-01-13 LAB — COMPREHENSIVE METABOLIC PANEL
ALT: 13 U/L (ref 0–44)
AST: 19 U/L (ref 15–41)
Albumin: 2.4 g/dL — ABNORMAL LOW (ref 3.5–5.0)
Alkaline Phosphatase: 57 U/L (ref 38–126)
Anion gap: 8 (ref 5–15)
BUN: 28 mg/dL — ABNORMAL HIGH (ref 8–23)
CO2: 28 mmol/L (ref 22–32)
Calcium: 8.2 mg/dL — ABNORMAL LOW (ref 8.9–10.3)
Chloride: 92 mmol/L — ABNORMAL LOW (ref 98–111)
Creatinine, Ser: 6.32 mg/dL — ABNORMAL HIGH (ref 0.61–1.24)
GFR, Estimated: 9 mL/min — ABNORMAL LOW (ref >60)
Glucose, Bld: 193 mg/dL — ABNORMAL HIGH (ref 70–99)
Potassium: 2.8 mmol/L — ABNORMAL LOW (ref 3.5–5.1)
Sodium: 128 mmol/L — ABNORMAL LOW (ref 135–145)
Total Bilirubin: 1.2 mg/dL (ref 0.3–1.2)
Total Protein: 8 g/dL (ref 6.5–8.1)

## 2022-01-13 LAB — PROTIME-INR
INR: 1.4 — ABNORMAL HIGH (ref 0.8–1.2)
Prothrombin Time: 17.4 seconds — ABNORMAL HIGH (ref 11.4–15.2)

## 2022-01-13 LAB — HEMOGLOBIN A1C
Hgb A1c MFr Bld: 7.1 % — ABNORMAL HIGH (ref 4.8–5.6)
Mean Plasma Glucose: 157.07 mg/dL

## 2022-01-13 LAB — CORTISOL-AM, BLOOD: Cortisol - AM: 19 ug/dL (ref 6.7–22.6)

## 2022-01-13 LAB — MAGNESIUM: Magnesium: 1.9 mg/dL (ref 1.7–2.4)

## 2022-01-13 LAB — HIV ANTIBODY (ROUTINE TESTING W REFLEX): HIV Screen 4th Generation wRfx: NONREACTIVE

## 2022-01-13 LAB — PROCALCITONIN: Procalcitonin: 17.48 ng/mL

## 2022-01-13 MED ORDER — SODIUM CHLORIDE 0.9 % IV SOLN
2.0000 g | INTRAVENOUS | Status: DC
  Administered 2022-01-14: 2 g via INTRAVENOUS
  Filled 2022-01-13: qty 2

## 2022-01-13 MED ORDER — PANTOPRAZOLE SODIUM 40 MG PO TBEC
40.0000 mg | DELAYED_RELEASE_TABLET | Freq: Every day | ORAL | Status: DC
  Administered 2022-01-13 – 2022-01-15 (×3): 40 mg via ORAL
  Filled 2022-01-13 (×3): qty 1

## 2022-01-13 MED ORDER — CHLORHEXIDINE GLUCONATE CLOTH 2 % EX PADS
6.0000 | MEDICATED_PAD | Freq: Every day | CUTANEOUS | Status: DC
  Administered 2022-01-13 – 2022-01-15 (×3): 6 via TOPICAL

## 2022-01-13 NOTE — TOC Initial Note (Signed)
Transition of Care [facility]) - Initial/Assessment Note    Patient Details  Name: James [name] MRN: [id number] Date of Birth: 05/18/1956  [facility] of [facility]) CM/SW Contact:    Karn Cassis, LCSW Phone Number: 01/13/2022, 11:12 AM  Clinical Narrative:  Pt admitted due to SIRS. TOC assessment completed due to high risk readmission score. Pt reports he lives alone, but sometimes his brother stays with him. He is independent with ADLs at baseline. Pt reports he uses Medicaid transportation. He is on Tuesday, Thursday, Saturday schedule for dialysis at [facility]. He plans to return home when medically stable. No needs reported at this time. TOC will continue to follow.                   Expected Discharge Plan: Home/Self Care Barriers to Discharge: Continued Medical Work up   Patient Goals and CMS Choice Patient states their goals for this hospitalization and ongoing recovery are:: return home   Choice offered to / list presented to : Patient  Expected Discharge Plan and Services Expected Discharge Plan: Home/Self Care In-house Referral: Clinical Social Work     Living arrangements for the past 2 months: Single Family Home                                      Prior Living Arrangements/Services Living arrangements for the past 2 months: Single Family Home Lives with:: Self Patient language and need for interpreter reviewed:: Yes Do you feel safe going back to the place where you live?: Yes      Need for Family Participation in Patient Care: No (Comment)     Criminal Activity/Legal Involvement Pertinent to Current Situation/Hospitalization: No - Comment as needed  Activities of Daily Living Home Assistive Devices/Equipment: None ADL Screening (condition at time of admission) Patient's cognitive ability adequate to safely complete daily activities?: Yes Is the patient deaf or have difficulty hearing?: No Does the patient have difficulty seeing, even when  wearing glasses/contacts?: No Does the patient have difficulty concentrating, remembering, or making decisions?: No Patient able to express need for assistance with ADLs?: Yes Does the patient have difficulty dressing or bathing?: No Independently performs ADLs?: Yes (appropriate for developmental age) Does the patient have difficulty walking or climbing stairs?: No Weakness of Legs: None Weakness of Arms/Hands: None  Permission Sought/Granted                  Emotional Assessment     Affect (typically observed): Appropriate Orientation: : Oriented to Self, Oriented to Place, Oriented to  Time, Oriented to Situation Alcohol / Substance Use: Not Applicable Psych Involvement: No (comment)  Admission diagnosis:  Proctitis [K62.89] Lower abdominal pain [R10.30] ESRD (end stage renal disease) on dialysis (HCC) [N18.6, Z99.2] SIRS (systemic inflammatory response syndrome) (HCC) [R65.10] Fever, unspecified fever cause [R50.9] Patient Active Problem List   Diagnosis Date Noted   SIRS (systemic inflammatory response syndrome) (HCC) 01/12/2022   Protein-calorie malnutrition, moderate (HCC) 01/12/2022   Type 2 diabetes mellitus with hypertension and end stage renal disease on dialysis (HCC) 09/05/2017   Type 2 diabetes mellitus with diabetic neuropathy, unspecified (HCC) 09/05/2017   Anemia associated with chronic renal failure 08/03/2016   Chronic systolic heart failure (HCC) 08/03/2016   Hyponatremia 08/03/2016   Multiple myeloma (HCC)    Hypokalemia 03/23/2015   HTN (hypertension) 03/22/2015   DM (diabetes mellitus) (HCC) 03/22/2015   ESRD on  dialysis (HCC) 03/22/2015   Anemia 03/22/2015   PCP:  Aviva Kluver Pharmacy:   [facility] [address], [place] - [id number] [place] [address] Phone: [phone] Fax: [phone]     Social Determinants of Health (SDOH) Interventions    Readmission Risk Interventions Readmission Risk Prevention Plan  01/13/2022  Transportation Screening Complete  HRI or Home Care Consult Complete  Social Work Consult for Recovery Care Planning/Counseling Complete  Palliative Care Screening Not Applicable  Medication Review Oceanographer) Complete  Some recent data might be hidden

## 2022-01-13 NOTE — Progress Notes (Signed)
PHARMACY - PHYSICIAN COMMUNICATION CRITICAL VALUE ALERT - BLOOD CULTURE IDENTIFICATION (BCID)  James [name] is an 66 y.o. male who presented to [facility] on 01/12/2022 with a chief complaint of abdominal pain  Assessment:  Patient presented with abdominal pain. CT shows possible pyelonephritis, and possible proctitis.BCID + for E.COLI, no resistance.   Name of physician (or Provider) Contacted: Dr. Dessa Phi  Current antibiotics: Cefepime 2gm IV after HD T,T, Sat  Changes to prescribed antibiotics recommended:  Patient is on recommended antibiotics - No changes needed. F/u sensitivities and clinical progress   Results for orders placed or performed during the hospital encounter of 01/12/22  Blood Culture ID Panel (Reflexed) (Collected: 01/12/2022  1:47 PM)  Result Value Ref Range   Enterococcus faecalis NOT DETECTED NOT DETECTED   Enterococcus Faecium NOT DETECTED NOT DETECTED   Listeria monocytogenes NOT DETECTED NOT DETECTED   Staphylococcus species NOT DETECTED NOT DETECTED   Staphylococcus aureus (BCID) NOT DETECTED NOT DETECTED   Staphylococcus epidermidis NOT DETECTED NOT DETECTED   Staphylococcus lugdunensis NOT DETECTED NOT DETECTED   Streptococcus species NOT DETECTED NOT DETECTED   Streptococcus agalactiae NOT DETECTED NOT DETECTED   Streptococcus pneumoniae NOT DETECTED NOT DETECTED   Streptococcus pyogenes NOT DETECTED NOT DETECTED   A.calcoaceticus-baumannii NOT DETECTED NOT DETECTED   Bacteroides fragilis NOT DETECTED NOT DETECTED   Enterobacterales DETECTED (A) NOT DETECTED   Enterobacter cloacae complex NOT DETECTED NOT DETECTED   Escherichia coli DETECTED (A) NOT DETECTED   Klebsiella aerogenes NOT DETECTED NOT DETECTED   Klebsiella oxytoca NOT DETECTED NOT DETECTED   Klebsiella pneumoniae NOT DETECTED NOT DETECTED   Proteus species NOT DETECTED NOT DETECTED   Salmonella species NOT DETECTED NOT DETECTED   Serratia marcescens NOT DETECTED NOT DETECTED    Haemophilus influenzae NOT DETECTED NOT DETECTED   Neisseria meningitidis NOT DETECTED NOT DETECTED   Pseudomonas aeruginosa NOT DETECTED NOT DETECTED   Stenotrophomonas maltophilia NOT DETECTED NOT DETECTED   Candida albicans NOT DETECTED NOT DETECTED   Candida auris NOT DETECTED NOT DETECTED   Candida glabrata NOT DETECTED NOT DETECTED   Candida krusei NOT DETECTED NOT DETECTED   Candida parapsilosis NOT DETECTED NOT DETECTED   Candida tropicalis NOT DETECTED NOT DETECTED   Cryptococcus neoformans/gattii NOT DETECTED NOT DETECTED   CTX-M ESBL NOT DETECTED NOT DETECTED   Carbapenem resistance IMP NOT DETECTED NOT DETECTED   Carbapenem resistance KPC NOT DETECTED NOT DETECTED   Carbapenem resistance NDM NOT DETECTED NOT DETECTED   Carbapenem resist OXA 48 LIKE NOT DETECTED NOT DETECTED   Carbapenem resistance VIM NOT DETECTED NOT DETECTED   Isac Sarna, BS Vena [place], BCPS Clinical Pharmacist Pager [phone] 01/13/2022  11:18 AM

## 2022-01-13 NOTE — Hospital Course (Addendum)
James [name] is a 66 y.o. male with medical history significant for asthma, CHF, ESRD with hemodialysis Tuesday Thursday Saturday, hypertension, diabetes mellitus type 2 who presents to the ED with chief complaint of left flank pain.  Patient reports that he has left flank pain that started 3 to 4 days ago.  Its been constant and not progressively worsening.  He has associated nausea but no vomiting.  He reports he has nothing to vomit because he has not eaten so long.  He reports his last normal meal was weeks ago.  Since then he has just been eating crackers and drinks.  He denies any fevers, but then reports that he had a fever and dialysis and that is why they were making him take a COVID test.  He denies chills.  In the emergency department, patient was found to be febrile with Tmax 101.4, with elevated heart rate and respiratory rate, elevated WBC 12.6, met sepsis criteria present on admission.  CT abdomen pelvis showed cholelithiasis without cholecystitis, circumferential rectal wall thickening could be indicative of proctitis, left-sided hydronephrosis with proximal left hydroureter with no obstruction, superimposed fat stranding could suggest infection.  Patient was started on Rocephin, Flagyl and admitted to the hospital ° °Blood cultures revealed E. coli.  Urine culture not obtained as patient is anuric. Patient's symptoms continued to improve. Sensitivity of blood culture resulted and patient was discharged home on keflex.  °

## 2022-01-13 NOTE — Assessment & Plan Note (Addendum)
Sliding scale insulin while in hospital. Ha1c 7.1

## 2022-01-13 NOTE — Progress Notes (Signed)
Progress Note   Patient: James [name] WUJ:[id number] DOB: 01-Mar-1956 DOA: 01/12/2022     1 DOS: the patient was seen and examined on 01/13/2022   Brief hospital course: James [name] is a 66 y.o. male with medical history significant for asthma, CHF, ESRD with hemodialysis Tuesday Thursday Saturday, hypertension, diabetes mellitus type 2 who presents to the ED with chief complaint of left flank pain.  Patient reports that he has left flank pain that started 3 to 4 days ago.  Its been constant and not progressively worsening.  He has associated nausea but no vomiting.  He reports he has nothing to vomit because he has not eaten so long.  He reports his last normal meal was weeks ago.  Since then he has just been eating crackers and drinks.  He denies any fevers, but then reports that he had a fever and dialysis and that is why they were making him take a COVID test.  He denies chills.  In the emergency department, patient was found to be febrile with Tmax 101.4, with elevated heart rate and respiratory rate, elevated WBC 12.6, met sepsis criteria present on admission.  CT abdomen pelvis showed cholelithiasis without cholecystitis, circumferential rectal wall thickening could be indicative of proctitis, left-sided hydronephrosis with proximal left hydroureter with no obstruction, superimposed fat stranding could suggest infection.  Patient was started on Rocephin, Flagyl and admitted to the hospital  2/8: Admits to chills and nausea.  Denies any rectal pain, admits to left flank pain which has improved.  Blood cultures revealed E. coli.  Urine culture not obtained as patient is anuric  Assessment and Plan: * Sepsis due to Escherichia coli (E. coli) (HCC)- (present on admission) Sepsis present on admission with temperature of 101.4, tachycardic at 119, tachypneic at 27, leukocytosis of 12.6. No evidence of endorgan damage Suspicious findings on CT include possible pyelonephritis, and possible  proctitis Blood culture returned with E. coli Continue cefepime  ESRD on dialysis [facility]) Consult nephrology   Protein-calorie malnutrition, moderate (HCC)- (present on admission) Continue Ensure Enlive between meals while in the hospital  Type 2 diabetes mellitus with diabetic neuropathy, unspecified (HCC)- (present on admission) Sliding scale insulin  Hyponatremia- (present on admission) Continue to monitor  Hypokalemia- (present on admission) Deferring electrolyte management to nephrology team         Physical Exam: Vitals:   01/12/22 1916 01/12/22 2132 01/13/22 0258 01/13/22 0536  BP: 140/71 123/74 (!) 110/50 (!) 104/59  Pulse: 93 92 86 87  Resp: 18 20 18 18   Temp:  98.6 F (37 C) 98.2 F (36.8 C) 98 F (36.7 C)  TempSrc:  Oral Oral Oral  SpO2: 97% 100% 98% 100%  Weight:  84.6 kg  85.3 kg  Height:  5\' 5"  (1.651 m)  5\' 5"  (1.651 m)   Examination: General exam: Appears covered in blankets, shivering Respiratory system: Clear to auscultation. Respiratory effort normal. Cardiovascular system: S1 & S2 heard, RRR. No pedal edema. Gastrointestinal system: Abdomen is nondistended, soft Central nervous system: Alert and oriented. Extremities: Symmetric in appearance bilaterally  Psychiatry: Judgement and insight appear stable. Mood & affect appropriate.    Data Reviewed:  Potassium is low 2.8, creatinine 6.32, procalcitonin 17.48, WBC 12.1 .  Blood cultures showed E. Coli -discussed with pharmacy regarding continuing cefepime.  Family Communication: No family at bedside  Disposition: Status is: Inpatient Remains inpatient appropriate because: Remains on IV antibiotics          Planned Discharge Destination: Home  Author: Noralee Stain, DO 01/13/2022 12:09 PM  For on call review [email or web address].

## 2022-01-14 DIAGNOSIS — A4151 Sepsis due to Escherichia coli [E. coli]: Secondary | ICD-10-CM | POA: Diagnosis not present

## 2022-01-14 LAB — BASIC METABOLIC PANEL
Anion gap: 8 (ref 5–15)
BUN: 33 mg/dL — ABNORMAL HIGH (ref 8–23)
CO2: 27 mmol/L (ref 22–32)
Calcium: 8.1 mg/dL — ABNORMAL LOW (ref 8.9–10.3)
Chloride: 90 mmol/L — ABNORMAL LOW (ref 98–111)
Creatinine, Ser: 8.33 mg/dL — ABNORMAL HIGH (ref 0.61–1.24)
GFR, Estimated: 7 mL/min — ABNORMAL LOW (ref >60)
Glucose, Bld: 132 mg/dL — ABNORMAL HIGH (ref 70–99)
Potassium: 2.7 mmol/L — CL (ref 3.5–5.1)
Sodium: 125 mmol/L — ABNORMAL LOW (ref 135–145)

## 2022-01-14 LAB — CBC
HCT: 27.3 % — ABNORMAL LOW (ref 39.0–52.0)
Hemoglobin: 9 g/dL — ABNORMAL LOW (ref 13.0–17.0)
MCH: 30.7 pg (ref 26.0–34.0)
MCHC: 33 g/dL (ref 30.0–36.0)
MCV: 93.2 fL (ref 80.0–100.0)
Platelets: 235 10*3/uL (ref 150–400)
RBC: 2.93 MIL/uL — ABNORMAL LOW (ref 4.22–5.81)
RDW: 14.6 % (ref 11.5–15.5)
WBC: 8.6 10*3/uL (ref 4.0–10.5)
nRBC: 0 % (ref 0.0–0.2)

## 2022-01-14 LAB — GLUCOSE, CAPILLARY
Glucose-Capillary: 148 mg/dL — ABNORMAL HIGH (ref 70–99)
Glucose-Capillary: 171 mg/dL — ABNORMAL HIGH (ref 70–99)
Glucose-Capillary: 120 mg/dL — ABNORMAL HIGH (ref 70–99)

## 2022-01-14 LAB — HEPATITIS B SURFACE ANTIBODY,QUALITATIVE: Hep B S Ab: REACTIVE — AB

## 2022-01-14 LAB — HEPATITIS B SURFACE ANTIGEN: Hepatitis B Surface Ag: NONREACTIVE

## 2022-01-14 MED ORDER — PENTAFLUOROPROP-TETRAFLUOROETH EX AERO: 1.0000 "application " | INHALATION_SPRAY | CUTANEOUS | Status: DC | PRN

## 2022-01-14 MED ORDER — POLYETHYLENE GLYCOL 3350 17 G PO PACK
17.0000 g | PACK | Freq: Every day | ORAL | Status: DC
  Administered 2022-01-14 – 2022-01-15 (×2): 17 g via ORAL
  Filled 2022-01-14 (×3): qty 1

## 2022-01-14 MED ORDER — PENTAFLUOROPROP-TETRAFLUOROETH EX AERO
INHALATION_SPRAY | CUTANEOUS | Status: AC
  Administered 2022-01-14: 1 via TOPICAL
  Filled 2022-01-14: qty 116

## 2022-01-14 MED ORDER — SODIUM CHLORIDE 0.9 % IV SOLN: 100.0000 mL | INTRAVENOUS | Status: DC | PRN

## 2022-01-14 MED ORDER — HEPARIN SODIUM (PORCINE) 1000 UNIT/ML DIALYSIS
8000.0000 [IU] | Freq: Once | INTRAMUSCULAR | Status: AC
  Administered 2022-01-14: 8000 [IU] via INTRAVENOUS_CENTRAL
  Filled 2022-01-14: qty 8

## 2022-01-14 MED ORDER — LIDOCAINE-PRILOCAINE 2.5-2.5 % EX CREA: 1.0000 "application " | TOPICAL_CREAM | CUTANEOUS | Status: DC | PRN

## 2022-01-14 MED ORDER — LIDOCAINE HCL (PF) 1 % IJ SOLN: 5.0000 mL | INTRAMUSCULAR | Status: DC | PRN

## 2022-01-14 MED ORDER — CHLORHEXIDINE GLUCONATE CLOTH 2 % EX PADS
6.0000 | MEDICATED_PAD | Freq: Every day | CUTANEOUS | Status: DC
  Administered 2022-01-14 – 2022-01-15 (×2): 6 via TOPICAL

## 2022-01-14 MED ORDER — HEPARIN SODIUM (PORCINE) 1000 UNIT/ML DIALYSIS: 1000.0000 [IU] | INTRAMUSCULAR | Status: DC | PRN

## 2022-01-14 NOTE — Consult Note (Signed)
Reason for Consult: To manage dialysis and dialysis related needs  Referring Physician: Dr Maylene Roes  James [name] is an 66 y.o. male.   HPI: Pt is a 13M with a PMH sig for ESRD, DM II, HTN, CHF who is now seen in consultation at the request of Dr Maylene Roes for eval and recs re: ESRD.  PT is a TTS pt and due for HD today.  He presented to [facility] for eval of LLQ pain.  Has been worsening, hasn't been able to eat much.  CT scan noted proctitis (circumferential rectal wall thickening) and L sided hydronephrosis and hydroureter with no clear cause for obstruction.    Blood cultures noted E coli bacteremia, records state he has been receiving ancef at dialysis but this is not correct- no IV abx have been given in HD according to our OP HD records. He is on cefepime right now.  States he still has some L flank pain.   Has been on gabapentin since admission- having some jerking movements.    Dialyzes at [facility]  EDW 84.2 kg. 4 hrs BFR 450 via AVF, DFR 500  F 180 2K/ 2.5 Ca bath Heparin bolus 8000 mg IV Calcitriol 0.25 mcg TIW  Past Medical History:  Diagnosis Date   Asthma    as a child   CHF (congestive heart failure) ([place])    Chronic kidney disease    TTHS   Diabetes mellitus without complication ([place])    Type II   ESRD (end stage renal disease) ([place])    Hypertension     Past Surgical History:  Procedure Laterality Date   AV FISTULA PLACEMENT Left 04/08/2015   Procedure: LEFT RADIOCEPHALIC ARTERIOVENOUS (AV) FISTULA CREATION;  Surgeon: Elam Dutch, MD;  Location: [place];  Service: Vascular;  Laterality: Left;   AV FISTULA PLACEMENT Right 09/15/2015   Procedure: RADIOCEPHALIC VERSUS BRACHIOCEPHALIC ARTERIOVENOUS (AV) FISTULA CREATION - RIGHT ARM;  Surgeon: Conrad Malin, MD;  Location: [place];  Service: Vascular;  Laterality: Right;   AV FISTULA PLACEMENT Left 06/07/2016   Procedure: LEFT BRACHIOCEPHALIC ARTERIOVENOUS (AV) FISTULA CREATION;  Surgeon: Elam Dutch, MD;  Location: [place];   Service: Vascular;  Laterality: Left;   COLONOSCOPY     IR GENERIC HISTORICAL  08/04/2016   IR REMOVAL TUN CV CATH W/O FL 08/04/2016 Ascencion Dike, PA-C [facility]   IR GENERIC HISTORICAL  08/06/2016   IR FLUORO GUIDE CV LINE LEFT 08/06/2016 Arne Cleveland, MD [facility]   IR GENERIC HISTORICAL  08/06/2016   IR US GUIDE VASC ACCESS LEFT 08/06/2016 Arne Cleveland, MD [facility]   LIGATION OF ARTERIOVENOUS  FISTULA Left 09/15/2015   Procedure: LIGATION OF RADIOCEPHALIC ARTERIOVENOUS FISTULA - LEFT ARM;  Surgeon: Conrad Fillmore, MD;  Location: [place];  Service: Vascular;  Laterality: Left;   LIGATION OF ARTERIOVENOUS  FISTULA Right 04/16/2016   Procedure: LIGATION OF ARTERIOVENOUS  FISTULA;  Surgeon: Elam Dutch, MD;  Location: [place];  Service: Vascular;  Laterality: Right;   LIGATION OF COMPETING BRANCHES OF ARTERIOVENOUS FISTULA Left 07/16/2015   Procedure: LIGATION OF SIDE BRANCHES OF ARTERIOVENOUS FISTULA LEFT ARM;  Surgeon: Rosetta Posner, MD;  Location: [place];  Service: Vascular;  Laterality: Left;   PERIPHERAL VASCULAR CATHETERIZATION N/A 07/04/2015   Procedure: Fistulagram;  Surgeon: Elam Dutch, MD;  Location: [place] [facility];  Service: Cardiovascular;  Laterality: N/A;   PERIPHERAL VASCULAR CATHETERIZATION N/A 09/08/2015   Procedure: Fistulagram;  Surgeon: Conrad Chatham,  MD;  Location: [place] [facility];  Service: Cardiovascular;  Laterality: N/A;   PERIPHERAL VASCULAR CATHETERIZATION Right 02/23/2016   Procedure: Fistulagram;  Surgeon: Conrad San Miguel, MD;  Location: [place] [facility];  Service: Cardiovascular;  Laterality: Right;   PERIPHERAL VASCULAR CATHETERIZATION N/A 04/12/2016   Procedure: Fistulagram;  Surgeon: Conrad Spiro, MD;  Location: [place] [facility];  Service: Cardiovascular;  Laterality: N/A;   REVISON OF ARTERIOVENOUS FISTULA Right 03/10/2016   Procedure: RIGHT CEPHALIC VEIN TURNDOWN;  Surgeon: Conrad Utting, MD;  Location: [place];  Service: Vascular;  Laterality: Right;    TEE WITHOUT CARDIOVERSION N/A 08/06/2016   Procedure: TRANSESOPHAGEAL ECHOCARDIOGRAM (TEE);  Surgeon: Dixie Dials, MD;  Location: [facility]OS[facility];  Service: Cardiovascular;  Laterality: N/A;    Family History  Problem Relation Age of Onset   Hypertension Other     Social History:  reports that he quit smoking about 16 years ago. His smoking use included cigarettes. He has never used smokeless tobacco. He reports that he does not drink alcohol and does not use drugs.  Allergies: No Known Allergies  Medications: Scheduled:  calcium acetate  1,334 mg Oral TID WC   Chlorhexidine Gluconate Cloth  6 each Topical Daily   feeding supplement  237 mL Oral BID BM   heparin  5,000 Units Subcutaneous Q8H   insulin aspart  0-15 Units Subcutaneous TID WC   insulin aspart  0-5 Units Subcutaneous QHS   isosorbide mononitrate  60 mg Oral Daily   pantoprazole  40 mg Oral Daily     Results for orders placed or performed during the hospital encounter of 01/12/22 (from the past 48 hour(s))  Resp Panel by RT-PCR (Flu A&B, Covid) Nasopharyngeal Swab     Status: None   Collection Time: 01/12/22 12:05 PM   Specimen: Nasopharyngeal Swab; Nasopharyngeal(NP) swabs in vial transport medium  Result Value Ref Range   SARS Coronavirus 2 by RT PCR NEGATIVE NEGATIVE    Comment: (NOTE) SARS-CoV-2 target nucleic acids are NOT DETECTED.  The SARS-CoV-2 RNA is generally detectable in upper respiratory specimens during the acute phase of infection. The lowest concentration of SARS-CoV-2 viral copies this assay can detect is 138 copies/mL. A negative result does not preclude SARS-Cov-2 infection and should not be used as the sole basis for treatment or other patient management decisions. A negative result may occur with  improper specimen collection/handling, submission of specimen other than nasopharyngeal swab, presence of viral mutation(s) within the areas targeted by this assay, and inadequate number of  viral copies(<138 copies/mL). A negative result must be combined with clinical observations, patient history, and epidemiological information. The expected result is Negative.  Fact Sheet for Patients:  EntrepreneurPulse.com.au  Fact Sheet for Healthcare Providers:  IncredibleEmployment.be  This test is no t yet approved or cleared by the [place] FDA and  has been authorized for detection and/or diagnosis of SARS-CoV-2 by FDA under an Emergency Use Authorization (EUA). This EUA will remain  in effect (meaning this test can be used) for the duration of the COVID-19 declaration under Section 564(b)(1) of the Act, 21 U.S.C.section 360bbb-3(b)(1), unless the authorization is terminated  or revoked sooner.       Influenza A by PCR NEGATIVE NEGATIVE   Influenza B by PCR NEGATIVE NEGATIVE    Comment: (NOTE) The Xpert Xpress SARS-CoV-2/FLU/RSV plus assay is intended as an aid in the diagnosis of influenza from Nasopharyngeal swab specimens and should not be used as a sole basis  for treatment. Nasal washings and aspirates are unacceptable for Xpert Xpress SARS-CoV-2/FLU/RSV testing.  Fact Sheet for Patients: EntrepreneurPulse.com.au  Fact Sheet for Healthcare Providers: IncredibleEmployment.be  This test is not yet approved or cleared by the [place] FDA and has been authorized for detection and/or diagnosis of SARS-CoV-2 by FDA under an Emergency Use Authorization (EUA). This EUA will remain in effect (meaning this test can be used) for the duration of the COVID-19 declaration under Section 564(b)(1) of the Act, 21 U.S.C. section 360bbb-3(b)(1), unless the authorization is terminated or revoked.  Performed at [facility], [address] [place], [place] [address]   Culture, blood (Routine X 2) w Reflex to ID Panel     Status: None (Preliminary result)   Collection Time: 01/12/22 12:21 PM    Specimen: BLOOD RIGHT HAND  Result Value Ref Range   Specimen Description      BLOOD RIGHT HAND Performed at [facility], [address] [place], [place] [address]    Special Requests      BOTTLES DRAWN AEROBIC AND ANAEROBIC Blood Culture adequate volume Performed at [facility], [address], [place], [place] [address]    Culture  Setup Time      GRAM NEGATIVE RODS anerobic and aerobic bottles Gram Stain Report Called to,Read Back By and Verified With: brown,[id number]  by matthews, b 2.8.23 CRITICAL VALUE NOTED.  VALUE IS CONSISTENT WITH PREVIOUSLY REPORTED AND CALLED VALUE.    Culture      GRAM NEGATIVE RODS IDENTIFICATION TO FOLLOW Performed at [facility], [place] [address], [place], [place] [address]    Report Status PENDING   Culture, blood (Routine X 2) w Reflex to ID Panel     Status: Abnormal (Preliminary result)   Collection Time: 01/12/22  1:47 PM   Specimen: BLOOD RIGHT HAND  Result Value Ref Range   Specimen Description      BLOOD RIGHT HAND Performed at [facility], [address] [place], [place] [address]    Special Requests      BOTTLES DRAWN AEROBIC ONLY Blood Culture adequate volume Performed at [facility], [address], [place], [place] [address]    Culture  Setup Time      GRAM NEGATIVE RODS AEROBIC BOTTLE CRITICAL VALUE NOTED.  VALUE IS CONSISTENT WITH PREVIOUSLY REPORTED AND CALLED VALUE. CRITICAL RESULT CALLED TO, READ BACK BY AND VERIFIED WITH: PHARMD S.HALL AT 0630 ON 01/13/2022 BY T.SAAD.    Culture (A)     ESCHERICHIA COLI SUSCEPTIBILITIES TO FOLLOW Performed at [facility], [place] [address], [place], [place] [address]    Report Status PENDING   Blood Culture ID Panel (Reflexed)     Status: Abnormal   Collection Time: 01/12/22  1:47 PM  Result Value Ref Range   Enterococcus faecalis NOT DETECTED NOT DETECTED   Enterococcus Faecium NOT DETECTED NOT DETECTED   Listeria monocytogenes NOT DETECTED NOT DETECTED   Staphylococcus  species NOT DETECTED NOT DETECTED   Staphylococcus aureus (BCID) NOT DETECTED NOT DETECTED   Staphylococcus epidermidis NOT DETECTED NOT DETECTED   Staphylococcus lugdunensis NOT DETECTED NOT DETECTED   Streptococcus species NOT DETECTED NOT DETECTED   Streptococcus agalactiae NOT DETECTED NOT DETECTED   Streptococcus pneumoniae NOT DETECTED NOT DETECTED   Streptococcus pyogenes NOT DETECTED NOT DETECTED   A.calcoaceticus-baumannii NOT DETECTED NOT DETECTED   Bacteroides fragilis NOT DETECTED NOT DETECTED   Enterobacterales DETECTED (A) NOT DETECTED    Comment: Enterobacterales represent a large order of gram  negative bacteria, not a single organism. CRITICAL RESULT CALLED TO, READ BACK BY AND VERIFIED WITH: PHARMD S.HALL AT 7619 ON 01/13/2022 BY T.SAAD.    Enterobacter cloacae complex NOT DETECTED NOT DETECTED   Escherichia coli DETECTED (A) NOT DETECTED    Comment: CRITICAL RESULT CALLED TO, READ BACK BY AND VERIFIED WITH: PHARMD S.HALL AT 5093 ON 01/13/2022 BY T.SAAD.    Klebsiella aerogenes NOT DETECTED NOT DETECTED   Klebsiella oxytoca NOT DETECTED NOT DETECTED   Klebsiella pneumoniae NOT DETECTED NOT DETECTED   Proteus species NOT DETECTED NOT DETECTED   Salmonella species NOT DETECTED NOT DETECTED   Serratia marcescens NOT DETECTED NOT DETECTED   Haemophilus influenzae NOT DETECTED NOT DETECTED   Neisseria meningitidis NOT DETECTED NOT DETECTED   Pseudomonas aeruginosa NOT DETECTED NOT DETECTED   Stenotrophomonas maltophilia NOT DETECTED NOT DETECTED   Candida albicans NOT DETECTED NOT DETECTED   Candida auris NOT DETECTED NOT DETECTED   Candida glabrata NOT DETECTED NOT DETECTED   Candida krusei NOT DETECTED NOT DETECTED   Candida parapsilosis NOT DETECTED NOT DETECTED   Candida tropicalis NOT DETECTED NOT DETECTED   Cryptococcus neoformans/gattii NOT DETECTED NOT DETECTED   CTX-M ESBL NOT DETECTED NOT DETECTED   Carbapenem resistance IMP NOT DETECTED NOT DETECTED    Carbapenem resistance KPC NOT DETECTED NOT DETECTED   Carbapenem resistance NDM NOT DETECTED NOT DETECTED   Carbapenem resist OXA 48 LIKE NOT DETECTED NOT DETECTED   Carbapenem resistance VIM NOT DETECTED NOT DETECTED    Comment: Performed at [facility], [place] [address] [place], [place] [address]  Comprehensive metabolic panel     Status: Abnormal   Collection Time: 01/12/22  2:13 PM  Result Value Ref Range   Sodium 127 (L) 135 - 145 mmol/L   Potassium 2.8 (L) 3.5 - 5.1 mmol/L   Chloride 89 (L) 98 - 111 mmol/L   CO2 25 22 - 32 mmol/L   Glucose, Bld 151 (H) 70 - 99 mg/dL    Comment: Glucose reference range applies only to samples taken after fasting for at least 8 hours.   BUN 17 8 - 23 mg/dL   Creatinine, Ser 5.08 (H) 0.61 - 1.24 mg/dL   Calcium 8.1 (L) 8.9 - 10.3 mg/dL   Total Protein 8.4 (H) 6.5 - 8.1 g/dL   Albumin 2.5 (L) 3.5 - 5.0 g/dL   AST 14 (L) 15 - 41 U/L   ALT 12 0 - 44 U/L   Alkaline Phosphatase 62 38 - 126 U/L   Total Bilirubin 2.4 (H) 0.3 - 1.2 mg/dL   GFR, Estimated 12 (L) >60 mL/min    Comment: (NOTE) Calculated using the CKD-EPI Creatinine Equation (2021)    Anion gap 13 5 - 15    Comment: Performed at [facility], [address], [place], [place] [address]  Lipase, blood     Status: Abnormal   Collection Time: 01/12/22  2:13 PM  Result Value Ref Range   Lipase 52 (H) 11 - 51 U/L    Comment: Performed at [facility], [address] [place], [place] [address]  CBC with Differential/Platelet     Status: Abnormal   Collection Time: 01/12/22  2:13 PM  Result Value Ref Range   WBC 12.6 (H) 4.0 - 10.5 K/uL   RBC 3.64 (L) 4.22 - 5.81 MIL/uL   Hemoglobin 11.4 (L) 13.0 - 17.0 g/dL   HCT 34.5 (L) 39.0 - 52.0 %   MCV 94.8 80.0 - 100.0 fL  MCH 31.3 26.0 - 34.0 pg   MCHC 33.0 30.0 - 36.0 g/dL   RDW 14.2 11.5 - 15.5 %   Platelets 237 150 - 400 K/uL   nRBC 0.0 0.0 - 0.2 %   Neutrophils Relative % 94 %   Neutro Abs 11.9 (H) 1.7 - 7.7 K/uL   Lymphocytes  Relative 3 %   Lymphs Abs 0.4 (L) 0.7 - 4.0 K/uL   Monocytes Relative 2 %   Monocytes Absolute 0.2 0.1 - 1.0 K/uL   Eosinophils Relative 0 %   Eosinophils Absolute 0.0 0.0 - 0.5 K/uL   Basophils Relative 0 %   Basophils Absolute 0.0 0.0 - 0.1 K/uL   Immature Granulocytes 1 %   Abs Immature Granulocytes 0.12 (H) 0.00 - 0.07 K/uL    Comment: Performed at [facility], [address] [place], [place] [address]  Glucose, capillary     Status: Abnormal   Collection Time: 01/12/22  6:00 PM  Result Value Ref Range   Glucose-Capillary 163 (H) 70 - 99 mg/dL    Comment: Performed at [facility], [place] [address] [place], [place] [address]  Lactic acid, plasma     Status: None   Collection Time: 01/12/22  7:36 PM  Result Value Ref Range   Lactic Acid, Venous 1.2 0.5 - 1.9 mmol/L    Comment: Performed at [facility], [address], [place], [place] [address]  Glucose, capillary     Status: Abnormal   Collection Time: 01/12/22  9:39 PM  Result Value Ref Range   Glucose-Capillary 155 (H) 70 - 99 mg/dL    Comment: Glucose reference range applies only to samples taken after fasting for at least 8 hours.  Lactic acid, plasma     Status: None   Collection Time: 01/12/22  9:53 PM  Result Value Ref Range   Lactic Acid, Venous 1.3 0.5 - 1.9 mmol/L    Comment: Performed at [facility], [address] [place], [place] [address]  Hemoglobin A1c     Status: Abnormal   Collection Time: 01/12/22  9:53 PM  Result Value Ref Range   Hgb A1c MFr Bld 7.1 (H) 4.8 - 5.6 %    Comment: (NOTE) Pre diabetes:          5.7%-6.4%  Diabetes:              >6.4%  Glycemic control for   <7.0% adults with diabetes    Mean Plasma Glucose 157.07 mg/dL    Comment: Performed at [facility] [address], [place], [place] [address]  Protime-INR     Status: Abnormal   Collection Time: 01/13/22  6:34 AM  Result Value Ref Range   Prothrombin Time 17.4 (H) 11.4 - 15.2 seconds   INR 1.4 (H) 0.8 - 1.2     Comment: (NOTE) INR goal varies based on device and disease states. Performed at [facility], [address] [place], [place] [address]   Cortisol-am, blood     Status: None   Collection Time: 01/13/22  6:34 AM  Result Value Ref Range   Cortisol - AM 19.0 6.7 - 22.6 ug/dL    Comment: Performed at [facility] [address], [place], [place] [address]  Comprehensive metabolic panel     Status: Abnormal   Collection Time: 01/13/22  6:34 AM  Result Value Ref Range   Sodium 128 (L) 135 - 145 mmol/L   Potassium 2.8 (L) 3.5 - 5.1  mmol/L   Chloride 92 (L) 98 - 111 mmol/L   CO2 28 22 - 32 mmol/L   Glucose, Bld 193 (H) 70 - 99 mg/dL    Comment: Glucose reference range applies only to samples taken after fasting for at least 8 hours.   BUN 28 (H) 8 - 23 mg/dL   Creatinine, Ser 6.32 (H) 0.61 - 1.24 mg/dL   Calcium 8.2 (L) 8.9 - 10.3 mg/dL   Total Protein 8.0 6.5 - 8.1 g/dL   Albumin 2.4 (L) 3.5 - 5.0 g/dL   AST 19 15 - 41 U/L   ALT 13 0 - 44 U/L   Alkaline Phosphatase 57 38 - 126 U/L   Total Bilirubin 1.2 0.3 - 1.2 mg/dL   GFR, Estimated 9 (L) >60 mL/min    Comment: (NOTE) Calculated using the CKD-EPI Creatinine Equation (2021)    Anion gap 8 5 - 15    Comment: Performed at [facility], [address] [place], [place] [address]  Magnesium     Status: None   Collection Time: 01/13/22  6:34 AM  Result Value Ref Range   Magnesium 1.9 1.7 - 2.4 mg/dL    Comment: Performed at [facility], [address], [place], [place] [address]  CBC WITH DIFFERENTIAL     Status: Abnormal   Collection Time: 01/13/22  6:34 AM  Result Value Ref Range   WBC 12.1 (H) 4.0 - 10.5 K/uL   RBC 3.24 (L) 4.22 - 5.81 MIL/uL   Hemoglobin 10.2 (L) 13.0 - 17.0 g/dL   HCT 30.8 (L) 39.0 - 52.0 %   MCV 95.1 80.0 - 100.0 fL   MCH 31.5 26.0 - 34.0 pg   MCHC 33.1 30.0 - 36.0 g/dL   RDW 14.4 11.5 - 15.5 %   Platelets 239 150 - 400 K/uL   nRBC 0.0 0.0 - 0.2 %   Neutrophils Relative % 88 %   Neutro Abs 10.6  (H) 1.7 - 7.7 K/uL   Lymphocytes Relative 6 %   Lymphs Abs 0.7 0.7 - 4.0 K/uL   Monocytes Relative 4 %   Monocytes Absolute 0.5 0.1 - 1.0 K/uL   Eosinophils Relative 1 %   Eosinophils Absolute 0.1 0.0 - 0.5 K/uL   Basophils Relative 0 %   Basophils Absolute 0.0 0.0 - 0.1 K/uL   Immature Granulocytes 1 %   Abs Immature Granulocytes 0.11 (H) 0.00 - 0.07 K/uL    Comment: Performed at [facility], [address] [place], [place] [address]  Glucose, capillary     Status: Abnormal   Collection Time: 01/13/22  7:46 AM  Result Value Ref Range   Glucose-Capillary 181 (H) 70 - 99 mg/dL    Comment: Glucose reference range applies only to samples taken after fasting for at least 8 hours.  HIV Antibody (routine testing w rflx)     Status: None   Collection Time: 01/13/22  9:04 AM  Result Value Ref Range   HIV Screen 4th Generation wRfx Non Reactive Non Reactive    Comment: Performed at [facility], [address], [place], [place] [address]  Procalcitonin     Status: None   Collection Time: 01/13/22  9:04 AM  Result Value Ref Range   Procalcitonin 17.48 ng/mL    Comment:        Interpretation: PCT >= 10 ng/mL: Important systemic inflammatory response, almost exclusively due to severe bacterial sepsis or septic shock. (NOTE)       Sepsis  PCT Algorithm           Lower Respiratory Tract                                      Infection PCT Algorithm    ----------------------------     ----------------------------         PCT < 0.25 ng/mL                PCT < 0.10 ng/mL          Strongly encourage             Strongly discourage   discontinuation of antibiotics    initiation of antibiotics    ----------------------------     -----------------------------       PCT 0.25 - 0.50 ng/mL            PCT 0.10 - 0.25 ng/mL               OR       >80% decrease in PCT            Discourage initiation of                                            antibiotics      Encourage discontinuation            of antibiotics    ----------------------------     -----------------------------         PCT >= 0.50 ng/mL              PCT 0.26 - 0.50 ng/mL                AND       <80% decrease in PCT             Encourage initiation of                                             antibiotics       Encourage continuation           of antibiotics    ----------------------------     -----------------------------        PCT >= 0.50 ng/mL                  PCT > 0.50 ng/mL               AND         increase in PCT                  Strongly encourage                                      initiation of antibiotics    Strongly encourage escalation           of antibiotics                                     -----------------------------  PCT <= 0.25 ng/mL                                                 OR                                        > 80% decrease in PCT                                      Discontinue / Do not initiate                                             antibiotics  Performed at [facility], [address] [place], [place] [address]   Glucose, capillary     Status: Abnormal   Collection Time: 01/13/22 11:17 AM  Result Value Ref Range   Glucose-Capillary 275 (H) 70 - 99 mg/dL    Comment: Glucose reference range applies only to samples taken after fasting for at least 8 hours.  Glucose, capillary     Status: Abnormal   Collection Time: 01/13/22  4:16 PM  Result Value Ref Range   Glucose-Capillary 176 (H) 70 - 99 mg/dL    Comment: Glucose reference range applies only to samples taken after fasting for at least 8 hours.  Glucose, capillary     Status: Abnormal   Collection Time: 01/13/22  8:36 PM  Result Value Ref Range   Glucose-Capillary 189 (H) 70 - 99 mg/dL    Comment: Glucose reference range applies only to samples taken after fasting for at least 8 hours.  CBC     Status: Abnormal   Collection Time: 01/14/22  4:51 AM  Result  Value Ref Range   WBC 8.6 4.0 - 10.5 K/uL   RBC 2.93 (L) 4.22 - 5.81 MIL/uL   Hemoglobin 9.0 (L) 13.0 - 17.0 g/dL   HCT 27.3 (L) 39.0 - 52.0 %   MCV 93.2 80.0 - 100.0 fL   MCH 30.7 26.0 - 34.0 pg   MCHC 33.0 30.0 - 36.0 g/dL   RDW 14.6 11.5 - 15.5 %   Platelets 235 150 - 400 K/uL   nRBC 0.0 0.0 - 0.2 %    Comment: Performed at [facility], [address], [place], [place] [address]  Basic metabolic panel     Status: Abnormal   Collection Time: 01/14/22  4:51 AM  Result Value Ref Range   Sodium 125 (L) 135 - 145 mmol/L   Potassium 2.7 (LL) 3.5 - 5.1 mmol/L    Comment: CRITICAL RESULT CALLED TO, READ BACK BY AND VERIFIED WITH: SOMERS,C AT 6:30AM ON 01/14/22 BY FESTERMAN,C    Chloride 90 (L) 98 - 111 mmol/L   CO2 27 22 - 32 mmol/L   Glucose, Bld 132 (H) 70 - 99 mg/dL    Comment: Glucose reference range applies only to samples taken after fasting for at least 8 hours.   BUN 33 (H) 8 - 23 mg/dL   Creatinine, Ser 8.33 (H) 0.61 - 1.24 mg/dL   Calcium 8.1 (L) 8.9 -  10.3 mg/dL   GFR, Estimated 7 (L) >60 mL/min    Comment: (NOTE) Calculated using the CKD-EPI Creatinine Equation (2021)    Anion gap 8 5 - 15    Comment: Performed at [facility], [address] [place], [place] [address]  Glucose, capillary     Status: Abnormal   Collection Time: 01/14/22  7:14 AM  Result Value Ref Range   Glucose-Capillary 148 (H) 70 - 99 mg/dL    Comment: Glucose reference range applies only to samples taken after fasting for at least 8 hours.  Culture, blood (routine x 2)     Status: None (Preliminary result)   Collection Time: 01/14/22  7:39 AM   Specimen: BLOOD RIGHT HAND  Result Value Ref Range   Specimen Description BLOOD RIGHT HAND    Special Requests      BOTTLES DRAWN AEROBIC AND ANAEROBIC Blood Culture adequate volume   Culture      NO GROWTH <12 HOURS Performed at [facility], [address] [place], [place] [address]    Report Status PENDING   Culture, blood (routine x 2)      Status: None (Preliminary result)   Collection Time: 01/14/22  7:39 AM   Specimen: BLOOD RIGHT WRIST  Result Value Ref Range   Specimen Description BLOOD RIGHT WRIST    Special Requests      BOTTLES DRAWN AEROBIC AND ANAEROBIC Blood Culture adequate volume   Culture      NO GROWTH <12 HOURS Performed at [facility], [address], [place], [place] [address]    Report Status PENDING     CT Abdomen Pelvis W Contrast  Result Date: 01/12/2022 CLINICAL DATA:  Left lower quadrant abdominal pain since Saturday, history of multiple myeloma EXAM: CT ABDOMEN AND PELVIS WITH CONTRAST TECHNIQUE: Multidetector CT imaging of the abdomen and pelvis was performed using the standard protocol following bolus administration of intravenous contrast. RADIATION DOSE REDUCTION: This exam was performed according to the departmental dose-optimization program which includes automated exposure control, adjustment of the mA and/or kV according to patient size and/or use of iterative reconstruction technique. CONTRAST:  129m OMNIPAQUE IOHEXOL 300 MG/ML  SOLN COMPARISON:  None. FINDINGS: Lower chest: No acute pleural or parenchymal lung disease. Hepatobiliary: Small gallstones layer dependently within the gallbladder without cholecystitis. The liver is unremarkable. No biliary duct dilation. Pancreas: Unremarkable. No pancreatic ductal dilatation or surrounding inflammatory changes. Spleen: Normal in size without focal abnormality. Adrenals/Urinary Tract: Bilateral renal cortical atrophy with numerous bilateral renal cortical cysts consistent with end-stage renal disease. No contrast excretion on delayed images consistent with renal dysfunction. There is mild left-sided hydronephrosis and proximal left hydroureter, without clear cause for obstruction. Mild left Peri ureteral and perinephric fat stranding. Superimposed infection cannot be excluded. The bladder is completely decompressed, limiting its evaluation. The adrenals are  unremarkable. Stomach/Bowel: There is circumferential wall thickening of the rectum, measuring up to 1.3 cm in thickness. Mild perirectal fat stranding. No bowel obstruction or ileus. Normal appendix right lower quadrant. Vascular/Lymphatic: Aortic atherosclerosis. No enlarged abdominal or pelvic lymph nodes. Reproductive: Prostate is unremarkable. Other: Trace free fluid within the left paracolic gutter. No free intraperitoneal gas. No abdominal wall hernia. Musculoskeletal: No acute or destructive bony lesions. Reconstructed images demonstrate no additional findings. IMPRESSION: 1. Circumferential rectal wall thickening and mild perirectal fat stranding, which could reflect proctitis. 2. Mild left-sided hydronephrosis and proximal left hydroureter, with no clear cause for obstruction. Superimposed fat stranding could suggest underlying infection. Correlation with urinalysis  would be useful, if this patient with end-stage renal disease still produces urine. 3. Cholelithiasis without cholecystitis. 4. Trace free fluid left paracolic gutter. 5.  Aortic Atherosclerosis (ICD10-I70.0). Electronically Signed   By: Randa Ngo M.D.   On: 01/12/2022 18:07   DG Chest Port 1 View  Result Date: 01/12/2022 CLINICAL DATA:  Fever.  On dialysis. EXAM: PORTABLE CHEST 1 VIEW COMPARISON:  08/03/2016 FINDINGS: Apical lordotic positioning. Midline trachea. Normal heart size for level of inspiration. No pleural effusion or pneumothorax. Suspect a small hiatal hernia, similar. Clear lungs. IMPRESSION: No acute process or explanation for fever. Electronically Signed   By: Abigail Miyamoto M.D.   On: 01/12/2022 16:07    ROS: all other systems reviewed and are negative except as per HPI Blood pressure 125/64, pulse (!) 106, temperature 98 F (36.7 C), temperature source Oral, resp. rate 18, height 5' 5"  (1.651 m), weight 85.3 kg, SpO2 96 %. GEN: NAD, sitting in chair HEENT muddy sclerae NECK no JVD PULM clear CV soft systolic  murmur ABD + L flank tenderness EXT no LE edema ACCESS: LUE AVF + T/B, looks good  Assessment/Plan: 1 E coli bacteremia: ? Urinary source given hydro on CT.  Send UA and culture if can produce.  On cefepime.  Per primary. 2 ESRD: TTS- K is 2.7, added K bath today with HD on schedule 3 Hypertension: reasonably well controlled 4. Anemia of ESRD:  no ESA as OP 5. Metabolic Bone Disease: calcitriol with HD, binders when eating better 6.  Dispo: inpatient  Madelon Lips 01/14/2022, 10:20 AM

## 2022-01-14 NOTE — Progress Notes (Signed)
RN called due to patient's potassium at 2.7, this has consistently stayed low since November 30, 2021 (3.0) and it was 2.8 on admission (2 days ago).  Patient has dialysis today and this will be deferred to nephrology for correction during dialysis.

## 2022-01-14 NOTE — Progress Notes (Signed)
Progress Note   Patient: James [name] VWU:[id number] DOB: 03/19/1956 DOA: 01/12/2022     2 DOS: the patient was seen and examined on 01/14/2022   Brief hospital course: James [name] is a 66 y.o. male with medical history significant for asthma, CHF, ESRD with hemodialysis Tuesday Thursday Saturday, hypertension, diabetes mellitus type 2 who presents to the ED with chief complaint of left flank pain.  Patient reports that he has left flank pain that started 3 to 4 days ago.  Its been constant and not progressively worsening.  He has associated nausea but no vomiting.  He reports he has nothing to vomit because he has not eaten so long.  He reports his last normal meal was weeks ago.  Since then he has just been eating crackers and drinks.  He denies any fevers, but then reports that he had a fever and dialysis and that is why they were making him take a COVID test.  He denies chills.  In the emergency department, patient was found to be febrile with Tmax 101.4, with elevated heart rate and respiratory rate, elevated WBC 12.6, met sepsis criteria present on admission.  CT abdomen pelvis showed cholelithiasis without cholecystitis, circumferential rectal wall thickening could be indicative of proctitis, left-sided hydronephrosis with proximal left hydroureter with no obstruction, superimposed fat stranding could suggest infection.  Patient was started on Rocephin, Flagyl and admitted to the hospital  2/8: Admits to chills and nausea.  Denies any rectal pain, admits to left flank pain which has improved.  Blood cultures revealed E. coli.  Urine culture not obtained as patient is anuric  2/9: Patient sitting in chair.  States that he had some night sweats.  Afebrile overnight.  Had some lower abdominal pain but no rectal pain, has not had a bowel movement, no further left flank pain.  No nausea or vomiting.  Assessment and Plan: * Sepsis due to Escherichia coli (E. coli) (HCC)- (present on  admission) Sepsis present on admission with temperature of 101.4, tachycardic at 119, tachypneic at 27, leukocytosis of 12.6. No evidence of endorgan damage Suspicious findings on CT include possible pyelonephritis, and possible proctitis Blood culture returned with E. coli, susceptibilities pending Repeat blood cultures ordered today Continue cefepime  ESRD on dialysis [facility]) Consult nephrology   Protein-calorie malnutrition, moderate (HCC)- (present on admission) Continue Ensure Enlive between meals while in the hospital  Type 2 diabetes mellitus with diabetic neuropathy, unspecified (HCC)- (present on admission) Sliding scale insulin  Hyponatremia- (present on admission) Continue to monitor  Hypokalemia- (present on admission) Deferring electrolyte management to nephrology team         Physical Exam: Vitals:   01/13/22 1245 01/13/22 2032 01/14/22 0542 01/14/22 0800  BP: 106/76 (!) 125/41 (!) 103/46 125/64  Pulse: (!) 104 85 96 (!) 106  Resp: 20 19 18    Temp: 98.4 F (36.9 C) 99.1 F (37.3 C) 98 F (36.7 C)   TempSrc:  Oral Oral   SpO2: 100% 100% 96%   Weight:      Height:       Examination: General exam: Appears covered in blankets, shivering Respiratory system: Clear to auscultation. Respiratory effort normal. Cardiovascular system: S1 & S2 heard, RRR. No pedal edema. Gastrointestinal system: Abdomen is nondistended, soft Central nervous system: Alert and oriented. Extremities: Symmetric in appearance bilaterally  Psychiatry: Judgement and insight appear stable. Mood & affect appropriate.    Data Reviewed:  Sodium 125, potassium 2.7, creatinine 8.33.  WBC 8.6.  Blood cultures showing  E. coli, susceptibilities pending  Family Communication: No family at bedside  Disposition: Status is: Inpatient Remains inpatient appropriate because: Remains on IV antibiotics          Planned Discharge Destination: Home      Author: Noralee Stain,  DO 01/14/2022 10:17 AM  For on call review [email or web address].

## 2022-01-14 NOTE — Progress Notes (Signed)
Lab called with critical value potassium 2.7. MD made aware

## 2022-01-15 DIAGNOSIS — A419 Sepsis, unspecified organism: Secondary | ICD-10-CM | POA: Diagnosis not present

## 2022-01-15 DIAGNOSIS — N186 End stage renal disease: Secondary | ICD-10-CM | POA: Diagnosis not present

## 2022-01-15 DIAGNOSIS — Z992 Dependence on renal dialysis: Secondary | ICD-10-CM | POA: Diagnosis not present

## 2022-01-15 LAB — BASIC METABOLIC PANEL
Anion gap: 4 — ABNORMAL LOW (ref 5–15)
BUN: 16 mg/dL (ref 8–23)
CO2: 30 mmol/L (ref 22–32)
Calcium: 7.8 mg/dL — ABNORMAL LOW (ref 8.9–10.3)
Chloride: 95 mmol/L — ABNORMAL LOW (ref 98–111)
Creatinine, Ser: 5.15 mg/dL — ABNORMAL HIGH (ref 0.61–1.24)
GFR, Estimated: 12 mL/min — ABNORMAL LOW (ref >60)
Glucose, Bld: 158 mg/dL — ABNORMAL HIGH (ref 70–99)
Potassium: 3.3 mmol/L — ABNORMAL LOW (ref 3.5–5.1)
Sodium: 129 mmol/L — ABNORMAL LOW (ref 135–145)

## 2022-01-15 LAB — GLUCOSE, CAPILLARY
Glucose-Capillary: 162 mg/dL — ABNORMAL HIGH (ref 70–99)
Glucose-Capillary: 173 mg/dL — ABNORMAL HIGH (ref 70–99)

## 2022-01-15 LAB — CULTURE, BLOOD (ROUTINE X 2): Special Requests: ADEQUATE

## 2022-01-15 LAB — HEPATITIS B SURFACE ANTIBODY, QUANTITATIVE: Hep B S AB Quant (Post): 30.9 m[IU]/mL (ref >9.9)

## 2022-01-15 MED ORDER — CEPHALEXIN 500 MG PO CAPS: 500.0000 mg | ORAL_CAPSULE | ORAL | 0 refills | Status: AC

## 2022-01-15 MED ORDER — CEPHALEXIN 500 MG PO CAPS: 500.0000 mg | ORAL_CAPSULE | ORAL | Status: DC

## 2022-01-15 MED ORDER — DOCUSATE SODIUM 100 MG PO CAPS: 100.0000 mg | ORAL_CAPSULE | Freq: Two times a day (BID) | ORAL | Status: DC

## 2022-01-15 NOTE — Care Management Important Message (Signed)
Important Message  Patient Details  Name: James [name] MRN: [id number] Date of Birth: [birth year]   Medicare Important Message Given:  Yes     Tommy Medal 01/15/2022, 11:44 AM

## 2022-01-15 NOTE — Procedures (Signed)
HEMODIALYSIS TREATMENT NOTE: ° ° °Uneventful 3.5 hour heparinized treatment completed using left upper arm AVF (15g/antegrade). Goal met: 1 liter removed without interruption in UF.  Cefepime given during last 30 minutes of treatment.  All blood was returned and hemostasis was achieved in 15 minutes.  No changes from pre-HD assessment. ° ° ° , RN °

## 2022-01-15 NOTE — Discharge Summary (Addendum)
Physician Discharge Summary   Patient: James [name] MRN: [id number] DOB: [birth year]  Admit date:     01/12/2022  Discharge date: 01/15/22  Discharge Physician: Noralee Stain   PCP: Pcp, No   Recommendations at discharge:    Follow up with PCP   Discharge Diagnoses: Principal Problem:   Sepsis due to Escherichia coli (E. coli) (HCC) Active Problems:   ESRD on dialysis (HCC)   Hypokalemia   Hyponatremia   Type 2 diabetes mellitus with diabetic neuropathy, unspecified (HCC)   Protein-calorie malnutrition, moderate (HCC)  Resolved Problems:   * No resolved hospital problems. *   Hospital Course: James [name] is a 66 y.o. male with medical history significant for asthma, CHF, ESRD with hemodialysis Tuesday Thursday Saturday, hypertension, diabetes mellitus type 2 who presents to the ED with chief complaint of left flank pain.  Patient reports that he has left flank pain that started 3 to 4 days ago.  Its been constant and not progressively worsening.  He has associated nausea but no vomiting.  He reports he has nothing to vomit because he has not eaten so long.  He reports his last normal meal was weeks ago.  Since then he has just been eating crackers and drinks.  He denies any fevers, but then reports that he had a fever and dialysis and that is why they were making him take a COVID test.  He denies chills.  In the emergency department, patient was found to be febrile with Tmax 101.4, with elevated heart rate and respiratory rate, elevated WBC 12.6, met sepsis criteria present on admission.  CT abdomen pelvis showed cholelithiasis without cholecystitis, circumferential rectal wall thickening could be indicative of proctitis, left-sided hydronephrosis with proximal left hydroureter with no obstruction, superimposed fat stranding could suggest infection.  Patient was started on Rocephin, Flagyl and admitted to the hospital  Blood cultures revealed E. coli.  Urine culture not obtained as  patient is anuric. Patient's symptoms continued to improve. Sensitivity of blood culture resulted and patient was discharged home on keflex.   Assessment and Plan: * Sepsis due to Escherichia coli (E. coli) (HCC)- (present on admission) Sepsis present on admission with temperature of 101.4, tachycardic at 119, tachypneic at 27, leukocytosis of 12.6. No evidence of endorgan damage Suspicious findings on CT include possible pyelonephritis, and possible proctitis Blood culture returned with E. coli Repeat blood cultures ordered negative to date Cefepime --> Keflex on discharge   ESRD on dialysis [facility]) Consulted nephrology HD TThSa  Protein-calorie malnutrition, moderate (HCC)- (present on admission) Continue Ensure Enlive between meals while in the hospital  Type 2 diabetes mellitus with diabetic neuropathy, unspecified (HCC)- (present on admission) Sliding scale insulin while in hospital. Ha1c 7.1   Hyponatremia- (present on admission) Continue to monitor  Hypokalemia- (present on admission) Deferring electrolyte management to nephrology team           Consultants: Nephrology Procedures performed: None   Disposition: Home Diet recommendation:  Renal diet  DISCHARGE MEDICATION: Allergies as of 01/15/2022   No Known Allergies      Medication List     STOP taking these medications    ceFAZolin 2-4 GM/100ML-% IVPB Commonly known as: ANCEF   gabapentin 300 MG capsule Commonly known as: NEURONTIN   insulin aspart 100 UNIT/ML injection Commonly known as: novoLOG   insulin detemir 100 UNIT/ML injection Commonly known as: LEVEMIR   ondansetron 4 MG tablet Commonly known as: Zofran       TAKE these medications  calcium acetate 667 MG capsule Commonly known as: PHOSLO Take 2 capsules (1,334 mg total) by mouth 3 (three) times daily with meals.   cephALEXin 500 MG capsule Commonly known as: KEFLEX Take 1 capsule (500 mg total) by mouth daily for 2 days.  On day of dialysis, take medication after dialysis session Start taking on: January 16, 2022   Darbepoetin Alfa 100 MCG/0.5ML Sosy injection Commonly known as: ARANESP Inject 0.5 mLs (100 mcg total) into the vein every Thursday with hemodialysis. Will be given at dialysis.   glucose blood test strip Commonly known as: OneTouch Verio Test three times daily What changed:  how much to take how to take this when to take this additional instructions   isosorbide mononitrate 60 MG 24 hr tablet Commonly known as: IMDUR Take 1 tablet (60 mg total) by mouth daily.   pantoprazole 20 MG tablet Commonly known as: PROTONIX Take 1 tablet (20 mg total) by mouth daily.   Vitamin D3 1.25 MG (50000 UT) Caps Take 1 capsule by mouth once a week.         Discharge Exam: Filed Weights   01/13/22 0536 01/14/22 1600 01/15/22 0454  Weight: 85.3 kg 89.6 kg 87.5 kg   Examination: General exam: Appears calm and comfortable  Respiratory system: Clear to auscultation. Respiratory effort normal. Cardiovascular system: S1 & S2 heard, RRR. No pedal edema. Gastrointestinal system: Abdomen is nondistended, soft and nontender. Normal bowel sounds heard. Central nervous system: Alert and oriented. Non focal exam. Speech clear  Extremities: Symmetric in appearance bilaterally  Skin: No rashes, lesions or ulcers on exposed skin  Psychiatry: Judgement and insight appear stable. Mood & affect appropriate.    Condition at discharge: stable  The results of significant diagnostics from this hospitalization (including imaging, microbiology, ancillary and laboratory) are listed below for reference.   Imaging Studies: CT Abdomen Pelvis W Contrast  Result Date: 01/12/2022 CLINICAL DATA:  Left lower quadrant abdominal pain since Saturday, history of multiple myeloma EXAM: CT ABDOMEN AND PELVIS WITH CONTRAST TECHNIQUE: Multidetector CT imaging of the abdomen and pelvis was performed using the standard protocol  following bolus administration of intravenous contrast. RADIATION DOSE REDUCTION: This exam was performed according to the departmental dose-optimization program which includes automated exposure control, adjustment of the mA and/or kV according to patient size and/or use of iterative reconstruction technique. CONTRAST:  OMNIPAQUE IOHEXOL 300 MG/ML  SOLN COMPARISON:  None. FINDINGS: Lower chest: No acute pleural or parenchymal lung disease. Hepatobiliary: Small gallstones layer dependently within the gallbladder without cholecystitis. The liver is unremarkable. No biliary duct dilation. Pancreas: Unremarkable. No pancreatic ductal dilatation or surrounding inflammatory changes. Spleen: Normal in size without focal abnormality. Adrenals/Urinary Tract: Bilateral renal cortical atrophy with numerous bilateral renal cortical cysts consistent with end-stage renal disease. No contrast excretion on delayed images consistent with renal dysfunction. There is mild left-sided hydronephrosis and proximal left hydroureter, without clear cause for obstruction. Mild left Peri ureteral and perinephric fat stranding. Superimposed infection cannot be excluded. The bladder is completely decompressed, limiting its evaluation. The adrenals are unremarkable. Stomach/Bowel: There is circumferential wall thickening of the rectum, measuring up to 1.3 cm in thickness. Mild perirectal fat stranding. No bowel obstruction or ileus. Normal appendix right lower quadrant. Vascular/Lymphatic: Aortic atherosclerosis. No enlarged abdominal or pelvic lymph nodes. Reproductive: Prostate is unremarkable. James: Trace free fluid within the left paracolic gutter. No free intraperitoneal gas. No abdominal wall hernia. Musculoskeletal: No acute or destructive bony lesions. Reconstructed images demonstrate no additional  findings. IMPRESSION: 1. Circumferential rectal wall thickening and mild perirectal fat stranding, which could reflect proctitis. 2.  Mild left-sided hydronephrosis and proximal left hydroureter, with no clear cause for obstruction. Superimposed fat stranding could suggest underlying infection. Correlation with urinalysis would be useful, if this patient with end-stage renal disease still produces urine. 3. Cholelithiasis without cholecystitis. 4. Trace free fluid left paracolic gutter. 5.  Aortic Atherosclerosis (ICD10-I70.0). Electronically Signed   By: Sharlet Salina M.D.   On: 01/12/2022 18:07   DG Chest Port 1 View  Result Date: 01/12/2022 CLINICAL DATA:  Fever.  On dialysis. EXAM: PORTABLE CHEST 1 VIEW COMPARISON:  08/03/2016 FINDINGS: Apical lordotic positioning. Midline trachea. Normal heart size for level of inspiration. No pleural effusion or pneumothorax. Suspect a small hiatal hernia, similar. Clear lungs. IMPRESSION: No acute process or explanation for fever. Electronically Signed   By: Jeronimo Greaves M.D.   On: 01/12/2022 16:07    Microbiology: Results for orders placed or performed during the hospital encounter of 01/12/22  Resp Panel by RT-PCR (Flu A&B, Covid) Nasopharyngeal Swab     Status: None   Collection Time: 01/12/22 12:05 PM   Specimen: Nasopharyngeal Swab; Nasopharyngeal(NP) swabs in vial transport medium  Result Value Ref Range Status   SARS Coronavirus 2 by RT PCR NEGATIVE NEGATIVE Final    Comment: (NOTE) SARS-CoV-2 target nucleic acids are NOT DETECTED.  The SARS-CoV-2 RNA is generally detectable in upper respiratory specimens during the acute phase of infection. The lowest concentration of SARS-CoV-2 viral copies this assay can detect is 138 copies/mL. A negative result does not preclude SARS-Cov-2 infection and should not be used as the sole basis for treatment or James patient management decisions. A negative result may occur with  improper specimen collection/handling, submission of specimen James than nasopharyngeal swab, presence of viral mutation(s) within the areas targeted by this assay,  and inadequate number of viral copies(<138 copies/mL). A negative result must be combined with clinical observations, patient history, and epidemiological information. The expected result is Negative.  Fact Sheet for Patients:  BloggerCourse.com  Fact Sheet for Healthcare Providers:  SeriousBroker.it  This test is no t yet approved or cleared by the [place] FDA and  has been authorized for detection and/or diagnosis of SARS-CoV-2 by FDA under an Emergency Use Authorization (EUA). This EUA will remain  in effect (meaning this test can be used) for the duration of the COVID-19 declaration under Section 564(b)(1) of the Act, 21 U.S.C.section 360bbb-3(b)(1), unless the authorization is terminated  or revoked sooner.       Influenza A by PCR NEGATIVE NEGATIVE Final   Influenza B by PCR NEGATIVE NEGATIVE Final    Comment: (NOTE) The Xpert Xpress SARS-CoV-2/FLU/RSV plus assay is intended as an aid in the diagnosis of influenza from Nasopharyngeal swab specimens and should not be used as a sole basis for treatment. Nasal washings and aspirates are unacceptable for Xpert Xpress SARS-CoV-2/FLU/RSV testing.  Fact Sheet for Patients: BloggerCourse.com  Fact Sheet for Healthcare Providers: SeriousBroker.it  This test is not yet approved or cleared by the [place] FDA and has been authorized for detection and/or diagnosis of SARS-CoV-2 by FDA under an Emergency Use Authorization (EUA). This EUA will remain in effect (meaning this test can be used) for the duration of the COVID-19 declaration under Section 564(b)(1) of the Act, 21 U.S.C. section 360bbb-3(b)(1), unless the authorization is terminated or revoked.  Performed at [facility], [address] [place], [place] [address]   Culture, blood (  Routine X 2) w Reflex to ID Panel     Status: Abnormal (Preliminary result)    Collection Time: 01/12/22 12:21 PM   Specimen: BLOOD RIGHT HAND  Result Value Ref Range Status   Specimen Description   Final    BLOOD RIGHT HAND Performed at [facility], [address], [place], [place] [address]    Special Requests   Final    BOTTLES DRAWN AEROBIC AND ANAEROBIC Blood Culture adequate volume Performed at [facility], [address] [place], [place] [address]    Culture  Setup Time   Final    GRAM NEGATIVE RODS anerobic and aerobic bottles Gram Stain Report Called to,Read Back By and Verified With: [id number]  by matthews, b 2.8.23 CRITICAL VALUE NOTED.  VALUE IS CONSISTENT WITH PREVIOUSLY REPORTED AND CALLED VALUE.    Culture (A)  Final    ESCHERICHIA COLI SUSCEPTIBILITIES PERFORMED ON PREVIOUS CULTURE WITHIN THE LAST 5 DAYS. Performed at [facility], [address] [place], [place] [address]    Report Status PENDING  Incomplete  Culture, blood (Routine X 2) w Reflex to ID Panel     Status: Abnormal   Collection Time: 01/12/22  1:47 PM   Specimen: BLOOD RIGHT HAND  Result Value Ref Range Status   Specimen Description   Final    BLOOD RIGHT HAND Performed at [facility], [address], [place], [place] [address]    Special Requests   Final    BOTTLES DRAWN AEROBIC ONLY Blood Culture adequate volume Performed at [facility], [address] [place], [place] [address]    Culture  Setup Time   Final    GRAM NEGATIVE RODS AEROBIC BOTTLE CRITICAL VALUE NOTED.  VALUE IS CONSISTENT WITH PREVIOUSLY REPORTED AND CALLED VALUE. CRITICAL RESULT CALLED TO, READ BACK BY AND VERIFIED WITH: PHARMD S.HALL AT 1053 ON 01/13/2022 BY T.SAAD. Performed at [facility], [address] [place], [place] [address]    Culture ESCHERICHIA COLI (A)  Final   Report Status 01/15/2022 FINAL  Final   Organism ID, Bacteria ESCHERICHIA COLI  Final      Susceptibility   Escherichia coli - MIC*    AMPICILLIN >=32 RESISTANT Resistant     CEFAZOLIN <=4 SENSITIVE Sensitive      CEFEPIME <=0.12 SENSITIVE Sensitive     CEFTAZIDIME <=1 SENSITIVE Sensitive     CEFTRIAXONE <=0.25 SENSITIVE Sensitive     CIPROFLOXACIN <=0.25 SENSITIVE Sensitive     GENTAMICIN <=1 SENSITIVE Sensitive     IMIPENEM <=0.25 SENSITIVE Sensitive     TRIMETH/SULFA <=20 SENSITIVE Sensitive     AMPICILLIN/SULBACTAM 16 INTERMEDIATE Intermediate     PIP/TAZO <=4 SENSITIVE Sensitive     * ESCHERICHIA COLI  Blood Culture ID Panel (Reflexed)     Status: Abnormal   Collection Time: 01/12/22  1:47 PM  Result Value Ref Range Status   Enterococcus faecalis NOT DETECTED NOT DETECTED Final   Enterococcus Faecium NOT DETECTED NOT DETECTED Final   Listeria monocytogenes NOT DETECTED NOT DETECTED Final   Staphylococcus species NOT DETECTED NOT DETECTED Final   Staphylococcus aureus (BCID) NOT DETECTED NOT DETECTED Final   Staphylococcus epidermidis NOT DETECTED NOT DETECTED Final   Staphylococcus lugdunensis NOT DETECTED NOT DETECTED Final   Streptococcus species NOT DETECTED NOT DETECTED Final   Streptococcus agalactiae NOT DETECTED NOT DETECTED Final   Streptococcus pneumoniae NOT DETECTED NOT DETECTED Final   Streptococcus pyogenes NOT DETECTED NOT DETECTED Final   A.calcoaceticus-baumannii NOT DETECTED NOT  DETECTED Final   Bacteroides fragilis NOT DETECTED NOT DETECTED Final   Enterobacterales DETECTED (A) NOT DETECTED Final    Comment: Enterobacterales represent a large order of gram negative bacteria, not a single organism. CRITICAL RESULT CALLED TO, READ BACK BY AND VERIFIED WITH: PHARMD S.HALL AT 1053 ON 01/13/2022 BY T.SAAD.    Enterobacter cloacae complex NOT DETECTED NOT DETECTED Final   Escherichia coli DETECTED (A) NOT DETECTED Final    Comment: CRITICAL RESULT CALLED TO, READ BACK BY AND VERIFIED WITH: PHARMD S.HALL AT 1053 ON 01/13/2022 BY T.SAAD.    Klebsiella aerogenes NOT DETECTED NOT DETECTED Final   Klebsiella oxytoca NOT DETECTED NOT DETECTED Final   Klebsiella pneumoniae NOT  DETECTED NOT DETECTED Final   Proteus species NOT DETECTED NOT DETECTED Final   Salmonella species NOT DETECTED NOT DETECTED Final   Serratia marcescens NOT DETECTED NOT DETECTED Final   Haemophilus influenzae NOT DETECTED NOT DETECTED Final   Neisseria meningitidis NOT DETECTED NOT DETECTED Final   Pseudomonas aeruginosa NOT DETECTED NOT DETECTED Final   Stenotrophomonas maltophilia NOT DETECTED NOT DETECTED Final   Candida albicans NOT DETECTED NOT DETECTED Final   Candida auris NOT DETECTED NOT DETECTED Final   Candida glabrata NOT DETECTED NOT DETECTED Final   Candida krusei NOT DETECTED NOT DETECTED Final   Candida parapsilosis NOT DETECTED NOT DETECTED Final   Candida tropicalis NOT DETECTED NOT DETECTED Final   Cryptococcus neoformans/gattii NOT DETECTED NOT DETECTED Final   CTX-M ESBL NOT DETECTED NOT DETECTED Final   Carbapenem resistance IMP NOT DETECTED NOT DETECTED Final   Carbapenem resistance KPC NOT DETECTED NOT DETECTED Final   Carbapenem resistance NDM NOT DETECTED NOT DETECTED Final   Carbapenem resist OXA 48 LIKE NOT DETECTED NOT DETECTED Final   Carbapenem resistance VIM NOT DETECTED NOT DETECTED Final    Comment: Performed at [facility], [address] [place], [place] [address]  Culture, blood (routine x 2)     Status: None (Preliminary result)   Collection Time: 01/14/22  7:39 AM   Specimen: BLOOD RIGHT HAND  Result Value Ref Range Status   Specimen Description BLOOD RIGHT HAND  Final   Special Requests   Final    BOTTLES DRAWN AEROBIC AND ANAEROBIC Blood Culture adequate volume   Culture   Final    NO GROWTH < 24 HOURS Performed at [facility], [address], [place], [place] [address]    Report Status PENDING  Incomplete  Culture, blood (routine x 2)     Status: None (Preliminary result)   Collection Time: 01/14/22  7:39 AM   Specimen: BLOOD RIGHT WRIST  Result Value Ref Range Status   Specimen Description BLOOD RIGHT WRIST  Final   Special  Requests   Final    BOTTLES DRAWN AEROBIC AND ANAEROBIC Blood Culture adequate volume   Culture   Final    NO GROWTH < 24 HOURS Performed at [facility], [address] [place], [place] [address]    Report Status PENDING  Incomplete    Labs: CBC: Recent Labs  Lab 01/12/22 1413 01/13/22 0634 01/14/22 0451  WBC 12.6* 12.1* 8.6  NEUTROABS 11.9* 10.6*  --   HGB 11.4* 10.2* 9.0*  HCT 34.5* 30.8* 27.3*  MCV 94.8 95.1 93.2  PLT 237 239 235   Basic Metabolic Panel: Recent Labs  Lab 01/12/22 1413 01/13/22 0634 01/14/22 0451 01/15/22 0438  NA 127* 128* 125* 129*  K 2.8* 2.8* 2.7* 3.3*  CL 89* 92*  90* 95*  CO2 25 28 27 30   GLUCOSE 151* 193* 132* 158*  BUN 17 28* 33* 16  CREATININE 5.08* 6.32* 8.33* 5.15*  CALCIUM 8.1* 8.2* 8.1* 7.8*  MG  --  1.9  --   --    Liver Function Tests: Recent Labs  Lab 01/12/22 1413 01/13/22 0634  AST 14* 19  ALT 12 13  ALKPHOS 62 57  BILITOT 2.4* 1.2  PROT 8.4* 8.0  ALBUMIN 2.5* 2.4*   CBG: Recent Labs  Lab 01/13/22 2036 01/14/22 0714 01/14/22 1111 01/14/22 2021 01/15/22 0757  GLUCAP 189* 148* 171* 120* 162*    Discharge time spent: less than 30 minutes.  Signed: Noralee Stain, DO [facility] 01/15/2022

## 2022-01-15 NOTE — TOC Transition Note (Signed)
Transition of Care [facility]) - CM/SW Discharge Note   Patient Details  Name: James [name] MRN: [id number] Date of Birth: [birth year]  Transition of Care [facility]) CM/SW Contact:  Shade Flood, LCSW Phone Number: 01/15/2022, 10:21 AM   Clinical Narrative:     TOC following. MD indicating pt will dc home today. Anticipating family transport home. There are no TOC needs identified for dc.  Final next level of care: Home/Self Care Barriers to Discharge: Barriers Resolved   Patient Goals and CMS Choice Patient states their goals for this hospitalization and ongoing recovery are:: return home   Choice offered to / list presented to : Patient  Discharge Placement                       Discharge Plan and Services In-house Referral: Clinical Social Work                                   Social Determinants of Health (SDOH) Interventions     Readmission Risk Interventions Readmission Risk Prevention Plan 01/13/2022  Transportation Screening Complete  HRI or [place] Complete  Social Work Consult for [place] Planning/Counseling Complete  Palliative Care Screening Not Applicable  Medication Review Press photographer) Complete  Some recent data might be hidden

## 2022-01-15 NOTE — Progress Notes (Signed)
°  [facility] Progress Note   Assessment/ Plan:    Dialyzes at [facility] TTS  EDW 84.2 kg. 4 hrs BFR 450 via AVF, DFR 500  F 180 2K/ 2.5 Ca bath Heparin bolus 8000 mg IV Calcitriol 0.25 mcg TIW   1 E coli bacteremia: ? Urinary source given hydro on CT.  Send UA and culture if can produce but pt says he doesn't make any more urine.  Ceftriaxone--> cefepime--> oral Keflex for a 14 day course.  Repeat cultures 2/9 no growth to date.  Other possibility is the proctitis.  Would repeat US after abx course and then possibly refer to urology afterwards.  2 ESRD: TTS- K is 2.7, added K bath yesterday.  HD on schedule tomorrow 2/11 if still here- eating better so dont' think added K bath will be needed as OP 3 Hypertension: reasonably well controlled 4. Anemia of ESRD:  no ESA as OP 5. Metabolic Bone Disease: calcitriol with HD, binders when eating better 6.  Gabapentin toxicity- stopped gabapentin yesterday and had HD--> resolved myoclonic jerking.  Pt states he doesn't take gabapentin daily at home. 7.  Dispo: per primary- OK to d/c from dialysis perspective at any time  Subjective:    Seen and examined in the room.  Feeling better.  Appetite is better.  Dialyzed yesterday and feels better.  No more jerking.     Objective:   BP (!) 160/81 (BP Location: Right Leg)    Pulse 91    Temp 99 F (37.2 C)    Resp 19    Ht 5\' 5"  (1.651 m)    Wt 87.5 kg    SpO2 100%    BMI 32.10 kg/m   Physical Exam: NAD, sitting in chair HEENT muddy sclerae NECK no JVD PULM clear CV soft systolic murmur ABD + L flank tenderness, much improved EXT no LE edema ACCESS: LUE AVF + T/B, looks good NEURO: no more myoclonic jerking Labs: BMET Recent Labs  Lab 01/12/22 1413 01/13/22 0634 01/14/22 0451 01/15/22 0438  NA 127* 128* 125* 129*  K 2.8* 2.8* 2.7* 3.3*  CL 89* 92* 90* 95*  CO2 25 28 27 30   GLUCOSE 151* 193* 132* 158*  BUN 17 28* 33* 16  CREATININE 5.08* 6.32* 8.33* 5.15*  CALCIUM 8.1*  8.2* 8.1* 7.8*   CBC Recent Labs  Lab 01/12/22 1413 01/13/22 0634 01/14/22 0451  WBC 12.6* 12.1* 8.6  NEUTROABS 11.9* 10.6*  --   HGB 11.4* 10.2* 9.0*  HCT 34.5* 30.8* 27.3*  MCV 94.8 95.1 93.2  PLT 237 239 235      Medications:     calcium acetate  1,334 mg Oral TID WC   [START ON 01/16/2022] cephALEXin  500 mg Oral Q24H   Chlorhexidine Gluconate Cloth  6 each Topical Daily   Chlorhexidine Gluconate Cloth  6 each Topical Q0600   feeding supplement  237 mL Oral BID BM   heparin  5,000 Units Subcutaneous Q8H   insulin aspart  0-15 Units Subcutaneous TID WC   insulin aspart  0-5 Units Subcutaneous QHS   isosorbide mononitrate  60 mg Oral Daily   pantoprazole  40 mg Oral Daily   polyethylene glycol  17 g Oral Daily     Madelon Lips MD 01/15/2022, 10:25 AM

## 2022-01-15 NOTE — Progress Notes (Signed)
Pharmacy Antibiotic Note  James [name] is a 66 y.o. male admitted on 01/12/2022 with  E.coli bacteremia .  Pharmacy has been consulted for cephalexin dosing.  Patient is HD T-Th-Sat  Plan: Cephalexin 500 mg PO daily. On days he has HD, make sure dose is given after HD.   Height: 5\' 5"  (165.1 cm) Weight: 87.5 kg (192 lb 14.4 oz) IBW/kg (Calculated) : 61.5  Temp (24hrs), Avg:99 F (37.2 C), Min:98.8 F (37.1 C), Max:99.3 F (37.4 C)  Recent Labs  Lab 01/12/22 1413 01/12/22 1936 01/12/22 2153 01/13/22 0634 01/14/22 0451 01/15/22 0438  WBC 12.6*  --   --  12.1* 8.6  --   CREATININE 5.08*  --   --  6.32* 8.33* 5.15*  LATICACIDVEN  --  1.2 1.3  --   --   --     Estimated Creatinine Clearance: 14.7 mL/min (A) (by C-G formula based on SCr of 5.15 mg/dL (H)).    No Known Allergies  Antimicrobials this admission: Cephalexin 2/11 >> Cefepime 2/7>> 2/9 Ceftriaxone 2/7 x 1 dose    Microbiology results: 2/7 BCX: + BCID E. Coli - sensitive to cefazolin,ceftriaxone  Thank you for allowing pharmacy to be a part of this patients care.  Margot Ables, PharmD Clinical Pharmacist 01/15/2022 10:13 AM

## 2022-01-15 NOTE — Progress Notes (Signed)
Nsg Discharge Note  Admit Date:  01/12/2022 Discharge date: 01/15/2022   Geramy Lamorte to be D/C'd Home per MD order.  AVS completed.   Patient/caregiver able to verbalize understanding.  Discharge Medication: Allergies as of 01/15/2022   No Known Allergies      Medication List     STOP taking these medications    ceFAZolin 2-4 GM/100ML-% IVPB Commonly known as: ANCEF   gabapentin 300 MG capsule Commonly known as: NEURONTIN   insulin aspart 100 UNIT/ML injection Commonly known as: novoLOG   insulin detemir 100 UNIT/ML injection Commonly known as: LEVEMIR   ondansetron 4 MG tablet Commonly known as: Zofran       TAKE these medications    calcium acetate 667 MG capsule Commonly known as: PHOSLO Take 2 capsules (1,334 mg total) by mouth 3 (three) times daily with meals.   cephALEXin 500 MG capsule Commonly known as: KEFLEX Take 1 capsule (500 mg total) by mouth daily for 2 days. On day of dialysis, take medication after dialysis session Start taking on: January 16, 2022   Darbepoetin Alfa 100 MCG/0.5ML Sosy injection Commonly known as: ARANESP Inject 0.5 mLs (100 mcg total) into the vein every Thursday with hemodialysis. Will be given at dialysis.   glucose blood test strip Commonly known as: OneTouch Verio Test three times daily What changed:  how much to take how to take this when to take this additional instructions   isosorbide mononitrate 60 MG 24 hr tablet Commonly known as: IMDUR Take 1 tablet (60 mg total) by mouth daily.   pantoprazole 20 MG tablet Commonly known as: PROTONIX Take 1 tablet (20 mg total) by mouth daily.   Vitamin D3 1.25 MG (50000 UT) Caps Take 1 capsule by mouth once a week.        Discharge Assessment: Vitals:   01/14/22 2024 01/15/22 0454  BP: (!) 158/75 (!) 160/81  Pulse: 96 91  Resp: 20 19  Temp: 99.3 F (37.4 C) 99 F (37.2 C)  SpO2: 100% 100%   Skin clean, dry and intact without evidence of skin break  down, no evidence of skin tears noted. IV catheter discontinued intact. Site without signs and symptoms of complications - no redness or edema noted at insertion site, patient denies c/o pain - only slight tenderness at site.  Dressing with slight pressure applied.  D/c Instructions-Education: Discharge instructions given to patient/family with verbalized understanding. D/c education completed with patient/family including follow up instructions, medication list, d/c activities limitations if indicated, with other d/c instructions as indicated by MD - patient able to verbalize understanding, all questions fully answered. Patient instructed to return to ED, call 911, or call MD for any changes in condition.  Patient escorted via [place], and D/C home via private auto.  Kathie Rhodes, RN 01/15/2022 11:16 AM

## 2022-01-16 DIAGNOSIS — N186 End stage renal disease: Secondary | ICD-10-CM | POA: Diagnosis not present

## 2022-01-16 DIAGNOSIS — N2581 Secondary hyperparathyroidism of renal origin: Secondary | ICD-10-CM | POA: Diagnosis not present

## 2022-01-16 DIAGNOSIS — Z992 Dependence on renal dialysis: Secondary | ICD-10-CM | POA: Diagnosis not present

## 2022-01-19 DIAGNOSIS — Z992 Dependence on renal dialysis: Secondary | ICD-10-CM | POA: Diagnosis not present

## 2022-01-19 DIAGNOSIS — N2581 Secondary hyperparathyroidism of renal origin: Secondary | ICD-10-CM | POA: Diagnosis not present

## 2022-01-19 DIAGNOSIS — N186 End stage renal disease: Secondary | ICD-10-CM | POA: Diagnosis not present

## 2022-01-19 LAB — CULTURE, BLOOD (ROUTINE X 2)
Culture: NO GROWTH
Culture: NO GROWTH
Special Requests: ADEQUATE
Special Requests: ADEQUATE
Special Requests: ADEQUATE

## 2022-01-21 DIAGNOSIS — N2581 Secondary hyperparathyroidism of renal origin: Secondary | ICD-10-CM | POA: Diagnosis not present

## 2022-01-21 DIAGNOSIS — Z992 Dependence on renal dialysis: Secondary | ICD-10-CM | POA: Diagnosis not present

## 2022-01-21 DIAGNOSIS — N186 End stage renal disease: Secondary | ICD-10-CM | POA: Diagnosis not present

## 2022-01-23 DIAGNOSIS — N2581 Secondary hyperparathyroidism of renal origin: Secondary | ICD-10-CM | POA: Diagnosis not present

## 2022-01-23 DIAGNOSIS — Z992 Dependence on renal dialysis: Secondary | ICD-10-CM | POA: Diagnosis not present

## 2022-01-23 DIAGNOSIS — N186 End stage renal disease: Secondary | ICD-10-CM | POA: Diagnosis not present

## 2022-01-26 DIAGNOSIS — Z992 Dependence on renal dialysis: Secondary | ICD-10-CM | POA: Diagnosis not present

## 2022-01-26 DIAGNOSIS — N186 End stage renal disease: Secondary | ICD-10-CM | POA: Diagnosis not present

## 2022-01-26 DIAGNOSIS — N2581 Secondary hyperparathyroidism of renal origin: Secondary | ICD-10-CM | POA: Diagnosis not present

## 2022-01-28 ENCOUNTER — Ambulatory Visit: Payer: Medicare HMO | Admitting: Urology

## 2022-01-28 DIAGNOSIS — N2581 Secondary hyperparathyroidism of renal origin: Secondary | ICD-10-CM | POA: Diagnosis not present

## 2022-01-28 DIAGNOSIS — Z992 Dependence on renal dialysis: Secondary | ICD-10-CM | POA: Diagnosis not present

## 2022-01-28 DIAGNOSIS — N186 End stage renal disease: Secondary | ICD-10-CM | POA: Diagnosis not present

## 2022-01-29 ENCOUNTER — Ambulatory Visit: Payer: Medicare HMO | Admitting: Physician Assistant

## 2022-01-29 ENCOUNTER — Ambulatory Visit: Payer: Medicare HMO | Admitting: Urology

## 2022-01-30 DIAGNOSIS — N186 End stage renal disease: Secondary | ICD-10-CM | POA: Diagnosis not present

## 2022-01-30 DIAGNOSIS — N2581 Secondary hyperparathyroidism of renal origin: Secondary | ICD-10-CM | POA: Diagnosis not present

## 2022-01-30 DIAGNOSIS — Z992 Dependence on renal dialysis: Secondary | ICD-10-CM | POA: Diagnosis not present

## 2022-02-02 DIAGNOSIS — N186 End stage renal disease: Secondary | ICD-10-CM | POA: Diagnosis not present

## 2022-02-02 DIAGNOSIS — N2581 Secondary hyperparathyroidism of renal origin: Secondary | ICD-10-CM | POA: Diagnosis not present

## 2022-02-02 DIAGNOSIS — N041 Nephrotic syndrome with focal and segmental glomerular lesions: Secondary | ICD-10-CM | POA: Diagnosis not present

## 2022-02-02 DIAGNOSIS — Z992 Dependence on renal dialysis: Secondary | ICD-10-CM | POA: Diagnosis not present

## 2022-02-04 ENCOUNTER — Ambulatory Visit: Payer: Medicare HMO | Admitting: Physician Assistant

## 2022-02-04 DIAGNOSIS — N2581 Secondary hyperparathyroidism of renal origin: Secondary | ICD-10-CM | POA: Diagnosis not present

## 2022-02-04 DIAGNOSIS — N186 End stage renal disease: Secondary | ICD-10-CM | POA: Diagnosis not present

## 2022-02-04 DIAGNOSIS — N1330 Unspecified hydronephrosis: Secondary | ICD-10-CM

## 2022-02-04 DIAGNOSIS — Z992 Dependence on renal dialysis: Secondary | ICD-10-CM | POA: Diagnosis not present

## 2022-02-06 DIAGNOSIS — Z992 Dependence on renal dialysis: Secondary | ICD-10-CM | POA: Diagnosis not present

## 2022-02-06 DIAGNOSIS — N2581 Secondary hyperparathyroidism of renal origin: Secondary | ICD-10-CM | POA: Diagnosis not present

## 2022-02-06 DIAGNOSIS — N186 End stage renal disease: Secondary | ICD-10-CM | POA: Diagnosis not present

## 2022-02-09 DIAGNOSIS — N186 End stage renal disease: Secondary | ICD-10-CM | POA: Diagnosis not present

## 2022-02-09 DIAGNOSIS — N2581 Secondary hyperparathyroidism of renal origin: Secondary | ICD-10-CM | POA: Diagnosis not present

## 2022-02-09 DIAGNOSIS — Z992 Dependence on renal dialysis: Secondary | ICD-10-CM | POA: Diagnosis not present

## 2022-02-10 ENCOUNTER — Encounter: Payer: Self-pay | Admitting: Physician Assistant

## 2022-02-10 ENCOUNTER — Ambulatory Visit (INDEPENDENT_AMBULATORY_CARE_PROVIDER_SITE_OTHER): Payer: Medicare HMO | Admitting: Physician Assistant

## 2022-02-10 ENCOUNTER — Other Ambulatory Visit: Payer: Self-pay

## 2022-02-10 VITALS — BP 175/73 | HR 78 | Ht 65.0 in | Wt 196.4 lb

## 2022-02-10 DIAGNOSIS — N1330 Unspecified hydronephrosis: Secondary | ICD-10-CM

## 2022-02-10 DIAGNOSIS — R34 Anuria and oliguria: Secondary | ICD-10-CM

## 2022-02-10 LAB — BLADDER SCAN AMB NON-IMAGING: Scan Result: 2

## 2022-02-10 NOTE — Progress Notes (Signed)
02/10/2022 11:46 AM   James [name] 14-Feb-1956 [id number]   Assessment:  1. Hydronephrosis, unspecified hydronephrosis type   2. Anuria    Plan: Patient's presentation and findings discussed with Dr. Pete Glatter who agrees with follow-up ultrasound in 1 month with office visit to discuss findings.  If hydronephrosis or signs of obstruction persist, will consider additional diagnostic evaluation.  The patient will continue his routine dialysis schedule.  Chief Complaint: Hydronephrosis  Referring provider: [facility]   History of Present Illness: 02/10/22 James [name] is a 66 y.o. year old male with h/o ESRD, on dialysis, and recent admission on 01/12/22 for E.Coli sepsis who is seen in consultation from [facility] for evaluation of abnormal finding seen on CT abdomen pelvis during hospitalization.  Imaging noted left-sided hydronephrosis with proximal left hydroureter but no obvious obstruction appreciated.  There was fat stranding felt to be an infectious process and the patient was treated with antibiotics and admission.  No urine cultures available because the patient is an uric.  Currently, he states he is doing well and has no further symptoms of infection.  He denies abdominal or flank pain and reports no history of urinary stone disease or pyelonephritis in the past.  The patient's dialysis schedule is noted below.  Dialysis has been ongoing for approximately 5 years.  Of note, the patient started smoking approximately 1 year ago.  Medical records and imaging studies as well as labs personally reviewed during patient's office visit. Bladder Scan=69ml  01/15/22: James [name] is a 66 y.o. male with medical history significant for asthma, CHF, ESRD with hemodialysis Tuesday Thursday Saturday, hypertension, diabetes mellitus type 2 who presents to the ED with chief complaint of left flank pain.  Patient reports that he has left flank pain that started 3 to 4  days ago.  Its been constant and not progressively worsening.  He has associated nausea but no vomiting.  He reports he has nothing to vomit because he has not eaten so long.  He reports his last normal meal was weeks ago.  Since then he has just been eating crackers and drinks.  He denies any fevers, but then reports that he had a fever and dialysis and that is why they were making him take a COVID test.  He denies chills.  In the emergency department, patient was found to be febrile with Tmax 101.4, with elevated heart rate and respiratory rate, elevated WBC 12.6, met sepsis criteria present on admission.  CT abdomen pelvis showed cholelithiasis without cholecystitis, circumferential rectal wall thickening could be indicative of proctitis, left-sided hydronephrosis with proximal left hydroureter with no obstruction, superimposed fat stranding could suggest infection.  Patient was started on Rocephin, Flagyl and admitted to the hospital   Blood cultures revealed E. coli.  Urine culture not obtained as patient is anuric. Patient's symptoms continued to improve. Sensitivity of blood culture resulted and patient was discharged home on keflex.   Past Medical History:  Past Medical History:  Diagnosis Date   Asthma    as a child   CHF (congestive heart failure) (HCC)    Chronic kidney disease    TTHS   Diabetes mellitus without complication (HCC)    Type II   ESRD (end stage renal disease) (HCC)    Hypertension     Past Surgical History:  Past Surgical History:  Procedure Laterality Date   AV FISTULA PLACEMENT Left 04/08/2015   Procedure: LEFT RADIOCEPHALIC ARTERIOVENOUS (AV) FISTULA CREATION;  Surgeon: Janetta Hora  Fields, MD;  Location: [facility] OR;  Service: Vascular;  Laterality: Left;   AV FISTULA PLACEMENT Right 09/15/2015   Procedure: RADIOCEPHALIC VERSUS BRACHIOCEPHALIC ARTERIOVENOUS (AV) FISTULA CREATION - RIGHT ARM;  Surgeon: Fransisco Hertz, MD;  Location: [facility] OR;  Service: Vascular;  Laterality:  Right;   AV FISTULA PLACEMENT Left 06/07/2016   Procedure: LEFT BRACHIOCEPHALIC ARTERIOVENOUS (AV) FISTULA CREATION;  Surgeon: Sherren Kerns, MD;  Location: [facility] OR;  Service: Vascular;  Laterality: Left;   COLONOSCOPY     IR GENERIC HISTORICAL  08/04/2016   IR REMOVAL TUN CV CATH W/O FL 08/04/2016 Brayton El, PA-C [facility]INTER[facility]   IR GENERIC HISTORICAL  08/06/2016   IR FLUORO GUIDE CV LINE LEFT 08/06/2016 Oley Balm, MD [facility]   IR GENERIC HISTORICAL  08/06/2016   IR US GUIDE VASC ACCESS LEFT 08/06/2016 Oley Balm, MD [facility]   LIGATION OF ARTERIOVENOUS  FISTULA Left 09/15/2015   Procedure: LIGATION OF RADIOCEPHALIC ARTERIOVENOUS FISTULA - LEFT ARM;  Surgeon: Fransisco Hertz, MD;  Location: [facility] OR;  Service: Vascular;  Laterality: Left;   LIGATION OF ARTERIOVENOUS  FISTULA Right 04/16/2016   Procedure: LIGATION OF ARTERIOVENOUS  FISTULA;  Surgeon: Sherren Kerns, MD;  Location: [facility] OR;  Service: Vascular;  Laterality: Right;   LIGATION OF COMPETING BRANCHES OF ARTERIOVENOUS FISTULA Left 07/16/2015   Procedure: LIGATION OF SIDE BRANCHES OF ARTERIOVENOUS FISTULA LEFT ARM;  Surgeon: Larina Earthly, MD;  Location: [facility] OR;  Service: Vascular;  Laterality: Left;   PERIPHERAL VASCULAR CATHETERIZATION N/A 07/04/2015   Procedure: Fistulagram;  Surgeon: Sherren Kerns, MD;  Location: [facility] INVASIVE CV LAB;  Service: Cardiovascular;  Laterality: N/A;   PERIPHERAL VASCULAR CATHETERIZATION N/A 09/08/2015   Procedure: Fistulagram;  Surgeon: Fransisco Hertz, MD;  Location: [facility] INVASIVE CV LAB;  Service: Cardiovascular;  Laterality: N/A;   PERIPHERAL VASCULAR CATHETERIZATION Right 02/23/2016   Procedure: Fistulagram;  Surgeon: Fransisco Hertz, MD;  Location: [facility] INVASIVE CV LAB;  Service: Cardiovascular;  Laterality: Right;   PERIPHERAL VASCULAR CATHETERIZATION N/A 04/12/2016   Procedure: Fistulagram;  Surgeon: Fransisco Hertz, MD;  Location: [facility] INVASIVE CV LAB;  Service: Cardiovascular;  Laterality: N/A;   REVISON OF  ARTERIOVENOUS FISTULA Right 03/10/2016   Procedure: RIGHT CEPHALIC VEIN TURNDOWN;  Surgeon: Fransisco Hertz, MD;  Location: [facility] OR;  Service: Vascular;  Laterality: Right;   TEE WITHOUT CARDIOVERSION N/A 08/06/2016   Procedure: TRANSESOPHAGEAL ECHOCARDIOGRAM (TEE);  Surgeon: Orpah Cobb, MD;  Location: [facility] ENDOSCOPY;  Service: Cardiovascular;  Laterality: N/A;    Allergies:  No Known Allergies  Family History:  Family History  Problem Relation Age of Onset   Hypertension Other     Social History:  Social History   Tobacco Use   Smoking status: Former    Years: 1.00    Types: Cigarettes    Quit date: 07/08/2005    Years since quitting: 16.6   Smokeless tobacco: Never  Substance Use Topics   Alcohol use: No    Alcohol/week: 0.0 standard drinks   Drug use: No    Review of symptoms:  Constitutional:  Negative for unexplained weight loss, night sweats, fever, chills ENT:  Negative for nose bleeds, sinus pain, painful swallowing CV:  Negative for chest pain Resp:  Negative for cough, wheezing, shortness of breath GI:  Negative for nausea, vomiting, diarrhea, bloody stools GU:  Positives noted in HPI Neuro:  Negative for seizures, poor balance, limb weakness, slurred speech Psych:  Negative for lack of  energy Endocrine:  Negative for polydipsia,  Hematologic:  Negative for anemia, purpura, petechia, prolonged or excessive bleeding, use of anticoagulants  Allergic:  Negative for difficulty breathing or choking as a result of exposure to anything; no shellfish allergy; no allergic response (rash/itch) to materials, foods   Physical Exam: BP (!) 175/73    Pulse 78    Ht 5\' 5"  (1.651 m)    BMI 32.10 kg/m   Constitutional:  Alert and oriented, No acute distress. HEENT: NCAT, moist mucus membranes.  Trachea midline, no masses. Cardiovascular: Regular rate and rhythm without murmur, rub, or gallops No clubbing, cyanosis, or edema. Respiratory: Normal respiratory effort, clear to  auscultation bilaterally GI: Abdomen is soft, nontender, nondistended, no abdominal masses BACK:  Non-tender to palpation.  No CVAT Skin: No obvious rashes, warm, dry, intact Neurologic: Alert and oriented, Cranial nerves grossly intact, no focal deficits, moving all 4 extremities. Psychiatric: Appropriate. Normal mood and affect.  Laboratory Data: No results found for this or any previous visit (from the past 24 hour(s)).  Lab Results  Component Value Date   WBC 8.6 01/14/2022   HGB 9.0 (L) 01/14/2022   HCT 27.3 (L) 01/14/2022   MCV 93.2 01/14/2022   PLT 235 01/14/2022    Lab Results  Component Value Date   CREATININE 5.15 (H) 01/15/2022    No results found for: PSA  No results found for: TESTOSTERONE  Lab Results  Component Value Date   HGBA1C 7.1 (H) 01/12/2022    Urinalysis    Component Value Date/Time   COLORURINE STRAW (A) 03/23/2015 0058   APPEARANCEUR CLEAR 03/23/2015 0058   LABSPEC 1.009 03/23/2015 0058   PHURINE 6.5 03/23/2015 0058   GLUCOSEU NEGATIVE 03/23/2015 0058   HGBUR MODERATE (A) 03/23/2015 0058   BILIRUBINUR NEGATIVE 03/23/2015 0058   KETONESUR NEGATIVE 03/23/2015 0058   PROTEINUR 100 (A) 03/23/2015 0058   UROBILINOGEN 0.2 03/23/2015 0058   NITRITE NEGATIVE 03/23/2015 0058   LEUKOCYTESUR NEGATIVE 03/23/2015 0058    Lab Results  Component Value Date   BACTERIA FEW (A) 03/23/2015    Pertinent Imaging: CLINICAL DATA:  Left lower quadrant abdominal pain since Saturday, history of multiple myeloma   EXAM: CT ABDOMEN AND PELVIS WITH CONTRAST   TECHNIQUE: Multidetector CT imaging of the abdomen and pelvis was performed using the standard protocol following bolus administration of intravenous contrast.   RADIATION DOSE REDUCTION: This exam was performed according to the departmental dose-optimization program which includes automated exposure control, adjustment of the mA and/or kV according to patient size and/or use of iterative  reconstruction technique.   CONTRAST:  OMNIPAQUE IOHEXOL 300 MG/ML  SOLN   COMPARISON:  None.   FINDINGS: Lower chest: No acute pleural or parenchymal lung disease.   Hepatobiliary: Small gallstones layer dependently within the gallbladder without cholecystitis. The liver is unremarkable. No biliary duct dilation.   Pancreas: Unremarkable. No pancreatic ductal dilatation or surrounding inflammatory changes.   Spleen: Normal in size without focal abnormality.   Adrenals/Urinary Tract: Bilateral renal cortical atrophy with numerous bilateral renal cortical cysts consistent with end-stage renal disease. No contrast excretion on delayed images consistent with renal dysfunction. There is mild left-sided hydronephrosis and proximal left hydroureter, without clear cause for obstruction. Mild left Peri ureteral and perinephric fat stranding. Superimposed infection cannot be excluded.   The bladder is completely decompressed, limiting its evaluation. The adrenals are unremarkable.   Stomach/Bowel: There is circumferential wall thickening of the rectum, measuring up to 1.3 cm in thickness.  02/10/2022 11:46 AM   James [name] 21-Nov-1956 [id number]   Assessment:  1. Hydronephrosis, unspecified hydronephrosis type   2. Anuria    Plan: Patient's presentation and findings discussed with Dr. Felipa Eth who agrees with follow-up ultrasound in 1 month with office visit to discuss findings.  If hydronephrosis or signs of obstruction persist, will consider additional diagnostic evaluation.  The patient will continue his routine dialysis schedule.  Chief Complaint: Hydronephrosis  Referring provider: [place]   History of Present Illness: 02/10/22 James [name] is a 66 y.o. year old male with h/o ESRD, on dialysis, and recent admission on 01/12/22 for E.Coli sepsis who is seen in consultation from [facility] for evaluation of abnormal finding seen on CT abdomen pelvis during hospitalization.  Imaging noted left-sided hydronephrosis with proximal left hydroureter but no obvious obstruction appreciated.  There was fat stranding felt to be an infectious process and the patient was treated with antibiotics and admission.  No urine cultures available because the patient is an uric.  Currently, he states he is doing well and has no further symptoms of infection.  He denies abdominal or flank pain and reports no history of urinary stone disease or pyelonephritis in the past.  The patient's dialysis schedule is noted below.  Dialysis has been ongoing for approximately 5 years.  Of note, the patient started smoking approximately 1 year ago.  Medical records and imaging studies as well as labs personally reviewed during patient's office visit. Bladder Scan=77m  01/15/22: James Wissneris a 66y.o. male with medical history significant for asthma, CHF, ESRD with hemodialysis Tuesday Thursday Saturday, hypertension, diabetes mellitus type 2 who presents to the ED with chief complaint of left flank pain.  Patient reports that he has left flank pain that started 3 to 4  days ago.  Its been constant and not progressively worsening.  He has associated nausea but no vomiting.  He reports he has nothing to vomit because he has not eaten so long.  He reports his last normal meal was weeks ago.  Since then he has just been eating crackers and drinks.  He denies any fevers, but then reports that he had a fever and dialysis and that is why they were making him take a COVID test.  He denies chills.  In the emergency department, patient was found to be febrile with Tmax 101.4, with elevated heart rate and respiratory rate, elevated WBC 12.6, met sepsis criteria present on admission.  CT abdomen pelvis showed cholelithiasis without cholecystitis, circumferential rectal wall thickening could be indicative of proctitis, left-sided hydronephrosis with proximal left hydroureter with no obstruction, superimposed fat stranding could suggest infection.  Patient was started on Rocephin, Flagyl and admitted to the hospital   Blood cultures revealed E. coli.  Urine culture not obtained as patient is anuric. Patient's symptoms continued to improve. Sensitivity of blood culture resulted and patient was discharged home on keflex.   Past Medical History:  Past Medical History:  Diagnosis Date   Asthma    as a child   CH[place] ([place]estive heart failure) ([place]    Chronic kidney disease    TTHS   Diabetes mellitus without complication ([place]    Type II   ESRD ([place] stage renal disease) ([place]    Hypertension     Past Surgical History:  Past Surgical History:  Procedure Laterality Date   AV FISTULA PLACEMENT Left 04/08/2015   Procedure: LEFT RADIOCEPHALIC ARTERIOVENOUS (AV) FISTULA CREATION;  Surgeon: CJuanda Crumble

## 2022-02-10 NOTE — Progress Notes (Signed)
post void residual =2mL 

## 2022-02-11 ENCOUNTER — Ambulatory Visit (INDEPENDENT_AMBULATORY_CARE_PROVIDER_SITE_OTHER): Payer: Medicare HMO | Admitting: Gastroenterology

## 2022-02-11 DIAGNOSIS — N2581 Secondary hyperparathyroidism of renal origin: Secondary | ICD-10-CM | POA: Diagnosis not present

## 2022-02-11 DIAGNOSIS — N186 End stage renal disease: Secondary | ICD-10-CM | POA: Diagnosis not present

## 2022-02-11 DIAGNOSIS — Z992 Dependence on renal dialysis: Secondary | ICD-10-CM | POA: Diagnosis not present

## 2022-02-13 DIAGNOSIS — Z992 Dependence on renal dialysis: Secondary | ICD-10-CM | POA: Diagnosis not present

## 2022-02-13 DIAGNOSIS — N2581 Secondary hyperparathyroidism of renal origin: Secondary | ICD-10-CM | POA: Diagnosis not present

## 2022-02-13 DIAGNOSIS — N186 End stage renal disease: Secondary | ICD-10-CM | POA: Diagnosis not present

## 2022-02-16 DIAGNOSIS — N186 End stage renal disease: Secondary | ICD-10-CM | POA: Diagnosis not present

## 2022-02-16 DIAGNOSIS — N2581 Secondary hyperparathyroidism of renal origin: Secondary | ICD-10-CM | POA: Diagnosis not present

## 2022-02-16 DIAGNOSIS — Z992 Dependence on renal dialysis: Secondary | ICD-10-CM | POA: Diagnosis not present

## 2022-02-18 DIAGNOSIS — N2581 Secondary hyperparathyroidism of renal origin: Secondary | ICD-10-CM | POA: Diagnosis not present

## 2022-02-18 DIAGNOSIS — Z992 Dependence on renal dialysis: Secondary | ICD-10-CM | POA: Diagnosis not present

## 2022-02-18 DIAGNOSIS — N186 End stage renal disease: Secondary | ICD-10-CM | POA: Diagnosis not present

## 2022-02-20 DIAGNOSIS — N2581 Secondary hyperparathyroidism of renal origin: Secondary | ICD-10-CM | POA: Diagnosis not present

## 2022-02-20 DIAGNOSIS — N186 End stage renal disease: Secondary | ICD-10-CM | POA: Diagnosis not present

## 2022-02-20 DIAGNOSIS — Z992 Dependence on renal dialysis: Secondary | ICD-10-CM | POA: Diagnosis not present

## 2022-02-23 DIAGNOSIS — N2581 Secondary hyperparathyroidism of renal origin: Secondary | ICD-10-CM | POA: Diagnosis not present

## 2022-02-23 DIAGNOSIS — N186 End stage renal disease: Secondary | ICD-10-CM | POA: Diagnosis not present

## 2022-02-23 DIAGNOSIS — Z992 Dependence on renal dialysis: Secondary | ICD-10-CM | POA: Diagnosis not present

## 2022-02-25 ENCOUNTER — Ambulatory Visit (HOSPITAL_COMMUNITY): Admission: RE | Admit: 2022-02-25 | Payer: Medicare HMO | Source: Ambulatory Visit

## 2022-02-25 DIAGNOSIS — Z992 Dependence on renal dialysis: Secondary | ICD-10-CM | POA: Diagnosis not present

## 2022-02-25 DIAGNOSIS — N186 End stage renal disease: Secondary | ICD-10-CM | POA: Diagnosis not present

## 2022-02-25 DIAGNOSIS — N2581 Secondary hyperparathyroidism of renal origin: Secondary | ICD-10-CM | POA: Diagnosis not present

## 2022-02-27 DIAGNOSIS — N186 End stage renal disease: Secondary | ICD-10-CM | POA: Diagnosis not present

## 2022-02-27 DIAGNOSIS — Z992 Dependence on renal dialysis: Secondary | ICD-10-CM | POA: Diagnosis not present

## 2022-02-27 DIAGNOSIS — N2581 Secondary hyperparathyroidism of renal origin: Secondary | ICD-10-CM | POA: Diagnosis not present

## 2022-03-02 DIAGNOSIS — Z992 Dependence on renal dialysis: Secondary | ICD-10-CM | POA: Diagnosis not present

## 2022-03-02 DIAGNOSIS — N2581 Secondary hyperparathyroidism of renal origin: Secondary | ICD-10-CM | POA: Diagnosis not present

## 2022-03-02 DIAGNOSIS — N186 End stage renal disease: Secondary | ICD-10-CM | POA: Diagnosis not present

## 2022-03-03 ENCOUNTER — Ambulatory Visit: Payer: Medicare HMO | Admitting: Physician Assistant

## 2022-03-04 DIAGNOSIS — N2581 Secondary hyperparathyroidism of renal origin: Secondary | ICD-10-CM | POA: Diagnosis not present

## 2022-03-04 DIAGNOSIS — Z992 Dependence on renal dialysis: Secondary | ICD-10-CM | POA: Diagnosis not present

## 2022-03-04 DIAGNOSIS — N186 End stage renal disease: Secondary | ICD-10-CM | POA: Diagnosis not present

## 2022-03-05 DIAGNOSIS — N041 Nephrotic syndrome with focal and segmental glomerular lesions: Secondary | ICD-10-CM | POA: Diagnosis not present

## 2022-03-05 DIAGNOSIS — N186 End stage renal disease: Secondary | ICD-10-CM | POA: Diagnosis not present

## 2022-03-05 DIAGNOSIS — Z992 Dependence on renal dialysis: Secondary | ICD-10-CM | POA: Diagnosis not present

## 2022-03-06 DIAGNOSIS — N186 End stage renal disease: Secondary | ICD-10-CM | POA: Diagnosis not present

## 2022-03-06 DIAGNOSIS — Z992 Dependence on renal dialysis: Secondary | ICD-10-CM | POA: Diagnosis not present

## 2022-03-06 DIAGNOSIS — N2581 Secondary hyperparathyroidism of renal origin: Secondary | ICD-10-CM | POA: Diagnosis not present

## 2022-03-09 DIAGNOSIS — N186 End stage renal disease: Secondary | ICD-10-CM | POA: Diagnosis not present

## 2022-03-09 DIAGNOSIS — N2581 Secondary hyperparathyroidism of renal origin: Secondary | ICD-10-CM | POA: Diagnosis not present

## 2022-03-09 DIAGNOSIS — Z992 Dependence on renal dialysis: Secondary | ICD-10-CM | POA: Diagnosis not present

## 2022-03-11 DIAGNOSIS — N2581 Secondary hyperparathyroidism of renal origin: Secondary | ICD-10-CM | POA: Diagnosis not present

## 2022-03-11 DIAGNOSIS — Z992 Dependence on renal dialysis: Secondary | ICD-10-CM | POA: Diagnosis not present

## 2022-03-11 DIAGNOSIS — N186 End stage renal disease: Secondary | ICD-10-CM | POA: Diagnosis not present

## 2022-03-13 DIAGNOSIS — N2581 Secondary hyperparathyroidism of renal origin: Secondary | ICD-10-CM | POA: Diagnosis not present

## 2022-03-13 DIAGNOSIS — N186 End stage renal disease: Secondary | ICD-10-CM | POA: Diagnosis not present

## 2022-03-13 DIAGNOSIS — Z992 Dependence on renal dialysis: Secondary | ICD-10-CM | POA: Diagnosis not present

## 2022-03-16 DIAGNOSIS — N186 End stage renal disease: Secondary | ICD-10-CM | POA: Diagnosis not present

## 2022-03-16 DIAGNOSIS — Z992 Dependence on renal dialysis: Secondary | ICD-10-CM | POA: Diagnosis not present

## 2022-03-16 DIAGNOSIS — N2581 Secondary hyperparathyroidism of renal origin: Secondary | ICD-10-CM | POA: Diagnosis not present

## 2022-03-18 ENCOUNTER — Ambulatory Visit (INDEPENDENT_AMBULATORY_CARE_PROVIDER_SITE_OTHER): Payer: Medicare HMO | Admitting: Gastroenterology

## 2022-03-18 DIAGNOSIS — Z992 Dependence on renal dialysis: Secondary | ICD-10-CM | POA: Diagnosis not present

## 2022-03-18 DIAGNOSIS — N186 End stage renal disease: Secondary | ICD-10-CM | POA: Diagnosis not present

## 2022-03-18 DIAGNOSIS — N2581 Secondary hyperparathyroidism of renal origin: Secondary | ICD-10-CM | POA: Diagnosis not present

## 2022-03-20 DIAGNOSIS — Z992 Dependence on renal dialysis: Secondary | ICD-10-CM | POA: Diagnosis not present

## 2022-03-20 DIAGNOSIS — N2581 Secondary hyperparathyroidism of renal origin: Secondary | ICD-10-CM | POA: Diagnosis not present

## 2022-03-20 DIAGNOSIS — N186 End stage renal disease: Secondary | ICD-10-CM | POA: Diagnosis not present

## 2022-03-23 DIAGNOSIS — Z992 Dependence on renal dialysis: Secondary | ICD-10-CM | POA: Diagnosis not present

## 2022-03-23 DIAGNOSIS — N186 End stage renal disease: Secondary | ICD-10-CM | POA: Diagnosis not present

## 2022-03-23 DIAGNOSIS — N2581 Secondary hyperparathyroidism of renal origin: Secondary | ICD-10-CM | POA: Diagnosis not present

## 2022-03-25 DIAGNOSIS — N2581 Secondary hyperparathyroidism of renal origin: Secondary | ICD-10-CM | POA: Diagnosis not present

## 2022-03-25 DIAGNOSIS — N186 End stage renal disease: Secondary | ICD-10-CM | POA: Diagnosis not present

## 2022-03-25 DIAGNOSIS — Z992 Dependence on renal dialysis: Secondary | ICD-10-CM | POA: Diagnosis not present

## 2022-03-27 DIAGNOSIS — Z992 Dependence on renal dialysis: Secondary | ICD-10-CM | POA: Diagnosis not present

## 2022-03-27 DIAGNOSIS — N2581 Secondary hyperparathyroidism of renal origin: Secondary | ICD-10-CM | POA: Diagnosis not present

## 2022-03-27 DIAGNOSIS — N186 End stage renal disease: Secondary | ICD-10-CM | POA: Diagnosis not present

## 2022-03-30 DIAGNOSIS — Z992 Dependence on renal dialysis: Secondary | ICD-10-CM | POA: Diagnosis not present

## 2022-03-30 DIAGNOSIS — N2581 Secondary hyperparathyroidism of renal origin: Secondary | ICD-10-CM | POA: Diagnosis not present

## 2022-03-30 DIAGNOSIS — N186 End stage renal disease: Secondary | ICD-10-CM | POA: Diagnosis not present

## 2022-04-01 DIAGNOSIS — N186 End stage renal disease: Secondary | ICD-10-CM | POA: Diagnosis not present

## 2022-04-01 DIAGNOSIS — N2581 Secondary hyperparathyroidism of renal origin: Secondary | ICD-10-CM | POA: Diagnosis not present

## 2022-04-01 DIAGNOSIS — Z992 Dependence on renal dialysis: Secondary | ICD-10-CM | POA: Diagnosis not present

## 2022-04-03 DIAGNOSIS — N186 End stage renal disease: Secondary | ICD-10-CM | POA: Diagnosis not present

## 2022-04-03 DIAGNOSIS — N2581 Secondary hyperparathyroidism of renal origin: Secondary | ICD-10-CM | POA: Diagnosis not present

## 2022-04-03 DIAGNOSIS — Z992 Dependence on renal dialysis: Secondary | ICD-10-CM | POA: Diagnosis not present

## 2022-04-04 DIAGNOSIS — N186 End stage renal disease: Secondary | ICD-10-CM | POA: Diagnosis not present

## 2022-04-04 DIAGNOSIS — Z992 Dependence on renal dialysis: Secondary | ICD-10-CM | POA: Diagnosis not present

## 2022-04-04 DIAGNOSIS — N041 Nephrotic syndrome with focal and segmental glomerular lesions: Secondary | ICD-10-CM | POA: Diagnosis not present

## 2022-04-06 DIAGNOSIS — Z992 Dependence on renal dialysis: Secondary | ICD-10-CM | POA: Diagnosis not present

## 2022-04-06 DIAGNOSIS — N2581 Secondary hyperparathyroidism of renal origin: Secondary | ICD-10-CM | POA: Diagnosis not present

## 2022-04-06 DIAGNOSIS — N186 End stage renal disease: Secondary | ICD-10-CM | POA: Diagnosis not present

## 2022-04-08 DIAGNOSIS — N2581 Secondary hyperparathyroidism of renal origin: Secondary | ICD-10-CM | POA: Diagnosis not present

## 2022-04-08 DIAGNOSIS — N186 End stage renal disease: Secondary | ICD-10-CM | POA: Diagnosis not present

## 2022-04-08 DIAGNOSIS — Z992 Dependence on renal dialysis: Secondary | ICD-10-CM | POA: Diagnosis not present

## 2022-04-10 DIAGNOSIS — N186 End stage renal disease: Secondary | ICD-10-CM | POA: Diagnosis not present

## 2022-04-10 DIAGNOSIS — N2581 Secondary hyperparathyroidism of renal origin: Secondary | ICD-10-CM | POA: Diagnosis not present

## 2022-04-10 DIAGNOSIS — Z992 Dependence on renal dialysis: Secondary | ICD-10-CM | POA: Diagnosis not present

## 2022-04-13 ENCOUNTER — Encounter (INDEPENDENT_AMBULATORY_CARE_PROVIDER_SITE_OTHER): Payer: Self-pay | Admitting: Gastroenterology

## 2022-04-13 ENCOUNTER — Ambulatory Visit (INDEPENDENT_AMBULATORY_CARE_PROVIDER_SITE_OTHER): Payer: Medicare HMO | Admitting: Gastroenterology

## 2022-04-13 DIAGNOSIS — Z992 Dependence on renal dialysis: Secondary | ICD-10-CM | POA: Diagnosis not present

## 2022-04-13 DIAGNOSIS — N186 End stage renal disease: Secondary | ICD-10-CM | POA: Diagnosis not present

## 2022-04-13 DIAGNOSIS — N2581 Secondary hyperparathyroidism of renal origin: Secondary | ICD-10-CM | POA: Diagnosis not present

## 2022-04-14 ENCOUNTER — Encounter (INDEPENDENT_AMBULATORY_CARE_PROVIDER_SITE_OTHER): Payer: Self-pay | Admitting: *Deleted

## 2022-04-15 DIAGNOSIS — N2581 Secondary hyperparathyroidism of renal origin: Secondary | ICD-10-CM | POA: Diagnosis not present

## 2022-04-15 DIAGNOSIS — N186 End stage renal disease: Secondary | ICD-10-CM | POA: Diagnosis not present

## 2022-04-15 DIAGNOSIS — Z992 Dependence on renal dialysis: Secondary | ICD-10-CM | POA: Diagnosis not present

## 2022-04-17 DIAGNOSIS — N2581 Secondary hyperparathyroidism of renal origin: Secondary | ICD-10-CM | POA: Diagnosis not present

## 2022-04-17 DIAGNOSIS — N186 End stage renal disease: Secondary | ICD-10-CM | POA: Diagnosis not present

## 2022-04-17 DIAGNOSIS — Z992 Dependence on renal dialysis: Secondary | ICD-10-CM | POA: Diagnosis not present

## 2022-04-20 DIAGNOSIS — N2581 Secondary hyperparathyroidism of renal origin: Secondary | ICD-10-CM | POA: Diagnosis not present

## 2022-04-20 DIAGNOSIS — N186 End stage renal disease: Secondary | ICD-10-CM | POA: Diagnosis not present

## 2022-04-20 DIAGNOSIS — Z992 Dependence on renal dialysis: Secondary | ICD-10-CM | POA: Diagnosis not present

## 2022-04-22 DIAGNOSIS — N2581 Secondary hyperparathyroidism of renal origin: Secondary | ICD-10-CM | POA: Diagnosis not present

## 2022-04-22 DIAGNOSIS — N186 End stage renal disease: Secondary | ICD-10-CM | POA: Diagnosis not present

## 2022-04-22 DIAGNOSIS — Z992 Dependence on renal dialysis: Secondary | ICD-10-CM | POA: Diagnosis not present

## 2022-04-24 DIAGNOSIS — N2581 Secondary hyperparathyroidism of renal origin: Secondary | ICD-10-CM | POA: Diagnosis not present

## 2022-04-24 DIAGNOSIS — Z992 Dependence on renal dialysis: Secondary | ICD-10-CM | POA: Diagnosis not present

## 2022-04-24 DIAGNOSIS — N186 End stage renal disease: Secondary | ICD-10-CM | POA: Diagnosis not present

## 2022-04-27 DIAGNOSIS — Z992 Dependence on renal dialysis: Secondary | ICD-10-CM | POA: Diagnosis not present

## 2022-04-27 DIAGNOSIS — N2581 Secondary hyperparathyroidism of renal origin: Secondary | ICD-10-CM | POA: Diagnosis not present

## 2022-04-27 DIAGNOSIS — N186 End stage renal disease: Secondary | ICD-10-CM | POA: Diagnosis not present

## 2022-04-29 DIAGNOSIS — N2581 Secondary hyperparathyroidism of renal origin: Secondary | ICD-10-CM | POA: Diagnosis not present

## 2022-04-29 DIAGNOSIS — N186 End stage renal disease: Secondary | ICD-10-CM | POA: Diagnosis not present

## 2022-04-29 DIAGNOSIS — Z992 Dependence on renal dialysis: Secondary | ICD-10-CM | POA: Diagnosis not present

## 2022-05-01 DIAGNOSIS — N2581 Secondary hyperparathyroidism of renal origin: Secondary | ICD-10-CM | POA: Diagnosis not present

## 2022-05-01 DIAGNOSIS — N186 End stage renal disease: Secondary | ICD-10-CM | POA: Diagnosis not present

## 2022-05-01 DIAGNOSIS — Z992 Dependence on renal dialysis: Secondary | ICD-10-CM | POA: Diagnosis not present

## 2022-05-04 DIAGNOSIS — N2581 Secondary hyperparathyroidism of renal origin: Secondary | ICD-10-CM | POA: Diagnosis not present

## 2022-05-04 DIAGNOSIS — Z992 Dependence on renal dialysis: Secondary | ICD-10-CM | POA: Diagnosis not present

## 2022-05-04 DIAGNOSIS — N186 End stage renal disease: Secondary | ICD-10-CM | POA: Diagnosis not present

## 2022-05-05 DIAGNOSIS — N041 Nephrotic syndrome with focal and segmental glomerular lesions: Secondary | ICD-10-CM | POA: Diagnosis not present

## 2022-05-05 DIAGNOSIS — Z992 Dependence on renal dialysis: Secondary | ICD-10-CM | POA: Diagnosis not present

## 2022-05-05 DIAGNOSIS — N186 End stage renal disease: Secondary | ICD-10-CM | POA: Diagnosis not present

## 2022-05-06 DIAGNOSIS — N2581 Secondary hyperparathyroidism of renal origin: Secondary | ICD-10-CM | POA: Diagnosis not present

## 2022-05-06 DIAGNOSIS — N186 End stage renal disease: Secondary | ICD-10-CM | POA: Diagnosis not present

## 2022-05-06 DIAGNOSIS — Z992 Dependence on renal dialysis: Secondary | ICD-10-CM | POA: Diagnosis not present

## 2022-05-08 DIAGNOSIS — N2581 Secondary hyperparathyroidism of renal origin: Secondary | ICD-10-CM | POA: Diagnosis not present

## 2022-05-08 DIAGNOSIS — Z992 Dependence on renal dialysis: Secondary | ICD-10-CM | POA: Diagnosis not present

## 2022-05-08 DIAGNOSIS — N186 End stage renal disease: Secondary | ICD-10-CM | POA: Diagnosis not present

## 2022-05-11 DIAGNOSIS — N186 End stage renal disease: Secondary | ICD-10-CM | POA: Diagnosis not present

## 2022-05-11 DIAGNOSIS — N2581 Secondary hyperparathyroidism of renal origin: Secondary | ICD-10-CM | POA: Diagnosis not present

## 2022-05-11 DIAGNOSIS — Z992 Dependence on renal dialysis: Secondary | ICD-10-CM | POA: Diagnosis not present

## 2022-05-13 DIAGNOSIS — Z992 Dependence on renal dialysis: Secondary | ICD-10-CM | POA: Diagnosis not present

## 2022-05-13 DIAGNOSIS — N2581 Secondary hyperparathyroidism of renal origin: Secondary | ICD-10-CM | POA: Diagnosis not present

## 2022-05-13 DIAGNOSIS — N186 End stage renal disease: Secondary | ICD-10-CM | POA: Diagnosis not present

## 2022-05-15 DIAGNOSIS — Z992 Dependence on renal dialysis: Secondary | ICD-10-CM | POA: Diagnosis not present

## 2022-05-15 DIAGNOSIS — N186 End stage renal disease: Secondary | ICD-10-CM | POA: Diagnosis not present

## 2022-05-15 DIAGNOSIS — N2581 Secondary hyperparathyroidism of renal origin: Secondary | ICD-10-CM | POA: Diagnosis not present

## 2022-05-18 DIAGNOSIS — N186 End stage renal disease: Secondary | ICD-10-CM | POA: Diagnosis not present

## 2022-05-18 DIAGNOSIS — Z992 Dependence on renal dialysis: Secondary | ICD-10-CM | POA: Diagnosis not present

## 2022-05-18 DIAGNOSIS — N2581 Secondary hyperparathyroidism of renal origin: Secondary | ICD-10-CM | POA: Diagnosis not present

## 2022-05-20 DIAGNOSIS — Z992 Dependence on renal dialysis: Secondary | ICD-10-CM | POA: Diagnosis not present

## 2022-05-20 DIAGNOSIS — N186 End stage renal disease: Secondary | ICD-10-CM | POA: Diagnosis not present

## 2022-05-20 DIAGNOSIS — N2581 Secondary hyperparathyroidism of renal origin: Secondary | ICD-10-CM | POA: Diagnosis not present

## 2022-05-22 DIAGNOSIS — N2581 Secondary hyperparathyroidism of renal origin: Secondary | ICD-10-CM | POA: Diagnosis not present

## 2022-05-22 DIAGNOSIS — Z992 Dependence on renal dialysis: Secondary | ICD-10-CM | POA: Diagnosis not present

## 2022-05-22 DIAGNOSIS — N186 End stage renal disease: Secondary | ICD-10-CM | POA: Diagnosis not present

## 2022-05-25 DIAGNOSIS — N186 End stage renal disease: Secondary | ICD-10-CM | POA: Diagnosis not present

## 2022-05-25 DIAGNOSIS — N2581 Secondary hyperparathyroidism of renal origin: Secondary | ICD-10-CM | POA: Diagnosis not present

## 2022-05-25 DIAGNOSIS — Z992 Dependence on renal dialysis: Secondary | ICD-10-CM | POA: Diagnosis not present

## 2022-05-27 DIAGNOSIS — N2581 Secondary hyperparathyroidism of renal origin: Secondary | ICD-10-CM | POA: Diagnosis not present

## 2022-05-27 DIAGNOSIS — N186 End stage renal disease: Secondary | ICD-10-CM | POA: Diagnosis not present

## 2022-05-27 DIAGNOSIS — Z992 Dependence on renal dialysis: Secondary | ICD-10-CM | POA: Diagnosis not present

## 2022-05-29 DIAGNOSIS — N2581 Secondary hyperparathyroidism of renal origin: Secondary | ICD-10-CM | POA: Diagnosis not present

## 2022-05-29 DIAGNOSIS — N186 End stage renal disease: Secondary | ICD-10-CM | POA: Diagnosis not present

## 2022-05-29 DIAGNOSIS — Z992 Dependence on renal dialysis: Secondary | ICD-10-CM | POA: Diagnosis not present

## 2022-06-01 DIAGNOSIS — N186 End stage renal disease: Secondary | ICD-10-CM | POA: Diagnosis not present

## 2022-06-01 DIAGNOSIS — N2581 Secondary hyperparathyroidism of renal origin: Secondary | ICD-10-CM | POA: Diagnosis not present

## 2022-06-01 DIAGNOSIS — Z992 Dependence on renal dialysis: Secondary | ICD-10-CM | POA: Diagnosis not present

## 2022-06-03 DIAGNOSIS — N2581 Secondary hyperparathyroidism of renal origin: Secondary | ICD-10-CM | POA: Diagnosis not present

## 2022-06-03 DIAGNOSIS — N186 End stage renal disease: Secondary | ICD-10-CM | POA: Diagnosis not present

## 2022-06-03 DIAGNOSIS — Z992 Dependence on renal dialysis: Secondary | ICD-10-CM | POA: Diagnosis not present

## 2022-06-04 DIAGNOSIS — N186 End stage renal disease: Secondary | ICD-10-CM | POA: Diagnosis not present

## 2022-06-04 DIAGNOSIS — Z992 Dependence on renal dialysis: Secondary | ICD-10-CM | POA: Diagnosis not present

## 2022-06-04 DIAGNOSIS — N041 Nephrotic syndrome with focal and segmental glomerular lesions: Secondary | ICD-10-CM | POA: Diagnosis not present

## 2022-06-05 DIAGNOSIS — Z992 Dependence on renal dialysis: Secondary | ICD-10-CM | POA: Diagnosis not present

## 2022-06-05 DIAGNOSIS — N2581 Secondary hyperparathyroidism of renal origin: Secondary | ICD-10-CM | POA: Diagnosis not present

## 2022-06-05 DIAGNOSIS — N186 End stage renal disease: Secondary | ICD-10-CM | POA: Diagnosis not present

## 2022-06-08 DIAGNOSIS — Z992 Dependence on renal dialysis: Secondary | ICD-10-CM | POA: Diagnosis not present

## 2022-06-08 DIAGNOSIS — N2581 Secondary hyperparathyroidism of renal origin: Secondary | ICD-10-CM | POA: Diagnosis not present

## 2022-06-08 DIAGNOSIS — N186 End stage renal disease: Secondary | ICD-10-CM | POA: Diagnosis not present

## 2022-06-10 DIAGNOSIS — N2581 Secondary hyperparathyroidism of renal origin: Secondary | ICD-10-CM | POA: Diagnosis not present

## 2022-06-10 DIAGNOSIS — N186 End stage renal disease: Secondary | ICD-10-CM | POA: Diagnosis not present

## 2022-06-10 DIAGNOSIS — Z992 Dependence on renal dialysis: Secondary | ICD-10-CM | POA: Diagnosis not present

## 2022-06-12 DIAGNOSIS — N186 End stage renal disease: Secondary | ICD-10-CM | POA: Diagnosis not present

## 2022-06-12 DIAGNOSIS — N2581 Secondary hyperparathyroidism of renal origin: Secondary | ICD-10-CM | POA: Diagnosis not present

## 2022-06-12 DIAGNOSIS — Z992 Dependence on renal dialysis: Secondary | ICD-10-CM | POA: Diagnosis not present

## 2022-06-15 DIAGNOSIS — N2581 Secondary hyperparathyroidism of renal origin: Secondary | ICD-10-CM | POA: Diagnosis not present

## 2022-06-15 DIAGNOSIS — N186 End stage renal disease: Secondary | ICD-10-CM | POA: Diagnosis not present

## 2022-06-15 DIAGNOSIS — Z992 Dependence on renal dialysis: Secondary | ICD-10-CM | POA: Diagnosis not present

## 2022-06-17 DIAGNOSIS — N186 End stage renal disease: Secondary | ICD-10-CM | POA: Diagnosis not present

## 2022-06-17 DIAGNOSIS — Z992 Dependence on renal dialysis: Secondary | ICD-10-CM | POA: Diagnosis not present

## 2022-06-17 DIAGNOSIS — N2581 Secondary hyperparathyroidism of renal origin: Secondary | ICD-10-CM | POA: Diagnosis not present

## 2022-06-19 DIAGNOSIS — N186 End stage renal disease: Secondary | ICD-10-CM | POA: Diagnosis not present

## 2022-06-19 DIAGNOSIS — Z992 Dependence on renal dialysis: Secondary | ICD-10-CM | POA: Diagnosis not present

## 2022-06-19 DIAGNOSIS — N2581 Secondary hyperparathyroidism of renal origin: Secondary | ICD-10-CM | POA: Diagnosis not present

## 2022-06-22 DIAGNOSIS — Z992 Dependence on renal dialysis: Secondary | ICD-10-CM | POA: Diagnosis not present

## 2022-06-22 DIAGNOSIS — N2581 Secondary hyperparathyroidism of renal origin: Secondary | ICD-10-CM | POA: Diagnosis not present

## 2022-06-22 DIAGNOSIS — N186 End stage renal disease: Secondary | ICD-10-CM | POA: Diagnosis not present

## 2022-06-24 DIAGNOSIS — N186 End stage renal disease: Secondary | ICD-10-CM | POA: Diagnosis not present

## 2022-06-24 DIAGNOSIS — N2581 Secondary hyperparathyroidism of renal origin: Secondary | ICD-10-CM | POA: Diagnosis not present

## 2022-06-24 DIAGNOSIS — Z992 Dependence on renal dialysis: Secondary | ICD-10-CM | POA: Diagnosis not present

## 2022-06-26 DIAGNOSIS — Z992 Dependence on renal dialysis: Secondary | ICD-10-CM | POA: Diagnosis not present

## 2022-06-26 DIAGNOSIS — N186 End stage renal disease: Secondary | ICD-10-CM | POA: Diagnosis not present

## 2022-06-26 DIAGNOSIS — N2581 Secondary hyperparathyroidism of renal origin: Secondary | ICD-10-CM | POA: Diagnosis not present

## 2022-06-29 DIAGNOSIS — Z992 Dependence on renal dialysis: Secondary | ICD-10-CM | POA: Diagnosis not present

## 2022-06-29 DIAGNOSIS — N2581 Secondary hyperparathyroidism of renal origin: Secondary | ICD-10-CM | POA: Diagnosis not present

## 2022-06-29 DIAGNOSIS — N186 End stage renal disease: Secondary | ICD-10-CM | POA: Diagnosis not present

## 2022-07-01 DIAGNOSIS — Z992 Dependence on renal dialysis: Secondary | ICD-10-CM | POA: Diagnosis not present

## 2022-07-01 DIAGNOSIS — N2581 Secondary hyperparathyroidism of renal origin: Secondary | ICD-10-CM | POA: Diagnosis not present

## 2022-07-01 DIAGNOSIS — N186 End stage renal disease: Secondary | ICD-10-CM | POA: Diagnosis not present

## 2022-07-03 DIAGNOSIS — Z992 Dependence on renal dialysis: Secondary | ICD-10-CM | POA: Diagnosis not present

## 2022-07-03 DIAGNOSIS — N186 End stage renal disease: Secondary | ICD-10-CM | POA: Diagnosis not present

## 2022-07-03 DIAGNOSIS — N2581 Secondary hyperparathyroidism of renal origin: Secondary | ICD-10-CM | POA: Diagnosis not present

## 2022-07-05 DIAGNOSIS — T82898A Other specified complication of vascular prosthetic devices, implants and grafts, initial encounter: Secondary | ICD-10-CM | POA: Diagnosis not present

## 2022-07-05 DIAGNOSIS — N186 End stage renal disease: Secondary | ICD-10-CM | POA: Diagnosis not present

## 2022-07-05 DIAGNOSIS — N041 Nephrotic syndrome with focal and segmental glomerular lesions: Secondary | ICD-10-CM | POA: Diagnosis not present

## 2022-07-05 DIAGNOSIS — Z992 Dependence on renal dialysis: Secondary | ICD-10-CM | POA: Diagnosis not present

## 2022-07-06 DIAGNOSIS — N186 End stage renal disease: Secondary | ICD-10-CM | POA: Diagnosis not present

## 2022-07-06 DIAGNOSIS — Z992 Dependence on renal dialysis: Secondary | ICD-10-CM | POA: Diagnosis not present

## 2022-07-06 DIAGNOSIS — N2581 Secondary hyperparathyroidism of renal origin: Secondary | ICD-10-CM | POA: Diagnosis not present

## 2022-07-08 DIAGNOSIS — N186 End stage renal disease: Secondary | ICD-10-CM | POA: Diagnosis not present

## 2022-07-08 DIAGNOSIS — Z992 Dependence on renal dialysis: Secondary | ICD-10-CM | POA: Diagnosis not present

## 2022-07-08 DIAGNOSIS — N2581 Secondary hyperparathyroidism of renal origin: Secondary | ICD-10-CM | POA: Diagnosis not present

## 2022-07-10 DIAGNOSIS — Z992 Dependence on renal dialysis: Secondary | ICD-10-CM | POA: Diagnosis not present

## 2022-07-10 DIAGNOSIS — N186 End stage renal disease: Secondary | ICD-10-CM | POA: Diagnosis not present

## 2022-07-10 DIAGNOSIS — N2581 Secondary hyperparathyroidism of renal origin: Secondary | ICD-10-CM | POA: Diagnosis not present

## 2022-07-13 DIAGNOSIS — Z992 Dependence on renal dialysis: Secondary | ICD-10-CM | POA: Diagnosis not present

## 2022-07-13 DIAGNOSIS — N2581 Secondary hyperparathyroidism of renal origin: Secondary | ICD-10-CM | POA: Diagnosis not present

## 2022-07-13 DIAGNOSIS — N186 End stage renal disease: Secondary | ICD-10-CM | POA: Diagnosis not present

## 2022-07-15 DIAGNOSIS — Z992 Dependence on renal dialysis: Secondary | ICD-10-CM | POA: Diagnosis not present

## 2022-07-15 DIAGNOSIS — N186 End stage renal disease: Secondary | ICD-10-CM | POA: Diagnosis not present

## 2022-07-15 DIAGNOSIS — N2581 Secondary hyperparathyroidism of renal origin: Secondary | ICD-10-CM | POA: Diagnosis not present

## 2022-07-17 DIAGNOSIS — Z992 Dependence on renal dialysis: Secondary | ICD-10-CM | POA: Diagnosis not present

## 2022-07-17 DIAGNOSIS — N2581 Secondary hyperparathyroidism of renal origin: Secondary | ICD-10-CM | POA: Diagnosis not present

## 2022-07-17 DIAGNOSIS — N186 End stage renal disease: Secondary | ICD-10-CM | POA: Diagnosis not present

## 2022-07-20 DIAGNOSIS — N2581 Secondary hyperparathyroidism of renal origin: Secondary | ICD-10-CM | POA: Diagnosis not present

## 2022-07-20 DIAGNOSIS — Z992 Dependence on renal dialysis: Secondary | ICD-10-CM | POA: Diagnosis not present

## 2022-07-20 DIAGNOSIS — N186 End stage renal disease: Secondary | ICD-10-CM | POA: Diagnosis not present

## 2022-07-22 DIAGNOSIS — N2581 Secondary hyperparathyroidism of renal origin: Secondary | ICD-10-CM | POA: Diagnosis not present

## 2022-07-22 DIAGNOSIS — Z992 Dependence on renal dialysis: Secondary | ICD-10-CM | POA: Diagnosis not present

## 2022-07-22 DIAGNOSIS — N186 End stage renal disease: Secondary | ICD-10-CM | POA: Diagnosis not present

## 2022-07-24 DIAGNOSIS — Z992 Dependence on renal dialysis: Secondary | ICD-10-CM | POA: Diagnosis not present

## 2022-07-24 DIAGNOSIS — N2581 Secondary hyperparathyroidism of renal origin: Secondary | ICD-10-CM | POA: Diagnosis not present

## 2022-07-24 DIAGNOSIS — N186 End stage renal disease: Secondary | ICD-10-CM | POA: Diagnosis not present

## 2022-07-27 DIAGNOSIS — N2581 Secondary hyperparathyroidism of renal origin: Secondary | ICD-10-CM | POA: Diagnosis not present

## 2022-07-27 DIAGNOSIS — N186 End stage renal disease: Secondary | ICD-10-CM | POA: Diagnosis not present

## 2022-07-27 DIAGNOSIS — Z992 Dependence on renal dialysis: Secondary | ICD-10-CM | POA: Diagnosis not present

## 2022-07-29 DIAGNOSIS — N2581 Secondary hyperparathyroidism of renal origin: Secondary | ICD-10-CM | POA: Diagnosis not present

## 2022-07-29 DIAGNOSIS — Z992 Dependence on renal dialysis: Secondary | ICD-10-CM | POA: Diagnosis not present

## 2022-07-29 DIAGNOSIS — N186 End stage renal disease: Secondary | ICD-10-CM | POA: Diagnosis not present

## 2022-07-31 DIAGNOSIS — N2581 Secondary hyperparathyroidism of renal origin: Secondary | ICD-10-CM | POA: Diagnosis not present

## 2022-07-31 DIAGNOSIS — N186 End stage renal disease: Secondary | ICD-10-CM | POA: Diagnosis not present

## 2022-07-31 DIAGNOSIS — Z992 Dependence on renal dialysis: Secondary | ICD-10-CM | POA: Diagnosis not present

## 2022-08-03 DIAGNOSIS — N2581 Secondary hyperparathyroidism of renal origin: Secondary | ICD-10-CM | POA: Diagnosis not present

## 2022-08-03 DIAGNOSIS — N186 End stage renal disease: Secondary | ICD-10-CM | POA: Diagnosis not present

## 2022-08-03 DIAGNOSIS — Z992 Dependence on renal dialysis: Secondary | ICD-10-CM | POA: Diagnosis not present

## 2022-08-05 DIAGNOSIS — Z992 Dependence on renal dialysis: Secondary | ICD-10-CM | POA: Diagnosis not present

## 2022-08-05 DIAGNOSIS — N041 Nephrotic syndrome with focal and segmental glomerular lesions: Secondary | ICD-10-CM | POA: Diagnosis not present

## 2022-08-05 DIAGNOSIS — N2581 Secondary hyperparathyroidism of renal origin: Secondary | ICD-10-CM | POA: Diagnosis not present

## 2022-08-05 DIAGNOSIS — N186 End stage renal disease: Secondary | ICD-10-CM | POA: Diagnosis not present

## 2022-08-07 DIAGNOSIS — Z992 Dependence on renal dialysis: Secondary | ICD-10-CM | POA: Diagnosis not present

## 2022-08-07 DIAGNOSIS — N2581 Secondary hyperparathyroidism of renal origin: Secondary | ICD-10-CM | POA: Diagnosis not present

## 2022-08-07 DIAGNOSIS — N186 End stage renal disease: Secondary | ICD-10-CM | POA: Diagnosis not present

## 2022-08-10 DIAGNOSIS — N2581 Secondary hyperparathyroidism of renal origin: Secondary | ICD-10-CM | POA: Diagnosis not present

## 2022-08-10 DIAGNOSIS — Z992 Dependence on renal dialysis: Secondary | ICD-10-CM | POA: Diagnosis not present

## 2022-08-10 DIAGNOSIS — N186 End stage renal disease: Secondary | ICD-10-CM | POA: Diagnosis not present

## 2022-08-12 DIAGNOSIS — Z992 Dependence on renal dialysis: Secondary | ICD-10-CM | POA: Diagnosis not present

## 2022-08-12 DIAGNOSIS — N186 End stage renal disease: Secondary | ICD-10-CM | POA: Diagnosis not present

## 2022-08-12 DIAGNOSIS — N2581 Secondary hyperparathyroidism of renal origin: Secondary | ICD-10-CM | POA: Diagnosis not present

## 2022-08-14 DIAGNOSIS — Z992 Dependence on renal dialysis: Secondary | ICD-10-CM | POA: Diagnosis not present

## 2022-08-14 DIAGNOSIS — N2581 Secondary hyperparathyroidism of renal origin: Secondary | ICD-10-CM | POA: Diagnosis not present

## 2022-08-14 DIAGNOSIS — N186 End stage renal disease: Secondary | ICD-10-CM | POA: Diagnosis not present

## 2022-08-17 DIAGNOSIS — N186 End stage renal disease: Secondary | ICD-10-CM | POA: Diagnosis not present

## 2022-08-17 DIAGNOSIS — N2581 Secondary hyperparathyroidism of renal origin: Secondary | ICD-10-CM | POA: Diagnosis not present

## 2022-08-17 DIAGNOSIS — Z992 Dependence on renal dialysis: Secondary | ICD-10-CM | POA: Diagnosis not present

## 2022-08-19 DIAGNOSIS — N186 End stage renal disease: Secondary | ICD-10-CM | POA: Diagnosis not present

## 2022-08-19 DIAGNOSIS — N2581 Secondary hyperparathyroidism of renal origin: Secondary | ICD-10-CM | POA: Diagnosis not present

## 2022-08-19 DIAGNOSIS — Z992 Dependence on renal dialysis: Secondary | ICD-10-CM | POA: Diagnosis not present

## 2022-08-21 DIAGNOSIS — Z992 Dependence on renal dialysis: Secondary | ICD-10-CM | POA: Diagnosis not present

## 2022-08-21 DIAGNOSIS — N186 End stage renal disease: Secondary | ICD-10-CM | POA: Diagnosis not present

## 2022-08-21 DIAGNOSIS — N2581 Secondary hyperparathyroidism of renal origin: Secondary | ICD-10-CM | POA: Diagnosis not present

## 2022-08-23 ENCOUNTER — Telehealth: Payer: Self-pay | Admitting: *Deleted

## 2022-08-23 NOTE — Patient Outreach (Signed)
Care Coordination   08/23/2022 Name: James [name] MRN: [id number] DOB: [birth year]   Care Coordination Outreach Attempts:  An unsuccessful telephone outreach was attempted today to offer the patient information about available care coordination services as a benefit of their health plan.   Follow Up Plan:  Additional outreach attempts will be made to offer the patient care coordination information and services.   Encounter Outcome:  No Answer  Care Coordination Interventions Activated:  No   Care Coordination Interventions:  No, not indicated    Irving Shows [facility], BSN [facility] RN Care Coordinator [phone]

## 2022-08-24 ENCOUNTER — Telehealth: Payer: Self-pay | Admitting: *Deleted

## 2022-08-24 DIAGNOSIS — Z992 Dependence on renal dialysis: Secondary | ICD-10-CM | POA: Diagnosis not present

## 2022-08-24 DIAGNOSIS — N2581 Secondary hyperparathyroidism of renal origin: Secondary | ICD-10-CM | POA: Diagnosis not present

## 2022-08-24 DIAGNOSIS — N186 End stage renal disease: Secondary | ICD-10-CM | POA: Diagnosis not present

## 2022-08-24 NOTE — Patient Outreach (Signed)
Care Coordination   08/24/2022 Name: James [name] MRN: [id number] DOB: Jul 18, 1956   Care Coordination Outreach Attempts:  A second unsuccessful outreach was attempted today to offer the patient with information about available care coordination services as a benefit of their health plan.     Follow Up Plan:  Additional outreach attempts will be made to offer the patient care coordination information and services.   Encounter Outcome:  No Answer  Care Coordination Interventions Activated:  No   Care Coordination Interventions:  No, not indicated    Irving Shows [facility], BSN [facility] RN Care Coordinator [phone]

## 2022-08-25 ENCOUNTER — Telehealth: Payer: Self-pay | Admitting: *Deleted

## 2022-08-25 NOTE — Patient Outreach (Signed)
Care Coordination   08/25/2022 Name: James [name] MRN: [id number] DOB: 1956-07-18   Care Coordination Outreach Attempts:  A third unsuccessful outreach was attempted today to offer the patient with information about available care coordination services as a benefit of their health plan.   Follow Up Plan:  No further outreach attempts will be made at this time. We have been unable to contact the patient to offer or enroll patient in care coordination services  Encounter Outcome:  No Answer  Care Coordination Interventions Activated:  No   Care Coordination Interventions:  No, not indicated    Irving Shows [facility], BSN [facility] RN Care Coordinator [phone]

## 2022-08-26 DIAGNOSIS — N186 End stage renal disease: Secondary | ICD-10-CM | POA: Diagnosis not present

## 2022-08-26 DIAGNOSIS — Z992 Dependence on renal dialysis: Secondary | ICD-10-CM | POA: Diagnosis not present

## 2022-08-26 DIAGNOSIS — N2581 Secondary hyperparathyroidism of renal origin: Secondary | ICD-10-CM | POA: Diagnosis not present

## 2022-08-28 DIAGNOSIS — N186 End stage renal disease: Secondary | ICD-10-CM | POA: Diagnosis not present

## 2022-08-28 DIAGNOSIS — N2581 Secondary hyperparathyroidism of renal origin: Secondary | ICD-10-CM | POA: Diagnosis not present

## 2022-08-28 DIAGNOSIS — Z992 Dependence on renal dialysis: Secondary | ICD-10-CM | POA: Diagnosis not present

## 2022-08-31 DIAGNOSIS — N2581 Secondary hyperparathyroidism of renal origin: Secondary | ICD-10-CM | POA: Diagnosis not present

## 2022-08-31 DIAGNOSIS — N186 End stage renal disease: Secondary | ICD-10-CM | POA: Diagnosis not present

## 2022-08-31 DIAGNOSIS — Z992 Dependence on renal dialysis: Secondary | ICD-10-CM | POA: Diagnosis not present

## 2022-09-02 DIAGNOSIS — Z992 Dependence on renal dialysis: Secondary | ICD-10-CM | POA: Diagnosis not present

## 2022-09-02 DIAGNOSIS — N186 End stage renal disease: Secondary | ICD-10-CM | POA: Diagnosis not present

## 2022-09-02 DIAGNOSIS — N2581 Secondary hyperparathyroidism of renal origin: Secondary | ICD-10-CM | POA: Diagnosis not present

## 2022-09-04 DIAGNOSIS — N2581 Secondary hyperparathyroidism of renal origin: Secondary | ICD-10-CM | POA: Diagnosis not present

## 2022-09-04 DIAGNOSIS — N186 End stage renal disease: Secondary | ICD-10-CM | POA: Diagnosis not present

## 2022-09-04 DIAGNOSIS — N041 Nephrotic syndrome with focal and segmental glomerular lesions: Secondary | ICD-10-CM | POA: Diagnosis not present

## 2022-09-04 DIAGNOSIS — Z992 Dependence on renal dialysis: Secondary | ICD-10-CM | POA: Diagnosis not present

## 2022-09-07 DIAGNOSIS — N2581 Secondary hyperparathyroidism of renal origin: Secondary | ICD-10-CM | POA: Diagnosis not present

## 2022-09-07 DIAGNOSIS — N186 End stage renal disease: Secondary | ICD-10-CM | POA: Diagnosis not present

## 2022-09-07 DIAGNOSIS — Z992 Dependence on renal dialysis: Secondary | ICD-10-CM | POA: Diagnosis not present

## 2022-09-09 DIAGNOSIS — N2581 Secondary hyperparathyroidism of renal origin: Secondary | ICD-10-CM | POA: Diagnosis not present

## 2022-09-09 DIAGNOSIS — N186 End stage renal disease: Secondary | ICD-10-CM | POA: Diagnosis not present

## 2022-09-09 DIAGNOSIS — Z992 Dependence on renal dialysis: Secondary | ICD-10-CM | POA: Diagnosis not present

## 2022-09-11 DIAGNOSIS — N2581 Secondary hyperparathyroidism of renal origin: Secondary | ICD-10-CM | POA: Diagnosis not present

## 2022-09-11 DIAGNOSIS — N186 End stage renal disease: Secondary | ICD-10-CM | POA: Diagnosis not present

## 2022-09-11 DIAGNOSIS — Z992 Dependence on renal dialysis: Secondary | ICD-10-CM | POA: Diagnosis not present

## 2022-09-14 DIAGNOSIS — N186 End stage renal disease: Secondary | ICD-10-CM | POA: Diagnosis not present

## 2022-09-14 DIAGNOSIS — Z992 Dependence on renal dialysis: Secondary | ICD-10-CM | POA: Diagnosis not present

## 2022-09-14 DIAGNOSIS — N2581 Secondary hyperparathyroidism of renal origin: Secondary | ICD-10-CM | POA: Diagnosis not present

## 2022-09-16 DIAGNOSIS — Z992 Dependence on renal dialysis: Secondary | ICD-10-CM | POA: Diagnosis not present

## 2022-09-16 DIAGNOSIS — N2581 Secondary hyperparathyroidism of renal origin: Secondary | ICD-10-CM | POA: Diagnosis not present

## 2022-09-16 DIAGNOSIS — N186 End stage renal disease: Secondary | ICD-10-CM | POA: Diagnosis not present

## 2022-09-21 DIAGNOSIS — N2581 Secondary hyperparathyroidism of renal origin: Secondary | ICD-10-CM | POA: Diagnosis not present

## 2022-09-21 DIAGNOSIS — Z992 Dependence on renal dialysis: Secondary | ICD-10-CM | POA: Diagnosis not present

## 2022-09-21 DIAGNOSIS — N186 End stage renal disease: Secondary | ICD-10-CM | POA: Diagnosis not present

## 2022-09-22 DIAGNOSIS — N2581 Secondary hyperparathyroidism of renal origin: Secondary | ICD-10-CM | POA: Diagnosis not present

## 2022-09-22 DIAGNOSIS — N186 End stage renal disease: Secondary | ICD-10-CM | POA: Diagnosis not present

## 2022-09-22 DIAGNOSIS — Z992 Dependence on renal dialysis: Secondary | ICD-10-CM | POA: Diagnosis not present

## 2022-09-23 DIAGNOSIS — N186 End stage renal disease: Secondary | ICD-10-CM | POA: Diagnosis not present

## 2022-09-23 DIAGNOSIS — N2581 Secondary hyperparathyroidism of renal origin: Secondary | ICD-10-CM | POA: Diagnosis not present

## 2022-09-23 DIAGNOSIS — Z992 Dependence on renal dialysis: Secondary | ICD-10-CM | POA: Diagnosis not present

## 2022-09-25 DIAGNOSIS — Z992 Dependence on renal dialysis: Secondary | ICD-10-CM | POA: Diagnosis not present

## 2022-09-25 DIAGNOSIS — N2581 Secondary hyperparathyroidism of renal origin: Secondary | ICD-10-CM | POA: Diagnosis not present

## 2022-09-25 DIAGNOSIS — N186 End stage renal disease: Secondary | ICD-10-CM | POA: Diagnosis not present

## 2022-09-28 DIAGNOSIS — N186 End stage renal disease: Secondary | ICD-10-CM | POA: Diagnosis not present

## 2022-09-28 DIAGNOSIS — N2581 Secondary hyperparathyroidism of renal origin: Secondary | ICD-10-CM | POA: Diagnosis not present

## 2022-09-28 DIAGNOSIS — Z992 Dependence on renal dialysis: Secondary | ICD-10-CM | POA: Diagnosis not present

## 2022-09-30 DIAGNOSIS — N2581 Secondary hyperparathyroidism of renal origin: Secondary | ICD-10-CM | POA: Diagnosis not present

## 2022-09-30 DIAGNOSIS — Z992 Dependence on renal dialysis: Secondary | ICD-10-CM | POA: Diagnosis not present

## 2022-09-30 DIAGNOSIS — N186 End stage renal disease: Secondary | ICD-10-CM | POA: Diagnosis not present

## 2022-10-02 DIAGNOSIS — N2581 Secondary hyperparathyroidism of renal origin: Secondary | ICD-10-CM | POA: Diagnosis not present

## 2022-10-02 DIAGNOSIS — N186 End stage renal disease: Secondary | ICD-10-CM | POA: Diagnosis not present

## 2022-10-02 DIAGNOSIS — Z992 Dependence on renal dialysis: Secondary | ICD-10-CM | POA: Diagnosis not present

## 2022-10-05 DIAGNOSIS — N2581 Secondary hyperparathyroidism of renal origin: Secondary | ICD-10-CM | POA: Diagnosis not present

## 2022-10-05 DIAGNOSIS — N041 Nephrotic syndrome with focal and segmental glomerular lesions: Secondary | ICD-10-CM | POA: Diagnosis not present

## 2022-10-05 DIAGNOSIS — Z992 Dependence on renal dialysis: Secondary | ICD-10-CM | POA: Diagnosis not present

## 2022-10-05 DIAGNOSIS — N186 End stage renal disease: Secondary | ICD-10-CM | POA: Diagnosis not present

## 2022-10-07 DIAGNOSIS — Z992 Dependence on renal dialysis: Secondary | ICD-10-CM | POA: Diagnosis not present

## 2022-10-07 DIAGNOSIS — N2581 Secondary hyperparathyroidism of renal origin: Secondary | ICD-10-CM | POA: Diagnosis not present

## 2022-10-07 DIAGNOSIS — N186 End stage renal disease: Secondary | ICD-10-CM | POA: Diagnosis not present

## 2022-10-09 DIAGNOSIS — N2581 Secondary hyperparathyroidism of renal origin: Secondary | ICD-10-CM | POA: Diagnosis not present

## 2022-10-09 DIAGNOSIS — N186 End stage renal disease: Secondary | ICD-10-CM | POA: Diagnosis not present

## 2022-10-09 DIAGNOSIS — Z992 Dependence on renal dialysis: Secondary | ICD-10-CM | POA: Diagnosis not present

## 2022-10-12 DIAGNOSIS — Z992 Dependence on renal dialysis: Secondary | ICD-10-CM | POA: Diagnosis not present

## 2022-10-12 DIAGNOSIS — N2581 Secondary hyperparathyroidism of renal origin: Secondary | ICD-10-CM | POA: Diagnosis not present

## 2022-10-12 DIAGNOSIS — N186 End stage renal disease: Secondary | ICD-10-CM | POA: Diagnosis not present

## 2022-10-14 DIAGNOSIS — N2581 Secondary hyperparathyroidism of renal origin: Secondary | ICD-10-CM | POA: Diagnosis not present

## 2022-10-14 DIAGNOSIS — Z992 Dependence on renal dialysis: Secondary | ICD-10-CM | POA: Diagnosis not present

## 2022-10-14 DIAGNOSIS — N186 End stage renal disease: Secondary | ICD-10-CM | POA: Diagnosis not present

## 2022-10-16 DIAGNOSIS — Z992 Dependence on renal dialysis: Secondary | ICD-10-CM | POA: Diagnosis not present

## 2022-10-16 DIAGNOSIS — N186 End stage renal disease: Secondary | ICD-10-CM | POA: Diagnosis not present

## 2022-10-16 DIAGNOSIS — N2581 Secondary hyperparathyroidism of renal origin: Secondary | ICD-10-CM | POA: Diagnosis not present

## 2022-10-19 DIAGNOSIS — N2581 Secondary hyperparathyroidism of renal origin: Secondary | ICD-10-CM | POA: Diagnosis not present

## 2022-10-19 DIAGNOSIS — N186 End stage renal disease: Secondary | ICD-10-CM | POA: Diagnosis not present

## 2022-10-19 DIAGNOSIS — Z992 Dependence on renal dialysis: Secondary | ICD-10-CM | POA: Diagnosis not present

## 2022-10-21 DIAGNOSIS — N186 End stage renal disease: Secondary | ICD-10-CM | POA: Diagnosis not present

## 2022-10-21 DIAGNOSIS — Z992 Dependence on renal dialysis: Secondary | ICD-10-CM | POA: Diagnosis not present

## 2022-10-21 DIAGNOSIS — N2581 Secondary hyperparathyroidism of renal origin: Secondary | ICD-10-CM | POA: Diagnosis not present

## 2022-10-23 DIAGNOSIS — Z992 Dependence on renal dialysis: Secondary | ICD-10-CM | POA: Diagnosis not present

## 2022-10-23 DIAGNOSIS — N2581 Secondary hyperparathyroidism of renal origin: Secondary | ICD-10-CM | POA: Diagnosis not present

## 2022-10-23 DIAGNOSIS — N186 End stage renal disease: Secondary | ICD-10-CM | POA: Diagnosis not present

## 2022-10-25 DIAGNOSIS — N2581 Secondary hyperparathyroidism of renal origin: Secondary | ICD-10-CM | POA: Diagnosis not present

## 2022-10-25 DIAGNOSIS — Z992 Dependence on renal dialysis: Secondary | ICD-10-CM | POA: Diagnosis not present

## 2022-10-25 DIAGNOSIS — N186 End stage renal disease: Secondary | ICD-10-CM | POA: Diagnosis not present

## 2022-10-27 DIAGNOSIS — N2581 Secondary hyperparathyroidism of renal origin: Secondary | ICD-10-CM | POA: Diagnosis not present

## 2022-10-27 DIAGNOSIS — Z992 Dependence on renal dialysis: Secondary | ICD-10-CM | POA: Diagnosis not present

## 2022-10-27 DIAGNOSIS — N186 End stage renal disease: Secondary | ICD-10-CM | POA: Diagnosis not present

## 2022-10-30 DIAGNOSIS — N2581 Secondary hyperparathyroidism of renal origin: Secondary | ICD-10-CM | POA: Diagnosis not present

## 2022-10-30 DIAGNOSIS — Z992 Dependence on renal dialysis: Secondary | ICD-10-CM | POA: Diagnosis not present

## 2022-10-30 DIAGNOSIS — N186 End stage renal disease: Secondary | ICD-10-CM | POA: Diagnosis not present

## 2022-11-02 DIAGNOSIS — Z992 Dependence on renal dialysis: Secondary | ICD-10-CM | POA: Diagnosis not present

## 2022-11-02 DIAGNOSIS — N2581 Secondary hyperparathyroidism of renal origin: Secondary | ICD-10-CM | POA: Diagnosis not present

## 2022-11-02 DIAGNOSIS — N186 End stage renal disease: Secondary | ICD-10-CM | POA: Diagnosis not present

## 2022-11-04 DIAGNOSIS — Z992 Dependence on renal dialysis: Secondary | ICD-10-CM | POA: Diagnosis not present

## 2022-11-04 DIAGNOSIS — N2581 Secondary hyperparathyroidism of renal origin: Secondary | ICD-10-CM | POA: Diagnosis not present

## 2022-11-04 DIAGNOSIS — N186 End stage renal disease: Secondary | ICD-10-CM | POA: Diagnosis not present

## 2022-11-04 DIAGNOSIS — N041 Nephrotic syndrome with focal and segmental glomerular lesions: Secondary | ICD-10-CM | POA: Diagnosis not present

## 2022-11-06 DIAGNOSIS — N2581 Secondary hyperparathyroidism of renal origin: Secondary | ICD-10-CM | POA: Diagnosis not present

## 2022-11-06 DIAGNOSIS — N186 End stage renal disease: Secondary | ICD-10-CM | POA: Diagnosis not present

## 2022-11-06 DIAGNOSIS — Z992 Dependence on renal dialysis: Secondary | ICD-10-CM | POA: Diagnosis not present

## 2022-11-09 DIAGNOSIS — Z992 Dependence on renal dialysis: Secondary | ICD-10-CM | POA: Diagnosis not present

## 2022-11-09 DIAGNOSIS — N2581 Secondary hyperparathyroidism of renal origin: Secondary | ICD-10-CM | POA: Diagnosis not present

## 2022-11-09 DIAGNOSIS — N186 End stage renal disease: Secondary | ICD-10-CM | POA: Diagnosis not present

## 2022-11-11 DIAGNOSIS — N2581 Secondary hyperparathyroidism of renal origin: Secondary | ICD-10-CM | POA: Diagnosis not present

## 2022-11-11 DIAGNOSIS — N186 End stage renal disease: Secondary | ICD-10-CM | POA: Diagnosis not present

## 2022-11-11 DIAGNOSIS — Z992 Dependence on renal dialysis: Secondary | ICD-10-CM | POA: Diagnosis not present

## 2022-11-13 DIAGNOSIS — Z992 Dependence on renal dialysis: Secondary | ICD-10-CM | POA: Diagnosis not present

## 2022-11-13 DIAGNOSIS — N2581 Secondary hyperparathyroidism of renal origin: Secondary | ICD-10-CM | POA: Diagnosis not present

## 2022-11-13 DIAGNOSIS — N186 End stage renal disease: Secondary | ICD-10-CM | POA: Diagnosis not present

## 2022-11-16 DIAGNOSIS — N186 End stage renal disease: Secondary | ICD-10-CM | POA: Diagnosis not present

## 2022-11-16 DIAGNOSIS — Z992 Dependence on renal dialysis: Secondary | ICD-10-CM | POA: Diagnosis not present

## 2022-11-16 DIAGNOSIS — N2581 Secondary hyperparathyroidism of renal origin: Secondary | ICD-10-CM | POA: Diagnosis not present

## 2022-11-18 DIAGNOSIS — N186 End stage renal disease: Secondary | ICD-10-CM | POA: Diagnosis not present

## 2022-11-18 DIAGNOSIS — Z992 Dependence on renal dialysis: Secondary | ICD-10-CM | POA: Diagnosis not present

## 2022-11-18 DIAGNOSIS — N2581 Secondary hyperparathyroidism of renal origin: Secondary | ICD-10-CM | POA: Diagnosis not present

## 2022-11-20 DIAGNOSIS — N2581 Secondary hyperparathyroidism of renal origin: Secondary | ICD-10-CM | POA: Diagnosis not present

## 2022-11-20 DIAGNOSIS — Z992 Dependence on renal dialysis: Secondary | ICD-10-CM | POA: Diagnosis not present

## 2022-11-20 DIAGNOSIS — N186 End stage renal disease: Secondary | ICD-10-CM | POA: Diagnosis not present

## 2022-11-23 DIAGNOSIS — Z992 Dependence on renal dialysis: Secondary | ICD-10-CM | POA: Diagnosis not present

## 2022-11-23 DIAGNOSIS — N186 End stage renal disease: Secondary | ICD-10-CM | POA: Diagnosis not present

## 2022-11-23 DIAGNOSIS — N2581 Secondary hyperparathyroidism of renal origin: Secondary | ICD-10-CM | POA: Diagnosis not present

## 2022-11-25 DIAGNOSIS — N2581 Secondary hyperparathyroidism of renal origin: Secondary | ICD-10-CM | POA: Diagnosis not present

## 2022-11-25 DIAGNOSIS — N186 End stage renal disease: Secondary | ICD-10-CM | POA: Diagnosis not present

## 2022-11-25 DIAGNOSIS — Z992 Dependence on renal dialysis: Secondary | ICD-10-CM | POA: Diagnosis not present

## 2022-11-27 DIAGNOSIS — N2581 Secondary hyperparathyroidism of renal origin: Secondary | ICD-10-CM | POA: Diagnosis not present

## 2022-11-27 DIAGNOSIS — N186 End stage renal disease: Secondary | ICD-10-CM | POA: Diagnosis not present

## 2022-11-27 DIAGNOSIS — Z992 Dependence on renal dialysis: Secondary | ICD-10-CM | POA: Diagnosis not present

## 2022-11-30 DIAGNOSIS — N186 End stage renal disease: Secondary | ICD-10-CM | POA: Diagnosis not present

## 2022-11-30 DIAGNOSIS — Z992 Dependence on renal dialysis: Secondary | ICD-10-CM | POA: Diagnosis not present

## 2022-11-30 DIAGNOSIS — N2581 Secondary hyperparathyroidism of renal origin: Secondary | ICD-10-CM | POA: Diagnosis not present

## 2022-12-02 DIAGNOSIS — N2581 Secondary hyperparathyroidism of renal origin: Secondary | ICD-10-CM | POA: Diagnosis not present

## 2022-12-02 DIAGNOSIS — N186 End stage renal disease: Secondary | ICD-10-CM | POA: Diagnosis not present

## 2022-12-02 DIAGNOSIS — Z992 Dependence on renal dialysis: Secondary | ICD-10-CM | POA: Diagnosis not present

## 2022-12-04 DIAGNOSIS — N2581 Secondary hyperparathyroidism of renal origin: Secondary | ICD-10-CM | POA: Diagnosis not present

## 2022-12-04 DIAGNOSIS — N186 End stage renal disease: Secondary | ICD-10-CM | POA: Diagnosis not present

## 2022-12-04 DIAGNOSIS — Z992 Dependence on renal dialysis: Secondary | ICD-10-CM | POA: Diagnosis not present

## 2022-12-05 DIAGNOSIS — N041 Nephrotic syndrome with focal and segmental glomerular lesions: Secondary | ICD-10-CM | POA: Diagnosis not present

## 2022-12-05 DIAGNOSIS — N186 End stage renal disease: Secondary | ICD-10-CM | POA: Diagnosis not present

## 2022-12-05 DIAGNOSIS — Z992 Dependence on renal dialysis: Secondary | ICD-10-CM | POA: Diagnosis not present

## 2022-12-07 DIAGNOSIS — N2581 Secondary hyperparathyroidism of renal origin: Secondary | ICD-10-CM | POA: Diagnosis not present

## 2022-12-07 DIAGNOSIS — N186 End stage renal disease: Secondary | ICD-10-CM | POA: Diagnosis not present

## 2022-12-07 DIAGNOSIS — Z992 Dependence on renal dialysis: Secondary | ICD-10-CM | POA: Diagnosis not present

## 2022-12-09 DIAGNOSIS — N2581 Secondary hyperparathyroidism of renal origin: Secondary | ICD-10-CM | POA: Diagnosis not present

## 2022-12-09 DIAGNOSIS — N186 End stage renal disease: Secondary | ICD-10-CM | POA: Diagnosis not present

## 2022-12-09 DIAGNOSIS — Z992 Dependence on renal dialysis: Secondary | ICD-10-CM | POA: Diagnosis not present

## 2022-12-11 DIAGNOSIS — N186 End stage renal disease: Secondary | ICD-10-CM | POA: Diagnosis not present

## 2022-12-11 DIAGNOSIS — Z992 Dependence on renal dialysis: Secondary | ICD-10-CM | POA: Diagnosis not present

## 2022-12-11 DIAGNOSIS — N2581 Secondary hyperparathyroidism of renal origin: Secondary | ICD-10-CM | POA: Diagnosis not present

## 2022-12-14 DIAGNOSIS — Z992 Dependence on renal dialysis: Secondary | ICD-10-CM | POA: Diagnosis not present

## 2022-12-14 DIAGNOSIS — N2581 Secondary hyperparathyroidism of renal origin: Secondary | ICD-10-CM | POA: Diagnosis not present

## 2022-12-14 DIAGNOSIS — N186 End stage renal disease: Secondary | ICD-10-CM | POA: Diagnosis not present

## 2022-12-16 DIAGNOSIS — Z992 Dependence on renal dialysis: Secondary | ICD-10-CM | POA: Diagnosis not present

## 2022-12-16 DIAGNOSIS — N186 End stage renal disease: Secondary | ICD-10-CM | POA: Diagnosis not present

## 2022-12-16 DIAGNOSIS — N2581 Secondary hyperparathyroidism of renal origin: Secondary | ICD-10-CM | POA: Diagnosis not present

## 2022-12-18 DIAGNOSIS — Z992 Dependence on renal dialysis: Secondary | ICD-10-CM | POA: Diagnosis not present

## 2022-12-18 DIAGNOSIS — N186 End stage renal disease: Secondary | ICD-10-CM | POA: Diagnosis not present

## 2022-12-18 DIAGNOSIS — N2581 Secondary hyperparathyroidism of renal origin: Secondary | ICD-10-CM | POA: Diagnosis not present

## 2022-12-21 DIAGNOSIS — N2581 Secondary hyperparathyroidism of renal origin: Secondary | ICD-10-CM | POA: Diagnosis not present

## 2022-12-21 DIAGNOSIS — Z992 Dependence on renal dialysis: Secondary | ICD-10-CM | POA: Diagnosis not present

## 2022-12-21 DIAGNOSIS — N186 End stage renal disease: Secondary | ICD-10-CM | POA: Diagnosis not present

## 2022-12-23 DIAGNOSIS — Z992 Dependence on renal dialysis: Secondary | ICD-10-CM | POA: Diagnosis not present

## 2022-12-23 DIAGNOSIS — N186 End stage renal disease: Secondary | ICD-10-CM | POA: Diagnosis not present

## 2022-12-23 DIAGNOSIS — N2581 Secondary hyperparathyroidism of renal origin: Secondary | ICD-10-CM | POA: Diagnosis not present

## 2022-12-25 DIAGNOSIS — Z992 Dependence on renal dialysis: Secondary | ICD-10-CM | POA: Diagnosis not present

## 2022-12-25 DIAGNOSIS — N186 End stage renal disease: Secondary | ICD-10-CM | POA: Diagnosis not present

## 2022-12-25 DIAGNOSIS — N2581 Secondary hyperparathyroidism of renal origin: Secondary | ICD-10-CM | POA: Diagnosis not present

## 2022-12-28 DIAGNOSIS — Z992 Dependence on renal dialysis: Secondary | ICD-10-CM | POA: Diagnosis not present

## 2022-12-28 DIAGNOSIS — N186 End stage renal disease: Secondary | ICD-10-CM | POA: Diagnosis not present

## 2022-12-28 DIAGNOSIS — N2581 Secondary hyperparathyroidism of renal origin: Secondary | ICD-10-CM | POA: Diagnosis not present

## 2022-12-30 DIAGNOSIS — Z992 Dependence on renal dialysis: Secondary | ICD-10-CM | POA: Diagnosis not present

## 2022-12-30 DIAGNOSIS — N186 End stage renal disease: Secondary | ICD-10-CM | POA: Diagnosis not present

## 2022-12-30 DIAGNOSIS — N2581 Secondary hyperparathyroidism of renal origin: Secondary | ICD-10-CM | POA: Diagnosis not present

## 2023-01-01 DIAGNOSIS — N2581 Secondary hyperparathyroidism of renal origin: Secondary | ICD-10-CM | POA: Diagnosis not present

## 2023-01-01 DIAGNOSIS — N186 End stage renal disease: Secondary | ICD-10-CM | POA: Diagnosis not present

## 2023-01-01 DIAGNOSIS — Z992 Dependence on renal dialysis: Secondary | ICD-10-CM | POA: Diagnosis not present

## 2023-01-04 DIAGNOSIS — N186 End stage renal disease: Secondary | ICD-10-CM | POA: Diagnosis not present

## 2023-01-04 DIAGNOSIS — Z992 Dependence on renal dialysis: Secondary | ICD-10-CM | POA: Diagnosis not present

## 2023-01-04 DIAGNOSIS — N2581 Secondary hyperparathyroidism of renal origin: Secondary | ICD-10-CM | POA: Diagnosis not present

## 2023-01-05 DIAGNOSIS — N041 Nephrotic syndrome with focal and segmental glomerular lesions: Secondary | ICD-10-CM | POA: Diagnosis not present

## 2023-01-05 DIAGNOSIS — Z992 Dependence on renal dialysis: Secondary | ICD-10-CM | POA: Diagnosis not present

## 2023-01-05 DIAGNOSIS — N186 End stage renal disease: Secondary | ICD-10-CM | POA: Diagnosis not present

## 2023-01-06 DIAGNOSIS — N186 End stage renal disease: Secondary | ICD-10-CM | POA: Diagnosis not present

## 2023-01-06 DIAGNOSIS — N2581 Secondary hyperparathyroidism of renal origin: Secondary | ICD-10-CM | POA: Diagnosis not present

## 2023-01-06 DIAGNOSIS — Z992 Dependence on renal dialysis: Secondary | ICD-10-CM | POA: Diagnosis not present

## 2023-01-08 DIAGNOSIS — Z992 Dependence on renal dialysis: Secondary | ICD-10-CM | POA: Diagnosis not present

## 2023-01-08 DIAGNOSIS — N2581 Secondary hyperparathyroidism of renal origin: Secondary | ICD-10-CM | POA: Diagnosis not present

## 2023-01-08 DIAGNOSIS — N186 End stage renal disease: Secondary | ICD-10-CM | POA: Diagnosis not present

## 2023-01-11 DIAGNOSIS — Z992 Dependence on renal dialysis: Secondary | ICD-10-CM | POA: Diagnosis not present

## 2023-01-11 DIAGNOSIS — N186 End stage renal disease: Secondary | ICD-10-CM | POA: Diagnosis not present

## 2023-01-11 DIAGNOSIS — N2581 Secondary hyperparathyroidism of renal origin: Secondary | ICD-10-CM | POA: Diagnosis not present

## 2023-01-13 DIAGNOSIS — Z992 Dependence on renal dialysis: Secondary | ICD-10-CM | POA: Diagnosis not present

## 2023-01-13 DIAGNOSIS — N2581 Secondary hyperparathyroidism of renal origin: Secondary | ICD-10-CM | POA: Diagnosis not present

## 2023-01-13 DIAGNOSIS — N186 End stage renal disease: Secondary | ICD-10-CM | POA: Diagnosis not present

## 2023-01-15 DIAGNOSIS — N2581 Secondary hyperparathyroidism of renal origin: Secondary | ICD-10-CM | POA: Diagnosis not present

## 2023-01-15 DIAGNOSIS — Z992 Dependence on renal dialysis: Secondary | ICD-10-CM | POA: Diagnosis not present

## 2023-01-15 DIAGNOSIS — N186 End stage renal disease: Secondary | ICD-10-CM | POA: Diagnosis not present

## 2023-01-18 DIAGNOSIS — N186 End stage renal disease: Secondary | ICD-10-CM | POA: Diagnosis not present

## 2023-01-18 DIAGNOSIS — N2581 Secondary hyperparathyroidism of renal origin: Secondary | ICD-10-CM | POA: Diagnosis not present

## 2023-01-18 DIAGNOSIS — Z992 Dependence on renal dialysis: Secondary | ICD-10-CM | POA: Diagnosis not present

## 2023-01-20 DIAGNOSIS — N186 End stage renal disease: Secondary | ICD-10-CM | POA: Diagnosis not present

## 2023-01-20 DIAGNOSIS — N2581 Secondary hyperparathyroidism of renal origin: Secondary | ICD-10-CM | POA: Diagnosis not present

## 2023-01-20 DIAGNOSIS — Z992 Dependence on renal dialysis: Secondary | ICD-10-CM | POA: Diagnosis not present

## 2023-01-22 DIAGNOSIS — N2581 Secondary hyperparathyroidism of renal origin: Secondary | ICD-10-CM | POA: Diagnosis not present

## 2023-01-22 DIAGNOSIS — Z992 Dependence on renal dialysis: Secondary | ICD-10-CM | POA: Diagnosis not present

## 2023-01-22 DIAGNOSIS — N186 End stage renal disease: Secondary | ICD-10-CM | POA: Diagnosis not present

## 2023-01-25 DIAGNOSIS — N2581 Secondary hyperparathyroidism of renal origin: Secondary | ICD-10-CM | POA: Diagnosis not present

## 2023-01-25 DIAGNOSIS — N186 End stage renal disease: Secondary | ICD-10-CM | POA: Diagnosis not present

## 2023-01-25 DIAGNOSIS — Z992 Dependence on renal dialysis: Secondary | ICD-10-CM | POA: Diagnosis not present

## 2023-01-27 DIAGNOSIS — N2581 Secondary hyperparathyroidism of renal origin: Secondary | ICD-10-CM | POA: Diagnosis not present

## 2023-01-27 DIAGNOSIS — Z992 Dependence on renal dialysis: Secondary | ICD-10-CM | POA: Diagnosis not present

## 2023-01-27 DIAGNOSIS — N186 End stage renal disease: Secondary | ICD-10-CM | POA: Diagnosis not present

## 2023-01-29 DIAGNOSIS — N2581 Secondary hyperparathyroidism of renal origin: Secondary | ICD-10-CM | POA: Diagnosis not present

## 2023-01-29 DIAGNOSIS — N186 End stage renal disease: Secondary | ICD-10-CM | POA: Diagnosis not present

## 2023-01-29 DIAGNOSIS — Z992 Dependence on renal dialysis: Secondary | ICD-10-CM | POA: Diagnosis not present

## 2023-01-30 DIAGNOSIS — N186 End stage renal disease: Secondary | ICD-10-CM | POA: Diagnosis not present

## 2023-02-01 DIAGNOSIS — N2581 Secondary hyperparathyroidism of renal origin: Secondary | ICD-10-CM | POA: Diagnosis not present

## 2023-02-01 DIAGNOSIS — N186 End stage renal disease: Secondary | ICD-10-CM | POA: Diagnosis not present

## 2023-02-01 DIAGNOSIS — Z992 Dependence on renal dialysis: Secondary | ICD-10-CM | POA: Diagnosis not present

## 2023-02-03 DIAGNOSIS — Z992 Dependence on renal dialysis: Secondary | ICD-10-CM | POA: Diagnosis not present

## 2023-02-03 DIAGNOSIS — N2581 Secondary hyperparathyroidism of renal origin: Secondary | ICD-10-CM | POA: Diagnosis not present

## 2023-02-03 DIAGNOSIS — N186 End stage renal disease: Secondary | ICD-10-CM | POA: Diagnosis not present

## 2023-02-03 DIAGNOSIS — N041 Nephrotic syndrome with focal and segmental glomerular lesions: Secondary | ICD-10-CM | POA: Diagnosis not present

## 2023-02-05 DIAGNOSIS — Z992 Dependence on renal dialysis: Secondary | ICD-10-CM | POA: Diagnosis not present

## 2023-02-05 DIAGNOSIS — N186 End stage renal disease: Secondary | ICD-10-CM | POA: Diagnosis not present

## 2023-02-05 DIAGNOSIS — N2581 Secondary hyperparathyroidism of renal origin: Secondary | ICD-10-CM | POA: Diagnosis not present

## 2023-02-08 DIAGNOSIS — N2581 Secondary hyperparathyroidism of renal origin: Secondary | ICD-10-CM | POA: Diagnosis not present

## 2023-02-08 DIAGNOSIS — N186 End stage renal disease: Secondary | ICD-10-CM | POA: Diagnosis not present

## 2023-02-08 DIAGNOSIS — Z992 Dependence on renal dialysis: Secondary | ICD-10-CM | POA: Diagnosis not present

## 2023-02-10 DIAGNOSIS — N2581 Secondary hyperparathyroidism of renal origin: Secondary | ICD-10-CM | POA: Diagnosis not present

## 2023-02-10 DIAGNOSIS — N186 End stage renal disease: Secondary | ICD-10-CM | POA: Diagnosis not present

## 2023-02-10 DIAGNOSIS — Z992 Dependence on renal dialysis: Secondary | ICD-10-CM | POA: Diagnosis not present

## 2023-02-12 DIAGNOSIS — Z992 Dependence on renal dialysis: Secondary | ICD-10-CM | POA: Diagnosis not present

## 2023-02-12 DIAGNOSIS — N186 End stage renal disease: Secondary | ICD-10-CM | POA: Diagnosis not present

## 2023-02-12 DIAGNOSIS — N2581 Secondary hyperparathyroidism of renal origin: Secondary | ICD-10-CM | POA: Diagnosis not present

## 2023-02-15 DIAGNOSIS — N186 End stage renal disease: Secondary | ICD-10-CM | POA: Diagnosis not present

## 2023-02-15 DIAGNOSIS — Z992 Dependence on renal dialysis: Secondary | ICD-10-CM | POA: Diagnosis not present

## 2023-02-15 DIAGNOSIS — N2581 Secondary hyperparathyroidism of renal origin: Secondary | ICD-10-CM | POA: Diagnosis not present

## 2023-02-17 DIAGNOSIS — Z992 Dependence on renal dialysis: Secondary | ICD-10-CM | POA: Diagnosis not present

## 2023-02-17 DIAGNOSIS — N186 End stage renal disease: Secondary | ICD-10-CM | POA: Diagnosis not present

## 2023-02-17 DIAGNOSIS — N2581 Secondary hyperparathyroidism of renal origin: Secondary | ICD-10-CM | POA: Diagnosis not present

## 2023-02-19 DIAGNOSIS — Z992 Dependence on renal dialysis: Secondary | ICD-10-CM | POA: Diagnosis not present

## 2023-02-19 DIAGNOSIS — N2581 Secondary hyperparathyroidism of renal origin: Secondary | ICD-10-CM | POA: Diagnosis not present

## 2023-02-19 DIAGNOSIS — N186 End stage renal disease: Secondary | ICD-10-CM | POA: Diagnosis not present

## 2023-02-22 DIAGNOSIS — N186 End stage renal disease: Secondary | ICD-10-CM | POA: Diagnosis not present

## 2023-02-22 DIAGNOSIS — Z992 Dependence on renal dialysis: Secondary | ICD-10-CM | POA: Diagnosis not present

## 2023-02-22 DIAGNOSIS — N2581 Secondary hyperparathyroidism of renal origin: Secondary | ICD-10-CM | POA: Diagnosis not present

## 2023-02-24 DIAGNOSIS — N2581 Secondary hyperparathyroidism of renal origin: Secondary | ICD-10-CM | POA: Diagnosis not present

## 2023-02-24 DIAGNOSIS — Z992 Dependence on renal dialysis: Secondary | ICD-10-CM | POA: Diagnosis not present

## 2023-02-24 DIAGNOSIS — N186 End stage renal disease: Secondary | ICD-10-CM | POA: Diagnosis not present

## 2023-02-26 DIAGNOSIS — Z992 Dependence on renal dialysis: Secondary | ICD-10-CM | POA: Diagnosis not present

## 2023-02-26 DIAGNOSIS — N2581 Secondary hyperparathyroidism of renal origin: Secondary | ICD-10-CM | POA: Diagnosis not present

## 2023-02-26 DIAGNOSIS — N186 End stage renal disease: Secondary | ICD-10-CM | POA: Diagnosis not present

## 2023-03-01 DIAGNOSIS — Z992 Dependence on renal dialysis: Secondary | ICD-10-CM | POA: Diagnosis not present

## 2023-03-01 DIAGNOSIS — N2581 Secondary hyperparathyroidism of renal origin: Secondary | ICD-10-CM | POA: Diagnosis not present

## 2023-03-01 DIAGNOSIS — N186 End stage renal disease: Secondary | ICD-10-CM | POA: Diagnosis not present

## 2023-03-03 DIAGNOSIS — N186 End stage renal disease: Secondary | ICD-10-CM | POA: Diagnosis not present

## 2023-03-03 DIAGNOSIS — Z992 Dependence on renal dialysis: Secondary | ICD-10-CM | POA: Diagnosis not present

## 2023-03-03 DIAGNOSIS — N2581 Secondary hyperparathyroidism of renal origin: Secondary | ICD-10-CM | POA: Diagnosis not present

## 2023-03-05 DIAGNOSIS — N2581 Secondary hyperparathyroidism of renal origin: Secondary | ICD-10-CM | POA: Diagnosis not present

## 2023-03-05 DIAGNOSIS — N186 End stage renal disease: Secondary | ICD-10-CM | POA: Diagnosis not present

## 2023-03-05 DIAGNOSIS — Z992 Dependence on renal dialysis: Secondary | ICD-10-CM | POA: Diagnosis not present

## 2023-03-06 DIAGNOSIS — Z992 Dependence on renal dialysis: Secondary | ICD-10-CM | POA: Diagnosis not present

## 2023-03-06 DIAGNOSIS — N186 End stage renal disease: Secondary | ICD-10-CM | POA: Diagnosis not present

## 2023-03-06 DIAGNOSIS — N041 Nephrotic syndrome with focal and segmental glomerular lesions: Secondary | ICD-10-CM | POA: Diagnosis not present

## 2023-03-08 DIAGNOSIS — N186 End stage renal disease: Secondary | ICD-10-CM | POA: Diagnosis not present

## 2023-03-08 DIAGNOSIS — Z992 Dependence on renal dialysis: Secondary | ICD-10-CM | POA: Diagnosis not present

## 2023-03-08 DIAGNOSIS — N2581 Secondary hyperparathyroidism of renal origin: Secondary | ICD-10-CM | POA: Diagnosis not present

## 2023-03-10 DIAGNOSIS — N186 End stage renal disease: Secondary | ICD-10-CM | POA: Diagnosis not present

## 2023-03-10 DIAGNOSIS — N2581 Secondary hyperparathyroidism of renal origin: Secondary | ICD-10-CM | POA: Diagnosis not present

## 2023-03-10 DIAGNOSIS — Z992 Dependence on renal dialysis: Secondary | ICD-10-CM | POA: Diagnosis not present

## 2023-03-12 DIAGNOSIS — Z992 Dependence on renal dialysis: Secondary | ICD-10-CM | POA: Diagnosis not present

## 2023-03-12 DIAGNOSIS — N2581 Secondary hyperparathyroidism of renal origin: Secondary | ICD-10-CM | POA: Diagnosis not present

## 2023-03-12 DIAGNOSIS — N186 End stage renal disease: Secondary | ICD-10-CM | POA: Diagnosis not present

## 2023-03-15 DIAGNOSIS — N2581 Secondary hyperparathyroidism of renal origin: Secondary | ICD-10-CM | POA: Diagnosis not present

## 2023-03-15 DIAGNOSIS — N186 End stage renal disease: Secondary | ICD-10-CM | POA: Diagnosis not present

## 2023-03-15 DIAGNOSIS — Z992 Dependence on renal dialysis: Secondary | ICD-10-CM | POA: Diagnosis not present

## 2023-03-17 DIAGNOSIS — N186 End stage renal disease: Secondary | ICD-10-CM | POA: Diagnosis not present

## 2023-03-17 DIAGNOSIS — N2581 Secondary hyperparathyroidism of renal origin: Secondary | ICD-10-CM | POA: Diagnosis not present

## 2023-03-17 DIAGNOSIS — Z992 Dependence on renal dialysis: Secondary | ICD-10-CM | POA: Diagnosis not present

## 2023-03-19 DIAGNOSIS — N2581 Secondary hyperparathyroidism of renal origin: Secondary | ICD-10-CM | POA: Diagnosis not present

## 2023-03-19 DIAGNOSIS — Z992 Dependence on renal dialysis: Secondary | ICD-10-CM | POA: Diagnosis not present

## 2023-03-19 DIAGNOSIS — N186 End stage renal disease: Secondary | ICD-10-CM | POA: Diagnosis not present

## 2023-03-22 DIAGNOSIS — N2581 Secondary hyperparathyroidism of renal origin: Secondary | ICD-10-CM | POA: Diagnosis not present

## 2023-03-22 DIAGNOSIS — Z992 Dependence on renal dialysis: Secondary | ICD-10-CM | POA: Diagnosis not present

## 2023-03-22 DIAGNOSIS — N186 End stage renal disease: Secondary | ICD-10-CM | POA: Diagnosis not present

## 2023-03-24 DIAGNOSIS — N186 End stage renal disease: Secondary | ICD-10-CM | POA: Diagnosis not present

## 2023-03-24 DIAGNOSIS — N2581 Secondary hyperparathyroidism of renal origin: Secondary | ICD-10-CM | POA: Diagnosis not present

## 2023-03-24 DIAGNOSIS — Z992 Dependence on renal dialysis: Secondary | ICD-10-CM | POA: Diagnosis not present

## 2023-03-26 DIAGNOSIS — N186 End stage renal disease: Secondary | ICD-10-CM | POA: Diagnosis not present

## 2023-03-26 DIAGNOSIS — N2581 Secondary hyperparathyroidism of renal origin: Secondary | ICD-10-CM | POA: Diagnosis not present

## 2023-03-26 DIAGNOSIS — Z992 Dependence on renal dialysis: Secondary | ICD-10-CM | POA: Diagnosis not present

## 2023-03-29 DIAGNOSIS — N2581 Secondary hyperparathyroidism of renal origin: Secondary | ICD-10-CM | POA: Diagnosis not present

## 2023-03-29 DIAGNOSIS — N186 End stage renal disease: Secondary | ICD-10-CM | POA: Diagnosis not present

## 2023-03-29 DIAGNOSIS — Z992 Dependence on renal dialysis: Secondary | ICD-10-CM | POA: Diagnosis not present

## 2023-03-31 DIAGNOSIS — N2581 Secondary hyperparathyroidism of renal origin: Secondary | ICD-10-CM | POA: Diagnosis not present

## 2023-03-31 DIAGNOSIS — N186 End stage renal disease: Secondary | ICD-10-CM | POA: Diagnosis not present

## 2023-03-31 DIAGNOSIS — Z992 Dependence on renal dialysis: Secondary | ICD-10-CM | POA: Diagnosis not present

## 2023-04-02 DIAGNOSIS — N2581 Secondary hyperparathyroidism of renal origin: Secondary | ICD-10-CM | POA: Diagnosis not present

## 2023-04-02 DIAGNOSIS — N186 End stage renal disease: Secondary | ICD-10-CM | POA: Diagnosis not present

## 2023-04-02 DIAGNOSIS — Z992 Dependence on renal dialysis: Secondary | ICD-10-CM | POA: Diagnosis not present

## 2023-04-05 DIAGNOSIS — N041 Nephrotic syndrome with focal and segmental glomerular lesions: Secondary | ICD-10-CM | POA: Diagnosis not present

## 2023-04-05 DIAGNOSIS — N2581 Secondary hyperparathyroidism of renal origin: Secondary | ICD-10-CM | POA: Diagnosis not present

## 2023-04-05 DIAGNOSIS — N186 End stage renal disease: Secondary | ICD-10-CM | POA: Diagnosis not present

## 2023-04-05 DIAGNOSIS — Z992 Dependence on renal dialysis: Secondary | ICD-10-CM | POA: Diagnosis not present

## 2023-04-07 DIAGNOSIS — Z992 Dependence on renal dialysis: Secondary | ICD-10-CM | POA: Diagnosis not present

## 2023-04-07 DIAGNOSIS — N186 End stage renal disease: Secondary | ICD-10-CM | POA: Diagnosis not present

## 2023-04-07 DIAGNOSIS — N2581 Secondary hyperparathyroidism of renal origin: Secondary | ICD-10-CM | POA: Diagnosis not present

## 2023-04-09 DIAGNOSIS — Z992 Dependence on renal dialysis: Secondary | ICD-10-CM | POA: Diagnosis not present

## 2023-04-09 DIAGNOSIS — N2581 Secondary hyperparathyroidism of renal origin: Secondary | ICD-10-CM | POA: Diagnosis not present

## 2023-04-09 DIAGNOSIS — N186 End stage renal disease: Secondary | ICD-10-CM | POA: Diagnosis not present

## 2023-04-12 DIAGNOSIS — N2581 Secondary hyperparathyroidism of renal origin: Secondary | ICD-10-CM | POA: Diagnosis not present

## 2023-04-12 DIAGNOSIS — N186 End stage renal disease: Secondary | ICD-10-CM | POA: Diagnosis not present

## 2023-04-12 DIAGNOSIS — Z992 Dependence on renal dialysis: Secondary | ICD-10-CM | POA: Diagnosis not present

## 2023-04-14 DIAGNOSIS — Z992 Dependence on renal dialysis: Secondary | ICD-10-CM | POA: Diagnosis not present

## 2023-04-14 DIAGNOSIS — N2581 Secondary hyperparathyroidism of renal origin: Secondary | ICD-10-CM | POA: Diagnosis not present

## 2023-04-14 DIAGNOSIS — N186 End stage renal disease: Secondary | ICD-10-CM | POA: Diagnosis not present

## 2023-04-16 DIAGNOSIS — N186 End stage renal disease: Secondary | ICD-10-CM | POA: Diagnosis not present

## 2023-04-16 DIAGNOSIS — N2581 Secondary hyperparathyroidism of renal origin: Secondary | ICD-10-CM | POA: Diagnosis not present

## 2023-04-16 DIAGNOSIS — Z992 Dependence on renal dialysis: Secondary | ICD-10-CM | POA: Diagnosis not present

## 2023-04-19 DIAGNOSIS — Z992 Dependence on renal dialysis: Secondary | ICD-10-CM | POA: Diagnosis not present

## 2023-04-19 DIAGNOSIS — N186 End stage renal disease: Secondary | ICD-10-CM | POA: Diagnosis not present

## 2023-04-19 DIAGNOSIS — N2581 Secondary hyperparathyroidism of renal origin: Secondary | ICD-10-CM | POA: Diagnosis not present

## 2023-04-21 DIAGNOSIS — Z992 Dependence on renal dialysis: Secondary | ICD-10-CM | POA: Diagnosis not present

## 2023-04-21 DIAGNOSIS — N2581 Secondary hyperparathyroidism of renal origin: Secondary | ICD-10-CM | POA: Diagnosis not present

## 2023-04-21 DIAGNOSIS — N186 End stage renal disease: Secondary | ICD-10-CM | POA: Diagnosis not present

## 2023-04-23 DIAGNOSIS — Z992 Dependence on renal dialysis: Secondary | ICD-10-CM | POA: Diagnosis not present

## 2023-04-23 DIAGNOSIS — N2581 Secondary hyperparathyroidism of renal origin: Secondary | ICD-10-CM | POA: Diagnosis not present

## 2023-04-23 DIAGNOSIS — N186 End stage renal disease: Secondary | ICD-10-CM | POA: Diagnosis not present

## 2023-04-26 DIAGNOSIS — Z992 Dependence on renal dialysis: Secondary | ICD-10-CM | POA: Diagnosis not present

## 2023-04-26 DIAGNOSIS — N186 End stage renal disease: Secondary | ICD-10-CM | POA: Diagnosis not present

## 2023-04-26 DIAGNOSIS — N2581 Secondary hyperparathyroidism of renal origin: Secondary | ICD-10-CM | POA: Diagnosis not present

## 2023-04-28 DIAGNOSIS — N2581 Secondary hyperparathyroidism of renal origin: Secondary | ICD-10-CM | POA: Diagnosis not present

## 2023-04-28 DIAGNOSIS — Z992 Dependence on renal dialysis: Secondary | ICD-10-CM | POA: Diagnosis not present

## 2023-04-28 DIAGNOSIS — N186 End stage renal disease: Secondary | ICD-10-CM | POA: Diagnosis not present

## 2023-04-30 DIAGNOSIS — N186 End stage renal disease: Secondary | ICD-10-CM | POA: Diagnosis not present

## 2023-04-30 DIAGNOSIS — Z992 Dependence on renal dialysis: Secondary | ICD-10-CM | POA: Diagnosis not present

## 2023-04-30 DIAGNOSIS — N2581 Secondary hyperparathyroidism of renal origin: Secondary | ICD-10-CM | POA: Diagnosis not present

## 2023-05-03 DIAGNOSIS — N2581 Secondary hyperparathyroidism of renal origin: Secondary | ICD-10-CM | POA: Diagnosis not present

## 2023-05-03 DIAGNOSIS — Z992 Dependence on renal dialysis: Secondary | ICD-10-CM | POA: Diagnosis not present

## 2023-05-03 DIAGNOSIS — N186 End stage renal disease: Secondary | ICD-10-CM | POA: Diagnosis not present

## 2023-05-05 DIAGNOSIS — N186 End stage renal disease: Secondary | ICD-10-CM | POA: Diagnosis not present

## 2023-05-05 DIAGNOSIS — Z992 Dependence on renal dialysis: Secondary | ICD-10-CM | POA: Diagnosis not present

## 2023-05-05 DIAGNOSIS — N2581 Secondary hyperparathyroidism of renal origin: Secondary | ICD-10-CM | POA: Diagnosis not present

## 2023-05-06 DIAGNOSIS — Z992 Dependence on renal dialysis: Secondary | ICD-10-CM | POA: Diagnosis not present

## 2023-05-06 DIAGNOSIS — N186 End stage renal disease: Secondary | ICD-10-CM | POA: Diagnosis not present

## 2023-05-06 DIAGNOSIS — N041 Nephrotic syndrome with focal and segmental glomerular lesions: Secondary | ICD-10-CM | POA: Diagnosis not present

## 2023-05-07 DIAGNOSIS — N186 End stage renal disease: Secondary | ICD-10-CM | POA: Diagnosis not present

## 2023-05-07 DIAGNOSIS — N2581 Secondary hyperparathyroidism of renal origin: Secondary | ICD-10-CM | POA: Diagnosis not present

## 2023-05-07 DIAGNOSIS — Z992 Dependence on renal dialysis: Secondary | ICD-10-CM | POA: Diagnosis not present

## 2023-05-10 DIAGNOSIS — Z992 Dependence on renal dialysis: Secondary | ICD-10-CM | POA: Diagnosis not present

## 2023-05-10 DIAGNOSIS — N2581 Secondary hyperparathyroidism of renal origin: Secondary | ICD-10-CM | POA: Diagnosis not present

## 2023-05-10 DIAGNOSIS — N186 End stage renal disease: Secondary | ICD-10-CM | POA: Diagnosis not present

## 2023-05-12 DIAGNOSIS — Z992 Dependence on renal dialysis: Secondary | ICD-10-CM | POA: Diagnosis not present

## 2023-05-12 DIAGNOSIS — N186 End stage renal disease: Secondary | ICD-10-CM | POA: Diagnosis not present

## 2023-05-12 DIAGNOSIS — N2581 Secondary hyperparathyroidism of renal origin: Secondary | ICD-10-CM | POA: Diagnosis not present

## 2023-05-14 DIAGNOSIS — N186 End stage renal disease: Secondary | ICD-10-CM | POA: Diagnosis not present

## 2023-05-14 DIAGNOSIS — N2581 Secondary hyperparathyroidism of renal origin: Secondary | ICD-10-CM | POA: Diagnosis not present

## 2023-05-14 DIAGNOSIS — Z992 Dependence on renal dialysis: Secondary | ICD-10-CM | POA: Diagnosis not present

## 2023-05-17 DIAGNOSIS — N2581 Secondary hyperparathyroidism of renal origin: Secondary | ICD-10-CM | POA: Diagnosis not present

## 2023-05-17 DIAGNOSIS — N186 End stage renal disease: Secondary | ICD-10-CM | POA: Diagnosis not present

## 2023-05-17 DIAGNOSIS — Z992 Dependence on renal dialysis: Secondary | ICD-10-CM | POA: Diagnosis not present

## 2023-05-19 DIAGNOSIS — Z0189 Encounter for other specified special examinations: Secondary | ICD-10-CM | POA: Diagnosis not present

## 2023-05-19 DIAGNOSIS — L574 Cutis laxa senilis: Secondary | ICD-10-CM | POA: Diagnosis not present

## 2023-05-19 DIAGNOSIS — Z992 Dependence on renal dialysis: Secondary | ICD-10-CM | POA: Diagnosis not present

## 2023-05-19 DIAGNOSIS — M79671 Pain in right foot: Secondary | ICD-10-CM | POA: Diagnosis not present

## 2023-05-19 DIAGNOSIS — N186 End stage renal disease: Secondary | ICD-10-CM | POA: Diagnosis not present

## 2023-05-19 DIAGNOSIS — N2581 Secondary hyperparathyroidism of renal origin: Secondary | ICD-10-CM | POA: Diagnosis not present

## 2023-05-21 DIAGNOSIS — N186 End stage renal disease: Secondary | ICD-10-CM | POA: Diagnosis not present

## 2023-05-21 DIAGNOSIS — N2581 Secondary hyperparathyroidism of renal origin: Secondary | ICD-10-CM | POA: Diagnosis not present

## 2023-05-21 DIAGNOSIS — Z992 Dependence on renal dialysis: Secondary | ICD-10-CM | POA: Diagnosis not present

## 2023-05-24 DIAGNOSIS — N2581 Secondary hyperparathyroidism of renal origin: Secondary | ICD-10-CM | POA: Diagnosis not present

## 2023-05-24 DIAGNOSIS — Z992 Dependence on renal dialysis: Secondary | ICD-10-CM | POA: Diagnosis not present

## 2023-05-24 DIAGNOSIS — N186 End stage renal disease: Secondary | ICD-10-CM | POA: Diagnosis not present

## 2023-05-26 DIAGNOSIS — Z992 Dependence on renal dialysis: Secondary | ICD-10-CM | POA: Diagnosis not present

## 2023-05-26 DIAGNOSIS — N186 End stage renal disease: Secondary | ICD-10-CM | POA: Diagnosis not present

## 2023-05-26 DIAGNOSIS — N2581 Secondary hyperparathyroidism of renal origin: Secondary | ICD-10-CM | POA: Diagnosis not present

## 2023-05-28 DIAGNOSIS — Z992 Dependence on renal dialysis: Secondary | ICD-10-CM | POA: Diagnosis not present

## 2023-05-28 DIAGNOSIS — N186 End stage renal disease: Secondary | ICD-10-CM | POA: Diagnosis not present

## 2023-05-28 DIAGNOSIS — N2581 Secondary hyperparathyroidism of renal origin: Secondary | ICD-10-CM | POA: Diagnosis not present

## 2023-05-30 DIAGNOSIS — N186 End stage renal disease: Secondary | ICD-10-CM | POA: Diagnosis not present

## 2023-05-31 DIAGNOSIS — Z992 Dependence on renal dialysis: Secondary | ICD-10-CM | POA: Diagnosis not present

## 2023-05-31 DIAGNOSIS — N186 End stage renal disease: Secondary | ICD-10-CM | POA: Diagnosis not present

## 2023-05-31 DIAGNOSIS — N2581 Secondary hyperparathyroidism of renal origin: Secondary | ICD-10-CM | POA: Diagnosis not present

## 2023-06-02 DIAGNOSIS — Z992 Dependence on renal dialysis: Secondary | ICD-10-CM | POA: Diagnosis not present

## 2023-06-02 DIAGNOSIS — N186 End stage renal disease: Secondary | ICD-10-CM | POA: Diagnosis not present

## 2023-06-02 DIAGNOSIS — L574 Cutis laxa senilis: Secondary | ICD-10-CM | POA: Diagnosis not present

## 2023-06-02 DIAGNOSIS — N2581 Secondary hyperparathyroidism of renal origin: Secondary | ICD-10-CM | POA: Diagnosis not present

## 2023-06-04 DIAGNOSIS — N186 End stage renal disease: Secondary | ICD-10-CM | POA: Diagnosis not present

## 2023-06-04 DIAGNOSIS — Z992 Dependence on renal dialysis: Secondary | ICD-10-CM | POA: Diagnosis not present

## 2023-06-04 DIAGNOSIS — N2581 Secondary hyperparathyroidism of renal origin: Secondary | ICD-10-CM | POA: Diagnosis not present

## 2023-06-05 DIAGNOSIS — N186 End stage renal disease: Secondary | ICD-10-CM | POA: Diagnosis not present

## 2023-06-05 DIAGNOSIS — N041 Nephrotic syndrome with focal and segmental glomerular lesions: Secondary | ICD-10-CM | POA: Diagnosis not present

## 2023-06-05 DIAGNOSIS — Z992 Dependence on renal dialysis: Secondary | ICD-10-CM | POA: Diagnosis not present

## 2023-06-07 DIAGNOSIS — N2581 Secondary hyperparathyroidism of renal origin: Secondary | ICD-10-CM | POA: Diagnosis not present

## 2023-06-07 DIAGNOSIS — Z992 Dependence on renal dialysis: Secondary | ICD-10-CM | POA: Diagnosis not present

## 2023-06-07 DIAGNOSIS — N186 End stage renal disease: Secondary | ICD-10-CM | POA: Diagnosis not present

## 2023-06-11 DIAGNOSIS — Z992 Dependence on renal dialysis: Secondary | ICD-10-CM | POA: Diagnosis not present

## 2023-06-11 DIAGNOSIS — N2581 Secondary hyperparathyroidism of renal origin: Secondary | ICD-10-CM | POA: Diagnosis not present

## 2023-06-11 DIAGNOSIS — N186 End stage renal disease: Secondary | ICD-10-CM | POA: Diagnosis not present

## 2023-06-14 DIAGNOSIS — N2581 Secondary hyperparathyroidism of renal origin: Secondary | ICD-10-CM | POA: Diagnosis not present

## 2023-06-14 DIAGNOSIS — Z992 Dependence on renal dialysis: Secondary | ICD-10-CM | POA: Diagnosis not present

## 2023-06-14 DIAGNOSIS — N186 End stage renal disease: Secondary | ICD-10-CM | POA: Diagnosis not present

## 2023-06-14 DIAGNOSIS — L97412 Non-pressure chronic ulcer of right heel and midfoot with fat layer exposed: Secondary | ICD-10-CM | POA: Diagnosis not present

## 2023-06-16 DIAGNOSIS — N2581 Secondary hyperparathyroidism of renal origin: Secondary | ICD-10-CM | POA: Diagnosis not present

## 2023-06-16 DIAGNOSIS — Z992 Dependence on renal dialysis: Secondary | ICD-10-CM | POA: Diagnosis not present

## 2023-06-16 DIAGNOSIS — N186 End stage renal disease: Secondary | ICD-10-CM | POA: Diagnosis not present

## 2023-06-18 DIAGNOSIS — N2581 Secondary hyperparathyroidism of renal origin: Secondary | ICD-10-CM | POA: Diagnosis not present

## 2023-06-18 DIAGNOSIS — Z992 Dependence on renal dialysis: Secondary | ICD-10-CM | POA: Diagnosis not present

## 2023-06-18 DIAGNOSIS — N186 End stage renal disease: Secondary | ICD-10-CM | POA: Diagnosis not present

## 2023-06-21 DIAGNOSIS — N2581 Secondary hyperparathyroidism of renal origin: Secondary | ICD-10-CM | POA: Diagnosis not present

## 2023-06-21 DIAGNOSIS — N186 End stage renal disease: Secondary | ICD-10-CM | POA: Diagnosis not present

## 2023-06-21 DIAGNOSIS — Z992 Dependence on renal dialysis: Secondary | ICD-10-CM | POA: Diagnosis not present

## 2023-06-23 DIAGNOSIS — N2581 Secondary hyperparathyroidism of renal origin: Secondary | ICD-10-CM | POA: Diagnosis not present

## 2023-06-23 DIAGNOSIS — Z992 Dependence on renal dialysis: Secondary | ICD-10-CM | POA: Diagnosis not present

## 2023-06-23 DIAGNOSIS — N186 End stage renal disease: Secondary | ICD-10-CM | POA: Diagnosis not present

## 2023-06-24 DIAGNOSIS — N186 End stage renal disease: Secondary | ICD-10-CM | POA: Diagnosis not present

## 2023-06-24 DIAGNOSIS — Z992 Dependence on renal dialysis: Secondary | ICD-10-CM | POA: Diagnosis not present

## 2023-06-24 DIAGNOSIS — N2581 Secondary hyperparathyroidism of renal origin: Secondary | ICD-10-CM | POA: Diagnosis not present

## 2023-06-25 DIAGNOSIS — N2581 Secondary hyperparathyroidism of renal origin: Secondary | ICD-10-CM | POA: Diagnosis not present

## 2023-06-25 DIAGNOSIS — N186 End stage renal disease: Secondary | ICD-10-CM | POA: Diagnosis not present

## 2023-06-25 DIAGNOSIS — Z992 Dependence on renal dialysis: Secondary | ICD-10-CM | POA: Diagnosis not present

## 2023-06-28 DIAGNOSIS — N2581 Secondary hyperparathyroidism of renal origin: Secondary | ICD-10-CM | POA: Diagnosis not present

## 2023-06-28 DIAGNOSIS — N186 End stage renal disease: Secondary | ICD-10-CM | POA: Diagnosis not present

## 2023-06-28 DIAGNOSIS — Z992 Dependence on renal dialysis: Secondary | ICD-10-CM | POA: Diagnosis not present

## 2023-06-30 DIAGNOSIS — N186 End stage renal disease: Secondary | ICD-10-CM | POA: Diagnosis not present

## 2023-06-30 DIAGNOSIS — N2581 Secondary hyperparathyroidism of renal origin: Secondary | ICD-10-CM | POA: Diagnosis not present

## 2023-06-30 DIAGNOSIS — Z992 Dependence on renal dialysis: Secondary | ICD-10-CM | POA: Diagnosis not present

## 2023-07-02 DIAGNOSIS — N2581 Secondary hyperparathyroidism of renal origin: Secondary | ICD-10-CM | POA: Diagnosis not present

## 2023-07-02 DIAGNOSIS — N186 End stage renal disease: Secondary | ICD-10-CM | POA: Diagnosis not present

## 2023-07-02 DIAGNOSIS — Z992 Dependence on renal dialysis: Secondary | ICD-10-CM | POA: Diagnosis not present

## 2023-07-05 DIAGNOSIS — N2581 Secondary hyperparathyroidism of renal origin: Secondary | ICD-10-CM | POA: Diagnosis not present

## 2023-07-05 DIAGNOSIS — N186 End stage renal disease: Secondary | ICD-10-CM | POA: Diagnosis not present

## 2023-07-05 DIAGNOSIS — Z992 Dependence on renal dialysis: Secondary | ICD-10-CM | POA: Diagnosis not present

## 2023-07-06 DIAGNOSIS — N186 End stage renal disease: Secondary | ICD-10-CM | POA: Diagnosis not present

## 2023-07-06 DIAGNOSIS — N041 Nephrotic syndrome with focal and segmental glomerular lesions: Secondary | ICD-10-CM | POA: Diagnosis not present

## 2023-07-06 DIAGNOSIS — Z992 Dependence on renal dialysis: Secondary | ICD-10-CM | POA: Diagnosis not present

## 2023-07-07 DIAGNOSIS — N186 End stage renal disease: Secondary | ICD-10-CM | POA: Diagnosis not present

## 2023-07-07 DIAGNOSIS — E8779 Other fluid overload: Secondary | ICD-10-CM | POA: Diagnosis not present

## 2023-07-07 DIAGNOSIS — N2581 Secondary hyperparathyroidism of renal origin: Secondary | ICD-10-CM | POA: Diagnosis not present

## 2023-07-07 DIAGNOSIS — Z992 Dependence on renal dialysis: Secondary | ICD-10-CM | POA: Diagnosis not present

## 2023-07-09 DIAGNOSIS — Z992 Dependence on renal dialysis: Secondary | ICD-10-CM | POA: Diagnosis not present

## 2023-07-09 DIAGNOSIS — N2581 Secondary hyperparathyroidism of renal origin: Secondary | ICD-10-CM | POA: Diagnosis not present

## 2023-07-09 DIAGNOSIS — N186 End stage renal disease: Secondary | ICD-10-CM | POA: Diagnosis not present

## 2023-07-09 DIAGNOSIS — E8779 Other fluid overload: Secondary | ICD-10-CM | POA: Diagnosis not present

## 2023-07-12 DIAGNOSIS — N186 End stage renal disease: Secondary | ICD-10-CM | POA: Diagnosis not present

## 2023-07-12 DIAGNOSIS — Z992 Dependence on renal dialysis: Secondary | ICD-10-CM | POA: Diagnosis not present

## 2023-07-12 DIAGNOSIS — N2581 Secondary hyperparathyroidism of renal origin: Secondary | ICD-10-CM | POA: Diagnosis not present

## 2023-07-12 DIAGNOSIS — E8779 Other fluid overload: Secondary | ICD-10-CM | POA: Diagnosis not present

## 2023-07-14 DIAGNOSIS — N2581 Secondary hyperparathyroidism of renal origin: Secondary | ICD-10-CM | POA: Diagnosis not present

## 2023-07-14 DIAGNOSIS — E8779 Other fluid overload: Secondary | ICD-10-CM | POA: Diagnosis not present

## 2023-07-14 DIAGNOSIS — Z992 Dependence on renal dialysis: Secondary | ICD-10-CM | POA: Diagnosis not present

## 2023-07-14 DIAGNOSIS — N186 End stage renal disease: Secondary | ICD-10-CM | POA: Diagnosis not present

## 2023-07-16 DIAGNOSIS — N186 End stage renal disease: Secondary | ICD-10-CM | POA: Diagnosis not present

## 2023-07-16 DIAGNOSIS — N2581 Secondary hyperparathyroidism of renal origin: Secondary | ICD-10-CM | POA: Diagnosis not present

## 2023-07-16 DIAGNOSIS — E8779 Other fluid overload: Secondary | ICD-10-CM | POA: Diagnosis not present

## 2023-07-16 DIAGNOSIS — Z992 Dependence on renal dialysis: Secondary | ICD-10-CM | POA: Diagnosis not present

## 2023-07-19 DIAGNOSIS — E8779 Other fluid overload: Secondary | ICD-10-CM | POA: Diagnosis not present

## 2023-07-19 DIAGNOSIS — N2581 Secondary hyperparathyroidism of renal origin: Secondary | ICD-10-CM | POA: Diagnosis not present

## 2023-07-19 DIAGNOSIS — N186 End stage renal disease: Secondary | ICD-10-CM | POA: Diagnosis not present

## 2023-07-19 DIAGNOSIS — Z992 Dependence on renal dialysis: Secondary | ICD-10-CM | POA: Diagnosis not present

## 2023-07-21 DIAGNOSIS — N2581 Secondary hyperparathyroidism of renal origin: Secondary | ICD-10-CM | POA: Diagnosis not present

## 2023-07-21 DIAGNOSIS — Z992 Dependence on renal dialysis: Secondary | ICD-10-CM | POA: Diagnosis not present

## 2023-07-21 DIAGNOSIS — E8779 Other fluid overload: Secondary | ICD-10-CM | POA: Diagnosis not present

## 2023-07-21 DIAGNOSIS — N186 End stage renal disease: Secondary | ICD-10-CM | POA: Diagnosis not present

## 2023-07-23 DIAGNOSIS — E8779 Other fluid overload: Secondary | ICD-10-CM | POA: Diagnosis not present

## 2023-07-23 DIAGNOSIS — Z992 Dependence on renal dialysis: Secondary | ICD-10-CM | POA: Diagnosis not present

## 2023-07-23 DIAGNOSIS — N2581 Secondary hyperparathyroidism of renal origin: Secondary | ICD-10-CM | POA: Diagnosis not present

## 2023-07-23 DIAGNOSIS — N186 End stage renal disease: Secondary | ICD-10-CM | POA: Diagnosis not present

## 2023-07-26 DIAGNOSIS — N186 End stage renal disease: Secondary | ICD-10-CM | POA: Diagnosis not present

## 2023-07-26 DIAGNOSIS — E8779 Other fluid overload: Secondary | ICD-10-CM | POA: Diagnosis not present

## 2023-07-26 DIAGNOSIS — N2581 Secondary hyperparathyroidism of renal origin: Secondary | ICD-10-CM | POA: Diagnosis not present

## 2023-07-26 DIAGNOSIS — Z992 Dependence on renal dialysis: Secondary | ICD-10-CM | POA: Diagnosis not present

## 2023-07-28 DIAGNOSIS — Z992 Dependence on renal dialysis: Secondary | ICD-10-CM | POA: Diagnosis not present

## 2023-07-28 DIAGNOSIS — N186 End stage renal disease: Secondary | ICD-10-CM | POA: Diagnosis not present

## 2023-07-28 DIAGNOSIS — E8779 Other fluid overload: Secondary | ICD-10-CM | POA: Diagnosis not present

## 2023-07-28 DIAGNOSIS — N2581 Secondary hyperparathyroidism of renal origin: Secondary | ICD-10-CM | POA: Diagnosis not present

## 2023-07-29 DIAGNOSIS — E8779 Other fluid overload: Secondary | ICD-10-CM | POA: Diagnosis not present

## 2023-07-29 DIAGNOSIS — N186 End stage renal disease: Secondary | ICD-10-CM | POA: Diagnosis not present

## 2023-07-29 DIAGNOSIS — Z992 Dependence on renal dialysis: Secondary | ICD-10-CM | POA: Diagnosis not present

## 2023-07-29 DIAGNOSIS — N2581 Secondary hyperparathyroidism of renal origin: Secondary | ICD-10-CM | POA: Diagnosis not present

## 2023-07-30 DIAGNOSIS — N2581 Secondary hyperparathyroidism of renal origin: Secondary | ICD-10-CM | POA: Diagnosis not present

## 2023-07-30 DIAGNOSIS — E8779 Other fluid overload: Secondary | ICD-10-CM | POA: Diagnosis not present

## 2023-07-30 DIAGNOSIS — Z992 Dependence on renal dialysis: Secondary | ICD-10-CM | POA: Diagnosis not present

## 2023-07-30 DIAGNOSIS — N186 End stage renal disease: Secondary | ICD-10-CM | POA: Diagnosis not present

## 2023-08-02 DIAGNOSIS — E8779 Other fluid overload: Secondary | ICD-10-CM | POA: Diagnosis not present

## 2023-08-02 DIAGNOSIS — N186 End stage renal disease: Secondary | ICD-10-CM | POA: Diagnosis not present

## 2023-08-02 DIAGNOSIS — N2581 Secondary hyperparathyroidism of renal origin: Secondary | ICD-10-CM | POA: Diagnosis not present

## 2023-08-02 DIAGNOSIS — Z992 Dependence on renal dialysis: Secondary | ICD-10-CM | POA: Diagnosis not present

## 2023-08-04 DIAGNOSIS — E8779 Other fluid overload: Secondary | ICD-10-CM | POA: Diagnosis not present

## 2023-08-04 DIAGNOSIS — N186 End stage renal disease: Secondary | ICD-10-CM | POA: Diagnosis not present

## 2023-08-04 DIAGNOSIS — N2581 Secondary hyperparathyroidism of renal origin: Secondary | ICD-10-CM | POA: Diagnosis not present

## 2023-08-04 DIAGNOSIS — Z992 Dependence on renal dialysis: Secondary | ICD-10-CM | POA: Diagnosis not present

## 2023-08-06 DIAGNOSIS — N041 Nephrotic syndrome with focal and segmental glomerular lesions: Secondary | ICD-10-CM | POA: Diagnosis not present

## 2023-08-06 DIAGNOSIS — Z992 Dependence on renal dialysis: Secondary | ICD-10-CM | POA: Diagnosis not present

## 2023-08-06 DIAGNOSIS — N186 End stage renal disease: Secondary | ICD-10-CM | POA: Diagnosis not present

## 2023-08-06 DIAGNOSIS — N2581 Secondary hyperparathyroidism of renal origin: Secondary | ICD-10-CM | POA: Diagnosis not present

## 2023-08-06 DIAGNOSIS — E8779 Other fluid overload: Secondary | ICD-10-CM | POA: Diagnosis not present

## 2023-08-09 DIAGNOSIS — N2581 Secondary hyperparathyroidism of renal origin: Secondary | ICD-10-CM | POA: Diagnosis not present

## 2023-08-09 DIAGNOSIS — N186 End stage renal disease: Secondary | ICD-10-CM | POA: Diagnosis not present

## 2023-08-09 DIAGNOSIS — Z992 Dependence on renal dialysis: Secondary | ICD-10-CM | POA: Diagnosis not present

## 2023-08-11 DIAGNOSIS — N186 End stage renal disease: Secondary | ICD-10-CM | POA: Diagnosis not present

## 2023-08-11 DIAGNOSIS — N2581 Secondary hyperparathyroidism of renal origin: Secondary | ICD-10-CM | POA: Diagnosis not present

## 2023-08-11 DIAGNOSIS — Z992 Dependence on renal dialysis: Secondary | ICD-10-CM | POA: Diagnosis not present

## 2023-08-13 DIAGNOSIS — N2581 Secondary hyperparathyroidism of renal origin: Secondary | ICD-10-CM | POA: Diagnosis not present

## 2023-08-13 DIAGNOSIS — N186 End stage renal disease: Secondary | ICD-10-CM | POA: Diagnosis not present

## 2023-08-13 DIAGNOSIS — Z992 Dependence on renal dialysis: Secondary | ICD-10-CM | POA: Diagnosis not present

## 2023-08-16 DIAGNOSIS — N2581 Secondary hyperparathyroidism of renal origin: Secondary | ICD-10-CM | POA: Diagnosis not present

## 2023-08-16 DIAGNOSIS — N186 End stage renal disease: Secondary | ICD-10-CM | POA: Diagnosis not present

## 2023-08-16 DIAGNOSIS — Z992 Dependence on renal dialysis: Secondary | ICD-10-CM | POA: Diagnosis not present

## 2023-08-18 DIAGNOSIS — N2581 Secondary hyperparathyroidism of renal origin: Secondary | ICD-10-CM | POA: Diagnosis not present

## 2023-08-18 DIAGNOSIS — N186 End stage renal disease: Secondary | ICD-10-CM | POA: Diagnosis not present

## 2023-08-18 DIAGNOSIS — Z992 Dependence on renal dialysis: Secondary | ICD-10-CM | POA: Diagnosis not present

## 2023-08-20 DIAGNOSIS — Z992 Dependence on renal dialysis: Secondary | ICD-10-CM | POA: Diagnosis not present

## 2023-08-20 DIAGNOSIS — N186 End stage renal disease: Secondary | ICD-10-CM | POA: Diagnosis not present

## 2023-08-20 DIAGNOSIS — N2581 Secondary hyperparathyroidism of renal origin: Secondary | ICD-10-CM | POA: Diagnosis not present

## 2023-08-23 DIAGNOSIS — Z992 Dependence on renal dialysis: Secondary | ICD-10-CM | POA: Diagnosis not present

## 2023-08-23 DIAGNOSIS — N2581 Secondary hyperparathyroidism of renal origin: Secondary | ICD-10-CM | POA: Diagnosis not present

## 2023-08-23 DIAGNOSIS — N186 End stage renal disease: Secondary | ICD-10-CM | POA: Diagnosis not present

## 2023-08-25 DIAGNOSIS — Z992 Dependence on renal dialysis: Secondary | ICD-10-CM | POA: Diagnosis not present

## 2023-08-25 DIAGNOSIS — N2581 Secondary hyperparathyroidism of renal origin: Secondary | ICD-10-CM | POA: Diagnosis not present

## 2023-08-25 DIAGNOSIS — N186 End stage renal disease: Secondary | ICD-10-CM | POA: Diagnosis not present

## 2023-08-27 DIAGNOSIS — N2581 Secondary hyperparathyroidism of renal origin: Secondary | ICD-10-CM | POA: Diagnosis not present

## 2023-08-27 DIAGNOSIS — N186 End stage renal disease: Secondary | ICD-10-CM | POA: Diagnosis not present

## 2023-08-27 DIAGNOSIS — Z992 Dependence on renal dialysis: Secondary | ICD-10-CM | POA: Diagnosis not present

## 2023-08-30 DIAGNOSIS — N186 End stage renal disease: Secondary | ICD-10-CM | POA: Diagnosis not present

## 2023-08-30 DIAGNOSIS — Z992 Dependence on renal dialysis: Secondary | ICD-10-CM | POA: Diagnosis not present

## 2023-08-30 DIAGNOSIS — N2581 Secondary hyperparathyroidism of renal origin: Secondary | ICD-10-CM | POA: Diagnosis not present

## 2023-09-01 DIAGNOSIS — N186 End stage renal disease: Secondary | ICD-10-CM | POA: Diagnosis not present

## 2023-09-01 DIAGNOSIS — N2581 Secondary hyperparathyroidism of renal origin: Secondary | ICD-10-CM | POA: Diagnosis not present

## 2023-09-01 DIAGNOSIS — Z992 Dependence on renal dialysis: Secondary | ICD-10-CM | POA: Diagnosis not present

## 2023-09-03 DIAGNOSIS — N2581 Secondary hyperparathyroidism of renal origin: Secondary | ICD-10-CM | POA: Diagnosis not present

## 2023-09-03 DIAGNOSIS — N186 End stage renal disease: Secondary | ICD-10-CM | POA: Diagnosis not present

## 2023-09-03 DIAGNOSIS — Z992 Dependence on renal dialysis: Secondary | ICD-10-CM | POA: Diagnosis not present

## 2023-09-05 DIAGNOSIS — N041 Nephrotic syndrome with focal and segmental glomerular lesions: Secondary | ICD-10-CM | POA: Diagnosis not present

## 2023-09-05 DIAGNOSIS — N186 End stage renal disease: Secondary | ICD-10-CM | POA: Diagnosis not present

## 2023-09-05 DIAGNOSIS — Z992 Dependence on renal dialysis: Secondary | ICD-10-CM | POA: Diagnosis not present

## 2023-09-06 DIAGNOSIS — Z992 Dependence on renal dialysis: Secondary | ICD-10-CM | POA: Diagnosis not present

## 2023-09-06 DIAGNOSIS — N186 End stage renal disease: Secondary | ICD-10-CM | POA: Diagnosis not present

## 2023-09-06 DIAGNOSIS — N2581 Secondary hyperparathyroidism of renal origin: Secondary | ICD-10-CM | POA: Diagnosis not present

## 2023-09-08 DIAGNOSIS — N2581 Secondary hyperparathyroidism of renal origin: Secondary | ICD-10-CM | POA: Diagnosis not present

## 2023-09-08 DIAGNOSIS — N186 End stage renal disease: Secondary | ICD-10-CM | POA: Diagnosis not present

## 2023-09-08 DIAGNOSIS — Z992 Dependence on renal dialysis: Secondary | ICD-10-CM | POA: Diagnosis not present

## 2023-09-10 DIAGNOSIS — N186 End stage renal disease: Secondary | ICD-10-CM | POA: Diagnosis not present

## 2023-09-10 DIAGNOSIS — Z992 Dependence on renal dialysis: Secondary | ICD-10-CM | POA: Diagnosis not present

## 2023-09-10 DIAGNOSIS — N2581 Secondary hyperparathyroidism of renal origin: Secondary | ICD-10-CM | POA: Diagnosis not present

## 2023-09-15 DIAGNOSIS — N2581 Secondary hyperparathyroidism of renal origin: Secondary | ICD-10-CM | POA: Diagnosis not present

## 2023-09-15 DIAGNOSIS — N186 End stage renal disease: Secondary | ICD-10-CM | POA: Diagnosis not present

## 2023-09-15 DIAGNOSIS — Z992 Dependence on renal dialysis: Secondary | ICD-10-CM | POA: Diagnosis not present

## 2023-09-17 DIAGNOSIS — Z992 Dependence on renal dialysis: Secondary | ICD-10-CM | POA: Diagnosis not present

## 2023-09-17 DIAGNOSIS — N186 End stage renal disease: Secondary | ICD-10-CM | POA: Diagnosis not present

## 2023-09-17 DIAGNOSIS — N2581 Secondary hyperparathyroidism of renal origin: Secondary | ICD-10-CM | POA: Diagnosis not present

## 2023-09-20 DIAGNOSIS — N186 End stage renal disease: Secondary | ICD-10-CM | POA: Diagnosis not present

## 2023-09-20 DIAGNOSIS — N2581 Secondary hyperparathyroidism of renal origin: Secondary | ICD-10-CM | POA: Diagnosis not present

## 2023-09-20 DIAGNOSIS — Z992 Dependence on renal dialysis: Secondary | ICD-10-CM | POA: Diagnosis not present

## 2023-09-22 DIAGNOSIS — N2581 Secondary hyperparathyroidism of renal origin: Secondary | ICD-10-CM | POA: Diagnosis not present

## 2023-09-22 DIAGNOSIS — N186 End stage renal disease: Secondary | ICD-10-CM | POA: Diagnosis not present

## 2023-09-22 DIAGNOSIS — Z992 Dependence on renal dialysis: Secondary | ICD-10-CM | POA: Diagnosis not present

## 2023-09-24 DIAGNOSIS — N186 End stage renal disease: Secondary | ICD-10-CM | POA: Diagnosis not present

## 2023-09-24 DIAGNOSIS — Z992 Dependence on renal dialysis: Secondary | ICD-10-CM | POA: Diagnosis not present

## 2023-09-24 DIAGNOSIS — N2581 Secondary hyperparathyroidism of renal origin: Secondary | ICD-10-CM | POA: Diagnosis not present

## 2023-09-26 DIAGNOSIS — N186 End stage renal disease: Secondary | ICD-10-CM | POA: Diagnosis not present

## 2023-09-26 DIAGNOSIS — Z992 Dependence on renal dialysis: Secondary | ICD-10-CM | POA: Diagnosis not present

## 2023-09-26 DIAGNOSIS — N2581 Secondary hyperparathyroidism of renal origin: Secondary | ICD-10-CM | POA: Diagnosis not present

## 2023-09-27 DIAGNOSIS — N2581 Secondary hyperparathyroidism of renal origin: Secondary | ICD-10-CM | POA: Diagnosis not present

## 2023-09-27 DIAGNOSIS — Z992 Dependence on renal dialysis: Secondary | ICD-10-CM | POA: Diagnosis not present

## 2023-09-27 DIAGNOSIS — N186 End stage renal disease: Secondary | ICD-10-CM | POA: Diagnosis not present

## 2023-09-29 DIAGNOSIS — N2581 Secondary hyperparathyroidism of renal origin: Secondary | ICD-10-CM | POA: Diagnosis not present

## 2023-09-29 DIAGNOSIS — N186 End stage renal disease: Secondary | ICD-10-CM | POA: Diagnosis not present

## 2023-09-29 DIAGNOSIS — Z992 Dependence on renal dialysis: Secondary | ICD-10-CM | POA: Diagnosis not present

## 2023-10-01 DIAGNOSIS — N2581 Secondary hyperparathyroidism of renal origin: Secondary | ICD-10-CM | POA: Diagnosis not present

## 2023-10-01 DIAGNOSIS — Z992 Dependence on renal dialysis: Secondary | ICD-10-CM | POA: Diagnosis not present

## 2023-10-01 DIAGNOSIS — N186 End stage renal disease: Secondary | ICD-10-CM | POA: Diagnosis not present

## 2023-10-04 DIAGNOSIS — N186 End stage renal disease: Secondary | ICD-10-CM | POA: Diagnosis not present

## 2023-10-04 DIAGNOSIS — N2581 Secondary hyperparathyroidism of renal origin: Secondary | ICD-10-CM | POA: Diagnosis not present

## 2023-10-04 DIAGNOSIS — Z992 Dependence on renal dialysis: Secondary | ICD-10-CM | POA: Diagnosis not present

## 2023-10-06 DIAGNOSIS — Z992 Dependence on renal dialysis: Secondary | ICD-10-CM | POA: Diagnosis not present

## 2023-10-06 DIAGNOSIS — N2581 Secondary hyperparathyroidism of renal origin: Secondary | ICD-10-CM | POA: Diagnosis not present

## 2023-10-06 DIAGNOSIS — N041 Nephrotic syndrome with focal and segmental glomerular lesions: Secondary | ICD-10-CM | POA: Diagnosis not present

## 2023-10-06 DIAGNOSIS — N186 End stage renal disease: Secondary | ICD-10-CM | POA: Diagnosis not present

## 2023-10-08 DIAGNOSIS — N186 End stage renal disease: Secondary | ICD-10-CM | POA: Diagnosis not present

## 2023-10-08 DIAGNOSIS — Z992 Dependence on renal dialysis: Secondary | ICD-10-CM | POA: Diagnosis not present

## 2023-10-08 DIAGNOSIS — N2581 Secondary hyperparathyroidism of renal origin: Secondary | ICD-10-CM | POA: Diagnosis not present

## 2023-10-11 DIAGNOSIS — N2581 Secondary hyperparathyroidism of renal origin: Secondary | ICD-10-CM | POA: Diagnosis not present

## 2023-10-11 DIAGNOSIS — N186 End stage renal disease: Secondary | ICD-10-CM | POA: Diagnosis not present

## 2023-10-11 DIAGNOSIS — Z992 Dependence on renal dialysis: Secondary | ICD-10-CM | POA: Diagnosis not present

## 2023-10-13 DIAGNOSIS — N2581 Secondary hyperparathyroidism of renal origin: Secondary | ICD-10-CM | POA: Diagnosis not present

## 2023-10-13 DIAGNOSIS — Z992 Dependence on renal dialysis: Secondary | ICD-10-CM | POA: Diagnosis not present

## 2023-10-13 DIAGNOSIS — N186 End stage renal disease: Secondary | ICD-10-CM | POA: Diagnosis not present

## 2023-10-15 DIAGNOSIS — N186 End stage renal disease: Secondary | ICD-10-CM | POA: Diagnosis not present

## 2023-10-15 DIAGNOSIS — N2581 Secondary hyperparathyroidism of renal origin: Secondary | ICD-10-CM | POA: Diagnosis not present

## 2023-10-15 DIAGNOSIS — Z992 Dependence on renal dialysis: Secondary | ICD-10-CM | POA: Diagnosis not present

## 2023-10-18 DIAGNOSIS — N2581 Secondary hyperparathyroidism of renal origin: Secondary | ICD-10-CM | POA: Diagnosis not present

## 2023-10-18 DIAGNOSIS — Z992 Dependence on renal dialysis: Secondary | ICD-10-CM | POA: Diagnosis not present

## 2023-10-18 DIAGNOSIS — N186 End stage renal disease: Secondary | ICD-10-CM | POA: Diagnosis not present

## 2023-10-20 DIAGNOSIS — N186 End stage renal disease: Secondary | ICD-10-CM | POA: Diagnosis not present

## 2023-10-20 DIAGNOSIS — N2581 Secondary hyperparathyroidism of renal origin: Secondary | ICD-10-CM | POA: Diagnosis not present

## 2023-10-20 DIAGNOSIS — Z992 Dependence on renal dialysis: Secondary | ICD-10-CM | POA: Diagnosis not present

## 2023-10-22 DIAGNOSIS — N2581 Secondary hyperparathyroidism of renal origin: Secondary | ICD-10-CM | POA: Diagnosis not present

## 2023-10-22 DIAGNOSIS — Z992 Dependence on renal dialysis: Secondary | ICD-10-CM | POA: Diagnosis not present

## 2023-10-22 DIAGNOSIS — N186 End stage renal disease: Secondary | ICD-10-CM | POA: Diagnosis not present

## 2023-10-25 DIAGNOSIS — N186 End stage renal disease: Secondary | ICD-10-CM | POA: Diagnosis not present

## 2023-10-25 DIAGNOSIS — Z992 Dependence on renal dialysis: Secondary | ICD-10-CM | POA: Diagnosis not present

## 2023-10-25 DIAGNOSIS — N2581 Secondary hyperparathyroidism of renal origin: Secondary | ICD-10-CM | POA: Diagnosis not present

## 2023-10-27 DIAGNOSIS — Z992 Dependence on renal dialysis: Secondary | ICD-10-CM | POA: Diagnosis not present

## 2023-10-27 DIAGNOSIS — N2581 Secondary hyperparathyroidism of renal origin: Secondary | ICD-10-CM | POA: Diagnosis not present

## 2023-10-27 DIAGNOSIS — N186 End stage renal disease: Secondary | ICD-10-CM | POA: Diagnosis not present

## 2023-10-31 DIAGNOSIS — Z992 Dependence on renal dialysis: Secondary | ICD-10-CM | POA: Diagnosis not present

## 2023-10-31 DIAGNOSIS — N2581 Secondary hyperparathyroidism of renal origin: Secondary | ICD-10-CM | POA: Diagnosis not present

## 2023-10-31 DIAGNOSIS — N186 End stage renal disease: Secondary | ICD-10-CM | POA: Diagnosis not present

## 2023-11-02 DIAGNOSIS — N186 End stage renal disease: Secondary | ICD-10-CM | POA: Diagnosis not present

## 2023-11-02 DIAGNOSIS — N2581 Secondary hyperparathyroidism of renal origin: Secondary | ICD-10-CM | POA: Diagnosis not present

## 2023-11-02 DIAGNOSIS — Z992 Dependence on renal dialysis: Secondary | ICD-10-CM | POA: Diagnosis not present

## 2023-11-05 DIAGNOSIS — N041 Nephrotic syndrome with focal and segmental glomerular lesions: Secondary | ICD-10-CM | POA: Diagnosis not present

## 2023-11-05 DIAGNOSIS — N2581 Secondary hyperparathyroidism of renal origin: Secondary | ICD-10-CM | POA: Diagnosis not present

## 2023-11-05 DIAGNOSIS — Z992 Dependence on renal dialysis: Secondary | ICD-10-CM | POA: Diagnosis not present

## 2023-11-05 DIAGNOSIS — N186 End stage renal disease: Secondary | ICD-10-CM | POA: Diagnosis not present

## 2023-11-08 DIAGNOSIS — Z992 Dependence on renal dialysis: Secondary | ICD-10-CM | POA: Diagnosis not present

## 2023-11-08 DIAGNOSIS — N186 End stage renal disease: Secondary | ICD-10-CM | POA: Diagnosis not present

## 2023-11-08 DIAGNOSIS — N2581 Secondary hyperparathyroidism of renal origin: Secondary | ICD-10-CM | POA: Diagnosis not present

## 2023-11-10 DIAGNOSIS — N2581 Secondary hyperparathyroidism of renal origin: Secondary | ICD-10-CM | POA: Diagnosis not present

## 2023-11-10 DIAGNOSIS — Z992 Dependence on renal dialysis: Secondary | ICD-10-CM | POA: Diagnosis not present

## 2023-11-10 DIAGNOSIS — N186 End stage renal disease: Secondary | ICD-10-CM | POA: Diagnosis not present

## 2023-11-12 DIAGNOSIS — N2581 Secondary hyperparathyroidism of renal origin: Secondary | ICD-10-CM | POA: Diagnosis not present

## 2023-11-12 DIAGNOSIS — N186 End stage renal disease: Secondary | ICD-10-CM | POA: Diagnosis not present

## 2023-11-12 DIAGNOSIS — Z992 Dependence on renal dialysis: Secondary | ICD-10-CM | POA: Diagnosis not present

## 2023-11-15 DIAGNOSIS — N186 End stage renal disease: Secondary | ICD-10-CM | POA: Diagnosis not present

## 2023-11-15 DIAGNOSIS — Z992 Dependence on renal dialysis: Secondary | ICD-10-CM | POA: Diagnosis not present

## 2023-11-15 DIAGNOSIS — N2581 Secondary hyperparathyroidism of renal origin: Secondary | ICD-10-CM | POA: Diagnosis not present

## 2023-11-17 DIAGNOSIS — N2581 Secondary hyperparathyroidism of renal origin: Secondary | ICD-10-CM | POA: Diagnosis not present

## 2023-11-17 DIAGNOSIS — Z992 Dependence on renal dialysis: Secondary | ICD-10-CM | POA: Diagnosis not present

## 2023-11-17 DIAGNOSIS — N186 End stage renal disease: Secondary | ICD-10-CM | POA: Diagnosis not present

## 2023-11-19 DIAGNOSIS — N2581 Secondary hyperparathyroidism of renal origin: Secondary | ICD-10-CM | POA: Diagnosis not present

## 2023-11-19 DIAGNOSIS — N186 End stage renal disease: Secondary | ICD-10-CM | POA: Diagnosis not present

## 2023-11-19 DIAGNOSIS — Z992 Dependence on renal dialysis: Secondary | ICD-10-CM | POA: Diagnosis not present

## 2023-11-22 DIAGNOSIS — N2581 Secondary hyperparathyroidism of renal origin: Secondary | ICD-10-CM | POA: Diagnosis not present

## 2023-11-22 DIAGNOSIS — Z992 Dependence on renal dialysis: Secondary | ICD-10-CM | POA: Diagnosis not present

## 2023-11-22 DIAGNOSIS — N186 End stage renal disease: Secondary | ICD-10-CM | POA: Diagnosis not present

## 2023-11-24 DIAGNOSIS — N2581 Secondary hyperparathyroidism of renal origin: Secondary | ICD-10-CM | POA: Diagnosis not present

## 2023-11-24 DIAGNOSIS — Z992 Dependence on renal dialysis: Secondary | ICD-10-CM | POA: Diagnosis not present

## 2023-11-24 DIAGNOSIS — N186 End stage renal disease: Secondary | ICD-10-CM | POA: Diagnosis not present

## 2023-11-26 DIAGNOSIS — Z992 Dependence on renal dialysis: Secondary | ICD-10-CM | POA: Diagnosis not present

## 2023-11-26 DIAGNOSIS — N186 End stage renal disease: Secondary | ICD-10-CM | POA: Diagnosis not present

## 2023-11-26 DIAGNOSIS — N2581 Secondary hyperparathyroidism of renal origin: Secondary | ICD-10-CM | POA: Diagnosis not present

## 2023-11-28 DIAGNOSIS — Z992 Dependence on renal dialysis: Secondary | ICD-10-CM | POA: Diagnosis not present

## 2023-11-28 DIAGNOSIS — N2581 Secondary hyperparathyroidism of renal origin: Secondary | ICD-10-CM | POA: Diagnosis not present

## 2023-11-28 DIAGNOSIS — N186 End stage renal disease: Secondary | ICD-10-CM | POA: Diagnosis not present

## 2023-12-03 DIAGNOSIS — N2581 Secondary hyperparathyroidism of renal origin: Secondary | ICD-10-CM | POA: Diagnosis not present

## 2023-12-03 DIAGNOSIS — Z992 Dependence on renal dialysis: Secondary | ICD-10-CM | POA: Diagnosis not present

## 2023-12-03 DIAGNOSIS — N186 End stage renal disease: Secondary | ICD-10-CM | POA: Diagnosis not present

## 2023-12-06 DIAGNOSIS — N186 End stage renal disease: Secondary | ICD-10-CM | POA: Diagnosis not present

## 2023-12-06 DIAGNOSIS — Z992 Dependence on renal dialysis: Secondary | ICD-10-CM | POA: Diagnosis not present

## 2023-12-06 DIAGNOSIS — N041 Nephrotic syndrome with focal and segmental glomerular lesions: Secondary | ICD-10-CM | POA: Diagnosis not present

## 2023-12-08 DIAGNOSIS — N2581 Secondary hyperparathyroidism of renal origin: Secondary | ICD-10-CM | POA: Diagnosis not present

## 2023-12-08 DIAGNOSIS — N186 End stage renal disease: Secondary | ICD-10-CM | POA: Diagnosis not present

## 2023-12-08 DIAGNOSIS — Z992 Dependence on renal dialysis: Secondary | ICD-10-CM | POA: Diagnosis not present

## 2023-12-10 DIAGNOSIS — Z992 Dependence on renal dialysis: Secondary | ICD-10-CM | POA: Diagnosis not present

## 2023-12-10 DIAGNOSIS — N186 End stage renal disease: Secondary | ICD-10-CM | POA: Diagnosis not present

## 2023-12-10 DIAGNOSIS — N2581 Secondary hyperparathyroidism of renal origin: Secondary | ICD-10-CM | POA: Diagnosis not present

## 2023-12-13 DIAGNOSIS — N186 End stage renal disease: Secondary | ICD-10-CM | POA: Diagnosis not present

## 2023-12-13 DIAGNOSIS — Z992 Dependence on renal dialysis: Secondary | ICD-10-CM | POA: Diagnosis not present

## 2023-12-13 DIAGNOSIS — N2581 Secondary hyperparathyroidism of renal origin: Secondary | ICD-10-CM | POA: Diagnosis not present

## 2023-12-15 DIAGNOSIS — N2581 Secondary hyperparathyroidism of renal origin: Secondary | ICD-10-CM | POA: Diagnosis not present

## 2023-12-15 DIAGNOSIS — Z992 Dependence on renal dialysis: Secondary | ICD-10-CM | POA: Diagnosis not present

## 2023-12-15 DIAGNOSIS — N186 End stage renal disease: Secondary | ICD-10-CM | POA: Diagnosis not present

## 2023-12-20 DIAGNOSIS — N2581 Secondary hyperparathyroidism of renal origin: Secondary | ICD-10-CM | POA: Diagnosis not present

## 2023-12-20 DIAGNOSIS — Z992 Dependence on renal dialysis: Secondary | ICD-10-CM | POA: Diagnosis not present

## 2023-12-20 DIAGNOSIS — N186 End stage renal disease: Secondary | ICD-10-CM | POA: Diagnosis not present

## 2023-12-22 DIAGNOSIS — N186 End stage renal disease: Secondary | ICD-10-CM | POA: Diagnosis not present

## 2023-12-22 DIAGNOSIS — N2581 Secondary hyperparathyroidism of renal origin: Secondary | ICD-10-CM | POA: Diagnosis not present

## 2023-12-22 DIAGNOSIS — Z992 Dependence on renal dialysis: Secondary | ICD-10-CM | POA: Diagnosis not present

## 2023-12-24 DIAGNOSIS — N2581 Secondary hyperparathyroidism of renal origin: Secondary | ICD-10-CM | POA: Diagnosis not present

## 2023-12-24 DIAGNOSIS — N186 End stage renal disease: Secondary | ICD-10-CM | POA: Diagnosis not present

## 2023-12-24 DIAGNOSIS — Z992 Dependence on renal dialysis: Secondary | ICD-10-CM | POA: Diagnosis not present

## 2023-12-27 DIAGNOSIS — Z992 Dependence on renal dialysis: Secondary | ICD-10-CM | POA: Diagnosis not present

## 2023-12-27 DIAGNOSIS — N2581 Secondary hyperparathyroidism of renal origin: Secondary | ICD-10-CM | POA: Diagnosis not present

## 2023-12-27 DIAGNOSIS — N186 End stage renal disease: Secondary | ICD-10-CM | POA: Diagnosis not present

## 2023-12-28 DIAGNOSIS — N2581 Secondary hyperparathyroidism of renal origin: Secondary | ICD-10-CM | POA: Diagnosis not present

## 2023-12-28 DIAGNOSIS — Z992 Dependence on renal dialysis: Secondary | ICD-10-CM | POA: Diagnosis not present

## 2023-12-28 DIAGNOSIS — N186 End stage renal disease: Secondary | ICD-10-CM | POA: Diagnosis not present

## 2023-12-29 DIAGNOSIS — Z992 Dependence on renal dialysis: Secondary | ICD-10-CM | POA: Diagnosis not present

## 2023-12-29 DIAGNOSIS — N2581 Secondary hyperparathyroidism of renal origin: Secondary | ICD-10-CM | POA: Diagnosis not present

## 2023-12-29 DIAGNOSIS — N186 End stage renal disease: Secondary | ICD-10-CM | POA: Diagnosis not present

## 2023-12-31 DIAGNOSIS — N2581 Secondary hyperparathyroidism of renal origin: Secondary | ICD-10-CM | POA: Diagnosis not present

## 2023-12-31 DIAGNOSIS — N186 End stage renal disease: Secondary | ICD-10-CM | POA: Diagnosis not present

## 2023-12-31 DIAGNOSIS — Z992 Dependence on renal dialysis: Secondary | ICD-10-CM | POA: Diagnosis not present

## 2024-01-03 DIAGNOSIS — N186 End stage renal disease: Secondary | ICD-10-CM | POA: Diagnosis not present

## 2024-01-03 DIAGNOSIS — N2581 Secondary hyperparathyroidism of renal origin: Secondary | ICD-10-CM | POA: Diagnosis not present

## 2024-01-03 DIAGNOSIS — Z992 Dependence on renal dialysis: Secondary | ICD-10-CM | POA: Diagnosis not present

## 2024-01-05 DIAGNOSIS — N2581 Secondary hyperparathyroidism of renal origin: Secondary | ICD-10-CM | POA: Diagnosis not present

## 2024-01-05 DIAGNOSIS — N186 End stage renal disease: Secondary | ICD-10-CM | POA: Diagnosis not present

## 2024-01-05 DIAGNOSIS — Z992 Dependence on renal dialysis: Secondary | ICD-10-CM | POA: Diagnosis not present

## 2024-01-06 DIAGNOSIS — N186 End stage renal disease: Secondary | ICD-10-CM | POA: Diagnosis not present

## 2024-01-06 DIAGNOSIS — Z992 Dependence on renal dialysis: Secondary | ICD-10-CM | POA: Diagnosis not present

## 2024-01-06 DIAGNOSIS — N041 Nephrotic syndrome with focal and segmental glomerular lesions: Secondary | ICD-10-CM | POA: Diagnosis not present

## 2024-01-07 DIAGNOSIS — N2581 Secondary hyperparathyroidism of renal origin: Secondary | ICD-10-CM | POA: Diagnosis not present

## 2024-01-07 DIAGNOSIS — N186 End stage renal disease: Secondary | ICD-10-CM | POA: Diagnosis not present

## 2024-01-07 DIAGNOSIS — Z992 Dependence on renal dialysis: Secondary | ICD-10-CM | POA: Diagnosis not present

## 2024-01-10 DIAGNOSIS — N186 End stage renal disease: Secondary | ICD-10-CM | POA: Diagnosis not present

## 2024-01-10 DIAGNOSIS — N2581 Secondary hyperparathyroidism of renal origin: Secondary | ICD-10-CM | POA: Diagnosis not present

## 2024-01-10 DIAGNOSIS — Z992 Dependence on renal dialysis: Secondary | ICD-10-CM | POA: Diagnosis not present

## 2024-01-12 DIAGNOSIS — N2581 Secondary hyperparathyroidism of renal origin: Secondary | ICD-10-CM | POA: Diagnosis not present

## 2024-01-12 DIAGNOSIS — Z992 Dependence on renal dialysis: Secondary | ICD-10-CM | POA: Diagnosis not present

## 2024-01-12 DIAGNOSIS — N186 End stage renal disease: Secondary | ICD-10-CM | POA: Diagnosis not present

## 2024-01-14 DIAGNOSIS — N2581 Secondary hyperparathyroidism of renal origin: Secondary | ICD-10-CM | POA: Diagnosis not present

## 2024-01-14 DIAGNOSIS — N186 End stage renal disease: Secondary | ICD-10-CM | POA: Diagnosis not present

## 2024-01-14 DIAGNOSIS — Z992 Dependence on renal dialysis: Secondary | ICD-10-CM | POA: Diagnosis not present

## 2024-01-17 DIAGNOSIS — Z992 Dependence on renal dialysis: Secondary | ICD-10-CM | POA: Diagnosis not present

## 2024-01-17 DIAGNOSIS — N2581 Secondary hyperparathyroidism of renal origin: Secondary | ICD-10-CM | POA: Diagnosis not present

## 2024-01-17 DIAGNOSIS — N186 End stage renal disease: Secondary | ICD-10-CM | POA: Diagnosis not present

## 2024-01-19 DIAGNOSIS — N186 End stage renal disease: Secondary | ICD-10-CM | POA: Diagnosis not present

## 2024-01-19 DIAGNOSIS — N2581 Secondary hyperparathyroidism of renal origin: Secondary | ICD-10-CM | POA: Diagnosis not present

## 2024-01-19 DIAGNOSIS — Z992 Dependence on renal dialysis: Secondary | ICD-10-CM | POA: Diagnosis not present

## 2024-01-21 DIAGNOSIS — N186 End stage renal disease: Secondary | ICD-10-CM | POA: Diagnosis not present

## 2024-01-21 DIAGNOSIS — N2581 Secondary hyperparathyroidism of renal origin: Secondary | ICD-10-CM | POA: Diagnosis not present

## 2024-01-21 DIAGNOSIS — Z992 Dependence on renal dialysis: Secondary | ICD-10-CM | POA: Diagnosis not present

## 2024-01-24 DIAGNOSIS — Z992 Dependence on renal dialysis: Secondary | ICD-10-CM | POA: Diagnosis not present

## 2024-01-24 DIAGNOSIS — N2581 Secondary hyperparathyroidism of renal origin: Secondary | ICD-10-CM | POA: Diagnosis not present

## 2024-01-24 DIAGNOSIS — N186 End stage renal disease: Secondary | ICD-10-CM | POA: Diagnosis not present

## 2024-01-25 DIAGNOSIS — N186 End stage renal disease: Secondary | ICD-10-CM | POA: Diagnosis not present

## 2024-01-26 DIAGNOSIS — N186 End stage renal disease: Secondary | ICD-10-CM | POA: Diagnosis not present

## 2024-01-26 DIAGNOSIS — Z992 Dependence on renal dialysis: Secondary | ICD-10-CM | POA: Diagnosis not present

## 2024-01-26 DIAGNOSIS — N2581 Secondary hyperparathyroidism of renal origin: Secondary | ICD-10-CM | POA: Diagnosis not present

## 2024-01-27 ENCOUNTER — Telehealth: Payer: Self-pay | Admitting: *Deleted

## 2024-01-27 ENCOUNTER — Telehealth: Payer: Self-pay

## 2024-01-27 DIAGNOSIS — N186 End stage renal disease: Secondary | ICD-10-CM

## 2024-01-27 NOTE — Progress Notes (Signed)
 Complex Care Management Note Care Guide Note  01/27/2024 Name: James [name] MRN: [id number] DOB: 06-21-1956   Complex Care Management Outreach Attempts: An unsuccessful telephone outreach was attempted today to offer the patient information about available complex care management services.  Follow Up Plan:  Additional outreach attempts will be made to offer the patient complex care management information and services.   Encounter Outcome:  No Answer  Gwenevere Ghazi  [facility]  [facility], [facility] Guide  Direct Dial: [phone]  Fax [phone]

## 2024-01-28 DIAGNOSIS — N2581 Secondary hyperparathyroidism of renal origin: Secondary | ICD-10-CM | POA: Diagnosis not present

## 2024-01-28 DIAGNOSIS — N186 End stage renal disease: Secondary | ICD-10-CM | POA: Diagnosis not present

## 2024-01-28 DIAGNOSIS — Z992 Dependence on renal dialysis: Secondary | ICD-10-CM | POA: Diagnosis not present

## 2024-01-30 NOTE — Progress Notes (Unsigned)
 Complex Care Management Note Care Guide Note  01/30/2024 Name: James [name] MRN: [id number] DOB: 1955-12-20   Complex Care Management Outreach Attempts: A second unsuccessful outreach was attempted today to offer the patient with information about available complex care management services.  Follow Up Plan:  Additional outreach attempts will be made to offer the patient complex care management information and services.   Encounter Outcome:  No Answer  Gwenevere Ghazi  [facility]  [place], [facility] Guide  Direct Dial: [phone]  Fax [phone]

## 2024-01-31 DIAGNOSIS — N2581 Secondary hyperparathyroidism of renal origin: Secondary | ICD-10-CM | POA: Diagnosis not present

## 2024-01-31 DIAGNOSIS — N186 End stage renal disease: Secondary | ICD-10-CM | POA: Diagnosis not present

## 2024-01-31 DIAGNOSIS — Z992 Dependence on renal dialysis: Secondary | ICD-10-CM | POA: Diagnosis not present

## 2024-01-31 NOTE — Progress Notes (Signed)
 Complex Care Management Note Care Guide Note  01/31/2024 Name: James [name] MRN: [id number] DOB: [birth year]   Complex Care Management Outreach Attempts: A third unsuccessful outreach was attempted today to offer the patient with information about available complex care management services.  Follow Up Plan:  No further outreach attempts will be made at this time. We have been unable to contact the patient to offer or enroll patient in complex care management services.  Encounter Outcome:  No Answer  Gwenevere Ghazi  [facility]  [facility], [facility] Guide  Direct Dial: [phone]  Fax [phone]

## 2024-02-02 DIAGNOSIS — N2581 Secondary hyperparathyroidism of renal origin: Secondary | ICD-10-CM | POA: Diagnosis not present

## 2024-02-02 DIAGNOSIS — Z992 Dependence on renal dialysis: Secondary | ICD-10-CM | POA: Diagnosis not present

## 2024-02-02 DIAGNOSIS — N186 End stage renal disease: Secondary | ICD-10-CM | POA: Diagnosis not present

## 2024-02-03 DIAGNOSIS — Z992 Dependence on renal dialysis: Secondary | ICD-10-CM | POA: Diagnosis not present

## 2024-02-03 DIAGNOSIS — N186 End stage renal disease: Secondary | ICD-10-CM | POA: Diagnosis not present

## 2024-02-03 DIAGNOSIS — N041 Nephrotic syndrome with focal and segmental glomerular lesions: Secondary | ICD-10-CM | POA: Diagnosis not present

## 2024-02-04 DIAGNOSIS — N2581 Secondary hyperparathyroidism of renal origin: Secondary | ICD-10-CM | POA: Diagnosis not present

## 2024-02-04 DIAGNOSIS — N186 End stage renal disease: Secondary | ICD-10-CM | POA: Diagnosis not present

## 2024-02-04 DIAGNOSIS — Z992 Dependence on renal dialysis: Secondary | ICD-10-CM | POA: Diagnosis not present

## 2024-02-07 DIAGNOSIS — Z992 Dependence on renal dialysis: Secondary | ICD-10-CM | POA: Diagnosis not present

## 2024-02-07 DIAGNOSIS — N186 End stage renal disease: Secondary | ICD-10-CM | POA: Diagnosis not present

## 2024-02-07 DIAGNOSIS — N2581 Secondary hyperparathyroidism of renal origin: Secondary | ICD-10-CM | POA: Diagnosis not present

## 2024-02-09 DIAGNOSIS — N2581 Secondary hyperparathyroidism of renal origin: Secondary | ICD-10-CM | POA: Diagnosis not present

## 2024-02-09 DIAGNOSIS — N186 End stage renal disease: Secondary | ICD-10-CM | POA: Diagnosis not present

## 2024-02-09 DIAGNOSIS — Z992 Dependence on renal dialysis: Secondary | ICD-10-CM | POA: Diagnosis not present

## 2024-02-11 DIAGNOSIS — N2581 Secondary hyperparathyroidism of renal origin: Secondary | ICD-10-CM | POA: Diagnosis not present

## 2024-02-11 DIAGNOSIS — Z992 Dependence on renal dialysis: Secondary | ICD-10-CM | POA: Diagnosis not present

## 2024-02-11 DIAGNOSIS — N186 End stage renal disease: Secondary | ICD-10-CM | POA: Diagnosis not present

## 2024-02-14 DIAGNOSIS — Z992 Dependence on renal dialysis: Secondary | ICD-10-CM | POA: Diagnosis not present

## 2024-02-14 DIAGNOSIS — N186 End stage renal disease: Secondary | ICD-10-CM | POA: Diagnosis not present

## 2024-02-14 DIAGNOSIS — N2581 Secondary hyperparathyroidism of renal origin: Secondary | ICD-10-CM | POA: Diagnosis not present

## 2024-02-16 DIAGNOSIS — Z992 Dependence on renal dialysis: Secondary | ICD-10-CM | POA: Diagnosis not present

## 2024-02-16 DIAGNOSIS — N2581 Secondary hyperparathyroidism of renal origin: Secondary | ICD-10-CM | POA: Diagnosis not present

## 2024-02-16 DIAGNOSIS — N186 End stage renal disease: Secondary | ICD-10-CM | POA: Diagnosis not present

## 2024-02-21 DIAGNOSIS — N186 End stage renal disease: Secondary | ICD-10-CM | POA: Diagnosis not present

## 2024-02-21 DIAGNOSIS — N2581 Secondary hyperparathyroidism of renal origin: Secondary | ICD-10-CM | POA: Diagnosis not present

## 2024-02-21 DIAGNOSIS — Z992 Dependence on renal dialysis: Secondary | ICD-10-CM | POA: Diagnosis not present

## 2024-02-23 DIAGNOSIS — N186 End stage renal disease: Secondary | ICD-10-CM | POA: Diagnosis not present

## 2024-02-23 DIAGNOSIS — N2581 Secondary hyperparathyroidism of renal origin: Secondary | ICD-10-CM | POA: Diagnosis not present

## 2024-02-23 DIAGNOSIS — Z992 Dependence on renal dialysis: Secondary | ICD-10-CM | POA: Diagnosis not present

## 2024-02-25 DIAGNOSIS — Z992 Dependence on renal dialysis: Secondary | ICD-10-CM | POA: Diagnosis not present

## 2024-02-25 DIAGNOSIS — N186 End stage renal disease: Secondary | ICD-10-CM | POA: Diagnosis not present

## 2024-02-25 DIAGNOSIS — N2581 Secondary hyperparathyroidism of renal origin: Secondary | ICD-10-CM | POA: Diagnosis not present

## 2024-02-28 DIAGNOSIS — N186 End stage renal disease: Secondary | ICD-10-CM | POA: Diagnosis not present

## 2024-02-28 DIAGNOSIS — Z992 Dependence on renal dialysis: Secondary | ICD-10-CM | POA: Diagnosis not present

## 2024-02-28 DIAGNOSIS — N2581 Secondary hyperparathyroidism of renal origin: Secondary | ICD-10-CM | POA: Diagnosis not present

## 2024-03-01 DIAGNOSIS — Z992 Dependence on renal dialysis: Secondary | ICD-10-CM | POA: Diagnosis not present

## 2024-03-01 DIAGNOSIS — N2581 Secondary hyperparathyroidism of renal origin: Secondary | ICD-10-CM | POA: Diagnosis not present

## 2024-03-01 DIAGNOSIS — N186 End stage renal disease: Secondary | ICD-10-CM | POA: Diagnosis not present

## 2024-03-03 DIAGNOSIS — N186 End stage renal disease: Secondary | ICD-10-CM | POA: Diagnosis not present

## 2024-03-03 DIAGNOSIS — N2581 Secondary hyperparathyroidism of renal origin: Secondary | ICD-10-CM | POA: Diagnosis not present

## 2024-03-03 DIAGNOSIS — Z992 Dependence on renal dialysis: Secondary | ICD-10-CM | POA: Diagnosis not present

## 2024-03-05 DIAGNOSIS — Z992 Dependence on renal dialysis: Secondary | ICD-10-CM | POA: Diagnosis not present

## 2024-03-05 DIAGNOSIS — N041 Nephrotic syndrome with focal and segmental glomerular lesions: Secondary | ICD-10-CM | POA: Diagnosis not present

## 2024-03-05 DIAGNOSIS — N186 End stage renal disease: Secondary | ICD-10-CM | POA: Diagnosis not present

## 2024-03-06 DIAGNOSIS — N2581 Secondary hyperparathyroidism of renal origin: Secondary | ICD-10-CM | POA: Diagnosis not present

## 2024-03-06 DIAGNOSIS — Z992 Dependence on renal dialysis: Secondary | ICD-10-CM | POA: Diagnosis not present

## 2024-03-06 DIAGNOSIS — N186 End stage renal disease: Secondary | ICD-10-CM | POA: Diagnosis not present

## 2024-03-08 DIAGNOSIS — N2581 Secondary hyperparathyroidism of renal origin: Secondary | ICD-10-CM | POA: Diagnosis not present

## 2024-03-08 DIAGNOSIS — N186 End stage renal disease: Secondary | ICD-10-CM | POA: Diagnosis not present

## 2024-03-08 DIAGNOSIS — Z992 Dependence on renal dialysis: Secondary | ICD-10-CM | POA: Diagnosis not present

## 2024-03-10 DIAGNOSIS — N186 End stage renal disease: Secondary | ICD-10-CM | POA: Diagnosis not present

## 2024-03-10 DIAGNOSIS — N2581 Secondary hyperparathyroidism of renal origin: Secondary | ICD-10-CM | POA: Diagnosis not present

## 2024-03-10 DIAGNOSIS — Z992 Dependence on renal dialysis: Secondary | ICD-10-CM | POA: Diagnosis not present

## 2024-03-13 DIAGNOSIS — Z992 Dependence on renal dialysis: Secondary | ICD-10-CM | POA: Diagnosis not present

## 2024-03-13 DIAGNOSIS — N186 End stage renal disease: Secondary | ICD-10-CM | POA: Diagnosis not present

## 2024-03-13 DIAGNOSIS — N2581 Secondary hyperparathyroidism of renal origin: Secondary | ICD-10-CM | POA: Diagnosis not present

## 2024-03-15 DIAGNOSIS — N2581 Secondary hyperparathyroidism of renal origin: Secondary | ICD-10-CM | POA: Diagnosis not present

## 2024-03-15 DIAGNOSIS — N186 End stage renal disease: Secondary | ICD-10-CM | POA: Diagnosis not present

## 2024-03-15 DIAGNOSIS — Z992 Dependence on renal dialysis: Secondary | ICD-10-CM | POA: Diagnosis not present

## 2024-03-20 DIAGNOSIS — N186 End stage renal disease: Secondary | ICD-10-CM | POA: Diagnosis not present

## 2024-03-20 DIAGNOSIS — Z992 Dependence on renal dialysis: Secondary | ICD-10-CM | POA: Diagnosis not present

## 2024-03-20 DIAGNOSIS — N2581 Secondary hyperparathyroidism of renal origin: Secondary | ICD-10-CM | POA: Diagnosis not present

## 2024-03-22 DIAGNOSIS — Z992 Dependence on renal dialysis: Secondary | ICD-10-CM | POA: Diagnosis not present

## 2024-03-22 DIAGNOSIS — N2581 Secondary hyperparathyroidism of renal origin: Secondary | ICD-10-CM | POA: Diagnosis not present

## 2024-03-22 DIAGNOSIS — N186 End stage renal disease: Secondary | ICD-10-CM | POA: Diagnosis not present

## 2024-03-24 DIAGNOSIS — Z992 Dependence on renal dialysis: Secondary | ICD-10-CM | POA: Diagnosis not present

## 2024-03-24 DIAGNOSIS — N2581 Secondary hyperparathyroidism of renal origin: Secondary | ICD-10-CM | POA: Diagnosis not present

## 2024-03-24 DIAGNOSIS — N186 End stage renal disease: Secondary | ICD-10-CM | POA: Diagnosis not present

## 2024-03-25 DIAGNOSIS — N186 End stage renal disease: Secondary | ICD-10-CM | POA: Diagnosis not present

## 2024-03-27 DIAGNOSIS — N186 End stage renal disease: Secondary | ICD-10-CM | POA: Diagnosis not present

## 2024-03-27 DIAGNOSIS — Z992 Dependence on renal dialysis: Secondary | ICD-10-CM | POA: Diagnosis not present

## 2024-03-27 DIAGNOSIS — N2581 Secondary hyperparathyroidism of renal origin: Secondary | ICD-10-CM | POA: Diagnosis not present

## 2024-03-29 DIAGNOSIS — Z992 Dependence on renal dialysis: Secondary | ICD-10-CM | POA: Diagnosis not present

## 2024-03-29 DIAGNOSIS — N186 End stage renal disease: Secondary | ICD-10-CM | POA: Diagnosis not present

## 2024-03-29 DIAGNOSIS — N2581 Secondary hyperparathyroidism of renal origin: Secondary | ICD-10-CM | POA: Diagnosis not present

## 2024-03-31 DIAGNOSIS — N2581 Secondary hyperparathyroidism of renal origin: Secondary | ICD-10-CM | POA: Diagnosis not present

## 2024-03-31 DIAGNOSIS — N186 End stage renal disease: Secondary | ICD-10-CM | POA: Diagnosis not present

## 2024-03-31 DIAGNOSIS — Z992 Dependence on renal dialysis: Secondary | ICD-10-CM | POA: Diagnosis not present

## 2024-04-03 DIAGNOSIS — N186 End stage renal disease: Secondary | ICD-10-CM | POA: Diagnosis not present

## 2024-04-03 DIAGNOSIS — Z992 Dependence on renal dialysis: Secondary | ICD-10-CM | POA: Diagnosis not present

## 2024-04-03 DIAGNOSIS — N2581 Secondary hyperparathyroidism of renal origin: Secondary | ICD-10-CM | POA: Diagnosis not present

## 2024-04-04 DIAGNOSIS — N041 Nephrotic syndrome with focal and segmental glomerular lesions: Secondary | ICD-10-CM | POA: Diagnosis not present

## 2024-04-04 DIAGNOSIS — Z992 Dependence on renal dialysis: Secondary | ICD-10-CM | POA: Diagnosis not present

## 2024-04-04 DIAGNOSIS — N186 End stage renal disease: Secondary | ICD-10-CM | POA: Diagnosis not present

## 2024-04-07 DIAGNOSIS — N186 End stage renal disease: Secondary | ICD-10-CM | POA: Diagnosis not present

## 2024-04-07 DIAGNOSIS — Z992 Dependence on renal dialysis: Secondary | ICD-10-CM | POA: Diagnosis not present

## 2024-04-07 DIAGNOSIS — N2581 Secondary hyperparathyroidism of renal origin: Secondary | ICD-10-CM | POA: Diagnosis not present

## 2024-04-10 DIAGNOSIS — Z992 Dependence on renal dialysis: Secondary | ICD-10-CM | POA: Diagnosis not present

## 2024-04-10 DIAGNOSIS — N186 End stage renal disease: Secondary | ICD-10-CM | POA: Diagnosis not present

## 2024-04-10 DIAGNOSIS — N2581 Secondary hyperparathyroidism of renal origin: Secondary | ICD-10-CM | POA: Diagnosis not present

## 2024-04-12 DIAGNOSIS — Z992 Dependence on renal dialysis: Secondary | ICD-10-CM | POA: Diagnosis not present

## 2024-04-12 DIAGNOSIS — N2581 Secondary hyperparathyroidism of renal origin: Secondary | ICD-10-CM | POA: Diagnosis not present

## 2024-04-12 DIAGNOSIS — N186 End stage renal disease: Secondary | ICD-10-CM | POA: Diagnosis not present

## 2024-04-14 DIAGNOSIS — N186 End stage renal disease: Secondary | ICD-10-CM | POA: Diagnosis not present

## 2024-04-14 DIAGNOSIS — N2581 Secondary hyperparathyroidism of renal origin: Secondary | ICD-10-CM | POA: Diagnosis not present

## 2024-04-14 DIAGNOSIS — Z992 Dependence on renal dialysis: Secondary | ICD-10-CM | POA: Diagnosis not present

## 2024-04-19 DIAGNOSIS — N2581 Secondary hyperparathyroidism of renal origin: Secondary | ICD-10-CM | POA: Diagnosis not present

## 2024-04-19 DIAGNOSIS — Z992 Dependence on renal dialysis: Secondary | ICD-10-CM | POA: Diagnosis not present

## 2024-04-19 DIAGNOSIS — N186 End stage renal disease: Secondary | ICD-10-CM | POA: Diagnosis not present

## 2024-04-21 DIAGNOSIS — N186 End stage renal disease: Secondary | ICD-10-CM | POA: Diagnosis not present

## 2024-04-21 DIAGNOSIS — Z992 Dependence on renal dialysis: Secondary | ICD-10-CM | POA: Diagnosis not present

## 2024-04-21 DIAGNOSIS — N2581 Secondary hyperparathyroidism of renal origin: Secondary | ICD-10-CM | POA: Diagnosis not present

## 2024-04-24 DIAGNOSIS — N2581 Secondary hyperparathyroidism of renal origin: Secondary | ICD-10-CM | POA: Diagnosis not present

## 2024-04-24 DIAGNOSIS — Z992 Dependence on renal dialysis: Secondary | ICD-10-CM | POA: Diagnosis not present

## 2024-04-24 DIAGNOSIS — N186 End stage renal disease: Secondary | ICD-10-CM | POA: Diagnosis not present

## 2024-04-26 DIAGNOSIS — N186 End stage renal disease: Secondary | ICD-10-CM | POA: Diagnosis not present

## 2024-04-26 DIAGNOSIS — N2581 Secondary hyperparathyroidism of renal origin: Secondary | ICD-10-CM | POA: Diagnosis not present

## 2024-04-26 DIAGNOSIS — Z992 Dependence on renal dialysis: Secondary | ICD-10-CM | POA: Diagnosis not present

## 2024-04-28 DIAGNOSIS — N2581 Secondary hyperparathyroidism of renal origin: Secondary | ICD-10-CM | POA: Diagnosis not present

## 2024-04-28 DIAGNOSIS — Z992 Dependence on renal dialysis: Secondary | ICD-10-CM | POA: Diagnosis not present

## 2024-04-28 DIAGNOSIS — N186 End stage renal disease: Secondary | ICD-10-CM | POA: Diagnosis not present

## 2024-05-01 DIAGNOSIS — N2581 Secondary hyperparathyroidism of renal origin: Secondary | ICD-10-CM | POA: Diagnosis not present

## 2024-05-01 DIAGNOSIS — N186 End stage renal disease: Secondary | ICD-10-CM | POA: Diagnosis not present

## 2024-05-01 DIAGNOSIS — Z992 Dependence on renal dialysis: Secondary | ICD-10-CM | POA: Diagnosis not present

## 2024-05-03 DIAGNOSIS — N2581 Secondary hyperparathyroidism of renal origin: Secondary | ICD-10-CM | POA: Diagnosis not present

## 2024-05-03 DIAGNOSIS — Z992 Dependence on renal dialysis: Secondary | ICD-10-CM | POA: Diagnosis not present

## 2024-05-03 DIAGNOSIS — N186 End stage renal disease: Secondary | ICD-10-CM | POA: Diagnosis not present

## 2024-05-05 DIAGNOSIS — N186 End stage renal disease: Secondary | ICD-10-CM | POA: Diagnosis not present

## 2024-05-05 DIAGNOSIS — Z992 Dependence on renal dialysis: Secondary | ICD-10-CM | POA: Diagnosis not present

## 2024-05-05 DIAGNOSIS — N041 Nephrotic syndrome with focal and segmental glomerular lesions: Secondary | ICD-10-CM | POA: Diagnosis not present

## 2024-05-08 DIAGNOSIS — N2581 Secondary hyperparathyroidism of renal origin: Secondary | ICD-10-CM | POA: Diagnosis not present

## 2024-05-08 DIAGNOSIS — Z992 Dependence on renal dialysis: Secondary | ICD-10-CM | POA: Diagnosis not present

## 2024-05-08 DIAGNOSIS — N186 End stage renal disease: Secondary | ICD-10-CM | POA: Diagnosis not present

## 2024-05-10 DIAGNOSIS — N2581 Secondary hyperparathyroidism of renal origin: Secondary | ICD-10-CM | POA: Diagnosis not present

## 2024-05-10 DIAGNOSIS — Z992 Dependence on renal dialysis: Secondary | ICD-10-CM | POA: Diagnosis not present

## 2024-05-10 DIAGNOSIS — N186 End stage renal disease: Secondary | ICD-10-CM | POA: Diagnosis not present

## 2024-05-12 DIAGNOSIS — Z992 Dependence on renal dialysis: Secondary | ICD-10-CM | POA: Diagnosis not present

## 2024-05-12 DIAGNOSIS — N2581 Secondary hyperparathyroidism of renal origin: Secondary | ICD-10-CM | POA: Diagnosis not present

## 2024-05-12 DIAGNOSIS — N186 End stage renal disease: Secondary | ICD-10-CM | POA: Diagnosis not present

## 2024-05-15 DIAGNOSIS — N186 End stage renal disease: Secondary | ICD-10-CM | POA: Diagnosis not present

## 2024-05-15 DIAGNOSIS — N2581 Secondary hyperparathyroidism of renal origin: Secondary | ICD-10-CM | POA: Diagnosis not present

## 2024-05-15 DIAGNOSIS — Z992 Dependence on renal dialysis: Secondary | ICD-10-CM | POA: Diagnosis not present

## 2024-05-17 DIAGNOSIS — N2581 Secondary hyperparathyroidism of renal origin: Secondary | ICD-10-CM | POA: Diagnosis not present

## 2024-05-17 DIAGNOSIS — N186 End stage renal disease: Secondary | ICD-10-CM | POA: Diagnosis not present

## 2024-05-17 DIAGNOSIS — Z992 Dependence on renal dialysis: Secondary | ICD-10-CM | POA: Diagnosis not present

## 2024-05-19 DIAGNOSIS — Z992 Dependence on renal dialysis: Secondary | ICD-10-CM | POA: Diagnosis not present

## 2024-05-19 DIAGNOSIS — N2581 Secondary hyperparathyroidism of renal origin: Secondary | ICD-10-CM | POA: Diagnosis not present

## 2024-05-19 DIAGNOSIS — N186 End stage renal disease: Secondary | ICD-10-CM | POA: Diagnosis not present

## 2024-05-22 DIAGNOSIS — N2581 Secondary hyperparathyroidism of renal origin: Secondary | ICD-10-CM | POA: Diagnosis not present

## 2024-05-22 DIAGNOSIS — N186 End stage renal disease: Secondary | ICD-10-CM | POA: Diagnosis not present

## 2024-05-22 DIAGNOSIS — Z992 Dependence on renal dialysis: Secondary | ICD-10-CM | POA: Diagnosis not present

## 2024-05-24 DIAGNOSIS — N186 End stage renal disease: Secondary | ICD-10-CM | POA: Diagnosis not present

## 2024-05-24 DIAGNOSIS — Z992 Dependence on renal dialysis: Secondary | ICD-10-CM | POA: Diagnosis not present

## 2024-05-24 DIAGNOSIS — N2581 Secondary hyperparathyroidism of renal origin: Secondary | ICD-10-CM | POA: Diagnosis not present

## 2024-05-26 DIAGNOSIS — N2581 Secondary hyperparathyroidism of renal origin: Secondary | ICD-10-CM | POA: Diagnosis not present

## 2024-05-26 DIAGNOSIS — Z992 Dependence on renal dialysis: Secondary | ICD-10-CM | POA: Diagnosis not present

## 2024-05-26 DIAGNOSIS — N186 End stage renal disease: Secondary | ICD-10-CM | POA: Diagnosis not present

## 2024-05-29 DIAGNOSIS — Z992 Dependence on renal dialysis: Secondary | ICD-10-CM | POA: Diagnosis not present

## 2024-05-29 DIAGNOSIS — N186 End stage renal disease: Secondary | ICD-10-CM | POA: Diagnosis not present

## 2024-05-29 DIAGNOSIS — N2581 Secondary hyperparathyroidism of renal origin: Secondary | ICD-10-CM | POA: Diagnosis not present

## 2024-05-31 DIAGNOSIS — N2581 Secondary hyperparathyroidism of renal origin: Secondary | ICD-10-CM | POA: Diagnosis not present

## 2024-05-31 DIAGNOSIS — N186 End stage renal disease: Secondary | ICD-10-CM | POA: Diagnosis not present

## 2024-05-31 DIAGNOSIS — Z992 Dependence on renal dialysis: Secondary | ICD-10-CM | POA: Diagnosis not present

## 2024-06-02 DIAGNOSIS — N186 End stage renal disease: Secondary | ICD-10-CM | POA: Diagnosis not present

## 2024-06-02 DIAGNOSIS — N2581 Secondary hyperparathyroidism of renal origin: Secondary | ICD-10-CM | POA: Diagnosis not present

## 2024-06-02 DIAGNOSIS — Z992 Dependence on renal dialysis: Secondary | ICD-10-CM | POA: Diagnosis not present

## 2024-06-04 ENCOUNTER — Ambulatory Visit: Payer: Self-pay | Admitting: *Deleted

## 2024-06-04 DIAGNOSIS — Z992 Dependence on renal dialysis: Secondary | ICD-10-CM | POA: Diagnosis not present

## 2024-06-04 DIAGNOSIS — N186 End stage renal disease: Secondary | ICD-10-CM | POA: Diagnosis not present

## 2024-06-04 DIAGNOSIS — N041 Nephrotic syndrome with focal and segmental glomerular lesions: Secondary | ICD-10-CM | POA: Diagnosis not present

## 2024-06-04 NOTE — Telephone Encounter (Signed)
 2nd attempt to call pt: called pt: no answer - the message states The number dialed is not in service: please check the number and call again.

## 2024-06-04 NOTE — Telephone Encounter (Signed)
 Message from [facility] G sent at 06/04/2024 10:48 AM EDT  Patient has never been with us .. did new patient appt.. complaining of leg swelling.    Call History  Contact Date/Time Type Contact Phone/Fax By  06/04/2024 10:38 AM EDT Phone (Incoming) [place], [place] (Self) [phone] (M) Clemetine Ole PARAS  [phone]

## 2024-06-04 NOTE — Telephone Encounter (Signed)
 First attempt to return his call regarding swelling in his leg.    He is not established with a PCP.   Has a new pt appt for 08/22/2024 at [facility] with Nena Hummer, NP at 10:15.    Voicemail is not set up yet recording is what I get when I attempted to contact him.  Will attempt again later.

## 2024-06-04 NOTE — Telephone Encounter (Signed)
 3rd attempt to contact pt: called pt: no answer - the message states The number dialed is not in service: please check the number and call again.

## 2024-06-05 DIAGNOSIS — Z992 Dependence on renal dialysis: Secondary | ICD-10-CM | POA: Diagnosis not present

## 2024-06-05 DIAGNOSIS — N2581 Secondary hyperparathyroidism of renal origin: Secondary | ICD-10-CM | POA: Diagnosis not present

## 2024-06-05 DIAGNOSIS — N186 End stage renal disease: Secondary | ICD-10-CM | POA: Diagnosis not present

## 2024-06-07 DIAGNOSIS — N2581 Secondary hyperparathyroidism of renal origin: Secondary | ICD-10-CM | POA: Diagnosis not present

## 2024-06-07 DIAGNOSIS — N186 End stage renal disease: Secondary | ICD-10-CM | POA: Diagnosis not present

## 2024-06-07 DIAGNOSIS — Z992 Dependence on renal dialysis: Secondary | ICD-10-CM | POA: Diagnosis not present

## 2024-06-12 DIAGNOSIS — N2581 Secondary hyperparathyroidism of renal origin: Secondary | ICD-10-CM | POA: Diagnosis not present

## 2024-06-12 DIAGNOSIS — N186 End stage renal disease: Secondary | ICD-10-CM | POA: Diagnosis not present

## 2024-06-12 DIAGNOSIS — Z992 Dependence on renal dialysis: Secondary | ICD-10-CM | POA: Diagnosis not present

## 2024-06-14 DIAGNOSIS — N186 End stage renal disease: Secondary | ICD-10-CM | POA: Diagnosis not present

## 2024-06-14 DIAGNOSIS — Z992 Dependence on renal dialysis: Secondary | ICD-10-CM | POA: Diagnosis not present

## 2024-06-14 DIAGNOSIS — N2581 Secondary hyperparathyroidism of renal origin: Secondary | ICD-10-CM | POA: Diagnosis not present

## 2024-06-16 DIAGNOSIS — N2581 Secondary hyperparathyroidism of renal origin: Secondary | ICD-10-CM | POA: Diagnosis not present

## 2024-06-16 DIAGNOSIS — Z992 Dependence on renal dialysis: Secondary | ICD-10-CM | POA: Diagnosis not present

## 2024-06-16 DIAGNOSIS — N186 End stage renal disease: Secondary | ICD-10-CM | POA: Diagnosis not present

## 2024-06-19 DIAGNOSIS — Z992 Dependence on renal dialysis: Secondary | ICD-10-CM | POA: Diagnosis not present

## 2024-06-19 DIAGNOSIS — N186 End stage renal disease: Secondary | ICD-10-CM | POA: Diagnosis not present

## 2024-06-19 DIAGNOSIS — N2581 Secondary hyperparathyroidism of renal origin: Secondary | ICD-10-CM | POA: Diagnosis not present

## 2024-06-21 DIAGNOSIS — N2581 Secondary hyperparathyroidism of renal origin: Secondary | ICD-10-CM | POA: Diagnosis not present

## 2024-06-21 DIAGNOSIS — Z992 Dependence on renal dialysis: Secondary | ICD-10-CM | POA: Diagnosis not present

## 2024-06-21 DIAGNOSIS — N186 End stage renal disease: Secondary | ICD-10-CM | POA: Diagnosis not present

## 2024-06-23 DIAGNOSIS — N186 End stage renal disease: Secondary | ICD-10-CM | POA: Diagnosis not present

## 2024-06-23 DIAGNOSIS — Z992 Dependence on renal dialysis: Secondary | ICD-10-CM | POA: Diagnosis not present

## 2024-06-23 DIAGNOSIS — N2581 Secondary hyperparathyroidism of renal origin: Secondary | ICD-10-CM | POA: Diagnosis not present

## 2024-06-26 DIAGNOSIS — Z992 Dependence on renal dialysis: Secondary | ICD-10-CM | POA: Diagnosis not present

## 2024-06-26 DIAGNOSIS — N186 End stage renal disease: Secondary | ICD-10-CM | POA: Diagnosis not present

## 2024-06-26 DIAGNOSIS — N2581 Secondary hyperparathyroidism of renal origin: Secondary | ICD-10-CM | POA: Diagnosis not present

## 2024-06-28 DIAGNOSIS — N186 End stage renal disease: Secondary | ICD-10-CM | POA: Diagnosis not present

## 2024-06-28 DIAGNOSIS — Z992 Dependence on renal dialysis: Secondary | ICD-10-CM | POA: Diagnosis not present

## 2024-06-28 DIAGNOSIS — N2581 Secondary hyperparathyroidism of renal origin: Secondary | ICD-10-CM | POA: Diagnosis not present

## 2024-06-30 DIAGNOSIS — Z992 Dependence on renal dialysis: Secondary | ICD-10-CM | POA: Diagnosis not present

## 2024-06-30 DIAGNOSIS — N2581 Secondary hyperparathyroidism of renal origin: Secondary | ICD-10-CM | POA: Diagnosis not present

## 2024-06-30 DIAGNOSIS — N186 End stage renal disease: Secondary | ICD-10-CM | POA: Diagnosis not present

## 2024-07-03 DIAGNOSIS — N2581 Secondary hyperparathyroidism of renal origin: Secondary | ICD-10-CM | POA: Diagnosis not present

## 2024-07-03 DIAGNOSIS — N186 End stage renal disease: Secondary | ICD-10-CM | POA: Diagnosis not present

## 2024-07-03 DIAGNOSIS — Z992 Dependence on renal dialysis: Secondary | ICD-10-CM | POA: Diagnosis not present

## 2024-07-05 DIAGNOSIS — N2581 Secondary hyperparathyroidism of renal origin: Secondary | ICD-10-CM | POA: Diagnosis not present

## 2024-07-05 DIAGNOSIS — N186 End stage renal disease: Secondary | ICD-10-CM | POA: Diagnosis not present

## 2024-07-05 DIAGNOSIS — Z992 Dependence on renal dialysis: Secondary | ICD-10-CM | POA: Diagnosis not present

## 2024-07-05 DIAGNOSIS — N041 Nephrotic syndrome with focal and segmental glomerular lesions: Secondary | ICD-10-CM | POA: Diagnosis not present

## 2024-07-07 DIAGNOSIS — N2581 Secondary hyperparathyroidism of renal origin: Secondary | ICD-10-CM | POA: Diagnosis not present

## 2024-07-07 DIAGNOSIS — Z992 Dependence on renal dialysis: Secondary | ICD-10-CM | POA: Diagnosis not present

## 2024-07-07 DIAGNOSIS — N186 End stage renal disease: Secondary | ICD-10-CM | POA: Diagnosis not present

## 2024-07-12 DIAGNOSIS — Z992 Dependence on renal dialysis: Secondary | ICD-10-CM | POA: Diagnosis not present

## 2024-07-12 DIAGNOSIS — N186 End stage renal disease: Secondary | ICD-10-CM | POA: Diagnosis not present

## 2024-07-12 DIAGNOSIS — N2581 Secondary hyperparathyroidism of renal origin: Secondary | ICD-10-CM | POA: Diagnosis not present

## 2024-07-14 DIAGNOSIS — Z992 Dependence on renal dialysis: Secondary | ICD-10-CM | POA: Diagnosis not present

## 2024-07-14 DIAGNOSIS — N2581 Secondary hyperparathyroidism of renal origin: Secondary | ICD-10-CM | POA: Diagnosis not present

## 2024-07-14 DIAGNOSIS — N186 End stage renal disease: Secondary | ICD-10-CM | POA: Diagnosis not present

## 2024-07-16 DIAGNOSIS — N186 End stage renal disease: Secondary | ICD-10-CM | POA: Diagnosis not present

## 2024-07-16 DIAGNOSIS — Z992 Dependence on renal dialysis: Secondary | ICD-10-CM | POA: Diagnosis not present

## 2024-07-16 DIAGNOSIS — N2581 Secondary hyperparathyroidism of renal origin: Secondary | ICD-10-CM | POA: Diagnosis not present

## 2024-07-17 DIAGNOSIS — Z992 Dependence on renal dialysis: Secondary | ICD-10-CM | POA: Diagnosis not present

## 2024-07-17 DIAGNOSIS — N2581 Secondary hyperparathyroidism of renal origin: Secondary | ICD-10-CM | POA: Diagnosis not present

## 2024-07-17 DIAGNOSIS — N186 End stage renal disease: Secondary | ICD-10-CM | POA: Diagnosis not present

## 2024-07-19 DIAGNOSIS — N186 End stage renal disease: Secondary | ICD-10-CM | POA: Diagnosis not present

## 2024-07-19 DIAGNOSIS — Z992 Dependence on renal dialysis: Secondary | ICD-10-CM | POA: Diagnosis not present

## 2024-07-19 DIAGNOSIS — N2581 Secondary hyperparathyroidism of renal origin: Secondary | ICD-10-CM | POA: Diagnosis not present

## 2024-07-21 DIAGNOSIS — N186 End stage renal disease: Secondary | ICD-10-CM | POA: Diagnosis not present

## 2024-07-21 DIAGNOSIS — Z992 Dependence on renal dialysis: Secondary | ICD-10-CM | POA: Diagnosis not present

## 2024-07-21 DIAGNOSIS — N2581 Secondary hyperparathyroidism of renal origin: Secondary | ICD-10-CM | POA: Diagnosis not present

## 2024-07-23 DIAGNOSIS — N186 End stage renal disease: Secondary | ICD-10-CM | POA: Diagnosis not present

## 2024-07-24 DIAGNOSIS — Z992 Dependence on renal dialysis: Secondary | ICD-10-CM | POA: Diagnosis not present

## 2024-07-24 DIAGNOSIS — N2581 Secondary hyperparathyroidism of renal origin: Secondary | ICD-10-CM | POA: Diagnosis not present

## 2024-07-24 DIAGNOSIS — N186 End stage renal disease: Secondary | ICD-10-CM | POA: Diagnosis not present

## 2024-07-26 DIAGNOSIS — Z992 Dependence on renal dialysis: Secondary | ICD-10-CM | POA: Diagnosis not present

## 2024-07-26 DIAGNOSIS — N186 End stage renal disease: Secondary | ICD-10-CM | POA: Diagnosis not present

## 2024-07-26 DIAGNOSIS — N2581 Secondary hyperparathyroidism of renal origin: Secondary | ICD-10-CM | POA: Diagnosis not present

## 2024-07-28 DIAGNOSIS — Z992 Dependence on renal dialysis: Secondary | ICD-10-CM | POA: Diagnosis not present

## 2024-07-28 DIAGNOSIS — N186 End stage renal disease: Secondary | ICD-10-CM | POA: Diagnosis not present

## 2024-07-28 DIAGNOSIS — N2581 Secondary hyperparathyroidism of renal origin: Secondary | ICD-10-CM | POA: Diagnosis not present

## 2024-07-31 DIAGNOSIS — N2581 Secondary hyperparathyroidism of renal origin: Secondary | ICD-10-CM | POA: Diagnosis not present

## 2024-07-31 DIAGNOSIS — Z992 Dependence on renal dialysis: Secondary | ICD-10-CM | POA: Diagnosis not present

## 2024-07-31 DIAGNOSIS — N186 End stage renal disease: Secondary | ICD-10-CM | POA: Diagnosis not present

## 2024-08-02 DIAGNOSIS — N2581 Secondary hyperparathyroidism of renal origin: Secondary | ICD-10-CM | POA: Diagnosis not present

## 2024-08-02 DIAGNOSIS — N186 End stage renal disease: Secondary | ICD-10-CM | POA: Diagnosis not present

## 2024-08-02 DIAGNOSIS — Z992 Dependence on renal dialysis: Secondary | ICD-10-CM | POA: Diagnosis not present

## 2024-08-05 DIAGNOSIS — Z992 Dependence on renal dialysis: Secondary | ICD-10-CM | POA: Diagnosis not present

## 2024-08-05 DIAGNOSIS — N041 Nephrotic syndrome with focal and segmental glomerular lesions: Secondary | ICD-10-CM | POA: Diagnosis not present

## 2024-08-05 DIAGNOSIS — N186 End stage renal disease: Secondary | ICD-10-CM | POA: Diagnosis not present

## 2024-08-07 DIAGNOSIS — Z992 Dependence on renal dialysis: Secondary | ICD-10-CM | POA: Diagnosis not present

## 2024-08-07 DIAGNOSIS — N2581 Secondary hyperparathyroidism of renal origin: Secondary | ICD-10-CM | POA: Diagnosis not present

## 2024-08-07 DIAGNOSIS — N186 End stage renal disease: Secondary | ICD-10-CM | POA: Diagnosis not present

## 2024-08-09 DIAGNOSIS — Z992 Dependence on renal dialysis: Secondary | ICD-10-CM | POA: Diagnosis not present

## 2024-08-09 DIAGNOSIS — N186 End stage renal disease: Secondary | ICD-10-CM | POA: Diagnosis not present

## 2024-08-09 DIAGNOSIS — N2581 Secondary hyperparathyroidism of renal origin: Secondary | ICD-10-CM | POA: Diagnosis not present

## 2024-08-11 DIAGNOSIS — N2581 Secondary hyperparathyroidism of renal origin: Secondary | ICD-10-CM | POA: Diagnosis not present

## 2024-08-11 DIAGNOSIS — Z992 Dependence on renal dialysis: Secondary | ICD-10-CM | POA: Diagnosis not present

## 2024-08-11 DIAGNOSIS — N186 End stage renal disease: Secondary | ICD-10-CM | POA: Diagnosis not present

## 2024-08-14 DIAGNOSIS — N186 End stage renal disease: Secondary | ICD-10-CM | POA: Diagnosis not present

## 2024-08-14 DIAGNOSIS — Z992 Dependence on renal dialysis: Secondary | ICD-10-CM | POA: Diagnosis not present

## 2024-08-14 DIAGNOSIS — N2581 Secondary hyperparathyroidism of renal origin: Secondary | ICD-10-CM | POA: Diagnosis not present

## 2024-08-16 DIAGNOSIS — Z992 Dependence on renal dialysis: Secondary | ICD-10-CM | POA: Diagnosis not present

## 2024-08-16 DIAGNOSIS — N2581 Secondary hyperparathyroidism of renal origin: Secondary | ICD-10-CM | POA: Diagnosis not present

## 2024-08-16 DIAGNOSIS — N186 End stage renal disease: Secondary | ICD-10-CM | POA: Diagnosis not present

## 2024-08-18 DIAGNOSIS — N186 End stage renal disease: Secondary | ICD-10-CM | POA: Diagnosis not present

## 2024-08-18 DIAGNOSIS — N2581 Secondary hyperparathyroidism of renal origin: Secondary | ICD-10-CM | POA: Diagnosis not present

## 2024-08-18 DIAGNOSIS — Z992 Dependence on renal dialysis: Secondary | ICD-10-CM | POA: Diagnosis not present

## 2024-08-21 DIAGNOSIS — N186 End stage renal disease: Secondary | ICD-10-CM | POA: Diagnosis not present

## 2024-08-21 DIAGNOSIS — N2581 Secondary hyperparathyroidism of renal origin: Secondary | ICD-10-CM | POA: Diagnosis not present

## 2024-08-21 DIAGNOSIS — Z992 Dependence on renal dialysis: Secondary | ICD-10-CM | POA: Diagnosis not present

## 2024-08-21 NOTE — Progress Notes (Deleted)
 Subjective:  Patient ID: James [name], male    DOB: 07/28/1956, 68 y.o.   MRN: [id number]  Patient Care Team: Pcp, No as PCP - General Betsey Channel, MD as Consulting Physician (Nephrology) Center, [facility]   Chief Complaint:  No chief complaint on file.   HPI: James [name] is a 68 y.o. male presenting on 08/22/2024 for No chief complaint on file.   Discussed the use of AI scribe software for clinical note transcription with the patient, who gave verbal consent to proceed.  History of Present Illness       Relevant past medical, surgical, family, and social history reviewed and updated as indicated.  Allergies and medications reviewed and updated. Data reviewed: Chart in Epic.   Past Medical History:  Diagnosis Date   Asthma    as a child   CHF (congestive heart failure) (HCC)    Chronic kidney disease    TTHS   Diabetes mellitus without complication (HCC)    Type II   ESRD (end stage renal disease) (HCC)    Hypertension     Past Surgical History:  Procedure Laterality Date   AV FISTULA PLACEMENT Left 04/08/2015   Procedure: LEFT RADIOCEPHALIC ARTERIOVENOUS (AV) FISTULA CREATION;  Surgeon: Carlin FORBES Haddock, MD;  Location: [facility] OR;  Service: Vascular;  Laterality: Left;   AV FISTULA PLACEMENT Right 09/15/2015   Procedure: RADIOCEPHALIC VERSUS BRACHIOCEPHALIC ARTERIOVENOUS (AV) FISTULA CREATION - RIGHT ARM;  Surgeon: Redell LITTIE Door, MD;  Location: [facility] OR;  Service: Vascular;  Laterality: Right;   AV FISTULA PLACEMENT Left 06/07/2016   Procedure: LEFT BRACHIOCEPHALIC ARTERIOVENOUS (AV) FISTULA CREATION;  Surgeon: Carlin FORBES Haddock, MD;  Location: [facility] OR;  Service: Vascular;  Laterality: Left;   COLONOSCOPY     IR GENERIC HISTORICAL  08/04/2016   IR REMOVAL TUN CV CATH W/O FL 08/04/2016 Franky Rusk, PA-C [facility]-INTERV RAD   IR GENERIC HISTORICAL  08/06/2016   IR FLUORO GUIDE CV LINE LEFT 08/06/2016 Toribio Faes, MD [facility]   IR GENERIC HISTORICAL  08/06/2016    IR US  GUIDE VASC ACCESS LEFT 08/06/2016 Toribio Faes, MD [facility]   LIGATION OF ARTERIOVENOUS  FISTULA Left 09/15/2015   Procedure: LIGATION OF RADIOCEPHALIC ARTERIOVENOUS FISTULA - LEFT ARM;  Surgeon: Redell LITTIE Door, MD;  Location: [facility] OR;  Service: Vascular;  Laterality: Left;   LIGATION OF ARTERIOVENOUS  FISTULA Right 04/16/2016   Procedure: LIGATION OF ARTERIOVENOUS  FISTULA;  Surgeon: Carlin FORBES Haddock, MD;  Location: [facility] OR;  Service: Vascular;  Laterality: Right;   LIGATION OF COMPETING BRANCHES OF ARTERIOVENOUS FISTULA Left 07/16/2015   Procedure: LIGATION OF SIDE BRANCHES OF ARTERIOVENOUS FISTULA LEFT ARM;  Surgeon: Krystal JULIANNA Doing, MD;  Location: [facility] OR;  Service: Vascular;  Laterality: Left;   PERIPHERAL VASCULAR CATHETERIZATION N/A 07/04/2015   Procedure: Fistulagram;  Surgeon: Carlin FORBES Haddock, MD;  Location: [facility] INVASIVE CV LAB;  Service: Cardiovascular;  Laterality: N/A;   PERIPHERAL VASCULAR CATHETERIZATION N/A 09/08/2015   Procedure: Fistulagram;  Surgeon: Redell LITTIE Door, MD;  Location: [facility] INVASIVE CV LAB;  Service: Cardiovascular;  Laterality: N/A;   PERIPHERAL VASCULAR CATHETERIZATION Right 02/23/2016   Procedure: Fistulagram;  Surgeon: Redell LITTIE Door, MD;  Location: [facility] INVASIVE CV LAB;  Service: Cardiovascular;  Laterality: Right;   PERIPHERAL VASCULAR CATHETERIZATION N/A 04/12/2016   Procedure: Fistulagram;  Surgeon: Redell LITTIE Door, MD;  Location: [facility] INVASIVE CV LAB;  Service: Cardiovascular;  Laterality: N/A;   REVISON OF ARTERIOVENOUS FISTULA Right 03/10/2016  Procedure: RIGHT CEPHALIC VEIN TURNDOWN;  Surgeon: Redell LITTIE Door, MD;  Location: [facility] OR;  Service: Vascular;  Laterality: Right;   TEE WITHOUT CARDIOVERSION N/A 08/06/2016   Procedure: TRANSESOPHAGEAL ECHOCARDIOGRAM (TEE);  Surgeon: Salena Negri, MD;  Location: [facility] ENDOSCOPY;  Service: Cardiovascular;  Laterality: N/A;    Social History   Socioeconomic History   Marital status: Single    Spouse name: Not on file   Number of children:  Not on file   Years of education: Not on file   Highest education level: Not on file  Occupational History   Not on file  Tobacco Use   Smoking status: Former    Current packs/day: 0.00    Types: Cigarettes    Start date: 07/08/2004    Quit date: 07/08/2005    Years since quitting: 19.1   Smokeless tobacco: Never  Substance and Sexual Activity   Alcohol use: No    Alcohol/week: 0.0 standard drinks of alcohol   Drug use: No   Sexual activity: Not on file  Other Topics Concern   Not on file  Social History Narrative   Not on file   Social Drivers of Health   Financial Resource Strain: Not on file  Food Insecurity: Not on file  Transportation Needs: Not on file  Physical Activity: Not on file  Stress: Not on file  Social Connections: Not on file  Intimate Partner Violence: Not on file    Outpatient Encounter Medications as of 08/22/2024  Medication Sig   calcium  acetate (PHOSLO ) 667 MG capsule Take 2 capsules (1,334 mg total) by mouth 3 (three) times daily with meals.   Cholecalciferol (VITAMIN D3) 1.25 MG (50000 UT) CAPS Take 1 capsule by mouth once a week.   Darbepoetin Alfa  (ARANESP ) 100 MCG/0.5ML SOSY injection Inject 0.5 mLs (100 mcg total) into the vein every Thursday with hemodialysis. Will be given at dialysis.   glucose blood (ONETOUCH VERIO) test strip Test three times daily (Patient not taking: Reported on 02/10/2022)   isosorbide  mononitrate (IMDUR ) 60 MG 24 hr tablet Take 1 tablet (60 mg total) by mouth daily. (Patient not taking: Reported on 02/10/2022)   pantoprazole  (PROTONIX ) 20 MG tablet Take 1 tablet (20 mg total) by mouth daily. (Patient not taking: Reported on 02/10/2022)   No facility-administered encounter medications on file as of 08/22/2024.    No Known Allergies  Pertinent ROS per HPI, otherwise unremarkable      Objective:  There were no vitals taken for this visit.   Wt Readings from Last 3 Encounters:  02/10/22 196 lb 6.4 oz (89.1 kg)  01/15/22  192 lb 14.4 oz (87.5 kg)  07/16/19 199 lb 6.4 oz (90.4 kg)    Physical Exam Physical Exam      Results for orders placed or performed in visit on 02/10/22  BLADDER SCAN AMB NON-IMAGING   Collection Time: 02/10/22 11:51 AM  Result Value Ref Range   Scan Result 2        Pertinent labs & imaging results that were available during my care of the patient were reviewed by me and considered in my medical decision making.  Assessment & Plan:  There are no diagnoses linked to this encounter.   Assessment and Plan Assessment & Plan       Continue all other maintenance medications.  Follow up plan: No follow-ups on file.   Continue healthy lifestyle choices, including diet (rich in fruits, vegetables, and lean proteins, and low in salt and simple carbohydrates)  and exercise (at least 30 minutes of moderate physical activity daily).  Educational handout given for ***  The above assessment and management plan was discussed with the patient. The patient verbalized understanding of and has agreed to the management plan. Patient is aware to call the clinic if they develop any new symptoms or if symptoms persist or worsen. Patient is aware when to return to the clinic for a follow-up visit. Patient educated on when it is appropriate to go to the emergency department.  @SIGNATURE @

## 2024-08-22 ENCOUNTER — Ambulatory Visit: Admitting: Nurse Practitioner

## 2024-08-22 DIAGNOSIS — I10 Essential (primary) hypertension: Secondary | ICD-10-CM

## 2024-08-22 DIAGNOSIS — N189 Chronic kidney disease, unspecified: Secondary | ICD-10-CM

## 2024-08-22 DIAGNOSIS — R351 Nocturia: Secondary | ICD-10-CM

## 2024-08-22 DIAGNOSIS — E1122 Type 2 diabetes mellitus with diabetic chronic kidney disease: Secondary | ICD-10-CM

## 2024-08-22 DIAGNOSIS — Z0001 Encounter for general adult medical examination with abnormal findings: Secondary | ICD-10-CM

## 2024-08-23 DIAGNOSIS — N186 End stage renal disease: Secondary | ICD-10-CM | POA: Diagnosis not present

## 2024-08-23 DIAGNOSIS — N2581 Secondary hyperparathyroidism of renal origin: Secondary | ICD-10-CM | POA: Diagnosis not present

## 2024-08-23 DIAGNOSIS — Z992 Dependence on renal dialysis: Secondary | ICD-10-CM | POA: Diagnosis not present

## 2024-08-25 DIAGNOSIS — Z992 Dependence on renal dialysis: Secondary | ICD-10-CM | POA: Diagnosis not present

## 2024-08-25 DIAGNOSIS — N186 End stage renal disease: Secondary | ICD-10-CM | POA: Diagnosis not present

## 2024-08-25 DIAGNOSIS — N2581 Secondary hyperparathyroidism of renal origin: Secondary | ICD-10-CM | POA: Diagnosis not present

## 2024-08-28 DIAGNOSIS — Z992 Dependence on renal dialysis: Secondary | ICD-10-CM | POA: Diagnosis not present

## 2024-08-28 DIAGNOSIS — N186 End stage renal disease: Secondary | ICD-10-CM | POA: Diagnosis not present

## 2024-08-28 DIAGNOSIS — N2581 Secondary hyperparathyroidism of renal origin: Secondary | ICD-10-CM | POA: Diagnosis not present

## 2024-08-30 DIAGNOSIS — N2581 Secondary hyperparathyroidism of renal origin: Secondary | ICD-10-CM | POA: Diagnosis not present

## 2024-08-30 DIAGNOSIS — Z992 Dependence on renal dialysis: Secondary | ICD-10-CM | POA: Diagnosis not present

## 2024-08-30 DIAGNOSIS — N186 End stage renal disease: Secondary | ICD-10-CM | POA: Diagnosis not present

## 2024-09-01 DIAGNOSIS — N2581 Secondary hyperparathyroidism of renal origin: Secondary | ICD-10-CM | POA: Diagnosis not present

## 2024-09-01 DIAGNOSIS — Z992 Dependence on renal dialysis: Secondary | ICD-10-CM | POA: Diagnosis not present

## 2024-09-01 DIAGNOSIS — N186 End stage renal disease: Secondary | ICD-10-CM | POA: Diagnosis not present

## 2024-09-04 DIAGNOSIS — Z992 Dependence on renal dialysis: Secondary | ICD-10-CM | POA: Diagnosis not present

## 2024-09-04 DIAGNOSIS — N041 Nephrotic syndrome with focal and segmental glomerular lesions: Secondary | ICD-10-CM | POA: Diagnosis not present

## 2024-09-04 DIAGNOSIS — N186 End stage renal disease: Secondary | ICD-10-CM | POA: Diagnosis not present

## 2024-09-04 DIAGNOSIS — N2581 Secondary hyperparathyroidism of renal origin: Secondary | ICD-10-CM | POA: Diagnosis not present

## 2024-09-06 DIAGNOSIS — Z992 Dependence on renal dialysis: Secondary | ICD-10-CM | POA: Diagnosis not present

## 2024-09-06 DIAGNOSIS — N2581 Secondary hyperparathyroidism of renal origin: Secondary | ICD-10-CM | POA: Diagnosis not present

## 2024-09-06 DIAGNOSIS — E8779 Other fluid overload: Secondary | ICD-10-CM | POA: Diagnosis not present

## 2024-09-06 DIAGNOSIS — N186 End stage renal disease: Secondary | ICD-10-CM | POA: Diagnosis not present

## 2024-09-13 DIAGNOSIS — N186 End stage renal disease: Secondary | ICD-10-CM | POA: Diagnosis not present

## 2024-09-13 DIAGNOSIS — E8779 Other fluid overload: Secondary | ICD-10-CM | POA: Diagnosis not present

## 2024-09-13 DIAGNOSIS — N2581 Secondary hyperparathyroidism of renal origin: Secondary | ICD-10-CM | POA: Diagnosis not present

## 2024-09-13 DIAGNOSIS — Z992 Dependence on renal dialysis: Secondary | ICD-10-CM | POA: Diagnosis not present

## 2024-09-17 DIAGNOSIS — H00022 Hordeolum internum right lower eyelid: Secondary | ICD-10-CM | POA: Diagnosis not present

## 2024-09-17 DIAGNOSIS — L089 Local infection of the skin and subcutaneous tissue, unspecified: Secondary | ICD-10-CM | POA: Diagnosis not present

## 2024-09-17 DIAGNOSIS — H1130 Conjunctival hemorrhage, unspecified eye: Secondary | ICD-10-CM | POA: Diagnosis not present

## 2024-09-17 DIAGNOSIS — S1092XA Blister (nonthermal) of unspecified part of neck, initial encounter: Secondary | ICD-10-CM | POA: Diagnosis not present

## 2024-09-17 DIAGNOSIS — S0082XA Blister (nonthermal) of other part of head, initial encounter: Secondary | ICD-10-CM | POA: Diagnosis not present

## 2024-09-17 DIAGNOSIS — R238 Other skin changes: Secondary | ICD-10-CM | POA: Diagnosis not present

## 2024-09-17 DIAGNOSIS — H5711 Ocular pain, right eye: Secondary | ICD-10-CM | POA: Diagnosis not present

## 2024-09-17 DIAGNOSIS — H1031 Unspecified acute conjunctivitis, right eye: Secondary | ICD-10-CM | POA: Diagnosis not present

## 2024-09-17 DIAGNOSIS — S0002XA Blister (nonthermal) of scalp, initial encounter: Secondary | ICD-10-CM | POA: Diagnosis not present

## 2024-09-20 DIAGNOSIS — N2581 Secondary hyperparathyroidism of renal origin: Secondary | ICD-10-CM | POA: Diagnosis not present

## 2024-09-20 DIAGNOSIS — E8779 Other fluid overload: Secondary | ICD-10-CM | POA: Diagnosis not present

## 2024-09-20 DIAGNOSIS — Z992 Dependence on renal dialysis: Secondary | ICD-10-CM | POA: Diagnosis not present

## 2024-09-20 DIAGNOSIS — N186 End stage renal disease: Secondary | ICD-10-CM | POA: Diagnosis not present

## 2024-09-21 DIAGNOSIS — N186 End stage renal disease: Secondary | ICD-10-CM | POA: Diagnosis not present

## 2024-09-25 DIAGNOSIS — E8779 Other fluid overload: Secondary | ICD-10-CM | POA: Diagnosis not present

## 2024-09-25 DIAGNOSIS — N186 End stage renal disease: Secondary | ICD-10-CM | POA: Diagnosis not present

## 2024-09-25 DIAGNOSIS — Z992 Dependence on renal dialysis: Secondary | ICD-10-CM | POA: Diagnosis not present

## 2024-09-29 DIAGNOSIS — E8779 Other fluid overload: Secondary | ICD-10-CM | POA: Diagnosis not present

## 2024-09-29 DIAGNOSIS — Z992 Dependence on renal dialysis: Secondary | ICD-10-CM | POA: Diagnosis not present

## 2024-09-29 DIAGNOSIS — N2581 Secondary hyperparathyroidism of renal origin: Secondary | ICD-10-CM | POA: Diagnosis not present

## 2024-09-29 DIAGNOSIS — N186 End stage renal disease: Secondary | ICD-10-CM | POA: Diagnosis not present

## 2024-10-02 DIAGNOSIS — N186 End stage renal disease: Secondary | ICD-10-CM | POA: Diagnosis not present

## 2024-10-02 DIAGNOSIS — Z992 Dependence on renal dialysis: Secondary | ICD-10-CM | POA: Diagnosis not present

## 2024-10-02 DIAGNOSIS — N2581 Secondary hyperparathyroidism of renal origin: Secondary | ICD-10-CM | POA: Diagnosis not present

## 2024-10-02 DIAGNOSIS — E8779 Other fluid overload: Secondary | ICD-10-CM | POA: Diagnosis not present

## 2024-10-05 DIAGNOSIS — Z992 Dependence on renal dialysis: Secondary | ICD-10-CM | POA: Diagnosis not present

## 2024-10-05 DIAGNOSIS — N041 Nephrotic syndrome with focal and segmental glomerular lesions: Secondary | ICD-10-CM | POA: Diagnosis not present

## 2024-10-05 DIAGNOSIS — N186 End stage renal disease: Secondary | ICD-10-CM | POA: Diagnosis not present

## 2024-10-06 DIAGNOSIS — N186 End stage renal disease: Secondary | ICD-10-CM | POA: Diagnosis not present

## 2024-10-06 DIAGNOSIS — N2581 Secondary hyperparathyroidism of renal origin: Secondary | ICD-10-CM | POA: Diagnosis not present

## 2024-10-06 DIAGNOSIS — Z992 Dependence on renal dialysis: Secondary | ICD-10-CM | POA: Diagnosis not present

## 2024-10-11 DIAGNOSIS — N186 End stage renal disease: Secondary | ICD-10-CM | POA: Diagnosis not present

## 2024-10-16 DIAGNOSIS — N186 End stage renal disease: Secondary | ICD-10-CM | POA: Diagnosis not present

## 2024-10-18 DIAGNOSIS — N186 End stage renal disease: Secondary | ICD-10-CM | POA: Diagnosis not present

## 2024-10-18 DIAGNOSIS — N2581 Secondary hyperparathyroidism of renal origin: Secondary | ICD-10-CM | POA: Diagnosis not present

## 2024-10-18 DIAGNOSIS — Z992 Dependence on renal dialysis: Secondary | ICD-10-CM | POA: Diagnosis not present

## 2024-10-20 DIAGNOSIS — Z992 Dependence on renal dialysis: Secondary | ICD-10-CM | POA: Diagnosis not present

## 2024-10-20 DIAGNOSIS — N186 End stage renal disease: Secondary | ICD-10-CM | POA: Diagnosis not present

## 2024-10-20 DIAGNOSIS — N2581 Secondary hyperparathyroidism of renal origin: Secondary | ICD-10-CM | POA: Diagnosis not present

## 2024-10-31 DIAGNOSIS — Z992 Dependence on renal dialysis: Secondary | ICD-10-CM | POA: Diagnosis not present

## 2024-10-31 DIAGNOSIS — N2581 Secondary hyperparathyroidism of renal origin: Secondary | ICD-10-CM | POA: Diagnosis not present

## 2024-11-03 DIAGNOSIS — Z992 Dependence on renal dialysis: Secondary | ICD-10-CM | POA: Diagnosis not present

## 2024-11-03 DIAGNOSIS — N2581 Secondary hyperparathyroidism of renal origin: Secondary | ICD-10-CM | POA: Diagnosis not present

## 2024-11-04 DIAGNOSIS — N041 Nephrotic syndrome with focal and segmental glomerular lesions: Secondary | ICD-10-CM | POA: Diagnosis not present

## 2024-11-04 DIAGNOSIS — Z992 Dependence on renal dialysis: Secondary | ICD-10-CM | POA: Diagnosis not present

## 2024-11-04 DIAGNOSIS — N186 End stage renal disease: Secondary | ICD-10-CM | POA: Diagnosis not present

## 2024-11-20 DIAGNOSIS — N186 End stage renal disease: Secondary | ICD-10-CM | POA: Diagnosis not present

## 2024-12-21 NOTE — Progress Notes (Signed)
 James [name]                                          MRN: [id number]   12/21/2024   The [facility] Quality Team Specialist reviewed this patient medical record for the purposes of chart review for care gap closure. The following were reviewed: chart review for care gap closure-care for older adult medication review.    [facility] Quality Team
